# Patient Record
Sex: Female | Born: 1965
Health system: Southern US, Community
[De-identification: ages and names within clinical notes are randomized; demographics above are authoritative.]

## PROBLEM LIST (undated history)

## (undated) DIAGNOSIS — Z8669 Personal history of other diseases of the nervous system and sense organs: Secondary | ICD-10-CM

## (undated) DIAGNOSIS — G459 Transient cerebral ischemic attack, unspecified: Secondary | ICD-10-CM

## (undated) DIAGNOSIS — D649 Anemia, unspecified: Secondary | ICD-10-CM

## (undated) DIAGNOSIS — F329 Major depressive disorder, single episode, unspecified: Secondary | ICD-10-CM

## (undated) DIAGNOSIS — T7840XA Allergy, unspecified, initial encounter: Secondary | ICD-10-CM

## (undated) DIAGNOSIS — J189 Pneumonia, unspecified organism: Secondary | ICD-10-CM

## (undated) DIAGNOSIS — Z8489 Family history of other specified conditions: Secondary | ICD-10-CM

## (undated) DIAGNOSIS — F32A Depression, unspecified: Secondary | ICD-10-CM

## (undated) DIAGNOSIS — F419 Anxiety disorder, unspecified: Secondary | ICD-10-CM

## (undated) DIAGNOSIS — C801 Malignant (primary) neoplasm, unspecified: Secondary | ICD-10-CM

## (undated) DIAGNOSIS — L209 Atopic dermatitis, unspecified: Secondary | ICD-10-CM

## (undated) DIAGNOSIS — K219 Gastro-esophageal reflux disease without esophagitis: Secondary | ICD-10-CM

## (undated) DIAGNOSIS — Z9221 Personal history of antineoplastic chemotherapy: Secondary | ICD-10-CM

## (undated) DIAGNOSIS — N6019 Diffuse cystic mastopathy of unspecified breast: Secondary | ICD-10-CM

## (undated) HISTORY — DX: Allergy, unspecified, initial encounter: T78.40XA

## (undated) HISTORY — DX: Gastro-esophageal reflux disease without esophagitis: K21.9

## (undated) HISTORY — DX: Diffuse cystic mastopathy of unspecified breast: N60.19

## (undated) HISTORY — DX: Anemia, unspecified: D64.9

## (undated) HISTORY — DX: Atopic dermatitis, unspecified: L20.9

---

## 1999-09-30 ENCOUNTER — Other Ambulatory Visit: Admission: RE | Admit: 1999-09-30 | Discharge: 1999-09-30 | Payer: Self-pay | Admitting: Family Medicine

## 2000-08-30 HISTORY — PX: MYOMECTOMY: SHX85

## 2001-04-19 ENCOUNTER — Other Ambulatory Visit: Admission: RE | Admit: 2001-04-19 | Discharge: 2001-04-19 | Payer: Self-pay | Admitting: Family Medicine

## 2001-09-14 ENCOUNTER — Inpatient Hospital Stay (HOSPITAL_COMMUNITY): Admission: RE | Admit: 2001-09-14 | Discharge: 2001-09-16 | Payer: Self-pay | Admitting: Obstetrics and Gynecology

## 2001-09-14 ENCOUNTER — Encounter (INDEPENDENT_AMBULATORY_CARE_PROVIDER_SITE_OTHER): Payer: Self-pay | Admitting: Specialist

## 2002-04-27 ENCOUNTER — Other Ambulatory Visit: Admission: RE | Admit: 2002-04-27 | Discharge: 2002-04-27 | Payer: Self-pay | Admitting: Family Medicine

## 2003-07-24 ENCOUNTER — Other Ambulatory Visit: Admission: RE | Admit: 2003-07-24 | Discharge: 2003-07-24 | Payer: Self-pay | Admitting: Family Medicine

## 2003-11-06 ENCOUNTER — Encounter: Admission: RE | Admit: 2003-11-06 | Discharge: 2003-11-06 | Payer: Self-pay | Admitting: Family Medicine

## 2004-05-10 ENCOUNTER — Emergency Department (HOSPITAL_COMMUNITY): Admission: EM | Admit: 2004-05-10 | Discharge: 2004-05-10 | Payer: Self-pay | Admitting: Emergency Medicine

## 2004-08-16 ENCOUNTER — Other Ambulatory Visit: Admission: RE | Admit: 2004-08-16 | Discharge: 2004-08-16 | Payer: Self-pay | Admitting: Family Medicine

## 2004-08-17 ENCOUNTER — Other Ambulatory Visit: Admission: RE | Admit: 2004-08-17 | Discharge: 2004-08-17 | Payer: Self-pay | Admitting: Family Medicine

## 2004-08-17 ENCOUNTER — Ambulatory Visit: Payer: Self-pay | Admitting: Family Medicine

## 2004-12-28 ENCOUNTER — Ambulatory Visit: Payer: Self-pay | Admitting: Internal Medicine

## 2005-01-08 ENCOUNTER — Ambulatory Visit: Payer: Self-pay | Admitting: Family Medicine

## 2005-04-26 ENCOUNTER — Ambulatory Visit: Payer: Self-pay | Admitting: Family Medicine

## 2005-04-28 ENCOUNTER — Ambulatory Visit: Payer: Self-pay | Admitting: Family Medicine

## 2005-06-07 ENCOUNTER — Ambulatory Visit: Payer: Self-pay | Admitting: Family Medicine

## 2005-11-03 ENCOUNTER — Encounter: Payer: Self-pay | Admitting: Family Medicine

## 2005-11-03 ENCOUNTER — Other Ambulatory Visit: Admission: RE | Admit: 2005-11-03 | Discharge: 2005-11-03 | Payer: Self-pay | Admitting: Family Medicine

## 2005-11-03 ENCOUNTER — Ambulatory Visit: Payer: Self-pay | Admitting: Family Medicine

## 2005-11-09 ENCOUNTER — Ambulatory Visit: Payer: Self-pay | Admitting: Family Medicine

## 2005-11-12 ENCOUNTER — Ambulatory Visit: Payer: Self-pay | Admitting: Family Medicine

## 2006-02-10 ENCOUNTER — Ambulatory Visit: Payer: Self-pay | Admitting: Family Medicine

## 2006-02-23 ENCOUNTER — Encounter: Payer: Self-pay | Admitting: Family Medicine

## 2006-03-23 ENCOUNTER — Encounter: Payer: Self-pay | Admitting: Family Medicine

## 2006-03-30 ENCOUNTER — Encounter: Admission: RE | Admit: 2006-03-30 | Discharge: 2006-03-30 | Payer: Self-pay | Admitting: Surgery

## 2006-03-30 ENCOUNTER — Encounter: Payer: Self-pay | Admitting: Family Medicine

## 2006-12-02 ENCOUNTER — Ambulatory Visit: Payer: Self-pay | Admitting: Family Medicine

## 2008-10-15 ENCOUNTER — Encounter: Payer: Self-pay | Admitting: Family Medicine

## 2008-10-15 ENCOUNTER — Other Ambulatory Visit: Admission: RE | Admit: 2008-10-15 | Discharge: 2008-10-15 | Payer: Self-pay | Admitting: Family Medicine

## 2008-10-15 ENCOUNTER — Ambulatory Visit: Payer: Self-pay | Admitting: Family Medicine

## 2008-10-15 DIAGNOSIS — M79609 Pain in unspecified limb: Secondary | ICD-10-CM | POA: Insufficient documentation

## 2008-10-15 DIAGNOSIS — Z87898 Personal history of other specified conditions: Secondary | ICD-10-CM | POA: Insufficient documentation

## 2008-10-15 DIAGNOSIS — L723 Sebaceous cyst: Secondary | ICD-10-CM | POA: Insufficient documentation

## 2008-10-15 DIAGNOSIS — R109 Unspecified abdominal pain: Secondary | ICD-10-CM | POA: Insufficient documentation

## 2008-10-15 DIAGNOSIS — L2089 Other atopic dermatitis: Secondary | ICD-10-CM | POA: Insufficient documentation

## 2008-10-15 LAB — CONVERTED CEMR LAB
Bilirubin Urine: NEGATIVE
Glucose, Urine, Semiquant: NEGATIVE
Ketones, urine, test strip: NEGATIVE
Nitrite: NEGATIVE
Protein, U semiquant: NEGATIVE
Specific Gravity, Urine: 1.01
Urobilinogen, UA: NEGATIVE
WBC Urine, dipstick: NEGATIVE
pH: 6

## 2008-10-16 ENCOUNTER — Encounter (INDEPENDENT_AMBULATORY_CARE_PROVIDER_SITE_OTHER): Payer: Self-pay | Admitting: *Deleted

## 2008-10-16 ENCOUNTER — Encounter: Payer: Self-pay | Admitting: Family Medicine

## 2008-10-22 ENCOUNTER — Encounter: Payer: Self-pay | Admitting: Family Medicine

## 2008-10-29 ENCOUNTER — Telehealth (INDEPENDENT_AMBULATORY_CARE_PROVIDER_SITE_OTHER): Payer: Self-pay | Admitting: *Deleted

## 2008-10-29 ENCOUNTER — Encounter: Payer: Self-pay | Admitting: Family Medicine

## 2008-10-29 ENCOUNTER — Encounter (INDEPENDENT_AMBULATORY_CARE_PROVIDER_SITE_OTHER): Payer: Self-pay | Admitting: *Deleted

## 2008-10-29 LAB — CONVERTED CEMR LAB
ALT: 15 units/L (ref 0–35)
AST: 21 units/L (ref 0–37)
Albumin: 4.2 g/dL (ref 3.5–5.2)
Alkaline Phosphatase: 31 units/L — ABNORMAL LOW (ref 39–117)
BUN: 9 mg/dL (ref 6–23)
Basophils Absolute: 0 10*3/uL (ref 0.0–0.1)
Basophils Relative: 0.3 % (ref 0.0–3.0)
Bilirubin, Direct: 0.1 mg/dL (ref 0.0–0.3)
CO2: 26 meq/L (ref 19–32)
Calcium: 8.8 mg/dL (ref 8.4–10.5)
Chloride: 104 meq/L (ref 96–112)
Cholesterol: 140 mg/dL (ref 0–200)
Creatinine, Ser: 0.7 mg/dL (ref 0.4–1.2)
Eosinophils Absolute: 0.1 10*3/uL (ref 0.0–0.7)
Eosinophils Relative: 2.7 % (ref 0.0–5.0)
GFR calc Af Amer: 119 mL/min
GFR calc non Af Amer: 98 mL/min
Glucose, Bld: 81 mg/dL (ref 70–99)
HCT: 40.1 % (ref 36.0–46.0)
HDL: 50 mg/dL (ref >39.0)
Hemoglobin: 14 g/dL (ref 12.0–15.0)
LDL Cholesterol: 82 mg/dL (ref 0–99)
Lymphocytes Relative: 81.3 % — ABNORMAL HIGH (ref 12.0–46.0)
MCHC: 35 g/dL (ref 30.0–36.0)
MCV: 85.6 fL (ref 78.0–100.0)
Monocytes Absolute: 0.4 10*3/uL (ref 0.1–1.0)
Monocytes Relative: 8.8 % (ref 3.0–12.0)
Neutro Abs: 0.3 10*3/uL — ABNORMAL LOW (ref 1.4–7.7)
Neutrophils Relative %: 6.9 % — ABNORMAL LOW (ref 43.0–77.0)
Platelets: 212 10*3/uL (ref 150–400)
Potassium: 4 meq/L (ref 3.5–5.1)
RBC: 4.68 M/uL (ref 3.87–5.11)
RDW: 12.6 % (ref 11.5–14.6)
Sodium: 137 meq/L (ref 135–145)
TSH: 2.3 microintl units/mL (ref 0.35–5.50)
Total Bilirubin: 0.8 mg/dL (ref 0.3–1.2)
Total CHOL/HDL Ratio: 2.8
Total Protein: 7.3 g/dL (ref 6.0–8.3)
Triglycerides: 41 mg/dL (ref 0–149)
VLDL: 8 mg/dL (ref 0–40)
WBC: 4.4 10*3/uL — ABNORMAL LOW (ref 4.5–10.5)

## 2008-10-30 ENCOUNTER — Telehealth (INDEPENDENT_AMBULATORY_CARE_PROVIDER_SITE_OTHER): Payer: Self-pay | Admitting: *Deleted

## 2008-10-31 ENCOUNTER — Telehealth: Payer: Self-pay | Admitting: Family Medicine

## 2008-10-31 DIAGNOSIS — R928 Other abnormal and inconclusive findings on diagnostic imaging of breast: Secondary | ICD-10-CM | POA: Insufficient documentation

## 2008-11-18 ENCOUNTER — Telehealth: Payer: Self-pay | Admitting: Family Medicine

## 2009-02-11 ENCOUNTER — Ambulatory Visit: Payer: Self-pay | Admitting: Family Medicine

## 2009-02-11 DIAGNOSIS — M25539 Pain in unspecified wrist: Secondary | ICD-10-CM | POA: Insufficient documentation

## 2009-02-13 ENCOUNTER — Telehealth (INDEPENDENT_AMBULATORY_CARE_PROVIDER_SITE_OTHER): Payer: Self-pay | Admitting: *Deleted

## 2009-02-13 ENCOUNTER — Ambulatory Visit: Payer: Self-pay | Admitting: Family Medicine

## 2009-10-14 ENCOUNTER — Other Ambulatory Visit: Admission: RE | Admit: 2009-10-14 | Discharge: 2009-10-14 | Payer: Self-pay | Admitting: Family Medicine

## 2009-10-14 ENCOUNTER — Ambulatory Visit: Payer: Self-pay | Admitting: Family Medicine

## 2009-10-14 ENCOUNTER — Encounter (INDEPENDENT_AMBULATORY_CARE_PROVIDER_SITE_OTHER): Payer: Self-pay | Admitting: *Deleted

## 2009-10-15 ENCOUNTER — Encounter (INDEPENDENT_AMBULATORY_CARE_PROVIDER_SITE_OTHER): Payer: Self-pay | Admitting: *Deleted

## 2009-10-16 ENCOUNTER — Encounter (INDEPENDENT_AMBULATORY_CARE_PROVIDER_SITE_OTHER): Payer: Self-pay | Admitting: *Deleted

## 2009-10-24 ENCOUNTER — Ambulatory Visit: Payer: Self-pay | Admitting: Family Medicine

## 2009-10-25 LAB — CONVERTED CEMR LAB: Fecal Occult Bld: NEGATIVE

## 2009-11-18 ENCOUNTER — Encounter: Payer: Self-pay | Admitting: Family Medicine

## 2009-12-08 ENCOUNTER — Ambulatory Visit: Payer: Self-pay | Admitting: Family Medicine

## 2009-12-08 DIAGNOSIS — N979 Female infertility, unspecified: Secondary | ICD-10-CM | POA: Insufficient documentation

## 2009-12-30 ENCOUNTER — Ambulatory Visit: Payer: Self-pay | Admitting: Family Medicine

## 2009-12-30 DIAGNOSIS — N926 Irregular menstruation, unspecified: Secondary | ICD-10-CM | POA: Insufficient documentation

## 2009-12-30 DIAGNOSIS — IMO0001 Reserved for inherently not codable concepts without codable children: Secondary | ICD-10-CM | POA: Insufficient documentation

## 2009-12-30 LAB — CONVERTED CEMR LAB
Beta hcg, urine, semiquantitative: NEGATIVE
Bilirubin Urine: NEGATIVE
Glucose, Urine, Semiquant: NEGATIVE
Ketones, urine, test strip: NEGATIVE
Nitrite: NEGATIVE
Protein, U semiquant: NEGATIVE
Specific Gravity, Urine: 1.02
Urobilinogen, UA: 0.2
WBC Urine, dipstick: NEGATIVE
pH: 6.5

## 2010-01-19 ENCOUNTER — Telehealth (INDEPENDENT_AMBULATORY_CARE_PROVIDER_SITE_OTHER): Payer: Self-pay | Admitting: *Deleted

## 2010-01-20 ENCOUNTER — Ambulatory Visit: Payer: Self-pay | Admitting: Oncology

## 2010-02-04 ENCOUNTER — Encounter: Payer: Self-pay | Admitting: Family Medicine

## 2010-09-20 ENCOUNTER — Encounter: Payer: Self-pay | Admitting: Radiology

## 2010-09-27 LAB — CONVERTED CEMR LAB
ALT: 19 units/L (ref 0–35)
AST: 16 units/L (ref 0–37)
Albumin: 4.1 g/dL (ref 3.5–5.2)
Alkaline Phosphatase: 34 units/L — ABNORMAL LOW (ref 39–117)
BUN: 6 mg/dL (ref 6–23)
Basophils Absolute: 0 10*3/uL (ref 0.0–0.1)
Basophils Relative: 0.6 % (ref 0.0–3.0)
Bilirubin Urine: NEGATIVE
Bilirubin, Direct: 0 mg/dL (ref 0.0–0.3)
CO2: 30 meq/L (ref 19–32)
Calcium: 9 mg/dL (ref 8.4–10.5)
Chloride: 107 meq/L (ref 96–112)
Cholesterol: 141 mg/dL (ref 0–200)
Creatinine, Ser: 0.8 mg/dL (ref 0.4–1.2)
Eosinophils Absolute: 0.1 10*3/uL (ref 0.0–0.7)
Eosinophils Relative: 1.7 % (ref 0.0–5.0)
GFR calc non Af Amer: 100.89 mL/min (ref >60)
Glucose, Bld: 86 mg/dL (ref 70–99)
Glucose, Urine, Semiquant: NEGATIVE
HCT: 42.4 % (ref 36.0–46.0)
HDL: 55.1 mg/dL (ref >39.00)
Hemoglobin: 14 g/dL (ref 12.0–15.0)
Hgb A1c MFr Bld: 5.5 % (ref 4.6–6.5)
Ketones, urine, test strip: NEGATIVE
LDL Cholesterol: 76 mg/dL (ref 0–99)
Lymphocytes Relative: 57.7 % — ABNORMAL HIGH (ref 12.0–46.0)
Lymphs Abs: 3.5 10*3/uL (ref 0.7–4.0)
MCHC: 33 g/dL (ref 30.0–36.0)
MCV: 88.4 fL (ref 78.0–100.0)
Monocytes Absolute: 0.2 10*3/uL (ref 0.1–1.0)
Monocytes Relative: 3.6 % (ref 3.0–12.0)
Neutro Abs: 2.2 10*3/uL (ref 1.4–7.7)
Neutrophils Relative %: 36.4 % — ABNORMAL LOW (ref 43.0–77.0)
Nitrite: NEGATIVE
Pap Smear: NEGATIVE
Platelets: 222 10*3/uL (ref 150.0–400.0)
Potassium: 4 meq/L (ref 3.5–5.1)
Protein, U semiquant: NEGATIVE
RBC: 4.8 M/uL (ref 3.87–5.11)
RDW: 12.3 % (ref 11.5–14.6)
Sodium: 139 meq/L (ref 135–145)
Specific Gravity, Urine: <1.005
TSH: 2.35 microintl units/mL (ref 0.35–5.50)
Total Bilirubin: 0.5 mg/dL (ref 0.3–1.2)
Total CHOL/HDL Ratio: 3
Total Protein: 6.9 g/dL (ref 6.0–8.3)
Triglycerides: 48 mg/dL (ref 0.0–149.0)
Urobilinogen, UA: NEGATIVE
VLDL: 9.6 mg/dL (ref 0.0–40.0)
WBC Urine, dipstick: NEGATIVE
WBC: 6 10*3/uL (ref 4.5–10.5)
pH: 5

## 2010-09-29 NOTE — Letter (Signed)
Summary: [facility]   Imported By: Lanelle Bal 11/04/2008 08:45:46  _____________________________________________________________________  External Attachment:    Type:   Image     Comment:   External Document

## 2010-09-29 NOTE — Letter (Signed)
Summary: [facility]   Imported By: Lanelle Bal 11/04/2008 08:44:24  _____________________________________________________________________  External Attachment:    Type:   Image     Comment:   External Document

## 2010-09-29 NOTE — Letter (Signed)
Summary: Primary Care Consult Scheduled Letter  [place] [facility] [place]  [address] [place], [place] [address]   Phone: [phone]  Fax: [phone]      10/15/2009 MRN: [id number]  [facility] [address] [place], [place]  [address]    Dear Ms. Scicchitano,    We have scheduled an appointment for you.  At the recommendation of Dr. Loreen Freud, we have scheduled you for a Screening Mammogram with [facility] on 10-28-2009 at 7:45am.  Their address is [address], [place]. The office phone number is [phone].  If this appointment day and time is not convenient for you, please feel free to call the office of the doctor you are being referred to at the number listed above and reschedule the appointment.    It is important for you to keep your scheduled appointments. We are here to make sure you are given good patient care.   Thank you,    Renee, Patient Care Coordinator [place] at [facility]

## 2010-09-29 NOTE — Assessment & Plan Note (Signed)
Summary: hip & leg pain before menus cycle/cbs   Vital Signs:  Patient profile:   45 year old female Weight:      156 pounds Pulse rate:   82 / minute Pulse rhythm:   regular BP sitting:   126 / 80  (left arm) Cuff size:   regular  Vitals Entered By: Army Fossa CMA (Dec 30, 2009 11:17 AM) CC: Pt here she c/o cycles being 2-3 days late. Her legs and and thighs have felt very sore.    History of Present Illness: Pt here c/o period being 2 1/2 days late 2 months in a row.  Pt also c/o leg and hip aching that started last night.    Current Medications (verified): 1)  None  Allergies: 1)  ! Pcn  Past History:  Past medical, surgical, family and social histories (including risk factors) reviewed for relevance to current acute and chronic problems.  Past Medical History: Reviewed history from 10/15/2008 and no changes required. Current Problems:  FIBROCYSTIC BREAST DISEASE, HX OF (ICD-V13.9) PREVENTIVE HEALTH CARE (ICD-V70.0) ECZEMA, ATOPIC (ICD-691.8)  Past Surgical History: Reviewed history from 10/15/2008 and no changes required. myomectomy-2002  Family History: Reviewed history from 10/14/2009 and no changes required. Mother-GOUT FATHER-HTN, BORDERLINE DIABETIC Family History Diabetes 1st degree relative Family History Hypertension PGreat Aunt--Breast Cancer  Social History: Reviewed history from 10/14/2009 and no changes required. 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  Alcohol use-no Drug use-no  Review of Systems      See HPI  Physical Exam  General:  Well-developed,well-nourished,in no acute distress; alert,appropriate and cooperative throughout examination Psych:  Oriented X3 and normally interactive.     Impression & Recommendations:  Problem # 1:  IRREGULAR MENSTRUAL CYCLE (ICD-626.4)  Orders: Venipuncture (29562) TLB-BMP (Basic Metabolic Panel-BMET) (80048-METABOL) TLB-CBC Platelet - w/Differential  (85025-CBCD) TLB-TSH (Thyroid Stimulating Hormone) (84443-TSH) TLB-Hepatic/Liver Function Pnl (80076-HEPATIC) T-Vitamin D (25-Hydroxy) (13086-57846)  Discussed medication use.   Problem # 2:  MUSCLE PAIN (ICD-729.1)  Orders: Venipuncture (96295) TLB-BMP (Basic Metabolic Panel-BMET) (80048-METABOL) TLB-CBC Platelet - w/Differential (85025-CBCD) TLB-TSH (Thyroid Stimulating Hormone) (84443-TSH) TLB-Hepatic/Liver Function Pnl (80076-HEPATIC) T-Vitamin D (25-Hydroxy) (28413-24401)  Laboratory Results   Urine Tests    Routine Urinalysis   Color: yellow Appearance: Clear Glucose: negative   (Normal Range: Negative) Bilirubin: negative   (Normal Range: Negative) Ketone: negative   (Normal Range: Negative) Spec. Gravity: 1.020   (Normal Range: 1.003-1.035) Blood: large   (Normal Range: Negative) pH: 6.5   (Normal Range: 5.0-8.0) Protein: negative   (Normal Range: Negative) Urobilinogen: 0.2   (Normal Range: 0-1) Nitrite: negative   (Normal Range: Negative) Leukocyte Esterace: negative   (Normal Range: Negative)    Urine HCG: negative Comments: Army Fossa CMA  Dec 30, 2009 11:27 AM, Pt is on period.

## 2010-09-29 NOTE — Progress Notes (Signed)
Summary: mammogram  Phone Note Outgoing Call   Call placed by: Army Fossa CMA,  Jan 19, 2010 11:12 AM Summary of Call: Regarding mammogram, LMTCB:  call pt ---she needs to call [facility] back Signed by Loreen Freud DO on 01/19/2010 at 10:24 AM  Follow-up for Phone Call        [facility]. Army Fossa CMA  Jan 20, 2010 1:20 PM   Additional Follow-up for Phone Call Additional follow up Details #1::        Pt is aware. Army Fossa CMA  Jan 21, 2010 9:22 AM

## 2010-09-29 NOTE — Assessment & Plan Note (Signed)
Summary: wrist hurting/mu   Vital Signs:  Patient profile:   45 year old female Weight:      160.13 pounds Temp:     98.3 degrees F oral Pulse rate:   72 / minute Resp:     16 per minute BP sitting:   112 / 80  (left arm)  Vitals Entered By: Ardyth Man (February 11, 2009 10:56 AM) CC: right wrist hurting since Wednesday Is Patient Diabetic? No  Have you ever been in a relationship where you felt threatened, hurt or afraid?No   Does patient need assistance? Functional Status Self care Ambulation Normal   History of Present Illness: Pt here c/o R wrist pain for a while.  Pt has had multiple injuries over the last year or so. Pt got out her wrist splint and started wearing it last week.  It is better now.    Current Medications (verified): 1)  Naprosyn 500 Mg Tabs (Naproxen) .Marland Kitchen.. 1 By Mouth Two Times A Day  Allergies (verified): 1)  ! Pcn  Past History:  Past medical, surgical, family and social histories (including risk factors) reviewed, and no changes noted (except as noted below).  Past Medical History: Reviewed history from 10/15/2008 and no changes required. Current Problems:  FIBROCYSTIC BREAST DISEASE, HX OF (ICD-V13.9) PREVENTIVE HEALTH CARE (ICD-V70.0) ECZEMA, ATOPIC (ICD-691.8)  Past Surgical History: Reviewed history from 10/15/2008 and no changes required. myomectomy-2002  Family History: Reviewed history from 10/15/2008 and no changes required. Mother-GOUT FATHER-HTN, BORDERLINE DIABETIC  Social History: Reviewed history from 10/15/2008 and no changes required. 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  NON SMOKER NON DRINKER NON DRUG USER  Review of Systems      See HPI  Physical Exam  General:  Well-developed,well-nourished,in no acute distress; alert,appropriate and cooperative throughout examination Msk:  normal ROM, no joint swelling, no joint warmth, no redness over joints, no joint deformities, and  joint tenderness.   Pulses:  R radial normal and L radial normal.   Skin:  Intact without suspicious lesions or rashes Cervical Nodes:  No lymphadenopathy noted Psych:  Cognition and judgment appear intact. Alert and cooperative with normal attention span and concentration. No apparent delusions, illusions, hallucinations   Impression & Recommendations:  Problem # 1:  WRIST PAIN, RIGHT (ICD-719.43) naprosyn 500 two times a day  con't splint refer ortho if no better Orders: T-Wrist Comp Right (73110TC)  Complete Medication List: 1)  Naprosyn 500 Mg Tabs (Naproxen) .Marland Kitchen.. 1 by mouth two times a day Prescriptions: NAPROSYN 500 MG TABS (NAPROXEN) 1 by mouth two times a day  #30 x 1   Entered and Authorized by:   Loreen Freud DO   Signed by:   Loreen Freud DO on 02/11/2009   Method used:   Print then Give to Patient   RxID:   [id number]

## 2010-09-29 NOTE — Assessment & Plan Note (Signed)
Summary: cpx & lab/cbs   Vital Signs:  Patient Profile:   45 Years Old Female Weight:      158.13 pounds Temp:     98.2 degrees F oral Pulse rate:   72 / minute Resp:     14 per minute BP sitting:   102 / 68  (right arm)  Pt. in pain?   no  Vitals Entered By: Ardyth Man (October 15, 2008 12:47 PM)               Vision Screening: Left eye with correction: 20 / 20 Right eye with correction: 20 / 20 Both eyes with correction: 20 / 20        Vision Entered By: Ardyth Man (October 15, 2008 12:51 PM)   PCP:  Laury Axon  Chief Complaint:  CPX-Fasting Labs-Pap.  History of Present Illness: Pt here for CPE and pap with labs.   Pt has a cyst on her back that has been growing over the last year ---no pain, Pt also c/o L thumb pain with bending on occassion but not all the time.  To take something is not beneficial because pain is gone as soon as she stops bending it.  Pt also c/o sharp pains in lower abd at night only --occurs about every other month---occured this am and expecting period next week.   Lastly pt has reoccuring rash on neck---1st occurred when she was in [employer] and she was given a cream and it went away but it keeps coming back.     Current Allergies: ! PCN  Past Medical History:    Reviewed history and no changes required:       Current Problems:        FIBROCYSTIC BREAST DISEASE, HX OF (ICD-V13.9)       PREVENTIVE HEALTH CARE (ICD-V70.0)       ECZEMA, ATOPIC (ICD-691.8)         Past Surgical History:    Reviewed history and no changes required:       myomectomy-2002   Family History:    Reviewed history and no changes required:       Mother-GOUT       FATHER-HTN, BORDERLINE DIABETIC  Social History:    Reviewed history and no changes required:       Patient has never smoked.       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        NON SMOKER       NON DRINKER       NON DRUG USER   Risk Factors:  Tobacco use:   never Passive smoke exposure:  no Drug use:  no HIV high-risk behavior:  no Caffeine use:  0 drinks per day Alcohol use:  no Exercise:  no Seatbelt use:  100 % Sun Exposure:  occasionally  Family History Risk Factors:    Family History of MI in females < 30 years old:  no    Family History of MI in males < 82 years old:  no   Review of Systems      See HPI  The patient denies anorexia, fever, weight loss, weight gain, vision loss, decreased hearing, hoarseness, chest pain, syncope, dyspnea on exertion, peripheral edema, prolonged cough, headaches, hemoptysis, abdominal pain, melena, hematochezia, severe indigestion/heartburn, hematuria, incontinence, genital sores, muscle weakness, suspicious skin lesions, transient blindness, difficulty walking, depression, unusual weight change, abnormal bleeding, enlarged lymph nodes, angioedema, breast masses, and testicular masses.  General      Denies chills, fatigue, fever, loss of appetite, malaise, sleep disorder, sweats, weakness, and weight loss.  Eyes      Denies blurring, discharge, double vision, eye irritation, eye pain, halos, itching, light sensitivity, red eye, vision loss-1 eye, and vision loss-both eyes.      optho-- q2y  ENT      Denies decreased hearing, difficulty swallowing, ear discharge, earache, hoarseness, nasal congestion, nosebleeds, postnasal drainage, ringing in ears, sinus pressure, and sore throat.      dentist--q9m  CV      Denies bluish discoloration of lips or nails, chest pain or discomfort, difficulty breathing at night, difficulty breathing while lying down, fainting, fatigue, leg cramps with exertion, lightheadness, near fainting, palpitations, shortness of breath with exertion, swelling of feet, swelling of hands, and weight gain.  Resp      Denies chest discomfort, chest pain with inspiration, cough, coughing up blood, excessive snoring, hypersomnolence, morning headaches, pleuritic, shortness of breath,  sputum productive, and wheezing.  GI      Denies abdominal pain, bloody stools, change in bowel habits, constipation, dark tarry stools, diarrhea, excessive appetite, gas, hemorrhoids, indigestion, loss of appetite, nausea, vomiting, vomiting blood, and yellowish skin color.  GU      Denies abnormal vaginal bleeding, decreased libido, discharge, dysuria, genital sores, hematuria, incontinence, nocturia, urinary frequency, and urinary hesitancy.  MS      Denies joint pain, joint redness, joint swelling, loss of strength, low back pain, mid back pain, muscle aches, muscle , cramps, muscle weakness, stiffness, and thoracic pain.  Derm      Denies changes in color of skin, changes in nail beds, dryness, excessive perspiration, flushing, hair loss, insect bite(s), itching, lesion(s), poor wound healing, and rash.      + sebaceous cyst  Neuro      Denies brief paralysis, difficulty with concentration, disturbances in coordination, falling down, headaches, inability to speak, memory loss, numbness, poor balance, seizures, sensation of room spinning, tingling, tremors, visual disturbances, and weakness.  Psych      Denies alternate hallucination ( auditory/visual), anxiety, depression, easily angered, easily tearful, irritability, mental problems, panic attacks, sense of great danger, suicidal thoughts/plans, thoughts of violence, unusual visions or sounds, and thoughts /plans of harming others.  Endo      Denies cold intolerance, excessive hunger, excessive thirst, excessive urination, heat intolerance, polyuria, and weight change.  Heme      Denies abnormal bruising, bleeding, enlarge lymph nodes, fevers, pallor, and skin discoloration.  Allergy      Denies hives or rash, itching eyes, persistent infections, seasonal allergies, and sneezing.   Physical Exam  General:     Well-developed,well-nourished,in no acute distress; alert,appropriate and cooperative throughout examination Head:      Normocephalic and atraumatic without obvious abnormalities. No apparent alopecia or balding. Eyes:     vision grossly intact, pupils equal, pupils round, pupils reactive to light, and no injection.   Ears:     External ear exam shows no significant lesions or deformities.  Otoscopic examination reveals clear canals, tympanic membranes are intact bilaterally without bulging, retraction, inflammation or discharge. Hearing is grossly normal bilaterally. Nose:     External nasal examination shows no deformity or inflammation. Nasal mucosa are pink and moist without lesions or exudates. Mouth:     Oral mucosa and oropharynx without lesions or exudates.  Teeth in good repair. Neck:     No deformities, masses, or tenderness noted.no carotid bruits.  Chest Wall:     No deformities, masses, or tenderness noted. Breasts:     No mass, nodules, thickening, tenderness, bulging, retraction, inflamation, nipple discharge or skin changes noted.   Lungs:     Normal respiratory effort, chest expands symmetrically. Lungs are clear to auscultation, no crackles or wheezes. Heart:     normal rate, regular rhythm, and no murmur.   Abdomen:     Bowel sounds positive,abdomen soft and non-tender without masses, organomegaly or hernias noted. Rectal:     No external abnormalities noted. Normal sphincter tone. No rectal masses or tenderness. Genitalia:     Pelvic Exam:        External: normal female genitalia without lesions or masses        Vagina: normal without lesions or masses        Cervix: normal without lesions or masses        Adnexa: normal bimanual exam without masses or fullness        Uterus: normal by palpation        Pap smear: performed Msk:     normal ROM, no joint tenderness, no joint swelling, no joint warmth, no redness over joints, no joint deformities, no joint instability, and no crepitation.   Pulses:     R posterior tibial normal, R dorsalis pedis normal, R carotid normal, L  posterior tibial normal, L dorsalis pedis normal, and L carotid normal.   Extremities:     No clubbing, cyanosis, edema, or deformity noted with normal full range of motion of all joints.   Neurologic:     No cranial nerve deficits noted. Station and gait are normal. Plantar reflexes are down-going bilaterally. DTRs are symmetrical throughout. Sensory, motor and coordinative functions appear intact. Skin:     Mid back + sebaceous cyst no sign infection Cervical Nodes:     No lymphadenopathy noted Axillary Nodes:     No palpable lymphadenopathy Psych:     Cognition and judgment appear intact. Alert and cooperative with normal attention span and concentration. No apparent delusions, illusions, hallucinations    Impression & Recommendations:  Problem # 1:  PREVENTIVE HEALTH CARE (ICD-V70.0) ghm utd screening mammo stool cards sbe  Orders: Venipuncture (45409) TLB-Lipid Panel (80061-LIPID) TLB-BMP (Basic Metabolic Panel-BMET) (80048-METABOL) TLB-CBC Platelet - w/Differential (85025-CBCD) TLB-Hepatic/Liver Function Pnl (80076-HEPATIC) TLB-TSH (Thyroid Stimulating Hormone) (84443-TSH) EKG w/ Interpretation (93000) Radiology Referral (Radiology) UA Dipstick w/o Micro (manual) (81191)   Problem # 2:  ECZEMA, ATOPIC (ICD-691.8)  Her updated medication list for this problem includes:    Elocon 0.1 % Lotn (Mometasone furoate) .Marland Kitchen... Apply once daily for 2 weeks Discussed use of medication and avoidance of irritating agents. Also stressed importance of moisturizers.   Problem # 3:  THUMB PAIN, LEFT (ICD-729.5)  Orders: Ankle / Wrist Splint (A4570)   Problem # 4:  ABDOMINAL PAIN (ICD-789.00) ? ovulation pt prefers to wait on any further tests she will call if any problems  Problem # 5:  SEBACEOUS CYST (ICD-706.2) warm compresses rto for I&D as needed   Complete Medication List: 1)  Elocon 0.1 % Lotn (Mometasone furoate) .... Apply once daily for 2  weeks    Prescriptions: ELOCON 0.1 % LOTN (MOMETASONE FUROATE) apply once daily for 2 weeks  #1 bottle x 2   Entered and Authorized by:   Loreen Freud DO   Signed by:   Loreen Freud DO on 10/15/2008   Method used:   Electronically to  [facility] [phone]* (retail)       [address]       [place], [place]  [address]       Ph: [phone] or [phone]       Fax: [phone]   RxID:   [id number]    EKG  Procedure date:  10/15/2008  Findings:      sinus rhythm---- 76 bpm    EKG  Procedure date:  10/15/2008  Findings:      sinus rhythm---- 76 bpm     Tetanus/Td Immunization History:    Tetanus/Td # 1:  Td (07/24/2003)  Laboratory Results   Urine Tests   Date/Time Reported: October 15, 2008 2:29 PM   Routine Urinalysis   Color: yellow Appearance: Clear Glucose: negative   (Normal Range: Negative) Bilirubin: negative   (Normal Range: Negative) Ketone: negative   (Normal Range: Negative) Spec. Gravity: 1.010   (Normal Range: 1.003-1.035) Blood: trace-lysed   (Normal Range: Negative) pH: 6.0   (Normal Range: 5.0-8.0) Protein: negative   (Normal Range: Negative) Urobilinogen: negative   (Normal Range: 0-1) Nitrite: negative   (Normal Range: Negative) Leukocyte Esterace: negative   (Normal Range: Negative)    Comments: Floydene Flock CMA  October 15, 2008 2:30 PM

## 2010-09-29 NOTE — Progress Notes (Signed)
  Phone Note Outgoing Call   Call placed by: Ardyth Man,  October 30, 2008 12:36 PM Call placed to: Specialist Summary of Call: [facility] aware Ardyth Man  October 30, 2008 12:36 PM

## 2010-09-29 NOTE — Progress Notes (Signed)
  Phone Note Outgoing Call Call back at [facility] Phone [phone]   Call placed by: Ardyth Man,  February 13, 2009 11:02 AM Call placed to: Patient Summary of Call: normal wrist xray-patient aware Ardyth Man  February 13, 2009 11:03 AM

## 2010-09-29 NOTE — Letter (Signed)
Summary: Primary Care Consult Scheduled Letter  [place] [facility] [place]  [address] [place], [place] [address]   Phone: [phone]  Fax: [phone]      10/16/2008 MRN: [id number]  [facility] [place] [address] [place], [place]  [address]    Dear Ms. Hladik,      We have scheduled an appointment for you.  At the recommendation of Dr. Loreen Freud, we have scheduled you a consult with [facility]) on 10-22-08 at 11:00am.  Their address is [address] [place] [address]. The office phone number is [phone].  If this appointment day and time is not convenient for you, please feel free to call the office of the doctor you are being referred to at the number listed above and reschedule the appointment.     It is important for you to keep your scheduled appointments. We are here to make sure you are given good patient care. If you have questions or you have made changes to your appointment, please notify us at  [phone], ask for Renee.    Thank you,  Patient Care Coordinator [place] at [facility]

## 2010-09-29 NOTE — Letter (Signed)
Summary: Results Follow-up Letter  [place] [facility]  [address] [place], [place] [address]   Phone: [phone]  Fax: [phone]    10/29/2008        Nancy [name] [address] [place], [place]  [address]  Dear [name],   The following are the results of your recent test(s):  Test     Result     Pap Smear    Normal_________________________________________________________ _________________________________________________________ _________________________________________________________  Sincerely,  Ardyth Man [place] at [facility]

## 2010-09-29 NOTE — Progress Notes (Signed)
Summary: [facility] 10/31/08  Phone Note Outgoing Call Call back at Home Phone [phone]   Call placed by: Ardyth Man,  October 31, 2008 2:21 PM Call placed to: Patient Summary of Call: Left message for patient to call the office before 5pm today per Dr. Laury Axon patient may call Shanda Bumps at [phone] to schedule biopsy. Ardyth Man  October 31, 2008 2:22 PM   Follow-up for Phone Call        Radiologist called back ---suggests repeating MRI first and then doing biopsy if needed.  please inform pt ---we will schedule mri Follow-up by: Loreen Freud DO,  November 01, 2008 3:04 PM  Additional Follow-up for Phone Call Additional follow up Details #1::        Patient left message will call back around 4:30 to 5:00 works 3 rd shift.  Ardyth Man  November 07, 2008 10:34 AM  Additional Follow-up by: Ardyth Man,  November 07, 2008 10:34 AM  New Problems: MAMMOGRAM, ABNORMAL (ICD-793.80)   Additional Follow-up for Phone Call Additional follow up Details #2::    Patient refuses mri and biopsy, that she feels that everything is still the same.  Another doctor told her that done an [place] on her told her that nothing has changed.  Patient would like to just have this monitored.  Patient would like to have a digital mammogram done in 6 months to see if there is any change.  Ardyth Man  November 08, 2008 5:33 PM Patient aware going against medical advise. Ardyth Man  November 08, 2008 5:34 PM  Follow-up by: Ardyth Man,  November 08, 2008 5:34 PM  Additional Follow-up for Phone Call Additional follow up Details #3:: Details for Additional Follow-up Action Taken: noted Additional Follow-up by: Loreen Freud DO,  November 08, 2008 5:37 PM  New Problems: MAMMOGRAM, ABNORMAL (ICD-793.80)

## 2010-09-29 NOTE — Letter (Signed)
Summary: [facility]   Imported By: Lanelle Bal 02/20/2010 13:13:37  _____________________________________________________________________  External Attachment:    Type:   Image     Comment:   External Document

## 2010-09-29 NOTE — Assessment & Plan Note (Signed)
Summary: cpx- jr and fasting labs///sph   Vital Signs:  Patient profile:   45 year old female Height:      65.5 inches Weight:      157 pounds BMI:     25.82 Temp:     98.2 degrees F oral Pulse rate:   76 / minute Pulse rhythm:   regular BP sitting:   116 / 82  (left arm) Cuff size:   regular  Vitals Entered By: Army Fossa CMA (October 14, 2009 8:34 AM) CC: CPX, pap, labs   History of Present Illness: Pt here cpe, pap and labs.  No complaints.    FDLMP  10/05/2009  Pt having issues with staying awake on her days off---she works 3rd shift.  She does not want medication.     Preventive Screening-Counseling & Management  Alcohol-Tobacco     Alcohol drinks/day: <1     Alcohol type: wine     Smoking Status: never     Passive Smoke Exposure: no  Caffeine-Diet-Exercise     Caffeine use/day: 1     Does Patient Exercise: no     Exercise Counseling: to improve exercise regimen  Hep-HIV-STD-Contraception     HIV Risk: no     Dental Visit-last 6 months yes     Dental Care Counseling: not indicated; dental care within six months     SBE monthly: yes     SBE Education/Counseling: not indicated; SBE done regularly     Sun Exposure-Excessive: occasionally  Safety-Violence-Falls     Seat Belt Use: 100      Sexual History:  currently monogamous and married.        Drug Use:  no.    Current Medications (verified): 1)  None  Allergies: 1)  ! Pcn  Past History:  Past Medical History: Last updated: 10/15/2008 Current Problems:  FIBROCYSTIC BREAST DISEASE, HX OF (ICD-V13.9) PREVENTIVE HEALTH CARE (ICD-V70.0) ECZEMA, ATOPIC (ICD-691.8)  Past Surgical History: Last updated: 10/15/2008 myomectomy-2002  Family History: Last updated: 10/14/2009 Mother-GOUT FATHER-HTN, BORDERLINE DIABETIC Family History Diabetes 1st degree relative Family History Hypertension PGreat Aunt--Breast Cancer  Social History: Last updated: 10/14/2009 Patient has never smoked. 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  Alcohol use-no Drug use-no  Risk Factors: Alcohol Use: <1 (10/14/2009) Caffeine Use: 1 (10/14/2009) Exercise: no (10/14/2009)  Risk Factors: Smoking Status: never (10/14/2009) Passive Smoke Exposure: no (10/14/2009)  Family History: Reviewed history from 10/15/2008 and no changes required. Mother-GOUT FATHER-HTN, BORDERLINE DIABETIC Family History Diabetes 1st degree relative Family History Hypertension PGreat Aunt--Breast Cancer  Social History: Reviewed history from 10/15/2008 and no changes required. 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  Alcohol use-no Drug use-no Caffeine use/day:  1 Dental Care w/in 6 mos.:  yes Sexual History:  currently monogamous, married  Review of Systems      See HPI General:  Denies chills, fatigue, fever, loss of appetite, malaise, sleep disorder, sweats, weakness, and weight loss. Eyes:  Denies blurring, discharge, double vision, eye irritation, eye pain, halos, itching, light sensitivity, red eye, vision loss-1 eye, and vision loss-both eyes; optho--q2y. ENT:  Denies decreased hearing, difficulty swallowing, ear discharge, earache, hoarseness, nasal congestion, nosebleeds, postnasal drainage, ringing in ears, sinus pressure, and sore throat. CV:  Denies bluish discoloration of lips or nails, chest pain or discomfort, difficulty breathing at night, difficulty breathing while lying down, fainting, fatigue, leg cramps with exertion, lightheadness, near fainting, palpitations, shortness of breath with exertion, swelling of feet,  swelling of hands, and weight gain. Resp:  Denies chest discomfort, chest pain with inspiration, cough, coughing up blood, excessive snoring, hypersomnolence, morning headaches, pleuritic, shortness of breath, sputum productive, and wheezing. GI:  Denies abdominal pain, bloody stools, change in bowel habits, constipation, dark tarry stools,  diarrhea, excessive appetite, gas, hemorrhoids, indigestion, loss of appetite, nausea, vomiting, vomiting blood, and yellowish skin color. GU:  Denies abnormal vaginal bleeding, decreased libido, discharge, dysuria, genital sores, hematuria, incontinence, nocturia, urinary frequency, and urinary hesitancy. MS:  Denies joint pain, joint redness, joint swelling, loss of strength, low back pain, mid back pain, muscle aches, muscle , cramps, muscle weakness, stiffness, and thoracic pain. Derm:  Denies changes in color of skin, changes in nail beds, dryness, excessive perspiration, flushing, hair loss, insect bite(s), itching, lesion(s), poor wound healing, and rash. Neuro:  Denies brief paralysis, difficulty with concentration, disturbances in coordination, falling down, headaches, inability to speak, memory loss, numbness, poor balance, seizures, sensation of room spinning, tingling, tremors, visual disturbances, and weakness. Psych:  Denies alternate hallucination ( auditory/visual), anxiety, depression, easily angered, easily tearful, irritability, mental problems, panic attacks, sense of great danger, suicidal thoughts/plans, thoughts of violence, unusual visions or sounds, and thoughts /plans of harming others. Endo:  Denies cold intolerance, excessive hunger, excessive thirst, excessive urination, heat intolerance, polyuria, and weight change. Heme:  Denies abnormal bruising, bleeding, enlarge lymph nodes, fevers, pallor, and skin discoloration. Allergy:  Denies hives or rash, itching eyes, persistent infections, seasonal allergies, and sneezing.  Physical Exam  General:  Well-developed,well-nourished,in no acute distress; alert,appropriate and cooperative throughout examination Head:  Normocephalic and atraumatic without obvious abnormalities. No apparent alopecia or balding. Eyes:  vision grossly intact, pupils equal, pupils round, pupils reactive to light, and no injection.   Ears:  External ear  exam shows no significant lesions or deformities.  Otoscopic examination reveals clear canals, tympanic membranes are intact bilaterally without bulging, retraction, inflammation or discharge. Hearing is grossly normal bilaterally. Nose:  External nasal examination shows no deformity or inflammation. Nasal mucosa are pink and moist without lesions or exudates. Mouth:  Oral mucosa and oropharynx without lesions or exudates.  Teeth in good repair. Neck:  No deformities, masses, or tenderness noted.no carotid bruits.   Chest Wall:  No deformities, masses, or tenderness noted. Breasts:  No mass, nodules, thickening, tenderness, bulging, retraction, inflamation, nipple discharge or skin changes noted.  FBC changes R UOQ Lungs:  Normal respiratory effort, chest expands symmetrically. Lungs are clear to auscultation, no crackles or wheezes. Heart:  Normal rate and regular rhythm. S1 and S2 normal without gallop, murmur, click, rub or other extra sounds. Abdomen:  Bowel sounds positive,abdomen soft and non-tender without masses, organomegaly or hernias noted. Rectal:  No external abnormalities noted. Normal sphincter tone. No rectal masses or tenderness. heme neg stool Genitalia:  Pelvic Exam:        External: normal female genitalia without lesions or masses        Vagina: normal without lesions or masses        Cervix: normal without lesions or masses        Adnexa: normal bimanual exam without masses or fullness        Uterus: normal by palpation        Pap smear: performed Msk:  normal ROM, no joint tenderness, no joint swelling, no joint warmth, no redness over joints, no joint deformities, no joint instability, and no crepitation.   Pulses:  R posterior tibial normal, R dorsalis pedis normal,  R carotid normal, L posterior tibial normal, L dorsalis pedis normal, and L carotid normal.   Extremities:  No clubbing, cyanosis, edema, or deformity noted with normal full range of motion of all joints.     Neurologic:  alert & oriented X3, cranial nerves II-XII intact, and gait normal.   Skin:  Intact without suspicious lesions or rashes Cervical Nodes:  No lymphadenopathy noted Axillary Nodes:  No palpable lymphadenopathy Psych:  Cognition and judgment appear intact. Alert and cooperative with normal attention span and concentration. No apparent delusions, illusions, hallucinations   Impression & Recommendations:  Problem # 1:  PREVENTIVE HEALTH CARE (ICD-V70.0)  Orders: Radiology Referral (Radiology) Venipuncture (04540) TLB-Lipid Panel (80061-LIPID) TLB-BMP (Basic Metabolic Panel-BMET) (80048-METABOL) TLB-CBC Platelet - w/Differential (85025-CBCD) TLB-Hepatic/Liver Function Pnl (80076-HEPATIC) TLB-TSH (Thyroid Stimulating Hormone) (84443-TSH) TLB-A1C / Hgb A1C (Glycohemoglobin) (83036-A1C) EKG w/ Interpretation (93000) UA Dipstick w/o Micro (manual) (98119)  Problem # 2:  FAMILY HISTORY DIABETES 1ST DEGREE RELATIVE (ICD-V18.0)  Orders: TLB-A1C / Hgb A1C (Glycohemoglobin) (83036-A1C) EKG w/ Interpretation (93000)   EKG  Procedure date:  10/14/2009  Findings:      Normal sinus rhythm with rate of:  71 bpm      Flu Vaccine Next Due:  Refused  Laboratory Results   Urine Tests   Date/Time Reported: October 14, 2009 10:08 AM   Routine Urinalysis   Color: yellow Appearance: Clear Glucose: negative   (Normal Range: Negative) Bilirubin: negative   (Normal Range: Negative) Ketone: negative   (Normal Range: Negative) Spec. Gravity: <1.005   (Normal Range: 1.003-1.035) Blood: large   (Normal Range: Negative) pH: 5.0   (Normal Range: 5.0-8.0) Protein: negative   (Normal Range: Negative) Urobilinogen: negative   (Normal Range: 0-1) Nitrite: negative   (Normal Range: Negative) Leukocyte Esterace: negative   (Normal Range: Negative)    Comments: Floydene Flock  October 14, 2009 10:08 AM cx  not sent/ per Duwayne Heck

## 2010-09-29 NOTE — Letter (Signed)
Summary: [facility]: Immunoassay Fecal Occult Blood (iFOB) Order Form  [facility] [place]  [address] [place], [place] [address]   Phone: [phone]  Fax: [phone]      [name]: Immunoassay Fecal Occult Blood (iFOB) Order Form   October 14, 2009 MRN: [id number]   Nancy [name] 1966/03/31   Physicican Name:___Yvonne Laury Axon, DO______________________  Diagnosis Code:______V76.51____________________      Army Fossa CMA

## 2010-09-29 NOTE — Assessment & Plan Note (Signed)
Summary: discuss having children/kdc   Vital Signs:  Patient profile:   45 year old female Weight:      158.38 pounds Pulse rate:   78 / minute Pulse rhythm:   regular BP sitting:   124 / 78  (left arm) Cuff size:   regular  Vitals Entered By: Army Fossa CMA (December 08, 2009 1:53 PM) CC: Pt here to discuss getting pregnant.   History of Present Illness: Pt is here to discuss having children.  Pt had several ? about conception.   Pt is taking PNV.  Pt would like new OB.  Pt and her husband have been trying to have a child for over a year.  Current Medications (verified): 1)  None  Allergies: 1)  ! Pcn  Past History:  Past medical, surgical, family and social histories (including risk factors) reviewed for relevance to current acute and chronic problems.  Past Medical History: Reviewed history from 10/15/2008 and no changes required. Current Problems:  FIBROCYSTIC BREAST DISEASE, HX OF (ICD-V13.9) PREVENTIVE HEALTH CARE (ICD-V70.0) ECZEMA, ATOPIC (ICD-691.8)  Past Surgical History: Reviewed history from 10/15/2008 and no changes required. myomectomy-2002  Family History: Reviewed history from 10/14/2009 and no changes required. Mother-GOUT FATHER-HTN, BORDERLINE DIABETIC Family History Diabetes 1st degree relative Family History Hypertension PGreat Aunt--Breast Cancer  Social History: Reviewed history from 10/14/2009 and no changes required. 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  Alcohol use-no Drug use-no  Review of Systems      See HPI  Physical Exam  General:  Well-developed,well-nourished,in no acute distress; alert,appropriate and cooperative throughout examination Psych:  Cognition and judgment appear intact. Alert and cooperative with normal attention span and concentration. No apparent delusions, illusions, hallucinations   Impression & Recommendations:  Problem # 1:  FEMALE INFERTILITY (ICD-628.9) pt  to see gyn  take prenatal vitamins

## 2010-09-29 NOTE — Progress Notes (Signed)
Summary: [facility] 10/29/2008  Phone Note Outgoing Call Call back at Home Phone [phone]   Call placed by: Ardyth Man,  October 29, 2008 4:19 PM Call placed to: Patient Summary of Call: Left message for patient to call the office, Mammo has tumor in right breast needs mri or bx. Ardyth Man  October 29, 2008 4:20 PM   Follow-up for Phone Call        patient aware Ardyth Man  October 29, 2008 5:36 PM  Follow-up by: Ardyth Man,  October 29, 2008 5:36 PM

## 2010-09-29 NOTE — Letter (Signed)
Summary: Results Follow up Letter  [facility] [place]  [address] [place], [place] [address]   Phone: [phone]  Fax: [phone]    10/16/2009 MRN: [id number]  [facility] [address] [place], [place]  [address]  Dear Ms. Mctavish,  The following are the results of your recent test(s):  Test         Result    Pap Smear:        Normal _X____  Not Normal _____ Comments: ______________________________________________________ Cholesterol: LDL(Bad cholesterol):         Your goal is less than:         HDL (Good cholesterol):       Your goal is more than: Comments:  ______________________________________________________ Mammogram:        Normal _____  Not Normal _____ Comments:  ___________________________________________________________________ Hemoccult:        Normal _____  Not normal _______ Comments:    _____________________________________________________________________ Other Tests:    We routinely do not discuss normal results over the telephone.  If you desire a copy of the results, or you have any questions about this information we can discuss them at your next office visit.   Sincerely,

## 2010-09-29 NOTE — Progress Notes (Signed)
Summary: Pt declined referral  Phone Note From Other Clinic Call back at [phone]   Call For: lowne,yvonne Summary of Call: This patient was referred to [facility] for MRI breasts on 11-04-08.  Raynelle Fanning is who contacts the pt the set this up per [place], Ph [phone].  Per Raynelle Fanning w/[facility], ph [phone], when she contacted pt to schedule, the pt declined MRI, says she's had no changes & doesn't need the MRI Magdalen Spatz [facility]  November 18, 2008 8:54 AM Initial call taken by: Magdalen Spatz [facility],  November 18, 2008 8:57 AM

## 2010-12-25 ENCOUNTER — Encounter: Payer: Self-pay | Admitting: Family Medicine

## 2010-12-29 ENCOUNTER — Encounter: Payer: Self-pay | Admitting: Family Medicine

## 2010-12-29 ENCOUNTER — Other Ambulatory Visit (HOSPITAL_COMMUNITY)
Admission: RE | Admit: 2010-12-29 | Discharge: 2010-12-29 | Disposition: A | Discharge status: Home / Self Care | Payer: Federal, State, Local not specified - PPO | Source: Ambulatory Visit | Attending: Family Medicine | Admitting: Family Medicine

## 2010-12-29 ENCOUNTER — Ambulatory Visit (INDEPENDENT_AMBULATORY_CARE_PROVIDER_SITE_OTHER): Payer: Federal, State, Local not specified - PPO | Admitting: Family Medicine

## 2010-12-29 VITALS — BP 110/72 | Ht 66.0 in | Wt 156.4 lb

## 2010-12-29 DIAGNOSIS — J189 Pneumonia, unspecified organism: Secondary | ICD-10-CM | POA: Insufficient documentation

## 2010-12-29 DIAGNOSIS — Z0000 Encounter for general adult medical examination without abnormal findings: Secondary | ICD-10-CM

## 2010-12-29 DIAGNOSIS — Z01419 Encounter for gynecological examination (general) (routine) without abnormal findings: Secondary | ICD-10-CM | POA: Insufficient documentation

## 2010-12-29 LAB — BASIC METABOLIC PANEL
BUN: 6 mg/dL (ref 6–23)
CO2: 26 mEq/L (ref 19–32)
Calcium: 9.1 mg/dL (ref 8.4–10.5)
Chloride: 107 mEq/L (ref 96–112)
Creatinine, Ser: 0.6 mg/dL (ref 0.4–1.2)
GFR: 151.41 mL/min (ref >60.00)
Glucose, Bld: 71 mg/dL (ref 70–99)
Potassium: 4 mEq/L (ref 3.5–5.1)
Sodium: 142 mEq/L (ref 135–145)

## 2010-12-29 LAB — HEPATIC FUNCTION PANEL
ALT: 16 U/L (ref 0–35)
AST: 19 U/L (ref 0–37)
Albumin: 3.9 g/dL (ref 3.5–5.2)
Alkaline Phosphatase: 34 U/L — ABNORMAL LOW (ref 39–117)
Bilirubin, Direct: 0.1 mg/dL (ref 0.0–0.3)
Total Bilirubin: 0.7 mg/dL (ref 0.3–1.2)
Total Protein: 6.8 g/dL (ref 6.0–8.3)

## 2010-12-29 LAB — CBC WITH DIFFERENTIAL/PLATELET
Basophils Absolute: 0 10*3/uL (ref 0.0–0.1)
Basophils Relative: 0.5 % (ref 0.0–3.0)
Eosinophils Absolute: 0.1 10*3/uL (ref 0.0–0.7)
Eosinophils Relative: 1.5 % (ref 0.0–5.0)
HCT: 39.3 % (ref 36.0–46.0)
Hemoglobin: 13.3 g/dL (ref 12.0–15.0)
Lymphocytes Relative: 59.1 % — ABNORMAL HIGH (ref 12.0–46.0)
Lymphs Abs: 2.8 10*3/uL (ref 0.7–4.0)
MCHC: 33.9 g/dL (ref 30.0–36.0)
MCV: 86.8 fl (ref 78.0–100.0)
Monocytes Absolute: 0.2 10*3/uL (ref 0.1–1.0)
Monocytes Relative: 4.3 % (ref 3.0–12.0)
Neutro Abs: 1.7 10*3/uL (ref 1.4–7.7)
Neutrophils Relative %: 34.6 % — ABNORMAL LOW (ref 43.0–77.0)
Platelets: 213 10*3/uL (ref 150.0–400.0)
RBC: 4.53 Mil/uL (ref 3.87–5.11)
RDW: 13.6 % (ref 11.5–14.6)
WBC: 4.8 10*3/uL (ref 4.5–10.5)

## 2010-12-29 LAB — LIPID PANEL
Cholesterol: 156 mg/dL (ref 0–200)
HDL: 56.2 mg/dL (ref >39.00)
LDL Cholesterol: 94 mg/dL (ref 0–99)
Total CHOL/HDL Ratio: 3
Triglycerides: 28 mg/dL (ref 0.0–149.0)
VLDL: 5.6 mg/dL (ref 0.0–40.0)

## 2010-12-29 LAB — TSH: TSH: 1.44 u[IU]/mL (ref 0.35–5.50)

## 2010-12-29 NOTE — Progress Notes (Signed)
\'  Subjective:     Nancy [name] is a 45 y.o. female and is here for a comprehensive physical exam. The patient reports getting over pneumonia.  Pt went to [facility] and was told to get xray in 2-3 weeks.  No more symptoms except some chest tenderness in center of chest. .  History   Social History  . Marital Status: Married    Spouse Name: N/A    Number of Children: N/A  . Years of Education: 76   Occupational History  . [employer]   Social History Main Topics  . Smoking status: Never Smoker   . Smokeless tobacco: Never Used  . Alcohol Use: No  . Drug Use: No  . Sexually Active: Yes -- Female partner(s)   Other Topics Concern  . Not on file   Social History Narrative  . No narrative on file   Health Maintenance  Topic Date Due  . Pap Smear  03/24/1985  . Tetanus/tdap  07/23/2013    The following portions of the patient's history were reviewed and updated as appropriate: allergies, current medications, past family history, past medical history, past social history, past surgical history and problem list.  Review of Systems Pertinent items are noted in HPI.   Objective:    BP 110/72  Ht 5\' 6"  (1.676 m)  Wt 156 lb 6.4 oz (70.943 kg)  BMI 25.24 kg/m2  LMP 12/18/2010 General appearance: alert, cooperative, appears stated age and no distress Head: Normocephalic, without obvious abnormality, atraumatic Eyes: conjunctivae/corneas clear. PERRL, EOM's intact. Fundi benign. Ears: normal TM's and external ear canals both ears Nose: Nares normal. Septum midline. Mucosa normal. No drainage or sinus tenderness. Throat: lips, mucosa, and tongue normal; teeth and gums normal Neck: no adenopathy, no carotid bruit, no JVD, supple, symmetrical, trachea midline and thyroid not enlarged, symmetric, no tenderness/mass/nodules Lungs: clear to auscultation bilaterally Breasts: normal appearance, no masses or tenderness Heart: regular rate and rhythm, S1, S2 normal, no  murmur, click, rub or gallop Abdomen: soft, non-tender; bowel sounds normal; no masses,  no organomegaly Pelvic: cervix normal in appearance, external genitalia normal, no adnexal masses or tenderness, no cervical motion tenderness, rectovaginal septum normal, uterus normal size, shape, and consistency and vagina normal without discharge Extremities: extremities normal, atraumatic, no cyanosis or edema Pulses: 2+ and symmetric Skin: Skin color, texture, turgor normal. No rashes or lesions Lymph nodes: Cervical, supraclavicular, and axillary nodes normal. Neurologic: Grossly normal    Assessment:    Healthy female exam.    Plan:     See After Visit Summary for Counseling Recommendations

## 2010-12-29 NOTE — Patient Instructions (Signed)
Get cxr at your earliest convenience con't diet and exercise

## 2010-12-29 NOTE — Assessment & Plan Note (Signed)
Check cxr Symptoms resolved

## 2010-12-30 ENCOUNTER — Ambulatory Visit (HOSPITAL_BASED_OUTPATIENT_CLINIC_OR_DEPARTMENT_OTHER)
Admission: RE | Admit: 2010-12-30 | Discharge: 2010-12-30 | Disposition: A | Discharge status: Home / Self Care | Payer: Federal, State, Local not specified - PPO | Source: Ambulatory Visit | Attending: Family Medicine | Admitting: Family Medicine

## 2010-12-30 ENCOUNTER — Encounter: Payer: Self-pay | Admitting: *Deleted

## 2010-12-30 DIAGNOSIS — R059 Cough, unspecified: Secondary | ICD-10-CM | POA: Insufficient documentation

## 2010-12-30 DIAGNOSIS — J189 Pneumonia, unspecified organism: Secondary | ICD-10-CM

## 2010-12-30 DIAGNOSIS — R05 Cough: Secondary | ICD-10-CM | POA: Insufficient documentation

## 2011-01-15 NOTE — Op Note (Signed)
[facility]  Patient:    Nancy [name], Nancy [name] Visit Number: [id number] MRN: [id number]          Service Type: GYN Location: [phone] Attending Physician:  Marcelle Overlie Dictated by:   Marcelle Overlie, M.D. Proc. Date: 09/14/01 Admit Date:  09/14/2001                             Operative Report  PREOPERATIVE DIAGNOSIS:       Uterine fibroids.  POSTOPERATIVE DIAGNOSIS:      Uterine fibroids.  OPERATION:                    Exploratory laparotomy.  Abdominal myomectomies.  SURGEON:                      Marcelle Overlie, M.D.  ASSISTANT:                    Duke Salvia. Marcelle Overlie, M.D.  ANESTHESIA:                   General.  ESTIMATED BLOOD LOSS:         100 cc.  FINDINGS:                     Two fibroids, one was approximately 8 to 9 cm in the fundal region, and one smaller one about 1 cm.  COMPLICATIONS:                None.  DESCRIPTION OF PROCEDURE:     The patient was taken to the operating room. She was intubated without difficulty.  The abdomen and vagina were prepped and draped in the usual sterile fashion.  A Foley catheter was inserted into the bladder.  A small low transverse incision was made with the scalpel and then carried down to the fascia with good hemostasis.  The fascia was scored in the midline and extended laterally using Mayo scissors.  A Pfannenstiel incision was then created.  The peritoneum was entered bluntly and the peritoneal incision was then stretched.  A self retaining retractor was placed in the abdominal cavity.  The large and small bowel were then placed in the upper abdomen and attention was then turned to the pelvis.  The uterus was noted to be markedly enlarged.  There was one large 9 cm fibroid which was fundal and was not near either fallopian tube.  There was one smaller anterior fibroid which was subserosal approximately 1 cm in length that was near the bladder flap.  We then turned our attention  to the larger fibroid where the fibroid was grasped using Lahey clamp and an approximately a 9 cm incision was made across the fundus and the fibroid was then carefully shelled out using Mayo scissors.  At no point did we enter the endometrial cavity, however, given the extent of this incision, she would be a candidate for a cesarean section if she became pregnant in the future.  The uterus was then closed in four layers using 3-0 Vicryl suture in running locked stitch and then the serosa was closed in a baseball stitch using 4-0 Vicryl suture.  This was inspected and noted to be hemostatic.  Attention was then turned to the smaller anterior fibroid.  Using Metzenbaum scissors, the bladder was then dissected free from the fibroid and the fibroid was then grasped using the  Lahey clamp.  Then it was excised using electrocautery.  The base was then hemostatic after three figure-of-eight sutures using 3-0 Vicryl suture.  Irrigation was performed. Neither surgical site was bleeding.  We then removed all instruments from the abdominal cavity.  The peritoneum was closed using 2-0 Vicryl suture and then just prior to completely closing the peritoneum, Entergel was placed over the surgical sites.  This is used to prevent adhesions in the future.  The remainder of the peritoneum was closed.  The rectus muscles were reapproximated.  The fascia was closed using 0 Vicryl in continuous running stitch x 2 and after irrigation of the subcutaneous layer, the skin was closed with staples.  All sponge, needle, and instrument counts were correct x 2. The patient tolerated the procedure well and went to the recovery room in stable condition. Dictated by:   Marcelle Overlie, M.D. Attending Physician:  Marcelle Overlie DD:  09/14/01 TD:  09/15/01 Job: [id number]

## 2011-01-15 NOTE — Discharge Summary (Signed)
[facility]  Patient:    Nancy [name], Nancy [name] Visit Number: [id number] MRN: [id number]          Service Type: GYN Location: 9300 [id number] Attending Physician:  Marcelle Overlie Dictated by:   Marcelle Overlie, M.D. Admit Date:  09/14/2001 Discharge Date: 09/16/2001                             Discharge Summary  ADMISSION DIAGNOSIS:  Symptomatic uterine fibroids.  DISCHARGE DIAGNOSIS:  Symptomatic uterine fibroids.  HOSPITAL COURSE:  The patient is a 45 year old, gravida 0, with known symptomatic uterine fibroids.  The patient has had a problem with pelvic pain in the past.  She received Depo-Lupron three months ago, and on the day of admission underwent abdominal myomectomies.  The patient did very well postoperatively.  By postoperative day #1, she was tolerating clear diet, she was ambulating, she was voiding, and she was tolerating her pain medication well.  Her postoperative hematocrit was 31.0, postoperative hemoglobin was 9.2.  The patient was discharged home on postoperative day #2, in good condition.  DISCHARGE INSTRUCTIONS:  She was advised no driving for two weeks.  She is advised to call if she has any temperature greater than 100.5, any nausea or vomiting, or return of abdominal pain.  FOLLOWUP:  In the office for a postoperative visit in 4 to 6 weeks. Dictated by:   Marcelle Overlie, M.D. Attending Physician:  Marcelle Overlie DD:  10/09/01 TD:  10/09/01 Job: [id number]

## 2011-06-15 ENCOUNTER — Encounter: Payer: Self-pay | Admitting: Cardiology

## 2011-06-15 ENCOUNTER — Ambulatory Visit (INDEPENDENT_AMBULATORY_CARE_PROVIDER_SITE_OTHER): Payer: Federal, State, Local not specified - PPO | Admitting: Cardiology

## 2011-06-15 DIAGNOSIS — R079 Chest pain, unspecified: Secondary | ICD-10-CM | POA: Insufficient documentation

## 2011-06-15 NOTE — Assessment & Plan Note (Signed)
The concerning thing about her chest pain is that it occurs with exertion. She otherwise is at low-risk for cardiac disease. She has no major cardiac risk factors. Her ECG and exam today are benign. I recommended a stress echo to further evaluate these symptoms. If this is normal then I would pursue treatment of acid reflux disease.

## 2011-06-15 NOTE — Patient Instructions (Addendum)
We will schedule you for a stress Echo study.  Stay on your Pepcid  Your physician has requested that you have a stress echocardiogram. For further information please visit [email or web address] Please follow instruction sheet as given.

## 2011-06-15 NOTE — Progress Notes (Signed)
   Nancy [name] Date of Birth: [birth year] Medical Record #[id number]  History of Present Illness: Nancy [name] is a pleasant 45 year old African American female who is seen at the request of Dr. Laury Axon for evaluation of chest pain. She reports that over the past month after she runs for 10 minutes she will get a burning substernal chest pain. This starts at her lower sternum and then radiates upward. She takes antacids with some relief. She denies any symptoms of palpitations or shortness of breath. There is no radiation of her pain. She does not get this discomfort with any other activity. It is not affected by meals. She has no significant cardiac risk factors.  Current Outpatient Prescriptions on File Prior to Visit  Medication Sig Dispense Refill  . cetirizine (ZYRTEC) 10 MG tablet Take 10 mg by mouth as needed.       . Prenatal Vit-Fe Sulfate-FA (PRENATAL VITAMIN PO) Take 1 tablet by mouth daily.          Allergies  Allergen Reactions  . Penicillins     Past Medical History  Diagnosis Date  . Fibrocystic breast disease   . Atopic eczema     Past Surgical History  Procedure Date  . Myomectomy 2002    History  Smoking status  . Never Smoker   Smokeless tobacco  . Never Used    History  Alcohol Use No    Family History  Problem Relation Age of Onset  . Gout Mother   . Diabetes Mother   . Hypertension Father   . Diabetes Father   . Breast cancer Paternal Aunt     Review of Systems: As noted in history of present illness.  All other systems were reviewed and are negative.  Physical Exam: BP 133/87  Pulse 88  Ht 5\' 5"  (1.651 m)  Wt 155 lb 6.4 oz (70.489 kg)  BMI 25.86 kg/m2 She is a pleasant black female in no acute distress.The patient is alert and oriented x 3.  The mood and affect are normal.  The skin is warm and dry.  Color is normal.  The HEENT exam reveals that the sclera are nonicteric.  The mucous membranes are moist.  The carotids are 2+ without  bruits.  There is no thyromegaly.  There is no JVD.  The lungs are clear.  The chest wall is non tender.  The heart exam reveals a regular rate with a normal S1 and S2.  There are no murmurs, gallops, or rubs.  The PMI is not displaced.   Abdominal exam reveals good bowel sounds.  There is no guarding or rebound.  There is no hepatosplenomegaly or tenderness.  There are no masses.  Exam of the legs reveal no clubbing, cyanosis, or edema.  The legs are without rashes.  The distal pulses are intact.  Cranial nerves II - XII are intact.  Motor and sensory functions are intact.  The gait is normal.  LABORATORY DATA: ECG demonstrates normal sinus rhythm with a normal ECG.  Assessment / Plan:

## 2011-06-23 ENCOUNTER — Ambulatory Visit (HOSPITAL_COMMUNITY): Payer: Federal, State, Local not specified - PPO | Attending: Cardiology

## 2011-06-23 ENCOUNTER — Ambulatory Visit (HOSPITAL_BASED_OUTPATIENT_CLINIC_OR_DEPARTMENT_OTHER): Payer: Federal, State, Local not specified - PPO | Admitting: Radiology

## 2011-06-23 DIAGNOSIS — R5381 Other malaise: Secondary | ICD-10-CM | POA: Insufficient documentation

## 2011-06-23 DIAGNOSIS — R0989 Other specified symptoms and signs involving the circulatory and respiratory systems: Secondary | ICD-10-CM | POA: Insufficient documentation

## 2011-06-23 DIAGNOSIS — R072 Precordial pain: Secondary | ICD-10-CM | POA: Insufficient documentation

## 2011-06-23 DIAGNOSIS — Z8249 Family history of ischemic heart disease and other diseases of the circulatory system: Secondary | ICD-10-CM | POA: Insufficient documentation

## 2011-06-23 DIAGNOSIS — R0609 Other forms of dyspnea: Secondary | ICD-10-CM | POA: Insufficient documentation

## 2011-06-25 ENCOUNTER — Telehealth: Payer: Self-pay | Admitting: *Deleted

## 2011-06-25 NOTE — Telephone Encounter (Signed)
Message copied by Lorayne Bender on Fri Jun 25, 2011  4:09 PM ------      Message from: [place], PETER M      Created: Wed Jun 23, 2011  6:50 PM       Stress Echo is normal. Please reassure.      Theron Arista [place]

## 2011-06-25 NOTE — Telephone Encounter (Signed)
Lm w/echo results. Will send copy to Dr. Laury Axon

## 2011-10-21 ENCOUNTER — Ambulatory Visit (INDEPENDENT_AMBULATORY_CARE_PROVIDER_SITE_OTHER): Payer: Federal, State, Local not specified - PPO | Admitting: Family Medicine

## 2011-10-21 ENCOUNTER — Encounter: Payer: Self-pay | Admitting: Family Medicine

## 2011-10-21 ENCOUNTER — Telehealth: Payer: Self-pay | Admitting: Lab

## 2011-10-21 ENCOUNTER — Other Ambulatory Visit (HOSPITAL_COMMUNITY)
Admission: RE | Admit: 2011-10-21 | Discharge: 2011-10-21 | Disposition: A | Discharge status: Home / Self Care | Payer: Federal, State, Local not specified - PPO | Source: Ambulatory Visit | Attending: Family Medicine | Admitting: Family Medicine

## 2011-10-21 VITALS — BP 110/70 | HR 76 | Temp 99.7°F | Wt 153.0 lb

## 2011-10-21 DIAGNOSIS — Z1239 Encounter for other screening for malignant neoplasm of breast: Secondary | ICD-10-CM

## 2011-10-21 DIAGNOSIS — L309 Dermatitis, unspecified: Secondary | ICD-10-CM

## 2011-10-21 DIAGNOSIS — Z1231 Encounter for screening mammogram for malignant neoplasm of breast: Secondary | ICD-10-CM

## 2011-10-21 DIAGNOSIS — L259 Unspecified contact dermatitis, unspecified cause: Secondary | ICD-10-CM

## 2011-10-21 DIAGNOSIS — Z0000 Encounter for general adult medical examination without abnormal findings: Secondary | ICD-10-CM

## 2011-10-21 DIAGNOSIS — Z01419 Encounter for gynecological examination (general) (routine) without abnormal findings: Secondary | ICD-10-CM | POA: Insufficient documentation

## 2011-10-21 LAB — HEPATIC FUNCTION PANEL
ALT: 19 U/L (ref 0–35)
AST: 22 U/L (ref 0–37)
Albumin: 4.6 g/dL (ref 3.5–5.2)
Alkaline Phosphatase: 35 U/L — ABNORMAL LOW (ref 39–117)
Bilirubin, Direct: 0.1 mg/dL (ref 0.0–0.3)
Total Bilirubin: 1 mg/dL (ref 0.3–1.2)
Total Protein: 7.6 g/dL (ref 6.0–8.3)

## 2011-10-21 LAB — LIPID PANEL
Cholesterol: 139 mg/dL (ref 0–200)
HDL: 59.4 mg/dL (ref >39.00)
LDL Cholesterol: 70 mg/dL (ref 0–99)
Total CHOL/HDL Ratio: 2
Triglycerides: 47 mg/dL (ref 0.0–149.0)
VLDL: 9.4 mg/dL (ref 0.0–40.0)

## 2011-10-21 LAB — BASIC METABOLIC PANEL
BUN: 10 mg/dL (ref 6–23)
CO2: 21 mEq/L (ref 19–32)
Calcium: 9.1 mg/dL (ref 8.4–10.5)
Chloride: 111 mEq/L (ref 96–112)
Creatinine, Ser: 0.6 mg/dL (ref 0.4–1.2)
GFR: 129.3 mL/min (ref >60.00)
Glucose, Bld: 75 mg/dL (ref 70–99)
Potassium: 4 mEq/L (ref 3.5–5.1)
Sodium: 141 mEq/L (ref 135–145)

## 2011-10-21 LAB — CBC WITH DIFFERENTIAL/PLATELET
Basophils Absolute: 0 10*3/uL (ref 0.0–0.1)
Basophils Relative: 0.3 % (ref 0.0–3.0)
Eosinophils Absolute: 0.1 10*3/uL (ref 0.0–0.7)
Eosinophils Relative: 1.8 % (ref 0.0–5.0)
HCT: 43.7 % (ref 36.0–46.0)
Hemoglobin: 14.4 g/dL (ref 12.0–15.0)
Lymphocytes Relative: 47.3 % — ABNORMAL HIGH (ref 12.0–46.0)
Lymphs Abs: 2.6 10*3/uL (ref 0.7–4.0)
MCHC: 32.9 g/dL (ref 30.0–36.0)
MCV: 87.5 fl (ref 78.0–100.0)
Monocytes Absolute: 0.2 10*3/uL (ref 0.1–1.0)
Monocytes Relative: 4.4 % (ref 3.0–12.0)
Neutro Abs: 2.6 10*3/uL (ref 1.4–7.7)
Neutrophils Relative %: 46.2 % (ref 43.0–77.0)
Platelets: 193 10*3/uL (ref 150.0–400.0)
RBC: 4.99 Mil/uL (ref 3.87–5.11)
RDW: 13.9 % (ref 11.5–14.6)
WBC: 5.6 10*3/uL (ref 4.5–10.5)

## 2011-10-21 LAB — TSH: TSH: 0.74 u[IU]/mL (ref 0.35–5.50)

## 2011-10-21 MED ORDER — MOMETASONE FUROATE 0.1 % EX SOLN: Freq: Every day | CUTANEOUS | Status: DC

## 2011-10-21 NOTE — Progress Notes (Signed)
Subjective:     Nancy [name] is a 46 y.o. female and is here for a comprehensive physical exam. The patient reports no problems.  History   Social History  . Marital Status: Married    Spouse Name: N/A    Number of Children: 0  . Years of Education: 54   Occupational History  . [employer]  .     Social History Main Topics  . Smoking status: Never Smoker   . Smokeless tobacco: Never Used  . Alcohol Use: No  . Drug Use: No  . Sexually Active: Yes -- Female partner(s)   Other Topics Concern  . Not on file   Social History Narrative  . No narrative on file   Health Maintenance  Topic Date Due  . Mammogram  03/24/1985  . Influenza Vaccine  05/30/2012  . Tetanus/tdap  07/23/2013  . Pap Smear  12/28/2013    The following portions of the patient's history were reviewed and updated as appropriate: allergies, current medications, past family history, past medical history, past social history, past surgical history and problem list.  Review of Systems Review of Systems  Constitutional: Negative for activity change, appetite change and fatigue.  HENT: Negative for hearing loss, congestion, tinnitus and ear discharge.  dentist q68m Eyes: Negative for visual disturbance (see optho q2y -- vision corrected to 20/20 with glasses).  Respiratory: Negative for cough, chest tightness and shortness of breath.   Cardiovascular: Negative for chest pain, palpitations and leg swelling.  Gastrointestinal: Negative for abdominal pain, diarrhea, constipation and abdominal distention.  Genitourinary: Negative for urgency, frequency, decreased urine volume and difficulty urinating.  Musculoskeletal: Negative for back pain, arthralgias and gait problem.  Skin: Negative for color change, pallor and rash.  Neurological: Negative for dizziness, light-headedness, numbness and headaches.  Hematological: Negative for adenopathy. Does not bruise/bleed easily.  Psychiatric/Behavioral:  Negative for suicidal ideas, confusion, sleep disturbance, self-injury, dysphoric mood, decreased concentration and agitation.       Objective:    BP 110/70  Pulse 76  Temp(Src) 99.7 F (37.6 C) (Oral)  Wt 153 lb (69.4 kg) General appearance: alert, cooperative, appears stated age and no distress Head: Normocephalic, without obvious abnormality, atraumatic Eyes: conjunctivae/corneas clear. PERRL, EOM's intact. Fundi benign. Ears: normal TM's and external ear canals both ears Nose: Nares normal. Septum midline. Mucosa normal. No drainage or sinus tenderness. Throat: lips, mucosa, and tongue normal; teeth and gums normal Neck: no adenopathy, no carotid bruit, no JVD, supple, symmetrical, trachea midline and thyroid not enlarged, symmetric, no tenderness/mass/nodules Back: symmetric, no curvature. ROM normal. No CVA tenderness. Lungs: clear to auscultation bilaterally Breasts: normal appearance, no masses or tenderness Heart: regular rate and rhythm, S1, S2 normal, no murmur, click, rub or gallop Abdomen: soft, non-tender; bowel sounds normal; no masses,  no organomegaly Pelvic: cervix normal in appearance, external genitalia normal, no adnexal masses or tenderness, no cervical motion tenderness, rectovaginal septum normal, uterus normal size, shape, and consistency and vagina normal without discharge Extremities: extremities normal, atraumatic, no cyanosis or edema Pulses: 2+ and symmetric Skin: Skin color, texture, turgor normal. No rashes or lesions Lymph nodes: Cervical, supraclavicular, and axillary nodes normal. Neurologic: Alert and oriented X 3, normal strength and tone. Normal symmetric reflexes. Normal coordination and gait psych-- no depression , anxiety    Assessment:    Healthy female exam.      Plan:    ghm utd Check labs See After Visit Summary for Counseling Recommendations

## 2011-10-21 NOTE — Patient Instructions (Signed)

## 2012-01-01 ENCOUNTER — Emergency Department (HOSPITAL_BASED_OUTPATIENT_CLINIC_OR_DEPARTMENT_OTHER)
Admission: EM | Admit: 2012-01-01 | Discharge: 2012-01-01 | Disposition: A | Discharge status: Home / Self Care | Payer: Federal, State, Local not specified - PPO | Attending: Emergency Medicine | Admitting: Emergency Medicine

## 2012-01-01 ENCOUNTER — Encounter (HOSPITAL_BASED_OUTPATIENT_CLINIC_OR_DEPARTMENT_OTHER): Payer: Self-pay | Admitting: *Deleted

## 2012-01-01 DIAGNOSIS — J3489 Other specified disorders of nose and nasal sinuses: Secondary | ICD-10-CM | POA: Insufficient documentation

## 2012-01-01 DIAGNOSIS — R059 Cough, unspecified: Secondary | ICD-10-CM | POA: Insufficient documentation

## 2012-01-01 DIAGNOSIS — J40 Bronchitis, not specified as acute or chronic: Secondary | ICD-10-CM | POA: Insufficient documentation

## 2012-01-01 DIAGNOSIS — R0602 Shortness of breath: Secondary | ICD-10-CM | POA: Insufficient documentation

## 2012-01-01 DIAGNOSIS — R05 Cough: Secondary | ICD-10-CM | POA: Insufficient documentation

## 2012-01-01 DIAGNOSIS — J029 Acute pharyngitis, unspecified: Secondary | ICD-10-CM | POA: Insufficient documentation

## 2012-01-01 MED ORDER — IPRATROPIUM BROMIDE 0.02 % IN SOLN
0.5000 mg | Freq: Once | RESPIRATORY_TRACT | Status: AC
  Administered 2012-01-01: 0.5 mg via RESPIRATORY_TRACT
  Filled 2012-01-01: qty 2.5

## 2012-01-01 MED ORDER — PSEUDOEPHEDRINE HCL ER 120 MG PO TB12
120.0000 mg | ORAL_TABLET | Freq: Two times a day (BID) | ORAL | Status: DC
  Filled 2012-01-01: qty 1

## 2012-01-01 MED ORDER — ALBUTEROL SULFATE HFA 108 (90 BASE) MCG/ACT IN AERS
INHALATION_SPRAY | RESPIRATORY_TRACT | Status: AC
  Filled 2012-01-01: qty 6.7

## 2012-01-01 MED ORDER — ALBUTEROL SULFATE HFA 108 (90 BASE) MCG/ACT IN AERS: 1.0000 | INHALATION_SPRAY | Freq: Four times a day (QID) | RESPIRATORY_TRACT | Status: DC

## 2012-01-01 MED ORDER — ALBUTEROL SULFATE (5 MG/ML) 0.5% IN NEBU
5.0000 mg | INHALATION_SOLUTION | Freq: Once | RESPIRATORY_TRACT | Status: AC
  Administered 2012-01-01: 5 mg via RESPIRATORY_TRACT
  Filled 2012-01-01: qty 1

## 2012-01-01 MED ORDER — PREDNISONE 10 MG PO TABS
60.0000 mg | ORAL_TABLET | Freq: Once | ORAL | Status: AC
  Administered 2012-01-01: 60 mg via ORAL
  Filled 2012-01-01: qty 1

## 2012-01-01 MED ORDER — OXYMETAZOLINE HCL 0.05 % NA SOLN
1.0000 | Freq: Once | NASAL | Status: AC
  Administered 2012-01-01: 1 via NASAL
  Filled 2012-01-01: qty 15

## 2012-01-01 NOTE — ED Provider Notes (Signed)
History     CSN: [id number]  Arrival date & time 01/01/12  1613   First MD Initiated Contact with Patient 01/01/12 1625      Chief Complaint  Patient presents with  . Shortness of Breath    (Consider location/radiation/quality/duration/timing/severity/associated sxs/prior treatment) HPI History provided by pt.   Pt referred to ED by [facility] for further evaluation of shortness of breath. Pt reports that she has been experiencing head/nasal congestion, rhinorrhea, sore throat and sporadic cough for the past 3 days.  Yesterday afternoon, she developed SOB and a sensation of heaviness on her chest.  Took an OTC allergy medication and sx improved.  Today her sore throat is improved, but congestion persistent and SOB has returned.  Associated w/ elevated temp. Denies CP today.  No FH Mi and does not smoke cigarettes.  No RF for PE and denies LE pain/edema.  Per records reviewed from [facility], CBC unremarkable and CXR neg for pneumonia.  Pt has h/o eczema but no h/o seasonal allergies or asthma and is otherwise healthy.    Past Medical History  Diagnosis Date  . Fibrocystic breast disease   . Atopic eczema     Past Surgical History  Procedure Date  . Myomectomy 2002    Family History  Problem Relation Age of Onset  . Gout Mother   . Diabetes Mother   . Hypertension Father   . Diabetes Father   . Breast cancer Paternal Aunt     History  Substance Use Topics  . Smoking status: Never Smoker   . Smokeless tobacco: Never Used  . Alcohol Use: No    OB History    Grav Para Term Preterm Abortions TAB SAB Ect Mult Living                  Review of Systems  All other systems reviewed and are negative.    Allergies  Penicillins  Home Medications   Current Outpatient Rx  Name Route Sig Dispense Refill  . CETIRIZINE HCL 10 MG PO TABS Oral Take 10 mg by mouth daily as needed. For allergies     . DIPHENHYDRAMINE HCL 25 MG PO TABS Oral Take 25 mg by mouth once as needed.  For allergies    . IBUPROFEN 200 MG PO TABS Oral Take 600 mg by mouth once as needed. For headache    . PRENATAL VITAMIN PO Oral Take 1 tablet by mouth daily.      Marland Kitchen PSEUDOEPH-DOXYLAMINE-DM-APAP 60-12.01-26-999 MG/30ML PO LIQD Oral Take 30 mLs by mouth at bedtime as needed. For cold symptoms    . PSEUDOEPHEDRINE-APAP-DM 60-650-20 MG/30ML PO LIQD Oral Take 30 mLs by mouth daily as needed. For cold symptoms    . VITAMIN C 500 MG PO TABS Oral Take 1,000 mg by mouth daily.      BP 107/66  Pulse 80  Temp 97.9 F (36.6 C)  Resp 18  Ht 5\' 5"  (1.651 m)  Wt 150 lb (68.04 kg)  BMI 24.96 kg/m2  SpO2 100%  LMP 12/08/2011  Physical Exam  Nursing note and vitals reviewed. Constitutional: She is oriented to person, place, and time. She appears well-developed and well-nourished. No distress.  HENT:  Head: Normocephalic and atraumatic. No trismus in the jaw.  Mouth/Throat: Uvula is midline and mucous membranes are normal. No posterior oropharyngeal edema or posterior oropharyngeal erythema.       Large amt of cerumen bilateral ears.  Oropharynx clear. Posterior pharynx erythematous. Tonsils symmetric and w/out edema/exudate.  Uvula mid-line.  No trismus.  Nasal congestion.  Eyes:       Normal appearance  Neck: Normal range of motion. Neck supple.  Cardiovascular: Normal rate and regular rhythm.   Pulmonary/Chest: Effort normal and breath sounds normal. No respiratory distress.       Diffuse inspiratory and expiratory wheezing  Musculoskeletal: Normal range of motion.       No peripheral edema or calf ttp  Lymphadenopathy:    She has no cervical adenopathy.  Neurological: She is alert and oriented to person, place, and time.  Skin: Skin is warm and dry. No rash noted.  Psychiatric: She has a normal mood and affect. Her behavior is normal.    ED Course  Procedures (including critical care time)  Labs Reviewed - No data to display No results found.   1. Bronchitis       MDM  Healthy  45yo F sent from [facility] for further evaluation of SOB.  Neg CXR.  Has had URI sx x 3days and developed intermittent, non-exertional SOB yesterday afternoon.  No fever or respiratory distress, nasal congestion, diffuse inspiratory/expiratory wheezing as well as coughing on exam.  Low risk and low clinical suspicion for ACS and PE.  Suspect bronchitis.  Pt receiving a breathing treatment and sudafed.  Will reassess shortly.    Pt reports that her symptoms are much improved.  On re-examination, decreased coughing and wheezing has resolved.  Pt received an albuterol inhaler and afrin.  Recommended sudafed and ibuprofen/tylenol as well.  She has a PCP to f/u with.  Return precautions discussed.          Otilio Miu, [place] 01/01/12 [phone]

## 2012-01-01 NOTE — ED Provider Notes (Signed)
Medical screening examination/treatment/procedure(s) were performed by non-physician practitioner and as supervising physician I was immediately available for consultation/collaboration.   Draxton Luu Y. Treniece Holsclaw, MD 01/01/12 2314 

## 2012-01-01 NOTE — Discharge Instructions (Signed)
Use albuterol inhaler, 2 puffs every 4 hours, as needed for cough and shortness of breath.  Take sudafed as needed for nasal congestion. You can try the afrin as well, but do not use more than twice a day for the next three days and then discontinue.  Use ibuprofen or tylenol for fever and body aches.  Follow up with your primary care doctor if your symptoms have not started to improve in 5-7 days and please return to the ER if your chest pain or shortness of breath worsens. Bronchitis Bronchitis is the body's way of reacting to injury and/or infection (inflammation) of the bronchi. Bronchi are the air tubes that extend from the windpipe into the lungs. If the inflammation becomes severe, it may cause shortness of breath. CAUSES  Inflammation may be caused by:  A virus.   Germs (bacteria).   Dust.   Allergens.   Pollutants and many other irritants.  The cells lining the bronchial tree are covered with tiny hairs (cilia). These constantly beat upward, away from the lungs, toward the mouth. This keeps the lungs free of pollutants. When these cells become too irritated and are unable to do their job, mucus begins to develop. This causes the characteristic cough of bronchitis. The cough clears the lungs when the cilia are unable to do their job. Without either of these protective mechanisms, the mucus would settle in the lungs. Then you would develop pneumonia. Smoking is a common cause of bronchitis and can contribute to pneumonia. Stopping this habit is the single most important thing you can do to help yourself. TREATMENT   Your caregiver may prescribe an antibiotic if the cough is caused by bacteria. Also, medicines that open up your airways make it easier to breathe. Your caregiver may also recommend or prescribe an expectorant. It will loosen the mucus to be coughed up. Only take over-the-counter or prescription medicines for pain, discomfort, or fever as directed by your caregiver.   Removing  whatever causes the problem (smoking, for example) is critical to preventing the problem from getting worse.   Cough suppressants may be prescribed for relief of cough symptoms.   Inhaled medicines may be prescribed to help with symptoms now and to help prevent problems from returning.   For those with recurrent (chronic) bronchitis, there may be a need for steroid medicines.  SEEK IMMEDIATE MEDICAL CARE IF:   During treatment, you develop more pus-like mucus (purulent sputum).   You have a fever.   Your baby is older than 3 months with a rectal temperature of 102 F (38.9 C) or higher.   Your baby is 42 months old or younger with a rectal temperature of 100.4 F (38 C) or higher.   You become progressively more ill.   You have increased difficulty breathing, wheezing, or shortness of breath.  It is necessary to seek immediate medical care if you are elderly or sick from any other disease. MAKE SURE YOU:   Understand these instructions.   Will watch your condition.   Will get help right away if you are not doing well or get worse.  Document Released: 08/16/2005 Document Revised: 08/05/2011 Document Reviewed: 06/25/2008 [facility] Patient Information 2012 [place], [place].

## 2012-01-01 NOTE — ED Notes (Signed)
Pt sent here from [facility]. CXR and "blood count" normal. Hyperventilating at triage. BBS-diminished.

## 2012-01-01 NOTE — ED Notes (Signed)
Received a call from [facility] nurse. States that patient had a normal CXR and normal CBC at their facility but was sent here due to "SOB with unknown etiology".

## 2012-01-01 NOTE — ED Notes (Signed)
Patient was given nebulizer per MD order. She was given an MDI as well and was instructed on proper MDI technique with spacer as well as when to use it. She currently had no questions and RT will continue to monitor.

## 2012-01-06 ENCOUNTER — Ambulatory Visit: Payer: Federal, State, Local not specified - PPO

## 2012-01-13 ENCOUNTER — Ambulatory Visit
Admission: RE | Admit: 2012-01-13 | Discharge: 2012-01-13 | Disposition: A | Discharge status: Home / Self Care | Payer: Federal, State, Local not specified - PPO | Source: Ambulatory Visit | Attending: Family Medicine | Admitting: Family Medicine

## 2012-01-13 DIAGNOSIS — Z1231 Encounter for screening mammogram for malignant neoplasm of breast: Secondary | ICD-10-CM

## 2012-01-25 NOTE — Telephone Encounter (Signed)
Error

## 2012-01-27 ENCOUNTER — Other Ambulatory Visit: Payer: Self-pay | Admitting: Family Medicine

## 2012-01-27 DIAGNOSIS — R928 Other abnormal and inconclusive findings on diagnostic imaging of breast: Secondary | ICD-10-CM

## 2012-03-15 ENCOUNTER — Telehealth: Payer: Self-pay

## 2012-03-15 NOTE — Telephone Encounter (Signed)
Discussed with patient and she stated that she received the certified letter, she had been taking care of her husband who recently had surgery. She will call the [facility] center next week to schedule   KP

## 2012-03-15 NOTE — Telephone Encounter (Signed)
Letter from the [facility] [place] advising further imaging of the right breast was recommended as a result of a DG screen mammogram bilateral performed on 01/13/12.   Msg left to cal the office    KP

## 2012-05-11 ENCOUNTER — Other Ambulatory Visit: Payer: Self-pay | Admitting: Family Medicine

## 2012-05-11 ENCOUNTER — Ambulatory Visit
Admission: RE | Admit: 2012-05-11 | Discharge: 2012-05-11 | Disposition: A | Discharge status: Home / Self Care | Payer: Federal, State, Local not specified - PPO | Source: Ambulatory Visit | Attending: Family Medicine | Admitting: Family Medicine

## 2012-05-11 DIAGNOSIS — R928 Other abnormal and inconclusive findings on diagnostic imaging of breast: Secondary | ICD-10-CM

## 2012-05-11 MED ORDER — ALPRAZOLAM 0.5 MG PO TABS: 0.5000 mg | ORAL_TABLET | Freq: Three times a day (TID) | ORAL | Status: DC | PRN

## 2012-05-15 ENCOUNTER — Telehealth: Payer: Self-pay

## 2012-05-15 MED ORDER — ALPRAZOLAM 0.5 MG PO TABS: 0.5000 mg | ORAL_TABLET | Freq: Three times a day (TID) | ORAL | Status: DC | PRN

## 2012-05-15 NOTE — Telephone Encounter (Signed)
Message copied by Arnette Norris on Mon May 15, 2012  5:41 PM ------      Message from: Lelon Perla      Created: Thu May 11, 2012  5:22 PM       Radiologist called-- pt needs biopsy and requested med for anxiety            Xanax 0.5  Mg 1 po tid prn #30  ----let pt know to bring med with her to bx and take one 30 min before procedure

## 2012-05-15 NOTE — Telephone Encounter (Signed)
Rx sent to [facility] [place]---   KP

## 2012-05-25 ENCOUNTER — Ambulatory Visit
Admission: RE | Admit: 2012-05-25 | Discharge: 2012-05-25 | Disposition: A | Discharge status: Home / Self Care | Payer: Federal, State, Local not specified - PPO | Source: Ambulatory Visit | Attending: Family Medicine | Admitting: Family Medicine

## 2012-05-25 ENCOUNTER — Other Ambulatory Visit: Payer: Self-pay | Admitting: Family Medicine

## 2012-05-25 DIAGNOSIS — R928 Other abnormal and inconclusive findings on diagnostic imaging of breast: Secondary | ICD-10-CM

## 2012-06-01 ENCOUNTER — Ambulatory Visit (INDEPENDENT_AMBULATORY_CARE_PROVIDER_SITE_OTHER): Payer: Self-pay | Admitting: General Surgery

## 2012-06-05 ENCOUNTER — Encounter (INDEPENDENT_AMBULATORY_CARE_PROVIDER_SITE_OTHER): Payer: Self-pay | Admitting: General Surgery

## 2012-10-02 ENCOUNTER — Encounter: Payer: Self-pay | Admitting: Family Medicine

## 2012-10-02 ENCOUNTER — Ambulatory Visit (INDEPENDENT_AMBULATORY_CARE_PROVIDER_SITE_OTHER): Payer: Federal, State, Local not specified - PPO | Admitting: Family Medicine

## 2012-10-02 VITALS — BP 124/80 | HR 61 | Temp 98.4°F | Wt 156.4 lb

## 2012-10-02 DIAGNOSIS — J029 Acute pharyngitis, unspecified: Secondary | ICD-10-CM

## 2012-10-02 LAB — POCT RAPID STREP A (OFFICE): Rapid Strep A Screen: NEGATIVE

## 2012-10-02 MED ORDER — MOMETASONE FUROATE 50 MCG/ACT NA SUSP: 2.0000 | Freq: Every day | NASAL | Status: DC

## 2012-10-02 NOTE — Patient Instructions (Addendum)

## 2012-10-02 NOTE — Progress Notes (Signed)
  Subjective:     Nancy [name] is a 47 y.o. female who presents for evaluation of sore throat. Associated symptoms include nasal blockage, post nasal drip, sinus and nasal congestion and sore throat. Onset of symptoms was 2 days ago, and have been unchanged since that time. She is drinking plenty of fluids. She has not had a recent close exposure to someone with proven streptococcal pharyngitis.  The following portions of the patient's history were reviewed and updated as appropriate: allergies, current medications, past family history, past medical history, past social history, past surgical history and problem list.  Review of Systems Pertinent items are noted in HPI.    Objective:    BP 124/80  Pulse 61  Temp 98.4 F (36.9 C) (Oral)  Wt 156 lb 6.4 oz (70.943 kg)  SpO2 98% General appearance: alert, cooperative, appears stated age and no distress Ears: normal TM's and external ear canals both ears Nose: Nares normal. Septum midline. Mucosa normal. No drainage or sinus tenderness., clear discharge, moderate congestion, sinus tenderness right Throat: lips, mucosa, and tongue normal; teeth and gums normal Neck: mild anterior cervical adenopathy, supple, symmetrical, trachea midline and thyroid not enlarged, symmetric, no tenderness/mass/nodules Lungs: clear to auscultation bilaterally Heart: S1, S2 normal  Laboratory Strep test done. Results:negative.    Assessment:    Acute pharyngitis, likely  Rhinitis with post nasal drip.    Plan:    Use of OTC analgesics recommended as well as salt water gargles. Use of decongestant recommended. Follow up as needed.

## 2012-10-04 LAB — CULTURE, GROUP A STREP: Organism ID, Bacteria: NORMAL

## 2012-12-20 ENCOUNTER — Encounter: Payer: Self-pay | Admitting: Lab

## 2012-12-21 ENCOUNTER — Ambulatory Visit (INDEPENDENT_AMBULATORY_CARE_PROVIDER_SITE_OTHER): Payer: Federal, State, Local not specified - PPO | Admitting: Family Medicine

## 2012-12-21 ENCOUNTER — Encounter: Payer: Federal, State, Local not specified - PPO | Admitting: Family Medicine

## 2012-12-21 ENCOUNTER — Encounter: Payer: Self-pay | Admitting: Family Medicine

## 2012-12-21 VITALS — BP 108/68 | HR 73 | Temp 98.0°F | Wt 158.4 lb

## 2012-12-21 DIAGNOSIS — M79641 Pain in right hand: Secondary | ICD-10-CM

## 2012-12-21 DIAGNOSIS — M79609 Pain in unspecified limb: Secondary | ICD-10-CM

## 2012-12-21 NOTE — Progress Notes (Signed)
  Subjective:    Nancy [name] is a 47 y.o. female who presents for evaluation of right hand/finger pain. Onset was sudden, related to a fall from standing. Mechanism of injury: fall. The pain is moderate, worsens with movement, and is relieved by rest. There is no associated numbness, tingling in hand. Evaluation to date: none. Treatment to date: ice.  Pt fell on outstreched hand while in mts--- pain is between 2 and third fingers.   The following portions of the patient's history were reviewed and updated as appropriate: allergies, current medications, past family history, past medical history, past social history, past surgical history and problem list.  Review of Systems Pertinent items are noted in HPI.    Objective:    BP 108/68  Pulse 73  Temp(Src) 98 F (36.7 C) (Oral)  Wt 158 lb 6.4 oz (71.85 kg)  BMI 26.36 kg/m2  SpO2 99% Right hand:  pain with palpation between 2-3 fingers.   Left hand:  normal exam, no swelling, tenderness, instability; ligaments intact, full ROM both hands, wrists, and finger joints   Imaging: X-ray right hand: not available    Assessment:    Hand strain    Plan:    Natural history and expected course discussed. Questions answered. Rest, ice, compression, and elevation (RICE) therapy. Hand surgeon referral.

## 2012-12-21 NOTE — Patient Instructions (Signed)
Hand Injuries  Minor fractures, sprains, bruises and burns of the hand are often managed in much the same way:   Elevation of your hand above the level of your heart for a few days after the injury, until the pain and swelling improve.   The use of hand dressings and splints to reduce motion, relieve painand prevent reinjury. The dressing and splint should not be removed without the caregiver's approval.   Application of ice packs for 20 to 30 minutes every few hours for 2 to 3 days to reduce pain and swelling due to fractures, sprains, and deep bruises.   The use of medicine to reduce pain and inflammation.  Early motion exercises are sometimes needed to reduce joint stiffness after a hand injury. However, your hand should not be used for any activities that increase pain.  Document Released: 09/23/2004 Document Revised: 11/08/2011 Document Reviewed: 11/15/2008  [employer] Patient Information 2013 [employer].

## 2013-01-23 ENCOUNTER — Encounter: Payer: Federal, State, Local not specified - PPO | Admitting: Family Medicine

## 2013-01-24 ENCOUNTER — Other Ambulatory Visit (HOSPITAL_COMMUNITY)
Admission: RE | Admit: 2013-01-24 | Discharge: 2013-01-24 | Disposition: A | Discharge status: Home / Self Care | Payer: Federal, State, Local not specified - PPO | Source: Ambulatory Visit | Attending: Family Medicine | Admitting: Family Medicine

## 2013-01-24 ENCOUNTER — Ambulatory Visit (INDEPENDENT_AMBULATORY_CARE_PROVIDER_SITE_OTHER): Payer: Federal, State, Local not specified - PPO | Admitting: Family Medicine

## 2013-01-24 ENCOUNTER — Encounter: Payer: Self-pay | Admitting: Family Medicine

## 2013-01-24 VITALS — BP 110/68 | HR 76 | Temp 99.4°F | Ht 66.0 in | Wt 158.2 lb

## 2013-01-24 DIAGNOSIS — Z124 Encounter for screening for malignant neoplasm of cervix: Secondary | ICD-10-CM

## 2013-01-24 DIAGNOSIS — Z23 Encounter for immunization: Secondary | ICD-10-CM

## 2013-01-24 DIAGNOSIS — Z1151 Encounter for screening for human papillomavirus (HPV): Secondary | ICD-10-CM | POA: Insufficient documentation

## 2013-01-24 DIAGNOSIS — Z01419 Encounter for gynecological examination (general) (routine) without abnormal findings: Secondary | ICD-10-CM | POA: Insufficient documentation

## 2013-01-24 DIAGNOSIS — Z0000 Encounter for general adult medical examination without abnormal findings: Secondary | ICD-10-CM

## 2013-01-24 NOTE — Patient Instructions (Addendum)
Preventive Care for Adults, Female A healthy lifestyle and preventive care can promote health and wellness. Preventive health guidelines for women include the following key practices.  A routine yearly physical is a good way to check with your caregiver about your health and preventive screening. It is a chance to share any concerns and updates on your health, and to receive a thorough exam.  Visit your dentist for a routine exam and preventive care every 6 months. Brush your teeth twice a day and floss once a day. Good oral hygiene prevents tooth decay and gum disease.  The frequency of eye exams is based on your age, health, family medical history, use of contact lenses, and other factors. Follow your caregiver's recommendations for frequency of eye exams.  Eat a healthy diet. Foods like vegetables, fruits, whole grains, low-fat dairy products, and lean protein foods contain the nutrients you need without too many calories. Decrease your intake of foods high in solid fats, added sugars, and salt. Eat the right amount of calories for you.Get information about a proper diet from your caregiver, if necessary.  Regular physical exercise is one of the most important things you can do for your health. Most adults should get at least 150 minutes of moderate-intensity exercise (any activity that increases your heart rate and causes you to sweat) each week. In addition, most adults need muscle-strengthening exercises on 2 or more days a week.  Maintain a healthy weight. The body mass index (BMI) is a screening tool to identify possible weight problems. It provides an estimate of body fat based on height and weight. Your caregiver can help determine your BMI, and can help you achieve or maintain a healthy weight.For adults 20 years and older:  A BMI below 18.5 is considered underweight.  A BMI of 18.5 to 24.9 is normal.  A BMI of 25 to 29.9 is considered overweight.  A BMI of 30 and above is  considered obese.  Maintain normal blood lipids and cholesterol levels by exercising and minimizing your intake of saturated fat. Eat a balanced diet with plenty of fruit and vegetables. Blood tests for lipids and cholesterol should begin at age 20 and be repeated every 5 years. If your lipid or cholesterol levels are high, you are over 50, or you are at high risk for heart disease, you may need your cholesterol levels checked more frequently.Ongoing high lipid and cholesterol levels should be treated with medicines if diet and exercise are not effective.  If you smoke, find out from your caregiver how to quit. If you do not use tobacco, do not start.  If you are pregnant, do not drink alcohol. If you are breastfeeding, be very cautious about drinking alcohol. If you are not pregnant and choose to drink alcohol, do not exceed 1 drink per day. One drink is considered to be 12 ounces (355 mL) of beer, 5 ounces (148 mL) of wine, or 1.5 ounces (44 mL) of liquor.  Avoid use of street drugs. Do not share needles with anyone. Ask for help if you need support or instructions about stopping the use of drugs.  High blood pressure causes heart disease and increases the risk of stroke. Your blood pressure should be checked at least every 1 to 2 years. Ongoing high blood pressure should be treated with medicines if weight loss and exercise are not effective.  If you are 55 to 46 years old, ask your caregiver if you should take aspirin to prevent strokes.  Diabetes   screening involves taking a blood sample to check your fasting blood sugar level. This should be done once every 3 years, after age 45, if you are within normal weight and without risk factors for diabetes. Testing should be considered at a younger age or be carried out more frequently if you are overweight and have at least 1 risk factor for diabetes.  Breast cancer screening is essential preventive care for women. You should practice "breast  self-awareness." This means understanding the normal appearance and feel of your breasts and may include breast self-examination. Any changes detected, no matter how small, should be reported to a caregiver. Women in their 20s and 30s should have a clinical breast exam (CBE) by a caregiver as part of a regular health exam every 1 to 3 years. After age 40, women should have a CBE every year. Starting at age 40, women should consider having a mammography (breast X-ray test) every year. Women who have a family history of breast cancer should talk to their caregiver about genetic screening. Women at a high risk of breast cancer should talk to their caregivers about having magnetic resonance imaging (MRI) and a mammography every year.  The Pap test is a screening test for cervical cancer. A Pap test can show cell changes on the cervix that might become cervical cancer if left untreated. A Pap test is a procedure in which cells are obtained and examined from the lower end of the uterus (cervix).  Women should have a Pap test starting at age 21.  Between ages 21 and 29, Pap tests should be repeated every 2 years.  Beginning at age 30, you should have a Pap test every 3 years as long as the past 3 Pap tests have been normal.  Some women have medical problems that increase the chance of getting cervical cancer. Talk to your caregiver about these problems. It is especially important to talk to your caregiver if a new problem develops soon after your last Pap test. In these cases, your caregiver may recommend more frequent screening and Pap tests.  The above recommendations are the same for women who have or have not gotten the vaccine for human papillomavirus (HPV).  If you had a hysterectomy for a problem that was not cancer or a condition that could lead to cancer, then you no longer need Pap tests. Even if you no longer need a Pap test, a regular exam is a good idea to make sure no other problems are  starting.  If you are between ages 65 and 70, and you have had normal Pap tests going back 10 years, you no longer need Pap tests. Even if you no longer need a Pap test, a regular exam is a good idea to make sure no other problems are starting.  If you have had past treatment for cervical cancer or a condition that could lead to cancer, you need Pap tests and screening for cancer for at least 20 years after your treatment.  If Pap tests have been discontinued, risk factors (such as a new sexual partner) need to be reassessed to determine if screening should be resumed.  The HPV test is an additional test that may be used for cervical cancer screening. The HPV test looks for the virus that can cause the cell changes on the cervix. The cells collected during the Pap test can be tested for HPV. The HPV test could be used to screen women aged 30 years and older, and should   be used in women of any age who have unclear Pap test results. After the age of 30, women should have HPV testing at the same frequency as a Pap test.  Colorectal cancer can be detected and often prevented. Most routine colorectal cancer screening begins at the age of 50 and continues through age 75. However, your caregiver may recommend screening at an earlier age if you have risk factors for colon cancer. On a yearly basis, your caregiver may provide home test kits to check for hidden blood in the stool. Use of a small camera at the end of a tube, to directly examine the colon (sigmoidoscopy or colonoscopy), can detect the earliest forms of colorectal cancer. Talk to your caregiver about this at age 50, when routine screening begins. Direct examination of the colon should be repeated every 5 to 10 years through age 75, unless early forms of pre-cancerous polyps or small growths are found.  Hepatitis C blood testing is recommended for all people born from 1945 through 1965 and any individual with known risks for hepatitis C.  Practice  safe sex. Use condoms and avoid high-risk sexual practices to reduce the spread of sexually transmitted infections (STIs). STIs include gonorrhea, chlamydia, syphilis, trichomonas, herpes, HPV, and human immunodeficiency virus (HIV). Herpes, HIV, and HPV are viral illnesses that have no cure. They can result in disability, cancer, and death. Sexually active women aged 25 and younger should be checked for chlamydia. Older women with new or multiple partners should also be tested for chlamydia. Testing for other STIs is recommended if you are sexually active and at increased risk.  Osteoporosis is a disease in which the bones lose minerals and strength with aging. This can result in serious bone fractures. The risk of osteoporosis can be identified using a bone density scan. Women ages 65 and over and women at risk for fractures or osteoporosis should discuss screening with their caregivers. Ask your caregiver whether you should take a calcium supplement or vitamin D to reduce the rate of osteoporosis.  Menopause can be associated with physical symptoms and risks. Hormone replacement therapy is available to decrease symptoms and risks. You should talk to your caregiver about whether hormone replacement therapy is right for you.  Use sunscreen with sun protection factor (SPF) of 30 or more. Apply sunscreen liberally and repeatedly throughout the day. You should seek shade when your shadow is shorter than you. Protect yourself by wearing long sleeves, pants, a wide-brimmed hat, and sunglasses year round, whenever you are outdoors.  Once a month, do a whole body skin exam, using a mirror to look at the skin on your back. Notify your caregiver of new moles, moles that have irregular borders, moles that are larger than a pencil eraser, or moles that have changed in shape or color.  Stay current with required immunizations.  Influenza. You need a dose every fall (or winter). The composition of the flu vaccine  changes each year, so being vaccinated once is not enough.  Pneumococcal polysaccharide. You need 1 to 2 doses if you smoke cigarettes or if you have certain chronic medical conditions. You need 1 dose at age 65 (or older) if you have never been vaccinated.  Tetanus, diphtheria, pertussis (Tdap, Td). Get 1 dose of Tdap vaccine if you are younger than age 65, are over 65 and have contact with an infant, are a healthcare worker, are pregnant, or simply want to be protected from whooping cough. After that, you need a Td   booster dose every 10 years. Consult your caregiver if you have not had at least 3 tetanus and diphtheria-containing shots sometime in your life or have a deep or dirty wound.  HPV. You need this vaccine if you are a woman age 26 or younger. The vaccine is given in 3 doses over 6 months.  Measles, mumps, rubella (MMR). You need at least 1 dose of MMR if you were born in 1957 or later. You may also need a second dose.  Meningococcal. If you are age 19 to 21 and a first-year college student living in a residence hall, or have one of several medical conditions, you need to get vaccinated against meningococcal disease. You may also need additional booster doses.  Zoster (shingles). If you are age 60 or older, you should get this vaccine.  Varicella (chickenpox). If you have never had chickenpox or you were vaccinated but received only 1 dose, talk to your caregiver to find out if you need this vaccine.  Hepatitis A. You need this vaccine if you have a specific risk factor for hepatitis A virus infection or you simply wish to be protected from this disease. The vaccine is usually given as 2 doses, 6 to 18 months apart.  Hepatitis B. You need this vaccine if you have a specific risk factor for hepatitis B virus infection or you simply wish to be protected from this disease. The vaccine is given in 3 doses, usually over 6 months. Preventive Services / Frequency Ages 19 to 39  Blood  pressure check.** / Every 1 to 2 years.  Lipid and cholesterol check.** / Every 5 years beginning at age 20.  Clinical breast exam.** / Every 3 years for women in their 20s and 30s.  Pap test.** / Every 2 years from ages 21 through 29. Every 3 years starting at age 30 through age 65 or 70 with a history of 3 consecutive normal Pap tests.  HPV screening.** / Every 3 years from ages 30 through ages 65 to 70 with a history of 3 consecutive normal Pap tests.  Hepatitis C blood test.** / For any individual with known risks for hepatitis C.  Skin self-exam. / Monthly.  Influenza immunization.** / Every year.  Pneumococcal polysaccharide immunization.** / 1 to 2 doses if you smoke cigarettes or if you have certain chronic medical conditions.  Tetanus, diphtheria, pertussis (Tdap, Td) immunization. / A one-time dose of Tdap vaccine. After that, you need a Td booster dose every 10 years.  HPV immunization. / 3 doses over 6 months, if you are 26 and younger.  Measles, mumps, rubella (MMR) immunization. / You need at least 1 dose of MMR if you were born in 1957 or later. You may also need a second dose.  Meningococcal immunization. / 1 dose if you are age 19 to 21 and a first-year college student living in a residence hall, or have one of several medical conditions, you need to get vaccinated against meningococcal disease. You may also need additional booster doses.  Varicella immunization.** / Consult your caregiver.  Hepatitis A immunization.** / Consult your caregiver. 2 doses, 6 to 18 months apart.  Hepatitis B immunization.** / Consult your caregiver. 3 doses usually over 6 months. Ages 40 to 64  Blood pressure check.** / Every 1 to 2 years.  Lipid and cholesterol check.** / Every 5 years beginning at age 20.  Clinical breast exam.** / Every year after age 40.  Mammogram.** / Every year beginning at age 40   and continuing for as long as you are in good health. Consult with your  caregiver.  Pap test.** / Every 3 years starting at age 30 through age 65 or 70 with a history of 3 consecutive normal Pap tests.  HPV screening.** / Every 3 years from ages 30 through ages 65 to 70 with a history of 3 consecutive normal Pap tests.  Fecal occult blood test (FOBT) of stool. / Every year beginning at age 50 and continuing until age 75. You may not need to do this test if you get a colonoscopy every 10 years.  Flexible sigmoidoscopy or colonoscopy.** / Every 5 years for a flexible sigmoidoscopy or every 10 years for a colonoscopy beginning at age 50 and continuing until age 75.  Hepatitis C blood test.** / For all people born from 1945 through 1965 and any individual with known risks for hepatitis C.  Skin self-exam. / Monthly.  Influenza immunization.** / Every year.  Pneumococcal polysaccharide immunization.** / 1 to 2 doses if you smoke cigarettes or if you have certain chronic medical conditions.  Tetanus, diphtheria, pertussis (Tdap, Td) immunization.** / A one-time dose of Tdap vaccine. After that, you need a Td booster dose every 10 years.  Measles, mumps, rubella (MMR) immunization. / You need at least 1 dose of MMR if you were born in 1957 or later. You may also need a second dose.  Varicella immunization.** / Consult your caregiver.  Meningococcal immunization.** / Consult your caregiver.  Hepatitis A immunization.** / Consult your caregiver. 2 doses, 6 to 18 months apart.  Hepatitis B immunization.** / Consult your caregiver. 3 doses, usually over 6 months. Ages 65 and over  Blood pressure check.** / Every 1 to 2 years.  Lipid and cholesterol check.** / Every 5 years beginning at age 20.  Clinical breast exam.** / Every year after age 40.  Mammogram.** / Every year beginning at age 40 and continuing for as long as you are in good health. Consult with your caregiver.  Pap test.** / Every 3 years starting at age 30 through age 65 or 70 with a 3  consecutive normal Pap tests. Testing can be stopped between 65 and 70 with 3 consecutive normal Pap tests and no abnormal Pap or HPV tests in the past 10 years.  HPV screening.** / Every 3 years from ages 30 through ages 65 or 70 with a history of 3 consecutive normal Pap tests. Testing can be stopped between 65 and 70 with 3 consecutive normal Pap tests and no abnormal Pap or HPV tests in the past 10 years.  Fecal occult blood test (FOBT) of stool. / Every year beginning at age 50 and continuing until age 75. You may not need to do this test if you get a colonoscopy every 10 years.  Flexible sigmoidoscopy or colonoscopy.** / Every 5 years for a flexible sigmoidoscopy or every 10 years for a colonoscopy beginning at age 50 and continuing until age 75.  Hepatitis C blood test.** / For all people born from 1945 through 1965 and any individual with known risks for hepatitis C.  Osteoporosis screening.** / A one-time screening for women ages 65 and over and women at risk for fractures or osteoporosis.  Skin self-exam. / Monthly.  Influenza immunization.** / Every year.  Pneumococcal polysaccharide immunization.** / 1 dose at age 65 (or older) if you have never been vaccinated.  Tetanus, diphtheria, pertussis (Tdap, Td) immunization. / A one-time dose of Tdap vaccine if you are over   65 and have contact with an infant, are a healthcare worker, or simply want to be protected from whooping cough. After that, you need a Td booster dose every 10 years.  Varicella immunization.** / Consult your caregiver.  Meningococcal immunization.** / Consult your caregiver.  Hepatitis A immunization.** / Consult your caregiver. 2 doses, 6 to 18 months apart.  Hepatitis B immunization.** / Check with your caregiver. 3 doses, usually over 6 months. ** Family history and personal history of risk and conditions may change your caregiver's recommendations. Document Released: 10/12/2001 Document Revised: 11/08/2011  Document Reviewed: 01/11/2011 [employer] Patient Information 2014 [employer].  

## 2013-01-24 NOTE — Progress Notes (Signed)
Subjective:     Nancy [name] is a 47 y.o. female and is here for a comprehensive physical exam. The patient reports no problems.  History   Social History  . Marital Status: Married    Spouse Name: N/A    Number of Children: 0  . Years of Education: 47   Occupational History  . [employer]  .     Social History Main Topics  . Smoking status: Never Smoker   . Smokeless tobacco: Never Used  . Alcohol Use: No  . Drug Use: No  . Sexually Active: Yes -- Female partner(s)   Other Topics Concern  . Not on file   Social History Narrative  . No narrative on file   Health Maintenance  Topic Date Due  . Influenza Vaccine  04/30/2013  . Mammogram  05/25/2013  . Pap Smear  01/25/2016  . Tetanus/tdap  01/25/2023    The following portions of the patient's history were reviewed and updated as appropriate: allergies, current medications, past family history, past medical history, past social history, past surgical history and problem list.  Review of Systems Review of Systems  Constitutional: Negative for activity change, appetite change and fatigue.  HENT: Negative for hearing loss, congestion, tinnitus and ear discharge.  dentist q40m Eyes: Negative for visual disturbance (see optho q1y -- vision corrected to 20/20 with glasses).  Respiratory: Negative for cough, chest tightness and shortness of breath.   Cardiovascular: Negative for chest pain, palpitations and leg swelling.  Gastrointestinal: Negative for abdominal pain, diarrhea, constipation and abdominal distention.  Genitourinary: Negative for urgency, frequency, decreased urine volume and difficulty urinating.  Musculoskeletal: Negative for back pain, arthralgias and gait problem.  Skin: Negative for color change, pallor and rash.  Neurological: Negative for dizziness, light-headedness, numbness and headaches.  Hematological: Negative for adenopathy. Does not bruise/bleed easily.  Psychiatric/Behavioral:  Negative for suicidal ideas, confusion, sleep disturbance, self-injury, dysphoric mood, decreased concentration and agitation.       Objective:    BP 110/68  Pulse 76  Temp(Src) 99.4 F (37.4 C) (Oral)  Ht 5\' 6"  (1.676 m)  Wt 158 lb 3.2 oz (71.759 kg)  BMI 25.55 kg/m2  SpO2 97%  LMP 01/10/2013 General appearance: alert, cooperative, appears stated age and no distress Head: Normocephalic, without obvious abnormality, atraumatic Eyes: conjunctivae/corneas clear. PERRL, EOM's intact. Fundi benign. Ears: normal TM's and external ear canals both ears Nose: Nares normal. Septum midline. Mucosa normal. No drainage or sinus tenderness. Throat: lips, mucosa, and tongue normal; teeth and gums normal Neck: no adenopathy, supple, symmetrical, trachea midline and thyroid not enlarged, symmetric, no tenderness/mass/nodules Back: symmetric, no curvature. ROM normal. No CVA tenderness. Lungs: clear to auscultation bilaterally Breasts: normal appearance, no masses or tenderness Heart: regular rate and rhythm, S1, S2 normal, no murmur, click, rub or gallop Abdomen: soft, non-tender; bowel sounds normal; no masses,  no organomegaly Pelvic: cervix normal in appearance, external genitalia normal, no adnexal masses or tenderness, no cervical motion tenderness, rectovaginal septum normal, uterus normal size, shape, and consistency and vagina normal without discharge Extremities: extremities normal, atraumatic, no cyanosis or edema Pulses: 2+ and symmetric Skin: Skin color, texture, turgor normal. No rashes or lesions Lymph nodes: Cervical, supraclavicular, and axillary nodes normal. Neurologic: Alert and oriented X 3, normal strength and tone. Normal symmetric reflexes. Normal coordination and gait Psych-- no anxiety, no depression      Assessment:    Healthy female exam.      Plan:  ghm utd Check labs See After Visit Summary for Counseling Recommendations

## 2013-01-29 ENCOUNTER — Encounter: Payer: Self-pay | Admitting: *Deleted

## 2013-05-22 ENCOUNTER — Encounter: Payer: Self-pay | Admitting: Cardiology

## 2013-08-31 ENCOUNTER — Telehealth: Payer: Self-pay | Admitting: *Deleted

## 2013-08-31 NOTE — Telephone Encounter (Signed)
08/31/2013 Pt is having foot issues with right foot.  Inside ball of right foot.  One day while running in October, sounded like "crumbling of straw hat".  Can only wear tennis shoes. Does not bother her as much when she is walking, but while resting it is uncomfortable. Sometimes goes up into her right hip/back with shooting pain. Worst pain has occurred aprox last 3 weeks.  Please advise.  bw

## 2013-08-31 NOTE — Telephone Encounter (Signed)
She needs an appointment. Please schedule        KP

## 2013-10-15 ENCOUNTER — Telehealth: Payer: Self-pay

## 2013-10-15 NOTE — Telephone Encounter (Signed)
She will need an Office visit with the provider.     KP

## 2013-10-15 NOTE — Telephone Encounter (Signed)
The patient called and is hoping to get a referral to a ENT dr as soon as possible.  Callback - [phone]

## 2013-10-17 ENCOUNTER — Ambulatory Visit: Payer: Federal, State, Local not specified - PPO | Admitting: Family Medicine

## 2014-01-25 ENCOUNTER — Encounter: Payer: Federal, State, Local not specified - PPO | Admitting: Family Medicine

## 2014-02-12 ENCOUNTER — Ambulatory Visit (INDEPENDENT_AMBULATORY_CARE_PROVIDER_SITE_OTHER): Payer: Federal, State, Local not specified - PPO | Admitting: Family Medicine

## 2014-02-12 ENCOUNTER — Encounter: Payer: Self-pay | Admitting: Family Medicine

## 2014-02-12 ENCOUNTER — Other Ambulatory Visit (HOSPITAL_COMMUNITY)
Admission: RE | Admit: 2014-02-12 | Discharge: 2014-02-12 | Disposition: A | Discharge status: Home / Self Care | Payer: Federal, State, Local not specified - PPO | Source: Ambulatory Visit | Attending: Family Medicine | Admitting: Family Medicine

## 2014-02-12 VITALS — BP 116/74 | HR 88 | Temp 98.9°F | Ht 66.0 in | Wt 156.2 lb

## 2014-02-12 DIAGNOSIS — Z124 Encounter for screening for malignant neoplasm of cervix: Secondary | ICD-10-CM

## 2014-02-12 DIAGNOSIS — K219 Gastro-esophageal reflux disease without esophagitis: Secondary | ICD-10-CM

## 2014-02-12 DIAGNOSIS — Z0000 Encounter for general adult medical examination without abnormal findings: Secondary | ICD-10-CM

## 2014-02-12 DIAGNOSIS — Z1151 Encounter for screening for human papillomavirus (HPV): Secondary | ICD-10-CM | POA: Insufficient documentation

## 2014-02-12 DIAGNOSIS — Z01419 Encounter for gynecological examination (general) (routine) without abnormal findings: Secondary | ICD-10-CM | POA: Insufficient documentation

## 2014-02-12 DIAGNOSIS — R195 Other fecal abnormalities: Secondary | ICD-10-CM

## 2014-02-12 DIAGNOSIS — R079 Chest pain, unspecified: Secondary | ICD-10-CM

## 2014-02-12 MED ORDER — GI COCKTAIL ~~LOC~~
30.0000 mL | Freq: Once | ORAL | Status: AC
  Administered 2014-02-12: 30 mL via ORAL

## 2014-02-12 MED ORDER — OMEPRAZOLE 40 MG PO CPDR: 40.0000 mg | DELAYED_RELEASE_CAPSULE | Freq: Every day | ORAL | Status: DC

## 2014-02-12 NOTE — Patient Instructions (Signed)

## 2014-02-12 NOTE — Progress Notes (Signed)
Pre visit review using our clinic review tool, if applicable. No additional management support is needed unless otherwise documented below in the visit note. 

## 2014-02-12 NOTE — Progress Notes (Signed)
Subjective:     Nancy [name] is a 48 y.o. female and is here for a comprehensive physical exam. The patient reports chest pain radiating to back last week while walking outside and it occurred again Friday.   She took a pepcid and started taking it everyday and symptoms have improved some.  Pain circles around L breast.     History   Social History  . Marital Status: Married    Spouse Name: N/A    Number of Children: 0  . Years of Education: 33   Occupational History  . [employer]  .     Social History Main Topics  . Smoking status: Never Smoker   . Smokeless tobacco: Never Used  . Alcohol Use: No  . Drug Use: No  . Sexual Activity: Yes    Partners: Male   Other Topics Concern  . Not on file   Social History Narrative  . No narrative on file   Health Maintenance  Topic Date Due  . Mammogram  05/25/2013  . Influenza Vaccine  03/30/2014  . Pap Smear  01/25/2016  . Tetanus/tdap  01/25/2023    The following portions of the patient's history were reviewed and updated as appropriate:  She  has a past medical history of Fibrocystic breast disease and Atopic eczema. She  does not have any pertinent problems on file. She  has past surgical history that includes Myomectomy (2002). Her family history includes Breast cancer in her paternal aunt; Diabetes in her father and mother; Gout in her mother; Hypertension in her father. She  reports that she has never smoked. She has never used smokeless tobacco. She reports that she does not drink alcohol or use illicit drugs. She has a current medication list which includes the following prescription(s): alprazolam, cetirizine, and prenatal vit-fe fumarate-fa. Current Outpatient Prescriptions on File Prior to Visit  Medication Sig Dispense Refill  . ALPRAZolam (XANAX) 0.5 MG tablet Take 1 tablet (0.5 mg total) by mouth 3 (three) times daily as needed.  30 tablet  0  . cetirizine (ZYRTEC) 10 MG tablet Take 10 mg by  mouth daily as needed. For allergies       . Prenatal Vit-Fe Sulfate-FA (PRENATAL VITAMIN PO) Take 1 tablet by mouth daily.         No current facility-administered medications on file prior to visit.   She is allergic to penicillins..  Review of Systems Review of Systems  Constitutional: Negative for activity change, appetite change and fatigue.  HENT: Negative for hearing loss, congestion, tinnitus and ear discharge.  dentist q72m Eyes: Negative for visual disturbance (see optho q1y -- vision corrected to 20/20 with glasses).  Respiratory: Negative for cough, chest tightness and shortness of breath.   Cardiovascular: Negative, palpitations and leg swelling. + chest pain Gastrointestinal: Negative for abdominal pain, diarrhea, constipation and abdominal distention.  Genitourinary: Negative for urgency, frequency, decreased urine volume and difficulty urinating.  Musculoskeletal: Negative for back pain, arthralgias and gait problem.  Skin: Negative for color change, pallor and rash.  Neurological: Negative for dizziness, light-headedness, numbness and headaches.  Hematological: Negative for adenopathy. Does not bruise/bleed easily.  Psychiatric/Behavioral: Negative for suicidal ideas, confusion, sleep disturbance, self-injury, dysphoric mood, decreased concentration and agitation.       Objective:    BP 116/74  Pulse 88  Temp(Src) 98.9 F (37.2 C) (Oral)  Ht 5\' 6"  (1.676 m)  Wt 156 lb 3.2 oz (70.852 kg)  BMI 25.22 kg/m2  SpO2 97%  LMP 01/22/2014 General appearance: alert, cooperative, appears stated age and no distress Head: Normocephalic, without obvious abnormality, atraumatic Eyes: conjunctivae/corneas clear. PERRL, EOM's intact. Fundi benign. Ears: normal TM's and external ear canals both ears Nose: Nares normal. Septum midline. Mucosa normal. No drainage or sinus tenderness. Throat: lips, mucosa, and tongue normal; teeth and gums normal Neck: no adenopathy, no carotid  bruit, no JVD, supple, symmetrical, trachea midline and thyroid not enlarged, symmetric, no tenderness/mass/nodules Back: symmetric, no curvature. ROM normal. No CVA tenderness. Lungs: clear to auscultation bilaterally Breasts: normal appearance, no masses or tenderness Heart: regular rate and rhythm, S1, S2 normal, no murmur, click, rub or gallop Abdomen: abnormal findings:  moderate tenderness in the epigastrium Pelvic: cervix normal in appearance, external genitalia normal, no adnexal masses or tenderness, no cervical motion tenderness, rectovaginal septum normal, uterus normal size, shape, and consistency, vagina normal without discharge and pap done and heme + brown stool Extremities: extremities normal, atraumatic, no cyanosis or edema Pulses: 2+ and symmetric Skin: Skin color, texture, turgor normal. No rashes or lesions Lymph nodes: Cervical, supraclavicular, and axillary nodes normal. Neurologic: Alert and oriented X 3, normal strength and tone. Normal symmetric reflexes. Normal coordination and gait Psych--no depression, no anxiety      Assessment:    Healthy female exam.    Plan:    ghm utd Check labs See After Visit Summary for Counseling Recommendations  1. Chest pain prob gerd - EKG 12-Lead  2. Preventative health care  - Basic metabolic panel; Future - CBC with Differential; Future - Hepatic function panel; Future - Lipid panel; Future - POCT urinalysis dipstick; Future - TSH; Future  3. GERD (gastroesophageal reflux disease)  - Ambulatory referral to Gastroenterology - omeprazole (PRILOSEC) 40 MG capsule; Take 1 capsule (40 mg total) by mouth daily.  Dispense: 30 capsule; Refill: 3 - gi cocktail (Maalox,Lidocaine,Donnatal); Take 30 mLs by mouth once.  4. Heme positive stool  - Ambulatory referral to Gastroenterology - CBC with Differential; Future  5. Screening for malignant neoplasm of the cervix  - Cytology - PAP

## 2014-02-13 ENCOUNTER — Encounter: Payer: Self-pay | Admitting: Internal Medicine

## 2014-02-13 ENCOUNTER — Other Ambulatory Visit (INDEPENDENT_AMBULATORY_CARE_PROVIDER_SITE_OTHER): Payer: Federal, State, Local not specified - PPO

## 2014-02-13 DIAGNOSIS — Z0000 Encounter for general adult medical examination without abnormal findings: Secondary | ICD-10-CM

## 2014-02-13 DIAGNOSIS — R195 Other fecal abnormalities: Secondary | ICD-10-CM

## 2014-02-13 LAB — BASIC METABOLIC PANEL
BUN: 9 mg/dL (ref 6–23)
CO2: 25 mEq/L (ref 19–32)
Calcium: 9.3 mg/dL (ref 8.4–10.5)
Chloride: 105 mEq/L (ref 96–112)
Creatinine, Ser: 0.8 mg/dL (ref 0.4–1.2)
GFR: 96.14 mL/min (ref >60.00)
Glucose, Bld: 85 mg/dL (ref 70–99)
Potassium: 4.4 mEq/L (ref 3.5–5.1)
Sodium: 137 mEq/L (ref 135–145)

## 2014-02-13 LAB — HEPATIC FUNCTION PANEL
ALT: 17 U/L (ref 0–35)
AST: 18 U/L (ref 0–37)
Albumin: 4.5 g/dL (ref 3.5–5.2)
Alkaline Phosphatase: 35 U/L — ABNORMAL LOW (ref 39–117)
Bilirubin, Direct: 0 mg/dL (ref 0.0–0.3)
Total Bilirubin: 0.1 mg/dL — ABNORMAL LOW (ref 0.2–1.2)
Total Protein: 7.5 g/dL (ref 6.0–8.3)

## 2014-02-13 LAB — LIPID PANEL
Cholesterol: 144 mg/dL (ref 0–200)
HDL: 55.1 mg/dL (ref >39.00)
LDL Cholesterol: 72 mg/dL (ref 0–99)
NonHDL: 88.9
Total CHOL/HDL Ratio: 3
Triglycerides: 86 mg/dL (ref 0.0–149.0)
VLDL: 17.2 mg/dL (ref 0.0–40.0)

## 2014-02-13 LAB — TSH: TSH: 0.2 u[IU]/mL — ABNORMAL LOW (ref 0.35–4.50)

## 2014-02-13 LAB — CBC WITH DIFFERENTIAL/PLATELET
Basophils Absolute: 0 10*3/uL (ref 0.0–0.1)
Basophils Relative: 0.5 % (ref 0.0–3.0)
Eosinophils Absolute: 0 10*3/uL (ref 0.0–0.7)
Eosinophils Relative: 0.8 % (ref 0.0–5.0)
HCT: 42.7 % (ref 36.0–46.0)
Hemoglobin: 14 g/dL (ref 12.0–15.0)
Lymphocytes Relative: 52.1 % — ABNORMAL HIGH (ref 12.0–46.0)
Lymphs Abs: 2.7 10*3/uL (ref 0.7–4.0)
MCHC: 32.7 g/dL (ref 30.0–36.0)
MCV: 87.1 fl (ref 78.0–100.0)
Monocytes Absolute: 0.3 10*3/uL (ref 0.1–1.0)
Monocytes Relative: 5.9 % (ref 3.0–12.0)
Neutro Abs: 2.1 10*3/uL (ref 1.4–7.7)
Neutrophils Relative %: 40.7 % — ABNORMAL LOW (ref 43.0–77.0)
Platelets: 231 10*3/uL (ref 150.0–400.0)
RBC: 4.9 Mil/uL (ref 3.87–5.11)
RDW: 13.6 % (ref 11.5–15.5)
WBC: 5.2 10*3/uL (ref 4.0–10.5)

## 2014-02-14 LAB — CYTOLOGY - PAP

## 2014-02-20 DIAGNOSIS — E0590 Thyrotoxicosis, unspecified without thyrotoxic crisis or storm: Secondary | ICD-10-CM

## 2014-02-21 ENCOUNTER — Other Ambulatory Visit (INDEPENDENT_AMBULATORY_CARE_PROVIDER_SITE_OTHER): Payer: Federal, State, Local not specified - PPO

## 2014-02-21 DIAGNOSIS — E0590 Thyrotoxicosis, unspecified without thyrotoxic crisis or storm: Secondary | ICD-10-CM

## 2014-02-21 LAB — TSH: TSH: 0.17 u[IU]/mL — ABNORMAL LOW (ref 0.35–4.50)

## 2014-02-21 LAB — T3, FREE: T3, Free: 2.4 pg/mL (ref 2.3–4.2)

## 2014-02-21 LAB — T4, FREE: Free T4: 0.83 ng/dL (ref 0.60–1.60)

## 2014-02-26 ENCOUNTER — Telehealth: Payer: Self-pay

## 2014-02-26 DIAGNOSIS — R7989 Other specified abnormal findings of blood chemistry: Secondary | ICD-10-CM

## 2014-02-26 DIAGNOSIS — E0590 Thyrotoxicosis, unspecified without thyrotoxic crisis or storm: Secondary | ICD-10-CM

## 2014-02-26 DIAGNOSIS — R079 Chest pain, unspecified: Secondary | ICD-10-CM

## 2014-02-26 NOTE — Telephone Encounter (Signed)
Endo referral place per Dr. Nonda Lou order.  Pt is aware.

## 2014-02-27 ENCOUNTER — Telehealth: Payer: Self-pay | Admitting: Family Medicine

## 2014-02-27 NOTE — Telephone Encounter (Signed)
Pt states that the first available appointment to see an endocrinologist is July 16th.  She is calling because she was would like know if there is someone available that could see her a lot sooner?  Pt is getting a little concerned due to her abnormal heart beat and frequent headaches.  Pt was reassured that 2 weeks is typically standard for appointments, however she would still like for someone to see if she could get a appointment sooner.  Please advise.

## 2014-02-27 NOTE — Telephone Encounter (Signed)
Caller name: Magda Paganini Relation to pt: Call back number:[phone]   Reason for call:  Pt would like to speak with someone regarding the referral that was placed.

## 2014-02-27 NOTE — Telephone Encounter (Signed)
Was able to get pt patient in one day earlier, pt understood that was the earliest appt

## 2014-03-13 ENCOUNTER — Ambulatory Visit (INDEPENDENT_AMBULATORY_CARE_PROVIDER_SITE_OTHER): Payer: Federal, State, Local not specified - PPO | Admitting: Endocrinology

## 2014-03-13 ENCOUNTER — Encounter: Payer: Self-pay | Admitting: Endocrinology

## 2014-03-13 VITALS — BP 120/88 | HR 76 | Temp 99.0°F | Ht 66.0 in | Wt 154.0 lb

## 2014-03-13 DIAGNOSIS — E0590 Thyrotoxicosis, unspecified without thyrotoxic crisis or storm: Secondary | ICD-10-CM

## 2014-03-13 NOTE — Patient Instructions (Signed)
Please have a thyroid ultrasound.  you will receive a phone call, about a day and time for an appointment. If it is normal, let's recheck the blood tests in 1 month. If it shows nodules, we'll check a nuclear medicine test.

## 2014-03-13 NOTE — Progress Notes (Signed)
Subjective:    Patient ID: Nancy [name], female    DOB: Feb 13, 1966, 48 y.o.   MRN: [id number]  HPI Pt reports she was dx'ed with hyperthyroidism in June of 2015, on a routine blood test.  she has never been on therapy for this.  she has never had XRT to the anterior neck, or thyroid surgery.  she has never had thyroid imaging.  she does not consume kelp or any other prescribed or non-prescribed thyroid medication.  she has never been on amiodarone.  She has mild right frontal headache, but no assoc visual loss.  Past Medical History  Diagnosis Date  . Fibrocystic breast disease   . Atopic eczema     Past Surgical History  Procedure Laterality Date  . Myomectomy  2002    History   Social History  . Marital Status: Married    Spouse Name: N/A    Number of Children: 0  . Years of Education: 60   Occupational History  . [employer]  .     Social History Main Topics  . Smoking status: Never Smoker   . Smokeless tobacco: Never Used  . Alcohol Use: No  . Drug Use: No  . Sexual Activity: Yes    Partners: Male   Other Topics Concern  . Not on file   Social History Narrative  . No narrative on file    Current Outpatient Prescriptions on File Prior to Visit  Medication Sig Dispense Refill  . ALPRAZolam (XANAX) 0.5 MG tablet Take 1 tablet (0.5 mg total) by mouth 3 (three) times daily as needed.  30 tablet  0  . cetirizine (ZYRTEC) 10 MG tablet Take 10 mg by mouth daily as needed. For allergies       . omeprazole (PRILOSEC) 40 MG capsule Take 1 capsule (40 mg total) by mouth daily.  30 capsule  3  . Prenatal Vit-Fe Sulfate-FA (PRENATAL VITAMIN PO) Take 1 tablet by mouth daily.         No current facility-administered medications on file prior to visit.    Allergies  Allergen Reactions  . Penicillins Anaphylaxis    Family History  Problem Relation Age of Onset  . Gout Mother   . Diabetes Mother   . Hypertension Father   . Diabetes Father   .  Breast cancer Paternal Aunt   . Thyroid disease Neg Hx     BP 120/88  Pulse 76  Temp(Src) 99 F (37.2 C) (Oral)  Ht 5\' 6"  (1.676 m)  Wt 154 lb (69.854 kg)  BMI 24.87 kg/m2  SpO2 99%  LMP 01/22/2014  Review of Systems denies weight loss, seizure, hoarseness, double vision, sob, diarrhea, polyuria, myalgias, excessive diaphoresis, numbness, tremor, anxiety, easy bruising, and rhinorrhea.  She seldom has palpitations.  She has reg menses.      Objective:   Physical Exam VS: see vs page GEN: no distress HEAD: head: no deformity eyes: no periorbital swelling, no proptosis external nose and ears are normal mouth: no lesion seen NECK: supple, thyroid is not enlarged CHEST WALL: no deformity LUNGS:  Clear to auscultation.   CV: reg rate and rhythm, no murmur ABD: abdomen is soft, nontender.  no hepatosplenomegaly.  not distended.  no hernia MUSCULOSKELETAL: muscle bulk and strength are grossly normal.  no obvious joint swelling.  gait is normal and steady EXTEMITIES: no deformity.  no ulcer on the feet.  feet are of normal color and temp.  no edema  PULSES: dorsalis pedis intact bilat.  no carotid bruit NEURO:  cn 2-12 grossly intact.   readily moves all 4's.  sensation is intact to touch on the feet.  No tremor. SKIN:  Normal texture and temperature.  No rash or suspicious lesion is visible.  Not diaphoretic. NODES:  None palpable at the neck.   PSYCH: alert, well-oriented.  Does not appear anxious nor depressed.    Lab Results  Component Value Date   TSH 0.17* 02/21/2014   i reviewed report of CXR (2012): no mention is made of a goiter.    i have reviewed the following outside records: Office notes     Assessment & Plan:  Hyperthyroidism: mild, new to me Headache: pt advised this is unlikely to be thyroid-related. Palpitations: possibly thyroid-related.     Patient is advised the following: Patient Instructions  Please have a thyroid ultrasound.  you will receive a  phone call, about a day and time for an appointment. If it is normal, let's recheck the blood tests in 1 month. If it shows nodules, we'll check a nuclear medicine test.

## 2014-03-14 ENCOUNTER — Ambulatory Visit: Payer: Federal, State, Local not specified - PPO | Admitting: Endocrinology

## 2014-03-18 ENCOUNTER — Ambulatory Visit
Admission: RE | Admit: 2014-03-18 | Discharge: 2014-03-18 | Disposition: A | Discharge status: Home / Self Care | Payer: Federal, State, Local not specified - PPO | Source: Ambulatory Visit | Attending: Endocrinology | Admitting: Endocrinology

## 2014-03-18 ENCOUNTER — Other Ambulatory Visit: Payer: Self-pay | Admitting: Endocrinology

## 2014-03-18 DIAGNOSIS — E0590 Thyrotoxicosis, unspecified without thyrotoxic crisis or storm: Secondary | ICD-10-CM

## 2014-03-19 NOTE — Progress Notes (Signed)
Quick Note:  Pt advised of results via voicemail. ______

## 2014-04-10 ENCOUNTER — Other Ambulatory Visit (INDEPENDENT_AMBULATORY_CARE_PROVIDER_SITE_OTHER): Payer: Federal, State, Local not specified - PPO

## 2014-04-10 DIAGNOSIS — E0590 Thyrotoxicosis, unspecified without thyrotoxic crisis or storm: Secondary | ICD-10-CM

## 2014-04-10 LAB — T4, FREE: Free T4: 0.94 ng/dL (ref 0.60–1.60)

## 2014-04-10 LAB — TSH: TSH: 0.32 u[IU]/mL — ABNORMAL LOW (ref 0.35–4.50)

## 2014-04-11 ENCOUNTER — Ambulatory Visit (INDEPENDENT_AMBULATORY_CARE_PROVIDER_SITE_OTHER): Payer: Federal, State, Local not specified - PPO | Admitting: Family Medicine

## 2014-04-11 ENCOUNTER — Encounter: Payer: Self-pay | Admitting: Family Medicine

## 2014-04-11 ENCOUNTER — Encounter (INDEPENDENT_AMBULATORY_CARE_PROVIDER_SITE_OTHER): Payer: Self-pay

## 2014-04-11 VITALS — BP 130/86 | HR 76 | Ht 66.0 in | Wt 154.0 lb

## 2014-04-11 DIAGNOSIS — M722 Plantar fascial fibromatosis: Secondary | ICD-10-CM

## 2014-04-11 DIAGNOSIS — M79604 Pain in right leg: Secondary | ICD-10-CM

## 2014-04-11 DIAGNOSIS — M79609 Pain in unspecified limb: Secondary | ICD-10-CM

## 2014-04-11 NOTE — Patient Instructions (Signed)
Your pain is due to one of two things: achilles/plantar fasciitis with compensatory issues (calf strain, glut medius, hamstring strain from overuse) or less likely an irritated nerve in your back that goes down your leg. Both are treated similarly. Take the meloxicam daily until you've finished this - if you want [place] to send more in just call us. Consider prednisone dose pack - call us if you want to try this also. Start physical therapy for stretching, strengthening, modalities. Ok to continue with chiropractic care as this seems to have helped as well. Calf sleeve, heel lifts (and/or your orthotics) if these help alleviate the pain in the right leg. Icing or heat as needed (heat tends to help better with muscle spasms in the calf, hamstring, glut medius where icing would help more with the heel as you've noted). Follow up with me in 1 month to 6 weeks for reevaluation.

## 2014-04-12 ENCOUNTER — Encounter: Payer: Self-pay | Admitting: Family Medicine

## 2014-04-12 DIAGNOSIS — M79604 Pain in right leg: Secondary | ICD-10-CM | POA: Insufficient documentation

## 2014-04-12 NOTE — Assessment & Plan Note (Signed)
most likely due to achilles tendinopathy and plantar fasciitis with compensatory issues (calf strain, glut medius, hamstring strain from overuse), less likely lumbar radiculopathy.  Take meloxicam regularly (only tried once).  Start physical therapy - this is most likely thing to cause permanent relief of these issues.  Calf sleeve, heel lifts, and orthotics (has these).  Icing/heat discussed.  Consider prednisone dose pack if not improving.  F/u in 1 month to 6 weeks.

## 2014-04-12 NOTE — Progress Notes (Signed)
Patient ID: Nancy [name], female   DOB: 11-10-1965, 48 y.o.   MRN: [id number]  PCP: Garnet Koyanagi, DO Referred by: Glean Salvo PA at [facility]   Subjective:   HPI: Patient is a 48 y.o. female here for right leg pain.  Patient reports she had plantar fasciitis back in January. She did HEP, stretches, used orthotics and pain greatly decreased. She until recently had been walking about 10-12 miles per week. Then on July 18 she went hiking at [employer]. Had a little pain in her ankle after this but not bad. Then on about 7/25-28 she did a hiking and ropes course and about 1-2 days later pain was pretty bad in right heel going up calf, hamstring to hip. No swelling or bruising of these areas. Icing of her foot and heel did help. Given meloxicam but she only took this once. Seen a chiropractor since Urgent Care visit and this has helped with her hip pain. No numbness/tingling. No bowel/bladder dysfunction.  Past Medical History  Diagnosis Date  . Fibrocystic breast disease   . Atopic eczema     Current Outpatient Prescriptions on File Prior to Visit  Medication Sig Dispense Refill  . ALPRAZolam (XANAX) 0.5 MG tablet Take 1 tablet (0.5 mg total) by mouth 3 (three) times daily as needed.  30 tablet  0  . cetirizine (ZYRTEC) 10 MG tablet Take 10 mg by mouth daily as needed. For allergies       . omeprazole (PRILOSEC) 40 MG capsule Take 1 capsule (40 mg total) by mouth daily.  30 capsule  3  . Prenatal Vit-Fe Sulfate-FA (PRENATAL VITAMIN PO) Take 1 tablet by mouth daily.         No current facility-administered medications on file prior to visit.    Past Surgical History  Procedure Laterality Date  . Myomectomy  2002    Allergies  Allergen Reactions  . Penicillins Anaphylaxis    History   Social History  . Marital Status: Married    Spouse Name: N/A    Number of Children: 0  . Years of Education: 43   Occupational History  . [employer]  .      Social History Main Topics  . Smoking status: Never Smoker   . Smokeless tobacco: Never Used  . Alcohol Use: No  . Drug Use: No  . Sexual Activity: Yes    Partners: Male   Other Topics Concern  . Not on file   Social History Narrative  . No narrative on file    Family History  Problem Relation Age of Onset  . Gout Mother   . Diabetes Mother   . Hypertension Father   . Diabetes Father   . Breast cancer Paternal Aunt   . Thyroid disease Neg Hx     BP 130/86  Pulse 76  Ht 5\' 6"  (1.676 m)  Wt 154 lb (69.854 kg)  BMI 24.87 kg/m2  Review of Systems: See HPI above.    Objective:  Physical Exam:  Gen: NAD  Right hip/back: No gross deformity, scoliosis. TTP mildly right buttock.  No midline or bony TTP. FROM with mild pain on hip abduction. Strength LEs 5/5 all muscle groups except 4/5 hip abduction.   2+ MSRs in patellar and achilles tendons, equal bilaterally. Negative SLRs. Sensation intact to light touch bilaterally. Negative logroll bilateral hips Negative fabers and piriformis stretches.  Right knee: No gross deformity, ecchymoses, swelling. Mild TTP medial gastroc.  No other tenderness. FROM. Negative ant/post drawers. Negative valgus/varus testing. Negative lachmanns. Negative mcmurrays, apleys, patellar apprehension. NV intact distally.  Right foot/ankle: No gross deformity, swelling, ecchymoses FROM TTP mildly achilles and plantar fascia Negative ant drawer and talar tilt.   Negative syndesmotic compression. Negative calcaneal squeeze. Thompsons test negative. NV intact distally.    Assessment & Plan:  1. Right leg pain - most likely due to achilles tendinopathy and plantar fasciitis with compensatory issues (calf strain, glut medius, hamstring strain from overuse), less likely lumbar radiculopathy.  Take meloxicam regularly (only tried once).  Start physical therapy - this is most likely thing to cause permanent relief of these issues.  Calf  sleeve, heel lifts, and orthotics (has these).  Icing/heat discussed.  Consider prednisone dose pack if not improving.  F/u in 1 month to 6 weeks.

## 2014-04-23 ENCOUNTER — Encounter: Payer: Self-pay | Admitting: Internal Medicine

## 2014-04-23 ENCOUNTER — Ambulatory Visit (INDEPENDENT_AMBULATORY_CARE_PROVIDER_SITE_OTHER): Payer: Federal, State, Local not specified - PPO | Admitting: Internal Medicine

## 2014-04-23 VITALS — BP 108/70 | HR 70 | Ht 66.0 in | Wt 156.0 lb

## 2014-04-23 DIAGNOSIS — R0789 Other chest pain: Secondary | ICD-10-CM

## 2014-04-23 DIAGNOSIS — R195 Other fecal abnormalities: Secondary | ICD-10-CM

## 2014-04-23 MED ORDER — MOVIPREP 100 G PO SOLR: 1.0000 | Freq: Once | ORAL | Status: DC

## 2014-04-23 NOTE — Progress Notes (Signed)
Patient ID: Nancy [name], female   DOB: [birth year], 48 y.o.   MRN: [id number] HPI: Nancy [name] is a 58 to female with PMH of fibrocystic breast diseae who is seen in consultation at the request of Dr. Etter Sjogren to evaluate atypical chest pain and heme + stool.  She is here alone today.  She reports over the last few months having 2 isolated episodes of left upper quadrant chest pain. This occurred twice in June and none since. She was seen by Dr. Etter Sjogren and the question was raised of possible reflux disease. It seems omeprazole was prescribed though she never started this medicine. She is taking famotidine 10 mg each morning. The chest pain occurred 2 times in the midafternoon without exertion. It was located in the left upper chest and described as a "sharp spike". It lasted for 20 seconds and abated. It was not associated with nausea or shortness of breath. She denies typical heartburn, waterbrash symptoms. No nausea or vomiting. No dysphagia or odynophagia. Occasionally when she exercises she notices heartburn, otherwise heartburn has not been a issue for her. She reports regular bowel movements without diarrhea or constipation. No visible blood in her stool or melena. She denies a family history of GI tract malignancy, IBD. Her chest pain has not occurred in over 2 months.  Past Medical History  Diagnosis Date  . Fibrocystic breast disease   . Atopic eczema     Past Surgical History  Procedure Laterality Date  . Myomectomy  2002    Outpatient Prescriptions Prior to Visit  Medication Sig Dispense Refill  . cetirizine (ZYRTEC) 10 MG tablet Take 10 mg by mouth daily as needed. For allergies       . Prenatal Vit-Fe Sulfate-FA (PRENATAL VITAMIN PO) Take 1 tablet by mouth daily.        Marland Kitchen ALPRAZolam (XANAX) 0.5 MG tablet Take 1 tablet (0.5 mg total) by mouth 3 (three) times daily as needed.  30 tablet  0  . meloxicam (MOBIC) 7.5 MG tablet       . omeprazole (PRILOSEC) 40 MG capsule Take 1  capsule (40 mg total) by mouth daily.  30 capsule  3   No facility-administered medications prior to visit.    Allergies  Allergen Reactions  . Penicillins Anaphylaxis    Family History  Problem Relation Age of Onset  . Gout Mother   . Diabetes Mother   . Hypertension Father   . Diabetes Father   . Breast cancer Paternal Aunt   . Thyroid disease Neg Hx     History  Substance Use Topics  . Smoking status: Never Smoker   . Smokeless tobacco: Never Used  . Alcohol Use: No    ROS: As per history of present illness, otherwise negative  BP 108/70  Pulse 70  Ht 5\' 6"  (1.676 m)  Wt 156 lb (70.761 kg)  BMI 25.19 kg/m2 Constitutional: Well-developed and well-nourished. No distress. HEENT: Normocephalic and atraumatic. Oropharynx is clear and moist. No oropharyngeal exudate. Conjunctivae are normal.  No scleral icterus. Neck: Neck supple. Trachea midline. Cardiovascular: Normal rate, regular rhythm and intact distal pulses. No M/R/G Pulmonary/chest: Effort normal and breath sounds normal. No wheezing, rales or rhonchi. Abdominal: Soft, nontender, nondistended. Bowel sounds active throughout. There are no masses palpable. No hepatosplenomegaly. Extremities: no clubbing, cyanosis, or edema Lymphadenopathy: No cervical adenopathy noted. Neurological: Alert and oriented to person place and time. Skin: Skin is warm and dry. No rashes noted. Psychiatric: Normal mood and affect. Behavior  is normal.  RELEVANT LABS AND IMAGING: CBC    Component Value Date/Time   WBC 5.2 02/13/2014 0802   RBC 4.90 02/13/2014 0802   HGB 14.0 02/13/2014 0802   HCT 42.7 02/13/2014 0802   PLT 231.0 02/13/2014 0802   MCV 87.1 02/13/2014 0802   MCHC 32.7 02/13/2014 0802   RDW 13.6 02/13/2014 0802   LYMPHSABS 2.7 02/13/2014 0802   MONOABS 0.3 02/13/2014 0802   EOSABS 0.0 02/13/2014 0802   BASOSABS 0.0 02/13/2014 0802    CMP     Component Value Date/Time   NA 137 02/13/2014 0802   K 4.4 02/13/2014 0802    CL 105 02/13/2014 0802   CO2 25 02/13/2014 0802   GLUCOSE 85 02/13/2014 0802   BUN 9 02/13/2014 0802   CREATININE 0.8 02/13/2014 0802   CALCIUM 9.3 02/13/2014 0802   PROT 7.5 02/13/2014 0802   ALBUMIN 4.5 02/13/2014 0802   AST 18 02/13/2014 0802   ALT 17 02/13/2014 0802   ALKPHOS 35* 02/13/2014 0802   BILITOT 0.1* 02/13/2014 0802   GFRNONAA 100.89 10/14/2009 0000   GFRAA 119 10/15/2008 1355    ASSESSMENT/PLAN:  63 to female with PMH of fibrocystic breast diseae who is seen in consultation at the request of Dr. Etter Sjogren to evaluate atypical chest pain and heme + stool.  1. Atypical chest pain -- given the location of the pain, the abrupt onset and extremely short duration, it is unlikely to be reflux disease related. Esophageal spasm was discussed today, but again I would expect this to be more substernal pain rather than in the left upper chest. She is taking famotidine 10 mg each morning which we will continue for now. Given the pain has not been present for 2 months, will not workup further at this time. I asked that she notify me and her primary care if this pain returns. She is comfortable with this plan.  2.  heme positive stool -- found by primary care on rectal exam at routine physical. We discussed heme-positive stool and I have recommended colonoscopy. We discussed the risks and benefits and she is agreeable to proceed.  Followup as needed and for colonoscopy

## 2014-04-23 NOTE — Patient Instructions (Signed)
You have been scheduled for a colonoscopy. Please follow written instructions given to you at your visit today.  Please pick up your prep kit at the pharmacy within the next 1-3 days. If you use inhalers (even only as needed), please bring them with you on the day of your procedure. Your physician has requested that you go to www.startemmi.com and enter the access code given to you at your visit today. This web site gives a general overview about your procedure. However, you should still follow specific instructions given to you by our office regarding your preparation for the procedure. CC:  Garnet Koyanagi MD

## 2014-05-20 ENCOUNTER — Encounter: Payer: Federal, State, Local not specified - PPO | Admitting: Internal Medicine

## 2014-05-22 ENCOUNTER — Ambulatory Visit (AMBULATORY_SURGERY_CENTER): Payer: Federal, State, Local not specified - PPO | Admitting: Internal Medicine

## 2014-05-22 ENCOUNTER — Encounter: Payer: Self-pay | Admitting: Internal Medicine

## 2014-05-22 VITALS — BP 125/84 | HR 78 | Temp 97.1°F | Resp 17 | Ht 66.0 in | Wt 156.0 lb

## 2014-05-22 DIAGNOSIS — D126 Benign neoplasm of colon, unspecified: Secondary | ICD-10-CM

## 2014-05-22 DIAGNOSIS — D125 Benign neoplasm of sigmoid colon: Secondary | ICD-10-CM

## 2014-05-22 DIAGNOSIS — R195 Other fecal abnormalities: Secondary | ICD-10-CM

## 2014-05-22 MED ORDER — SODIUM CHLORIDE 0.9 % IV SOLN: 500.0000 mL | INTRAVENOUS | Status: DC

## 2014-05-22 NOTE — Progress Notes (Signed)
Called to room to assist during endoscopic procedure.  Patient ID and intended procedure confirmed with present staff. Received instructions for my participation in the procedure from the performing physician.  

## 2014-05-22 NOTE — Patient Instructions (Signed)
YOU HAD AN ENDOSCOPIC PROCEDURE TODAY AT THE [facility]: Refer to the procedure report that was given to you for any specific questions about what was found during the examination.  If the procedure report does not answer your questions, please call your gastroenterologist to clarify.  If you requested that your care partner not be given the details of your procedure findings, then the procedure report has been included in a sealed envelope for you to review at your convenience later.  YOU SHOULD EXPECT: Some feelings of bloating in the abdomen. Passage of more gas than usual.  Walking can help get rid of the air that was put into your GI tract during the procedure and reduce the bloating. If you had a lower endoscopy (such as a colonoscopy or flexible sigmoidoscopy) you may notice spotting of blood in your stool or on the toilet paper. If you underwent a bowel prep for your procedure, then you may not have a normal bowel movement for a few days.  DIET: Your first meal following the procedure should be a light meal and then it is ok to progress to your normal diet.  A half-sandwich or bowl of soup is an example of a good first meal.  Heavy or fried foods are harder to digest and may make you feel nauseous or bloated.  Likewise meals heavy in dairy and vegetables can cause extra gas to form and this can also increase the bloating.  Drink plenty of fluids but you should avoid alcoholic beverages for 24 hours.  ACTIVITY: Your care partner should take you home directly after the procedure.  You should plan to take it easy, moving slowly for the rest of the day.  You can resume normal activity the day after the procedure however you should NOT DRIVE or use heavy machinery for 24 hours (because of the sedation medicines used during the test).    SYMPTOMS TO REPORT IMMEDIATELY: A gastroenterologist can be reached at any hour.  During normal business hours, 8:30 AM to 5:00 PM Monday through Friday,  call [phone].  After hours and on weekends, please call the GI answering service at [phone] who will take a message and have the physician on call contact you.   Following lower endoscopy (colonoscopy or flexible sigmoidoscopy):  Excessive amounts of blood in the stool  Significant tenderness or worsening of abdominal pains  Swelling of the abdomen that is new, acute  Fever of 100F or higher  FOLLOW UP: If any biopsies were taken you will be contacted by phone or by letter within the next 1-3 weeks.  Call your gastroenterologist if you have not heard about the biopsies in 3 weeks.  Our staff will call the home number listed on your records the next business day following your procedure to check on you and address any questions or concerns that you may have at that time regarding the information given to you following your procedure. This is a courtesy call and so if there is no answer at the home number and we have not heard from you through the emergency physician on call, we will assume that you have returned to your regular daily activities without incident.  SIGNATURES/CONFIDENTIALITY: You and/or your care partner have signed paperwork which will be entered into your electronic medical record.  These signatures attest to the fact that that the information above on your After Visit Summary has been reviewed and is understood.  Full responsibility of the confidentiality of this   discharge information lies with you and/or your care-partner.  Await pathology  Continue your normal medications  Please read handout about polyps

## 2014-05-22 NOTE — Progress Notes (Signed)
Procedure ends, to recovery, report given and VSS. 

## 2014-05-22 NOTE — Op Note (Signed)
[place]  [address]. [place], [address]   COLONOSCOPY PROCEDURE REPORT  PATIENT: Nancy [name], Nancy [name]  MR#: [id number] BIRTHDATE: 06-26-1966 , 48  yrs. old GENDER: female ENDOSCOPIST: Jerene Bears, MD REFERRED KL:[facility] [place], DO PROCEDURE DATE:  05/22/2014 PROCEDURE:   Colonoscopy with snare polypectomy First Screening Colonoscopy - Avg.  risk and is 50 yrs.  old or older - No.  Prior Negative Screening - Now for repeat screening. N/A  History of Adenoma - Now for follow-up colonoscopy & has been > or = to 3 yrs.  N/A  Polyps Removed Today? Yes. ASA CLASS:   Class II INDICATIONS:heme-positive stool. MEDICATIONS: Monitored anesthesia care and Propofol 250 mg  DESCRIPTION OF PROCEDURE:   After the risks benefits and alternatives of the procedure were thoroughly explained, informed consent was obtained.  The digital rectal exam revealed a skin tag. The LB [id number]  endoscope was introduced through the anus and advanced to the cecum, which was identified by both the appendix and ileocecal valve. No adverse events experienced.   The quality of the prep was good, using MoviPrep  The instrument was then slowly withdrawn as the colon was fully examined.   COLON FINDINGS: A sessile polyp measuring 4 mm in size was found in the sigmoid colon.  A polypectomy was performed with a cold snare. The colon mucosa was otherwise normal.  Retroflexed views revealed no abnormalities. The time to cecum=2 minutes 48 seconds. Withdrawal time=10 minutes 47 seconds.  The scope was withdrawn and the procedure completed. COMPLICATIONS: There were no complications.  ENDOSCOPIC IMPRESSION: 1.   Sessile polyp measuring 4 mm in size was found in the sigmoid colon; polypectomy was performed with a cold snare 2.   The colon mucosa was otherwise normal  RECOMMENDATIONS: 1.  Await pathology results 2.  If the polyp removed today is proven to be an adenomatous (pre-cancerous)  polyp, you will need a repeat colonoscopy in 5 years.  Otherwise you should continue to follow colorectal cancer screening guidelines for "routine risk" patients with colonoscopy in 10 years.  You will receive a letter within 1-2 weeks with the results of your biopsy as well as final recommendations.  Please call my office if you have not received a letter after 3 weeks.  eSigned:  Jerene Bears, MD 05/22/2014 11:38 AM  cc: The Patient, Dr. Etter Sjogren, DO

## 2014-05-23 ENCOUNTER — Telehealth: Payer: Self-pay | Admitting: *Deleted

## 2014-05-23 ENCOUNTER — Ambulatory Visit: Payer: Federal, State, Local not specified - PPO | Admitting: Family Medicine

## 2014-05-23 NOTE — Telephone Encounter (Signed)
No answer, left message to call if questions or concerns. 

## 2014-05-29 ENCOUNTER — Encounter: Payer: Self-pay | Admitting: Internal Medicine

## 2014-06-04 ENCOUNTER — Telehealth: Payer: Self-pay | Admitting: Family Medicine

## 2014-06-04 DIAGNOSIS — Z1231 Encounter for screening mammogram for malignant neoplasm of breast: Secondary | ICD-10-CM

## 2014-06-04 NOTE — Telephone Encounter (Signed)
Caller name: Kaylon Relation to pt: self Call back number: [phone]   Reason for call: pt is requesting a mamo rx

## 2014-06-04 NOTE — Telephone Encounter (Signed)
Order placed, the patient can call and schedule her screening mammogram. Msg left on VM.Marland Kitchen     KP

## 2014-06-04 NOTE — Telephone Encounter (Signed)
Patient has been made aware.      KP 

## 2014-06-05 ENCOUNTER — Telehealth: Payer: Self-pay | Admitting: Internal Medicine

## 2014-06-05 NOTE — Telephone Encounter (Signed)
Discussed results and pathology with pt and her questions were answered.

## 2014-06-18 ENCOUNTER — Other Ambulatory Visit: Payer: Self-pay | Admitting: Family Medicine

## 2014-06-18 DIAGNOSIS — Z1231 Encounter for screening mammogram for malignant neoplasm of breast: Secondary | ICD-10-CM

## 2014-06-20 ENCOUNTER — Ambulatory Visit
Admission: RE | Admit: 2014-06-20 | Discharge: 2014-06-20 | Disposition: A | Discharge status: Home / Self Care | Payer: Federal, State, Local not specified - PPO | Source: Ambulatory Visit | Attending: Family Medicine | Admitting: Family Medicine

## 2014-06-20 DIAGNOSIS — N6310 Unspecified lump in the right breast, unspecified quadrant: Secondary | ICD-10-CM

## 2014-06-20 DIAGNOSIS — Z1231 Encounter for screening mammogram for malignant neoplasm of breast: Secondary | ICD-10-CM

## 2014-06-24 ENCOUNTER — Other Ambulatory Visit: Payer: Self-pay | Admitting: Family Medicine

## 2014-06-24 DIAGNOSIS — N6310 Unspecified lump in the right breast, unspecified quadrant: Secondary | ICD-10-CM

## 2014-06-25 ENCOUNTER — Other Ambulatory Visit: Payer: Self-pay | Admitting: Family Medicine

## 2014-06-25 DIAGNOSIS — R928 Other abnormal and inconclusive findings on diagnostic imaging of breast: Secondary | ICD-10-CM

## 2014-07-05 ENCOUNTER — Ambulatory Visit
Admission: RE | Admit: 2014-07-05 | Discharge: 2014-07-05 | Disposition: A | Discharge status: Home / Self Care | Payer: Federal, State, Local not specified - PPO | Source: Ambulatory Visit | Attending: Family Medicine | Admitting: Family Medicine

## 2014-07-05 DIAGNOSIS — R928 Other abnormal and inconclusive findings on diagnostic imaging of breast: Secondary | ICD-10-CM

## 2014-07-11 ENCOUNTER — Encounter: Payer: Self-pay | Admitting: Family Medicine

## 2014-07-11 ENCOUNTER — Ambulatory Visit (INDEPENDENT_AMBULATORY_CARE_PROVIDER_SITE_OTHER): Payer: Federal, State, Local not specified - PPO | Admitting: Family Medicine

## 2014-07-11 VITALS — BP 121/84 | HR 77 | Temp 98.2°F | Wt 158.0 lb

## 2014-07-11 DIAGNOSIS — L309 Dermatitis, unspecified: Secondary | ICD-10-CM

## 2014-07-11 MED ORDER — TRIAMCINOLONE ACETONIDE 0.1 % EX CREA: 1.0000 "application " | TOPICAL_CREAM | Freq: Two times a day (BID) | CUTANEOUS | Status: DC

## 2014-07-11 NOTE — Progress Notes (Signed)
   Subjective:    Patient ID: Nancy [name], female    DOB: [birth year], 48 y.o.   MRN: [id number]  HPI Pt here c/o itchy rash on r hand x 2 -3 weeks.  Pt denies any new soaps, detergents.  She is using a new mary kay cream on face but there is no rash on her face.  She denies working outside.     Review of Systems As above    Objective:   Physical Exam  BP 121/84 mmHg  Pulse 77  Temp(Src) 98.2 F (36.8 C) (Oral)  Wt 158 lb (71.668 kg)  SpO2 99% General appearance: alert, cooperative, appears stated age and no distress Skin: dry scaly rash with papules on r hand---+ escoriations      Assessment & Plan:  1. Dermatitis due to unknown cause Mix triamcinolone with eucerin cream---rto prn - triamcinolone cream (KENALOG) 0.1 %; Apply 1 application topically 2 (two) times daily.  Dispense: 30 g; Refill: 0

## 2014-07-11 NOTE — Progress Notes (Signed)
Pre visit review using our clinic review tool, if applicable. No additional management support is needed unless otherwise documented below in the visit note. 

## 2014-07-11 NOTE — Patient Instructions (Signed)
Mix triamcinolone 1 part to 3 parts eucerin cream 2x a day  Contact Dermatitis Contact dermatitis is a reaction to certain substances that touch the skin. Contact dermatitis can be either irritant contact dermatitis or allergic contact dermatitis. Irritant contact dermatitis does not require previous exposure to the substance for a reaction to occur.Allergic contact dermatitis only occurs if you have been exposed to the substance before. Upon a repeat exposure, your body reacts to the substance.  CAUSES  Many substances can cause contact dermatitis. Irritant dermatitis is most commonly caused by repeated exposure to mildly irritating substances, such as:  Makeup.  Soaps.  Detergents.  Bleaches.  Acids.  Metal salts, such as nickel. Allergic contact dermatitis is most commonly caused by exposure to:  Poisonous plants.  Chemicals (deodorants, shampoos).  Jewelry.  Latex.  Neomycin in triple antibiotic cream.  Preservatives in products, including clothing. SYMPTOMS  The area of skin that is exposed may develop:  Dryness or flaking.  Redness.  Cracks.  Itching.  Pain or a burning sensation.  Blisters. With allergic contact dermatitis, there may also be swelling in areas such as the eyelids, mouth, or genitals.  DIAGNOSIS  Your caregiver can usually tell what the problem is by doing a physical exam. In cases where the cause is uncertain and an allergic contact dermatitis is suspected, a patch skin test may be performed to help determine the cause of your dermatitis. TREATMENT Treatment includes protecting the skin from further contact with the irritating substance by avoiding that substance if possible. Barrier creams, powders, and gloves may be helpful. Your caregiver may also recommend:  Steroid creams or ointments applied 2 times daily. For best results, soak the rash area in cool water for 20 minutes. Then apply the medicine. Cover the area with a plastic wrap. You  can store the steroid cream in the refrigerator for a "chilly" effect on your rash. That may decrease itching. Oral steroid medicines may be needed in more severe cases.  Antibiotics or antibacterial ointments if a skin infection is present.  Antihistamine lotion or an antihistamine taken by mouth to ease itching.  Lubricants to keep moisture in your skin.  Burow's solution to reduce redness and soreness or to dry a weeping rash. Mix one packet or tablet of solution in 2 cups cool water. Dip a clean washcloth in the mixture, wring it out a bit, and put it on the affected area. Leave the cloth in place for 30 minutes. Do this as often as possible throughout the day.  Taking several cornstarch or baking soda baths daily if the area is too large to cover with a washcloth. Harsh chemicals, such as alkalis or acids, can cause skin damage that is like a burn. You should flush your skin for 15 to 20 minutes with cold water after such an exposure. You should also seek immediate medical care after exposure. Bandages (dressings), antibiotics, and pain medicine may be needed for severely irritated skin.  HOME CARE INSTRUCTIONS  Avoid the substance that caused your reaction.  Keep the area of skin that is affected away from hot water, soap, sunlight, chemicals, acidic substances, or anything else that would irritate your skin.  Do not scratch the rash. Scratching may cause the rash to become infected.  You may take cool baths to help stop the itching.  Only take over-the-counter or prescription medicines as directed by your caregiver.  See your caregiver for follow-up care as directed to make sure your skin is healing properly.  SEEK MEDICAL CARE IF:   Your condition is not better after 3 days of treatment.  You seem to be getting worse.  You see signs of infection such as swelling, tenderness, redness, soreness, or warmth in the affected area.  You have any problems related to your  medicines. Document Released: 08/13/2000 Document Revised: 11/08/2011 Document Reviewed: 01/19/2011 [facility] Patient Information 2015 [place], [place]. This information is not intended to replace advice given to you by your health care provider. Make sure you discuss any questions you have with your health care provider.

## 2014-08-16 ENCOUNTER — Other Ambulatory Visit (INDEPENDENT_AMBULATORY_CARE_PROVIDER_SITE_OTHER): Payer: Self-pay | Admitting: General Surgery

## 2014-09-23 ENCOUNTER — Ambulatory Visit (INDEPENDENT_AMBULATORY_CARE_PROVIDER_SITE_OTHER): Payer: Federal, State, Local not specified - PPO | Admitting: Family Medicine

## 2014-09-23 ENCOUNTER — Encounter: Payer: Self-pay | Admitting: Family Medicine

## 2014-09-23 VITALS — BP 122/82 | HR 69 | Temp 98.5°F | Wt 157.8 lb

## 2014-09-23 DIAGNOSIS — H8113 Benign paroxysmal vertigo, bilateral: Secondary | ICD-10-CM

## 2014-09-23 DIAGNOSIS — J302 Other seasonal allergic rhinitis: Secondary | ICD-10-CM

## 2014-09-23 MED ORDER — TRIAMCINOLONE ACETONIDE 55 MCG/ACT NA AERO: 2.0000 | INHALATION_SPRAY | Freq: Every day | NASAL | Status: DC

## 2014-09-23 MED ORDER — MECLIZINE HCL 50 MG PO TABS: 50.0000 mg | ORAL_TABLET | Freq: Three times a day (TID) | ORAL | Status: DC | PRN

## 2014-09-23 NOTE — Progress Notes (Signed)
Pre visit review using our clinic review tool, if applicable. No additional management support is needed unless otherwise documented below in the visit note. 

## 2014-09-23 NOTE — Patient Instructions (Signed)

## 2014-09-23 NOTE — Progress Notes (Signed)
  Subjective:     Nancy [name] is a 49 y.o. female who presents for evaluation of dizziness. The symptoms started 1 week ago and are ongoing. The attacks occur daily and last most of the day . Positions that worsen symptoms: any motion. Previous workup/treatments: none. Associated ear symptoms: aural pressure. Associated CNS symptoms: none. Recent infections: none. Head trauma: denied. Drug ingestion: none. Noise exposure: no occupational exposure. Family history: non-contributory.  The following portions of the patient's history were reviewed and updated as appropriate:  She  has a past medical history of Fibrocystic breast disease; Atopic eczema; Allergy; Anemia; and GERD (gastroesophageal reflux disease). She  does not have any pertinent problems on file. She  has past surgical history that includes Myomectomy (2002). Her family history includes Breast cancer in her paternal aunt; Diabetes in her father and mother; Gout in her mother; Hypertension in her father. There is no history of Thyroid disease. She  reports that she has never smoked. She has never used smokeless tobacco. She reports that she does not drink alcohol or use illicit drugs. She has a current medication list which includes the following prescription(s): cetirizine, famotidine, triamcinolone cream, alprazolam, meclizine, and triamcinolone. Current Outpatient Prescriptions on File Prior to Visit  Medication Sig Dispense Refill  . cetirizine (ZYRTEC) 10 MG tablet Take 10 mg by mouth daily as needed. For allergies     . famotidine (PEPCID) 10 MG tablet Take 10 mg by mouth daily.    Marland Kitchen triamcinolone cream (KENALOG) 0.1 % Apply 1 application topically 2 (two) times daily. 30 g 0  . ALPRAZolam (XANAX) 0.5 MG tablet Take 0.5 mg by mouth as needed.     No current facility-administered medications on file prior to visit.   She is allergic to penicillins..  Review of Systems Pertinent items are noted in HPI.    Objective:     BP 122/82 mmHg  Pulse 69  Temp(Src) 98.5 F (36.9 C) (Oral)  Wt 157 lb 12.8 oz (71.578 kg)  SpO2 99%  LMP 09/16/2014 General appearance: alert, cooperative, appears stated age and no distress Eyes: conjunctivae/corneas clear. PERRL, EOM's intact. Fundi benign. Ears: normal TM's and external ear canals both ears Nose: clear discharge, no congestion, turbinates pink, red, swollen, no sinus tenderness Throat: abnormal findings: none Neck: no adenopathy, supple, symmetrical, trachea midline and thyroid not enlarged, symmetric, no tenderness/mass/nodules Lungs: clear to auscultation bilaterally      Assessment:    Vertigo    Plan:    Meclizine per medication orders. Decongestants per medication orders. OTC decongestants. f/u prn   1. Benign paroxysmal positional vertigo, bilateral   - meclizine (ANTIVERT) 50 MG tablet; Take 1 tablet (50 mg total) by mouth 3 (three) times daily as needed.  Dispense: 30 tablet; Refill: 0  2. Seasonal allergies con't zyrtec-- maybe be contributing to dizziness - triamcinolone (NASACORT) 55 MCG/ACT AERO nasal inhaler; Place 2 sprays into the nose daily.  Dispense: 1 Inhaler; Refill: 12

## 2015-01-29 ENCOUNTER — Telehealth: Payer: Self-pay | Admitting: Family Medicine

## 2015-01-29 NOTE — Telephone Encounter (Signed)
Pre Visit letter sent  °

## 2015-02-13 ENCOUNTER — Telehealth: Payer: Self-pay | Admitting: *Deleted

## 2015-02-13 NOTE — Telephone Encounter (Signed)
Unable to reach patient at time of Pre-Visit Call.  Left message for patient to return call when available.    

## 2015-02-17 ENCOUNTER — Other Ambulatory Visit (HOSPITAL_COMMUNITY)
Admission: RE | Admit: 2015-02-17 | Discharge: 2015-02-17 | Disposition: A | Discharge status: Home / Self Care | Payer: Federal, State, Local not specified - PPO | Source: Ambulatory Visit | Attending: Family Medicine | Admitting: Family Medicine

## 2015-02-17 ENCOUNTER — Encounter: Payer: Self-pay | Admitting: Family Medicine

## 2015-02-17 ENCOUNTER — Ambulatory Visit (INDEPENDENT_AMBULATORY_CARE_PROVIDER_SITE_OTHER): Payer: Federal, State, Local not specified - PPO | Admitting: Family Medicine

## 2015-02-17 VITALS — BP 115/80 | HR 73 | Temp 98.1°F | Ht 66.0 in | Wt 155.2 lb

## 2015-02-17 DIAGNOSIS — Z124 Encounter for screening for malignant neoplasm of cervix: Secondary | ICD-10-CM

## 2015-02-17 DIAGNOSIS — Z0000 Encounter for general adult medical examination without abnormal findings: Secondary | ICD-10-CM

## 2015-02-17 DIAGNOSIS — Z1151 Encounter for screening for human papillomavirus (HPV): Secondary | ICD-10-CM | POA: Diagnosis present

## 2015-02-17 DIAGNOSIS — Z01419 Encounter for gynecological examination (general) (routine) without abnormal findings: Secondary | ICD-10-CM | POA: Diagnosis not present

## 2015-02-17 LAB — CBC WITH DIFFERENTIAL/PLATELET
Basophils Absolute: 0 10*3/uL (ref 0.0–0.1)
Basophils Relative: 0.7 % (ref 0.0–3.0)
Eosinophils Absolute: 0 10*3/uL (ref 0.0–0.7)
Eosinophils Relative: 1 % (ref 0.0–5.0)
HCT: 43.7 % (ref 36.0–46.0)
Hemoglobin: 14.5 g/dL (ref 12.0–15.0)
Lymphocytes Relative: 55.5 % — ABNORMAL HIGH (ref 12.0–46.0)
Lymphs Abs: 2.4 10*3/uL (ref 0.7–4.0)
MCHC: 33.2 g/dL (ref 30.0–36.0)
MCV: 86.5 fl (ref 78.0–100.0)
Monocytes Absolute: 0.2 10*3/uL (ref 0.1–1.0)
Monocytes Relative: 5.1 % (ref 3.0–12.0)
Neutro Abs: 1.6 10*3/uL (ref 1.4–7.7)
Neutrophils Relative %: 37.7 % — ABNORMAL LOW (ref 43.0–77.0)
Platelets: 230 10*3/uL (ref 150.0–400.0)
RBC: 5.05 Mil/uL (ref 3.87–5.11)
RDW: 14.2 % (ref 11.5–15.5)
WBC: 4.3 10*3/uL (ref 4.0–10.5)

## 2015-02-17 LAB — BASIC METABOLIC PANEL
BUN: 9 mg/dL (ref 6–23)
CO2: 26 mEq/L (ref 19–32)
Calcium: 9.4 mg/dL (ref 8.4–10.5)
Chloride: 107 mEq/L (ref 96–112)
Creatinine, Ser: 0.67 mg/dL (ref 0.40–1.20)
GFR: 120.86 mL/min (ref >60.00)
Glucose, Bld: 86 mg/dL (ref 70–99)
Potassium: 3.9 mEq/L (ref 3.5–5.1)
Sodium: 139 mEq/L (ref 135–145)

## 2015-02-17 LAB — LIPID PANEL
Cholesterol: 157 mg/dL (ref 0–200)
HDL: 61.7 mg/dL (ref >39.00)
LDL Cholesterol: 87 mg/dL (ref 0–99)
NonHDL: 95.3
Total CHOL/HDL Ratio: 3
Triglycerides: 41 mg/dL (ref 0.0–149.0)
VLDL: 8.2 mg/dL (ref 0.0–40.0)

## 2015-02-17 LAB — HEPATIC FUNCTION PANEL
ALT: 16 U/L (ref 0–35)
AST: 19 U/L (ref 0–37)
Albumin: 4.5 g/dL (ref 3.5–5.2)
Alkaline Phosphatase: 36 U/L — ABNORMAL LOW (ref 39–117)
Bilirubin, Direct: 0 mg/dL (ref 0.0–0.3)
Total Bilirubin: 0.5 mg/dL (ref 0.2–1.2)
Total Protein: 7.3 g/dL (ref 6.0–8.3)

## 2015-02-17 LAB — POCT URINALYSIS DIPSTICK
Bilirubin, UA: NEGATIVE
Blood, UA: NEGATIVE
Glucose, UA: NEGATIVE
Ketones, UA: NEGATIVE
Leukocytes, UA: NEGATIVE
Nitrite, UA: NEGATIVE
Protein, UA: NEGATIVE
Spec Grav, UA: 1.01
Urobilinogen, UA: 4
pH, UA: 6.5

## 2015-02-17 LAB — TSH: TSH: 1.16 u[IU]/mL (ref 0.35–4.50)

## 2015-02-17 NOTE — Progress Notes (Signed)
Pre visit review using our clinic review tool, if applicable. No additional management support is needed unless otherwise documented below in the visit note. 

## 2015-02-17 NOTE — Patient Instructions (Signed)
Preventive Care for Adults A healthy lifestyle and preventive care can promote health and wellness. Preventive health guidelines for women include the following key practices.  A routine yearly physical is a good way to check with your health care provider about your health and preventive screening. It is a chance to share any concerns and updates on your health and to receive a thorough exam.  Visit your dentist for a routine exam and preventive care every 6 months. Brush your teeth twice a day and floss once a day. Good oral hygiene prevents tooth decay and gum disease.  The frequency of eye exams is based on your age, health, family medical history, use of contact lenses, and other factors. Follow your health care provider's recommendations for frequency of eye exams.  Eat a healthy diet. Foods like vegetables, fruits, whole grains, low-fat dairy products, and lean protein foods contain the nutrients you need without too many calories. Decrease your intake of foods high in solid fats, added sugars, and salt. Eat the right amount of calories for you.Get information about a proper diet from your health care provider, if necessary.  Regular physical exercise is one of the most important things you can do for your health. Most adults should get at least 150 minutes of moderate-intensity exercise (any activity that increases your heart rate and causes you to sweat) each week. In addition, most adults need muscle-strengthening exercises on 2 or more days a week.  Maintain a healthy weight. The body mass index (BMI) is a screening tool to identify possible weight problems. It provides an estimate of body fat based on height and weight. Your health care provider can find your BMI and can help you achieve or maintain a healthy weight.For adults 20 years and older:  A BMI below 18.5 is considered underweight.  A BMI of 18.5 to 24.9 is normal.  A BMI of 25 to 29.9 is considered overweight.  A BMI of  30 and above is considered obese.  Maintain normal blood lipids and cholesterol levels by exercising and minimizing your intake of saturated fat. Eat a balanced diet with plenty of fruit and vegetables. Blood tests for lipids and cholesterol should begin at age 76 and be repeated every 5 years. If your lipid or cholesterol levels are high, you are over 50, or you are at high risk for heart disease, you may need your cholesterol levels checked more frequently.Ongoing high lipid and cholesterol levels should be treated with medicines if diet and exercise are not working.  If you smoke, find out from your health care provider how to quit. If you do not use tobacco, do not start.  Lung cancer screening is recommended for adults aged 22-80 years who are at high risk for developing lung cancer because of a history of smoking. A yearly low-dose CT scan of the lungs is recommended for people who have at least a 30-pack-year history of smoking and are a current smoker or have quit within the past 15 years. A pack year of smoking is smoking an average of 1 pack of cigarettes a day for 1 year (for example: 1 pack a day for 30 years or 2 packs a day for 15 years). Yearly screening should continue until the smoker has stopped smoking for at least 15 years. Yearly screening should be stopped for people who develop a health problem that would prevent them from having lung cancer treatment.  If you are pregnant, do not drink alcohol. If you are breastfeeding,  be very cautious about drinking alcohol. If you are not pregnant and choose to drink alcohol, do not have more than 1 drink per day. One drink is considered to be 12 ounces (355 mL) of beer, 5 ounces (148 mL) of wine, or 1.5 ounces (44 mL) of liquor.  Avoid use of street drugs. Do not share needles with anyone. Ask for help if you need support or instructions about stopping the use of drugs.  High blood pressure causes heart disease and increases the risk of  stroke. Your blood pressure should be checked at least every 1 to 2 years. Ongoing high blood pressure should be treated with medicines if weight loss and exercise do not work.  If you are 75-52 years old, ask your health care provider if you should take aspirin to prevent strokes.  Diabetes screening involves taking a blood sample to check your fasting blood sugar level. This should be done once every 3 years, after age 15, if you are within normal weight and without risk factors for diabetes. Testing should be considered at a younger age or be carried out more frequently if you are overweight and have at least 1 risk factor for diabetes.  Breast cancer screening is essential preventive care for women. You should practice "breast self-awareness." This means understanding the normal appearance and feel of your breasts and may include breast self-examination. Any changes detected, no matter how small, should be reported to a health care provider. Women in their 58s and 30s should have a clinical breast exam (CBE) by a health care provider as part of a regular health exam every 1 to 3 years. After age 16, women should have a CBE every year. Starting at age 53, women should consider having a mammogram (breast X-ray test) every year. Women who have a family history of breast cancer should talk to their health care provider about genetic screening. Women at a high risk of breast cancer should talk to their health care providers about having an MRI and a mammogram every year.  Breast cancer gene (BRCA)-related cancer risk assessment is recommended for women who have family members with BRCA-related cancers. BRCA-related cancers include breast, ovarian, tubal, and peritoneal cancers. Having family members with these cancers may be associated with an increased risk for harmful changes (mutations) in the breast cancer genes BRCA1 and BRCA2. Results of the assessment will determine the need for genetic counseling and  BRCA1 and BRCA2 testing.  Routine pelvic exams to screen for cancer are no longer recommended for nonpregnant women who are considered low risk for cancer of the pelvic organs (ovaries, uterus, and vagina) and who do not have symptoms. Ask your health care provider if a screening pelvic exam is right for you.  If you have had past treatment for cervical cancer or a condition that could lead to cancer, you need Pap tests and screening for cancer for at least 20 years after your treatment. If Pap tests have been discontinued, your risk factors (such as having a new sexual partner) need to be reassessed to determine if screening should be resumed. Some women have medical problems that increase the chance of getting cervical cancer. In these cases, your health care provider may recommend more frequent screening and Pap tests.  The HPV test is an additional test that may be used for cervical cancer screening. The HPV test looks for the virus that can cause the cell changes on the cervix. The cells collected during the Pap test can be  tested for HPV. The HPV test could be used to screen women aged 30 years and older, and should be used in women of any age who have unclear Pap test results. After the age of 30, women should have HPV testing at the same frequency as a Pap test.  Colorectal cancer can be detected and often prevented. Most routine colorectal cancer screening begins at the age of 50 years and continues through age 75 years. However, your health care provider may recommend screening at an earlier age if you have risk factors for colon cancer. On a yearly basis, your health care provider may provide home test kits to check for hidden blood in the stool. Use of a small camera at the end of a tube, to directly examine the colon (sigmoidoscopy or colonoscopy), can detect the earliest forms of colorectal cancer. Talk to your health care provider about this at age 50, when routine screening begins. Direct  exam of the colon should be repeated every 5-10 years through age 75 years, unless early forms of pre-cancerous polyps or small growths are found.  People who are at an increased risk for hepatitis B should be screened for this virus. You are considered at high risk for hepatitis B if:  You were born in a country where hepatitis B occurs often. Talk with your health care provider about which countries are considered high risk.  Your parents were born in a high-risk country and you have not received a shot to protect against hepatitis B (hepatitis B vaccine).  You have HIV or AIDS.  You use needles to inject street drugs.  You live with, or have sex with, someone who has hepatitis B.  You get hemodialysis treatment.  You take certain medicines for conditions like cancer, organ transplantation, and autoimmune conditions.  Hepatitis C blood testing is recommended for all people born from 1945 through 1965 and any individual with known risks for hepatitis C.  Practice safe sex. Use condoms and avoid high-risk sexual practices to reduce the spread of sexually transmitted infections (STIs). STIs include gonorrhea, chlamydia, syphilis, trichomonas, herpes, HPV, and human immunodeficiency virus (HIV). Herpes, HIV, and HPV are viral illnesses that have no cure. They can result in disability, cancer, and death.  You should be screened for sexually transmitted illnesses (STIs) including gonorrhea and chlamydia if:  You are sexually active and are younger than 24 years.  You are older than 24 years and your health care provider tells you that you are at risk for this type of infection.  Your sexual activity has changed since you were last screened and you are at an increased risk for chlamydia or gonorrhea. Ask your health care provider if you are at risk.  If you are at risk of being infected with HIV, it is recommended that you take a prescription medicine daily to prevent HIV infection. This is  called preexposure prophylaxis (PrEP). You are considered at risk if:  You are a heterosexual woman, are sexually active, and are at increased risk for HIV infection.  You take drugs by injection.  You are sexually active with a partner who has HIV.  Talk with your health care provider about whether you are at high risk of being infected with HIV. If you choose to begin PrEP, you should first be tested for HIV. You should then be tested every 3 months for as long as you are taking PrEP.  Osteoporosis is a disease in which the bones lose minerals and strength   with aging. This can result in serious bone fractures or breaks. The risk of osteoporosis can be identified using a bone density scan. Women ages 65 years and over and women at risk for fractures or osteoporosis should discuss screening with their health care providers. Ask your health care provider whether you should take a calcium supplement or vitamin D to reduce the rate of osteoporosis.  Menopause can be associated with physical symptoms and risks. Hormone replacement therapy is available to decrease symptoms and risks. You should talk to your health care provider about whether hormone replacement therapy is right for you.  Use sunscreen. Apply sunscreen liberally and repeatedly throughout the day. You should seek shade when your shadow is shorter than you. Protect yourself by wearing long sleeves, pants, a wide-brimmed hat, and sunglasses year round, whenever you are outdoors.  Once a month, do a whole body skin exam, using a mirror to look at the skin on your back. Tell your health care provider of new moles, moles that have irregular borders, moles that are larger than a pencil eraser, or moles that have changed in shape or color.  Stay current with required vaccines (immunizations).  Influenza vaccine. All adults should be immunized every year.  Tetanus, diphtheria, and acellular pertussis (Td, Tdap) vaccine. Pregnant women should  receive 1 dose of Tdap vaccine during each pregnancy. The dose should be obtained regardless of the length of time since the last dose. Immunization is preferred during the 27th-36th week of gestation. An adult who has not previously received Tdap or who does not know her vaccine status should receive 1 dose of Tdap. This initial dose should be followed by tetanus and diphtheria toxoids (Td) booster doses every 10 years. Adults with an unknown or incomplete history of completing a 3-dose immunization series with Td-containing vaccines should begin or complete a primary immunization series including a Tdap dose. Adults should receive a Td booster every 10 years.  Varicella vaccine. An adult without evidence of immunity to varicella should receive 2 doses or a second dose if she has previously received 1 dose. Pregnant females who do not have evidence of immunity should receive the first dose after pregnancy. This first dose should be obtained before leaving the health care facility. The second dose should be obtained 4-8 weeks after the first dose.  Human papillomavirus (HPV) vaccine. Females aged 13-26 years who have not received the vaccine previously should obtain the 3-dose series. The vaccine is not recommended for use in pregnant females. However, pregnancy testing is not needed before receiving a dose. If a female is found to be pregnant after receiving a dose, no treatment is needed. In that case, the remaining doses should be delayed until after the pregnancy. Immunization is recommended for any person with an immunocompromised condition through the age of 26 years if she did not get any or all doses earlier. During the 3-dose series, the second dose should be obtained 4-8 weeks after the first dose. The third dose should be obtained 24 weeks after the first dose and 16 weeks after the second dose.  Zoster vaccine. One dose is recommended for adults aged 60 years or older unless certain conditions are  present.  Measles, mumps, and rubella (MMR) vaccine. Adults born before 1957 generally are considered immune to measles and mumps. Adults born in 1957 or later should have 1 or more doses of MMR vaccine unless there is a contraindication to the vaccine or there is laboratory evidence of immunity to   each of the three diseases. A routine second dose of MMR vaccine should be obtained at least 28 days after the first dose for students attending postsecondary schools, health care workers, or international travelers. People who received inactivated measles vaccine or an unknown type of measles vaccine during 1963-1967 should receive 2 doses of MMR vaccine. People who received inactivated mumps vaccine or an unknown type of mumps vaccine before 1979 and are at high risk for mumps infection should consider immunization with 2 doses of MMR vaccine. For females of childbearing age, rubella immunity should be determined. If there is no evidence of immunity, females who are not pregnant should be vaccinated. If there is no evidence of immunity, females who are pregnant should delay immunization until after pregnancy. Unvaccinated health care workers born before 1957 who lack laboratory evidence of measles, mumps, or rubella immunity or laboratory confirmation of disease should consider measles and mumps immunization with 2 doses of MMR vaccine or rubella immunization with 1 dose of MMR vaccine.  Pneumococcal 13-valent conjugate (PCV13) vaccine. When indicated, a person who is uncertain of her immunization history and has no record of immunization should receive the PCV13 vaccine. An adult aged 19 years or older who has certain medical conditions and has not been previously immunized should receive 1 dose of PCV13 vaccine. This PCV13 should be followed with a dose of pneumococcal polysaccharide (PPSV23) vaccine. The PPSV23 vaccine dose should be obtained at least 8 weeks after the dose of PCV13 vaccine. An adult aged 19  years or older who has certain medical conditions and previously received 1 or more doses of PPSV23 vaccine should receive 1 dose of PCV13. The PCV13 vaccine dose should be obtained 1 or more years after the last PPSV23 vaccine dose.  Pneumococcal polysaccharide (PPSV23) vaccine. When PCV13 is also indicated, PCV13 should be obtained first. All adults aged 65 years and older should be immunized. An adult younger than age 65 years who has certain medical conditions should be immunized. Any person who resides in a nursing home or long-term care facility should be immunized. An adult smoker should be immunized. People with an immunocompromised condition and certain other conditions should receive both PCV13 and PPSV23 vaccines. People with human immunodeficiency virus (HIV) infection should be immunized as soon as possible after diagnosis. Immunization during chemotherapy or radiation therapy should be avoided. Routine use of PPSV23 vaccine is not recommended for American Indians, [place] Natives, or people younger than 65 years unless there are medical conditions that require PPSV23 vaccine. When indicated, people who have unknown immunization and have no record of immunization should receive PPSV23 vaccine. One-time revaccination 5 years after the first dose of PPSV23 is recommended for people aged 19-64 years who have chronic kidney failure, nephrotic syndrome, asplenia, or immunocompromised conditions. People who received 1-2 doses of PPSV23 before age 65 years should receive another dose of PPSV23 vaccine at age 65 years or later if at least 5 years have passed since the previous dose. Doses of PPSV23 are not needed for people immunized with PPSV23 at or after age 65 years.  Meningococcal vaccine. Adults with asplenia or persistent complement component deficiencies should receive 2 doses of quadrivalent meningococcal conjugate (MenACWY-D) vaccine. The doses should be obtained at least 2 months apart.  Microbiologists working with certain meningococcal bacteria, military recruits, people at risk during an outbreak, and people who travel to or live in countries with a high rate of meningitis should be immunized. A first-year college student up through age   21 years who is living in a residence hall should receive a dose if she did not receive a dose on or after her 16th birthday. Adults who have certain high-risk conditions should receive one or more doses of vaccine.  Hepatitis A vaccine. Adults who wish to be protected from this disease, have certain high-risk conditions, work with hepatitis A-infected animals, work in hepatitis A research labs, or travel to or work in countries with a high rate of hepatitis A should be immunized. Adults who were previously unvaccinated and who anticipate close contact with an international adoptee during the first 60 days after arrival in the [place] from a country with a high rate of hepatitis A should be immunized.  Hepatitis B vaccine. Adults who wish to be protected from this disease, have certain high-risk conditions, may be exposed to blood or other infectious body fluids, are household contacts or sex partners of hepatitis B positive people, are clients or workers in certain care facilities, or travel to or work in countries with a high rate of hepatitis B should be immunized.  Haemophilus influenzae type b (Hib) vaccine. A previously unvaccinated person with asplenia or sickle cell disease or having a scheduled splenectomy should receive 1 dose of Hib vaccine. Regardless of previous immunization, a recipient of a hematopoietic stem cell transplant should receive a 3-dose series 6-12 months after her successful transplant. Hib vaccine is not recommended for adults with HIV infection. Preventive Services / Frequency Ages 64 to 68 years  Blood pressure check.** / Every 1 to 2 years.  Lipid and cholesterol check.** / Every 5 years beginning at age  22.  Clinical breast exam.** / Every 3 years for women in their 88s and 53s.  BRCA-related cancer risk assessment.** / For women who have family members with a BRCA-related cancer (breast, ovarian, tubal, or peritoneal cancers).  Pap test.** / Every 2 years from ages 90 through 51. Every 3 years starting at age 21 through age 56 or 3 with a history of 3 consecutive normal Pap tests.  HPV screening.** / Every 3 years from ages 24 through ages 1 to 46 with a history of 3 consecutive normal Pap tests.  Hepatitis C blood test.** / For any individual with known risks for hepatitis C.  Skin self-exam. / Monthly.  Influenza vaccine. / Every year.  Tetanus, diphtheria, and acellular pertussis (Tdap, Td) vaccine.** / Consult your health care provider. Pregnant women should receive 1 dose of Tdap vaccine during each pregnancy. 1 dose of Td every 10 years.  Varicella vaccine.** / Consult your health care provider. Pregnant females who do not have evidence of immunity should receive the first dose after pregnancy.  HPV vaccine. / 3 doses over 6 months, if 72 and younger. The vaccine is not recommended for use in pregnant females. However, pregnancy testing is not needed before receiving a dose.  Measles, mumps, rubella (MMR) vaccine.** / You need at least 1 dose of MMR if you were born in 1957 or later. You may also need a 2nd dose. For females of childbearing age, rubella immunity should be determined. If there is no evidence of immunity, females who are not pregnant should be vaccinated. If there is no evidence of immunity, females who are pregnant should delay immunization until after pregnancy.  Pneumococcal 13-valent conjugate (PCV13) vaccine.** / Consult your health care provider.  Pneumococcal polysaccharide (PPSV23) vaccine.** / 1 to 2 doses if you smoke cigarettes or if you have certain conditions.  Meningococcal vaccine.** /  1 dose if you are age 19 to 21 years and a first-year college  student living in a residence hall, or have one of several medical conditions, you need to get vaccinated against meningococcal disease. You may also need additional booster doses.  Hepatitis A vaccine.** / Consult your health care provider.  Hepatitis B vaccine.** / Consult your health care provider.  Haemophilus influenzae type b (Hib) vaccine.** / Consult your health care provider. Ages 40 to 64 years  Blood pressure check.** / Every 1 to 2 years.  Lipid and cholesterol check.** / Every 5 years beginning at age 20 years.  Lung cancer screening. / Every year if you are aged 55-80 years and have a 30-pack-year history of smoking and currently smoke or have quit within the past 15 years. Yearly screening is stopped once you have quit smoking for at least 15 years or develop a health problem that would prevent you from having lung cancer treatment.  Clinical breast exam.** / Every year after age 40 years.  BRCA-related cancer risk assessment.** / For women who have family members with a BRCA-related cancer (breast, ovarian, tubal, or peritoneal cancers).  Mammogram.** / Every year beginning at age 40 years and continuing for as long as you are in good health. Consult with your health care provider.  Pap test.** / Every 3 years starting at age 30 years through age 65 or 70 years with a history of 3 consecutive normal Pap tests.  HPV screening.** / Every 3 years from ages 30 years through ages 65 to 70 years with a history of 3 consecutive normal Pap tests.  Fecal occult blood test (FOBT) of stool. / Every year beginning at age 50 years and continuing until age 75 years. You may not need to do this test if you get a colonoscopy every 10 years.  Flexible sigmoidoscopy or colonoscopy.** / Every 5 years for a flexible sigmoidoscopy or every 10 years for a colonoscopy beginning at age 50 years and continuing until age 75 years.  Hepatitis C blood test.** / For all people born from 1945 through  1965 and any individual with known risks for hepatitis C.  Skin self-exam. / Monthly.  Influenza vaccine. / Every year.  Tetanus, diphtheria, and acellular pertussis (Tdap/Td) vaccine.** / Consult your health care provider. Pregnant women should receive 1 dose of Tdap vaccine during each pregnancy. 1 dose of Td every 10 years.  Varicella vaccine.** / Consult your health care provider. Pregnant females who do not have evidence of immunity should receive the first dose after pregnancy.  Zoster vaccine.** / 1 dose for adults aged 60 years or older.  Measles, mumps, rubella (MMR) vaccine.** / You need at least 1 dose of MMR if you were born in 1957 or later. You may also need a 2nd dose. For females of childbearing age, rubella immunity should be determined. If there is no evidence of immunity, females who are not pregnant should be vaccinated. If there is no evidence of immunity, females who are pregnant should delay immunization until after pregnancy.  Pneumococcal 13-valent conjugate (PCV13) vaccine.** / Consult your health care provider.  Pneumococcal polysaccharide (PPSV23) vaccine.** / 1 to 2 doses if you smoke cigarettes or if you have certain conditions.  Meningococcal vaccine.** / Consult your health care provider.  Hepatitis A vaccine.** / Consult your health care provider.  Hepatitis B vaccine.** / Consult your health care provider.  Haemophilus influenzae type b (Hib) vaccine.** / Consult your health care provider. Ages 65   years and over  Blood pressure check.** / Every 1 to 2 years.  Lipid and cholesterol check.** / Every 5 years beginning at age 22 years.  Lung cancer screening. / Every year if you are aged 73-80 years and have a 30-pack-year history of smoking and currently smoke or have quit within the past 15 years. Yearly screening is stopped once you have quit smoking for at least 15 years or develop a health problem that would prevent you from having lung cancer  treatment.  Clinical breast exam.** / Every year after age 4 years.  BRCA-related cancer risk assessment.** / For women who have family members with a BRCA-related cancer (breast, ovarian, tubal, or peritoneal cancers).  Mammogram.** / Every year beginning at age 40 years and continuing for as long as you are in good health. Consult with your health care provider.  Pap test.** / Every 3 years starting at age 9 years through age 34 or 91 years with 3 consecutive normal Pap tests. Testing can be stopped between 65 and 70 years with 3 consecutive normal Pap tests and no abnormal Pap or HPV tests in the past 10 years.  HPV screening.** / Every 3 years from ages 57 years through ages 64 or 45 years with a history of 3 consecutive normal Pap tests. Testing can be stopped between 65 and 70 years with 3 consecutive normal Pap tests and no abnormal Pap or HPV tests in the past 10 years.  Fecal occult blood test (FOBT) of stool. / Every year beginning at age 15 years and continuing until age 17 years. You may not need to do this test if you get a colonoscopy every 10 years.  Flexible sigmoidoscopy or colonoscopy.** / Every 5 years for a flexible sigmoidoscopy or every 10 years for a colonoscopy beginning at age 86 years and continuing until age 71 years.  Hepatitis C blood test.** / For all people born from [birth year] through 1964 and any individual with known risks for hepatitis C.  Osteoporosis screening.** / A one-time screening for women ages 83 years and over and women at risk for fractures or osteoporosis.  Skin self-exam. / Monthly.  Influenza vaccine. / Every year.  Tetanus, diphtheria, and acellular pertussis (Tdap/Td) vaccine.** / 1 dose of Td every 10 years.  Varicella vaccine.** / Consult your health care provider.  Zoster vaccine.** / 1 dose for adults aged 61 years or older.  Pneumococcal 13-valent conjugate (PCV13) vaccine.** / Consult your health care provider.  Pneumococcal  polysaccharide (PPSV23) vaccine.** / 1 dose for all adults aged 28 years and older.  Meningococcal vaccine.** / Consult your health care provider.  Hepatitis A vaccine.** / Consult your health care provider.  Hepatitis B vaccine.** / Consult your health care provider.  Haemophilus influenzae type b (Hib) vaccine.** / Consult your health care provider. ** Family history and personal history of risk and conditions may change your health care provider's recommendations. Document Released: 10/12/2001 Document Revised: 12/31/2013 Document Reviewed: 01/11/2011 [facility] Patient Information 2015 [place], [place]. This information is not intended to replace advice given to you by your health care provider. Make sure you discuss any questions you have with your health care provider.

## 2015-02-17 NOTE — Progress Notes (Signed)
Subjective:     Nancy [name] is a 49 y.o. female and is here for a comprehensive physical exam. The patient reports no problems.  History   Social History  . Marital Status: Married    Spouse Name: N/A  . Number of Children: 0  . Years of Education: 18   Occupational History  . [employer]  .     Social History Main Topics  . Smoking status: Never Smoker   . Smokeless tobacco: Never Used  . Alcohol Use: No  . Drug Use: No  . Sexual Activity:    Partners: Male   Other Topics Concern  . Not on file   Social History Narrative   Health Maintenance  Topic Date Due  . INFLUENZA VACCINE  07/12/2015 (Originally 03/31/2015)  . HIV Screening  02/17/2016 (Originally 03/24/1982)  . MAMMOGRAM  06/21/2015  . PAP SMEAR  02/12/2017  . COLONOSCOPY  05/23/2019  . TETANUS/TDAP  01/25/2023    The following portions of the patient's history were reviewed and updated as appropriate:  She  has a past medical history of Fibrocystic breast disease; Atopic eczema; Allergy; Anemia; and GERD (gastroesophageal reflux disease). She  does not have any pertinent problems on file. She  has past surgical history that includes Myomectomy (2002). Her family history includes Breast cancer in her paternal aunt; Diabetes in her father and mother; Gout in her mother; Hypertension in her father. There is no history of Thyroid disease. She  reports that she has never smoked. She has never used smokeless tobacco. She reports that she does not drink alcohol or use illicit drugs. She has a current medication list which includes the following prescription(s): alprazolam, cetirizine, famotidine, and meclizine. Current Outpatient Prescriptions on File Prior to Visit  Medication Sig Dispense Refill  . ALPRAZolam (XANAX) 0.5 MG tablet Take 0.5 mg by mouth as needed.    . cetirizine (ZYRTEC) 10 MG tablet Take 10 mg by mouth daily as needed. For allergies     . famotidine (PEPCID) 10 MG tablet Take  10 mg by mouth daily.    . meclizine (ANTIVERT) 50 MG tablet Take 1 tablet (50 mg total) by mouth 3 (three) times daily as needed. 30 tablet 0   No current facility-administered medications on file prior to visit.   She is allergic to penicillins..  Review of Systems Review of Systems  Constitutional: Negative for activity change, appetite change and fatigue.  HENT: Negative for hearing loss, congestion, tinnitus and ear discharge.  dentist q41mEyes: Negative for visual disturbance (see optho q2y -- vision corrected to 20/20 with glasses).  Respiratory: Negative for cough, chest tightness and shortness of breath.   Cardiovascular: Negative for chest pain, palpitations and leg swelling.  Gastrointestinal: Negative for abdominal pain, diarrhea, constipation and abdominal distention.  Genitourinary: Negative for urgency, frequency, decreased urine volume and difficulty urinating.  Musculoskeletal: Negative for back pain, arthralgias and gait problem.  Skin: Negative for color change, pallor and rash.  Neurological: Negative for dizziness, light-headedness, numbness and headaches.  Hematological: Negative for adenopathy. Does not bruise/bleed easily.  Psychiatric/Behavioral: Negative for suicidal ideas, confusion, sleep disturbance, self-injury, dysphoric mood, decreased concentration and agitation.       Objective:    BP 115/80 mmHg  Pulse 73  Temp(Src) 98.1 F (36.7 C) (Oral)  Ht '5\' 6"'$  (1.676 m)  Wt 155 lb 3.2 oz (70.398 kg)  BMI 25.06 kg/m2  SpO2 97%  LMP 02/10/2015 General appearance: alert, cooperative,  appears stated age and no distress Head: Normocephalic, without obvious abnormality, atraumatic Eyes: conjunctivae/corneas clear. PERRL, EOM's intact. Fundi benign. Ears: normal TM's and external ear canals both ears Nose: Nares normal. Septum midline. Mucosa normal. No drainage or sinus tenderness. Throat: lips, mucosa, and tongue normal; teeth and gums normal Neck: no  adenopathy, no carotid bruit, no JVD, supple, symmetrical, trachea midline and thyroid not enlarged, symmetric, no tenderness/mass/nodules Back: symmetric, no curvature. ROM normal. No CVA tenderness. Lungs: clear to auscultation bilaterally Breasts: very dense, lumpy breasts Heart: regular rate and rhythm, S1, S2 normal, no murmur, click, rub or gallop Abdomen: soft, non-tender; bowel sounds normal; no masses,  no organomegaly Pelvic: cervix normal in appearance, external genitalia normal, no adnexal masses or tenderness, no cervical motion tenderness, rectovaginal septum normal, uterus normal size, shape, and consistency, vagina normal without discharge and pap done, rectal heme neg brown stool Extremities: extremities normal, atraumatic, no cyanosis or edema Pulses: 2+ and symmetric Skin: Skin color, texture, turgor normal. No rashes or lesions Lympnodes: Cervical, supraclavicular, and axillary nodes normal. Neurologic: Alert and oriented X 3, normal strength and tone. Normal symmetric reflexes. Normal coordination and gait Psych- no depression, no anxiety      Assessment:    Healthy female exam.       Plan:  Check labs   ghm utd See After Visit Summary for Counseling Recommendations

## 2015-02-17 NOTE — Addendum Note (Signed)
Addended by: Rudene Anda on: 02/17/2015 09:07 AM   Modules accepted: Orders

## 2015-02-18 LAB — CYTOLOGY - PAP

## 2015-02-27 ENCOUNTER — Telehealth: Payer: Self-pay | Admitting: Family Medicine

## 2015-02-27 NOTE — Telephone Encounter (Signed)
They are only slightly off and wbc is normal--- her numbers have been in that range for years--- - if it was way out of range I would be concerned but not with it being just slightly off

## 2015-02-27 NOTE — Telephone Encounter (Signed)
Caller name: Xoey Relation to pt: Call back number: [phone] Pharmacy:  Reason for call:   Patient has questions regarding lab results that were mailed to her

## 2015-02-27 NOTE — Telephone Encounter (Signed)
Patient aware and verbalized understanding, no further problems or concerns at this time.       KP

## 2015-02-27 NOTE — Telephone Encounter (Signed)
Spoke with patient and she is concerned about her elevated Lymphocytes and low neutrophils, she wants to know if there is something that you suggest or what could be the cause.  Please advise  KP  Neutrophils Relative % 43.0 - 77.0 % 37.7 (L) 40.7 (L) 46.2 34.6 (L) 36.4 (L)R  Lymphocytes Relative 12.0 - 46.0 % 55.5 Manual sme... (H) 52.1 (H) 47.3 (H) 59.1 (H) 57.7 H % (H)R

## 2015-06-09 ENCOUNTER — Emergency Department (HOSPITAL_COMMUNITY): Payer: Federal, State, Local not specified - PPO

## 2015-06-09 ENCOUNTER — Observation Stay (HOSPITAL_COMMUNITY)
Admission: EM | Admit: 2015-06-09 | Discharge: 2015-06-10 | Disposition: A | Discharge status: Home / Self Care | Payer: Federal, State, Local not specified - PPO | Attending: Internal Medicine | Admitting: Internal Medicine

## 2015-06-09 ENCOUNTER — Encounter (HOSPITAL_COMMUNITY): Payer: Self-pay | Admitting: Emergency Medicine

## 2015-06-09 ENCOUNTER — Encounter: Payer: Self-pay | Admitting: Medical

## 2015-06-09 ENCOUNTER — Ambulatory Visit (INDEPENDENT_AMBULATORY_CARE_PROVIDER_SITE_OTHER): Payer: Federal, State, Local not specified - PPO | Admitting: Medical

## 2015-06-09 VITALS — BP 139/84 | HR 70 | Ht 66.0 in | Wt 158.6 lb

## 2015-06-09 DIAGNOSIS — Q2730 Arteriovenous malformation, site unspecified: Secondary | ICD-10-CM

## 2015-06-09 DIAGNOSIS — H532 Diplopia: Secondary | ICD-10-CM

## 2015-06-09 DIAGNOSIS — G459 Transient cerebral ischemic attack, unspecified: Secondary | ICD-10-CM | POA: Diagnosis not present

## 2015-06-09 DIAGNOSIS — Z79899 Other long term (current) drug therapy: Secondary | ICD-10-CM | POA: Insufficient documentation

## 2015-06-09 DIAGNOSIS — H579 Unspecified disorder of eye and adnexa: Secondary | ICD-10-CM

## 2015-06-09 DIAGNOSIS — K219 Gastro-esophageal reflux disease without esophagitis: Secondary | ICD-10-CM | POA: Insufficient documentation

## 2015-06-09 DIAGNOSIS — H8110 Benign paroxysmal vertigo, unspecified ear: Secondary | ICD-10-CM | POA: Insufficient documentation

## 2015-06-09 DIAGNOSIS — R42 Dizziness and giddiness: Secondary | ICD-10-CM | POA: Diagnosis not present

## 2015-06-09 DIAGNOSIS — R110 Nausea: Secondary | ICD-10-CM

## 2015-06-09 DIAGNOSIS — E785 Hyperlipidemia, unspecified: Secondary | ICD-10-CM | POA: Insufficient documentation

## 2015-06-09 LAB — I-STAT CHEM 8, ED
BUN: 13 mg/dL (ref 6–20)
Calcium, Ion: 1.24 mmol/L — ABNORMAL HIGH (ref 1.12–1.23)
Chloride: 103 mmol/L (ref 101–111)
Creatinine, Ser: 0.8 mg/dL (ref 0.44–1.00)
Glucose, Bld: 86 mg/dL (ref 65–99)
HCT: 43 % (ref 36.0–46.0)
Hemoglobin: 14.6 g/dL (ref 12.0–15.0)
Potassium: 3.8 mmol/L (ref 3.5–5.1)
Sodium: 141 mmol/L (ref 135–145)
TCO2: 24 mmol/L (ref 0–100)

## 2015-06-09 LAB — CBC
HCT: 41.1 % (ref 36.0–46.0)
Hemoglobin: 13.8 g/dL (ref 12.0–15.0)
MCH: 28.9 pg (ref 26.0–34.0)
MCHC: 33.6 g/dL (ref 30.0–36.0)
MCV: 86 fL (ref 78.0–100.0)
Platelets: 227 10*3/uL (ref 150–400)
RBC: 4.78 MIL/uL (ref 3.87–5.11)
RDW: 12.9 % (ref 11.5–15.5)
WBC: 6 10*3/uL (ref 4.0–10.5)

## 2015-06-09 LAB — COMPREHENSIVE METABOLIC PANEL
ALT: 16 U/L (ref 14–54)
AST: 21 U/L (ref 15–41)
Albumin: 4.2 g/dL (ref 3.5–5.0)
Alkaline Phosphatase: 37 U/L — ABNORMAL LOW (ref 38–126)
Anion gap: 12 (ref 5–15)
BUN: 12 mg/dL (ref 6–20)
CO2: 21 mmol/L — ABNORMAL LOW (ref 22–32)
Calcium: 9.2 mg/dL (ref 8.9–10.3)
Chloride: 104 mmol/L (ref 101–111)
Creatinine, Ser: 0.72 mg/dL (ref 0.44–1.00)
GFR calc Af Amer: >60 mL/min (ref >60)
GFR calc non Af Amer: >60 mL/min (ref >60)
Glucose, Bld: 86 mg/dL (ref 65–99)
Potassium: 3.8 mmol/L (ref 3.5–5.1)
Sodium: 137 mmol/L (ref 135–145)
Total Bilirubin: 0.6 mg/dL (ref 0.3–1.2)
Total Protein: 7 g/dL (ref 6.5–8.1)

## 2015-06-09 LAB — PROTIME-INR
INR: 1.09 (ref 0.00–1.49)
Prothrombin Time: 14.2 seconds (ref 11.6–15.2)

## 2015-06-09 LAB — DIFFERENTIAL
Basophils Absolute: 0 10*3/uL (ref 0.0–0.1)
Basophils Relative: 0 %
Eosinophils Absolute: 0.1 10*3/uL (ref 0.0–0.7)
Eosinophils Relative: 2 %
Lymphocytes Relative: 61 %
Lymphs Abs: 3.7 10*3/uL (ref 0.7–4.0)
Monocytes Absolute: 0.3 10*3/uL (ref 0.1–1.0)
Monocytes Relative: 5 %
Neutro Abs: 1.9 10*3/uL (ref 1.7–7.7)
Neutrophils Relative %: 32 %

## 2015-06-09 LAB — I-STAT TROPONIN, ED: Troponin i, poc: 0 ng/mL (ref 0.00–0.08)

## 2015-06-09 LAB — CBG MONITORING, ED: Glucose-Capillary: 88 mg/dL (ref 65–99)

## 2015-06-09 LAB — APTT: aPTT: 29 seconds (ref 24–37)

## 2015-06-09 MED ORDER — LORATADINE 10 MG PO TABS
10.0000 mg | ORAL_TABLET | Freq: Every day | ORAL | Status: DC
  Administered 2015-06-10: 10 mg via ORAL
  Filled 2015-06-09: qty 1

## 2015-06-09 MED ORDER — LORAZEPAM 1 MG PO TABS
1.0000 mg | ORAL_TABLET | Freq: Once | ORAL | Status: AC
  Administered 2015-06-09: 1 mg via ORAL
  Filled 2015-06-09: qty 1

## 2015-06-09 MED ORDER — ASPIRIN 300 MG RE SUPP: 300.0000 mg | Freq: Every day | RECTAL | Status: DC

## 2015-06-09 MED ORDER — ENOXAPARIN SODIUM 40 MG/0.4ML ~~LOC~~ SOLN
40.0000 mg | SUBCUTANEOUS | Status: DC
  Filled 2015-06-09: qty 0.4

## 2015-06-09 MED ORDER — SODIUM CHLORIDE 0.9 % IV SOLN: INTRAVENOUS | Status: DC

## 2015-06-09 MED ORDER — SENNOSIDES-DOCUSATE SODIUM 8.6-50 MG PO TABS: 1.0000 | ORAL_TABLET | Freq: Every evening | ORAL | Status: DC | PRN

## 2015-06-09 MED ORDER — MECLIZINE HCL 25 MG PO TABS
25.0000 mg | ORAL_TABLET | Freq: Once | ORAL | Status: AC
  Administered 2015-06-09: 25 mg via ORAL
  Filled 2015-06-09: qty 1

## 2015-06-09 MED ORDER — MECLIZINE HCL 12.5 MG PO TABS: 50.0000 mg | ORAL_TABLET | Freq: Three times a day (TID) | ORAL | Status: DC | PRN

## 2015-06-09 MED ORDER — STROKE: EARLY STAGES OF RECOVERY BOOK
Freq: Once | Status: DC
  Filled 2015-06-09: qty 1

## 2015-06-09 MED ORDER — TETRACAINE HCL 0.5 % OP SOLN
1.0000 [drp] | Freq: Once | OPHTHALMIC | Status: AC
  Administered 2015-06-09: 1 [drp] via OPHTHALMIC
  Filled 2015-06-09: qty 2

## 2015-06-09 MED ORDER — ASPIRIN 325 MG PO TABS
325.0000 mg | ORAL_TABLET | Freq: Every day | ORAL | Status: DC
  Administered 2015-06-10: 325 mg via ORAL
  Filled 2015-06-09: qty 1

## 2015-06-09 NOTE — Progress Notes (Signed)
Subjective:    Patient ID: Nancy [name], female    DOB: [birth year], 49 y.o.   MRN: [id number]  HPI   Pt in states she was walking today and had acute onset of double vision right  eye. Pt wears glasses. She adjusted her glasses. Pt states that these symptoms started 40 minutes ago. Pt states latter  pattern of elevator wall was changing.  Then pt got dizziness and felt nausea.   Her eye double vision resolved. But has some pressure around her eye.  Pt has no hx of migraines. Denies any hx of double vision. No light flashing. No spots in visual field.  LMP- 4 wks ago. Should start tomorrow per pt.   Review of Systems  Constitutional: Negative for fever, chills, diaphoresis, activity change and fatigue.  Eyes: Positive for visual disturbance.  Respiratory: Negative for cough, chest tightness and shortness of breath.   Cardiovascular: Negative for chest pain, palpitations and leg swelling.  Gastrointestinal: Negative for nausea, vomiting and abdominal pain.  Musculoskeletal: Negative for neck pain and neck stiffness.  Neurological: Positive for dizziness and light-headedness. Negative for tremors, seizures, syncope, facial asymmetry, speech difficulty, weakness, numbness and headaches.       Rt eye region pressure.  Psychiatric/Behavioral: Negative for behavioral problems, confusion and agitation. The patient is not nervous/anxious.     Past Medical History  Diagnosis Date  . Fibrocystic breast disease   . Atopic eczema   . Allergy     SEASONAL  . Anemia     HIGH SCHOOL  . GERD (gastroesophageal reflux disease)     Social History   Social History  . Marital Status: Married    Spouse Name: N/A  . Number of Children: 0  . Years of Education: 18   Occupational History  . [employer]  .     Social History Main Topics  . Smoking status: Never Smoker   . Smokeless tobacco: Never Used  . Alcohol Use: No  . Drug Use: No  . Sexual Activity:   Partners: Male   Other Topics Concern  . Not on file   Social History Narrative    Past Surgical History  Procedure Laterality Date  . Myomectomy  2002    Family History  Problem Relation Age of Onset  . Gout Mother   . Diabetes Mother   . Hypertension Father   . Diabetes Father   . Breast cancer Paternal Aunt   . Thyroid disease Neg Hx     Allergies  Allergen Reactions  . Penicillins Anaphylaxis    Current Outpatient Prescriptions on File Prior to Visit  Medication Sig Dispense Refill  . ALPRAZolam (XANAX) 0.5 MG tablet Take 0.5 mg by mouth as needed.    . cetirizine (ZYRTEC) 10 MG tablet Take 10 mg by mouth daily as needed. For allergies     . famotidine (PEPCID) 10 MG tablet Take 10 mg by mouth daily.    . meclizine (ANTIVERT) 50 MG tablet Take 1 tablet (50 mg total) by mouth 3 (three) times daily as needed. 30 tablet 0   No current facility-administered medications on file prior to visit.    BP 139/84 mmHg  Pulse 70  Ht '5\' 6"'$  (1.676 m)  Wt 158 lb 9.6 oz (71.94 kg)  BMI 25.61 kg/m2  SpO2 99%       Objective:   Physical Exam  General Mental Status- Alert. General Appearance- Not in acute distress.   Skin  General: Color- Normal Color. Moisture- Normal Moisture.  Neck Carotid Arteries- Normal color. Moisture- Normal Moisture. No carotid bruits. No JVD.  Chest and Lung Exam Auscultation: Breath Sounds:-Normal. CTA.  Cardiovascular Auscultation:Rythm- Regular,Rate and rythm Murmurs & Other Heart Sounds:Auscultation of the heart reveals- No Murmurs.  Abdomen Inspection:-Inspeection Normal. Palpation/Percussion:Note:No mass. Palpation and Percussion of the abdomen reveal- Non Tender, Non Distended + BS, no rebound or guarding.    Neurologic Cranial Nerve exam:- CN III-XII intact(No nystagmus), symmetric smile. Drift Test:- No drift. Romberg Exam:- Shaky on romberg. Heal to Toe Gait exam:- pt performed heel to toe gait poorly. Finger to  Nose:- Normal/Intact Strength:- 5/5 equal and symmetric strength both upper and lower extremities.      Assessment & Plan:  With your acute onset of visual change with eye region, temporal pressure, dizziness and nausea I do want you to be evaluated at the ED at [facility] now.   Your had poor heel to toe gait on exam  and poor romberg test on exam.  With you presentation you may need stat  imaging studies(ct and maybe mri), stat sed rate, or other test.   Please go to ED at [facility] now(no delay)

## 2015-06-09 NOTE — H&P (Signed)
[facility] History and Physical  ZANAIYA KERZNER UYQ:[id number] DOB: [birth year] DOA: 06/09/2015  Referring physician: Ms. Antony Madura. PCP: Loreen Freud, DO  Specialists: None.  Chief Complaint: Diplopia.  HPI: Nancy [name] is a 49 y.o. female history of vertigo and allergies presents to the year because of brief episode of diplopia with right retro-orbital pain. Patient's symptoms started around 2 PM and lasted up to 15 minutes following which symptoms resolved. Patient was seen things double. Denies any difficulty talking swallowing or any weakness of upper or lower extremities. Did not have any loss of consciousness. Diplopia completely resolved and the retro-orbital discomfort gradually improved. CT of the head did not show anything acute and ER physician had discussed with on-call neurologist Dr. Thad Ranger who advise patient to be admitted for possible TIA. On exam patient is presently asymptomatic and nonfocal.   Review of Systems: As presented in the history of presenting illness, rest negative.  Past Medical History  Diagnosis Date  . Fibrocystic breast disease   . Atopic eczema   . Allergy     SEASONAL  . Anemia     HIGH SCHOOL  . GERD (gastroesophageal reflux disease)    Past Surgical History  Procedure Laterality Date  . Myomectomy  2002   Social History:  reports that she has never smoked. She has never used smokeless tobacco. She reports that she does not drink alcohol or use illicit drugs. Where does patient live home. Can patient participate in ADLs? Yes.  Allergies  Allergen Reactions  . Penicillins Anaphylaxis    Has patient had a PCN reaction causing immediate rash, facial/tongue/throat swelling, SOB or lightheadedness with hypotensionYes Swelling in throat  Has patient had a PCN reaction causing severe rash involving mucus membranes or skin necrosis:/No Has patient had a PCN reaction that required hospitalization/No Has patient had a PCN  reaction occurring within the last 10 years:NO If all of the above answers are "NO", then may proceed with Cephalosporin use.     Family History:  Family History  Problem Relation Age of Onset  . Gout Mother   . Diabetes Mother   . Hypertension Father   . Diabetes Father   . Breast cancer Paternal Aunt   . Thyroid disease Neg Hx       Prior to Admission medications   Medication Sig Start Date End Date Taking? Authorizing Provider  cetirizine (ZYRTEC) 10 MG tablet Take 10 mg by mouth daily as needed. For allergies    Yes Historical Provider, MD  Cholecalciferol (VITAMIN D3) 5000 UNITS CAPS Take 1 capsule by mouth daily.   Yes Historical Provider, MD  meclizine (ANTIVERT) 50 MG tablet Take 1 tablet (50 mg total) by mouth 3 (three) times daily as needed. Patient taking differently: Take 50 mg by mouth 3 (three) times daily as needed for dizziness.  09/23/14  Yes Yvonne R Lowne, DO  VITAMIN K PO Take 1 capsule by mouth daily.   Yes Historical Provider, MD    Physical Exam: Filed Vitals:   06/09/15 2015 06/09/15 2030 06/09/15 2045 06/09/15 2145  BP: 134/95  140/77 132/76  Pulse: 67  67 69  Temp:  98.2 F (36.8 C)    TempSrc:      Resp: 19  14 13   Height:      Weight:      SpO2: 100%  100% 100%     General:  Moderately built and nourished.  Eyes: Anicteric no pallor.  ENT: No discharge from the  ears eyes nose and mouth.  Neck: No mass felt. No neck rigidity.  Cardiovascular: S1-S2 heard.  Respiratory: No rhonchi or crepitations.  Abdomen: Soft nontender bowel sounds present.  Skin: No rash.  Musculoskeletal: No edema.  Psychiatric: Appears normal.  Neurologic: Alert awake oriented to time place and person. Moves all extremities 5 x 5. No facial asymmetry. Tongue is midline.  Labs on Admission:  Basic Metabolic Panel:  Recent Labs Lab 06/09/15 1746 06/09/15 1753  NA 141 137  K 3.8 3.8  CL 103 104  CO2  --  21*  GLUCOSE 86 86  BUN 13 12  CREATININE  0.80 0.72  CALCIUM  --  9.2   Liver Function Tests:  Recent Labs Lab 06/09/15 1753  AST 21  ALT 16  ALKPHOS 37*  BILITOT 0.6  PROT 7.0  ALBUMIN 4.2   No results for input(s): LIPASE, AMYLASE in the last 168 hours. No results for input(s): AMMONIA in the last 168 hours. CBC:  Recent Labs Lab 06/09/15 1746 06/09/15 1753  WBC  --  6.0  NEUTROABS  --  1.9  HGB 14.6 13.8  HCT 43.0 41.1  MCV  --  86.0  PLT  --  227   Cardiac Enzymes: No results for input(s): CKTOTAL, CKMB, CKMBINDEX, TROPONINI in the last 168 hours.  BNP (last 3 results) No results for input(s): BNP in the last 8760 hours.  ProBNP (last 3 results) No results for input(s): PROBNP in the last 8760 hours.  CBG:  Recent Labs Lab 06/09/15 1721  GLUCAP 88    Radiological Exams on Admission: Ct Head Wo Contrast  06/09/2015   CLINICAL DATA:  Dizziness and double vision today. History of vertigo. No trauma.  EXAM: CT HEAD WITHOUT CONTRAST  TECHNIQUE: Contiguous axial images were obtained from the base of the skull through the vertex without intravenous contrast.  COMPARISON:  None.  FINDINGS: Ventricles are normal in size and configuration. There are no parenchymal masses or mass effect. There is no evidence of an infarct. There are no extra-axial masses or abnormal fluid collections.  There is no intracranial hemorrhage.  The visualized sinuses, mastoid air cells and middle ear cavities are clear. No skull lesion.  IMPRESSION: Normal unenhanced CT scan of the brain.   Electronically Signed   By: Amie Portland M.D.   On: 06/09/2015 18:27    EKG: Independently reviewed. Sinus arrhythmia.  Assessment/Plan Principal Problem:   Diplopia Active Problems:   Vertigo   1. Diplopia - concerning for TIA. Patient is presently asymptomatic and nonfocal. Patient has been placed on neuro check swallow evaluation MRI/MRA brain 2-D echo and carotid Dopplers have been ordered as recommended by neurologist.  Aspirin. 2. History of vertigo on when necessary meclizine. Patient states she gets vertigo on certain months and her present vertigo symptoms started last week it is mostly positional.   DVT Prophylaxis Lovenox.  Code Status: Full code.  Family Communication: Discussed with patient's husband.  Disposition Plan: Admit for observation.    Sheronica Corey N. [facility] Pager [phone].  If 7PM-7AM, please contact night-coverage [email or web address] Password [email or web address] 06/09/2015, 11:05 PM

## 2015-06-09 NOTE — ED Notes (Signed)
Pt from home with c/o double vision in right eye starting at 1415 this afternoon while walking lasting approx 15 minutes.  Pt reports dizziness starting at approx 3 pm today with no relief, sensitivity to light, and nausea.  Pt reports right eye pressure and the dizziness is "I'm moving, not the room."  Reports hx of vertigo but never has had double vision in the past. Pt in NAD, A&O.

## 2015-06-09 NOTE — Progress Notes (Signed)
Pre visit review using our clinic review tool, if applicable. No additional management support is needed unless otherwise documented below in the visit note. 

## 2015-06-09 NOTE — ED Provider Notes (Signed)
CSN: [id number]     Arrival date & time 06/09/15  1606 History   First MD Initiated Contact with Patient 06/09/15 2006     Chief Complaint  Patient presents with  . Diplopia  . Dizziness     (Consider location/radiation/quality/duration/timing/severity/associated sxs/prior Treatment) HPI Comments: Patient is a 49 year old female with a history of esophageal reflux and BPPV who presents to the emergency department for further evaluation of double vision and dizziness. Patient states that she was walking outside at 1415 when she began to see a change in the landscape. Patient looked at the nearest street sign and noticed that she was seeing double. She states that this lasted for approximately 15-20 minutes. Upon resolution of diplopia, patient noticed an acute pressure above her right eye, by her right ear, and her bilateral paranasal sinuses. This was associated with onset of dizziness which has persisted since onset and end positional in nature. Patient states that she has a history of ocular and sinus pressure associated with her vertigo. Symptoms associated with nausea as well as photophobia. She denies taking any medications for her symptoms. Patient has had no complete vision loss, fever, vomiting, neck stiffness or pain, extremity numbness or paresthesias, external ear weakness, tinnitus or hearing loss. She says she has history of vertigo mostly during seasonal changes. Patient denies history of CVA, hypertension, dyslipidemia, or diabetes mellitus. She has had no recent head injury or trauma.  Patient is a 49 y.o. female presenting with dizziness. The history is provided by the patient. No language interpreter was used.  Dizziness Associated symptoms: no chest pain, no hearing loss, no nausea, no shortness of breath, no tinnitus, no vomiting and no weakness     Past Medical History  Diagnosis Date  . Fibrocystic breast disease   . Atopic eczema   . Allergy     [facility]  . Anemia      [employer]  . GERD (gastroesophageal reflux disease)    Past Surgical History  Procedure Laterality Date  . Myomectomy  2002   Family History  Problem Relation Age of Onset  . Gout Mother   . Diabetes Mother   . Hypertension Father   . Diabetes Father   . Breast cancer Paternal Aunt   . Thyroid disease Neg Hx    Social History  Substance Use Topics  . Smoking status: Never Smoker   . Smokeless tobacco: Never Used  . Alcohol Use: No   OB History    No data available      Review of Systems  Constitutional: Negative for fever.  HENT: Positive for sinus pressure. Negative for hearing loss and tinnitus.   Eyes: Positive for visual disturbance.  Respiratory: Negative for shortness of breath.   Cardiovascular: Negative for chest pain.  Gastrointestinal: Negative for nausea and vomiting.  Neurological: Positive for dizziness. Negative for syncope, weakness and numbness.  All other systems reviewed and are negative.   Allergies  Penicillins  Home Medications   Prior to Admission medications   Medication Sig Start Date End Date Taking? Authorizing Provider  ALPRAZolam Duanne Moron) 0.5 MG tablet Take 0.5 mg by mouth as needed. 05/15/12   Rosalita Chessman, DO  cetirizine (ZYRTEC) 10 MG tablet Take 10 mg by mouth daily as needed. For allergies     Historical Provider, MD  famotidine (PEPCID) 10 MG tablet Take 10 mg by mouth daily.    Historical Provider, MD  meclizine (ANTIVERT) 50 MG tablet Take 1 tablet (50 mg total) by  mouth 3 (three) times daily as needed. 09/23/14   Alferd Apa Lowne, DO   BP 111/67 mmHg  Pulse 66  Temp(Src) 98 F (36.7 C) (Oral)  Resp 16  Ht '5\' 6"'$  (1.676 m)  Wt 158 lb (71.668 kg)  BMI 25.51 kg/m2  SpO2 100%  LMP 05/13/2015 (Exact Date)   Physical Exam  Constitutional: She is oriented to person, place, and time. She appears well-developed and well-nourished. No distress.  Nontoxic/nonseptic appearing  HENT:  Head: Normocephalic and atraumatic.   Mouth/Throat: Oropharynx is clear and moist. No oropharyngeal exudate.  Eyes: Conjunctivae and EOM are normal. Pupils are equal, round, and reactive to light. No scleral icterus.  EOMs normal. No nystagmus. Snellen 20/30 OS and OU; 20/25 OD. IOP 20 with 95% CI in the R eye. No proptosis or hyphema noted.  Neck: Normal range of motion.  Cardiovascular: Normal rate, regular rhythm and intact distal pulses.   Pulmonary/Chest: Effort normal. No respiratory distress. She has no wheezes.  Respirations even and unlabored  Musculoskeletal: Normal range of motion.  Neurological: She is alert and oriented to person, place, and time. No cranial nerve deficit. She exhibits normal muscle tone. Coordination normal.  GCS 15. Speech is goal oriented. No cranial nerve deficits appreciated; symmetric eyebrow raise, no facial drooping, tongue midline. Patient has equal grip strength bilaterally with 5/5 strength against resistance in all major muscle groups bilaterally. Sensation to light touch intact in all extremities. Patient moves extremities without ataxia. Normal finger-nose-finger. No pronator drift. Patient ambulatory with steady gait.  Skin: Skin is warm and dry. No rash noted. She is not diaphoretic. No erythema. No pallor.  Psychiatric: She has a normal mood and affect. Her behavior is normal.  Nursing note and vitals reviewed.   ED Course  Procedures (including critical care time) Labs Review Labs Reviewed  COMPREHENSIVE METABOLIC PANEL - Abnormal; Notable for the following:    CO2 21 (*)    Alkaline Phosphatase 37 (*)    All other components within normal limits  I-STAT CHEM 8, ED - Abnormal; Notable for the following:    Calcium, Ion 1.24 (*)    All other components within normal limits  PROTIME-INR  APTT  CBC  DIFFERENTIAL  I-STAT TROPOININ, ED  CBG MONITORING, ED    Imaging Review Ct Head Wo Contrast  06/09/2015   CLINICAL DATA:  Dizziness and double vision today. History of  vertigo. No trauma.  EXAM: CT HEAD WITHOUT CONTRAST  TECHNIQUE: Contiguous axial images were obtained from the base of the skull through the vertex without intravenous contrast.  COMPARISON:  None.  FINDINGS: Ventricles are normal in size and configuration. There are no parenchymal masses or mass effect. There is no evidence of an infarct. There are no extra-axial masses or abnormal fluid collections.  There is no intracranial hemorrhage.  The visualized sinuses, mastoid air cells and middle ear cavities are clear. No skull lesion.  IMPRESSION: Normal unenhanced CT scan of the brain.   Electronically Signed   By: Lajean Manes M.D.   On: 06/09/2015 18:27   I have personally reviewed and evaluated these images and lab results as part of my medical decision-making.   EKG Interpretation   Date/Time:  Monday June 09 2015 17:28:17 EDT Ventricular Rate:  70 PR Interval:  136 QRS Duration: 70 QT Interval:  380 QTC Calculation: 410 R Axis:   71 Text Interpretation:  Normal sinus rhythm with sinus arrhythmia Normal ECG  Confirmed by KNOTT MD, DANIEL [phone])  on 06/09/2015 9:39:53 PM      MDM   Final diagnoses:  Diplopia  Vertigo    49 year old female with a history of benign vertigo presents to the emergency department for evaluation of diplopia which was sudden in onset at 1415, lasting approximately 15-20 minutes. Resolution of diplopia was followed by onset of positional vertigo as well as photophobia, nausea, and pressure behind the right eye, in front of the right ear, and along the paranasal sinuses.  Patient's head CT is unremarkable today. Her laboratory workup is noncontributory. Visual acuity intact. Case discussed with Dr. Doy Mince of neurology who recommends inpatient TIA workup. TRH to admit.   Filed Vitals:   06/09/15 2000 06/09/15 2015 06/09/15 2030 06/09/15 2045  BP: 111/67 134/95  140/77  Pulse: 66 67  67  Temp:   98.2 F (36.8 C)   TempSrc:      Resp: '16 19  14   '$ Height:      Weight:      SpO2: 100% 100%  100%     Antonietta Breach, PA-C 06/09/15 2141  Leo Grosser, MD 06/09/15 2215

## 2015-06-09 NOTE — Patient Instructions (Signed)
With your acute onset of visual change with eye region, temporal pressure, dizziness and nausea I do want you to be evaluated at the ED at [facility] now.   Your had poor heel to toe gait on exam  and poor romberg test on exam.  With you presentation you may need stat  imaging studies(ct and maybe mri), stat sed rate, or other test.   Please go to ED at [facility] now(no delay).

## 2015-06-09 NOTE — ED Notes (Signed)
  CBG 88

## 2015-06-09 NOTE — ED Notes (Signed)
Consulted Dr Vanita Panda, follow stroke protocols without calling code stroke.

## 2015-06-10 ENCOUNTER — Observation Stay (HOSPITAL_BASED_OUTPATIENT_CLINIC_OR_DEPARTMENT_OTHER): Payer: Federal, State, Local not specified - PPO

## 2015-06-10 ENCOUNTER — Observation Stay (HOSPITAL_COMMUNITY): Payer: Federal, State, Local not specified - PPO

## 2015-06-10 ENCOUNTER — Ambulatory Visit (HOSPITAL_COMMUNITY): Admission: RE | Admit: 2015-06-10 | Payer: Federal, State, Local not specified - PPO | Source: Ambulatory Visit

## 2015-06-10 DIAGNOSIS — G459 Transient cerebral ischemic attack, unspecified: Secondary | ICD-10-CM

## 2015-06-10 DIAGNOSIS — H532 Diplopia: Secondary | ICD-10-CM

## 2015-06-10 LAB — LIPID PANEL
Cholesterol: 142 mg/dL (ref 0–200)
HDL: 52 mg/dL (ref >40)
LDL Cholesterol: 83 mg/dL (ref 0–99)
Total CHOL/HDL Ratio: 2.7 RATIO
Triglycerides: 33 mg/dL (ref <150)
VLDL: 7 mg/dL (ref 0–40)

## 2015-06-10 LAB — COMPREHENSIVE METABOLIC PANEL
ALT: 16 U/L (ref 14–54)
AST: 19 U/L (ref 15–41)
Albumin: 3.6 g/dL (ref 3.5–5.0)
Alkaline Phosphatase: 34 U/L — ABNORMAL LOW (ref 38–126)
Anion gap: 8 (ref 5–15)
BUN: 7 mg/dL (ref 6–20)
CO2: 23 mmol/L (ref 22–32)
Calcium: 8.4 mg/dL — ABNORMAL LOW (ref 8.9–10.3)
Chloride: 104 mmol/L (ref 101–111)
Creatinine, Ser: 0.68 mg/dL (ref 0.44–1.00)
GFR calc Af Amer: >60 mL/min (ref >60)
GFR calc non Af Amer: >60 mL/min (ref >60)
Glucose, Bld: 98 mg/dL (ref 65–99)
Potassium: 3.5 mmol/L (ref 3.5–5.1)
Sodium: 135 mmol/L (ref 135–145)
Total Bilirubin: 0.6 mg/dL (ref 0.3–1.2)
Total Protein: 6.2 g/dL — ABNORMAL LOW (ref 6.5–8.1)

## 2015-06-10 LAB — CBC
HCT: 38.7 % (ref 36.0–46.0)
Hemoglobin: 13 g/dL (ref 12.0–15.0)
MCH: 28.9 pg (ref 26.0–34.0)
MCHC: 33.6 g/dL (ref 30.0–36.0)
MCV: 86 fL (ref 78.0–100.0)
Platelets: 213 10*3/uL (ref 150–400)
RBC: 4.5 MIL/uL (ref 3.87–5.11)
RDW: 13 % (ref 11.5–15.5)
WBC: 4.9 10*3/uL (ref 4.0–10.5)

## 2015-06-10 LAB — TSH: TSH: 3.727 u[IU]/mL (ref 0.350–4.500)

## 2015-06-10 MED ORDER — ASPIRIN 325 MG PO TABS: 325.0000 mg | ORAL_TABLET | Freq: Every day | ORAL | Status: DC

## 2015-06-10 MED ORDER — ATORVASTATIN CALCIUM 10 MG PO TABS
10.0000 mg | ORAL_TABLET | Freq: Every day | ORAL | Status: DC
  Administered 2015-06-10: 10 mg via ORAL

## 2015-06-10 MED ORDER — LORAZEPAM 2 MG/ML IJ SOLN
0.5000 mg | Freq: Once | INTRAMUSCULAR | Status: DC
  Filled 2015-06-10: qty 1

## 2015-06-10 MED ORDER — ATORVASTATIN CALCIUM 10 MG PO TABS: 10.0000 mg | ORAL_TABLET | Freq: Every day | ORAL | Status: DC

## 2015-06-10 MED ORDER — ACETAMINOPHEN 325 MG PO TABS: 650.0000 mg | ORAL_TABLET | Freq: Four times a day (QID) | ORAL | Status: DC | PRN

## 2015-06-10 MED ORDER — ONDANSETRON HCL 4 MG/2ML IJ SOLN: 4.0000 mg | Freq: Four times a day (QID) | INTRAMUSCULAR | Status: DC | PRN

## 2015-06-10 NOTE — Progress Notes (Signed)
Occupational Therapy Evaluation Patient Details Name: Nancy [name] MRN: [id number] DOB: [birth year] Today's Date: 06/10/2015    History of Present Illness Pt is a 49 y/o female with PMH of vertigo and allergies presenting to the ED with episode of diplopia with right retro-orbital pain and diplopia.  CT of brain normal.   Clinical Impression   Pt admitted with the above diagnoses and presents with below problem list. Pt will benefit from continued acute OT to address the below listed deficits and maximize independence with BADLs prior to d/c home. PTA pt was independent with ADLs. Pt presents with some decreased dynamic standing balance and is currently min guard for LB ADLs. Session details below. OT to continue to follow acutely.      Follow Up Recommendations  No OT follow up;Supervision - Intermittent (OOB/mobility)    Equipment Recommendations  3 in 1 bedside comode    Recommendations for Other Services       Precautions / Restrictions Precautions Precautions: Fall Restrictions Weight Bearing Restrictions: No      Mobility Bed Mobility Overal bed mobility: Independent             General bed mobility comments: in recliner  Transfers Overall transfer level: Modified independent Equipment used: None             General transfer comment: pt with dizziness in standing.    Balance Overall balance assessment: Needs assistance Sitting-balance support: No upper extremity supported;Feet supported Sitting balance-Leahy Scale: Good     Standing balance support: No upper extremity supported;During functional activity Standing balance-Leahy Scale: Good Standing balance comment: balance decreases with head turns. Pt noted to maintain a forward straight line gaze. Unsteadiness noted retrieving items from ground.             High level balance activites: Direction changes;Turns;Sudden stops;Head turns High Level Balance Comments: Pt is able to perform  high level balance activities without LOB though she does experience some dizziness which resolves quickly when activity stopped.            ADL Overall ADL's : Needs assistance/impaired Eating/Feeding: Set up;Sitting   Grooming: Supervision/safety;Standing;Oral care   Upper Body Bathing: Supervision/ safety;Sitting   Lower Body Bathing: Min guard;Sit to/from stand   Upper Body Dressing : Supervision/safety;Sitting   Lower Body Dressing: Min guard;Sit to/from stand   Toilet Transfer: Min guard;Ambulation;Regular Museum/gallery exhibitions officer and Hygiene: Min guard;Sit to/from stand   Tub/ Shower Transfer: Min guard;Walk-in shower;Ambulation;3 in 1   Functional mobility during ADLs: Min guard General ADL Comments: Pt presents with some impaired dynamic standing balance. Min guard for ADLs requiring dynamic standing balance. Pt reports dizziness worsens with head movement. Discussed impact of decreased head turns on functional ability to drive. Edcuated on fall prevention and use of recommended 3n1 as shower seat.      Vision Additional Comments: Pt reports she did have some light sensitivity when this started. Increased dizziness and frontal headache/pressure with diagonal eye movements. Denies diplopia and blurriness when reading therapist name badge up close and clock at a distance on wall.    Perception     Praxis      Pertinent Vitals/Pain Pain Assessment: Faces Faces Pain Scale: Hurts little more Pain Location: frontal headache/pressure Pain Descriptors / Indicators: Pressure Pain Intervention(s): Limited activity within patient's tolerance;Monitored during session;Repositioned     Hand Dominance Right   Extremity/Trunk Assessment Upper Extremity Assessment Upper Extremity Assessment: Overall WFL for tasks assessed   Lower  Extremity Assessment Lower Extremity Assessment: Defer to PT evaluation   Cervical / Trunk Assessment Cervical / Trunk  Assessment: Normal   Communication Communication Communication: No difficulties   Cognition Arousal/Alertness: Awake/alert Behavior During Therapy: WFL for tasks assessed/performed Overall Cognitive Status: Within Functional Limits for tasks assessed                     General Comments    Pt reports headache/pressure less at end of session.    Exercises       Shoulder Instructions      Home Living Family/patient expects to be discharged to:: Private residence Living Arrangements: Spouse/significant other Available Help at Discharge: Family;Available PRN/intermittently Type of Home: House Home Access: Level entry     Home Layout: Two level Alternate Level Stairs-Number of Steps: 16 Alternate Level Stairs-Rails: Can reach both Bathroom Shower/Tub: Tub/shower unit;Walk-in shower   Bathroom Toilet: Standard     Home Equipment: None          Prior Functioning/Environment Level of Independence: Independent        Comments: works in an office setting; drives    OT Diagnosis: Other (comment) (impaired dynamic standing balance)   OT Problem List: Impaired balance (sitting and/or standing);Decreased knowledge of use of DME or AE;Decreased knowledge of precautions   OT Treatment/Interventions: Self-care/ADL training;DME and/or AE instruction;Therapeutic activities;Patient/family education;Balance training    OT Goals(Current goals can be found in the care plan section) Acute Rehab OT Goals OT Goal Formulation: With patient Time For Goal Achievement: 06/17/15 Potential to Achieve Goals: Good ADL Goals Pt Will Perform Lower Body Bathing: with modified independence;sit to/from stand Pt Will Perform Lower Body Dressing: with modified independence;sit to/from stand Pt Will Perform Tub/Shower Transfer: Shower transfer;3 in 1;ambulating;with modified independence  OT Frequency: Min 2X/week   Barriers to D/C:            Co-evaluation              End  of Session Equipment Utilized During Treatment: Gait belt  Activity Tolerance: Patient tolerated treatment well Patient left: in chair;with call bell/phone within reach;with chair alarm set   Time: 8099-8338 OT Time Calculation (min): 23 min Charges:  OT General Charges $OT Visit: 1 Procedure OT Evaluation $Initial OT Evaluation Tier I: 1 Procedure OT Treatments $Self Care/Home Management : 8-22 mins G-Codes: OT G-codes **NOT FOR INPATIENT CLASS** Functional Assessment Tool Used: clinical judgement Functional Limitation: Self care Self Care Current Status (S5053): At least 1 percent but less than 20 percent impaired, limited or restricted Self Care Goal Status (Z7673): At least 1 percent but less than 20 percent impaired, limited or restricted  Hortencia Pilar 06/10/2015, 11:14 AM

## 2015-06-10 NOTE — Progress Notes (Signed)
*  PRELIMINARY RESULTS* Vascular Ultrasound Carotid Duplex (Doppler) has been completed.   Findings suggest 1-39% internal carotid artery stenosis bilaterally. Vertebral arteries are patent with antegrade flow.  Incidental finding: There is atypical flow adjacent to the left internal jugular vein and left internal carotid artery. This is suggestive of a possible arteriovenous fistula, versus unknown etiology.   06/10/2015 10:51 AM Maudry Mayhew, RVT, RDCS, RDMS

## 2015-06-10 NOTE — Progress Notes (Signed)
Echocardiogram 2D Echocardiogram has been performed.  Nancy [name] 06/10/2015, 10:33 AM

## 2015-06-10 NOTE — Care Management Note (Signed)
Case Management Note  Patient Details  Name: [name] MRN: [id number] Date of Birth: 03-May-1966  Subjective/Objective:                    Action/Plan: Patient admitted with TIA. Patient lives at home with her husband. Plan is to discharge today. OT recommending a 3 in 1. CM spoke with the patient and she would rather have a tub bench. CM offered to order her a tub bench but she decided she would rather obtain one at an outpatient setting at a cheaper cost. Will notify bedside RN.   Expected Discharge Date:                  Expected Discharge Plan:  Home/Self Care  In-House Referral:     Discharge planning Services     Post Acute Care Choice:    Choice offered to:     DME Arranged:    DME Agency:     HH Arranged:    HH Agency:     Status of Service:  In process, will continue to follow  Medicare Important Message Given:    Date Medicare IM Given:    Medicare IM give by:    Date Additional Medicare IM Given:    Additional Medicare Important Message give by:     If discussed at [place] of Stay Meetings, dates discussed:    Additional Comments:  Pollie Friar, RN 06/10/2015, 11:12 AM

## 2015-06-10 NOTE — Progress Notes (Signed)
Patient is discharged from room 5M11 at this time. Alert and in stable condition. IV site d/c'd as well as tele. Instructions given to patient and understanding verbalized. Left unit via wheelchair with husband and all belongings at side.

## 2015-06-10 NOTE — Progress Notes (Signed)
Physician Discharge Summary  Nancy [name] MGQ:[id number] DOB: 1966-03-11 DOA: 06/09/2015  PCP: Garnet Koyanagi, DO  Admit date: 06/09/2015 Discharge date: 06/10/2015  Time spent: 35 minutes  Recommendations for Outpatient Follow-up:  1. FU with PCP Regarding the following:  1. Hyperlipidemia: LDL in hospital was 83. Post TA goal < 70. Initiated statin therapy. Please repeat lipid panel in 3 months.  2. HgbA1c ordered in hospital, results pending. Please FU when results are confirmed and manage as appropriate.   Discharge Diagnoses:  Principal Problem:   Diplopia Active Problems:   Vertigo   Discharge Condition: Stable for Discharge   Diet recommendation: None   Filed Weights   06/09/15 1658 06/09/15 2322  Weight: 71.668 kg (158 lb) 71.487 kg (157 lb 9.6 oz)    History of present illness:  Nancy [name] is a 49 yo female who presented to the ED on 06/09/15 following a brief episode of diplopia with right retro-orbital pain. Symptoms began at 2 PM on 10/10 and lasted 15 minutes with spontaneous resolution. She denied any LOC or other focal neurological deficits. In the ED, CT of the head showed no acute abnormalities. Patient was admitted for further workup of possible TIA. PMH includes vertigo and seasonal allergies.   Hospital Course:  Principal Problem: TIA: Patient presented with a history of sudden onset diplopia x15 minutes that spontaneously resolved prior to admission.No focal findings on exam.CT, MRI, and MRA showed no acute abnormalities. Echocardiogram showed EF of 26-71% with no embolic source. Carotid doppler showed 1-39% stenosis bilaterally with an incidental finding of atypical flow to internal jugular vein and internal carotid- questionable fistula. CTA Neck was ordered after discussion with Dr Janann Colonel who thought that this was unlikely to be related to her presentation. However patient refused and wanted it done as an outpatient.  LDL 83 (see below) and  HgbA1c pending. Aspirin initiated on admission. Patient will need to continue aspirin 325 mg QD on discharge.   Active Problems:  Hyperlipidemia: Current LDL 83. Post TIA goal < 70. Initiated statin therapy, continue on discharge.   Vertigo: Chronic history of positional vertigo, managed at home with Meclizine PRN. Continue supportive therapy and monitor.   Procedures:  Echocardiogram   Carotid Doppler  Consultations:  None  Discharge Exam: Filed Vitals:   06/10/15 1330  BP: 107/71  Pulse: 77  Temp: 98.4 F (36.9 C)  Resp:     General:  Well-appearing woman, laying in bed with no acute distress  Cardiovascular: RRR, no m/r/g. No peripheral edema   Respiratory: CTA b/l, no wheeze or crackles   Abdomen: Soft, non-tender, non-distended  Neuro: A&Ox3, no focal neurological deficits  Skin: No rashes, lesions, or wounds  Discharge Instructions  Discharge Instructions    Call MD for:  extreme fatigue    Complete by:  As directed      Call MD for:  persistant dizziness or light-headedness    Complete by:  As directed      Diet - low sodium heart healthy    Complete by:  As directed      Increase activity slowly    Complete by:  As directed           Current Discharge Medication List    START taking these medications   Details  aspirin 325 MG tablet Take 1 tablet (325 mg total) by mouth daily. Qty: 30 tablet, Refills: 0    atorvastatin (LIPITOR) 10 MG tablet Take 1 tablet (10 mg total)  by mouth daily at 6 PM. Qty: 30 tablet, Refills: 0      CONTINUE these medications which have NOT CHANGED   Details  cetirizine (ZYRTEC) 10 MG tablet Take 10 mg by mouth daily as needed. For allergies     Cholecalciferol (VITAMIN D3) 5000 UNITS CAPS Take 1 capsule by mouth daily.    meclizine (ANTIVERT) 50 MG tablet Take 1 tablet (50 mg total) by mouth 3 (three) times daily as needed. Qty: 30 tablet, Refills: 0   Associated Diagnoses: Benign paroxysmal positional vertigo,  bilateral    VITAMIN K PO Take 1 capsule by mouth daily.       Allergies  Allergen Reactions  . Penicillins Anaphylaxis    Has patient had a PCN reaction causing immediate rash, facial/tongue/throat swelling, SOB or lightheadedness with hypotensionYes Swelling in throat  Has patient had a PCN reaction causing severe rash involving mucus membranes or skin necrosis:/No Has patient had a PCN reaction that required hospitalization/No Has patient had a PCN reaction occurring within the last 10 years:NO If all of the above answers are "NO", then may proceed with Cephalosporin use.    Follow-up Information    Follow up with Garnet Koyanagi, DO. Schedule an appointment as soon as possible for a visit in 2 weeks.   Specialty:  Family Medicine   Contact information:   [address] [place] [address] [phone]        The results of significant diagnostics from this hospitalization (including imaging, microbiology, ancillary and laboratory) are listed below for reference.    Significant Diagnostic Studies: Ct Head Wo Contrast  06/09/2015   CLINICAL DATA:  Dizziness and double vision today. History of vertigo. No trauma.  EXAM: CT HEAD WITHOUT CONTRAST  TECHNIQUE: Contiguous axial images were obtained from the base of the skull through the vertex without intravenous contrast.  COMPARISON:  None.  FINDINGS: Ventricles are normal in size and configuration. There are no parenchymal masses or mass effect. There is no evidence of an infarct. There are no extra-axial masses or abnormal fluid collections.  There is no intracranial hemorrhage.  The visualized sinuses, mastoid air cells and middle ear cavities are clear. No skull lesion.  IMPRESSION: Normal unenhanced CT scan of the brain.   Electronically Signed   By: Lajean Manes M.D.   On: 06/09/2015 18:27   Mr Jodene Nam Head Wo Contrast  06/10/2015   CLINICAL DATA:  Initial evaluation for acute one-day history of double vision in  right eye. Dizziness.  EXAM: MRI HEAD WITHOUT CONTRAST  MRA HEAD WITHOUT CONTRAST  TECHNIQUE: Multiplanar, multiecho pulse sequences of the brain and surrounding structures were obtained without intravenous contrast. Angiographic images of the head were obtained using MRA technique without contrast.  COMPARISON:  Prior CT from 06/09/2015.  FINDINGS: MRI HEAD FINDINGS  The CSF containing spaces are within normal limits for patient age. Few tiny subcentimeter T2/FLAIR hyperintense foci noted within the periventricular white matter, nonspecific, but felt to be within normal limits for patient age. No mass lesion, midline shift, or extra-axial fluid collection. Ventricles are normal in size without evidence of hydrocephalus.  No diffusion-weighted signal abnormality is identified to suggest acute intracranial infarct. Gray-white matter differentiation is maintained. Normal flow voids are seen within the intracranial vasculature. No intracranial hemorrhage identified.  The cervicomedullary junction is normal. Pituitary gland is within normal limits. Pituitary stalk is midline. The globes and optic nerves demonstrate a normal appearance with normal signal intensity.  The  bone marrow signal intensity is normal. Calvarium is intact. Visualized upper cervical spine is within normal limits.  Scalp soft tissues are unremarkable.  Paranasal sinuses are clear.  No mastoid effusion.  MRA HEAD FINDINGS  ANTERIOR CIRCULATION:  Visualized distal cervical segments of the internal carotid arteries are widely patent with antegrade flow. The petrous, cavernous, and supraclinoid segments are widely patent bilaterally. A1 segments, anterior communicating artery, and anterior cerebral arteries widely patent. M1 segments patent without stenosis or occlusion. Tiny focal outpouching arising from the mid aspect of the left M1 segment favored to reflect a normal infundibulum. MCA bifurcations within normal limits. Distal MCA branches well  opacified and symmetric.  POSTERIOR CIRCULATION:  Vertebral arteries are codominant and widely patent to the vertebrobasilar junction. Posterior inferior cerebellar arteries patent bilaterally. Basilar artery widely patent. Superior cerebellar and posterior cerebral arteries well opacified bilaterally.  No aneurysm or vascular malformation.  IMPRESSION: MRI HEAD IMPRESSION:  Normal brain MRI with no acute intracranial infarct or other process identified.  MRA HEAD IMPRESSION:  Normal MRA of the intracranial circulation.   Electronically Signed   By: Jeannine Boga M.D.   On: 06/10/2015 05:03   Mr Brain Wo Contrast  06/10/2015   CLINICAL DATA:  Initial evaluation for acute one-day history of double vision in right eye. Dizziness.  EXAM: MRI HEAD WITHOUT CONTRAST  MRA HEAD WITHOUT CONTRAST  TECHNIQUE: Multiplanar, multiecho pulse sequences of the brain and surrounding structures were obtained without intravenous contrast. Angiographic images of the head were obtained using MRA technique without contrast.  COMPARISON:  Prior CT from 06/09/2015.  FINDINGS: MRI HEAD FINDINGS  The CSF containing spaces are within normal limits for patient age. Few tiny subcentimeter T2/FLAIR hyperintense foci noted within the periventricular white matter, nonspecific, but felt to be within normal limits for patient age. No mass lesion, midline shift, or extra-axial fluid collection. Ventricles are normal in size without evidence of hydrocephalus.  No diffusion-weighted signal abnormality is identified to suggest acute intracranial infarct. Gray-white matter differentiation is maintained. Normal flow voids are seen within the intracranial vasculature. No intracranial hemorrhage identified.  The cervicomedullary junction is normal. Pituitary gland is within normal limits. Pituitary stalk is midline. The globes and optic nerves demonstrate a normal appearance with normal signal intensity.  The bone marrow signal intensity is  normal. Calvarium is intact. Visualized upper cervical spine is within normal limits.  Scalp soft tissues are unremarkable.  Paranasal sinuses are clear.  No mastoid effusion.  MRA HEAD FINDINGS  ANTERIOR CIRCULATION:  Visualized distal cervical segments of the internal carotid arteries are widely patent with antegrade flow. The petrous, cavernous, and supraclinoid segments are widely patent bilaterally. A1 segments, anterior communicating artery, and anterior cerebral arteries widely patent. M1 segments patent without stenosis or occlusion. Tiny focal outpouching arising from the mid aspect of the left M1 segment favored to reflect a normal infundibulum. MCA bifurcations within normal limits. Distal MCA branches well opacified and symmetric.  POSTERIOR CIRCULATION:  Vertebral arteries are codominant and widely patent to the vertebrobasilar junction. Posterior inferior cerebellar arteries patent bilaterally. Basilar artery widely patent. Superior cerebellar and posterior cerebral arteries well opacified bilaterally.  No aneurysm or vascular malformation.  IMPRESSION: MRI HEAD IMPRESSION:  Normal brain MRI with no acute intracranial infarct or other process identified.  MRA HEAD IMPRESSION:  Normal MRA of the intracranial circulation.   Electronically Signed   By: Jeannine Boga M.D.   On: 06/10/2015 05:03    Microbiology: No results found for  this or any previous visit (from the past 240 hour(s)).   Labs: Basic Metabolic Panel:  Recent Labs Lab 06/09/15 1746 06/09/15 1753 06/10/15 0517  NA 141 137 135  K 3.8 3.8 3.5  CL 103 104 104  CO2  --  21* 23  GLUCOSE 86 86 98  BUN '13 12 7  '$ CREATININE 0.80 0.72 0.68  CALCIUM  --  9.2 8.4*   Liver Function Tests:  Recent Labs Lab 06/09/15 1753 06/10/15 0517  AST 21 19  ALT 16 16  ALKPHOS 37* 34*  BILITOT 0.6 0.6  PROT 7.0 6.2*  ALBUMIN 4.2 3.6   No results for input(s): LIPASE, AMYLASE in the last 168 hours. No results for input(s):  AMMONIA in the last 168 hours. CBC:  Recent Labs Lab 06/09/15 1746 06/09/15 1753 06/10/15 0517  WBC  --  6.0 4.9  NEUTROABS  --  1.9  --   HGB 14.6 13.8 13.0  HCT 43.0 41.1 38.7  MCV  --  86.0 86.0  PLT  --  227 213   Cardiac Enzymes: No results for input(s): CKTOTAL, CKMB, CKMBINDEX, TROPONINI in the last 168 hours. BNP: BNP (last 3 results) No results for input(s): BNP in the last 8760 hours.  ProBNP (last 3 results) No results for input(s): PROBNP in the last 8760 hours.  CBG:  Recent Labs Lab 06/09/15 1721  GLUCAP 88    Signed: Micayla Zeltman PA-C  [facility] 06/10/2015, 1:56 PM  Attending MD note  Patient was seen, examined,treatment plan was discussed with the PA.  I have personally reviewed the clinical findings, lab, imaging studies and management of this patient in detail. I agree with the documentation, as recorded by the PA-S   Admitted with transient Diplopia-resolved. No focal findings on exam. MRI Brain neg for CVA, MRA Brain negative for significant stenosis. Echo shows preserved EF-no embolic source. Carotid Doppler neg-but showed ?AV Fistula on the left. CTA neck planned-but patient refused. Case discussed with Dr Janann Colonel who felt that doppler findings were likely unrelated to patients clinical presentation. Being discharged home in a stable manner-instructed to follow with her PCP    [facility]

## 2015-06-10 NOTE — Progress Notes (Signed)
Patient declined having ordered CTA in the hospital. Would prefer to have it done outpatient. MD made aware.

## 2015-06-10 NOTE — Evaluation (Signed)
Physical Therapy Evaluation & Discharge Patient Details Name: Nancy [name] MRN: [id number] DOB: 1966-01-17 Today's Date: 06/10/2015   History of Present Illness  Pt is a 49 y/o female with PMH of vertigo and allergies presenting to the ED with episode of diplopia with right retro-orbital pain and diplopia.  CT of brain normal.  Clinical Impression  Pt was evaluated for acute PT services and demonstrated ability to perform functional mobility with independence.  Pt educated on safety with functional activity at home.  No further acute PT needed at this time with possible outpatient vestibular evaluation if symptoms continue following d/c home.    Follow Up Recommendations Supervision for mobility/OOB;Other (comment) (outpatient f/u for vestibular eval if symptoms continue)    Equipment Recommendations  None recommended by PT    Recommendations for Other Services OT consult     Precautions / Restrictions Precautions Precautions: Fall Restrictions Weight Bearing Restrictions: No      Mobility  Bed Mobility Overal bed mobility: Independent                Transfers Overall transfer level: Independent Equipment used: None             General transfer comment: mild dizziness noted upon standing, resolved quickly  Ambulation/Gait Ambulation/Gait assistance: Independent Ambulation Distance (Feet): 200 Feet Assistive device: None Gait Pattern/deviations: Step-through pattern;WFL(Within Functional Limits) Gait velocity: slow; able to increase cadence some with cueing Gait velocity interpretation: Below normal speed for age/gender General Gait Details: Pt able to walk while looking straight ahead independently without symptoms.  With head turns and turns, however, she notes dizziness during which she must slow down to "reset" and the dizziness resolves quickly. Pt educated on importance of recognizing these symptoms and stopping as needed to prevent  falls.  Stairs Stairs: Yes Stairs assistance: Supervision Stair Management: Two rails Number of Stairs: 12 General stair comments: Pt able to ascend/descend stairs with PT supervision for safety.  Pt notes that if she maintains her gaze straight ahead she has no problems but that if she tries to look down the stairs she gets dizzy.  When PT cued pt to make sure to look down to ensure she knew when the stairs ended, pt said she counts the stairs before going down so that she doesn't have to look down.  Pt educated on safety while using stairs to prevent dizziness and falls.  Wheelchair Mobility    Modified Rankin (Stroke Patients Only)       Balance Overall balance assessment: Needs assistance Sitting-balance support: Feet supported;No upper extremity supported Sitting balance-Leahy Scale: Good     Standing balance support: No upper extremity supported;During functional activity Standing balance-Leahy Scale: Good               High level balance activites: Direction changes;Turns;Sudden stops;Head turns High Level Balance Comments: Pt is able to perform high level balance activities without LOB though she does experience some dizziness which resolves quickly when activity stopped.             Pertinent Vitals/Pain Pain Assessment: Faces Faces Pain Scale: Hurts a little bit Pain Location: R orbital pain Pain Descriptors / Indicators: Grimacing Pain Intervention(s): Monitored during session    Home Living Family/patient expects to be discharged to:: Private residence Living Arrangements: Spouse/significant other Available Help at Discharge: Family;Available PRN/intermittently Type of Home: House Home Access: Level entry     Home Layout: Two level Home Equipment: None      Prior Function Level  of Independence: Independent               Hand Dominance        Extremity/Trunk Assessment   Upper Extremity Assessment: Overall WFL for tasks assessed            Lower Extremity Assessment: Overall WFL for tasks assessed         Communication   Communication: No difficulties  Cognition Arousal/Alertness: Awake/alert Behavior During Therapy: WFL for tasks assessed/performed Overall Cognitive Status: Within Functional Limits for tasks assessed                      General Comments General comments (skin integrity, edema, etc.): Pt notes she is not experience diplopia anymore and that she continues to have very mild dizziness but that it is much better than when she was admitted.    Exercises        Assessment/Plan    PT Assessment Patent does not need any further PT services  PT Diagnosis Difficulty walking;Other (comment) (vestibular symptoms)   PT Problem List    PT Treatment Interventions     PT Goals (Current goals can be found in the Care Plan section) Acute Rehab PT Goals PT Goal Formulation: All assessment and education complete, DC therapy    Frequency     Barriers to discharge        Co-evaluation               End of Session Equipment Utilized During Treatment: Gait belt Activity Tolerance: Patient tolerated treatment well Patient left: Other (comment) (in restroom with transport waiting to take her to a test) Nurse Communication: Mobility status    Functional Assessment Tool Used: clinical judgement Functional Limitation: Mobility: Walking and moving around Mobility: Walking and Moving Around Current Status [phone]): 0 percent impaired, limited or restricted Mobility: Walking and Moving Around Goal Status [phone]): 0 percent impaired, limited or restricted Mobility: Walking and Moving Around Discharge Status [phone]): 0 percent impaired, limited or restricted    Time: 6226-3335 PT Time Calculation (min) (ACUTE ONLY): 10 min   Charges:   PT Evaluation $Initial PT Evaluation Tier I: 1 Procedure     PT G Codes:   PT G-Codes **NOT FOR INPATIENT CLASS** Functional Assessment Tool Used:  clinical judgement Functional Limitation: Mobility: Walking and moving around Mobility: Walking and Moving Around Current Status (K5625): 0 percent impaired, limited or restricted Mobility: Walking and Moving Around Goal Status (W3893): 0 percent impaired, limited or restricted Mobility: Walking and Moving Around Discharge Status [phone]): 0 percent impaired, limited or restricted    Lorita Officer 06/10/2015, 9:37 AM  Lorita Officer, SPT

## 2015-06-11 LAB — HEMOGLOBIN A1C
Hgb A1c MFr Bld: 5.7 % — ABNORMAL HIGH (ref 4.8–5.6)
Mean Plasma Glucose: 117 mg/dL

## 2015-06-12 ENCOUNTER — Ambulatory Visit (INDEPENDENT_AMBULATORY_CARE_PROVIDER_SITE_OTHER): Payer: Federal, State, Local not specified - PPO | Admitting: Family Medicine

## 2015-06-12 ENCOUNTER — Encounter: Payer: Self-pay | Admitting: Family Medicine

## 2015-06-12 VITALS — BP 132/77 | HR 68 | Temp 98.3°F | Wt 155.0 lb

## 2015-06-12 DIAGNOSIS — E785 Hyperlipidemia, unspecified: Secondary | ICD-10-CM | POA: Diagnosis not present

## 2015-06-12 DIAGNOSIS — H532 Diplopia: Secondary | ICD-10-CM

## 2015-06-12 DIAGNOSIS — G458 Other transient cerebral ischemic attacks and related syndromes: Secondary | ICD-10-CM

## 2015-06-12 DIAGNOSIS — G459 Transient cerebral ischemic attack, unspecified: Secondary | ICD-10-CM | POA: Insufficient documentation

## 2015-06-12 DIAGNOSIS — R42 Dizziness and giddiness: Secondary | ICD-10-CM | POA: Diagnosis not present

## 2015-06-12 NOTE — Assessment & Plan Note (Signed)
Start lipitor Recheck labs 3 months Lab Results  Component Value Date   CHOL 142 06/10/2015   HDL 52 06/10/2015   LDLCALC 83 06/10/2015   TRIG 33 06/10/2015   CHOLHDL 2.7 06/10/2015

## 2015-06-12 NOTE — Assessment & Plan Note (Signed)
Resolved

## 2015-06-12 NOTE — Discharge Summary (Signed)
Nancy [name] [id number] DOB: [birth year] DOA: 06/09/2015  PCP: Garnet Koyanagi, DO  Admit date: 06/09/2015 Discharge date: 06/10/2015  Time spent: 35 minutes  Recommendations for Outpatient Follow-up:  1. FU with PCP Regarding the following:  1. Hyperlipidemia: LDL in hospital was 83. Post TA goal < 70. Initiated statin therapy. Please repeat lipid panel in 3 months.  2. HgbA1c ordered in hospital, results pending. Please FU when results are confirmed and manage as appropriate.  Discharge Diagnoses:  Principal Problem:  Diplopia Active Problems:  Vertigo   Discharge Condition: Stable for Discharge   Diet recommendation: None   Filed Weights   06/09/15 1658 06/09/15 2322  Weight: 71.668 kg (158 lb) 71.487 kg (157 lb 9.6 oz)    History of present illness:  Nancy [name] is a 49 yo female who presented to the ED on 06/09/15 following a brief episode of diplopia with right retro-orbital pain. Symptoms began at 2 PM on 10/10 and lasted 15 minutes with spontaneous resolution. She denied any LOC or other focal neurological deficits. In the ED, CT of the head showed no acute abnormalities. Patient was admitted for further workup of possible TIA. PMH includes vertigo and seasonal allergies.   Hospital Course:  Principal Problem: TIA: Patient presented with a history of sudden onset diplopia x15 minutes that spontaneously resolved prior to admission.No focal findings on exam.CT, MRI, and MRA showed no acute abnormalities. Echocardiogram showed EF of 73-41% with no embolic source. Carotid doppler showed 1-39% stenosis bilaterally with an incidental finding of atypical flow to internal jugular vein and internal carotid- questionable fistula. CTA Neck was ordered after discussion with Dr Janann Colonel who thought that this was unlikely to be related to her presentation. However patient refused and wanted it done as an outpatient. LDL 83 (see below) and HgbA1c pending.  Aspirin initiated on admission. Patient will need to continue aspirin 325 mg QD on discharge.   Active Problems:  Hyperlipidemia: Current LDL 83. Post TIA goal < 70. Initiated statin therapy, continue on discharge.   Vertigo: Chronic history of positional vertigo, managed at home with Meclizine PRN. Continue supportive therapy and monitor.   Procedures:  Echocardiogram   Carotid Doppler  Consultations:  None  Discharge Exam: Filed Vitals:   06/10/15 1330  BP: 107/71  Pulse: 77  Temp: 98.4 F (36.9 C)  Resp:     General: Well-appearing woman, laying in bed with no acute distress  Cardiovascular: RRR, no m/r/g. No peripheral edema   Respiratory: CTA b/l, no wheeze or crackles   Abdomen: Soft, non-tender, non-distended  Neuro: A&Ox3, no focal neurological deficits  Skin: No rashes, lesions, or wounds  Discharge Instructions  Discharge Instructions    Call MD for: extreme fatigue  Complete by: As directed      Call MD for: persistant dizziness or light-headedness  Complete by: As directed      Diet - low sodium heart healthy  Complete by: As directed      Increase activity slowly  Complete by: As directed           Current Discharge Medication List    START taking these medications   Details  aspirin 325 MG tablet Take 1 tablet (325 mg total) by mouth daily. Qty: 30 tablet, Refills: 0    atorvastatin (LIPITOR) 10 MG tablet Take 1 tablet (10 mg total) by mouth daily at 6 PM. Qty: 30 tablet, Refills: 0      CONTINUE these medications which have NOT CHANGED  Details  cetirizine (ZYRTEC) 10 MG tablet Take 10 mg by mouth daily as needed. For allergies     Cholecalciferol (VITAMIN D3) 5000 UNITS CAPS Take 1 capsule by mouth daily.    meclizine (ANTIVERT) 50 MG tablet Take 1 tablet (50 mg total) by mouth 3 (three) times daily as needed. Qty: 30 tablet, Refills: 0   Associated  Diagnoses: Benign paroxysmal positional vertigo, bilateral    VITAMIN K PO Take 1 capsule by mouth daily.       Allergies  Allergen Reactions  . Penicillins Anaphylaxis    Has patient had a PCN reaction causing immediate rash, facial/tongue/throat swelling, SOB or lightheadedness with hypotensionYes Swelling in throat  Has patient had a PCN reaction causing severe rash involving mucus membranes or skin necrosis:/No Has patient had a PCN reaction that required hospitalization/No Has patient had a PCN reaction occurring within the last 10 years:NO If all of the above answers are "NO", then may proceed with Cephalosporin use.    Follow-up Information    Follow up with Garnet Koyanagi, DO. Schedule an appointment as soon as possible for a visit in 2 weeks.   Specialty: Family Medicine   Contact information:   [address] [place] [address] [phone]         The results of significant diagnostics from this hospitalization (including imaging, microbiology, ancillary and laboratory) are listed below for reference.    Significant Diagnostic Studies:  Imaging Results    Ct Head Wo Contrast  06/09/2015 CLINICAL DATA: Dizziness and double vision today. History of vertigo. No trauma. EXAM: CT HEAD WITHOUT CONTRAST TECHNIQUE: Contiguous axial images were obtained from the base of the skull through the vertex without intravenous contrast. COMPARISON: None. FINDINGS: Ventricles are normal in size and configuration. There are no parenchymal masses or mass effect. There is no evidence of an infarct. There are no extra-axial masses or abnormal fluid collections. There is no intracranial hemorrhage. The visualized sinuses, mastoid air cells and middle ear cavities are clear. No skull lesion. IMPRESSION: Normal unenhanced CT scan of the brain. Electronically Signed By: Lajean Manes M.D. On: 06/09/2015 18:27   Mr Jodene Nam Head Wo  Contrast  06/10/2015 CLINICAL DATA: Initial evaluation for acute one-day history of double vision in right eye. Dizziness. EXAM: MRI HEAD WITHOUT CONTRAST MRA HEAD WITHOUT CONTRAST TECHNIQUE: Multiplanar, multiecho pulse sequences of the brain and surrounding structures were obtained without intravenous contrast. Angiographic images of the head were obtained using MRA technique without contrast. COMPARISON: Prior CT from 06/09/2015. FINDINGS: MRI HEAD FINDINGS The CSF containing spaces are within normal limits for patient age. Few tiny subcentimeter T2/FLAIR hyperintense foci noted within the periventricular white matter, nonspecific, but felt to be within normal limits for patient age. No mass lesion, midline shift, or extra-axial fluid collection. Ventricles are normal in size without evidence of hydrocephalus. No diffusion-weighted signal abnormality is identified to suggest acute intracranial infarct. Gray-white matter differentiation is maintained. Normal flow voids are seen within the intracranial vasculature. No intracranial hemorrhage identified. The cervicomedullary junction is normal. Pituitary gland is within normal limits. Pituitary stalk is midline. The globes and optic nerves demonstrate a normal appearance with normal signal intensity. The bone marrow signal intensity is normal. Calvarium is intact. Visualized upper cervical spine is within normal limits. Scalp soft tissues are unremarkable. Paranasal sinuses are clear. No mastoid effusion. MRA HEAD FINDINGS ANTERIOR CIRCULATION: Visualized distal cervical segments of the internal carotid arteries are widely patent with antegrade flow. The  petrous, cavernous, and supraclinoid segments are widely patent bilaterally. A1 segments, anterior communicating artery, and anterior cerebral arteries widely patent. M1 segments patent without stenosis or occlusion. Tiny focal outpouching arising from the mid aspect of the left M1 segment  favored to reflect a normal infundibulum. MCA bifurcations within normal limits. Distal MCA branches well opacified and symmetric. POSTERIOR CIRCULATION: Vertebral arteries are codominant and widely patent to the vertebrobasilar junction. Posterior inferior cerebellar arteries patent bilaterally. Basilar artery widely patent. Superior cerebellar and posterior cerebral arteries well opacified bilaterally. No aneurysm or vascular malformation. IMPRESSION: MRI HEAD IMPRESSION: Normal brain MRI with no acute intracranial infarct or other process identified. MRA HEAD IMPRESSION: Normal MRA of the intracranial circulation. Electronically Signed By: Jeannine Boga M.D. On: 06/10/2015 05:03   Mr Brain Wo Contrast  06/10/2015 CLINICAL DATA: Initial evaluation for acute one-day history of double vision in right eye. Dizziness. EXAM: MRI HEAD WITHOUT CONTRAST MRA HEAD WITHOUT CONTRAST TECHNIQUE: Multiplanar, multiecho pulse sequences of the brain and surrounding structures were obtained without intravenous contrast. Angiographic images of the head were obtained using MRA technique without contrast. COMPARISON: Prior CT from 06/09/2015. FINDINGS: MRI HEAD FINDINGS The CSF containing spaces are within normal limits for patient age. Few tiny subcentimeter T2/FLAIR hyperintense foci noted within the periventricular white matter, nonspecific, but felt to be within normal limits for patient age. No mass lesion, midline shift, or extra-axial fluid collection. Ventricles are normal in size without evidence of hydrocephalus. No diffusion-weighted signal abnormality is identified to suggest acute intracranial infarct. Gray-white matter differentiation is maintained. Normal flow voids are seen within the intracranial vasculature. No intracranial hemorrhage identified. The cervicomedullary junction is normal. Pituitary gland is within normal limits. Pituitary stalk is midline. The globes and optic nerves  demonstrate a normal appearance with normal signal intensity. The bone marrow signal intensity is normal. Calvarium is intact. Visualized upper cervical spine is within normal limits. Scalp soft tissues are unremarkable. Paranasal sinuses are clear. No mastoid effusion. MRA HEAD FINDINGS ANTERIOR CIRCULATION: Visualized distal cervical segments of the internal carotid arteries are widely patent with antegrade flow. The petrous, cavernous, and supraclinoid segments are widely patent bilaterally. A1 segments, anterior communicating artery, and anterior cerebral arteries widely patent. M1 segments patent without stenosis or occlusion. Tiny focal outpouching arising from the mid aspect of the left M1 segment favored to reflect a normal infundibulum. MCA bifurcations within normal limits. Distal MCA branches well opacified and symmetric. POSTERIOR CIRCULATION: Vertebral arteries are codominant and widely patent to the vertebrobasilar junction. Posterior inferior cerebellar arteries patent bilaterally. Basilar artery widely patent. Superior cerebellar and posterior cerebral arteries well opacified bilaterally. No aneurysm or vascular malformation. IMPRESSION: MRI HEAD IMPRESSION: Normal brain MRI with no acute intracranial infarct or other process identified. MRA HEAD IMPRESSION: Normal MRA of the intracranial circulation. Electronically Signed By: Jeannine Boga M.D. On: 06/10/2015 05:03     Microbiology: No results found for this or any previous visit (from the past 240 hour(s)).   Labs: Basic Metabolic Panel:  Last Labs      Recent Labs Lab 06/09/15 1746 06/09/15 1753 06/10/15 0517  NA 141 137 135  K 3.8 3.8 3.5  CL 103 104 104  CO2 --  21* 23  GLUCOSE 86 86 98  BUN '13 12 7  '$ CREATININE 0.80 0.72 0.68  CALCIUM --  9.2 8.4*     Liver Function Tests:  Last Labs      Recent Labs Lab 06/09/15 1753 06/10/15 0517  AST 21 19  ALT 16 16  ALKPHOS 37* 34*  BILITOT 0.6 0.6  PROT 7.0 6.2*  ALBUMIN 4.2 3.6      Last Labs     No results for input(s): LIPASE, AMYLASE in the last 168 hours.    Last Labs     No results for input(s): AMMONIA in the last 168 hours.   CBC:  Last Labs      Recent Labs Lab 06/09/15 1746 06/09/15 1753 06/10/15 0517  WBC --  6.0 4.9  NEUTROABS --  1.9 --   HGB 14.6 13.8 13.0  HCT 43.0 41.1 38.7  MCV --  86.0 86.0  PLT --  227 213     Cardiac Enzymes:  Last Labs     No results for input(s): CKTOTAL, CKMB, CKMBINDEX, TROPONINI in the last 168 hours.   BNP: BNP (last 3 results)  Recent Labs (within last 365 days)    No results for input(s): BNP in the last 8760 hours.    ProBNP (last 3 results)  Recent Labs (within last 365 days)    No results for input(s): PROBNP in the last 8760 hours.    CBG:  Last Labs      Recent Labs Lab 06/09/15 1721  GLUCAP 88      Signed: Micayla Zeltman PA-C [facility] 06/10/2015, 1:56 PM  Attending MD note  Patient was seen, examined,treatment plan was discussed with the PA. I have personally reviewed the clinical findings, lab, imaging studies and management of this patient in detail. I agree with the documentation, as recorded by the PA-S   Admitted with transient Diplopia-resolved. No focal findings on exam. MRI Brain neg for CVA, MRA Brain negative for significant stenosis. Echo shows preserved EF-no embolic source. Carotid Doppler neg-but showed ?AV Fistula on the left. CTA neck planned-but patient refused. Case discussed with Dr Janann Colonel who felt that doppler findings were likely unrelated to patients clinical presentation. Being discharged home in a stable manner-instructed to follow with her PCP    [facility]

## 2015-06-12 NOTE — Patient Instructions (Signed)
Transient Ischemic Attack °A transient ischemic attack (TIA) is a "warning stroke" that causes stroke-like symptoms. Unlike a stroke, a TIA does not cause permanent damage to the brain. The symptoms of a TIA can happen very fast and do not last long. It is important to know the symptoms of a TIA and what to do. This can help prevent a major stroke or death. °CAUSES  °A TIA is caused by a temporary blockage in an artery in the brain or neck (carotid artery). The blockage does not allow the brain to get the blood supply it needs and can cause different symptoms. The blockage can be caused by either: °· A blood clot. °· Fatty buildup (plaque) in a neck or brain artery. °RISK FACTORS °· High blood pressure (hypertension). °· High cholesterol. °· Diabetes mellitus. °· Heart disease. °· The buildup of plaque in the blood vessels (peripheral artery disease or atherosclerosis). °· The buildup of plaque in the blood vessels that provide blood and oxygen to the brain (carotid artery stenosis). °· An abnormal heart rhythm (atrial fibrillation). °· Obesity. °· Using any tobacco products, including cigarettes, chewing tobacco, or electronic cigarettes. °· Taking oral contraceptives, especially in combination with using tobacco. °· Physical inactivity. °· A diet high in fats, salt (sodium), and calories. °· Excessive alcohol use. °· Use of illegal drugs (especially cocaine and methamphetamine). °· Being female. °· Being African American. °· Being over the age of 55 years. °· Family history of stroke. °· Previous history of blood clots, stroke, TIA, or heart attack. °· Sickle cell disease. °SIGNS AND SYMPTOMS  °TIA symptoms are the same as a stroke but are temporary. These symptoms usually develop suddenly, or may be newly present upon waking from sleep: °· Sudden weakness or numbness of the face, arm, or leg, especially on one side of the body. °· Sudden trouble walking or difficulty moving arms or legs. °· Sudden  confusion. °· Sudden personality changes. °· Trouble speaking (aphasia) or understanding. °· Difficulty swallowing. °· Sudden trouble seeing in one or both eyes. °· Double vision. °· Dizziness. °· Loss of balance or coordination. °· Sudden severe headache with no known cause. °· Trouble reading or writing. °· Loss of bowel or bladder control. °· Loss of consciousness. °DIAGNOSIS  °Your health care provider may be able to determine the presence or absence of a TIA based on your symptoms, history, and physical exam. CT scan of the brain is usually performed to help identify a TIA. Other tests may include: °· Electrocardiography (ECG). °· Continuous heart monitoring. °· Echocardiography. °· Carotid ultrasonography. °· MRI. °· A scan of the brain circulation. °· Blood tests. °TREATMENT  °Since the symptoms of TIA are the same as a stroke, it is important to seek treatment as soon as possible. You may need a medicine to dissolve a blood clot (thrombolytic) if that is the cause of the TIA. This medicine cannot be given if too much time has passed. Treatment may also include:  °· Rest, oxygen, fluids through an IV tube, and medicines to thin the blood (anticoagulants). °· Measures will be taken to prevent short-term and long-term complications, including infection from breathing foreign material into the lungs (aspiration pneumonia), blood clots in the legs, and falls. °· Procedures to either remove plaque in the carotid arteries or dilate carotid arteries that have narrowed due to plaque. Those procedures are: °¨ Carotid endarterectomy. °¨ Carotid angioplasty and stenting. °· Medicines and diet may be used to address diabetes, high blood pressure, and   other underlying risk factors. °HOME CARE INSTRUCTIONS  °· Take medicines only as directed by your health care provider. Follow the directions carefully. Medicines may be used to control risk factors for a stroke. Be sure you understand all your medicine instructions. °· You  may be told to take aspirin or the anticoagulant warfarin. Warfarin needs to be taken exactly as instructed. °¨ Taking too much or too little warfarin is dangerous. Too much warfarin increases the risk of bleeding. Too little warfarin continues to allow the risk for blood clots. While taking warfarin, you will need to have regular blood tests to measure your blood clotting time. A PT blood test measures how long it takes for blood to clot. Your PT is used to calculate another value called an INR. Your PT and INR help your health care provider to adjust your dose of warfarin. The dose can change for many reasons. It is critically important that you take warfarin exactly as prescribed. °¨ Many foods, especially foods high in vitamin K can interfere with warfarin and affect the PT and INR. Foods high in vitamin K include spinach, kale, broccoli, cabbage, collard and turnip greens, Brussels sprouts, peas, cauliflower, seaweed, and parsley, as well as beef and pork liver, green tea, and soybean oil. You should eat a consistent amount of foods high in vitamin K. Avoid major changes in your diet, or notify your health care provider before changing your diet. Arrange a visit with a dietitian to answer your questions. °¨ Many medicines can interfere with warfarin and affect the PT and INR. You must tell your health care provider about any and all medicines you take; this includes all vitamins and supplements. Be especially cautious with aspirin and anti-inflammatory medicines. Do not take or discontinue any prescribed or over-the-counter medicine except on the advice of your health care provider or pharmacist. °¨ Warfarin can have side effects, such as excessive bruising or bleeding. You will need to hold pressure over cuts for longer than usual. Your health care provider or pharmacist will discuss other potential side effects. °¨ Avoid sports or activities that may cause injury or bleeding. °¨ Be careful when shaving,  flossing your teeth, or handling sharp objects. °¨ Alcohol can change the body's ability to handle warfarin. It is best to avoid alcoholic drinks or consume only very small amounts while taking warfarin. Notify your health care provider if you change your alcohol intake. °¨ Notify your dentist or other health care providers before procedures. °· Eat a diet that includes 5 or more servings of fruits and vegetables each day. This may reduce the risk of stroke. Certain diets may be prescribed to address high blood pressure, high cholesterol, diabetes, or obesity. °¨ A diet low in sodium, saturated fat, trans fat, and cholesterol is recommended to manage high blood pressure. °¨ A diet low in saturated fat, trans fat, and cholesterol, and high in fiber may control cholesterol levels. °¨ A controlled-carbohydrate, controlled-sugar diet is recommended to manage diabetes. °¨ A reduced-calorie diet that is low in sodium, saturated fat, trans fat, and cholesterol is recommended to manage obesity. °· Maintain a healthy weight. °· Stay physically active. It is recommended that you get at least 30 minutes of activity on most or all days. °· Do not use any tobacco products, including cigarettes, chewing tobacco, or electronic cigarettes. If you need help quitting, ask your health care provider. °· Limit alcohol intake to no more than 1 drink per day for nonpregnant women and 2 drinks   per day for men. One drink equals 12 ounces of beer, 5 ounces of wine, or 1½ ounces of hard liquor. °· Do not abuse drugs. °· A safe home environment is important to reduce the risk of falls. Your health care provider may arrange for specialists to evaluate your home. Having grab bars in the bedroom and bathroom is often important. Your health care provider may arrange for equipment to be used at home, such as raised toilets and a seat for the shower. °· Follow all instructions for follow-up with your health care provider. This is very important.  This includes any referrals and lab tests. Proper follow-up can prevent a stroke or another TIA from occurring. °PREVENTION  °The risk of a TIA can be decreased by appropriately treating high blood pressure, high cholesterol, diabetes, heart disease, and obesity, and by quitting smoking, limiting alcohol, and staying physically active. °SEEK MEDICAL CARE IF: °· You have personality changes. °· You have difficulty swallowing. °· You are seeing double. °· You have dizziness. °· You have a fever. °SEEK IMMEDIATE MEDICAL CARE IF:  °Any of the following symptoms may represent a serious problem that is an emergency. Do not wait to see if the symptoms will go away. Get medical help right away. Call your local emergency services (911 in [place] Do not drive yourself to the hospital. °· You have sudden weakness or numbness of the face, arm, or leg, especially on one side of the body. °· You have sudden trouble walking or difficulty moving arms or legs. °· You have sudden confusion. °· You have trouble speaking (aphasia) or understanding. °· You have sudden trouble seeing in one or both eyes. °· You have a loss of balance or coordination. °· You have a sudden, severe headache with no known cause. °· You have new chest pain or an irregular heartbeat. °· You have a partial or total loss of consciousness. °MAKE SURE YOU:  °· Understand these instructions. °· Will watch your condition. °· Will get help right away if you are not doing well or get worse. °  °This information is not intended to replace advice given to you by your health care provider. Make sure you discuss any questions you have with your health care provider. °  °Document Released: 05/26/2005 Document Revised: 09/06/2014 Document Reviewed: 11/21/2013 °Elsevier Interactive Patient Education ©2016 [employer] ° °

## 2015-06-12 NOTE — Assessment & Plan Note (Signed)
Nancy [name] '325mg'$  Refer to neuro per hosp d/c

## 2015-06-12 NOTE — Progress Notes (Signed)
Patient ID: Nancy [name], female    DOB: [birth year]  Age: 49 y.o. MRN: [id number]    Subjective:  Subjective HPI Nancy [name] presents for f/u from the hospital for TIA.  Pt had episode of dizziness and fullness in head.  Review of Systems  Constitutional: Negative for diaphoresis, appetite change, fatigue and unexpected weight change.  Eyes: Negative for pain, redness and visual disturbance.  Respiratory: Negative for cough, chest tightness, shortness of breath and wheezing.   Cardiovascular: Negative for chest pain, palpitations and leg swelling.  Endocrine: Negative for cold intolerance, heat intolerance, polydipsia, polyphagia and polyuria.  Genitourinary: Negative for dysuria, frequency and difficulty urinating.  Neurological: Negative for dizziness, light-headedness, numbness and headaches.    History Past Medical History  Diagnosis Date  . Fibrocystic breast disease   . Atopic eczema   . Allergy     SEASONAL  . Anemia     [employer]OL  . GERD (gastroesophageal reflux disease)     She has past surgical history that includes Myomectomy (2002).   Her family history includes Breast cancer in her paternal aunt; Diabetes in her father and mother; Gout in her mother; Hypertension in her father. There is no history of Thyroid disease.She reports that she has never smoked. She has never used smokeless tobacco. She reports that she does not drink alcohol or use illicit drugs.  Current Outpatient Prescriptions on File Prior to Visit  Medication Sig Dispense Refill  . aspirin 325 MG tablet Take 1 tablet (325 mg total) by mouth daily. 30 tablet 0  . atorvastatin (LIPITOR) 10 MG tablet Take 1 tablet (10 mg total) by mouth daily at 6 PM. 30 tablet 0  . cetirizine (ZYRTEC) 10 MG tablet Take 10 mg by mouth daily as needed. For allergies     . Cholecalciferol (VITAMIN D3) 5000 UNITS CAPS Take 1 capsule by mouth daily.    . meclizine (ANTIVERT) 50 MG tablet Take 1 tablet (50  mg total) by mouth 3 (three) times daily as needed. (Patient taking differently: Take 50 mg by mouth 3 (three) times daily as needed for dizziness. ) 30 tablet 0  . VITAMIN K PO Take 1 capsule by mouth daily.     No current facility-administered medications on file prior to visit.     Objective:  Objective Physical Exam  Constitutional: She is oriented to person, place, and time. She appears well-developed and well-nourished.  HENT:  Head: Normocephalic and atraumatic.  Eyes: Conjunctivae and EOM are normal.  Neck: Normal range of motion. Neck supple. No JVD present. Carotid bruit is not present. No thyromegaly present.  Cardiovascular: Normal rate, regular rhythm and normal heart sounds.   No murmur heard. Pulmonary/Chest: Effort normal and breath sounds normal. No respiratory distress. She has no wheezes. She has no rales. She exhibits no tenderness.  Musculoskeletal: She exhibits no edema.  Neurological: She is alert and oriented to person, place, and time. She has normal strength and normal reflexes. No cranial nerve deficit or sensory deficit.  Psychiatric: She has a normal mood and affect. Her behavior is normal. Judgment and thought content normal.  Nursing note and vitals reviewed.  BP 132/77 mmHg  Pulse 68  Temp(Src) 98.3 F (36.8 C) (Oral)  Wt 155 lb (70.308 kg)  SpO2 97%  LMP 05/13/2015 (Exact Date) Wt Readings from Last 3 Encounters:  06/12/15 155 lb (70.308 kg)  06/09/15 157 lb 9.6 oz (71.487 kg)  06/09/15 158 lb 9.6 oz (71.94 kg)  Lab Results  Component Value Date   WBC 4.9 06/10/2015   HGB 13.0 06/10/2015   HCT 38.7 06/10/2015   PLT 213 06/10/2015   GLUCOSE 98 06/10/2015   CHOL 142 06/10/2015   TRIG 33 06/10/2015   HDL 52 06/10/2015   LDLCALC 83 06/10/2015   ALT 16 06/10/2015   AST 19 06/10/2015   NA 135 06/10/2015   K 3.5 06/10/2015   CL 104 06/10/2015   CREATININE 0.68 06/10/2015   BUN 7 06/10/2015   CO2 23 06/10/2015   TSH 3.727 06/10/2015     INR 1.09 06/09/2015   HGBA1C 5.7* 06/10/2015    Ct Head Wo Contrast  06/09/2015  CLINICAL DATA:  Dizziness and double vision today. History of vertigo. No trauma. EXAM: CT HEAD WITHOUT CONTRAST TECHNIQUE: Contiguous axial images were obtained from the base of the skull through the vertex without intravenous contrast. COMPARISON:  None. FINDINGS: Ventricles are normal in size and configuration. There are no parenchymal masses or mass effect. There is no evidence of an infarct. There are no extra-axial masses or abnormal fluid collections. There is no intracranial hemorrhage. The visualized sinuses, mastoid air cells and middle ear cavities are clear. No skull lesion. IMPRESSION: Normal unenhanced CT scan of the brain. Electronically Signed   By: Lajean Manes M.D.   On: 06/09/2015 18:27   Mr Jodene Nam Head Wo Contrast  06/10/2015  CLINICAL DATA:  Initial evaluation for acute one-day history of double vision in right eye. Dizziness. EXAM: MRI HEAD WITHOUT CONTRAST MRA HEAD WITHOUT CONTRAST TECHNIQUE: Multiplanar, multiecho pulse sequences of the brain and surrounding structures were obtained without intravenous contrast. Angiographic images of the head were obtained using MRA technique without contrast. COMPARISON:  Prior CT from 06/09/2015. FINDINGS: MRI HEAD FINDINGS The CSF containing spaces are within normal limits for patient age. Few tiny subcentimeter T2/FLAIR hyperintense foci noted within the periventricular white matter, nonspecific, but felt to be within normal limits for patient age. No mass lesion, midline shift, or extra-axial fluid collection. Ventricles are normal in size without evidence of hydrocephalus. No diffusion-weighted signal abnormality is identified to suggest acute intracranial infarct. Gray-white matter differentiation is maintained. Normal flow voids are seen within the intracranial vasculature. No intracranial hemorrhage identified. The cervicomedullary junction is normal.  Pituitary gland is within normal limits. Pituitary stalk is midline. The globes and optic nerves demonstrate a normal appearance with normal signal intensity. The bone marrow signal intensity is normal. Calvarium is intact. Visualized upper cervical spine is within normal limits. Scalp soft tissues are unremarkable. Paranasal sinuses are clear.  No mastoid effusion. MRA HEAD FINDINGS ANTERIOR CIRCULATION: Visualized distal cervical segments of the internal carotid arteries are widely patent with antegrade flow. The petrous, cavernous, and supraclinoid segments are widely patent bilaterally. A1 segments, anterior communicating artery, and anterior cerebral arteries widely patent. M1 segments patent without stenosis or occlusion. Tiny focal outpouching arising from the mid aspect of the left M1 segment favored to reflect a normal infundibulum. MCA bifurcations within normal limits. Distal MCA branches well opacified and symmetric. POSTERIOR CIRCULATION: Vertebral arteries are codominant and widely patent to the vertebrobasilar junction. Posterior inferior cerebellar arteries patent bilaterally. Basilar artery widely patent. Superior cerebellar and posterior cerebral arteries well opacified bilaterally. No aneurysm or vascular malformation. IMPRESSION: MRI HEAD IMPRESSION: Normal brain MRI with no acute intracranial infarct or other process identified. MRA HEAD IMPRESSION: Normal MRA of the intracranial circulation. Electronically Signed   By: Jeannine Boga M.D.   On: 06/10/2015 05:03  Mr Brain Wo Contrast  06/10/2015  CLINICAL DATA:  Initial evaluation for acute one-day history of double vision in right eye. Dizziness. EXAM: MRI HEAD WITHOUT CONTRAST MRA HEAD WITHOUT CONTRAST TECHNIQUE: Multiplanar, multiecho pulse sequences of the brain and surrounding structures were obtained without intravenous contrast. Angiographic images of the head were obtained using MRA technique without contrast. COMPARISON:   Prior CT from 06/09/2015. FINDINGS: MRI HEAD FINDINGS The CSF containing spaces are within normal limits for patient age. Few tiny subcentimeter T2/FLAIR hyperintense foci noted within the periventricular white matter, nonspecific, but felt to be within normal limits for patient age. No mass lesion, midline shift, or extra-axial fluid collection. Ventricles are normal in size without evidence of hydrocephalus. No diffusion-weighted signal abnormality is identified to suggest acute intracranial infarct. Gray-white matter differentiation is maintained. Normal flow voids are seen within the intracranial vasculature. No intracranial hemorrhage identified. The cervicomedullary junction is normal. Pituitary gland is within normal limits. Pituitary stalk is midline. The globes and optic nerves demonstrate a normal appearance with normal signal intensity. The bone marrow signal intensity is normal. Calvarium is intact. Visualized upper cervical spine is within normal limits. Scalp soft tissues are unremarkable. Paranasal sinuses are clear.  No mastoid effusion. MRA HEAD FINDINGS ANTERIOR CIRCULATION: Visualized distal cervical segments of the internal carotid arteries are widely patent with antegrade flow. The petrous, cavernous, and supraclinoid segments are widely patent bilaterally. A1 segments, anterior communicating artery, and anterior cerebral arteries widely patent. M1 segments patent without stenosis or occlusion. Tiny focal outpouching arising from the mid aspect of the left M1 segment favored to reflect a normal infundibulum. MCA bifurcations within normal limits. Distal MCA branches well opacified and symmetric. POSTERIOR CIRCULATION: Vertebral arteries are codominant and widely patent to the vertebrobasilar junction. Posterior inferior cerebellar arteries patent bilaterally. Basilar artery widely patent. Superior cerebellar and posterior cerebral arteries well opacified bilaterally. No aneurysm or vascular  malformation. IMPRESSION: MRI HEAD IMPRESSION: Normal brain MRI with no acute intracranial infarct or other process identified. MRA HEAD IMPRESSION: Normal MRA of the intracranial circulation. Electronically Signed   By: Jeannine Boga M.D.   On: 06/10/2015 05:03     Assessment & Plan:  Plan I am having Nancy [name] maintain her cetirizine, meclizine, Vitamin D3, VITAMIN K PO, aspirin, and atorvastatin.  No orders of the defined types were placed in this encounter.    Problem List Items Addressed This Visit    Vertigo   Relevant Orders   Ambulatory referral to Neurology    Other Visit Diagnoses    Other specified transient cerebral ischemias    -  Primary    Relevant Orders    Comp Met (CMET)    Lipid panel    Hemoglobin A1c    Double vision        Relevant Orders    Ambulatory referral to Neurology    Hyperlipidemia LDL goal <70        Relevant Orders    Comp Met (CMET)    Lipid panel    Hemoglobin A1c       Follow-up: Return if symptoms worsen or fail to improve, for hypertension, hyperlipidemia.  Garnet Koyanagi, DO

## 2015-06-12 NOTE — Progress Notes (Signed)
Pre visit review using our clinic review tool, if applicable. No additional management support is needed unless otherwise documented below in the visit note. 

## 2015-06-13 ENCOUNTER — Emergency Department (HOSPITAL_COMMUNITY)
Admission: EM | Admit: 2015-06-13 | Discharge: 2015-06-13 | Disposition: A | Discharge status: Home / Self Care | Payer: Federal, State, Local not specified - PPO | Attending: Emergency Medicine | Admitting: Emergency Medicine

## 2015-06-13 ENCOUNTER — Encounter (HOSPITAL_COMMUNITY): Payer: Self-pay | Admitting: Emergency Medicine

## 2015-06-13 DIAGNOSIS — Z880 Allergy status to penicillin: Secondary | ICD-10-CM | POA: Diagnosis not present

## 2015-06-13 DIAGNOSIS — Z8742 Personal history of other diseases of the female genital tract: Secondary | ICD-10-CM | POA: Insufficient documentation

## 2015-06-13 DIAGNOSIS — F419 Anxiety disorder, unspecified: Secondary | ICD-10-CM

## 2015-06-13 DIAGNOSIS — Z8719 Personal history of other diseases of the digestive system: Secondary | ICD-10-CM | POA: Insufficient documentation

## 2015-06-13 DIAGNOSIS — R0602 Shortness of breath: Secondary | ICD-10-CM | POA: Diagnosis not present

## 2015-06-13 DIAGNOSIS — Z7982 Long term (current) use of aspirin: Secondary | ICD-10-CM | POA: Diagnosis not present

## 2015-06-13 DIAGNOSIS — R519 Headache, unspecified: Secondary | ICD-10-CM

## 2015-06-13 DIAGNOSIS — H532 Diplopia: Secondary | ICD-10-CM | POA: Diagnosis not present

## 2015-06-13 DIAGNOSIS — Z862 Personal history of diseases of the blood and blood-forming organs and certain disorders involving the immune mechanism: Secondary | ICD-10-CM | POA: Insufficient documentation

## 2015-06-13 DIAGNOSIS — Z8673 Personal history of transient ischemic attack (TIA), and cerebral infarction without residual deficits: Secondary | ICD-10-CM | POA: Insufficient documentation

## 2015-06-13 DIAGNOSIS — R42 Dizziness and giddiness: Secondary | ICD-10-CM | POA: Insufficient documentation

## 2015-06-13 DIAGNOSIS — M25571 Pain in right ankle and joints of right foot: Secondary | ICD-10-CM | POA: Insufficient documentation

## 2015-06-13 DIAGNOSIS — Z872 Personal history of diseases of the skin and subcutaneous tissue: Secondary | ICD-10-CM | POA: Insufficient documentation

## 2015-06-13 DIAGNOSIS — R232 Flushing: Secondary | ICD-10-CM | POA: Insufficient documentation

## 2015-06-13 DIAGNOSIS — R51 Headache: Secondary | ICD-10-CM | POA: Insufficient documentation

## 2015-06-13 DIAGNOSIS — Z79899 Other long term (current) drug therapy: Secondary | ICD-10-CM | POA: Insufficient documentation

## 2015-06-13 DIAGNOSIS — R202 Paresthesia of skin: Secondary | ICD-10-CM | POA: Diagnosis present

## 2015-06-13 HISTORY — DX: Transient cerebral ischemic attack, unspecified: G45.9

## 2015-06-13 LAB — CBC WITH DIFFERENTIAL/PLATELET
Basophils Absolute: 0 10*3/uL (ref 0.0–0.1)
Basophils Relative: 1 %
Eosinophils Absolute: 0 10*3/uL (ref 0.0–0.7)
Eosinophils Relative: 1 %
HCT: 41.4 % (ref 36.0–46.0)
Hemoglobin: 13.7 g/dL (ref 12.0–15.0)
Lymphocytes Relative: 36 %
Lymphs Abs: 1.4 10*3/uL (ref 0.7–4.0)
MCH: 28.4 pg (ref 26.0–34.0)
MCHC: 33.1 g/dL (ref 30.0–36.0)
MCV: 85.7 fL (ref 78.0–100.0)
Monocytes Absolute: 0.2 10*3/uL (ref 0.1–1.0)
Monocytes Relative: 5 %
Neutro Abs: 2.2 10*3/uL (ref 1.7–7.7)
Neutrophils Relative %: 59 %
Platelets: 223 10*3/uL (ref 150–400)
RBC: 4.83 MIL/uL (ref 3.87–5.11)
RDW: 12.8 % (ref 11.5–15.5)
WBC: 3.8 10*3/uL — ABNORMAL LOW (ref 4.0–10.5)

## 2015-06-13 LAB — BASIC METABOLIC PANEL
Anion gap: 5 (ref 5–15)
BUN: 8 mg/dL (ref 6–20)
CO2: 28 mmol/L (ref 22–32)
Calcium: 9.5 mg/dL (ref 8.9–10.3)
Chloride: 105 mmol/L (ref 101–111)
Creatinine, Ser: 0.69 mg/dL (ref 0.44–1.00)
GFR calc Af Amer: >60 mL/min (ref >60)
GFR calc non Af Amer: >60 mL/min (ref >60)
Glucose, Bld: 101 mg/dL — ABNORMAL HIGH (ref 65–99)
Potassium: 3.8 mmol/L (ref 3.5–5.1)
Sodium: 138 mmol/L (ref 135–145)

## 2015-06-13 LAB — CK: Total CK: 65 U/L (ref 38–234)

## 2015-06-13 LAB — I-STAT TROPONIN, ED: Troponin i, poc: 0 ng/mL (ref 0.00–0.08)

## 2015-06-13 MED ORDER — LORAZEPAM 1 MG PO TABS
1.0000 mg | ORAL_TABLET | Freq: Once | ORAL | Status: AC
  Administered 2015-06-13: 1 mg via ORAL
  Filled 2015-06-13: qty 1

## 2015-06-13 NOTE — Discharge Instructions (Signed)
Read the information below.  You may return to the Emergency Department at any time for worsening condition or any new symptoms that concern you.  If you develop fevers, severe headache, weakness or numbness of the arms or legs, difficulty speaking or walking, uncontrolled dizziness, or if you pass out, call 911 and return to the Emergency Department immediately.     General Headache Without Cause A headache is pain or discomfort felt around the head or neck area. The specific cause of a headache may not be found. There are many causes and types of headaches. A few common ones are:  Tension headaches.  Migraine headaches.  Cluster headaches.  Chronic daily headaches. HOME CARE INSTRUCTIONS  Watch your condition for any changes. Take these steps to help with your condition: Managing Pain  Take over-the-counter and prescription medicines only as told by your health care provider.  Lie down in a dark, quiet room when you have a headache.  If directed, apply ice to the head and neck area:  Put ice in a plastic bag.  Place a towel between your skin and the bag.  Leave the ice on for 20 minutes, 2-3 times per day.  Use a heating pad or hot shower to apply heat to the head and neck area as told by your health care provider.  Keep lights dim if bright lights bother you or make your headaches worse. Eating and Drinking  Eat meals on a regular schedule.  Limit alcohol use.  Decrease the amount of caffeine you drink, or stop drinking caffeine. General Instructions  Keep all follow-up visits as told by your health care provider. This is important.  Keep a headache journal to help find out what may trigger your headaches. For example, write down:  What you eat and drink.  How much sleep you get.  Any change to your diet or medicines.  Try massage or other relaxation techniques.  Limit stress.  Sit up straight, and do not tense your muscles.  Do not use tobacco products,  including cigarettes, chewing tobacco, or e-cigarettes. If you need help quitting, ask your health care provider.  Exercise regularly as told by your health care provider.  Sleep on a regular schedule. Get 7-9 hours of sleep, or the amount recommended by your health care provider. SEEK MEDICAL CARE IF:   Your symptoms are not helped by medicine.  You have a headache that is different from the usual headache.  You have nausea or you vomit.  You have a fever. SEEK IMMEDIATE MEDICAL CARE IF:   Your headache becomes severe.  You have repeated vomiting.  You have a stiff neck.  You have a loss of vision.  You have problems with speech.  You have pain in the eye or ear.  You have muscular weakness or loss of muscle control.  You lose your balance or have trouble walking.  You feel faint or pass out.  You have confusion.   This information is not intended to replace advice given to you by your health care provider. Make sure you discuss any questions you have with your health care provider.   Document Released: 08/16/2005 Document Revised: 05/07/2015 Document Reviewed: 12/09/2014 Elsevier Interactive Patient Education 2016 [employer]  Dizziness Dizziness is a common problem. It is a feeling of unsteadiness or light-headedness. You may feel like you are about to faint. Dizziness can lead to injury if you stumble or fall. Anyone can become dizzy, but dizziness is more common in  older adults. This condition can be caused by a number of things, including medicines, dehydration, or illness. HOME CARE INSTRUCTIONS Taking these steps may help with your condition: Eating and Drinking  Drink enough fluid to keep your urine clear or pale yellow. This helps to keep you from becoming dehydrated. Try to drink more clear fluids, such as water.  Do not drink alcohol.  Limit your caffeine intake if directed by your health care provider.  Limit your salt intake if directed by your  health care provider. Activity  Avoid making quick movements.  Rise slowly from chairs and steady yourself until you feel okay.  In the morning, first sit up on the side of the bed. When you feel okay, stand slowly while you hold onto something until you know that your balance is fine.  Move your legs often if you need to stand in one place for a long time. Tighten and relax your muscles in your legs while you are standing.  Do not drive or operate heavy machinery if you feel dizzy.  Avoid bending down if you feel dizzy. Place items in your home so that they are easy for you to reach without leaning over. Lifestyle  Do not use any tobacco products, including cigarettes, chewing tobacco, or electronic cigarettes. If you need help quitting, ask your health care provider.  Try to reduce your stress level, such as with yoga or meditation. Talk with your health care provider if you need help. General Instructions  Watch your dizziness for any changes.  Take medicines only as directed by your health care provider. Talk with your health care provider if you think that your dizziness is caused by a medicine that you are taking.  Tell a friend or a family member that you are feeling dizzy. If he or she notices any changes in your behavior, have this person call your health care provider.  Keep all follow-up visits as directed by your health care provider. This is important. SEEK MEDICAL CARE IF:  Your dizziness does not go away.  Your dizziness or light-headedness gets worse.  You feel nauseous.  You have reduced hearing.  You have new symptoms.  You are unsteady on your feet or you feel like the room is spinning. SEEK IMMEDIATE MEDICAL CARE IF:  You vomit or have diarrhea and are unable to eat or drink anything.  You have problems talking, walking, swallowing, or using your arms, hands, or legs.  You feel generally weak.  You are not thinking clearly or you have trouble  forming sentences. It may take a friend or family member to notice this.  You have chest pain, abdominal pain, shortness of breath, or sweating.  Your vision changes.  You notice any bleeding.  You have a headache.  You have neck pain or a stiff neck.  You have a fever.   This information is not intended to replace advice given to you by your health care provider. Make sure you discuss any questions you have with your health care provider.   Document Released: 02/09/2001 Document Revised: 12/31/2014 Document Reviewed: 08/12/2014 Elsevier Interactive Patient Education [employer].

## 2015-06-13 NOTE — ED Provider Notes (Signed)
CSN: [id number]     Arrival date & time 06/13/15  0905 History   First MD Initiated Contact with Patient 06/13/15 1006     Chief Complaint  Patient presents with  . Tingling     (Consider location/radiation/quality/duration/timing/severity/associated sxs/prior Treatment) The history is provided by the patient.     Pt with hx vertigo, recent admission for TIA, p/w flushing in the face and head with roaming pressure and pain in different parts of her head and bilateral neck that began this morning.  She has also had an episode of "heartburn" around 7:50am, heart pounding, and SOB.  The roaming pressure around her head feels similar to prior episodes of vertigo.  She took a meclizine with mild improvement.  The flushing is unusual for her, as is the now resolved episode of heartburn and palpitations.  Also notes that overnight had cramping in her right posterior ankle.  Her TIA symptom was diplopia. She was started on aspirin and lipitor.  Took her first dose of lipitor last night.    Past Medical History  Diagnosis Date  . Fibrocystic breast disease   . Atopic eczema   . Allergy     SEASONAL  . Anemia     H[employer]OL  . GERD (gastroesophageal reflux disease)   . TIA (transient ischemic attack)    Past Surgical History  Procedure Laterality Date  . Myomectomy  2002   Family History  Problem Relation Age of Onset  . Gout Mother   . Diabetes Mother   . Hypertension Father   . Diabetes Father   . Breast cancer Paternal Aunt   . Thyroid disease Neg Hx    Social History  Substance Use Topics  . Smoking status: Never Smoker   . Smokeless tobacco: Never Used  . Alcohol Use: Yes     Comment: maybe 3 times a year   OB History    No data available     Review of Systems  All other systems reviewed and are negative.     Allergies  Penicillins  Home Medications   Prior to Admission medications   Medication Sig Start Date End Date Taking? Authorizing Provider  aspirin  325 MG tablet Take 1 tablet (325 mg total) by mouth daily. 06/10/15  Yes Shanker Kristeen Mans, MD  atorvastatin (LIPITOR) 10 MG tablet Take 1 tablet (10 mg total) by mouth daily at 6 PM. 06/10/15  Yes Shanker Kristeen Mans, MD  cetirizine (ZYRTEC) 10 MG tablet Take 10 mg by mouth daily as needed. For allergies    Yes Historical Provider, MD  Cholecalciferol (VITAMIN D3) 5000 UNITS CAPS Take 1 capsule by mouth daily.   Yes Historical Provider, MD  meclizine (ANTIVERT) 50 MG tablet Take 1 tablet (50 mg total) by mouth 3 (three) times daily as needed. Patient taking differently: Take 50 mg by mouth 3 (three) times daily as needed for dizziness.  09/23/14  Yes Rosalita Chessman, DO  Prenatal Vit-Fe Fumarate-FA (PRENATAL MULTIVITAMIN) TABS tablet Take 2 tablets by mouth daily at 12 noon.   Yes Historical Provider, MD  VITAMIN K PO Take 1 capsule by mouth daily.   Yes Historical Provider, MD   BP 125/69 mmHg  Pulse 77  Temp(Src) 98.4 F (36.9 C) (Oral)  Resp 21  Ht '5\' 6"'$  (1.676 m)  Wt 152 lb (68.947 kg)  BMI 24.55 kg/m2  SpO2 100%  LMP 06/10/2015 Physical Exam  Constitutional: She appears well-developed and well-nourished. No distress.  HENT:  Head: Normocephalic and atraumatic.  Neck: Neck supple.  Cardiovascular: Normal rate and regular rhythm.   Pulmonary/Chest: Effort normal and breath sounds normal. No respiratory distress. She has no wheezes. She has no rales.  Abdominal: Soft. She exhibits no distension. There is no tenderness. There is no rebound and no guarding.  Neurological: She is alert.  CN II-XII intact, EOMs intact, no pronator drift, grip strengths equal bilaterally; strength 5/5 in all extremities, sensation intact in all extremities; finger to nose, heel to shin, rapid alternating movements normal; gait is normal.     Skin: She is not diaphoretic.  Nursing note and vitals reviewed.   ED Course  Procedures (including critical care time) Labs Review Labs Reviewed  BASIC METABOLIC  PANEL - Abnormal; Notable for the following:    Glucose, Bld 101 (*)    All other components within normal limits  CBC WITH DIFFERENTIAL/PLATELET - Abnormal; Notable for the following:    WBC 3.8 (*)    All other components within normal limits  CK  I-STAT TROPOININ, ED    Imaging Review No results found. I have personally reviewed and evaluated these images and lab results as part of my medical decision-making.   EKG Interpretation None       ED ECG REPORT   Date: 06/13/2015  Rate: 70  Rhythm: normal sinus rhythm  QRS Axis: normal  Intervals: normal  ST/T Wave abnormalities: normal  Conduction Disutrbances:none  Narrative Interpretation:   Old EKG Reviewed: none available  I have personally reviewed the EKG tracing and agree with the computerized printout as noted.   11:28 AM Discussed pt with Dr Doy Mince.    1:04 PM Pt has had no further symptoms since the ativan  MDM   Final diagnoses:  Vertigo  Nonintractable headache, unspecified chronicity pattern, unspecified headache type  Anxiety    Afebrile, nontoxic patient with hx vertigo, recent TIA with symptom of diplopia, started on ASA and lipitor p/w pain moving about the head and hot flashes over different parts of the head that began this morning.  She did have some symptoms of CP, SOB, palpitations, and severe anxiety that was likely related to anxiety/panic attack.  The moving pain around the head is actually typical for her vertigo symptoms per patient.  Pt given ativan and all symptoms resolved.  She did just start taking lipitor last night, possibility that she is not tolerating it well.  Workup unremarkable.   D/C home with PCP, neurology follow up.  Discussed result, findings, treatment, and follow up  with patient.  Pt given return precautions.  Pt verbalizes understanding and agrees with plan.         Clayton Bibles, PA-C 06/13/15 1625  Serita Grit, MD 06/13/15 2211

## 2015-06-13 NOTE — ED Notes (Signed)
Pt was admitted on Monday for TIA. Discharged Tuesday. Pt took first dose of lipitor last night, woke up this morning feeling tingling all over. EKG unremarkable. Pt hyperventilating on scene and on ride with EMS. Pt states she has shooting pains down neck, then pressure in sinuses. Pt refused IV for EMS. No signs of stroke per EMS. BP 132/86, HR 64, CBG 88

## 2015-06-13 NOTE — ED Notes (Signed)
Notified phlebotomy to please let ativan start working for pt's nerves then come back in 20 min to draw labs

## 2015-06-13 NOTE — ED Notes (Signed)
Pt states she went to work this morning and felt flushed and tingling all over. Pt panting and states she feels anxious

## 2015-06-13 NOTE — ED Notes (Signed)
Pt states symptoms started around 0750 this morning. Pt woke up normal, then felt muscle soreness.

## 2015-06-16 ENCOUNTER — Telehealth: Payer: Self-pay

## 2015-06-16 ENCOUNTER — Encounter: Payer: Self-pay | Admitting: Neurology

## 2015-06-16 ENCOUNTER — Other Ambulatory Visit: Payer: Self-pay | Admitting: Family Medicine

## 2015-06-16 ENCOUNTER — Ambulatory Visit (INDEPENDENT_AMBULATORY_CARE_PROVIDER_SITE_OTHER): Payer: Federal, State, Local not specified - PPO | Admitting: Neurology

## 2015-06-16 VITALS — BP 110/78 | HR 78 | Ht 66.0 in | Wt 156.0 lb

## 2015-06-16 DIAGNOSIS — R42 Dizziness and giddiness: Secondary | ICD-10-CM | POA: Diagnosis not present

## 2015-06-16 DIAGNOSIS — H532 Diplopia: Secondary | ICD-10-CM | POA: Diagnosis not present

## 2015-06-16 NOTE — Telephone Encounter (Signed)
-----   Message from Nancy Chessman, DO sent at 06/16/2015  8:37 AM EDT ----- Pt went to ER day after she was here--- how is she feeling now.  Does she want to come in?

## 2015-06-16 NOTE — Progress Notes (Signed)
NEUROLOGY CONSULTATION NOTE  Nancy [name] MRN: [id number] DOB: 11-21-1965  Referring provider: Dr. Etter Sjogren Primary care provider: Dr. Etter Sjogren  Reason for consult:  TIA/vertigo  HISTORY OF PRESENT ILLNESS: Nancy [name] is a 49 year old right-handed woman with past history of vertigo, who presents for TIA and vertigo.  History obtained by patient, as well as hospital, PCP and ED notes.  She is accompanied by her husband who also provides some history.  Images of CT, MRI and MRA reviewed.  Labs and reports of carotid doppler and echo reviewed.  On 06/09/15, she had two 15 minute episodes of mixed vertical and horizontal diplopia with right frontal and occipital pressure  Afterwards, she started to develop her habitual vertigo symptoms.  She went to [facility], where she was admitted for a TIA workup.  CT of head was normal.  MRI of the brain showed few nonspecific punctate T2 and FLAIR hypertense foci in the periventricular white matter, but otherwise unremarkable.  MRA of the head was normal.  2D echo showed EF 62-83% with no embolic source.  Carotid doppler showed no hemodynamically significant internal carotid artery stenosis.  It did show atypical flow to the left internal jugular vein and left internal carotid artery, suggesting an AV fistula.  LDL was 83 and Hgb A1c was 5.7.  She was started on ASA '325mg'$  daily and Lipitor '10mg'$  daily.  She returned to the ED on 06/13/15 with burning sensation of chest, shoulders, neck and head, associated with palpitations and shortness of breath.  EKG, and labs such as troponins, were negative.  Since it occurred after taking Lipitor, it was advised not to take it.  Panic attack was also suspected.  She has history of recurrent vertigo.  They usually occur twice a year, in the Fall and in the Spring.  They last about a week.  They usually occur first thing in the morning.  She describes right sided head pressure as well as dizzy sensation with some  component of spinning.  It is not brief or just positional.  There is associated photophobia.  They respond to meclizine.  She denies personal or family history of migraines.  She does report seasonal headaches attributed to allergies.  They usually respond to Lyman.  PAST MEDICAL HISTORY: Past Medical History  Diagnosis Date  . Fibrocystic breast disease   . Atopic eczema   . Allergy     SEASONAL  . Anemia     HIGH SCHOOL  . GERD (gastroesophageal reflux disease)   . TIA (transient ischemic attack)     PAST SURGICAL HISTORY: Past Surgical History  Procedure Laterality Date  . Myomectomy  2002    MEDICATIONS: Current Outpatient Prescriptions on File Prior to Visit  Medication Sig Dispense Refill  . aspirin 325 MG tablet Take 1 tablet (325 mg total) by mouth daily. 30 tablet 0  . cetirizine (ZYRTEC) 10 MG tablet Take 10 mg by mouth daily as needed. For allergies     . Cholecalciferol (VITAMIN D3) 5000 UNITS CAPS Take 1 capsule by mouth daily.    . meclizine (ANTIVERT) 50 MG tablet Take 1 tablet (50 mg total) by mouth 3 (three) times daily as needed. (Patient taking differently: Take 50 mg by mouth 3 (three) times daily as needed for dizziness. ) 30 tablet 0  . Prenatal Vit-Fe Fumarate-FA (PRENATAL MULTIVITAMIN) TABS tablet Take 2 tablets by mouth daily at 12 noon.    Marland Kitchen VITAMIN K PO Take 1 capsule by mouth  daily.     No current facility-administered medications on file prior to visit.    ALLERGIES: Allergies  Allergen Reactions  . Penicillins Anaphylaxis    Has patient had a PCN reaction causing immediate rash, facial/tongue/throat swelling, SOB or lightheadedness with hypotensionYes Swelling in throat  Has patient had a PCN reaction causing severe rash involving mucus membranes or skin necrosis:/No Has patient had a PCN reaction that required hospitalization/No Has patient had a PCN reaction occurring within the last 10 years:NO If all of the above answers are "NO", then  may proceed with Cephalosporin use.     FAMILY HISTORY: Family History  Problem Relation Age of Onset  . Gout Mother   . Diabetes Mother   . Hypertension Father   . Diabetes Father   . Breast cancer Paternal Aunt   . Thyroid disease Neg Hx     SOCIAL HISTORY: Social History   Social History  . Marital Status: Married    Spouse Name: N/A  . Number of Children: 0  . Years of Education: 18   Occupational History  . [employer]  .     Social History Main Topics  . Smoking status: Never Smoker   . Smokeless tobacco: Never Used  . Alcohol Use: Yes     Comment: maybe 3 times a year  . Drug Use: No  . Sexual Activity:    Partners: Male   Other Topics Concern  . Not on file   Social History Narrative    REVIEW OF SYSTEMS: Constitutional: No fevers, chills, or sweats, no generalized fatigue, change in appetite Eyes: No visual changes, double vision, eye pain Ear, nose and throat: No hearing loss, ear pain, nasal congestion, sore throat Cardiovascular: No chest pain, palpitations Respiratory:  No shortness of breath at rest or with exertion, wheezes GastrointestinaI: No nausea, vomiting, diarrhea, abdominal pain, fecal incontinence Genitourinary:  No dysuria, urinary retention or frequency Musculoskeletal:  No neck pain, back pain Integumentary: No rash, pruritus, skin lesions Neurological: as above Psychiatric: No depression, insomnia, anxiety Endocrine: No palpitations, fatigue, diaphoresis, mood swings, change in appetite, change in weight, increased thirst Hematologic/Lymphatic:  No anemia, purpura, petechiae. Allergic/Immunologic: no itchy/runny eyes, nasal congestion, recent allergic reactions, rashes  PHYSICAL EXAM: Filed Vitals:   06/16/15 1251  BP: 110/78  Pulse: 78   General: No acute distress.  Patient appears well-groomed.  Head:  Normocephalic/atraumatic Eyes:  fundi unremarkable, without vessel changes, exudates, hemorrhages or  papilledema. Neck: supple, no paraspinal tenderness, full range of motion Back: No paraspinal tenderness Heart: regular rate and rhythm Lungs: Clear to auscultation bilaterally. Vascular: No carotid bruits. Neurological Exam: Mental status: alert and oriented to person, place, and time, recent and remote memory intact, fund of knowledge intact, attention and concentration intact, speech fluent and not dysarthric, language intact. Cranial nerves: CN I: not tested CN II: pupils equal, round and reactive to light, visual fields intact, fundi unremarkable, without vessel changes, exudates, hemorrhages or papilledema. CN III, IV, VI:  full range of motion, no nystagmus, no ptosis CN V: facial sensation intact CN VII: upper and lower face symmetric CN VIII: hearing intact CN IX, X: gag intact, uvula midline CN XI: sternocleidomastoid and trapezius muscles intact CN XII: tongue midline Bulk & Tone: normal, no fasciculations. Motor:  5/5 throughout Sensation: temperature and vibration sensation intact. Deep Tendon Reflexes:  2+ throughout, toes downgoing. Finger to nose testing:  Without dysmetria.  Heel to shin:  Without dysmetria.  Gait:  Normal station and stride.  Able to turn and tandem walk. Romberg negative.  IMPRESSION: Episodic diplopia Recurrent vertigo.  Although we don't know for sure, I do not really suspect TIA.  I think vestibular migraine would be more likely.  Her recurrent vertigo may be migraine-related as well.  PLAN: 1.  I would decrease dose of ASA to '81mg'$  daily 2.  Since my suspicion for TIA is low, I would hold restarting statin. 3.  I would like her to follow up in 3 months to make sure everything is doing well.  Thank you for allowing me to take part in the care of this patient.  Metta Clines, DO  CC:  Garnet Koyanagi, DO

## 2015-06-16 NOTE — Patient Instructions (Addendum)
I can't say for sure that it wasn't a TIA, but my suspicion is low.  Vestibular migraine is another possibility. To be safe, I would recommend remaining on aspirin, but you can take '81mg'$  daily (not '325mg'$ ) I would like you to follow up in 3 months to make sure things are okay.

## 2015-06-17 NOTE — Telephone Encounter (Signed)
Spoke with patient and she is still having the pressure in her head and light headaches and sensitivity to light.  Neuro thinks it may Vestibular migraine, he also took the patient off of Lipitor as well.  She will follow up with him in 3 mos.      KP

## 2015-06-18 ENCOUNTER — Telehealth: Payer: Self-pay | Admitting: Neurology

## 2015-06-18 NOTE — Telephone Encounter (Signed)
Left message informing patient.

## 2015-06-18 NOTE — Telephone Encounter (Signed)
I spoke with her. She is seeking some type of accommodations for her job. She states that she is on the computer for majority of her work day. She states that she is having to squint more because of the light sensitivity she states that she has tried to adjust the brightness of her screen and that didn't help. She states that her job does have an accommodation form that she is going to fax over if you didn't want to write a letter.

## 2015-06-18 NOTE — Telephone Encounter (Signed)
Pt called to inform that she's having light sensitivity to her eyes on the job/also increased ha's/is there a note/letter/ that she can give to her employer?//Call back @ [phone]

## 2015-06-18 NOTE — Telephone Encounter (Signed)
Call from the patient and she stated she is having tension in her head and eyes as well as head pressure, light sensitivity, squinting as well and she thinks it is due to the lights at work. She wanted to know if you had any suggestions or can she get a work note seeking accommodations for her Job since she is on the computer for the majority of the work day. She has tried to Call Neuro and was told her PCP would need to complete a letter or any forms. Please advise    KP

## 2015-06-18 NOTE — Telephone Encounter (Signed)
I didn't see her for this issue.  Therefore, I recommend that it be filled by her PCP.

## 2015-06-19 NOTE — Telephone Encounter (Signed)
patient just needs a letter she is scheduled for tomorrow at 2:30.      KP

## 2015-06-19 NOTE — Telephone Encounter (Signed)
Best thing to do is make ov and bring paper work in

## 2015-06-20 ENCOUNTER — Encounter: Payer: Self-pay | Admitting: Family Medicine

## 2015-06-20 ENCOUNTER — Ambulatory Visit (INDEPENDENT_AMBULATORY_CARE_PROVIDER_SITE_OTHER): Payer: Federal, State, Local not specified - PPO | Admitting: Family Medicine

## 2015-06-20 VITALS — BP 117/63 | HR 70 | Temp 98.8°F | Wt 157.8 lb

## 2015-06-20 DIAGNOSIS — G43109 Migraine with aura, not intractable, without status migrainosus: Secondary | ICD-10-CM | POA: Diagnosis not present

## 2015-06-20 DIAGNOSIS — G43809 Other migraine, not intractable, without status migrainosus: Secondary | ICD-10-CM | POA: Insufficient documentation

## 2015-06-20 NOTE — Progress Notes (Signed)
Pre visit review using our clinic review tool, if applicable. No additional management support is needed unless otherwise documented below in the visit note. 

## 2015-06-20 NOTE — Progress Notes (Signed)
Patient ID: Nancy [name], female    DOB: Jan 04, 1966  Age: 49 y.o. MRN: [id number]    Subjective:  Subjective HPI Nancy [name] presents for f/u er from headaches and dizziness.  The flourescent light seems to make everything worse.  She has seen the neurologist and she needs a letter to get a screen for the computer to lessen the brightness and if she wears a hat that helps too  She has a f/u with neuro.  No new complaints.   Overall she is better.    Review of Systems  Constitutional: Negative for diaphoresis, appetite change, fatigue and unexpected weight change.  Eyes: Negative for pain, redness and visual disturbance.  Respiratory: Negative for cough, chest tightness, shortness of breath and wheezing.   Cardiovascular: Negative for chest pain, palpitations and leg swelling.  Endocrine: Negative for cold intolerance, heat intolerance, polydipsia, polyphagia and polyuria.  Genitourinary: Negative for dysuria, frequency and difficulty urinating.  Neurological: Negative for dizziness, light-headedness, numbness and headaches.    History Past Medical History  Diagnosis Date  . Fibrocystic breast disease   . Atopic eczema   . Allergy     SEASONAL  . Anemia     HIGH SCHOOL  . GERD (gastroesophageal reflux disease)   . TIA (transient ischemic attack)     She has past surgical history that includes Myomectomy (2002).   Her family history includes Breast cancer in her paternal aunt; Diabetes in her father and mother; Gout in her mother; Hypertension in her father. There is no history of Thyroid disease.She reports that she has never smoked. She has never used smokeless tobacco. She reports that she drinks alcohol. She reports that she does not use illicit drugs.  Current Outpatient Prescriptions on File Prior to Visit  Medication Sig Dispense Refill  . cetirizine (ZYRTEC) 10 MG tablet Take 10 mg by mouth daily as needed. For allergies     . Cholecalciferol (VITAMIN D3)  5000 UNITS CAPS Take 1 capsule by mouth daily.    . meclizine (ANTIVERT) 50 MG tablet Take 1 tablet (50 mg total) by mouth 3 (three) times daily as needed. (Patient taking differently: Take 50 mg by mouth 3 (three) times daily as needed for dizziness. ) 30 tablet 0  . Prenatal Vit-Fe Fumarate-FA (PRENATAL MULTIVITAMIN) TABS tablet Take 2 tablets by mouth daily at 12 noon.    Marland Kitchen VITAMIN K PO Take 1 capsule by mouth daily.     No current facility-administered medications on file prior to visit.     Objective:  Objective Physical Exam  Constitutional: She is oriented to person, place, and time. She appears well-developed and well-nourished.  HENT:  Head: Normocephalic and atraumatic.  Eyes: Conjunctivae and EOM are normal.  Neck: Normal range of motion. Neck supple. No JVD present. Carotid bruit is not present. No thyromegaly present.  Cardiovascular: Normal rate, regular rhythm and normal heart sounds.   No murmur heard. Pulmonary/Chest: Effort normal and breath sounds normal. No respiratory distress. She has no wheezes. She has no rales. She exhibits no tenderness.  Musculoskeletal: She exhibits no edema.  Neurological: She is alert and oriented to person, place, and time.  Psychiatric: She has a normal mood and affect.  Nursing note and vitals reviewed.  BP 117/63 mmHg  Pulse 70  Temp(Src) 98.8 F (37.1 C) (Oral)  Wt 157 lb 12.8 oz (71.578 kg)  SpO2 98%  LMP 06/10/2015 Wt Readings from Last 3 Encounters:  06/20/15 157 lb 12.8 oz (71.578  kg)  06/16/15 156 lb (70.761 kg)  06/13/15 152 lb (68.947 kg)     Lab Results  Component Value Date   WBC 3.8* 06/13/2015   HGB 13.7 06/13/2015   HCT 41.4 06/13/2015   PLT 223 06/13/2015   GLUCOSE 101* 06/13/2015   CHOL 142 06/10/2015   TRIG 33 06/10/2015   HDL 52 06/10/2015   LDLCALC 83 06/10/2015   ALT 16 06/10/2015   AST 19 06/10/2015   NA 138 06/13/2015   K 3.8 06/13/2015   CL 105 06/13/2015   CREATININE 0.69 06/13/2015   BUN  8 06/13/2015   CO2 28 06/13/2015   TSH 3.727 06/10/2015   INR 1.09 06/09/2015   HGBA1C 5.7* 06/10/2015    No results found.   Assessment & Plan:  Plan I am having Nancy [name] maintain her cetirizine, meclizine, Vitamin D3, VITAMIN K PO, and prenatal multivitamin.  No orders of the defined types were placed in this encounter.    Problem List Items Addressed This Visit    Vestibular migraine - Primary    Per neuro Symptoms improving slowly Meclizine prn         Follow-up: Return if symptoms worsen or fail to improve.  Garnet Koyanagi, DO

## 2015-06-20 NOTE — Assessment & Plan Note (Signed)
Per neuro Symptoms improving slowly Meclizine prn

## 2015-06-20 NOTE — Patient Instructions (Signed)

## 2015-07-22 ENCOUNTER — Ambulatory Visit (INDEPENDENT_AMBULATORY_CARE_PROVIDER_SITE_OTHER): Payer: Federal, State, Local not specified - PPO | Admitting: Family Medicine

## 2015-07-22 ENCOUNTER — Encounter: Payer: Self-pay | Admitting: Family Medicine

## 2015-07-22 VITALS — BP 130/86 | HR 82 | Temp 98.2°F | Ht 66.0 in | Wt 158.0 lb

## 2015-07-22 DIAGNOSIS — G43109 Migraine with aura, not intractable, without status migrainosus: Secondary | ICD-10-CM

## 2015-07-22 DIAGNOSIS — G43809 Other migraine, not intractable, without status migrainosus: Secondary | ICD-10-CM

## 2015-07-22 NOTE — Progress Notes (Signed)
Pre visit review using our clinic review tool, if applicable. No additional management support is needed unless otherwise documented below in the visit note. 

## 2015-07-22 NOTE — Patient Instructions (Signed)
increased hydration, 64 ounces of clear fluids daily. Minimize alcohol and caffeine. Eat small frequent meals with lean proteins and complex carbs. Avoid high and low blood sugars. Get adequate sleep, 7-8 hours a night. Needs exercise daily preferably in the morning. Ibuprofen 200 mg tabs 2 tabs po prn Headache or Excedrine Migraine   Migraine Headache A migraine headache is an intense, throbbing pain on one or both sides of your head. A migraine can last for 30 minutes to several hours. CAUSES  The exact cause of a migraine headache is not always known. However, a migraine may be caused when nerves in the brain become irritated and release chemicals that cause inflammation. This causes pain. Certain things may also trigger migraines, such as:  Alcohol.  Smoking.  Stress.  Menstruation.  Aged cheeses.  Foods or drinks that contain nitrates, glutamate, aspartame, or tyramine.  Lack of sleep.  Chocolate.  Caffeine.  Hunger.  Physical exertion.  Fatigue.  Medicines used to treat chest pain (nitroglycerine), birth control pills, estrogen, and some blood pressure medicines. SIGNS AND SYMPTOMS  Pain on one or both sides of your head.  Pulsating or throbbing pain.  Severe pain that prevents daily activities.  Pain that is aggravated by any physical activity.  Nausea, vomiting, or both.  Dizziness.  Pain with exposure to bright lights, loud noises, or activity.  General sensitivity to bright lights, loud noises, or smells. Before you get a migraine, you may get warning signs that a migraine is coming (aura). An aura may include:  Seeing flashing lights.  Seeing bright spots, halos, or zigzag lines.  Having tunnel vision or blurred vision.  Having feelings of numbness or tingling.  Having trouble talking.  Having muscle weakness. DIAGNOSIS  A migraine headache is often diagnosed based on:  Symptoms.  Physical exam.  A CT scan or MRI of your head. These  imaging tests cannot diagnose migraines, but they can help rule out other causes of headaches. TREATMENT Medicines may be given for pain and nausea. Medicines can also be given to help prevent recurrent migraines.  HOME CARE INSTRUCTIONS  Only take over-the-counter or prescription medicines for pain or discomfort as directed by your health care provider. The use of long-term narcotics is not recommended.  Lie down in a dark, quiet room when you have a migraine.  Keep a journal to find out what may trigger your migraine headaches. For example, write down:  What you eat and drink.  How much sleep you get.  Any change to your diet or medicines.  Limit alcohol consumption.  Quit smoking if you smoke.  Get 7-9 hours of sleep, or as recommended by your health care provider.  Limit stress.  Keep lights dim if bright lights bother you and make your migraines worse. SEEK IMMEDIATE MEDICAL CARE IF:   Your migraine becomes severe.  You have a fever.  You have a stiff neck.  You have vision loss.  You have muscular weakness or loss of muscle control.  You start losing your balance or have trouble walking.  You feel faint or pass out.  You have severe symptoms that are different from your first symptoms. MAKE SURE YOU:   Understand these instructions.  Will watch your condition.  Will get help right away if you are not doing well or get worse.   This information is not intended to replace advice given to you by your health care provider. Make sure you discuss any questions you have with your  health care provider.   Document Released: 08/16/2005 Document Revised: 09/06/2014 Document Reviewed: 04/23/2013 Elsevier Interactive Patient Education [employer].

## 2015-07-27 NOTE — Assessment & Plan Note (Addendum)
Was struggling with a severe migraine earlier but it is greatly improved at visit. Was struggling with scotomata, photophobia and pain over left eye. Now her symptoms are nearly resolved. Has been following with neurology. May use NSAIDs prn, Encouraged increased hydration, 64 ounces of clear fluids daily. Minimize alcohol and caffeine. Eat small frequent meals with lean proteins and complex carbs. Avoid high and low blood sugars. Get adequate sleep, 7-8 hours a night. Needs exercise daily preferably in the morning.

## 2015-07-27 NOTE — Progress Notes (Signed)
Subjective:    Patient ID: Nancy [name], female    DOB: Feb 07, 1966, 49 y.o.   MRN: [id number]  Chief Complaint  Patient presents with  . Follow-up    HPI Patient is in today for evaluation of headaches. Should a very bad headache in the last 24 hours with left-sided facial pain, photophobia, double vision and scotomata. She has been evaluated by neurology and reassured that she is struggling with vestibular migraines which occasionally cause a sense of disequilibrium as well. At the time of the visit she is feeling significantly better. No recent illness fevers or chills. Denies CP/palp/SOB/congestion/fevers/GI or GU c/o. Taking meds as prescribed  Past Medical History  Diagnosis Date  . Fibrocystic breast disease   . Atopic eczema   . Allergy     SEASONAL  . Anemia     HIGH SCHOOL  . GERD (gastroesophageal reflux disease)   . TIA (transient ischemic attack)     Past Surgical History  Procedure Laterality Date  . Myomectomy  2002    Family History  Problem Relation Age of Onset  . Gout Mother   . Diabetes Mother   . Hypertension Father   . Diabetes Father   . Breast cancer Paternal Aunt   . Thyroid disease Neg Hx     Social History   Social History  . Marital Status: Married    Spouse Name: N/A  . Number of Children: 0  . Years of Education: 18   Occupational History  . [employer]  .     Social History Main Topics  . Smoking status: Never Smoker   . Smokeless tobacco: Never Used  . Alcohol Use: Yes     Comment: maybe 3 times a year  . Drug Use: No  . Sexual Activity:    Partners: Male   Other Topics Concern  . Not on file   Social History Narrative    Outpatient Prescriptions Prior to Visit  Medication Sig Dispense Refill  . cetirizine (ZYRTEC) 10 MG tablet Take 10 mg by mouth daily as needed. For allergies     . Cholecalciferol (VITAMIN D3) 5000 UNITS CAPS Take 1 capsule by mouth daily.    . meclizine (ANTIVERT) 50 MG tablet  Take 1 tablet (50 mg total) by mouth 3 (three) times daily as needed. (Patient taking differently: Take 50 mg by mouth 3 (three) times daily as needed for dizziness. ) 30 tablet 0  . Prenatal Vit-Fe Fumarate-FA (PRENATAL MULTIVITAMIN) TABS tablet Take 2 tablets by mouth daily at 12 noon.    Marland Kitchen VITAMIN K PO Take 1 capsule by mouth daily.     No facility-administered medications prior to visit.    Allergies  Allergen Reactions  . Penicillins Anaphylaxis    Has patient had a PCN reaction causing immediate rash, facial/tongue/throat swelling, SOB or lightheadedness with hypotensionYes Swelling in throat  Has patient had a PCN reaction causing severe rash involving mucus membranes or skin necrosis:/No Has patient had a PCN reaction that required hospitalization/No Has patient had a PCN reaction occurring within the last 10 years:NO If all of the above answers are "NO", then may proceed with Cephalosporin use.   . Lipitor [Atorvastatin] Palpitations    Tingling, flushing    Review of Systems  Constitutional: Negative for fever and malaise/fatigue.  HENT: Negative for congestion.   Eyes: Positive for double vision and photophobia. Negative for pain, discharge and redness.  Respiratory: Negative for shortness of breath.  Cardiovascular: Negative for chest pain, palpitations and leg swelling.  Gastrointestinal: Negative for nausea and abdominal pain.  Genitourinary: Negative for dysuria.  Musculoskeletal: Negative for falls.  Skin: Negative for rash.  Neurological: Positive for headaches. Negative for loss of consciousness.  Endo/Heme/Allergies: Negative for environmental allergies.  Psychiatric/Behavioral: Negative for depression. The patient is not nervous/anxious.        Objective:    Physical Exam  Constitutional: She is oriented to person, place, and time. She appears well-developed and well-nourished. No distress.  HENT:  Head: Normocephalic and atraumatic.  Nose: Nose normal.    Eyes: Right eye exhibits no discharge. Left eye exhibits no discharge.  Neck: Normal range of motion. Neck supple.  Cardiovascular: Normal rate and regular rhythm.   No murmur heard. Pulmonary/Chest: Effort normal and breath sounds normal.  Abdominal: Soft. Bowel sounds are normal. There is no tenderness.  Musculoskeletal: She exhibits no edema.  Neurological: She is alert and oriented to person, place, and time.  Skin: Skin is warm and dry.  Psychiatric: She has a normal mood and affect.  Nursing note and vitals reviewed.   BP 130/86 mmHg  Pulse 82  Temp(Src) 98.2 F (36.8 C) (Oral)  Ht '5\' 6"'$  (1.676 m)  Wt 158 lb (71.668 kg)  BMI 25.51 kg/m2  SpO2 97% Wt Readings from Last 3 Encounters:  07/22/15 158 lb (71.668 kg)  06/20/15 157 lb 12.8 oz (71.578 kg)  06/16/15 156 lb (70.761 kg)     Lab Results  Component Value Date   WBC 3.8* 06/13/2015   HGB 13.7 06/13/2015   HCT 41.4 06/13/2015   PLT 223 06/13/2015   GLUCOSE 101* 06/13/2015   CHOL 142 06/10/2015   TRIG 33 06/10/2015   HDL 52 06/10/2015   LDLCALC 83 06/10/2015   ALT 16 06/10/2015   AST 19 06/10/2015   NA 138 06/13/2015   K 3.8 06/13/2015   CL 105 06/13/2015   CREATININE 0.69 06/13/2015   BUN 8 06/13/2015   CO2 28 06/13/2015   TSH 3.727 06/10/2015   INR 1.09 06/09/2015   HGBA1C 5.7* 06/10/2015    Lab Results  Component Value Date   TSH 3.727 06/10/2015   Lab Results  Component Value Date   WBC 3.8* 06/13/2015   HGB 13.7 06/13/2015   HCT 41.4 06/13/2015   MCV 85.7 06/13/2015   PLT 223 06/13/2015   Lab Results  Component Value Date   NA 138 06/13/2015   K 3.8 06/13/2015   CO2 28 06/13/2015   GLUCOSE 101* 06/13/2015   BUN 8 06/13/2015   CREATININE 0.69 06/13/2015   BILITOT 0.6 06/10/2015   ALKPHOS 34* 06/10/2015   AST 19 06/10/2015   ALT 16 06/10/2015   PROT 6.2* 06/10/2015   ALBUMIN 3.6 06/10/2015   CALCIUM 9.5 06/13/2015   ANIONGAP 5 06/13/2015   GFR 120.86 02/17/2015   Lab Results   Component Value Date   CHOL 142 06/10/2015   Lab Results  Component Value Date   HDL 52 06/10/2015   Lab Results  Component Value Date   LDLCALC 83 06/10/2015   Lab Results  Component Value Date   TRIG 33 06/10/2015   Lab Results  Component Value Date   CHOLHDL 2.7 06/10/2015   Lab Results  Component Value Date   HGBA1C 5.7* 06/10/2015       Assessment & Plan:   Problem List Items Addressed This Visit    Vestibular migraine - Primary    Was struggling with  a severe migraine earlier but it is greatly improved at visit. Was struggling with scotomata, photophobia and pain over left eye. Now her symptoms are nearly resolved. Has been following with neurology. May use NSAIDs prn, Encouraged increased hydration, 64 ounces of clear fluids daily. Minimize alcohol and caffeine. Eat small frequent meals with lean proteins and complex carbs. Avoid high and low blood sugars. Get adequate sleep, 7-8 hours a night. Needs exercise daily preferably in the morning.         I am having Ms. Lyles maintain her cetirizine, meclizine, Vitamin D3, VITAMIN K PO, and prenatal multivitamin.  No orders of the defined types were placed in this encounter.     Penni Homans, MD

## 2015-08-21 ENCOUNTER — Encounter: Payer: Self-pay | Admitting: Family Medicine

## 2015-08-21 ENCOUNTER — Ambulatory Visit (INDEPENDENT_AMBULATORY_CARE_PROVIDER_SITE_OTHER): Payer: Federal, State, Local not specified - PPO | Admitting: Family Medicine

## 2015-08-21 VITALS — BP 118/80 | HR 68 | Temp 98.8°F | Wt 157.7 lb

## 2015-08-21 DIAGNOSIS — L309 Dermatitis, unspecified: Secondary | ICD-10-CM

## 2015-08-21 DIAGNOSIS — L218 Other seborrheic dermatitis: Secondary | ICD-10-CM | POA: Diagnosis not present

## 2015-08-21 DIAGNOSIS — L219 Seborrheic dermatitis, unspecified: Secondary | ICD-10-CM

## 2015-08-21 MED ORDER — MOMETASONE FUROATE 0.1 % EX SOLN: Freq: Every day | CUTANEOUS | Status: AC

## 2015-08-21 MED ORDER — CICLOPIROX 1 % EX SHAM: MEDICATED_SHAMPOO | CUTANEOUS | Status: DC

## 2015-08-21 NOTE — Progress Notes (Signed)
Pre visit review using our clinic review tool, if applicable. No additional management support is needed unless otherwise documented below in the visit note. 

## 2015-08-21 NOTE — Patient Instructions (Signed)
Seborrheic Dermatitis Seborrheic dermatitis involves pink or red skin with greasy, flaky scales. It usually occurs on the scalp, and it is often called dandruff. This condition may also affect the eyebrows, nose, ears, chest, and the bearded area of men's faces. It often occurs where skin has more oil (sebaceous) glands. It may come and go for no known reason, and it is often long-lasting (chronic). CAUSES The cause is not known. RISK FACTORS This condition is more like to develop in:  People who are stressed or tired.  People who have skin conditions, such as acne.  People who have certain conditions, such as:  HIV (human immunodeficiency virus).  AIDS (acquired immunodeficiency syndrome).  Parkinson disease.  An eating disorder.  Stroke.  Depression.  Epilepsy.  Alcoholism.  People who live in places that have extreme weather.  People who have a family history of seborrheic dermatitis.  People who use skin creams that are made with alcohol.  People who are 30-60 years old.  People who take certain medicines. SYMPTOMS Symptoms of this condition include:  Thick scales on the scalp.  Redness on the face or in the armpits.  Skin that is flaky. The flakes may be white or yellow.  Skin that seems oily or dry but is not helped with moisturizers.  Itching or burning in the affected areas. DIAGNOSIS This condition is diagnosed with a medical history and physical exam. A sample of your skin may be tested (skin biopsy). You may need to see a skin specialist (dermatologist). TREATMENT There is no cure for this condition, but treatment can help to manage the symptoms. Treatment may include:  Cortisone (steroid) ointments, creams, and lotions.  Over-the-counter or prescription shampoos. HOME CARE INSTRUCTIONS  Apply over-the-counter and prescription medicines only as told by your health care provider.  Keep all follow-up visits as told by your health care provider.  This is important.  Try to reduce your stress, such as with yoga or mediation. If you need help to reduce stress, ask your health care provider.  Shower or bathe as told by your health care provider.  Use any medicated shampoos as told by your health care provider. SEEK MEDICAL CARE IF:  Your symptoms do not improve with treatment.  Your symptoms get worse.  You have new symptoms.   This information is not intended to replace advice given to you by your health care provider. Make sure you discuss any questions you have with your health care provider.   Document Released: 08/16/2005 Document Revised: 05/07/2015 Document Reviewed: 01/01/2015 Elsevier Interactive Patient Education 2016 Elsevier Inc.  

## 2015-08-21 NOTE — Progress Notes (Signed)
Patient ID: Nancy [name], female    DOB: [birth year]  Age: 49 y.o. MRN: [id number]    Subjective:  Subjective HPI Nancy [name] presents for dry scaly scalp.  otc not effective   Review of Systems  Constitutional: Negative for diaphoresis, appetite change, fatigue and unexpected weight change.  Eyes: Negative for pain, redness and visual disturbance.  Respiratory: Negative for cough, chest tightness, shortness of breath and wheezing.   Cardiovascular: Negative for chest pain, palpitations and leg swelling.  Endocrine: Negative for cold intolerance, heat intolerance, polydipsia, polyphagia and polyuria.  Genitourinary: Negative for dysuria, frequency and difficulty urinating.  Neurological: Negative for dizziness, light-headedness, numbness and headaches.    History Past Medical History  Diagnosis Date  . Fibrocystic breast disease   . Atopic eczema   . Allergy     SEASONAL  . Anemia     HIGH SCHOOL  . GERD (gastroesophageal reflux disease)   . TIA (transient ischemic attack)     She has past surgical history that includes Myomectomy (2002).   Her family history includes Breast cancer in her paternal aunt; Diabetes in her father and mother; Gout in her mother; Hypertension in her father. There is no history of Thyroid disease.She reports that she has never smoked. She has never used smokeless tobacco. She reports that she drinks alcohol. She reports that she does not use illicit drugs.  Current Outpatient Prescriptions on File Prior to Visit  Medication Sig Dispense Refill  . cetirizine (ZYRTEC) 10 MG tablet Take 10 mg by mouth daily as needed. For allergies     . Cholecalciferol (VITAMIN D3) 5000 UNITS CAPS Take 1 capsule by mouth daily.    . meclizine (ANTIVERT) 50 MG tablet Take 1 tablet (50 mg total) by mouth 3 (three) times daily as needed. (Patient taking differently: Take 50 mg by mouth 3 (three) times daily as needed for dizziness. ) 30 tablet 0  . Prenatal  Vit-Fe Fumarate-FA (PRENATAL MULTIVITAMIN) TABS tablet Take 2 tablets by mouth daily at 12 noon.    Marland Kitchen VITAMIN K PO Take 1 capsule by mouth daily.     No current facility-administered medications on file prior to visit.     Objective:  Objective Physical Exam  Constitutional: She appears well-developed and well-nourished.  Skin: Skin is intact. She is not diaphoretic. No pallor.     Psychiatric: She has a normal mood and affect. Her behavior is normal. Judgment and thought content normal.  Nursing note and vitals reviewed.  BP 118/80 mmHg  Pulse 68  Temp(Src) 98.8 F (37.1 C) (Oral)  Wt 157 lb 11.2 oz (71.532 kg)  SpO2 98% Wt Readings from Last 3 Encounters:  08/21/15 157 lb 11.2 oz (71.532 kg)  07/22/15 158 lb (71.668 kg)  06/20/15 157 lb 12.8 oz (71.578 kg)     Lab Results  Component Value Date   WBC 3.8* 06/13/2015   HGB 13.7 06/13/2015   HCT 41.4 06/13/2015   PLT 223 06/13/2015   GLUCOSE 101* 06/13/2015   CHOL 142 06/10/2015   TRIG 33 06/10/2015   HDL 52 06/10/2015   LDLCALC 83 06/10/2015   ALT 16 06/10/2015   AST 19 06/10/2015   NA 138 06/13/2015   K 3.8 06/13/2015   CL 105 06/13/2015   CREATININE 0.69 06/13/2015   BUN 8 06/13/2015   CO2 28 06/13/2015   TSH 3.727 06/10/2015   INR 1.09 06/09/2015   HGBA1C 5.7* 06/10/2015    No results found.  Assessment & Plan:  Plan I am having Ms. Tewell start on Ciclopirox and mometasone. I am also having her maintain her cetirizine, meclizine, Vitamin D3, VITAMIN K PO, prenatal multivitamin, and aspirin.  Meds ordered this encounter  Medications  . aspirin 81 MG tablet    Sig: Take 81 mg by mouth daily.  . Ciclopirox 1 % shampoo    Sig: Use as directed 2x a week for up to 4 weeks    Dispense:  120 mL    Refill:  1  . mometasone (ELOCON) 0.1 % lotion    Sig: Apply topically daily.    Dispense:  60 mL    Refill:  0    Problem List Items Addressed This Visit    None    Visit Diagnoses    Seborrheic  dermatitis of scalp    -  Primary    Relevant Medications    Ciclopirox 1 % shampoo    Eczema        Relevant Medications    mometasone (ELOCON) 0.1 % lotion       Follow-up: Return if symptoms worsen or fail to improve.  Garnet Koyanagi, DO

## 2015-08-29 ENCOUNTER — Telehealth: Payer: Self-pay | Admitting: Family Medicine

## 2015-08-29 DIAGNOSIS — H8113 Benign paroxysmal vertigo, bilateral: Secondary | ICD-10-CM

## 2015-08-29 MED ORDER — MECLIZINE HCL 50 MG PO TABS: 50.0000 mg | ORAL_TABLET | Freq: Three times a day (TID) | ORAL | Status: DC | PRN

## 2015-08-29 NOTE — Telephone Encounter (Signed)
Rx refilled #30 with 0 refills. Pt has apt for Sep 30, 2015.

## 2015-08-29 NOTE — Telephone Encounter (Signed)
Last seen 08/21/15 and filled 09/23/14 #30   Please advise     KP

## 2015-08-29 NOTE — Telephone Encounter (Signed)
Refill x1 

## 2015-08-29 NOTE — Telephone Encounter (Signed)
Relation to MM:[facility] Call back [place]: [place], [address] [place] [phone] (Phone) [phone] (Fax)         Reason for call:  Patient requesting a refill meclizine (ANTIVERT) 50 MG tablet.

## 2015-09-08 ENCOUNTER — Telehealth: Payer: Self-pay | Admitting: Family Medicine

## 2015-09-08 ENCOUNTER — Other Ambulatory Visit: Payer: Self-pay | Admitting: Family Medicine

## 2015-09-08 DIAGNOSIS — L219 Seborrheic dermatitis, unspecified: Secondary | ICD-10-CM

## 2015-09-08 NOTE — Telephone Encounter (Signed)
Caller name: Self   Can be reached: [phone]   Reason for call: Requesting a referral to a dermatologist. States Dr. Etter Sjogren advised her that this would be the next step.

## 2015-09-08 NOTE — Telephone Encounter (Signed)
Please advise      KP 

## 2015-09-08 NOTE — Telephone Encounter (Signed)
Referral in

## 2015-09-12 ENCOUNTER — Ambulatory Visit: Payer: Federal, State, Local not specified - PPO | Admitting: Family Medicine

## 2015-09-28 ENCOUNTER — Encounter (HOSPITAL_BASED_OUTPATIENT_CLINIC_OR_DEPARTMENT_OTHER): Payer: Self-pay | Admitting: Emergency Medicine

## 2015-09-28 ENCOUNTER — Emergency Department (HOSPITAL_BASED_OUTPATIENT_CLINIC_OR_DEPARTMENT_OTHER)
Admission: EM | Admit: 2015-09-28 | Discharge: 2015-09-28 | Disposition: A | Discharge status: Home / Self Care | Payer: Federal, State, Local not specified - PPO | Attending: Emergency Medicine | Admitting: Emergency Medicine

## 2015-09-28 ENCOUNTER — Emergency Department (HOSPITAL_BASED_OUTPATIENT_CLINIC_OR_DEPARTMENT_OTHER): Payer: Federal, State, Local not specified - PPO

## 2015-09-28 DIAGNOSIS — R69 Illness, unspecified: Secondary | ICD-10-CM

## 2015-09-28 DIAGNOSIS — Z880 Allergy status to penicillin: Secondary | ICD-10-CM | POA: Diagnosis not present

## 2015-09-28 DIAGNOSIS — Z8673 Personal history of transient ischemic attack (TIA), and cerebral infarction without residual deficits: Secondary | ICD-10-CM | POA: Diagnosis not present

## 2015-09-28 DIAGNOSIS — Z7982 Long term (current) use of aspirin: Secondary | ICD-10-CM | POA: Diagnosis not present

## 2015-09-28 DIAGNOSIS — Z79899 Other long term (current) drug therapy: Secondary | ICD-10-CM | POA: Diagnosis not present

## 2015-09-28 DIAGNOSIS — Z8719 Personal history of other diseases of the digestive system: Secondary | ICD-10-CM | POA: Insufficient documentation

## 2015-09-28 DIAGNOSIS — Z8742 Personal history of other diseases of the female genital tract: Secondary | ICD-10-CM | POA: Insufficient documentation

## 2015-09-28 DIAGNOSIS — Z872 Personal history of diseases of the skin and subcutaneous tissue: Secondary | ICD-10-CM | POA: Insufficient documentation

## 2015-09-28 DIAGNOSIS — Z7951 Long term (current) use of inhaled steroids: Secondary | ICD-10-CM | POA: Diagnosis not present

## 2015-09-28 DIAGNOSIS — Z8701 Personal history of pneumonia (recurrent): Secondary | ICD-10-CM | POA: Diagnosis not present

## 2015-09-28 DIAGNOSIS — J111 Influenza due to unidentified influenza virus with other respiratory manifestations: Secondary | ICD-10-CM | POA: Diagnosis not present

## 2015-09-28 DIAGNOSIS — R000 Tachycardia, unspecified: Secondary | ICD-10-CM | POA: Diagnosis not present

## 2015-09-28 DIAGNOSIS — Z862 Personal history of diseases of the blood and blood-forming organs and certain disorders involving the immune mechanism: Secondary | ICD-10-CM | POA: Insufficient documentation

## 2015-09-28 DIAGNOSIS — R05 Cough: Secondary | ICD-10-CM | POA: Diagnosis present

## 2015-09-28 MED ORDER — ACETAMINOPHEN 500 MG PO TABS
1000.0000 mg | ORAL_TABLET | Freq: Four times a day (QID) | ORAL | Status: DC | PRN
  Administered 2015-09-28: 1000 mg via ORAL
  Filled 2015-09-28: qty 2

## 2015-09-28 MED ORDER — HYDROCODONE-ACETAMINOPHEN 5-325 MG PO TABS
1.0000 | ORAL_TABLET | Freq: Once | ORAL | Status: AC
  Administered 2015-09-28: 1 via ORAL
  Filled 2015-09-28: qty 1

## 2015-09-28 MED ORDER — BENZONATATE 100 MG PO CAPS: 100.0000 mg | ORAL_CAPSULE | Freq: Three times a day (TID) | ORAL | Status: DC

## 2015-09-28 MED ORDER — HYDROCOD POLST-CPM POLST ER 10-8 MG/5ML PO SUER: 5.0000 mL | Freq: Two times a day (BID) | ORAL | Status: DC | PRN

## 2015-09-28 MED ORDER — ONDANSETRON 4 MG PO TBDP
4.0000 mg | ORAL_TABLET | Freq: Once | ORAL | Status: AC
  Administered 2015-09-28: 4 mg via ORAL
  Filled 2015-09-28: qty 1

## 2015-09-28 MED ORDER — ONDANSETRON HCL 4 MG PO TABS: 4.0000 mg | ORAL_TABLET | Freq: Four times a day (QID) | ORAL | Status: DC

## 2015-09-28 MED ORDER — IBUPROFEN 400 MG PO TABS
400.0000 mg | ORAL_TABLET | Freq: Once | ORAL | Status: AC
  Administered 2015-09-28: 400 mg via ORAL
  Filled 2015-09-28: qty 1

## 2015-09-28 NOTE — Discharge Instructions (Signed)
Her symptoms are likely due to influenza. Please drink plenty of fluids as we discussed. He may take your Tussionex at night for your cough and chest discomfort. Tessalon for cough without pain medicine. Your Zofran for nausea. follow up with your doctor as needed. Return to ED for new or worsening symptoms  Influenza, Adult Influenza ("the flu") is a viral infection of the respiratory tract. It occurs more often in winter months because people spend more time in close contact with one another. Influenza can make you feel very sick. Influenza easily spreads from person to person (contagious). CAUSES  Influenza is caused by a virus that infects the respiratory tract. You can catch the virus by breathing in droplets from an infected person's cough or sneeze. You can also catch the virus by touching something that was recently contaminated with the virus and then touching your mouth, nose, or eyes. RISKS AND COMPLICATIONS You may be at risk for a more severe case of influenza if you smoke cigarettes, have diabetes, have chronic heart disease (such as heart failure) or lung disease (such as asthma), or if you have a weakened immune system. Elderly people and pregnant women are also at risk for more serious infections. The most common problem of influenza is a lung infection (pneumonia). Sometimes, this problem can require emergency medical care and may be life threatening. SIGNS AND SYMPTOMS  Symptoms typically last 4 to 10 days and may include:  Fever.  Chills.  Headache, body aches, and muscle aches.  Sore throat.  Chest discomfort and cough.  Poor appetite.  Weakness or feeling tired.  Dizziness.  Nausea or vomiting. DIAGNOSIS  Diagnosis of influenza is often made based on your history and a physical exam. A nose or throat swab test can be done to confirm the diagnosis. TREATMENT  In mild cases, influenza goes away on its own. Treatment is directed at relieving symptoms. For more severe  cases, your health care provider may prescribe antiviral medicines to shorten the sickness. Antibiotic medicines are not effective because the infection is caused by a virus, not by bacteria. HOME CARE INSTRUCTIONS  Take medicines only as directed by your health care provider.  Use a cool mist humidifier to make breathing easier.  Get plenty of rest until your temperature returns to normal. This usually takes 3 to 4 days.  Drink enough fluid to keep your urine clear or pale yellow.  Cover yourmouth and nosewhen coughing or sneezing,and wash your handswellto prevent thevirusfrom spreading.  Stay homefromwork orschool untilthe fever is gonefor at least 11fll day. PREVENTION  An annual influenza vaccination (flu shot) is the best way to avoid getting influenza. An annual flu shot is now routinely recommended for all adults in the [place]:  You experiencechest pain, yourcough worsens,or you producemore mucus.  Youhave nausea,vomiting, ordiarrhea.  Your fever returns or gets worse. SEEK IMMEDIATE MEDICAL CARE IF:  You havetrouble breathing, you become short of breath,or your skin ornails becomebluish.  You have severe painor stiffnessin the neck.  You develop a sudden headache, or pain in the face or ear.  You have nausea or vomiting that you cannot control. MAKE SURE YOU:   Understand these instructions.  Will watch your condition.  Will get help right away if you are not doing well or get worse.   This information is not intended to replace advice given to you by your health care provider. Make sure you discuss any questions you have with your health care  provider.   Document Released: 08/13/2000 Document Revised: 09/06/2014 Document Reviewed: 11/15/2011 Elsevier Interactive Patient Education [employer].

## 2015-09-28 NOTE — ED Notes (Signed)
Pt c/o cough started yesterday.  Today cough is worse and accompanied by headache and sensitivity to light.  Fever or 102F at home.  Pt took robitussen DM at home for cough, which provided initial relief but cough returns.

## 2015-09-28 NOTE — ED Notes (Addendum)
Cough onset yesterday. Easily provoked non-productive cough, weak and ineffective cough, tachypneic. resps shallow. Alert, NAD, calm, interactive, speech clear. States, "feel like past PNA". Also mentions sob, HA, body aches, fever, and diarrhea (watery, no blood), diarrhea onset today x2 episodes. Also intermitant nausea (denies: vomiting or dizziness). Has been taking Tussin DM (none tonight).

## 2015-09-28 NOTE — ED Notes (Signed)
Up to b/r by w/c with family. Xray results reviewed.

## 2015-09-28 NOTE — ED Provider Notes (Signed)
CSN: [id number]     Arrival date & time 09/28/15  1927 History   First MD Initiated Contact with Patient 09/28/15 2016     Chief Complaint  Patient presents with  . Cough     (Consider location/radiation/quality/duration/timing/severity/associated sxs/prior Treatment) HPI Odean Mcelwain is a 50 y.o. female who comes in for evaluation of cough. Patient reports yesterday she began to develop a dry cough that has progressively worsened. She has tried OTC cough medicines without relief. She has since developed headaches, muscle aches, hot and cold flashes and 3 loose stools today. States it feels like "when I had pneumonia". States she did not get the flu shot this year. Denies any chest pain, overt shortness of breath, history of blood clot, leg swelling, personal history of cancer. No other modifying factors.  Past Medical History  Diagnosis Date  . Fibrocystic breast disease   . Atopic eczema   . Allergy     SEASONAL  . Anemia     HIGH S[employer]OL  . GERD (gastroesophageal reflux disease)   . TIA (transient ischemic attack)    Past Surgical History  Procedure Laterality Date  . Myomectomy  2002   Family History  Problem Relation Age of Onset  . Gout Mother   . Diabetes Mother   . Hypertension Father   . Diabetes Father   . Breast cancer Paternal Aunt   . Thyroid disease Neg Hx    Social History  Substance Use Topics  . Smoking status: Never Smoker   . Smokeless tobacco: Never Used  . Alcohol Use: Yes     Comment: maybe 3 times a year   OB History    No data available     Review of Systems A 10 point review of systems was completed and was negative except for pertinent positives and negatives as mentioned in the history of present illness     Allergies  Penicillins and Lipitor  Home Medications   Prior to Admission medications   Medication Sig Start Date End Date Taking? Authorizing Provider  aspirin 81 MG tablet Take 81 mg by mouth daily.    Historical  Provider, MD  benzonatate (TESSALON) 100 MG capsule Take 1 capsule (100 mg total) by mouth every 8 (eight) hours. 09/28/15   Comer Locket, PA-C  cetirizine (ZYRTEC) 10 MG tablet Take 10 mg by mouth daily as needed. For allergies     Historical Provider, MD  chlorpheniramine-HYDROcodone (TUSSIONEX PENNKINETIC ER) 10-8 MG/5ML SUER Take 5 mLs by mouth every 12 (twelve) hours as needed for cough. 09/28/15   Comer Locket, PA-C  Cholecalciferol (VITAMIN D3) 5000 UNITS CAPS Take 1 capsule by mouth daily.    Historical Provider, MD  Ciclopirox 1 % shampoo Use as directed 2x a week for up to 4 weeks 08/21/15   Rosalita Chessman, DO  meclizine (ANTIVERT) 50 MG tablet Take 1 tablet (50 mg total) by mouth 3 (three) times daily as needed. 08/29/15   Alferd Apa Lowne, DO  mometasone (ELOCON) 0.1 % lotion Apply topically daily. 08/21/15   Alferd Apa Lowne, DO  ondansetron (ZOFRAN) 4 MG tablet Take 1 tablet (4 mg total) by mouth every 6 (six) hours. 09/28/15   Comer Locket, PA-C  Prenatal Vit-Fe Fumarate-FA (PRENATAL MULTIVITAMIN) TABS tablet Take 2 tablets by mouth daily at 12 noon.    Historical Provider, MD  VITAMIN K PO Take 1 capsule by mouth daily.    Historical Provider, MD   BP 120/76 mmHg  Pulse 102  Temp(Src) 99.6 F (37.6 C) (Oral)  Resp 20  Ht '5\' 6"'$  (1.676 m)  Wt 68.04 kg  BMI 24.22 kg/m2  SpO2 97%  LMP 08/17/2015 (Approximate) Physical Exam  Constitutional: She is oriented to person, place, and time. She appears well-developed and well-nourished.  Mildly uncomfortable  HENT:  Head: Normocephalic and atraumatic.  Mouth/Throat: Oropharynx is clear and moist.  Eyes: Conjunctivae are normal. Pupils are equal, round, and reactive to light. Right eye exhibits no discharge. Left eye exhibits no discharge. No scleral icterus.  Neck: Normal range of motion. Neck supple.  No meningismus  Cardiovascular: Normal rate, regular rhythm and normal heart sounds.   Pulmonary/Chest: Effort normal and  breath sounds normal. No respiratory distress. She has no wheezes. She has no rales.  Abdominal: Soft. There is no tenderness.  Musculoskeletal: Normal range of motion. She exhibits no edema or tenderness.  Neurological: She is alert and oriented to person, place, and time.  Cranial Nerves II-XII grossly intact  Skin: Skin is warm and dry. No rash noted.  Psychiatric: She has a normal mood and affect.  Nursing note and vitals reviewed.   ED Course  Procedures (including critical care time) Labs Review Labs Reviewed - No data to display  Imaging Review Dg Chest 2 View  09/28/2015  CLINICAL DATA:  Cough.  Headache.  Photophobia.  Fever. EXAM: CHEST  2 VIEW COMPARISON:  12/30/2010 FINDINGS: The heart size and mediastinal contours are within normal limits. Both lungs are clear. The visualized skeletal structures are unremarkable. IMPRESSION: No active cardiopulmonary disease. Electronically Signed   By: Van Clines M.D.   On: 09/28/2015 20:22   I have personally reviewed and evaluated these images and lab results as part of my medical decision-making.   EKG Interpretation None     Meds given in ED:  Medications  acetaminophen (TYLENOL) tablet 1,000 mg (1,000 mg Oral Given 09/28/15 1942)  ibuprofen (ADVIL,MOTRIN) tablet 400 mg (400 mg Oral Given 09/28/15 2021)  ondansetron (ZOFRAN-ODT) disintegrating tablet 4 mg (4 mg Oral Given 09/28/15 2021)  HYDROcodone-acetaminophen (NORCO/VICODIN) 5-325 MG per tablet 1 tablet (1 tablet Oral Given 09/28/15 2115)    Discharge Medication List as of 09/28/2015  9:55 PM    START taking these medications   Details  benzonatate (TESSALON) 100 MG capsule Take 1 capsule (100 mg total) by mouth every 8 (eight) hours., Starting 09/28/2015, Until Discontinued, Print    chlorpheniramine-HYDROcodone (TUSSIONEX PENNKINETIC ER) 10-8 MG/5ML SUER Take 5 mLs by mouth every 12 (twelve) hours as needed for cough., Starting 09/28/2015, Until Discontinued, Print     ondansetron (ZOFRAN) 4 MG tablet Take 1 tablet (4 mg total) by mouth every 6 (six) hours., Starting 09/28/2015, Until Discontinued, Print       Filed Vitals:   09/28/15 2130 09/28/15 2204 09/28/15 2206 09/28/15 2213  BP: 120/76 120/76    Pulse: 105 99 102   Temp:    99.6 F (37.6 C)  TempSrc:    Oral  Resp:  20    Height:      Weight:      SpO2: 96% 96% 97%     MDM  Tionne Carelli is a 50 y.o. female here for evaluation of URI-like symptoms. On arrival she is febrile to 101.6 and tachycardic to 122. She refuses IV. Her exam is grossly unremarkable, chest x-ray shows no acute cardiopulmonary pathology. Symptoms are consistent with influenza-like illness. Low suspicion for pulmonary embolus without hypoxia and low wells score. She received Tylenol and  Motrin for her fever, which she has responded to well. Temp 99.6 on discharge. Tachycardia has improved with oral fluids. Discussed cost versus benefit of Tamiflu, patient declines. Discussed further systematic support at home with NSAIDs and OTC medicines. Patient verbalizes understanding and agrees with this plan. We'll be able to follow up with PCP next week as needed for reevaluation. Strict return precautions given. Appropriate for discharge. Discharged in the care of her husband. Prior to patient discharge, I discussed and reviewed this case with Dr.Linker  Final diagnoses:  Influenza-like illness        Comer Locket, PA-C 09/28/15 [place], MD 09/28/15 2248

## 2015-09-30 ENCOUNTER — Encounter: Payer: Self-pay | Admitting: Family Medicine

## 2015-09-30 ENCOUNTER — Ambulatory Visit: Payer: Federal, State, Local not specified - PPO | Admitting: Neurology

## 2015-09-30 ENCOUNTER — Ambulatory Visit (INDEPENDENT_AMBULATORY_CARE_PROVIDER_SITE_OTHER): Payer: Federal, State, Local not specified - PPO | Admitting: Family Medicine

## 2015-09-30 VITALS — BP 118/74 | HR 74 | Temp 99.7°F | Wt 157.6 lb

## 2015-09-30 DIAGNOSIS — J111 Influenza due to unidentified influenza virus with other respiratory manifestations: Secondary | ICD-10-CM | POA: Diagnosis not present

## 2015-09-30 DIAGNOSIS — J209 Acute bronchitis, unspecified: Secondary | ICD-10-CM

## 2015-09-30 DIAGNOSIS — R52 Pain, unspecified: Secondary | ICD-10-CM | POA: Diagnosis not present

## 2015-09-30 LAB — POCT INFLUENZA A/B
Influenza A, POC: NEGATIVE
Influenza B, POC: NEGATIVE

## 2015-09-30 MED ORDER — OSELTAMIVIR PHOSPHATE 75 MG PO CAPS: 75.0000 mg | ORAL_CAPSULE | Freq: Two times a day (BID) | ORAL | Status: DC

## 2015-09-30 MED ORDER — AZITHROMYCIN 250 MG PO TABS: ORAL_TABLET | ORAL | Status: DC

## 2015-09-30 NOTE — Patient Instructions (Signed)

## 2015-09-30 NOTE — Progress Notes (Signed)
Pre visit review using our clinic review tool, if applicable. No additional management support is needed unless otherwise documented below in the visit note. 

## 2015-10-01 ENCOUNTER — Telehealth: Payer: Self-pay | Admitting: Family Medicine

## 2015-10-01 DIAGNOSIS — J209 Acute bronchitis, unspecified: Secondary | ICD-10-CM | POA: Insufficient documentation

## 2015-10-01 NOTE — Assessment & Plan Note (Signed)
con't cough meds z pack

## 2015-10-01 NOTE — Progress Notes (Signed)
Patient ID: Nancy [name], female    DOB: [birth year]  Age: 50 y.o. MRN: [id number]    Subjective:  Subjective HPI Nancy [name] Patient presents for c/o fevers , bodyaches  And cough/ congestion. She went to er and was told she had the flu but was given cough med only.  Pt is feeling no better.  Symptoms started Sunday.    Review of Systems  Constitutional: Positive for fever and chills.  HENT: Positive for congestion, postnasal drip and rhinorrhea. Negative for sinus pressure.   Respiratory: Positive for cough. Negative for chest tightness, shortness of breath and wheezing.   Cardiovascular: Negative for chest pain, palpitations and leg swelling.  Allergic/Immunologic: Negative for environmental allergies.    History Past Medical History  Diagnosis Date  . Fibrocystic breast disease   . Atopic eczema   . Allergy     SEASONAL  . Anemia     HIGH SCHOOL  . GERD (gastroesophageal reflux disease)   . TIA (transient ischemic attack)     She has past surgical history that includes Myomectomy (2002).   Her family history includes Breast cancer in her paternal aunt; Diabetes in her father and mother; Gout in her mother; Hypertension in her father. There is no history of Thyroid disease.She reports that she has never smoked. She has never used smokeless tobacco. She reports that she drinks alcohol. She reports that she does not use illicit drugs.  Current Outpatient Prescriptions on File Prior to Visit  Medication Sig Dispense Refill  . aspirin 81 MG tablet Take 81 mg by mouth daily.    . benzonatate (TESSALON) 100 MG capsule Take 1 capsule (100 mg total) by mouth every 8 (eight) hours. 21 capsule 0  . cetirizine (ZYRTEC) 10 MG tablet Take 10 mg by mouth daily as needed. For allergies     . chlorpheniramine-HYDROcodone (TUSSIONEX PENNKINETIC ER) 10-8 MG/5ML SUER Take 5 mLs by mouth every 12 (twelve) hours as needed for cough. 140 mL 0  . Cholecalciferol (VITAMIN D3) 5000 UNITS CAPS Take  1 capsule by mouth daily.    . Ciclopirox 1 % shampoo Use as directed 2x a week for up to 4 weeks 120 mL 1  . meclizine (ANTIVERT) 50 MG tablet Take 1 tablet (50 mg total) by mouth 3 (three) times daily as needed. 30 tablet 0  . mometasone (ELOCON) 0.1 % lotion Apply topically daily. 60 mL 0  . ondansetron (ZOFRAN) 4 MG tablet Take 1 tablet (4 mg total) by mouth every 6 (six) hours. 12 tablet 0  . Prenatal Vit-Fe Fumarate-FA (PRENATAL MULTIVITAMIN) TABS tablet Take 2 tablets by mouth daily at 12 noon.    Marland Kitchen VITAMIN K PO Take 1 capsule by mouth daily.     No current facility-administered medications on file prior to visit.     Objective:  Objective Physical Exam  Constitutional: She is oriented to person, place, and time. She appears well-developed and well-nourished.  HENT:  Right Ear: External ear normal.  Left Ear: External ear normal.  + PND + errythema  Eyes: Conjunctivae are normal. Right eye exhibits no discharge. Left eye exhibits no discharge.  Cardiovascular: Normal rate, regular rhythm and normal heart sounds.   No murmur heard. Pulmonary/Chest: Effort normal. No respiratory distress. She has no wheezes. She has rhonchi. She has no rales. She exhibits no tenderness.  Musculoskeletal: She exhibits no edema.  Lymphadenopathy:    She has cervical adenopathy.  Neurological: She is alert and oriented to person, place, and time.  Nursing note and vitals reviewed.  BP 118/74 mmHg  Pulse 74  Temp(Src) 99.7 F (37.6 C) (Oral)  Wt 157 lb 9.6 oz (71.487 kg)  SpO2 98%  LMP 08/17/2015 (Approximate) Wt Readings from Last 3 Encounters:  09/30/15 157 lb 9.6 oz (71.487 kg)  09/28/15 150 lb (68.04 kg)  08/21/15 157 lb 11.2 oz (71.532 kg)   Flu test was neg-- but clinically flu  Lab Results  Component Value Date   WBC 3.8* 06/13/2015   HGB 13.7 06/13/2015   HCT 41.4 06/13/2015   PLT 223 06/13/2015   GLUCOSE 101* 06/13/2015   CHOL 142 06/10/2015   TRIG 33 06/10/2015   HDL 52  06/10/2015   LDLCALC 83 06/10/2015   ALT 16 06/10/2015   AST 19 06/10/2015   NA 138 06/13/2015   K 3.8 06/13/2015   CL 105 06/13/2015   CREATININE 0.69 06/13/2015   BUN 8 06/13/2015   CO2 28 06/13/2015   TSH 3.727 06/10/2015   INR 1.09 06/09/2015   HGBA1C 5.7* 06/10/2015    Dg Chest 2 View  09/28/2015  CLINICAL DATA:  Cough.  Headache.  Photophobia.  Fever. EXAM: CHEST  2 VIEW COMPARISON:  12/30/2010 FINDINGS: The heart size and mediastinal contours are within normal limits. Both lungs are clear. The visualized skeletal structures are unremarkable. IMPRESSION: No active cardiopulmonary disease. Electronically Signed   By: Van Clines M.D.   On: 09/28/2015 20:22     Assessment & Plan:  Plan I am having Nancy [name] start on oseltamivir and azithromycin. I am also having her maintain her cetirizine, Vitamin D3, VITAMIN K PO, prenatal multivitamin, aspirin, Ciclopirox, mometasone, meclizine, chlorpheniramine-HYDROcodone, benzonatate, and ondansetron.  Meds ordered this encounter  Medications  . oseltamivir (TAMIFLU) 75 MG capsule    Sig: Take 1 capsule (75 mg total) by mouth 2 (two) times daily.    Dispense:  10 capsule    Refill:  0  . azithromycin (ZITHROMAX Z-PAK) 250 MG tablet    Sig: As directed    Dispense:  6 each    Refill:  0    Problem List Items Addressed This Visit    None    Visit Diagnoses    Influenza with respiratory manifestation    -  Primary    Relevant Medications    oseltamivir (TAMIFLU) 75 MG capsule    Body aches        Relevant Orders    POCT Influenza A/B (Completed)    Acute bronchitis, unspecified organism        Relevant Medications    azithromycin (ZITHROMAX Z-PAK) 250 MG tablet       Follow-up: Return if symptoms worsen or fail to improve.  Garnet Koyanagi, DO

## 2015-10-01 NOTE — Telephone Encounter (Signed)
Caller name: Lititia   Relationship to patient: Self   Can be reached: [phone]  Pharmacy: [id number]- [place], [place] Reason for call: pt says that she was told at her visit that her husband could take her medication also due to his cough symptoms (oseltamivir) Pt says that she doesn't have enough for him and herself and would like to know if PCP could call in more for them so that they will be able to share.    Please advise

## 2015-10-02 NOTE — Telephone Encounter (Signed)
VM left advising the patient the Rx was already faxed and to follow up with the pharmacy.      KP

## 2015-10-02 NOTE — Telephone Encounter (Signed)
I sent a rx for him when she was here

## 2015-10-02 NOTE — Telephone Encounter (Signed)
Please advise      KP 

## 2015-12-24 ENCOUNTER — Encounter: Payer: Self-pay | Admitting: Neurology

## 2015-12-24 ENCOUNTER — Ambulatory Visit (INDEPENDENT_AMBULATORY_CARE_PROVIDER_SITE_OTHER): Payer: Federal, State, Local not specified - PPO | Admitting: Neurology

## 2015-12-24 VITALS — BP 128/80 | HR 68 | Ht 66.0 in | Wt 156.2 lb

## 2015-12-24 DIAGNOSIS — G43809 Other migraine, not intractable, without status migrainosus: Secondary | ICD-10-CM

## 2015-12-24 DIAGNOSIS — G43109 Migraine with aura, not intractable, without status migrainosus: Secondary | ICD-10-CM

## 2015-12-24 NOTE — Patient Instructions (Signed)
1.  If headaches with vertigo persists, I can prescribe you a steroid (prednisone) taper.  Hopefully, it will continue to improve 2.  Otherwise, follow up in 6 months.

## 2015-12-24 NOTE — Progress Notes (Signed)
NEUROLOGY FOLLOW UP OFFICE NOTE  Nancy [name] [id number]  HISTORY OF PRESENT ILLNESS: Nancy [name] is a 50 year old right-handed woman with past history of vertigo, who follows up for possible vestibular migraine.    UPDATE: Overall, she is doing well.  In November, she began experiencing some migraines.  She got anti-glare screen for her computer and it resolved.  In March, she went hiking.  When she got to higher altitudes, a migraine was triggered.  It resolved when she got back down the mountain.  She had been doing fine but started to feel mild left sided headache again, from the eye to back of the head, associated with vertigo.  She has been taking meclizine and Zyrtec.    HISTORY: On 06/09/15, she had two 15 minute episodes of mixed vertical and horizontal diplopia with right frontal and occipital pressure  Afterwards, she started to develop her habitual vertigo symptoms.  She went to [facility], where she was admitted for a TIA workup. CT of head was normal.  MRI of the brain showed few nonspecific punctate T2 and FLAIR hypertense foci in the periventricular white matter, but otherwise unremarkable.  MRA of the head was normal.  2D echo showed EF 37-90% with no embolic source.  Carotid doppler showed no hemodynamically significant internal carotid artery stenosis.  It did show atypical flow to the left internal jugular vein and left internal carotid artery, suggesting an AV fistula.  LDL was 83 and Hgb A1c was 5.7.  She was started on ASA '325mg'$  daily and Lipitor '10mg'$  daily.  She returned to the ED on 06/13/15 with burning sensation of chest, shoulders, neck and head, associated with palpitations and shortness of breath.  EKG, and labs such as troponins, were negative.  Since it occurred after taking Lipitor, it was advised not to take it.  Panic attack was also suspected.  She has history of recurrent vertigo.  They usually occur twice a year, in the Fall and in the Spring.  They  last about a week.  They usually occur first thing in the morning.  She describes right sided head pressure as well as dizzy sensation with some component of spinning.  It is not brief or just positional.  There is associated photophobia.  They respond to meclizine.  She denies personal or family history of migraines.  She does report seasonal headaches attributed to allergies.  They usually respond to Arion.  PAST MEDICAL HISTORY: Past Medical History  Diagnosis Date  . Fibrocystic breast disease   . Atopic eczema   . Allergy     SEASONAL  . Anemia     HIGH SCHOOL  . GERD (gastroesophageal reflux disease)   . TIA (transient ischemic attack)     MEDICATIONS: Current Outpatient Prescriptions on File Prior to Visit  Medication Sig Dispense Refill  . aspirin 81 MG tablet Take 81 mg by mouth daily.    . cetirizine (ZYRTEC) 10 MG tablet Take 10 mg by mouth daily as needed. For allergies     . Cholecalciferol (VITAMIN D3) 5000 UNITS CAPS Take 1 capsule by mouth daily.    . Ciclopirox 1 % shampoo Use as directed 2x a week for up to 4 weeks 120 mL 1  . meclizine (ANTIVERT) 50 MG tablet Take 1 tablet (50 mg total) by mouth 3 (three) times daily as needed. 30 tablet 0  . mometasone (ELOCON) 0.1 % lotion Apply topically daily. 60 mL 0  . ondansetron (ZOFRAN) 4  MG tablet Take 1 tablet (4 mg total) by mouth every 6 (six) hours. 12 tablet 0  . Prenatal Vit-Fe Fumarate-FA (PRENATAL MULTIVITAMIN) TABS tablet Take 2 tablets by mouth daily at 12 noon.    Marland Kitchen VITAMIN K PO Take 1 capsule by mouth daily.     No current facility-administered medications on file prior to visit.    ALLERGIES: Allergies  Allergen Reactions  . Penicillins Anaphylaxis    Has patient had a PCN reaction causing immediate rash, facial/tongue/throat swelling, SOB or lightheadedness with hypotensionYes Swelling in throat  Has patient had a PCN reaction causing severe rash involving mucus membranes or skin necrosis:/No Has  patient had a PCN reaction that required hospitalization/No Has patient had a PCN reaction occurring within the last 10 years:NO If all of the above answers are "NO", then may proceed with Cephalosporin use.   . Lipitor [Atorvastatin] Palpitations    Tingling, flushing    FAMILY HISTORY: Family History  Problem Relation Age of Onset  . Gout Mother   . Diabetes Mother   . Hypertension Father   . Diabetes Father   . Breast cancer Paternal Aunt   . Thyroid disease Neg Hx     SOCIAL HISTORY: Social History   Social History  . Marital Status: Married    Spouse Name: N/A  . Number of Children: 0  . Years of Education: 18   Occupational History  . [employer]  .     Social History Main Topics  . Smoking status: Never Smoker   . Smokeless tobacco: Never Used  . Alcohol Use: Yes     Comment: maybe 3 times a year  . Drug Use: No  . Sexual Activity:    Partners: Male   Other Topics Concern  . Not on file   Social History Narrative    REVIEW OF SYSTEMS: Constitutional: No fevers, chills, or sweats, no generalized fatigue, change in appetite Eyes: No visual changes, double vision, eye pain Ear, nose and throat: No hearing loss, ear pain, nasal congestion, sore throat Cardiovascular: No chest pain, palpitations Respiratory:  No shortness of breath at rest or with exertion, wheezes GastrointestinaI: No nausea, vomiting, diarrhea, abdominal pain, fecal incontinence Genitourinary:  No dysuria, urinary retention or frequency Musculoskeletal:  No neck pain, back pain Integumentary: No rash, pruritus, skin lesions Neurological: as above Psychiatric: No depression, insomnia, anxiety Endocrine: No palpitations, fatigue, diaphoresis, mood swings, change in appetite, change in weight, increased thirst Hematologic/Lymphatic:  No anemia, purpura, petechiae. Allergic/Immunologic: no itchy/runny eyes, nasal congestion, recent allergic reactions, rashes  PHYSICAL  EXAM: Filed Vitals:   12/24/15 1352  BP: 128/80  Pulse: 68   General: No acute distress.  Patient appears well-groomed.  normal body habitus. Head:  Normocephalic/atraumatic Eyes:  Fundi examined but not visualized Neck: supple, no paraspinal tenderness, full range of motion Heart:  Regular rate and rhythm Lungs:  Clear to auscultation bilaterally Back: No paraspinal tenderness Neurological Exam: alert and oriented to person, place, and time. Attention span and concentration intact, recent and remote memory intact, fund of knowledge intact.  Speech fluent and not dysarthric, language intact.  CN II-XII intact. Bulk and tone normal, muscle strength 5/5 throughout.  Sensation to light touch, temperature and vibration intact.  Deep tendon reflexes 2+ throughout, toes downgoing.  Finger to nose and heel to shin testing intact.  Gait normal, Romberg negative.  IMPRESSION: Vestibular migraine.    PLAN: I offered to prescribe her a prednisone taper, but  she says the headache/vertigo is manageable and would like to wait it out.  If it persists, she will call. Otherwise, she will follow up in 6 months.  15 minutes spent face to face with patient, over 50% spent discussing management.  Metta Clines, DO  CC:  Garnet Koyanagi, DO

## 2016-02-20 ENCOUNTER — Encounter: Payer: Federal, State, Local not specified - PPO | Admitting: Family Medicine

## 2016-02-25 ENCOUNTER — Encounter: Payer: Self-pay | Admitting: Neurology

## 2016-02-25 ENCOUNTER — Telehealth: Payer: Self-pay | Admitting: Family Medicine

## 2016-02-25 NOTE — Telephone Encounter (Signed)
Dr Larose Kells likes doing those things too--- I don't remember what it looked like ---- if it has been there a long time a general surgeon may be the better choice

## 2016-02-25 NOTE — Telephone Encounter (Signed)
Caller name: Relationship to patient: Self  Can be reached:[phone]   Reason for call: Patient called stating that she needed an appointment with Dr. Carollee Herter on July 7th to have a cyst removed from her back. Provider is not here that day so patient request to see someone else on that day to have it removed. States provider told her she would remove it. Plse adv

## 2016-02-25 NOTE — Telephone Encounter (Signed)
Please advise or should she be sent to the surgeon?    KP

## 2016-02-26 NOTE — Telephone Encounter (Signed)
Detailed message left making the patient aware.    KP

## 2016-02-26 NOTE — Telephone Encounter (Signed)
She said she showed you the cyst 2 years ago, it is near the spinal column toward the lower part of her back. She said when you see it you will remember it, but you told her you could remove it.    KP

## 2016-02-26 NOTE — Telephone Encounter (Signed)
2 yrs ago is a big difference-- it may be too old for [place] to do-- you can see if Dr Larose Kells is willing  I won't be back until after next week-- she can wait until then but no guarantee I will be able to do it if its been there 2 years--- would have to see it

## 2016-02-26 NOTE — Telephone Encounter (Signed)
Discussed with Percell Miller and Dr.Lowne and Percell Miller said he would look at it and if he can remove it then he will, if not he would send her to the surgeon.    KP

## 2016-02-27 NOTE — Telephone Encounter (Signed)
Another message left advising apt scheduled on 03/05/16 as requested with Mackie Pai. Call if she needs to reschedule.    KP

## 2016-03-04 ENCOUNTER — Telehealth: Payer: Self-pay

## 2016-03-04 NOTE — Telephone Encounter (Signed)
Pre Visit call completed with patient.

## 2016-03-05 ENCOUNTER — Encounter: Payer: Self-pay | Admitting: Medical

## 2016-03-05 ENCOUNTER — Ambulatory Visit (INDEPENDENT_AMBULATORY_CARE_PROVIDER_SITE_OTHER): Payer: Federal, State, Local not specified - PPO | Admitting: Medical

## 2016-03-05 VITALS — BP 122/80 | HR 87 | Temp 98.4°F | Ht 66.0 in | Wt 157.8 lb

## 2016-03-05 DIAGNOSIS — L723 Sebaceous cyst: Secondary | ICD-10-CM

## 2016-03-05 MED ORDER — DOXYCYCLINE HYCLATE 100 MG PO TABS: 100.0000 mg | ORAL_TABLET | Freq: Two times a day (BID) | ORAL | Status: DC

## 2016-03-05 NOTE — Addendum Note (Signed)
Addended by: Anabel Halon on: 03/05/2016 09:21 AM   Modules accepted: Orders

## 2016-03-05 NOTE — Progress Notes (Addendum)
Subjective:    Patient ID: Nancy [name], female    DOB: Aug 16, 1966, 50 y.o.   MRN: [id number]  HPI   Pt in for area on her back. This has been present for a couple of years. No pain. Area will drain if she squeezes.  No pain.  Pt ready to have are fixed. Explains tired of.  Pt has wedding on 71 th of friend. Flying out on 19th.   Review of Systems  Constitutional: Negative for fever, chills and fatigue.  Respiratory: Negative for cough, chest tightness, shortness of breath and wheezing.   Cardiovascular: Negative for chest pain and palpitations.  Skin:       Rt side sebacious cyst.  Hematological: Negative for adenopathy. Does not bruise/bleed easily.    Past Medical History  Diagnosis Date  . Fibrocystic breast disease   . Atopic eczema   . Allergy     SEASONAL  . Anemia     HIGH SCHOOL  . GERD (gastroesophageal reflux disease)   . TIA (transient ischemic attack)      Social History   Social History  . Marital Status: Married    Spouse Name: N/A  . Number of Children: 0  . Years of Education: 18   Occupational History  . [employer]  .     Social History Main Topics  . Smoking status: Never Smoker   . Smokeless tobacco: Never Used  . Alcohol Use: Yes     Comment: maybe 3 times a year  . Drug Use: No  . Sexual Activity:    Partners: Male   Other Topics Concern  . Not on file   Social History Narrative    Past Surgical History  Procedure Laterality Date  . Myomectomy  2002    Family History  Problem Relation Age of Onset  . Gout Mother   . Diabetes Mother   . Hypertension Father   . Diabetes Father   . Breast cancer Paternal Aunt   . Thyroid disease Neg Hx     Allergies  Allergen Reactions  . Penicillins Anaphylaxis    Has patient had a PCN reaction causing immediate rash, facial/tongue/throat swelling, SOB or lightheadedness with hypotensionYes Swelling in throat  Has patient had a PCN reaction causing severe rash  involving mucus membranes or skin necrosis:/No Has patient had a PCN reaction that required hospitalization/No Has patient had a PCN reaction occurring within the last 10 years:NO If all of the above answers are "NO", then may proceed with Cephalosporin use.   . Lipitor [Atorvastatin] Palpitations    Tingling, flushing    Current Outpatient Prescriptions on File Prior to Visit  Medication Sig Dispense Refill  . aspirin 81 MG tablet Take 81 mg by mouth daily.    . cetirizine (ZYRTEC) 10 MG tablet Take 10 mg by mouth daily as needed. For allergies     . Cholecalciferol (VITAMIN D3) 5000 UNITS CAPS Take 1 capsule by mouth daily.    . Ciclopirox 1 % shampoo Use as directed 2x a week for up to 4 weeks 120 mL 1  . meclizine (ANTIVERT) 50 MG tablet Take 1 tablet (50 mg total) by mouth 3 (three) times daily as needed. 30 tablet 0  . mometasone (ELOCON) 0.1 % lotion Apply topically daily. 60 mL 0  . ondansetron (ZOFRAN) 4 MG tablet Take 1 tablet (4 mg total) by mouth every 6 (six) hours. 12 tablet 0  . Prenatal Vit-Fe Fumarate-FA (PRENATAL MULTIVITAMIN)  TABS tablet Take 2 tablets by mouth daily at 12 noon.    Marland Kitchen VITAMIN K PO Take 1 capsule by mouth daily.     No current facility-administered medications on file prior to visit.    BP 122/80 mmHg  Pulse 87  Temp(Src) 98.4 F (36.9 C) (Oral)  Ht 5\' 6"  (1.676 m)  Wt 157 lb 12.8 oz (71.578 kg)  BMI 25.48 kg/m2  SpO2 98%  LMP 02/16/2016       Objective:   Physical Exam   General- No acute distress. Pleasant patient.  Lungs- Clear, even and unlabored. Heart- regular rate and rhythm. Neurologic- CNII- XII grossly intact.  Derm- back. Rt of med t-spine. 1 cm sized sebacious cyst.(appearance possibly infected).        Assessment & Plan:  For you sebaceous cyst will rx antibiotic. Will go ahead and refer to dermatologist. Try to get you in by 26 th, 27 th or 28 th of July.   Will prescribe doxycycline  for possible infection and  reduce inflammation.   If no call on on appointment by one week then call and speak to [facility]. She can investigate the referral.  Follow up her as needed prior to dermatologist  Tanishka Drolet, Ramon Dredge, PA-C

## 2016-03-05 NOTE — Patient Instructions (Addendum)
For you sebaceous cyst will rx antibiotic doxycycline . Will go ahead and refer to dermatologist. Try to get you in by 26 th, 20 th or 28 th of July.   Will prescribe doxycycline for possible infection and reduce inflammation.   If no call on on appointment by one week then call and speak to [facility]. She can investigate the referral.  Follow up her as needed prior to dermatologist  Epidermal Cyst An epidermal cyst is sometimes called a sebaceous cyst, epidermal inclusion cyst, or infundibular cyst. These cysts usually contain a substance that looks "pasty" or "cheesy" and may have a bad smell. This substance is a protein called keratin. Epidermal cysts are usually found on the face, neck, or trunk. They may also occur in the vaginal area or other parts of the genitalia of both men and women. Epidermal cysts are usually small, painless, slow-growing bumps or lumps that move freely under the skin. It is important not to try to pop them. This may cause an infection and lead to tenderness and swelling. CAUSES  Epidermal cysts may be caused by a deep penetrating injury to the skin or a plugged hair follicle, often associated with acne. SYMPTOMS  Epidermal cysts can become inflamed and cause:  Redness.  Tenderness.  Increased temperature of the skin over the bumps or lumps.  Grayish-white, bad smelling material that drains from the bump or lump. DIAGNOSIS  Epidermal cysts are easily diagnosed by your caregiver during an exam. Rarely, a tissue sample (biopsy) may be taken to rule out other conditions that may resemble epidermal cysts. TREATMENT   Epidermal cysts often get better and disappear on their own. They are rarely ever cancerous.  If a cyst becomes infected, it may become inflamed and tender. This may require opening and draining the cyst. Treatment with antibiotics may be necessary. When the infection is gone, the cyst may be removed with minor surgery.  Small, inflamed cysts can  often be treated with antibiotics or by injecting steroid medicines.  Sometimes, epidermal cysts become large and bothersome. If this happens, surgical removal in your caregiver's office may be necessary. HOME CARE INSTRUCTIONS  Only take over-the-counter or prescription medicines as directed by your caregiver.  Take your antibiotics as directed. Finish them even if you start to feel better. SEEK MEDICAL CARE IF:   Your cyst becomes tender, red, or swollen.  Your condition is not improving or is getting worse.  You have any other questions or concerns. MAKE SURE YOU:  Understand these instructions.  Will watch your condition.  Will get help right away if you are not doing well or get worse.   This information is not intended to replace advice given to you by your health care provider. Make sure you discuss any questions you have with your health care provider.   Document Released: 07/17/2004 Document Revised: 11/08/2011 Document Reviewed: 02/22/2011 Elsevier Interactive Patient Education [employer].

## 2016-03-05 NOTE — Progress Notes (Signed)
Pre visit review using our clinic review tool, if applicable. No additional management support is needed unless otherwise documented below in the visit note. 

## 2016-03-12 ENCOUNTER — Ambulatory Visit (INDEPENDENT_AMBULATORY_CARE_PROVIDER_SITE_OTHER): Payer: Federal, State, Local not specified - PPO | Admitting: Family Medicine

## 2016-03-12 ENCOUNTER — Encounter: Payer: Self-pay | Admitting: Family Medicine

## 2016-03-12 ENCOUNTER — Other Ambulatory Visit (HOSPITAL_COMMUNITY)
Admission: RE | Admit: 2016-03-12 | Discharge: 2016-03-12 | Disposition: A | Discharge status: Home / Self Care | Payer: Federal, State, Local not specified - PPO | Source: Ambulatory Visit | Attending: Family Medicine | Admitting: Family Medicine

## 2016-03-12 VITALS — BP 121/82 | HR 67 | Temp 98.1°F | Ht 66.0 in | Wt 158.0 lb

## 2016-03-12 DIAGNOSIS — Z1151 Encounter for screening for human papillomavirus (HPV): Secondary | ICD-10-CM | POA: Insufficient documentation

## 2016-03-12 DIAGNOSIS — Z87898 Personal history of other specified conditions: Secondary | ICD-10-CM

## 2016-03-12 DIAGNOSIS — Z9189 Other specified personal risk factors, not elsewhere classified: Secondary | ICD-10-CM

## 2016-03-12 DIAGNOSIS — Z124 Encounter for screening for malignant neoplasm of cervix: Secondary | ICD-10-CM | POA: Diagnosis not present

## 2016-03-12 DIAGNOSIS — E559 Vitamin D deficiency, unspecified: Secondary | ICD-10-CM

## 2016-03-12 DIAGNOSIS — Z01419 Encounter for gynecological examination (general) (routine) without abnormal findings: Secondary | ICD-10-CM | POA: Insufficient documentation

## 2016-03-12 DIAGNOSIS — Z0000 Encounter for general adult medical examination without abnormal findings: Secondary | ICD-10-CM

## 2016-03-12 NOTE — Patient Instructions (Signed)
Preventive Care for Adults, Female A healthy lifestyle and preventive care can promote health and wellness. Preventive health guidelines for women include the following key practices.  A routine yearly physical is a good way to check with your health care provider about your health and preventive screening. It is a chance to share any concerns and updates on your health and to receive a thorough exam.  Visit your dentist for a routine exam and preventive care every 6 months. Brush your teeth twice a day and floss once a day. Good oral hygiene prevents tooth decay and gum disease.  The frequency of eye exams is based on your age, health, family medical history, use of contact lenses, and other factors. Follow your health care provider's recommendations for frequency of eye exams.  Eat a healthy diet. Foods like vegetables, fruits, whole grains, low-fat dairy products, and lean protein foods contain the nutrients you need without too many calories. Decrease your intake of foods high in solid fats, added sugars, and salt. Eat the right amount of calories for you.Get information about a proper diet from your health care provider, if necessary.  Regular physical exercise is one of the most important things you can do for your health. Most adults should get at least 150 minutes of moderate-intensity exercise (any activity that increases your heart rate and causes you to sweat) each week. In addition, most adults need muscle-strengthening exercises on 2 or more days a week.  Maintain a healthy weight. The body mass index (BMI) is a screening tool to identify possible weight problems. It provides an estimate of body fat based on height and weight. Your health care provider can find your BMI and can help you achieve or maintain a healthy weight.For adults 20 years and older:  A BMI below 18.5 is considered underweight.  A BMI of 18.5 to 24.9 is normal.  A BMI of 25 to 29.9 is considered overweight.  A  BMI of 30 and above is considered obese.  Maintain normal blood lipids and cholesterol levels by exercising and minimizing your intake of saturated fat. Eat a balanced diet with plenty of fruit and vegetables. Blood tests for lipids and cholesterol should begin at age 45 and be repeated every 5 years. If your lipid or cholesterol levels are high, you are over 50, or you are at high risk for heart disease, you may need your cholesterol levels checked more frequently.Ongoing high lipid and cholesterol levels should be treated with medicines if diet and exercise are not working.  If you smoke, find out from your health care provider how to quit. If you do not use tobacco, do not start.  Lung cancer screening is recommended for adults aged 45-80 years who are at high risk for developing lung cancer because of a history of smoking. A yearly low-dose CT scan of the lungs is recommended for people who have at least a 30-pack-year history of smoking and are a current smoker or have quit within the past 15 years. A pack year of smoking is smoking an average of 1 pack of cigarettes a day for 1 year (for example: 1 pack a day for 30 years or 2 packs a day for 15 years). Yearly screening should continue until the smoker has stopped smoking for at least 15 years. Yearly screening should be stopped for people who develop a health problem that would prevent them from having lung cancer treatment.  If you are pregnant, do not drink alcohol. If you are  breastfeeding, be very cautious about drinking alcohol. If you are not pregnant and choose to drink alcohol, do not have more than 1 drink per day. One drink is considered to be 12 ounces (355 mL) of beer, 5 ounces (148 mL) of wine, or 1.5 ounces (44 mL) of liquor.  Avoid use of street drugs. Do not share needles with anyone. Ask for help if you need support or instructions about stopping the use of drugs.  High blood pressure causes heart disease and increases the risk  of stroke. Your blood pressure should be checked at least every 1 to 2 years. Ongoing high blood pressure should be treated with medicines if weight loss and exercise do not work.  If you are 55-79 years old, ask your health care provider if you should take aspirin to prevent strokes.  Diabetes screening is done by taking a blood sample to check your blood glucose level after you have not eaten for a certain period of time (fasting). If you are not overweight and you do not have risk factors for diabetes, you should be screened once every 3 years starting at age 45. If you are overweight or obese and you are 40-70 years of age, you should be screened for diabetes every year as part of your cardiovascular risk assessment.  Breast cancer screening is essential preventive care for women. You should practice "breast self-awareness." This means understanding the normal appearance and feel of your breasts and may include breast self-examination. Any changes detected, no matter how small, should be reported to a health care provider. Women in their 20s and 30s should have a clinical breast exam (CBE) by a health care provider as part of a regular health exam every 1 to 3 years. After age 40, women should have a CBE every year. Starting at age 40, women should consider having a mammogram (breast X-ray test) every year. Women who have a family history of breast cancer should talk to their health care provider about genetic screening. Women at a high risk of breast cancer should talk to their health care providers about having an MRI and a mammogram every year.  Breast cancer gene (BRCA)-related cancer risk assessment is recommended for women who have family members with BRCA-related cancers. BRCA-related cancers include breast, ovarian, tubal, and peritoneal cancers. Having family members with these cancers may be associated with an increased risk for harmful changes (mutations) in the breast cancer genes BRCA1 and  BRCA2. Results of the assessment will determine the need for genetic counseling and BRCA1 and BRCA2 testing.  Your health care provider may recommend that you be screened regularly for cancer of the pelvic organs (ovaries, uterus, and vagina). This screening involves a pelvic examination, including checking for microscopic changes to the surface of your cervix (Pap test). You may be encouraged to have this screening done every 3 years, beginning at age 21.  For women ages 30-65, health care providers may recommend pelvic exams and Pap testing every 3 years, or they may recommend the Pap and pelvic exam, combined with testing for human papilloma virus (HPV), every 5 years. Some types of HPV increase your risk of cervical cancer. Testing for HPV may also be done on women of any age with unclear Pap test results.  Other health care providers may not recommend any screening for nonpregnant women who are considered low risk for pelvic cancer and who do not have symptoms. Ask your health care provider if a screening pelvic exam is right for   you.  If you have had past treatment for cervical cancer or a condition that could lead to cancer, you need Pap tests and screening for cancer for at least 20 years after your treatment. If Pap tests have been discontinued, your risk factors (such as having a new sexual partner) need to be reassessed to determine if screening should resume. Some women have medical problems that increase the chance of getting cervical cancer. In these cases, your health care provider may recommend more frequent screening and Pap tests.  Colorectal cancer can be detected and often prevented. Most routine colorectal cancer screening begins at the age of 50 years and continues through age 75 years. However, your health care provider may recommend screening at an earlier age if you have risk factors for colon cancer. On a yearly basis, your health care provider may provide home test kits to check  for hidden blood in the stool. Use of a small camera at the end of a tube, to directly examine the colon (sigmoidoscopy or colonoscopy), can detect the earliest forms of colorectal cancer. Talk to your health care provider about this at age 50, when routine screening begins. Direct exam of the colon should be repeated every 5-10 years through age 75 years, unless early forms of precancerous polyps or small growths are found.  People who are at an increased risk for hepatitis B should be screened for this virus. You are considered at high risk for hepatitis B if:  You were born in a country where hepatitis B occurs often. Talk with your health care provider about which countries are considered high risk.  Your parents were born in a high-risk country and you have not received a shot to protect against hepatitis B (hepatitis B vaccine).  You have HIV or AIDS.  You use needles to inject street drugs.  You live with, or have sex with, someone who has hepatitis B.  You get hemodialysis treatment.  You take certain medicines for conditions like cancer, organ transplantation, and autoimmune conditions.  Hepatitis C blood testing is recommended for all people born from 1945 through 1965 and any individual with known risks for hepatitis C.  Practice safe sex. Use condoms and avoid high-risk sexual practices to reduce the spread of sexually transmitted infections (STIs). STIs include gonorrhea, chlamydia, syphilis, trichomonas, herpes, HPV, and human immunodeficiency virus (HIV). Herpes, HIV, and HPV are viral illnesses that have no cure. They can result in disability, cancer, and death.  You should be screened for sexually transmitted illnesses (STIs) including gonorrhea and chlamydia if:  You are sexually active and are younger than 24 years.  You are older than 24 years and your health care provider tells you that you are at risk for this type of infection.  Your sexual activity has changed  since you were last screened and you are at an increased risk for chlamydia or gonorrhea. Ask your health care provider if you are at risk.  If you are at risk of being infected with HIV, it is recommended that you take a prescription medicine daily to prevent HIV infection. This is called preexposure prophylaxis (PrEP). You are considered at risk if:  You are sexually active and do not regularly use condoms or know the HIV status of your partner(s).  You take drugs by injection.  You are sexually active with a partner who has HIV.  Talk with your health care provider about whether you are at high risk of being infected with HIV. If   you choose to begin PrEP, you should first be tested for HIV. You should then be tested every 3 months for as long as you are taking PrEP.  Osteoporosis is a disease in which the bones lose minerals and strength with aging. This can result in serious bone fractures or breaks. The risk of osteoporosis can be identified using a bone density scan. Women ages 67 years and over and women at risk for fractures or osteoporosis should discuss screening with their health care providers. Ask your health care provider whether you should take a calcium supplement or vitamin D to reduce the rate of osteoporosis.  Menopause can be associated with physical symptoms and risks. Hormone replacement therapy is available to decrease symptoms and risks. You should talk to your health care provider about whether hormone replacement therapy is right for you.  Use sunscreen. Apply sunscreen liberally and repeatedly throughout the day. You should seek shade when your shadow is shorter than you. Protect yourself by wearing long sleeves, pants, a wide-brimmed hat, and sunglasses year round, whenever you are outdoors.  Once a month, do a whole body skin exam, using a mirror to look at the skin on your back. Tell your health care provider of new moles, moles that have irregular borders, moles that  are larger than a pencil eraser, or moles that have changed in shape or color.  Stay current with required vaccines (immunizations).  Influenza vaccine. All adults should be immunized every year.  Tetanus, diphtheria, and acellular pertussis (Td, Tdap) vaccine. Pregnant women should receive 1 dose of Tdap vaccine during each pregnancy. The dose should be obtained regardless of the length of time since the last dose. Immunization is preferred during the 27th-36th week of gestation. An adult who has not previously received Tdap or who does not know her vaccine status should receive 1 dose of Tdap. This initial dose should be followed by tetanus and diphtheria toxoids (Td) booster doses every 10 years. Adults with an unknown or incomplete history of completing a 3-dose immunization series with Td-containing vaccines should begin or complete a primary immunization series including a Tdap dose. Adults should receive a Td booster every 10 years.  Varicella vaccine. An adult without evidence of immunity to varicella should receive 2 doses or a second dose if she has previously received 1 dose. Pregnant females who do not have evidence of immunity should receive the first dose after pregnancy. This first dose should be obtained before leaving the health care facility. The second dose should be obtained 4-8 weeks after the first dose.  Human papillomavirus (HPV) vaccine. Females aged 13-26 years who have not received the vaccine previously should obtain the 3-dose series. The vaccine is not recommended for use in pregnant females. However, pregnancy testing is not needed before receiving a dose. If a female is found to be pregnant after receiving a dose, no treatment is needed. In that case, the remaining doses should be delayed until after the pregnancy. Immunization is recommended for any person with an immunocompromised condition through the age of 61 years if she did not get any or all doses earlier. During the  3-dose series, the second dose should be obtained 4-8 weeks after the first dose. The third dose should be obtained 24 weeks after the first dose and 16 weeks after the second dose.  Zoster vaccine. One dose is recommended for adults aged 30 years or older unless certain conditions are present.  Measles, mumps, and rubella (MMR) vaccine. Adults born  before 1957 generally are considered immune to measles and mumps. Adults born in 1957 or later should have 1 or more doses of MMR vaccine unless there is a contraindication to the vaccine or there is laboratory evidence of immunity to each of the three diseases. A routine second dose of MMR vaccine should be obtained at least 28 days after the first dose for students attending postsecondary schools, health care workers, or international travelers. People who received inactivated measles vaccine or an unknown type of measles vaccine during 1963-1967 should receive 2 doses of MMR vaccine. People who received inactivated mumps vaccine or an unknown type of mumps vaccine before 1979 and are at high risk for mumps infection should consider immunization with 2 doses of MMR vaccine. For females of childbearing age, rubella immunity should be determined. If there is no evidence of immunity, females who are not pregnant should be vaccinated. If there is no evidence of immunity, females who are pregnant should delay immunization until after pregnancy. Unvaccinated health care workers born before 1957 who lack laboratory evidence of measles, mumps, or rubella immunity or laboratory confirmation of disease should consider measles and mumps immunization with 2 doses of MMR vaccine or rubella immunization with 1 dose of MMR vaccine.  Pneumococcal 13-valent conjugate (PCV13) vaccine. When indicated, a person who is uncertain of his immunization history and has no record of immunization should receive the PCV13 vaccine. All adults 65 years of age and older should receive this  vaccine. An adult aged 19 years or older who has certain medical conditions and has not been previously immunized should receive 1 dose of PCV13 vaccine. This PCV13 should be followed with a dose of pneumococcal polysaccharide (PPSV23) vaccine. Adults who are at high risk for pneumococcal disease should obtain the PPSV23 vaccine at least 8 weeks after the dose of PCV13 vaccine. Adults older than 49 years of age who have normal immune system function should obtain the PPSV23 vaccine dose at least 1 year after the dose of PCV13 vaccine.  Pneumococcal polysaccharide (PPSV23) vaccine. When PCV13 is also indicated, PCV13 should be obtained first. All adults aged 65 years and older should be immunized. An adult younger than age 65 years who has certain medical conditions should be immunized. Any person who resides in a nursing home or long-term care facility should be immunized. An adult smoker should be immunized. People with an immunocompromised condition and certain other conditions should receive both PCV13 and PPSV23 vaccines. People with human immunodeficiency virus (HIV) infection should be immunized as soon as possible after diagnosis. Immunization during chemotherapy or radiation therapy should be avoided. Routine use of PPSV23 vaccine is not recommended for American Indians, [place] Natives, or people younger than 65 years unless there are medical conditions that require PPSV23 vaccine. When indicated, people who have unknown immunization and have no record of immunization should receive PPSV23 vaccine. One-time revaccination 5 years after the first dose of PPSV23 is recommended for people aged 19-64 years who have chronic kidney failure, nephrotic syndrome, asplenia, or immunocompromised conditions. People who received 1-2 doses of PPSV23 before age 65 years should receive another dose of PPSV23 vaccine at age 65 years or later if at least 5 years have passed since the previous dose. Doses of PPSV23 are not  needed for people immunized with PPSV23 at or after age 65 years.  Meningococcal vaccine. Adults with asplenia or persistent complement component deficiencies should receive 2 doses of quadrivalent meningococcal conjugate (MenACWY-D) vaccine. The doses should be obtained   at least 2 months apart. Microbiologists working with certain meningococcal bacteria, [place] recruits, people at risk during an outbreak, and people who travel to or live in countries with a high rate of meningitis should be immunized. A first-year college student up through age 34 years who is living in a residence hall should receive a dose if she did not receive a dose on or after her 16th birthday. Adults who have certain high-risk conditions should receive one or more doses of vaccine.  Hepatitis A vaccine. Adults who wish to be protected from this disease, have certain high-risk conditions, work with hepatitis A-infected animals, work in hepatitis A research labs, or travel to or work in countries with a high rate of hepatitis A should be immunized. Adults who were previously unvaccinated and who anticipate close contact with an international adoptee during the first 60 days after arrival in the [place] from a country with a high rate of hepatitis A should be immunized.  Hepatitis B vaccine. Adults who wish to be protected from this disease, have certain high-risk conditions, may be exposed to blood or other infectious body fluids, are household contacts or sex partners of hepatitis B positive people, are clients or workers in certain care facilities, or travel to or work in countries with a high rate of hepatitis B should be immunized.  Haemophilus influenzae type b (Hib) vaccine. A previously unvaccinated person with asplenia or sickle cell disease or having a scheduled splenectomy should receive 1 dose of Hib vaccine. Regardless of previous immunization, a recipient of a hematopoietic stem cell transplant should receive a  3-dose series 6-12 months after her successful transplant. Hib vaccine is not recommended for adults with HIV infection. Preventive Services / Frequency Ages 35 to 4 years  Blood pressure check.** / Every 3-5 years.  Lipid and cholesterol check.** / Every 5 years beginning at age 60.  Clinical breast exam.** / Every 3 years for women in their 71s and 10s.  BRCA-related cancer risk assessment.** / For women who have family members with a BRCA-related cancer (breast, ovarian, tubal, or peritoneal cancers).  Pap test.** / Every 2 years from ages 76 through 26. Every 3 years starting at age 61 through age 76 or 93 with a history of 3 consecutive normal Pap tests.  HPV screening.** / Every 3 years from ages 37 through ages 60 to 51 with a history of 3 consecutive normal Pap tests.  Hepatitis C blood test.** / For any individual with known risks for hepatitis C.  Skin self-exam. / Monthly.  Influenza vaccine. / Every year.  Tetanus, diphtheria, and acellular pertussis (Tdap, Td) vaccine.** / Consult your health care provider. Pregnant women should receive 1 dose of Tdap vaccine during each pregnancy. 1 dose of Td every 10 years.  Varicella vaccine.** / Consult your health care provider. Pregnant females who do not have evidence of immunity should receive the first dose after pregnancy.  HPV vaccine. / 3 doses over 6 months, if 93 and younger. The vaccine is not recommended for use in pregnant females. However, pregnancy testing is not needed before receiving a dose.  Measles, mumps, rubella (MMR) vaccine.** / You need at least 1 dose of MMR if you were born in 1957 or later. You may also need a 2nd dose. For females of childbearing age, rubella immunity should be determined. If there is no evidence of immunity, females who are not pregnant should be vaccinated. If there is no evidence of immunity, females who are  pregnant should delay immunization until after pregnancy.  Pneumococcal  13-valent conjugate (PCV13) vaccine.** / Consult your health care provider.  Pneumococcal polysaccharide (PPSV23) vaccine.** / 1 to 2 doses if you smoke cigarettes or if you have certain conditions.  Meningococcal vaccine.** / 1 dose if you are age 68 to 8 years and a Market researcher living in a residence hall, or have one of several medical conditions, you need to get vaccinated against meningococcal disease. You may also need additional booster doses.  Hepatitis A vaccine.** / Consult your health care provider.  Hepatitis B vaccine.** / Consult your health care provider.  Haemophilus influenzae type b (Hib) vaccine.** / Consult your health care provider. Ages 7 to 53 years  Blood pressure check.** / Every year.  Lipid and cholesterol check.** / Every 5 years beginning at age 25 years.  Lung cancer screening. / Every year if you are aged 11-80 years and have a 30-pack-year history of smoking and currently smoke or have quit within the past 15 years. Yearly screening is stopped once you have quit smoking for at least 15 years or develop a health problem that would prevent you from having lung cancer treatment.  Clinical breast exam.** / Every year after age 48 years.  BRCA-related cancer risk assessment.** / For women who have family members with a BRCA-related cancer (breast, ovarian, tubal, or peritoneal cancers).  Mammogram.** / Every year beginning at age 41 years and continuing for as long as you are in good health. Consult with your health care provider.  Pap test.** / Every 3 years starting at age 65 years through age 37 or 70 years with a history of 3 consecutive normal Pap tests.  HPV screening.** / Every 3 years from ages 72 years through ages 60 to 40 years with a history of 3 consecutive normal Pap tests.  Fecal occult blood test (FOBT) of stool. / Every year beginning at age 21 years and continuing until age 5 years. You may not need to do this test if you get  a colonoscopy every 10 years.  Flexible sigmoidoscopy or colonoscopy.** / Every 5 years for a flexible sigmoidoscopy or every 10 years for a colonoscopy beginning at age 35 years and continuing until age 48 years.  Hepatitis C blood test.** / For all people born from [birth year] through 1965 and any individual with known risks for hepatitis C.  Skin self-exam. / Monthly.  Influenza vaccine. / Every year.  Tetanus, diphtheria, and acellular pertussis (Tdap/Td) vaccine.** / Consult your health care provider. Pregnant women should receive 1 dose of Tdap vaccine during each pregnancy. 1 dose of Td every 10 years.  Varicella vaccine.** / Consult your health care provider. Pregnant females who do not have evidence of immunity should receive the first dose after pregnancy.  Zoster vaccine.** / 1 dose for adults aged 30 years or older.  Measles, mumps, rubella (MMR) vaccine.** / You need at least 1 dose of MMR if you were born in 1957 or later. You may also need a second dose. For females of childbearing age, rubella immunity should be determined. If there is no evidence of immunity, females who are not pregnant should be vaccinated. If there is no evidence of immunity, females who are pregnant should delay immunization until after pregnancy.  Pneumococcal 13-valent conjugate (PCV13) vaccine.** / Consult your health care provider.  Pneumococcal polysaccharide (PPSV23) vaccine.** / 1 to 2 doses if you smoke cigarettes or if you have certain conditions.  Meningococcal vaccine.** /  Consult your health care provider.  Hepatitis A vaccine.** / Consult your health care provider.  Hepatitis B vaccine.** / Consult your health care provider.  Haemophilus influenzae type b (Hib) vaccine.** / Consult your health care provider. Ages 64 years and over  Blood pressure check.** / Every year.  Lipid and cholesterol check.** / Every 5 years beginning at age 23 years.  Lung cancer screening. / Every year if you  are aged 16-80 years and have a 30-pack-year history of smoking and currently smoke or have quit within the past 15 years. Yearly screening is stopped once you have quit smoking for at least 15 years or develop a health problem that would prevent you from having lung cancer treatment.  Clinical breast exam.** / Every year after age 74 years.  BRCA-related cancer risk assessment.** / For women who have family members with a BRCA-related cancer (breast, ovarian, tubal, or peritoneal cancers).  Mammogram.** / Every year beginning at age 44 years and continuing for as long as you are in good health. Consult with your health care provider.  Pap test.** / Every 3 years starting at age 58 years through age 22 or 39 years with 3 consecutive normal Pap tests. Testing can be stopped between 65 and 70 years with 3 consecutive normal Pap tests and no abnormal Pap or HPV tests in the past 10 years.  HPV screening.** / Every 3 years from ages 64 years through ages 70 or 61 years with a history of 3 consecutive normal Pap tests. Testing can be stopped between 65 and 70 years with 3 consecutive normal Pap tests and no abnormal Pap or HPV tests in the past 10 years.  Fecal occult blood test (FOBT) of stool. / Every year beginning at age 40 years and continuing until age 27 years. You may not need to do this test if you get a colonoscopy every 10 years.  Flexible sigmoidoscopy or colonoscopy.** / Every 5 years for a flexible sigmoidoscopy or every 10 years for a colonoscopy beginning at age 7 years and continuing until age 32 years.  Hepatitis C blood test.** / For all people born from [birth year] through 1964 and any individual with known risks for hepatitis C.  Osteoporosis screening.** / A one-time screening for women ages 30 years and over and women at risk for fractures or osteoporosis.  Skin self-exam. / Monthly.  Influenza vaccine. / Every year.  Tetanus, diphtheria, and acellular pertussis (Tdap/Td)  vaccine.** / 1 dose of Td every 10 years.  Varicella vaccine.** / Consult your health care provider.  Zoster vaccine.** / 1 dose for adults aged 35 years or older.  Pneumococcal 13-valent conjugate (PCV13) vaccine.** / Consult your health care provider.  Pneumococcal polysaccharide (PPSV23) vaccine.** / 1 dose for all adults aged 46 years and older.  Meningococcal vaccine.** / Consult your health care provider.  Hepatitis A vaccine.** / Consult your health care provider.  Hepatitis B vaccine.** / Consult your health care provider.  Haemophilus influenzae type b (Hib) vaccine.** / Consult your health care provider. ** Family history and personal history of risk and conditions may change your health care provider's recommendations.   This information is not intended to replace advice given to you by your health care provider. Make sure you discuss any questions you have with your health care provider.   Document Released: 10/12/2001 Document Revised: 09/06/2014 Document Reviewed: 01/11/2011 Elsevier Interactive Patient Education [employer].

## 2016-03-12 NOTE — Progress Notes (Signed)
Pre visit review using our clinic review tool, if applicable. No additional management support is needed unless otherwise documented below in the visit note. 

## 2016-03-12 NOTE — Progress Notes (Signed)
Subjective:     Nancy [name] is a 50 y.o. female and is here for a comprehensive physical exam. The patient reports no problems.  Social History   Social History  . Marital Status: Married    Spouse Name: N/A  . Number of Children: 0  . Years of Education: 18   Occupational History  . [employer]  .     Social History Main Topics  . Smoking status: Never Smoker   . Smokeless tobacco: Never Used  . Alcohol Use: Yes     Comment: maybe 3 times a year  . Drug Use: No  . Sexual Activity:    Partners: Male   Other Topics Concern  . Not on file   Social History Narrative   Health Maintenance  Topic Date Due  . HIV Screening  03/24/1982  . MAMMOGRAM  06/21/2015  . INFLUENZA VACCINE  08/20/2016 (Originally 03/30/2016)  . PAP SMEAR  02/16/2018  . COLONOSCOPY  05/23/2019  . TETANUS/TDAP  01/25/2023    The following portions of the patient's history were reviewed and updated as appropriate:  She  has a past medical history of Fibrocystic breast disease; Atopic eczema; Allergy; Anemia; GERD (gastroesophageal reflux disease); and TIA (transient ischemic attack). She  does not have any pertinent problems on file. She  has past surgical history that includes Myomectomy (2002). Her family history includes Breast cancer in her paternal aunt; Diabetes in her father and mother; Gout in her mother; Hypertension in her father. There is no history of Thyroid disease. She  reports that she has never smoked. She has never used smokeless tobacco. She reports that she drinks alcohol. She reports that she does not use illicit drugs. She has a current medication list which includes the following prescription(s): aspirin, cetirizine, vitamin d3, ciclopirox, doxycycline, meclizine, mometasone, NON FORMULARY, dhea, ondansetron, prenatal multivitamin, and vitamin k. Current Outpatient Prescriptions on File Prior to Visit  Medication Sig Dispense Refill  . aspirin 81 MG tablet Take 81  mg by mouth daily.    . cetirizine (ZYRTEC) 10 MG tablet Take 10 mg by mouth daily as needed. For allergies     . Cholecalciferol (VITAMIN D3) 5000 UNITS CAPS Take 1 capsule by mouth daily.    . Ciclopirox 1 % shampoo Use as directed 2x a week for up to 4 weeks 120 mL 1  . doxycycline (VIBRA-TABS) 100 MG tablet Take 1 tablet (100 mg total) by mouth 2 (two) times daily. 20 tablet 0  . meclizine (ANTIVERT) 50 MG tablet Take 1 tablet (50 mg total) by mouth 3 (three) times daily as needed. 30 tablet 0  . mometasone (ELOCON) 0.1 % lotion Apply topically daily. 60 mL 0  . NON FORMULARY 1 capsule 1 day or 1 dose. Pregnelone, pt reports she takes daily.    . Nutritional Supplements (DHEA) 15-50 MG CAPS Take 1 capsule by mouth 3 (three) times a week.    . ondansetron (ZOFRAN) 4 MG tablet Take 1 tablet (4 mg total) by mouth every 6 (six) hours. 12 tablet 0  . Prenatal Vit-Fe Fumarate-FA (PRENATAL MULTIVITAMIN) TABS tablet Take 2 tablets by mouth daily at 12 noon.    Marland Kitchen VITAMIN K PO Take 1 capsule by mouth daily.     No current facility-administered medications on file prior to visit.   She is allergic to penicillins and lipitor..  Review of Systems Review of Systems  Constitutional: Negative for activity change, appetite change and fatigue.  HENT: Negative for hearing loss, congestion, tinnitus and ear discharge.  dentist q14mEyes: Negative for visual disturbance (see optho q1y -- vision corrected to 20/20 with glasses).  Respiratory: Negative for cough, chest tightness and shortness of breath.   Cardiovascular: Negative for chest pain, palpitations and leg swelling.  Gastrointestinal: Negative for abdominal pain, diarrhea, constipation and abdominal distention.  Genitourinary: Negative for urgency, frequency, decreased urine volume and difficulty urinating.  Musculoskeletal: Negative for back pain, arthralgias and gait problem.  Skin: Negative for color change, pallor and rash.  Neurological:  Negative for dizziness, light-headedness, numbness and headaches.  Hematological: Negative for adenopathy. Does not bruise/bleed easily.  Psychiatric/Behavioral: Negative for suicidal ideas, confusion, sleep disturbance, self-injury, dysphoric mood, decreased concentration and agitation.       Objective:    BP 121/82 mmHg  Pulse 67  Temp(Src) 98.1 F (36.7 C) (Oral)  Ht '5\' 6"'$  (1.676 m)  Wt 158 lb (71.668 kg)  BMI 25.51 kg/m2  SpO2 99%  LMP 02/16/2016 General appearance: alert, cooperative, appears stated age and no distress Head: Normocephalic, without obvious abnormality, atraumatic Eyes: negative findings: lids and lashes normal, conjunctivae and sclerae normal and pupils equal, round, reactive to light and accomodation Ears: normal TM's and external ear canals both ears Nose: Nares normal. Septum midline. Mucosa normal. No drainage or sinus tenderness. Throat: lips, mucosa, and tongue normal; teeth and gums normal Neck: no adenopathy, no carotid bruit, no JVD, supple, symmetrical, trachea midline and thyroid not enlarged, symmetric, no tenderness/mass/nodules Back: symmetric, no curvature. ROM normal. No CVA tenderness. Lungs: clear to auscultation bilaterally Breasts: normal appearance, no masses or tenderness Heart: regular rate and rhythm, S1, S2 normal, no murmur, click, rub or gallop Abdomen: soft, non-tender; bowel sounds normal; no masses,  no organomegaly Pelvic: cervix normal in appearance, external genitalia normal, no adnexal masses or tenderness, no cervical motion tenderness, rectovaginal septum normal, uterus normal size, shape, and consistency, vagina normal without discharge and pap done Extremities: extremities normal, atraumatic, no cyanosis or edema Pulses: 2+ and symmetric Skin: Skin color, texture, turgor normal. No rashes or lesions Lymph nodes: Cervical, supraclavicular, and axillary nodes normal. Neurologic: Alert and oriented X 3, normal strength and  tone. Normal symmetric reflexes. Normal coordination and gait    Assessment:    Healthy female exam.      Plan:    ghm utd Check labs See After Visit Summary for Counseling Recommendations    1. Preventative health care See above - TSH; Future - POCT urinalysis dipstick; Future - Lipid panel; Future - CBC with Differential/Platelet; Future - Comprehensive metabolic panel; Future  2. Vitamin D deficiency   - Vitamin D (25 hydroxy); Future  3. Routine cervical smear   - Cytology - PAP  4. History of abnormal mammogram Pt to f/u with surgeon  - MM DIAG BREAST TOMO BILATERAL; Future

## 2016-03-16 ENCOUNTER — Other Ambulatory Visit (INDEPENDENT_AMBULATORY_CARE_PROVIDER_SITE_OTHER): Payer: Federal, State, Local not specified - PPO

## 2016-03-16 DIAGNOSIS — Z0000 Encounter for general adult medical examination without abnormal findings: Secondary | ICD-10-CM | POA: Diagnosis not present

## 2016-03-16 DIAGNOSIS — E559 Vitamin D deficiency, unspecified: Secondary | ICD-10-CM | POA: Diagnosis not present

## 2016-03-16 LAB — CBC WITH DIFFERENTIAL/PLATELET
Basophils Absolute: 0 10*3/uL (ref 0.0–0.1)
Basophils Relative: 0.7 % (ref 0.0–3.0)
Eosinophils Absolute: 0.1 10*3/uL (ref 0.0–0.7)
Eosinophils Relative: 2 % (ref 0.0–5.0)
HCT: 40.5 % (ref 36.0–46.0)
Hemoglobin: 13.4 g/dL (ref 12.0–15.0)
Lymphocytes Relative: 54.7 % — ABNORMAL HIGH (ref 12.0–46.0)
Lymphs Abs: 3.1 10*3/uL (ref 0.7–4.0)
MCHC: 33.2 g/dL (ref 30.0–36.0)
MCV: 84.9 fl (ref 78.0–100.0)
Monocytes Absolute: 0.4 10*3/uL (ref 0.1–1.0)
Monocytes Relative: 6.3 % (ref 3.0–12.0)
Neutro Abs: 2 10*3/uL (ref 1.4–7.7)
Neutrophils Relative %: 36.3 % — ABNORMAL LOW (ref 43.0–77.0)
Platelets: 221 10*3/uL (ref 150.0–400.0)
RBC: 4.77 Mil/uL (ref 3.87–5.11)
RDW: 13.7 % (ref 11.5–15.5)
WBC: 5.6 10*3/uL (ref 4.0–10.5)

## 2016-03-16 LAB — COMPREHENSIVE METABOLIC PANEL
ALT: 13 U/L (ref 0–35)
AST: 17 U/L (ref 0–37)
Albumin: 4.1 g/dL (ref 3.5–5.2)
Alkaline Phosphatase: 33 U/L — ABNORMAL LOW (ref 39–117)
BUN: 13 mg/dL (ref 6–23)
CO2: 26 mEq/L (ref 19–32)
Calcium: 8.8 mg/dL (ref 8.4–10.5)
Chloride: 104 mEq/L (ref 96–112)
Creatinine, Ser: 0.73 mg/dL (ref 0.40–1.20)
GFR: 108.98 mL/min (ref >60.00)
Glucose, Bld: 79 mg/dL (ref 70–99)
Potassium: 4.2 mEq/L (ref 3.5–5.1)
Sodium: 137 mEq/L (ref 135–145)
Total Bilirubin: 0.6 mg/dL (ref 0.2–1.2)
Total Protein: 6.9 g/dL (ref 6.0–8.3)

## 2016-03-16 LAB — CYTOLOGY - PAP

## 2016-03-16 LAB — LIPID PANEL
Cholesterol: 144 mg/dL (ref 0–200)
HDL: 52.9 mg/dL (ref >39.00)
LDL Cholesterol: 82 mg/dL (ref 0–99)
NonHDL: 91.54
Total CHOL/HDL Ratio: 3
Triglycerides: 48 mg/dL (ref 0.0–149.0)
VLDL: 9.6 mg/dL (ref 0.0–40.0)

## 2016-03-16 LAB — VITAMIN D 25 HYDROXY (VIT D DEFICIENCY, FRACTURES): VITD: 70.19 ng/mL (ref 30.00–100.00)

## 2016-03-16 LAB — TSH: TSH: 1.89 u[IU]/mL (ref 0.35–4.50)

## 2016-03-18 ENCOUNTER — Telehealth: Payer: Self-pay | Admitting: Family Medicine

## 2016-03-18 NOTE — Telephone Encounter (Signed)
Attempted to reach pt re: pap smear result and left detailed message on cell# and to call and schedule pap smear in 6 months. See below results:  Notes Recorded by [name], DO on 03/16/2016 at 2:31 PM No EC cells --- Repeat 6 months

## 2016-03-18 NOTE — Telephone Encounter (Signed)
Pt called in returning cma's call.

## 2016-05-13 ENCOUNTER — Ambulatory Visit
Admission: RE | Admit: 2016-05-13 | Discharge: 2016-05-13 | Disposition: A | Discharge status: Home / Self Care | Payer: Federal, State, Local not specified - PPO | Source: Ambulatory Visit | Attending: Family Medicine | Admitting: Family Medicine

## 2016-05-13 DIAGNOSIS — Z87898 Personal history of other specified conditions: Secondary | ICD-10-CM

## 2016-05-27 ENCOUNTER — Encounter: Payer: Self-pay | Admitting: Medical

## 2016-05-27 ENCOUNTER — Ambulatory Visit (HOSPITAL_BASED_OUTPATIENT_CLINIC_OR_DEPARTMENT_OTHER)
Admission: RE | Admit: 2016-05-27 | Discharge: 2016-05-27 | Disposition: A | Discharge status: Home / Self Care | Payer: Federal, State, Local not specified - PPO | Source: Ambulatory Visit | Attending: Medical | Admitting: Medical

## 2016-05-27 ENCOUNTER — Ambulatory Visit (INDEPENDENT_AMBULATORY_CARE_PROVIDER_SITE_OTHER): Payer: Federal, State, Local not specified - PPO | Admitting: Medical

## 2016-05-27 VITALS — BP 138/83 | HR 74 | Temp 98.5°F | Ht 66.0 in | Wt 157.8 lb

## 2016-05-27 DIAGNOSIS — R0781 Pleurodynia: Secondary | ICD-10-CM | POA: Insufficient documentation

## 2016-05-27 DIAGNOSIS — R0789 Other chest pain: Secondary | ICD-10-CM

## 2016-05-27 DIAGNOSIS — R142 Eructation: Secondary | ICD-10-CM

## 2016-05-27 DIAGNOSIS — K219 Gastro-esophageal reflux disease without esophagitis: Secondary | ICD-10-CM

## 2016-05-27 DIAGNOSIS — R1013 Epigastric pain: Secondary | ICD-10-CM

## 2016-05-27 MED ORDER — RANITIDINE HCL 150 MG PO CAPS: 150.0000 mg | ORAL_CAPSULE | Freq: Two times a day (BID) | ORAL | 0 refills | Status: DC

## 2016-05-27 NOTE — Patient Instructions (Addendum)
Will get chest xray with rib view for intermittent rt upper rib pain.  For abdomen pain occasionally ruq will get Abdomen US.  For possible reflux rx ranitidine tab. For belching/gas can use gas-x.   For intermittent refux and abd pain will get future cbc, cmp, amylase, lipase and h pylori. Test will be done tomorrow stat.  ekg done today since your report occasional upper chest burn. This is more reflux type complaint. You belched as you described. Ekg appears normal. I want you to eat healthy and start ranitidine. If you pain changes in features. More constant or severe with other associated symptoms then ED evaluation.  Also want you to keep checking for any rash on your skin as shingle is possible diagnosis but doubt presently. In event of rash in rt upper rib area or abdomen rt side let us know immediatlley  Follow up in 7 days or as needed

## 2016-05-27 NOTE — Progress Notes (Signed)
Pre visit review using our clinic tool,if applicable. No additional management support is needed unless otherwise documented below in the visit note.  

## 2016-05-27 NOTE — Progress Notes (Signed)
Subjective:    Patient ID: Nancy [name], female    DOB: 1965-11-19, 50 y.o.   MRN: [id number]  HPI  Pt in with pain that was sharp in her rt upper rib area. She first noticed some rt rib pain at night. Pain woke her up at night 3 weeks ago. Pain lasted for about 20 minutes. Not worse with breathing. She thought pain may have been positional. Laying on rt side pain was worse. Pain since then has occurred randomly and mostly at night. No pain presently. No pain on deep inspiration.  Pt will have rt upper rib pain almost randomly about every other night. Pain will improve with position change and if takes gas-x.  She also states a lot of belching over past month. Pt mentions sometimes pain rt rib area moves to her ruq area.Maybe occasional pain rt side thorax(but if she does rare in this area).   Pt mentioned yesterday after eatng humus felt onset of ruq and luq pain. Very uncomfortable.  LMP- Sept 14 th.  Pt has not had any rash at all to her skin.  Pt faint heart burn sensation upper chest. Chicken nuggets and fries today. Denies chest pain. Then she burped after description  She notes rib pain above sometimes seems to settle in rt lower rib/ruq area.     Review of Systems  Constitutional: Negative for chills, fatigue and fever.  HENT: Negative for congestion.   Respiratory: Negative for cough, chest tightness, shortness of breath and wheezing.   Cardiovascular: Negative for chest pain and palpitations.  Gastrointestinal: Positive for abdominal pain. Negative for blood in stool, constipation, diarrhea, nausea and vomiting.       No know but see hpi.  Genitourinary: Negative for dysuria, flank pain, frequency and hematuria.  Musculoskeletal: Negative for back pain, gait problem, neck pain and neck stiffness.       See hpi.  Skin: Negative for rash.  Neurological: Negative for dizziness and headaches.  Hematological: Negative for adenopathy. Does not bruise/bleed easily.    Psychiatric/Behavioral: Negative for behavioral problems and confusion.    Past Medical History:  Diagnosis Date  . Allergy    SEASONAL  . Anemia    HIGH SCHOOL  . Atopic eczema   . Fibrocystic breast disease   . GERD (gastroesophageal reflux disease)   . TIA (transient ischemic attack)      Social History   Social History  . Marital status: Married    Spouse name: N/A  . Number of children: 0  . Years of education: 59   Occupational History  . [employer]  .  [employer]   Social History Main Topics  . Smoking status: Never Smoker  . Smokeless tobacco: Never Used  . Alcohol use Yes     Comment: maybe 3 times a year  . Drug use: No  . Sexual activity: Yes    Partners: Male   Other Topics Concern  . Not on file   Social History Narrative  . No narrative on file    Past Surgical History:  Procedure Laterality Date  . MYOMECTOMY  2002    Family History  Problem Relation Age of Onset  . Gout Mother   . Diabetes Mother   . Hypertension Father   . Diabetes Father   . Breast cancer Paternal Aunt   . Thyroid disease Neg Hx     Allergies  Allergen Reactions  . Penicillins Anaphylaxis    Has patient had  a PCN reaction causing immediate rash, facial/tongue/throat swelling, SOB or lightheadedness with hypotensionYes Swelling in throat  Has patient had a PCN reaction causing severe rash involving mucus membranes or skin necrosis:/No Has patient had a PCN reaction that required hospitalization/No Has patient had a PCN reaction occurring within the last 10 years:NO If all of the above answers are "NO", then may proceed with Cephalosporin use.   . Lipitor [Atorvastatin] Palpitations    Tingling, flushing    Current Outpatient Prescriptions on File Prior to Visit  Medication Sig Dispense Refill  . aspirin 81 MG tablet Take 81 mg by mouth daily.    . cetirizine (ZYRTEC) 10 MG tablet Take 10 mg by mouth daily as needed. For allergies     .  Cholecalciferol (VITAMIN D3) 5000 UNITS CAPS Take 1 capsule by mouth daily.    . Ciclopirox 1 % shampoo Use as directed 2x a week for up to 4 weeks 120 mL 1  . doxycycline (VIBRA-TABS) 100 MG tablet Take 1 tablet (100 mg total) by mouth 2 (two) times daily. 20 tablet 0  . meclizine (ANTIVERT) 50 MG tablet Take 1 tablet (50 mg total) by mouth 3 (three) times daily as needed. 30 tablet 0  . mometasone (ELOCON) 0.1 % lotion Apply topically daily. 60 mL 0  . NON FORMULARY 1 capsule 1 day or 1 dose. Pregnelone, pt reports she takes daily.    . Nutritional Supplements (DHEA) 15-50 MG CAPS Take 1 capsule by mouth 3 (three) times a week.    . ondansetron (ZOFRAN) 4 MG tablet Take 1 tablet (4 mg total) by mouth every 6 (six) hours. 12 tablet 0  . Prenatal Vit-Fe Fumarate-FA (PRENATAL MULTIVITAMIN) TABS tablet Take 2 tablets by mouth daily at 12 noon.    Marland Kitchen VITAMIN K PO Take 1 capsule by mouth daily.     No current facility-administered medications on file prior to visit.     BP 138/83   Pulse 74   Temp 98.5 F (36.9 C) (Oral)   Ht 5\' 6"  (1.676 m)   Wt 157 lb 12.8 oz (71.6 kg)   LMP 05/13/2016   SpO2 100%   BMI 25.47 kg/m       Objective:   Physical Exam   General- No acute distress. Pleasant patient. Neck- Full range of motion, no jvd Lungs- Clear, even and unlabored. Heart- regular rate and rhythm. Neurologic- CNII- XII grossly intact.  Abdomen Inspection:-Inspection Normal.  Palpation/Perucssion: Palpation and Percussion of the abdomen reveal- faint epigastric Tender, No Rebound tenderness, No rigidity(Guarding) and No Palpable abdominal masses.  Liver:-Normal.  Spleen:- Normal.   Back-no cva pain.        Assessment & Plan:  Will get chest xray with rib view for intermittent rt upper rib pain.  For abdomen pain occasionally ruq will get Abdomen US.  For possible reflux rx ranitidine tab. For belching/gas can use gas-x.   For intermittent refux and abd pain will get  future cbc, cmp, amylase, lipase and h pylori. Test will be done tomorrow stat.  ekg done today since your report occasional upper chest burn. This is more reflux type complaint. You belched as you described. Ekg appears normal. I want you to eat healthy and start ranitidine. If you pain changes in features. More constant or severe with other associated symptoms then ED evaluation.  Also want you to keep checking for any rash on your skin as shingle is possilble diagnosis but doubt presently.In event of rash in  rt upper rib area or abdomen rt side let us know immediatlley  Follow up in 7 days or as needed  Yashas Camilli, Ramon Dredge, [employer]

## 2016-05-28 ENCOUNTER — Other Ambulatory Visit: Payer: Federal, State, Local not specified - PPO

## 2016-05-28 DIAGNOSIS — R1013 Epigastric pain: Secondary | ICD-10-CM

## 2016-05-28 LAB — COMPREHENSIVE METABOLIC PANEL
ALT: 14 U/L (ref 6–29)
AST: 18 U/L (ref 10–35)
Albumin: 4.7 g/dL (ref 3.6–5.1)
Alkaline Phosphatase: 35 U/L (ref 33–115)
BUN: 12 mg/dL (ref 7–25)
CO2: 24 mmol/L (ref 20–31)
Calcium: 9 mg/dL (ref 8.6–10.2)
Chloride: 105 mmol/L (ref 98–110)
Creat: 0.75 mg/dL (ref 0.50–1.10)
Glucose, Bld: 93 mg/dL (ref 65–99)
Potassium: 4 mmol/L (ref 3.5–5.3)
Sodium: 138 mmol/L (ref 135–146)
Total Bilirubin: 0.5 mg/dL (ref 0.2–1.2)
Total Protein: 7.2 g/dL (ref 6.1–8.1)

## 2016-05-28 LAB — CBC WITH DIFFERENTIAL/PLATELET
Basophils Absolute: 0 cells/uL (ref 0–200)
Basophils Relative: 0 %
Eosinophils Absolute: 122 cells/uL (ref 15–500)
Eosinophils Relative: 2 %
HCT: 39.2 % (ref 35.0–45.0)
Hemoglobin: 13.1 g/dL (ref 11.7–15.5)
Lymphocytes Relative: 54 %
Lymphs Abs: 3294 cells/uL (ref 850–3900)
MCH: 28.5 pg (ref 27.0–33.0)
MCHC: 33.4 g/dL (ref 32.0–36.0)
MCV: 85.2 fL (ref 80.0–100.0)
MPV: 9.4 fL (ref 7.5–12.5)
Monocytes Absolute: 366 cells/uL (ref 200–950)
Monocytes Relative: 6 %
Neutro Abs: 2318 cells/uL (ref 1500–7800)
Neutrophils Relative %: 38 %
Platelets: 255 10*3/uL (ref 140–400)
RBC: 4.6 MIL/uL (ref 3.80–5.10)
RDW: 13.9 % (ref 11.0–15.0)
WBC: 6.1 10*3/uL (ref 3.8–10.8)

## 2016-05-28 LAB — AMYLASE: Amylase: 82 U/L (ref 0–105)

## 2016-05-28 LAB — LIPASE: Lipase: 31 U/L (ref 7–60)

## 2016-05-28 NOTE — Addendum Note (Signed)
Addended by: Caffie Pinto on: 05/28/2016 03:55 PM   Modules accepted: Orders

## 2016-05-28 NOTE — Addendum Note (Signed)
Addended by: Caffie Pinto on: 05/28/2016 03:54 PM   Modules accepted: Orders

## 2016-05-31 LAB — H. PYLORI BREATH TEST: H. pylori Breath Test: NOT DETECTED

## 2016-06-21 ENCOUNTER — Other Ambulatory Visit: Payer: Self-pay

## 2016-06-21 ENCOUNTER — Encounter (HOSPITAL_BASED_OUTPATIENT_CLINIC_OR_DEPARTMENT_OTHER): Payer: Self-pay

## 2016-06-21 ENCOUNTER — Ambulatory Visit (INDEPENDENT_AMBULATORY_CARE_PROVIDER_SITE_OTHER): Payer: Federal, State, Local not specified - PPO | Admitting: Family Medicine

## 2016-06-21 ENCOUNTER — Encounter: Payer: Self-pay | Admitting: Family Medicine

## 2016-06-21 ENCOUNTER — Ambulatory Visit (HOSPITAL_BASED_OUTPATIENT_CLINIC_OR_DEPARTMENT_OTHER)
Admission: RE | Admit: 2016-06-21 | Discharge: 2016-06-21 | Disposition: A | Discharge status: Home / Self Care | Payer: Federal, State, Local not specified - PPO | Source: Ambulatory Visit | Attending: Family Medicine | Admitting: Family Medicine

## 2016-06-21 VITALS — BP 116/72 | HR 80 | Temp 99.0°F | Resp 16 | Ht 66.0 in | Wt 156.4 lb

## 2016-06-21 DIAGNOSIS — R1011 Right upper quadrant pain: Secondary | ICD-10-CM

## 2016-06-21 DIAGNOSIS — R1013 Epigastric pain: Secondary | ICD-10-CM | POA: Diagnosis not present

## 2016-06-21 MED ORDER — OMEPRAZOLE 40 MG PO CPDR: 40.0000 mg | DELAYED_RELEASE_CAPSULE | Freq: Every day | ORAL | 3 refills | Status: DC

## 2016-06-21 NOTE — Progress Notes (Signed)
Patient ID: Nancy [name], female    DOB: September 19, 1965  Age: 50 y.o. MRN: [id number]    Subjective:  Subjective  HPI Nancy [name] presents for f/u mid and right upper abd pain.   meds given are not helping   Review of Systems  Constitutional: Negative for activity change, appetite change, fatigue and unexpected weight change.  Respiratory: Negative for cough and shortness of breath.   Cardiovascular: Negative for chest pain and palpitations.  Gastrointestinal: Positive for abdominal distention and abdominal pain.  Psychiatric/Behavioral: Negative for behavioral problems and dysphoric mood. The patient is not nervous/anxious.     History Past Medical History:  Diagnosis Date  . Allergy    SEASONAL  . Anemia    [employer]  . Atopic eczema   . Fibrocystic breast disease   . GERD (gastroesophageal reflux disease)   . TIA (transient ischemic attack)     She has a past surgical history that includes Myomectomy (2002).   Her family history includes Breast cancer in her paternal aunt; Diabetes in her father and mother; Gout in her mother; Hypertension in her father.She reports that she has never smoked. She has never used smokeless tobacco. She reports that she drinks alcohol. She reports that she does not use drugs.  Current Outpatient Prescriptions on File Prior to Visit  Medication Sig Dispense Refill  . aspirin 81 MG tablet Take 81 mg by mouth daily.    . cetirizine (ZYRTEC) 10 MG tablet Take 10 mg by mouth daily as needed. For allergies     . Cholecalciferol (VITAMIN D3) 5000 UNITS CAPS Take 1 capsule by mouth daily.    . Ciclopirox 1 % shampoo Use as directed 2x a week for up to 4 weeks 120 mL 1  . doxycycline (VIBRA-TABS) 100 MG tablet Take 1 tablet (100 mg total) by mouth 2 (two) times daily. 20 tablet 0  . meclizine (ANTIVERT) 50 MG tablet Take 1 tablet (50 mg total) by mouth 3 (three) times daily as needed. 30 tablet 0  . mometasone (ELOCON) 0.1 % lotion Apply  topically daily. 60 mL 0  . NON FORMULARY 1 capsule 1 day or 1 dose. Pregnelone, pt reports she takes daily.    . Nutritional Supplements (DHEA) 15-50 MG CAPS Take 1 capsule by mouth 3 (three) times a week.    . Prenatal Vit-Fe Fumarate-FA (PRENATAL MULTIVITAMIN) TABS tablet Take 2 tablets by mouth daily at 12 noon.    . ranitidine (ZANTAC) 150 MG capsule Take 1 capsule (150 mg total) by mouth 2 (two) times daily. 60 capsule 0  . VITAMIN K PO Take 1 capsule by mouth daily.    . ondansetron (ZOFRAN) 4 MG tablet Take 1 tablet (4 mg total) by mouth every 6 (six) hours. (Patient not taking: Reported on 06/21/2016) 12 tablet 0   No current facility-administered medications on file prior to visit.      Objective:  Objective  Physical Exam  Constitutional: She is oriented to person, place, and time. She appears well-developed and well-nourished.  HENT:  Head: Normocephalic and atraumatic.  Eyes: Conjunctivae and EOM are normal.  Neck: Normal range of motion. Neck supple. No JVD present. Carotid bruit is not present. No thyromegaly present.  Cardiovascular: Normal rate, regular rhythm and normal heart sounds.   No murmur heard. Pulmonary/Chest: Effort normal and breath sounds normal. No respiratory distress. She has no wheezes. She has no rales. She exhibits no tenderness.  Abdominal: Soft. Bowel sounds are normal. She exhibits no  distension. There is tenderness in the right upper quadrant and epigastric area. There is guarding. There is no rebound.  Musculoskeletal: She exhibits no edema.  Neurological: She is alert and oriented to person, place, and time.  Psychiatric: She has a normal mood and affect.  Nursing note and vitals reviewed.  BP 116/72 (BP Location: Left Arm, Patient Position: Sitting, Cuff Size: Normal)   Pulse 80   Temp 99 F (37.2 C) (Oral)   Resp 16   Ht '5\' 6"'$  (1.676 m)   Wt 156 lb 6.4 oz (70.9 kg)   LMP 05/13/2016   SpO2 98%   BMI 25.24 kg/m  Wt Readings from Last 3  Encounters:  06/21/16 156 lb 6.4 oz (70.9 kg)  05/27/16 157 lb 12.8 oz (71.6 kg)  03/12/16 158 lb (71.7 kg)     Lab Results  Component Value Date   WBC 6.1 05/28/2016   HGB 13.1 05/28/2016   HCT 39.2 05/28/2016   PLT 255 05/28/2016   GLUCOSE 93 05/28/2016   CHOL 144 03/16/2016   TRIG 48.0 03/16/2016   HDL 52.90 03/16/2016   LDLCALC 82 03/16/2016   ALT 14 05/28/2016   AST 18 05/28/2016   NA 138 05/28/2016   K 4.0 05/28/2016   CL 105 05/28/2016   CREATININE 0.75 05/28/2016   BUN 12 05/28/2016   CO2 24 05/28/2016   TSH 1.89 03/16/2016   INR 1.09 06/09/2015   HGBA1C 5.7 (H) 06/10/2015    Dg Ribs Unilateral W/chest Right  Result Date: 05/27/2016 CLINICAL DATA:  Right-sided rib pain. EXAM: RIGHT RIBS AND CHEST - 3+ VIEW COMPARISON:  None. FINDINGS: No fracture or other bone lesions are seen involving the ribs. There is no evidence of pneumothorax or pleural effusion. Both lungs are clear. Heart size and mediastinal contours are within normal limits. IMPRESSION: Negative. Electronically Signed   By: Fidela Salisbury M.D.   On: 05/27/2016 19:17   US Abdomen Complete  Result Date: 05/27/2016 CLINICAL DATA:  Right upper quadrant and epigastric pain for 3 weeks. EXAM: ABDOMEN ULTRASOUND COMPLETE COMPARISON:  None. FINDINGS: Gallbladder: No gallstones or wall thickening visualized. No sonographic Murphy sign noted by sonographer. Common bile duct: Diameter: 2.0 mm Liver: Normal echogenicity without focal lesion or biliary dilatation. IVC: Normal caliber Pancreas: Sonographically normal Spleen: Normal size and echogenicity without focal lesions. Right Kidney: Length: 11.0 cm. Normal renal cortical thickness and echogenicity without focal lesions or hydronephrosis. Left Kidney: Length: 9.8 cm. Normal renal cortical thickness and echogenicity without focal lesions or hydronephrosis. Abdominal aorta: Normal caliber Other findings: No ascites IMPRESSION: Normal abdominal ultrasound  examination. Electronically Signed   By: Marijo Sanes M.D.   On: 05/27/2016 19:33     Assessment & Plan:  Plan  I am having [name] start on omeprazole. I am also having her maintain her cetirizine, Vitamin D3, VITAMIN K PO, prenatal multivitamin, aspirin, Ciclopirox, mometasone, meclizine, ondansetron, DHEA, NON FORMULARY, doxycycline, and ranitidine.  Meds ordered this encounter  Medications  . omeprazole (PRILOSEC) 40 MG capsule    Sig: Take 1 capsule (40 mg total) by mouth daily.    Dispense:  30 capsule    Refill:  3    Problem List Items Addressed This Visit    None    Visit Diagnoses    RUQ pain    -  Primary   Dyspepsia       Relevant Medications   omeprazole (PRILOSEC) 40 MG capsule    labs and [place] reviewed  from previous visit.  Gi cocktail given with relief.    Follow-up: No Follow-up on file.  [name], DO

## 2016-06-21 NOTE — Progress Notes (Signed)
Pre visit review using our clinic review tool, if applicable. No additional management support is needed unless otherwise documented below in the visit note. 

## 2016-06-21 NOTE — Patient Instructions (Signed)
Food Choices for Gastroesophageal Reflux Disease, Adult When you have gastroesophageal reflux disease (GERD), the foods you eat and your eating habits are very important. Choosing the right foods can help ease the discomfort of GERD. WHAT GENERAL GUIDELINES DO I NEED TO FOLLOW?  Choose fruits, vegetables, whole grains, low-fat dairy products, and low-fat meat, fish, and poultry.  Limit fats such as oils, salad dressings, butter, nuts, and avocado.  Keep a food diary to identify foods that cause symptoms.  Avoid foods that cause reflux. These may be different for different people.  Eat frequent small meals instead of three large meals each day.  Eat your meals slowly, in a relaxed setting.  Limit fried foods.  Cook foods using methods other than frying.  Avoid drinking alcohol.  Avoid drinking large amounts of liquids with your meals.  Avoid bending over or lying down until 2-3 hours after eating. WHAT FOODS ARE NOT RECOMMENDED? The following are some foods and drinks that may worsen your symptoms: Vegetables Tomatoes. Tomato juice. Tomato and spaghetti sauce. Chili peppers. Onion and garlic. Horseradish. Fruits Oranges, grapefruit, and lemon (fruit and juice). Meats High-fat meats, fish, and poultry. This includes hot dogs, ribs, ham, sausage, salami, and bacon. Dairy Whole milk and chocolate milk. Sour cream. Cream. Butter. Ice cream. Cream cheese.  Beverages Coffee and tea, with or without caffeine. Carbonated beverages or energy drinks. Condiments Hot sauce. Barbecue sauce.  Sweets/Desserts Chocolate and cocoa. Donuts. Peppermint and spearmint. Fats and Oils High-fat foods, including French fries and potato chips. Other Vinegar. Strong spices, such as black pepper, white pepper, red pepper, cayenne, curry powder, cloves, ginger, and chili powder. The items listed above may not be a complete list of foods and beverages to avoid. Contact your dietitian for more  information.   This information is not intended to replace advice given to you by your health care provider. Make sure you discuss any questions you have with your health care provider.   Document Released: 08/16/2005 Document Revised: 09/06/2014 Document Reviewed: 06/20/2013 Elsevier Interactive Patient Education 2016 [employer] Inc.  

## 2016-06-22 ENCOUNTER — Emergency Department (HOSPITAL_BASED_OUTPATIENT_CLINIC_OR_DEPARTMENT_OTHER)
Admission: EM | Admit: 2016-06-22 | Discharge: 2016-06-22 | Disposition: A | Discharge status: Home / Self Care | Payer: Federal, State, Local not specified - PPO | Attending: Emergency Medicine | Admitting: Emergency Medicine

## 2016-06-22 ENCOUNTER — Telehealth: Payer: Self-pay | Admitting: Family Medicine

## 2016-06-22 ENCOUNTER — Encounter (HOSPITAL_BASED_OUTPATIENT_CLINIC_OR_DEPARTMENT_OTHER): Payer: Self-pay | Admitting: Emergency Medicine

## 2016-06-22 ENCOUNTER — Ambulatory Visit: Payer: Federal, State, Local not specified - PPO | Admitting: Family Medicine

## 2016-06-22 DIAGNOSIS — Z79899 Other long term (current) drug therapy: Secondary | ICD-10-CM | POA: Insufficient documentation

## 2016-06-22 DIAGNOSIS — R1013 Epigastric pain: Secondary | ICD-10-CM | POA: Diagnosis present

## 2016-06-22 DIAGNOSIS — K2971 Gastritis, unspecified, with bleeding: Secondary | ICD-10-CM | POA: Diagnosis not present

## 2016-06-22 DIAGNOSIS — K219 Gastro-esophageal reflux disease without esophagitis: Secondary | ICD-10-CM | POA: Diagnosis not present

## 2016-06-22 DIAGNOSIS — K2970 Gastritis, unspecified, without bleeding: Secondary | ICD-10-CM

## 2016-06-22 DIAGNOSIS — Z7982 Long term (current) use of aspirin: Secondary | ICD-10-CM | POA: Diagnosis not present

## 2016-06-22 LAB — COMPREHENSIVE METABOLIC PANEL
ALT: 15 U/L (ref 14–54)
AST: 20 U/L (ref 15–41)
Albumin: 4.4 g/dL (ref 3.5–5.0)
Alkaline Phosphatase: 39 U/L (ref 38–126)
Anion gap: 7 (ref 5–15)
BUN: 9 mg/dL (ref 6–20)
CO2: 27 mmol/L (ref 22–32)
Calcium: 9.2 mg/dL (ref 8.9–10.3)
Chloride: 105 mmol/L (ref 101–111)
Creatinine, Ser: 0.75 mg/dL (ref 0.44–1.00)
GFR calc Af Amer: >60 mL/min (ref >60)
GFR calc non Af Amer: >60 mL/min (ref >60)
Glucose, Bld: 119 mg/dL — ABNORMAL HIGH (ref 65–99)
Potassium: 3.6 mmol/L (ref 3.5–5.1)
Sodium: 139 mmol/L (ref 135–145)
Total Bilirubin: 0.5 mg/dL (ref 0.3–1.2)
Total Protein: 7.5 g/dL (ref 6.5–8.1)

## 2016-06-22 LAB — CBC WITH DIFFERENTIAL/PLATELET
Basophils Absolute: 0 10*3/uL (ref 0.0–0.1)
Basophils Relative: 0 %
Eosinophils Absolute: 0.1 10*3/uL (ref 0.0–0.7)
Eosinophils Relative: 1 %
HCT: 42 % (ref 36.0–46.0)
Hemoglobin: 14.2 g/dL (ref 12.0–15.0)
Lymphocytes Relative: 51 %
Lymphs Abs: 3.1 10*3/uL (ref 0.7–4.0)
MCH: 28.4 pg (ref 26.0–34.0)
MCHC: 33.8 g/dL (ref 30.0–36.0)
MCV: 84 fL (ref 78.0–100.0)
Monocytes Absolute: 0.3 10*3/uL (ref 0.1–1.0)
Monocytes Relative: 5 %
Neutro Abs: 2.6 10*3/uL (ref 1.7–7.7)
Neutrophils Relative %: 43 %
Platelets: 245 10*3/uL (ref 150–400)
RBC: 5 MIL/uL (ref 3.87–5.11)
RDW: 12.9 % (ref 11.5–15.5)
WBC: 6.1 10*3/uL (ref 4.0–10.5)

## 2016-06-22 LAB — URINALYSIS, ROUTINE W REFLEX MICROSCOPIC
Bilirubin Urine: NEGATIVE
Glucose, UA: NEGATIVE mg/dL
Hgb urine dipstick: NEGATIVE
Ketones, ur: NEGATIVE mg/dL
Leukocytes, UA: NEGATIVE
Nitrite: NEGATIVE
Protein, ur: NEGATIVE mg/dL
Specific Gravity, Urine: 1.003 — ABNORMAL LOW (ref 1.005–1.030)
pH: 7.5 (ref 5.0–8.0)

## 2016-06-22 LAB — PREGNANCY, URINE: Preg Test, Ur: NEGATIVE

## 2016-06-22 LAB — LIPASE, BLOOD: Lipase: 24 U/L (ref 11–51)

## 2016-06-22 MED ORDER — MORPHINE SULFATE (PF) 4 MG/ML IV SOLN
4.0000 mg | Freq: Once | INTRAVENOUS | Status: AC
  Administered 2016-06-22: 4 mg via INTRAVENOUS
  Filled 2016-06-22: qty 1

## 2016-06-22 MED ORDER — SODIUM CHLORIDE 0.9 % IV BOLUS (SEPSIS)
1000.0000 mL | Freq: Once | INTRAVENOUS | Status: AC
  Administered 2016-06-22: 1000 mL via INTRAVENOUS

## 2016-06-22 MED ORDER — FAMOTIDINE IN NACL 20-0.9 MG/50ML-% IV SOLN
20.0000 mg | Freq: Two times a day (BID) | INTRAVENOUS | Status: DC
  Administered 2016-06-22: 20 mg via INTRAVENOUS
  Filled 2016-06-22: qty 50

## 2016-06-22 MED ORDER — HYDROCODONE-ACETAMINOPHEN 5-325 MG PO TABS: 1.0000 | ORAL_TABLET | Freq: Four times a day (QID) | ORAL | 0 refills | Status: DC | PRN

## 2016-06-22 MED ORDER — GI COCKTAIL ~~LOC~~
30.0000 mL | Freq: Once | ORAL | Status: AC
  Administered 2016-06-22: 30 mL via ORAL
  Filled 2016-06-22: qty 30

## 2016-06-22 NOTE — Telephone Encounter (Signed)
Per the patient's chart, she's currently at the ED.

## 2016-06-22 NOTE — Telephone Encounter (Signed)
No!! That could make it worse.     Can take the omeprazole bid if needed Take tylenol  Gi referral is in ---  Let us know if that does not work and we can add something else

## 2016-06-22 NOTE — ED Provider Notes (Signed)
[facility] Provider Note   CSN: [id number] Arrival date & time: 06/22/16  0941     History   Chief Complaint Chief Complaint  Patient presents with  . Abdominal Pain    HPI Nancy [name] is a 50 y.o. female with a PMHx of anemia, eczema, fibrocystic breast disease, GERD, TIA, vestibular migraine, hyperthyroidism, and PSHxof myomectomy, who presents to the ED with complaints of ongoing right upper quadrant and epigastric abdominal pain x1 month that has gradually worsened over last 1 week. She was seen by her PCP on 05/27/16, had an unremarkable EKG and right rib chest x-ray as well as an unremarkable abdominal ultrasound and CBC/CMP/H. pylori testing was WNL. She was seen again yesterday by her PCP, given a GI cocktail which helped, and was scheduled for a GI consultation for possible endoscopy tomorrow at 10:30 AM. She was started on Prilosec yesterday which she started taking yesterday and this morning. She describes her pain as 8/10 constant crampy/colicky right upper quadrant and epigastric abdominal pain radiating occasionally into her right shoulder, worse with laying on her left side, improved somewhat with the GI cocktail yesterday, and unrelieved with ranitidine, famotidine, and Maalox. Associated symptoms includes belching/bloating/gas. She states that the GI cocktail yesterday helped mostly with the belching and bloating, and was very transient in helping with the pain.   She denies any fevers, chills, chest pain, shortness breath, nausea, vomiting, diarrhea, constipation, obstipation, melena, hematochezia, dysuria, hematuria, numbness, tingling, focal weakness, recent travel, sick contacts, suspicious food intake, alcohol use, or chronic NSAID use. LMP 05/13/16. She has never had any other abdominal surgeries aside from the myomectomy 14 years ago. Ate a banana at 11am.   The history is provided by the patient and medical records. No language interpreter was used.    Abdominal Pain   This is a recurrent problem. The current episode started more than 1 week ago. The problem occurs constantly. The problem has been gradually worsening. The pain is associated with an unknown factor. The pain is located in the epigastric region and RUQ. The quality of the pain is cramping and colicky. The pain is at a severity of 8/10. The pain is moderate. Associated symptoms include belching. Pertinent negatives include fever, diarrhea, flatus, hematochezia, melena, nausea, vomiting, constipation, dysuria, hematuria, arthralgias and myalgias. The symptoms are aggravated by certain positions. Past workup includes ultrasound. Her past medical history is significant for GERD.    Past Medical History:  Diagnosis Date  . Allergy    SEASONAL  . Anemia    [employer] SCHOOL  . Atopic eczema   . Fibrocystic breast disease   . GERD (gastroesophageal reflux disease)   . TIA (transient ischemic attack)     Patient Active Problem List   Diagnosis Date Noted  . Acute bronchitis 10/01/2015  . Vestibular migraine 06/20/2015  . TIA (transient ischemic attack) 06/12/2015  . Hyperlipidemia LDL goal <70 06/12/2015  . Diplopia 06/09/2015  . Vertigo 06/09/2015  . Right leg pain 04/12/2014  . Hyperthyroidism 03/13/2014  . MAMMOGRAM, ABNORMAL 10/31/2008  . ECZEMA, ATOPIC 10/15/2008  . FIBROCYSTIC BREAST DISEASE, HX OF 10/15/2008    Past Surgical History:  Procedure Laterality Date  . MYOMECTOMY  2002    OB History    No data available       Home Medications    Prior to Admission medications   Medication Sig Start Date End Date Taking? Authorizing Provider  aspirin 81 MG tablet Take 81 mg by mouth daily.  Yes Historical Provider, MD  cetirizine (ZYRTEC) 10 MG tablet Take 10 mg by mouth daily as needed. For allergies    Yes Historical Provider, MD  Cholecalciferol (VITAMIN D3) 5000 UNITS CAPS Take 1 capsule by mouth daily.   Yes Historical Provider, MD  omeprazole (PRILOSEC) 40  MG capsule Take 1 capsule (40 mg total) by mouth daily. 06/21/16  Yes Yvonne R Lowne Chase, DO  Ciclopirox 1 % shampoo Use as directed 2x a week for up to 4 weeks 08/21/15   Rosalita Chessman Chase, DO  doxycycline (VIBRA-TABS) 100 MG tablet Take 1 tablet (100 mg total) by mouth 2 (two) times daily. 03/05/16   Percell Miller Saguier, PA-C  meclizine (ANTIVERT) 50 MG tablet Take 1 tablet (50 mg total) by mouth 3 (three) times daily as needed. 08/29/15   Alferd Apa Lowne Chase, DO  mometasone (ELOCON) 0.1 % lotion Apply topically daily. 08/21/15   Alferd Apa Lowne Chase, DO  NON FORMULARY 1 capsule 1 day or 1 dose. Pregnelone, pt reports she takes daily.    Historical Provider, MD  Nutritional Supplements (DHEA) 15-50 MG CAPS Take 1 capsule by mouth 3 (three) times a week.    Historical Provider, MD  ondansetron (ZOFRAN) 4 MG tablet Take 1 tablet (4 mg total) by mouth every 6 (six) hours. Patient not taking: Reported on 06/21/2016 09/28/15   Comer Locket, PA-C  Prenatal Vit-Fe Fumarate-FA (PRENATAL MULTIVITAMIN) TABS tablet Take 2 tablets by mouth daily at 12 noon.    Historical Provider, MD  ranitidine (ZANTAC) 150 MG capsule Take 1 capsule (150 mg total) by mouth 2 (two) times daily. 05/27/16   Percell Miller Saguier, PA-C  VITAMIN K PO Take 1 capsule by mouth daily.    Historical Provider, MD    Family History Family History  Problem Relation Age of Onset  . Gout Mother   . Diabetes Mother   . Hypertension Father   . Diabetes Father   . Breast cancer Paternal Aunt   . Thyroid disease Neg Hx     Social History Social History  Substance Use Topics  . Smoking status: Never Smoker  . Smokeless tobacco: Never Used  . Alcohol use Yes     Comment: maybe 3 times a year     Allergies   Penicillins and Lipitor [atorvastatin]   Review of Systems Review of Systems  Constitutional: Negative for chills and fever.  Respiratory: Negative for shortness of breath.   Cardiovascular: Negative for chest pain.    Gastrointestinal: Positive for abdominal pain. Negative for blood in stool, constipation, diarrhea, flatus, hematochezia, melena, nausea and vomiting.       +bloating/belching  Genitourinary: Negative for dysuria and hematuria.  Musculoskeletal: Negative for arthralgias and myalgias.  Skin: Negative for color change.  Allergic/Immunologic: Negative for immunocompromised state.  Neurological: Negative for weakness and numbness.  Psychiatric/Behavioral: Negative for confusion.   10 Systems reviewed and are negative for acute change except as noted in the HPI.   Physical Exam Updated Vital Signs BP (!) 154/104 (BP Location: Left Arm)   Pulse 94   Temp 98 F (36.7 C) (Oral)   Resp 18   Ht '5\' 6"'$  (1.676 m)   Wt 69.4 kg   LMP 05/18/2016   SpO2 100%   BMI 24.69 kg/m   Physical Exam  Constitutional: She is oriented to person, place, and time. Vital signs are normal. She appears well-developed and well-nourished.  Non-toxic appearance. She appears distressed (uncomfortable appearing).  Afebrile, nontoxic, appears uncomfortable,  rocking back and forth in the bed, curled into a fetal position  HENT:  Head: Normocephalic and atraumatic.  Mouth/Throat: Oropharynx is clear and moist and mucous membranes are normal.  Eyes: Conjunctivae and EOM are normal. Right eye exhibits no discharge. Left eye exhibits no discharge.  Neck: Normal range of motion. Neck supple.  Cardiovascular: Normal rate, regular rhythm, normal heart sounds and intact distal pulses.  Exam reveals no gallop and no friction rub.   No murmur heard. Pulmonary/Chest: Effort normal and breath sounds normal. No respiratory distress. She has no decreased breath sounds. She has no wheezes. She has no rhonchi. She has no rales.  Abdominal: Soft. Normal appearance and bowel sounds are normal. She exhibits no distension. There is tenderness in the right upper quadrant and epigastric area. There is no rigidity, no rebound, no guarding,  no CVA tenderness, no tenderness at McBurney's point and negative Murphy's sign.  Soft, nondistended, +BS throughout, with moderate RUQ and epigastric TTP but neg murphy's, no r/g/r, neg mcburney's, no CVA TTP   Musculoskeletal: Normal range of motion.  Neurological: She is alert and oriented to person, place, and time. She has normal strength. No sensory deficit.  Skin: Skin is warm, dry and intact. No rash noted.  Psychiatric: She has a normal mood and affect.  Nursing note and vitals reviewed.    ED Treatments / Results  Labs (all labs ordered are listed, but only abnormal results are displayed) Labs Reviewed  URINALYSIS, ROUTINE W REFLEX MICROSCOPIC (NOT AT [facility]) - Abnormal; Notable for the following:       Result Value   Specific Gravity, Urine 1.003 (*)    All other components within normal limits  COMPREHENSIVE METABOLIC PANEL - Abnormal; Notable for the following:    Glucose, Bld 119 (*)    All other components within normal limits  PREGNANCY, URINE  CBC WITH DIFFERENTIAL/PLATELET  LIPASE, BLOOD   CBC/CMP/H.pylori breath testing 05/28/16: WNL/negative  EKG  EKG Interpretation None       Radiology No results found. Abd U/S 05/27/16 Study Result:  CLINICAL DATA:  Right upper quadrant and epigastric pain for 3 weeks.  EXAM: ABDOMEN ULTRASOUND COMPLETE  COMPARISON:  None.  FINDINGS: Gallbladder: No gallstones or wall thickening visualized. No sonographic Murphy sign noted by sonographer.  Common bile duct: Diameter: 2.0 mm  Liver: Normal echogenicity without focal lesion or biliary dilatation.  IVC: Normal caliber  Pancreas: Sonographically normal  Spleen: Normal size and echogenicity without focal lesions.  Right Kidney: Length: 11.0 cm. Normal renal cortical thickness and echogenicity without focal lesions or hydronephrosis.  Left Kidney: Length: 9.8 cm. Normal renal cortical thickness and echogenicity without focal lesions or  hydronephrosis.  Abdominal aorta: Normal caliber  Other findings: No ascites  IMPRESSION: Normal abdominal ultrasound examination.   Electronically Signed   By: Marijo Sanes M.D.   On: 05/27/2016 19:33   R rib CXR 05/27/16 Study Result:  CLINICAL DATA:  Right-sided rib pain.  EXAM: RIGHT RIBS AND CHEST - 3+ VIEW  COMPARISON:  None.  FINDINGS: No fracture or other bone lesions are seen involving the ribs. There is no evidence of pneumothorax or pleural effusion. Both lungs are clear. Heart size and mediastinal contours are within normal limits.  IMPRESSION: Negative.   Electronically Signed   By: Fidela Salisbury M.D.   On: 05/27/2016 19:17     Procedures Procedures (including critical care time)  Medications Ordered in ED Medications  famotidine (PEPCID) IVPB 20 mg premix (0  mg Intravenous Stopped 06/22/16 1315)  gi cocktail (Maalox,Lidocaine,Donnatal) (30 mLs Oral Given 06/22/16 1210)  morphine 4 MG/ML injection 4 mg (4 mg Intravenous Given 06/22/16 1210)  sodium chloride 0.9 % bolus 1,000 mL (0 mLs Intravenous Stopped 06/22/16 1314)     Initial Impression / Assessment and Plan / ED Course  I have reviewed the triage vital signs and the nursing notes.  Pertinent labs & imaging results that were available during my care of the patient were reviewed by me and considered in my medical decision making (see chart for details).  Clinical Course    50 y.o. female here with RUQ abd pain x1 month, worsening over last 1wk. Has had 2 PCP visits, and they ordered CBC/CMP/Hpylori testing which was neg, R rib CXR which was neg, EKG WNL, and abd u/s which was unremarkable. Started on ranitidine which didn't help, went yesterday and got GI cocktail which helped but temporarily; started on prilosec yesterday. Has appt with endoscopy consultation tomorrow. On exam, moderate RUQ and epigastric TTP but neg murphy's, nonperitoneal. Overall, seems like it could be  possibly biliary dyskinesia vs PUD/gastritis. Given recent U/S, will hold off on repeating this until after labs result; if LFTs abnormal then may consider getting repeat U/S today. Doubt need for CT abd. Will get CBC/CMP/lipase and give fluids, GI cocktail, morphine, and pepcid IV. U/A and Upreg neg. Will reassess shortly  2:58 PM  CBC w/diff WNL. CMP unremarkable. Lipase WNL. Pt feeling much better, pain has subsided. Tolerating PO. Discussed that biliary dyskinesia vs PUD vs gastritis/GERD are possible causes, will warrant further outpatient work up (i.e. Endoscopy and HIDA scan), which can be done by her gastroenterologist, f/up tomorrow for this visit with GI specialist for ongoing eval/management. Will rx norco for pain, discussed use of prilosec/pepcid, diet modifications discussed. I explained the diagnosis and have given explicit precautions to return to the ER including for any other new or worsening symptoms. The patient understands and accepts the medical plan as it's been dictated and I have answered their questions. Discharge instructions concerning home care and prescriptions have been given. The patient is STABLE and is discharged to home in good condition.   Final Clinical Impressions(s) / ED Diagnoses   Final diagnoses:  Epigastric abdominal pain  Gastroesophageal reflux disease, esophagitis presence not specified  Gastritis, presence of bleeding unspecified, unspecified chronicity, unspecified gastritis type    New Prescriptions New Prescriptions   HYDROCODONE-ACETAMINOPHEN (NORCO) 5-325 MG TABLET    Take 1 tablet by mouth every 6 (six) hours as needed for severe pain.     Leib Elahi Camprubi-Soms, PA-C 06/22/16 1500    Fredia Sorrow, MD 06/22/16 1546

## 2016-06-22 NOTE — Discharge Instructions (Signed)
Your abdominal pain is likely from gastritis or an ulcer, but could potentially be from gallbladder dysfunction. You will need to continue taking Pepcid and Prilosec as directed, and avoid spicy/fatty/acidic foods, avoid soda/coffee/tea/alcohol. Avoid laying down flat within 30 minutes of eating. Avoid NSAIDs like ibuprofen/aleve/motrin/etc on an empty stomach. May consider using over the counter tums/maalox as needed for additional relief. Use norco as directed as needed for pain but don't drive or operate machinery while taking this medication. This medication may cause constipation, take over the counter stool softeners like miralax or colace if you experience constipation. Always take with food. Follow up with your  gastroenterologist tomorrow at your already scheduled appointment at 10:30am for ongoing evaluation of your abdominal pain, and potentially for endoscopy and/or HIDA scan to further evaluate your symptoms. Return to the ER for changes or worsening symptoms.  Abdominal (belly) pain can be caused by many things. Your caregiver performed an examination and possibly ordered blood/urine tests and imaging (CT scan, x-rays, ultrasound). Many cases can be observed and treated at home after initial evaluation in the emergency department. Even though you are being discharged home, abdominal pain can be unpredictable. Therefore, you need a repeated exam if your pain does not resolve, returns, or worsens. Most patients with abdominal pain don't have to be admitted to the hospital or have surgery, but serious problems like appendicitis and gallbladder attacks can start out as nonspecific pain. Many abdominal conditions cannot be diagnosed in one visit, so follow-up evaluations are very important. SEEK IMMEDIATE MEDICAL ATTENTION IF YOU DEVELOP ANY OF THE FOLLOWING SYMPTOMS: The pain does not go away or becomes severe.  A temperature above 101 develops.  Repeated vomiting occurs (multiple episodes).  The  pain becomes localized to portions of the abdomen. The right side could possibly be appendicitis. In an adult, the left lower portion of the abdomen could be colitis or diverticulitis.  Blood is being passed in stools or vomit (bright red or black tarry stools).  Return also if you develop chest pain, difficulty breathing, dizziness or fainting, or become confused, poorly responsive, or inconsolable (young children). The constipation stays for more than 4 days.  There is belly (abdominal) or rectal pain.  You do not seem to be getting better.

## 2016-06-22 NOTE — Telephone Encounter (Signed)
Please advise      KP 

## 2016-06-22 NOTE — Telephone Encounter (Signed)
Pt called in. She says that she would like to be advised on if she could take 800 MG Motrin for her condition? She would like a call back at [phone]

## 2016-06-22 NOTE — Telephone Encounter (Signed)
Patient Name: Nancy [name]  DOB: [birth year]    Initial Comment Caller states she was seen yesterday. She's having gas, she may have gallbladder problems, having pain now. Can she take Ibuprofen '800mg'$  for the pain. Right under where her bra line is. Hurts to sit in her chair at work.   Nurse Assessment  Nurse: Leilani Merl, RN, Heather Date/Time (Eastern Time): 06/22/2016 8:56:29 AM  Confirm and document reason for call. If symptomatic, describe symptoms. You must click the next button to save text entered. ---Caller states she was seen yesterday. She's having gas, she may have gallbladder problems, having pain now. Can she take Ibuprofen '800mg'$  for the pain. Right under where her bra line is. Hurts to sit in her chair at work.  Has the patient traveled out of the country within the last 30 days? ---Not Applicable  Does the patient have any new or worsening symptoms? ---Yes  Will a triage be completed? ---Yes  Related visit to physician within the last 2 weeks? ---Yes  Does the PT have any chronic conditions? (i.e. diabetes, asthma, etc.) ---Yes  List chronic conditions. ---See MR  Is the patient pregnant or possibly pregnant? (Ask all females between the ages of 5-55) ---No  Is this a behavioral health or substance abuse call? 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

## 2016-06-22 NOTE — ED Triage Notes (Signed)
RUQ abd pain since last night. Denies N/V

## 2016-06-22 NOTE — Telephone Encounter (Signed)
Detailed message left on VM, however the patient is in the ED

## 2016-06-22 NOTE — ED Notes (Signed)
Provider at bedside

## 2016-06-23 ENCOUNTER — Ambulatory Visit (INDEPENDENT_AMBULATORY_CARE_PROVIDER_SITE_OTHER): Payer: Federal, State, Local not specified - PPO | Admitting: Internal Medicine

## 2016-06-23 ENCOUNTER — Encounter: Payer: Self-pay | Admitting: Internal Medicine

## 2016-06-23 VITALS — BP 108/68 | HR 78 | Ht 66.0 in | Wt 154.0 lb

## 2016-06-23 DIAGNOSIS — R1013 Epigastric pain: Secondary | ICD-10-CM | POA: Diagnosis not present

## 2016-06-23 DIAGNOSIS — R1011 Right upper quadrant pain: Secondary | ICD-10-CM | POA: Diagnosis not present

## 2016-06-23 MED ORDER — SUCRALFATE 1 GM/10ML PO SUSP: 1.0000 g | Freq: Three times a day (TID) | ORAL | 1 refills | Status: DC

## 2016-06-23 MED ORDER — ALPRAZOLAM 0.5 MG PO TABS: 0.5000 mg | ORAL_TABLET | ORAL | 0 refills | Status: DC | PRN

## 2016-06-23 MED ORDER — OMEPRAZOLE 40 MG PO CPDR: 40.0000 mg | DELAYED_RELEASE_CAPSULE | Freq: Two times a day (BID) | ORAL | 11 refills | Status: DC

## 2016-06-23 MED ORDER — DICYCLOMINE HCL 20 MG PO TABS: 20.0000 mg | ORAL_TABLET | Freq: Three times a day (TID) | ORAL | 11 refills | Status: DC | PRN

## 2016-06-23 NOTE — Progress Notes (Signed)
Subjective:    Patient ID: Nancy [name], female    DOB: Dec 19, 1965, 50 y.o.   MRN: [id number]  HPI Nancy [name] is a 50 year old female with a past medical history of small tubular adenoma of the colon, atypical chest pain, fibrocystic breast disease who is here in consultation at the request of Dr. Etter Sjogren to evaluate epigastric abdominal pain and recent right upper quadrant abdominal pain. She reports she's had 3 weeks of severe right upper quadrant abdominal pain. Initially this started in the right upper quadrant radiating around her back and then up into her right shoulder and under her right shoulder blade. This was not associated with nausea. It felt like a "gas pain". She took over-the-counter Pepcid which helped but not consistently. She also used Maalox which helped but not consistently and this caused loose stools. Gas-X helps slightly. She's been seen by primary care and also in the ER as recently as yesterday. GI cocktail has been given on 2 occasion and helped her gas pain and epigastric pain considerably. Currently she's not had any further right upper quadrant pain but she is having epigastric pain which seems worsen with eating. She's been trying to eat a very bland diet. Pain at times feels like spasm. She's avoided ibuprofen. Again no nausea or vomiting. She reports bowel movements a been regular except when they were loose when using Maalox. She denies melena or blood in her stool. She denies fever, chills, chest pain and dyspnea. Pain is not pleuritic.  She was started on omeprazole by primary care 48 hours ago at 40 mg once daily  She had an ultrasound performed of her abdomen which showed an unremarkable gallbladder. Lab work is been largely unrevealing   Review of Systems As per history of present illness, otherwise negative  Current Medications, Allergies, Past Medical History, Past Surgical History, Family History and Social History were reviewed in [employer] record.     Objective:   Physical Exam BP 108/68   Pulse 78   Ht '5\' 6"'$  (1.676 m)   Wt 154 lb (69.9 kg)   LMP 05/18/2016   BMI 24.86 kg/m  Constitutional: Well-developed and well-nourished. No distress. HEENT: Normocephalic and atraumatic. Oropharynx is clear and moist. No oropharyngeal exudate. Conjunctivae are normal.  No scleral icterus. Neck: Neck supple. Trachea midline. Cardiovascular: Normal rate, regular rhythm and intact distal pulses. No M/R/G Pulmonary/chest: Effort normal and breath sounds normal. No wheezing, rales or rhonchi. Abdominal: Soft, Epigastric tenderness subxiphoid without rebound or guarding, the right upper quadrant is nontender today, nondistended. Bowel sounds active throughout. There are no masses palpable. No hepatosplenomegaly. Extremities: no clubbing, cyanosis, or edema Lymphadenopathy: No cervical adenopathy noted. Neurological: Alert and oriented to person place and time. Skin: Skin is warm and dry. No rashes noted. Psychiatric: Normal mood and affect. Behavior is normal.  CLINICAL DATA:  Right upper quadrant and epigastric pain for 3 weeks.   EXAM: ABDOMEN ULTRASOUND COMPLETE   COMPARISON:  None.   FINDINGS: Gallbladder: No gallstones or wall thickening visualized. No sonographic Murphy sign noted by sonographer.   Common bile duct: Diameter: 2.0 mm   Liver: Normal echogenicity without focal lesion or biliary dilatation.   IVC: Normal caliber   Pancreas: Sonographically normal   Spleen: Normal size and echogenicity without focal lesions.   Right Kidney: Length: 11.0 cm. Normal renal cortical thickness and echogenicity without focal lesions or hydronephrosis.   Left Kidney: Length: 9.8 cm. Normal renal cortical thickness and echogenicity without  focal lesions or hydronephrosis.   Abdominal aorta: Normal caliber   Other findings: No ascites   IMPRESSION: Normal abdominal ultrasound examination.       Electronically Signed   By: Marijo Sanes M.D.   On: 05/27/2016 19:33   H. pylori breath test -- negative  CBC    Component Value Date/Time   WBC 6.1 06/22/2016 1200   RBC 5.00 06/22/2016 1200   HGB 14.2 06/22/2016 1200   HCT 42.0 06/22/2016 1200   PLT 245 06/22/2016 1200   MCV 84.0 06/22/2016 1200   MCH 28.4 06/22/2016 1200   MCHC 33.8 06/22/2016 1200   RDW 12.9 06/22/2016 1200   LYMPHSABS 3.1 06/22/2016 1200   MONOABS 0.3 06/22/2016 1200   EOSABS 0.1 06/22/2016 1200   BASOSABS 0.0 06/22/2016 1200   CMP     Component Value Date/Time   NA 139 06/22/2016 1200   K 3.6 06/22/2016 1200   CL 105 06/22/2016 1200   CO2 27 06/22/2016 1200   GLUCOSE 119 (H) 06/22/2016 1200   BUN 9 06/22/2016 1200   CREATININE 0.75 06/22/2016 1200   CREATININE 0.75 05/28/2016 1556   CALCIUM 9.2 06/22/2016 1200   PROT 7.5 06/22/2016 1200   ALBUMIN 4.4 06/22/2016 1200   AST 20 06/22/2016 1200   ALT 15 06/22/2016 1200   ALKPHOS 39 06/22/2016 1200   BILITOT 0.5 06/22/2016 1200   GFRNONAA >60 06/22/2016 1200   GFRAA >60 06/22/2016 1200   Lipase     Component Value Date/Time   LIPASE 24 06/22/2016 1200       Assessment & Plan:  50 year old female with a past medical history of small tubular adenoma of the colon, atypical chest pain, fibrocystic breast disease who is here in consultation at the request of Dr. Etter Sjogren to evaluate epigastric abdominal pain and recent right upper quadrant abdominal pain.  1. Epigastric pain/ulcer-like dyspepsia -- Differential includes severe GERD, gastroduodenitis, ulcer disease and less likely biliary dyskinesia. There is no evidence of acute cholecystitis by ultrasound and no gallstones were seen. She seems to have responded to antacid type therapy and also GI cocktail. For this reason I recommended increasing omeprazole to 40 mg twice a day before meals. Adding liquid Carafate 1 g 3 times a day before meals and at bedtime. Bentyl 20 mg 3 times a day when  necessary for upper crampy abdominal pain. I recommended upper endoscopy for further evaluation. We discussed the risks, benefits and alternatives and she wishes to proceed. If there is no response to this therapy and EGD is unrevealing I would recommend HIDA scan with CCK. She has a phobia for needles and gets very anxious when needing an IV for this reason I'm prescribing Xanax 0.5 mg to be used 30 minutes to one hour before coming in for her endoscopy. #10. No refills. I recommend she continue a bland diet, low in fat.

## 2016-06-23 NOTE — Patient Instructions (Signed)
We have sent the following medications to your pharmacy for you to pick up at your convenience:omeprazole increased to twice a day, carafate suspension, bentyl,xanax.  You have been scheduled for an endoscopy. Please follow written instructions given to you at your visit today. If you use inhalers (even only as needed), please bring them with you on the day of your procedure. Your physician has requested that you go to www.startemmi.com and enter the access code given to you at your visit today. This web site gives a general overview about your procedure. However, you should still follow specific instructions given to you by our office regarding your preparation for the procedure.

## 2016-06-25 ENCOUNTER — Ambulatory Visit: Payer: Federal, State, Local not specified - PPO | Admitting: Neurology

## 2016-06-28 ENCOUNTER — Other Ambulatory Visit: Payer: Self-pay

## 2016-06-28 ENCOUNTER — Other Ambulatory Visit: Payer: Self-pay | Admitting: Family Medicine

## 2016-06-28 ENCOUNTER — Telehealth: Payer: Self-pay | Admitting: Internal Medicine

## 2016-06-28 DIAGNOSIS — R1011 Right upper quadrant pain: Secondary | ICD-10-CM

## 2016-06-28 NOTE — Telephone Encounter (Signed)
Patient is calling back on refill request. Please advise.   Patient also stated that she has a CT Scan tomorrow and has the endoscopy on Wednesday. She would really like to get this medication so she can sleep because her pain level is still very high.

## 2016-06-28 NOTE — Telephone Encounter (Signed)
Relation to BT:YOMA Call back number:[phone] Pharmacy: [facility], [facility]   Reason for call:  Patient requesting a refill HYDROcodone-acetaminophen (NORCO) 5-325 MG tablet ([phone]) [phone]

## 2016-06-28 NOTE — Telephone Encounter (Signed)
Patient called back to follow up on request for refill. Will follow up again before 5.

## 2016-06-28 NOTE — Telephone Encounter (Signed)
Pt states that she wanted to update Dr. Hilarie Fredrickson on her symptoms. States that the meds Dr. Hilarie Fredrickson gave her for her stomach have helped but she is having more pain in the gallbladder area. Reports it hurts so bad and she was not able to sleep, her PCP gave her a Norco script. She has called for a refill on this from her PCP. States that she can only walk for 5 min and then she has to lay down due to the pain. States she could not go to work today due to the pain, is requesting a note for work that she could pick up when she comes for procedure on Wednesday. Pt wants to know if Dr. Hilarie Fredrickson has any other recommendations. Please advise.

## 2016-06-28 NOTE — Telephone Encounter (Signed)
I would recommend CT scan abd/pelvis with contrast, ASAP  If normal and EGD okay, will need HIDA with CCK Certainly okay for work note

## 2016-06-28 NOTE — Telephone Encounter (Signed)
Pt aware. Order placed for CT and message left for Kimberly CT to schedule scan.  Pt scheduled for ct of a/p at [place] ct 06/29/16'@10'$ :30am, pt to arrive there at 10:15am. Pt to be NPO after 6:30am, drink bottle 1 of contrast at 8:30am, bottle 2 at 9:30am. Pt aware of appt.

## 2016-06-29 ENCOUNTER — Ambulatory Visit (INDEPENDENT_AMBULATORY_CARE_PROVIDER_SITE_OTHER)
Admission: RE | Admit: 2016-06-29 | Discharge: 2016-06-29 | Disposition: A | Discharge status: Home / Self Care | Payer: Federal, State, Local not specified - PPO | Source: Ambulatory Visit | Attending: Internal Medicine | Admitting: Internal Medicine

## 2016-06-29 ENCOUNTER — Ambulatory Visit: Payer: Federal, State, Local not specified - PPO | Admitting: Neurology

## 2016-06-29 ENCOUNTER — Other Ambulatory Visit: Payer: Self-pay

## 2016-06-29 DIAGNOSIS — R1011 Right upper quadrant pain: Secondary | ICD-10-CM

## 2016-06-29 DIAGNOSIS — R160 Hepatomegaly, not elsewhere classified: Secondary | ICD-10-CM

## 2016-06-29 MED ORDER — IOPAMIDOL (ISOVUE-300) INJECTION 61%
100.0000 mL | Freq: Once | INTRAVENOUS | Status: AC | PRN
Start: 2016-06-29 — End: 2016-06-29
  Administered 2016-06-29: 100 mL via INTRAVENOUS

## 2016-06-29 MED ORDER — HYDROCODONE-ACETAMINOPHEN 5-325 MG PO TABS: 1.0000 | ORAL_TABLET | Freq: Four times a day (QID) | ORAL | 0 refills | Status: DC | PRN

## 2016-06-29 NOTE — Telephone Encounter (Signed)
Last filled: 06/29/16 Amt: 60, 0 Prescribed by Dr. Hilarie Fredrickson.

## 2016-06-30 ENCOUNTER — Encounter: Payer: Federal, State, Local not specified - PPO | Admitting: Internal Medicine

## 2016-07-02 ENCOUNTER — Other Ambulatory Visit (INDEPENDENT_AMBULATORY_CARE_PROVIDER_SITE_OTHER): Payer: Federal, State, Local not specified - PPO

## 2016-07-02 ENCOUNTER — Encounter: Payer: Federal, State, Local not specified - PPO | Admitting: Internal Medicine

## 2016-07-02 ENCOUNTER — Telehealth: Payer: Self-pay | Admitting: Internal Medicine

## 2016-07-02 ENCOUNTER — Ambulatory Visit (HOSPITAL_COMMUNITY)
Admission: RE | Admit: 2016-07-02 | Discharge: 2016-07-02 | Disposition: A | Discharge status: Home / Self Care | Payer: Federal, State, Local not specified - PPO | Source: Ambulatory Visit | Attending: Internal Medicine | Admitting: Internal Medicine

## 2016-07-02 ENCOUNTER — Other Ambulatory Visit: Payer: Self-pay

## 2016-07-02 DIAGNOSIS — R160 Hepatomegaly, not elsewhere classified: Secondary | ICD-10-CM | POA: Insufficient documentation

## 2016-07-02 DIAGNOSIS — R933 Abnormal findings on diagnostic imaging of other parts of digestive tract: Secondary | ICD-10-CM

## 2016-07-02 LAB — COMPREHENSIVE METABOLIC PANEL
ALT: 11 U/L (ref 0–35)
AST: 14 U/L (ref 0–37)
Albumin: 4.4 g/dL (ref 3.5–5.2)
Alkaline Phosphatase: 38 U/L — ABNORMAL LOW (ref 39–117)
BUN: 8 mg/dL (ref 6–23)
CO2: 28 mEq/L (ref 19–32)
Calcium: 9.7 mg/dL (ref 8.4–10.5)
Chloride: 103 mEq/L (ref 96–112)
Creatinine, Ser: 0.69 mg/dL (ref 0.40–1.20)
GFR: 116.16 mL/min (ref >60.00)
Glucose, Bld: 101 mg/dL — ABNORMAL HIGH (ref 70–99)
Potassium: 3.8 mEq/L (ref 3.5–5.1)
Sodium: 138 mEq/L (ref 135–145)
Total Bilirubin: 0.4 mg/dL (ref 0.2–1.2)
Total Protein: 7.3 g/dL (ref 6.0–8.3)

## 2016-07-02 LAB — CBC WITH DIFFERENTIAL/PLATELET
Basophils Absolute: 0 10*3/uL (ref 0.0–0.1)
Basophils Relative: 0.5 % (ref 0.0–3.0)
Eosinophils Absolute: 0.2 10*3/uL (ref 0.0–0.7)
Eosinophils Relative: 3 % (ref 0.0–5.0)
HCT: 43.1 % (ref 36.0–46.0)
Hemoglobin: 14.5 g/dL (ref 12.0–15.0)
Lymphocytes Relative: 42 % (ref 12.0–46.0)
Lymphs Abs: 2.5 10*3/uL (ref 0.7–4.0)
MCHC: 33.6 g/dL (ref 30.0–36.0)
MCV: 84.3 fl (ref 78.0–100.0)
Monocytes Absolute: 0.3 10*3/uL (ref 0.1–1.0)
Monocytes Relative: 5 % (ref 3.0–12.0)
Neutro Abs: 2.9 10*3/uL (ref 1.4–7.7)
Neutrophils Relative %: 49.5 % (ref 43.0–77.0)
Platelets: 253 10*3/uL (ref 150.0–400.0)
RBC: 5.11 Mil/uL (ref 3.87–5.11)
RDW: 13.1 % (ref 11.5–15.5)
WBC: 5.9 10*3/uL (ref 4.0–10.5)

## 2016-07-02 MED ORDER — GADOBENATE DIMEGLUMINE 529 MG/ML IV SOLN
14.0000 mL | Freq: Once | INTRAVENOUS | Status: AC | PRN
  Administered 2016-07-02: 14 mL via INTRAVENOUS

## 2016-07-02 NOTE — Telephone Encounter (Signed)
Patient advised that labs have been entered and she can come today for those.

## 2016-07-05 ENCOUNTER — Telehealth: Payer: Self-pay | Admitting: Internal Medicine

## 2016-07-05 LAB — QUANTIFERON TB GOLD ASSAY (BLOOD)
Interferon Gamma Release Assay: NEGATIVE
Mitogen-Nil: >10 IU/mL
Quantiferon Nil Value: 0.21 IU/mL
Quantiferon Tb Ag Minus Nil Value: <0 IU/mL

## 2016-07-05 NOTE — Telephone Encounter (Signed)
See lab results for additional details.  

## 2016-07-05 NOTE — Telephone Encounter (Signed)
Left message for patient to call back  

## 2016-07-05 NOTE — Patient Instructions (Signed)
EUS scheduled, pt instructed and medications reviewed.  Patient instructions mailed to home.  Patient to call with any questions or concerns.  

## 2016-07-06 ENCOUNTER — Telehealth: Payer: Self-pay | Admitting: Internal Medicine

## 2016-07-06 ENCOUNTER — Encounter (HOSPITAL_COMMUNITY): Payer: Self-pay | Admitting: *Deleted

## 2016-07-06 NOTE — Telephone Encounter (Signed)
I have left a voicemail for patient to call back. Refills x 1 year were sent on omeprazole to patient's pharmacy on 06/23/16. I will confirm that refills were sent to pharmacy she wants.  Patient also wants note to go back to work apparently. Dr Hilarie Fredrickson, did you take patient out of work? May she return to work or will she need to remain out?

## 2016-07-06 NOTE — Telephone Encounter (Signed)
She can return to work when she feels ready I took her out for as long as she felt needed as we work up her pain and abnormal CT/MRI She has EUS tomorrow

## 2016-07-07 ENCOUNTER — Encounter (HOSPITAL_COMMUNITY): Payer: Self-pay | Admitting: *Deleted

## 2016-07-07 ENCOUNTER — Encounter (HOSPITAL_COMMUNITY)
Admission: RE | Disposition: A | Discharge status: Home / Self Care | Payer: Self-pay | Source: Ambulatory Visit | Attending: Gastroenterology

## 2016-07-07 ENCOUNTER — Ambulatory Visit (HOSPITAL_COMMUNITY)
Admission: RE | Admit: 2016-07-07 | Discharge: 2016-07-07 | Disposition: A | Discharge status: Home / Self Care | Payer: Federal, State, Local not specified - PPO | Source: Ambulatory Visit | Attending: Gastroenterology | Admitting: Gastroenterology

## 2016-07-07 ENCOUNTER — Ambulatory Visit (HOSPITAL_COMMUNITY): Payer: Federal, State, Local not specified - PPO | Admitting: Anesthesiology

## 2016-07-07 DIAGNOSIS — R0789 Other chest pain: Secondary | ICD-10-CM | POA: Diagnosis not present

## 2016-07-07 DIAGNOSIS — Z7982 Long term (current) use of aspirin: Secondary | ICD-10-CM | POA: Insufficient documentation

## 2016-07-07 DIAGNOSIS — N6019 Diffuse cystic mastopathy of unspecified breast: Secondary | ICD-10-CM | POA: Diagnosis not present

## 2016-07-07 DIAGNOSIS — C221 Intrahepatic bile duct carcinoma: Secondary | ICD-10-CM

## 2016-07-07 DIAGNOSIS — C240 Malignant neoplasm of extrahepatic bile duct: Secondary | ICD-10-CM | POA: Diagnosis not present

## 2016-07-07 DIAGNOSIS — E0590 Thyrotoxicosis, unspecified without thyrotoxic crisis or storm: Secondary | ICD-10-CM | POA: Diagnosis not present

## 2016-07-07 DIAGNOSIS — C227 Other specified carcinomas of liver: Secondary | ICD-10-CM | POA: Insufficient documentation

## 2016-07-07 DIAGNOSIS — Z85038 Personal history of other malignant neoplasm of large intestine: Secondary | ICD-10-CM | POA: Diagnosis not present

## 2016-07-07 DIAGNOSIS — K219 Gastro-esophageal reflux disease without esophagitis: Secondary | ICD-10-CM | POA: Insufficient documentation

## 2016-07-07 DIAGNOSIS — C7800 Secondary malignant neoplasm of unspecified lung: Secondary | ICD-10-CM

## 2016-07-07 DIAGNOSIS — R51 Headache: Secondary | ICD-10-CM | POA: Diagnosis not present

## 2016-07-07 DIAGNOSIS — R933 Abnormal findings on diagnostic imaging of other parts of digestive tract: Secondary | ICD-10-CM | POA: Diagnosis not present

## 2016-07-07 DIAGNOSIS — Z79899 Other long term (current) drug therapy: Secondary | ICD-10-CM | POA: Diagnosis not present

## 2016-07-07 DIAGNOSIS — F419 Anxiety disorder, unspecified: Secondary | ICD-10-CM | POA: Diagnosis not present

## 2016-07-07 DIAGNOSIS — R1011 Right upper quadrant pain: Secondary | ICD-10-CM | POA: Diagnosis present

## 2016-07-07 HISTORY — DX: Personal history of other diseases of the nervous system and sense organs: Z86.69

## 2016-07-07 HISTORY — PX: EUS: SHX5427

## 2016-07-07 HISTORY — DX: Pneumonia, unspecified organism: J18.9

## 2016-07-07 SURGERY — UPPER ENDOSCOPIC ULTRASOUND (EUS) RADIAL
Anesthesia: Monitor Anesthesia Care

## 2016-07-07 MED ORDER — LIDOCAINE 2% (20 MG/ML) 5 ML SYRINGE
INTRAMUSCULAR | Status: AC
  Filled 2016-07-07: qty 5

## 2016-07-07 MED ORDER — LACTATED RINGERS IV SOLN
INTRAVENOUS | Status: DC
  Administered 2016-07-07: 1000 mL via INTRAVENOUS

## 2016-07-07 MED ORDER — SODIUM CHLORIDE 0.9 % IV SOLN
INTRAVENOUS | Status: DC
Start: 2016-07-07 — End: 2016-07-07

## 2016-07-07 MED ORDER — MIDAZOLAM HCL 2 MG/2ML IJ SOLN
INTRAMUSCULAR | Status: AC
  Filled 2016-07-07: qty 2

## 2016-07-07 MED ORDER — PROPOFOL 10 MG/ML IV BOLUS
INTRAVENOUS | Status: DC | PRN
  Administered 2016-07-07 (×5): 10 mg via INTRAVENOUS
  Administered 2016-07-07 (×2): 20 mg via INTRAVENOUS
  Administered 2016-07-07 (×2): 10 mg via INTRAVENOUS
  Administered 2016-07-07: 30 mg via INTRAVENOUS
  Administered 2016-07-07 (×6): 10 mg via INTRAVENOUS
  Administered 2016-07-07: 20 mg via INTRAVENOUS
  Administered 2016-07-07 (×11): 10 mg via INTRAVENOUS
  Administered 2016-07-07: 30 mg via INTRAVENOUS
  Administered 2016-07-07 (×5): 10 mg via INTRAVENOUS
  Administered 2016-07-07: 20 mg via INTRAVENOUS
  Administered 2016-07-07: 10 mg via INTRAVENOUS
  Administered 2016-07-07: 20 mg via INTRAVENOUS
  Administered 2016-07-07 (×11): 10 mg via INTRAVENOUS
  Administered 2016-07-07: 20 mg via INTRAVENOUS
  Administered 2016-07-07 (×2): 10 mg via INTRAVENOUS

## 2016-07-07 MED ORDER — MIDAZOLAM HCL 5 MG/5ML IJ SOLN
INTRAMUSCULAR | Status: DC | PRN
  Administered 2016-07-07: 2 mg via INTRAVENOUS

## 2016-07-07 MED ORDER — LIDOCAINE HCL (CARDIAC) 20 MG/ML IV SOLN
INTRAVENOUS | Status: DC | PRN
  Administered 2016-07-07: 100 mg via INTRATRACHEAL

## 2016-07-07 MED ORDER — PROPOFOL 10 MG/ML IV BOLUS
INTRAVENOUS | Status: AC
  Filled 2016-07-07: qty 40

## 2016-07-07 MED ORDER — PROPOFOL 10 MG/ML IV BOLUS
INTRAVENOUS | Status: AC
  Filled 2016-07-07: qty 20

## 2016-07-07 NOTE — Anesthesia Postprocedure Evaluation (Signed)
Anesthesia Post Note  Patient: Nancy [name]  Procedure(s) Performed: Procedure(s) (LRB): UPPER ENDOSCOPIC ULTRASOUND (EUS) RADIAL (N/A)  Patient location during evaluation: Endoscopy Anesthesia Type: MAC Level of consciousness: awake and alert, patient cooperative and oriented Pain management: pain level controlled Vital Signs Assessment: post-procedure vital signs reviewed and stable Respiratory status: spontaneous breathing, nonlabored ventilation and respiratory function stable Cardiovascular status: blood pressure returned to baseline and stable Postop Assessment: no signs of nausea or vomiting Anesthetic complications: no    Last Vitals:  Vitals:   07/07/16 0812  BP: (!) 164/94  Resp: 16  Temp: 36.8 C    Last Pain:  Vitals:   07/07/16 0812  TempSrc: Oral  PainSc: 3                  Carlie Corpus,E. Dealie Koelzer

## 2016-07-07 NOTE — Telephone Encounter (Signed)
Patient's husband calling back best contact number is [phone].

## 2016-07-07 NOTE — Op Note (Signed)
[facility] Patient Name: Nancy [name] Procedure Date: 07/07/2016 MRN: [id number] Attending MD: [name] , MD Date of Birth: [birth year] CSN: [id number] Age: 50 Admit Type: Outpatient Procedure:                Upper EUS Indications:              Suspected mass in liver on CT, MRI; abdominal pain                            to back; mass(es) in caudate lobe of liver and also                            gastrohepatic adenopathy. Providers:                [name], MD, Hilma Favors, RN, Corliss Parish, Technician Referring MD:             Zenovia Jarred, MD Medicines:                Monitored Anesthesia Care Complications:            No immediate complications. Estimated blood loss:                            None. Estimated Blood Loss:     Estimated blood loss: none. Procedure:                Pre-Anesthesia Assessment:                           - Prior to the procedure, a History and Physical                            was performed, and patient medications and                            allergies were reviewed. The patient's tolerance of                            previous anesthesia was also reviewed. The risks                            and benefits of the procedure and the sedation                            options and risks were discussed with the patient.                            All questions were answered, and informed consent                            was obtained. Prior Anticoagulants: The patient has  taken no previous anticoagulant or antiplatelet                            agents. ASA Grade Assessment: II - A patient with                            mild systemic disease. After reviewing the risks                            and benefits, the patient was deemed in                            satisfactory condition to undergo the procedure.                           After obtaining informed  consent, the endoscope was                            passed under direct vision. Throughout the                            procedure, the patient's blood pressure, pulse, and                            oxygen saturations were monitored continuously. The                            Endoscope was introduced through the mouth, and                            advanced to the second part of duodenum. The                            [id number] ([id number]) scope was introduced through                            the mouth, and advanced to the second part of                            duodenum. The [id number] ([id number]) scope was                            introduced through the mouth, and advanced to the                            antrum of the stomach. The upper EUS was                            accomplished without difficulty. The patient                            tolerated the procedure well. Scope In: Scope Out: Findings:      Endoscopic Finding :      1.  The UGI tract was normal (good viewswith standard adult gastroscope)      Endosonographic Finding :      1. There was a vaguely bordered, hypoechoic mass that involves the       caudate lobe of liver. This mass measures 4cm across and is confluent       with what appear to be two enlarged lymph nodes in the gastrohepatic       ligament. The largest of these presumed gastrohepatic lymphnodes       (perhaps they are actualy direct tumor extension?) measured 25 mm in       maximal cross-sectional diameter. The nodes were irregular and round,       hypoechoic and had poorly defined margins. Fine needle aspiration for       cytology was performed. Three passes were made with the 22 gauge needle       using a transgastric approach. A fourth pass was made of the lymphnode       with a 25 gauge FNA needle again using a transgastric approach. The       cytology was underwhelming on initial review with pathologist and so I       turned attention to the  vaguarly bordered mass in the liver and I       sampled this with two transgastric passes with a different 25 gauge FNA       needle.      2. Pancreatic parenchyma and main pancreatic duct were both normal.      3. CBD was normal; non-dilated Impression:               - Endoscopically normal UGI tract (good views with                            standard gastroscope)                           - Vaguely bordered 4cm mass in the caudate lobe of                            liver that is confluent with what appear to be                            enlarged gastrohepatic lymphnodes (these may                            represent direct tumor extension however).                            Preliminary cytology from the lymphnodes shows                            malignancy, glandular. Preliminary cytology from                            the liver mass shows the same. 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 Moderate Sedation:      N/A- Per Anesthesia Care Recommendation:           - Discharge patient to home (ambulatory).                           - Await cytology results.                           - Referral to surgical oncologist. Procedure Code(s):        --- Professional ---                           [phone], Esophagogastroduodenoscopy, flexible,                            transoral; with transendoscopic ultrasound-guided                            intramural or transmural fine needle                            aspiration/biopsy(s), (includes endoscopic                            ultrasound examination limited to the esophagus,                            stomach or duodenum, and adjacent structures) Diagnosis Code(s):        --- Professional ---                           R59.0, Localized enlarged lymph nodes                            R93.2, Abnormal findings on diagnostic imaging of                            liver and biliary tract CPT copyright 2016 [employer] Association. All rights reserved. The codes documented in this report are preliminary and upon coder review may  be revised to meet current compliance requirements. [name], MD 07/07/2016 10:30:12 AM This report has been signed electronically. Number of Addenda: 0

## 2016-07-07 NOTE — Telephone Encounter (Signed)
I have left a voicemail to call back.

## 2016-07-07 NOTE — H&P (View-Only) (Signed)
Subjective:    Patient ID: Nancy [name], female    DOB: 1966-08-26, 50 y.o.   MRN: [id number]  HPI Nancy [name] is a 50 year old female with a past medical history of small tubular adenoma of the colon, atypical chest pain, fibrocystic breast disease who is here in consultation at the request of Dr. Etter Sjogren to evaluate epigastric abdominal pain and recent right upper quadrant abdominal pain. She reports she's had 3 weeks of severe right upper quadrant abdominal pain. Initially this started in the right upper quadrant radiating around her back and then up into her right shoulder and under her right shoulder blade. This was not associated with nausea. It felt like a "gas pain". She took over-the-counter Pepcid which helped but not consistently. She also used Maalox which helped but not consistently and this caused loose stools. Gas-X helps slightly. She's been seen by primary care and also in the ER as recently as yesterday. GI cocktail has been given on 2 occasion and helped her gas pain and epigastric pain considerably. Currently she's not had any further right upper quadrant pain but she is having epigastric pain which seems worsen with eating. She's been trying to eat a very bland diet. Pain at times feels like spasm. She's avoided ibuprofen. Again no nausea or vomiting. She reports bowel movements a been regular except when they were loose when using Maalox. She denies melena or blood in her stool. She denies fever, chills, chest pain and dyspnea. Pain is not pleuritic.  She was started on omeprazole by primary care 48 hours ago at 40 mg once daily  She had an ultrasound performed of her abdomen which showed an unremarkable gallbladder. Lab work is been largely unrevealing   Review of Systems As per history of present illness, otherwise negative  Current Medications, Allergies, Past Medical History, Past Surgical History, Family History and Social History were reviewed in [facility] record.     Objective:   Physical Exam BP 108/68   Pulse 78   Ht '5\' 6"'$  (1.676 m)   Wt 154 lb (69.9 kg)   LMP 05/18/2016   BMI 24.86 kg/m  Constitutional: Well-developed and well-nourished. No distress. HEENT: Normocephalic and atraumatic. Oropharynx is clear and moist. No oropharyngeal exudate. Conjunctivae are normal.  No scleral icterus. Neck: Neck supple. Trachea midline. Cardiovascular: Normal rate, regular rhythm and intact distal pulses. No M/R/G Pulmonary/chest: Effort normal and breath sounds normal. No wheezing, rales or rhonchi. Abdominal: Soft, Epigastric tenderness subxiphoid without rebound or guarding, the right upper quadrant is nontender today, nondistended. Bowel sounds active throughout. There are no masses palpable. No hepatosplenomegaly. Extremities: no clubbing, cyanosis, or edema Lymphadenopathy: No cervical adenopathy noted. Neurological: Alert and oriented to person place and time. Skin: Skin is warm and dry. No rashes noted. Psychiatric: Normal mood and affect. Behavior is normal.  CLINICAL DATA:  Right upper quadrant and epigastric pain for 3 weeks.   EXAM: ABDOMEN ULTRASOUND COMPLETE   COMPARISON:  None.   FINDINGS: Gallbladder: No gallstones or wall thickening visualized. No sonographic Murphy sign noted by sonographer.   Common bile duct: Diameter: 2.0 mm   Liver: Normal echogenicity without focal lesion or biliary dilatation.   IVC: Normal caliber   Pancreas: Sonographically normal   Spleen: Normal size and echogenicity without focal lesions.   Right Kidney: Length: 11.0 cm. Normal renal cortical thickness and echogenicity without focal lesions or hydronephrosis.   Left Kidney: Length: 9.8 cm. Normal renal cortical thickness and echogenicity without  focal lesions or hydronephrosis.   Abdominal aorta: Normal caliber   Other findings: No ascites   IMPRESSION: Normal abdominal ultrasound examination.       Electronically Signed   By: Marijo Sanes M.D.   On: 05/27/2016 19:33   H. pylori breath test -- negative  CBC    Component Value Date/Time   WBC 6.1 06/22/2016 1200   RBC 5.00 06/22/2016 1200   HGB 14.2 06/22/2016 1200   HCT 42.0 06/22/2016 1200   PLT 245 06/22/2016 1200   MCV 84.0 06/22/2016 1200   MCH 28.4 06/22/2016 1200   MCHC 33.8 06/22/2016 1200   RDW 12.9 06/22/2016 1200   LYMPHSABS 3.1 06/22/2016 1200   MONOABS 0.3 06/22/2016 1200   EOSABS 0.1 06/22/2016 1200   BASOSABS 0.0 06/22/2016 1200   CMP     Component Value Date/Time   NA 139 06/22/2016 1200   K 3.6 06/22/2016 1200   CL 105 06/22/2016 1200   CO2 27 06/22/2016 1200   GLUCOSE 119 (H) 06/22/2016 1200   BUN 9 06/22/2016 1200   CREATININE 0.75 06/22/2016 1200   CREATININE 0.75 05/28/2016 1556   CALCIUM 9.2 06/22/2016 1200   PROT 7.5 06/22/2016 1200   ALBUMIN 4.4 06/22/2016 1200   AST 20 06/22/2016 1200   ALT 15 06/22/2016 1200   ALKPHOS 39 06/22/2016 1200   BILITOT 0.5 06/22/2016 1200   GFRNONAA >60 06/22/2016 1200   GFRAA >60 06/22/2016 1200   Lipase     Component Value Date/Time   LIPASE 24 06/22/2016 1200       Assessment & Plan:  50 year old female with a past medical history of small tubular adenoma of the colon, atypical chest pain, fibrocystic breast disease who is here in consultation at the request of Dr. Etter Sjogren to evaluate epigastric abdominal pain and recent right upper quadrant abdominal pain.  1. Epigastric pain/ulcer-like dyspepsia -- Differential includes severe GERD, gastroduodenitis, ulcer disease and less likely biliary dyskinesia. There is no evidence of acute cholecystitis by ultrasound and no gallstones were seen. She seems to have responded to antacid type therapy and also GI cocktail. For this reason I recommended increasing omeprazole to 40 mg twice a day before meals. Adding liquid Carafate 1 g 3 times a day before meals and at bedtime. Bentyl 20 mg 3 times a day when  necessary for upper crampy abdominal pain. I recommended upper endoscopy for further evaluation. We discussed the risks, benefits and alternatives and she wishes to proceed. If there is no response to this therapy and EGD is unrevealing I would recommend HIDA scan with CCK. She has a phobia for needles and gets very anxious when needing an IV for this reason I'm prescribing Xanax 0.5 mg to be used 30 minutes to one hour before coming in for her endoscopy. #10. No refills. I recommend she continue a bland diet, low in fat.

## 2016-07-07 NOTE — Interval H&P Note (Signed)
History and Physical Interval Note:  07/07/2016 8:16 AM  Nancy [name]  has presented today for surgery, with the diagnosis of abnormal liver   The various methods of treatment have been discussed with the patient and family. After consideration of risks, benefits and other options for treatment, the patient has consented to  Procedure(s): UPPER ENDOSCOPIC ULTRASOUND (EUS) RADIAL (N/A) as a surgical intervention .  The patient's history has been reviewed, patient examined, no change in status, stable for surgery.  I have reviewed the patient's chart and labs.  Questions were answered to the patient's satisfaction.     [name]

## 2016-07-07 NOTE — Transfer of Care (Signed)
Immediate Anesthesia Transfer of Care Note  Patient: Nancy [name]  Procedure(s) Performed: Procedure(s): UPPER ENDOSCOPIC ULTRASOUND (EUS) RADIAL (N/A)  Patient Location: PACU  Anesthesia Type:MAC  Level of Consciousness: sedated  Airway & Oxygen Therapy: Patient Spontanous Breathing and Patient connected to nasal cannula oxygen  Post-op Assessment: Report given to RN and Post -op Vital signs reviewed and stable  Post vital signs: Reviewed and stable  Last Vitals:  Vitals:   07/07/16 0812  BP: (!) 164/94  Resp: 16  Temp: 36.8 C    Last Pain:  Vitals:   07/07/16 0812  TempSrc: Oral  PainSc: 3       Patients Stated Pain Goal: 0 (79/48/01 6553)  Complications: No apparent anesthesia complications

## 2016-07-07 NOTE — Telephone Encounter (Signed)
I have spoken to patient. She has already gotten her omeprazole. I have advised her that a note has been created and placed at the front desk and she would like to pick this up tomorrow.

## 2016-07-07 NOTE — Anesthesia Preprocedure Evaluation (Addendum)
Anesthesia Evaluation  Patient identified by MRN, date of birth, ID band Patient awake    Reviewed: Allergy & Precautions, NPO status , Patient's Chart, lab work & pertinent test results  History of Anesthesia Complications Negative for: history of anesthetic complications  Airway Mallampati: I  TM Distance: >3 FB Neck ROM: Full    Dental  (+) Dental Advisory Given   Pulmonary neg pulmonary ROS,    breath sounds clear to auscultation       Cardiovascular negative cardio ROS   Rhythm:Regular Rate:Normal  '16 ECHO: EF 55-60%, valves OK   Neuro/Psych  Headaches, Anxiety    GI/Hepatic Neg liver ROS, GERD  Medicated and Controlled,  Endo/Other  Hyperthyroidism   Renal/GU negative Renal ROS     Musculoskeletal   Abdominal   Peds  Hematology negative hematology ROS (+)   Anesthesia Other Findings   Reproductive/Obstetrics                            Anesthesia Physical Anesthesia Plan  ASA: II  Anesthesia Plan: MAC   Post-op Pain Management:    Induction: Intravenous  Airway Management Planned: Natural Airway and Nasal Cannula  Additional Equipment:   Intra-op Plan:   Post-operative Plan:   Informed Consent: I have reviewed the patients History and Physical, chart, labs and discussed the procedure including the risks, benefits and alternatives for the proposed anesthesia with the patient or authorized representative who has indicated his/her understanding and acceptance.   Dental advisory given  Plan Discussed with: CRNA and Surgeon  Anesthesia Plan Comments: (Plan routine monitors, MAC)        Anesthesia Quick Evaluation

## 2016-07-07 NOTE — Discharge Instructions (Signed)
YOU HAD AN ENDOSCOPIC PROCEDURE TODAY: Refer to the procedure report and other information in the discharge instructions given to you for any specific questions about what was found during the examination. If this information does not answer your questions, please call [place] office at [phone] to clarify.  ° °YOU SHOULD EXPECT: Some feelings of bloating in the abdomen. Passage of more gas than usual. Walking can help get rid of the air that was put into your GI tract during the procedure and reduce the bloating. If you had a lower endoscopy (such as a colonoscopy or flexible sigmoidoscopy) you may notice spotting of blood in your stool or on the toilet paper. Some abdominal soreness may be present for a day or two, also. ° °DIET: Your first meal following the procedure should be a light meal and then it is ok to progress to your normal diet. A half-sandwich or bowl of soup is an example of a good first meal. Heavy or fried foods are harder to digest and may make you feel nauseous or bloated. Drink plenty of fluids but you should avoid alcoholic beverages for 24 hours. If you had a esophageal dilation, please see attached instructions for diet.   ° °ACTIVITY: Your care partner should take you home directly after the procedure. You should plan to take it easy, moving slowly for the rest of the day. You can resume normal activity the day after the procedure however YOU SHOULD NOT DRIVE, use power tools, machinery or perform tasks that involve climbing or major physical exertion for 24 hours (because of the sedation medicines used during the test).  ° °SYMPTOMS TO REPORT IMMEDIATELY: °A gastroenterologist can be reached at any hour. Please call [phone]  for any of the following symptoms:  °Following lower endoscopy (colonoscopy, flexible sigmoidoscopy) °Excessive amounts of blood in the stool  °Significant tenderness, worsening of abdominal pains  °Swelling of the abdomen that is new, acute  °Fever of 100° or  higher  °Following upper endoscopy (EGD, EUS, ERCP, esophageal dilation) °Vomiting of blood or coffee ground material  °New, significant abdominal pain  °New, significant chest pain or pain under the shoulder blades  °Painful or persistently difficult swallowing  °New shortness of breath  °Black, tarry-looking or red, bloody stools ° °FOLLOW UP:  °If any biopsies were taken you will be contacted by phone or by letter within the next 1-3 weeks. Call [phone]  if you have not heard about the biopsies in 3 weeks.  °Please also call with any specific questions about appointments or follow up tests. ° °

## 2016-07-08 ENCOUNTER — Encounter (HOSPITAL_COMMUNITY): Payer: Self-pay | Admitting: Gastroenterology

## 2016-07-08 ENCOUNTER — Telehealth: Payer: Self-pay | Admitting: Internal Medicine

## 2016-07-08 NOTE — Telephone Encounter (Signed)
Left message for pt and let her know it is usually 2-3 days.

## 2016-07-12 ENCOUNTER — Other Ambulatory Visit: Payer: Self-pay

## 2016-07-12 DIAGNOSIS — C801 Malignant (primary) neoplasm, unspecified: Secondary | ICD-10-CM

## 2016-07-13 ENCOUNTER — Telehealth: Payer: Self-pay | Admitting: Internal Medicine

## 2016-07-13 DIAGNOSIS — C7802 Secondary malignant neoplasm of left lung: Secondary | ICD-10-CM

## 2016-07-13 DIAGNOSIS — C7801 Secondary malignant neoplasm of right lung: Secondary | ICD-10-CM

## 2016-07-13 NOTE — Telephone Encounter (Signed)
Pt is calling back regarding this.

## 2016-07-13 NOTE — Telephone Encounter (Signed)
Pt states she started drinking beet juice, bone broth, and carrot juice on Sunday. States she has noticed that her stool is a very dark green and she wanted to make sure this is ok. Also reports she thinks the epigastric area between her breasts feels more firm than it did. Pt states she is very confused about the diagnosis and would like to speak to Dr. Hilarie Fredrickson about it to help her understand what is going on with her health. Pt is requesting Dr. Hilarie Fredrickson call her. Dr. Hilarie Fredrickson notified.

## 2016-07-14 NOTE — Telephone Encounter (Signed)
I called patient and got no answer. I left a message

## 2016-07-15 ENCOUNTER — Ambulatory Visit (INDEPENDENT_AMBULATORY_CARE_PROVIDER_SITE_OTHER)
Admission: RE | Admit: 2016-07-15 | Discharge: 2016-07-15 | Disposition: A | Discharge status: Home / Self Care | Payer: Federal, State, Local not specified - PPO | Source: Ambulatory Visit | Attending: Internal Medicine | Admitting: Internal Medicine

## 2016-07-15 DIAGNOSIS — C801 Malignant (primary) neoplasm, unspecified: Secondary | ICD-10-CM | POA: Diagnosis not present

## 2016-07-15 MED ORDER — IOPAMIDOL (ISOVUE-300) INJECTION 61%
80.0000 mL | Freq: Once | INTRAVENOUS | Status: AC | PRN
  Administered 2016-07-15: 80 mL via INTRAVENOUS

## 2016-07-15 NOTE — Telephone Encounter (Signed)
Patient returned phone call. Best # [phone]

## 2016-07-15 NOTE — Telephone Encounter (Signed)
No answer on home number either.

## 2016-07-15 NOTE — Telephone Encounter (Signed)
I called patient again today by phone.  No answer.  I left another message. I have been trying to return her call from earlier this week. I left message letting her know to call me at the office if she still would like to talk to you with questions, concerns, etc

## 2016-07-15 NOTE — Telephone Encounter (Signed)
Talk to patient and her husband Tim by phone at length tonight Questions answered regarding treatment plan which is for surgical consultation at [facility] on Monday with Dr.Knechtle. She needs oncology appointment as well and at least for now would like to see oncology here in [place] for convenience. She plans to ask Dr. Cain Saupe his opinion regarding oncology as well. Please arrange visit with Dr. Benay Spice or Dr. Burr Medico ASAP She is feeling overwhelmed and anxious about the unknown. She asks about support groups. Oncology should be able to help a lot with these issues We reviewed the CT chest results together which is concerning for pulmonary metastatic lesions in both lungs She will call back with any questions

## 2016-07-16 ENCOUNTER — Telehealth: Payer: Self-pay | Admitting: *Deleted

## 2016-07-16 NOTE — Telephone Encounter (Signed)
Patient has been scheduled to see Dr. Burr Medico for 07/28/16

## 2016-07-16 NOTE — Telephone Encounter (Signed)
Called and left VM that Dr. Benay Spice could see her on 11/24 at 0900 if she would like to be seen sooner than on 11/29. Provided direct # to reach navigator with her decision.

## 2016-07-16 NOTE — Addendum Note (Signed)
Addended by: Marlon Pel on: 07/16/2016 11:58 AM   Modules accepted: Orders

## 2016-07-16 NOTE — Telephone Encounter (Signed)
Referral placed.

## 2016-07-19 ENCOUNTER — Telehealth: Payer: Self-pay | Admitting: *Deleted

## 2016-07-19 NOTE — Telephone Encounter (Signed)
Oncology Nurse Navigator Documentation  Oncology Nurse Navigator Flowsheets 07/19/2016  Navigator Location [facility]  Referral date to RadOnc/MedOnc 07/16/2016  Navigator Encounter Type Introductory phone call  Abnormal Finding Date 06/29/2016  Called patient to follow up on navigator call from Friday. Offered her appointment for 11/24 at 0900, which is earlier than the current 07/28/16 with Dr. Burr Medico. She is seeing the surgeon at [facility] today and will call back later in afternoon with her decision.

## 2016-07-20 ENCOUNTER — Telehealth: Payer: Self-pay | Admitting: *Deleted

## 2016-07-20 NOTE — Telephone Encounter (Signed)
Left VM for patient that the 11/24 appointment with Dr. Benay Spice is no longer available. Confirmed she still has the 07/28/16 at 1:15 with Dr. Burr Medico on scheduled. Requested return call if she needs to change this or cancel. Left navigator's direct #.

## 2016-07-23 ENCOUNTER — Other Ambulatory Visit: Payer: Self-pay | Admitting: Internal Medicine

## 2016-07-26 ENCOUNTER — Telehealth: Payer: Self-pay | Admitting: *Deleted

## 2016-07-26 NOTE — Telephone Encounter (Signed)
Left VM with patient to please hold off on initiation of this complementary treatment until it is discussed with Dr. Burr Medico on 07/28/16.

## 2016-07-26 NOTE — Telephone Encounter (Signed)
11/27 @ 0730: VM left by patient to see Dr. Burr Medico as new patient on 07/28/16. Inquires if Dr. Burr Medico would be OK with her initiating B12 injections and "ozone therapy" prior to starting any chemotherapy? Her intergrative health specialist has recommended this.  Routed message to MD.

## 2016-07-26 NOTE — Telephone Encounter (Signed)
Please let her to hold it until her visit with me in a few days.  Thanks  Truitt Merle MD

## 2016-07-27 ENCOUNTER — Encounter: Payer: Self-pay | Admitting: Internal Medicine

## 2016-07-27 ENCOUNTER — Telehealth: Payer: Self-pay | Admitting: Internal Medicine

## 2016-07-27 NOTE — Telephone Encounter (Signed)
Letter mailed to the patient

## 2016-07-28 ENCOUNTER — Encounter: Payer: Self-pay | Admitting: Hematology

## 2016-07-28 ENCOUNTER — Encounter: Payer: Self-pay | Admitting: *Deleted

## 2016-07-28 ENCOUNTER — Telehealth: Payer: Self-pay | Admitting: Hematology

## 2016-07-28 ENCOUNTER — Ambulatory Visit (HOSPITAL_BASED_OUTPATIENT_CLINIC_OR_DEPARTMENT_OTHER): Payer: Federal, State, Local not specified - PPO | Admitting: Hematology

## 2016-07-28 ENCOUNTER — Ambulatory Visit (HOSPITAL_BASED_OUTPATIENT_CLINIC_OR_DEPARTMENT_OTHER): Payer: Federal, State, Local not specified - PPO

## 2016-07-28 VITALS — BP 124/68 | HR 85 | Temp 98.5°F | Resp 20 | Ht 66.0 in | Wt 146.6 lb

## 2016-07-28 DIAGNOSIS — R109 Unspecified abdominal pain: Secondary | ICD-10-CM | POA: Diagnosis not present

## 2016-07-28 DIAGNOSIS — C7801 Secondary malignant neoplasm of right lung: Principal | ICD-10-CM

## 2016-07-28 DIAGNOSIS — Z800 Family history of malignant neoplasm of digestive organs: Secondary | ICD-10-CM

## 2016-07-28 DIAGNOSIS — R918 Other nonspecific abnormal finding of lung field: Secondary | ICD-10-CM | POA: Diagnosis not present

## 2016-07-28 DIAGNOSIS — C221 Intrahepatic bile duct carcinoma: Secondary | ICD-10-CM | POA: Diagnosis not present

## 2016-07-28 DIAGNOSIS — Z23 Encounter for immunization: Secondary | ICD-10-CM

## 2016-07-28 DIAGNOSIS — Z803 Family history of malignant neoplasm of breast: Secondary | ICD-10-CM

## 2016-07-28 LAB — COMPREHENSIVE METABOLIC PANEL
ALT: 14 U/L (ref 0–55)
AST: 16 U/L (ref 5–34)
Albumin: 3.8 g/dL (ref 3.5–5.0)
Alkaline Phosphatase: 45 U/L (ref 40–150)
Anion Gap: 9 mEq/L (ref 3–11)
BUN: 9.4 mg/dL (ref 7.0–26.0)
CO2: 24 mEq/L (ref 22–29)
Calcium: 9.3 mg/dL (ref 8.4–10.4)
Chloride: 106 mEq/L (ref 98–109)
Creatinine: 0.7 mg/dL (ref 0.6–1.1)
EGFR: >90 mL/min/{1.73_m2} (ref >90)
Glucose: 92 mg/dl (ref 70–140)
Potassium: 3.7 mEq/L (ref 3.5–5.1)
Sodium: 140 mEq/L (ref 136–145)
Total Bilirubin: 0.25 mg/dL (ref 0.20–1.20)
Total Protein: 7.1 g/dL (ref 6.4–8.3)

## 2016-07-28 LAB — CBC WITH DIFFERENTIAL/PLATELET
BASO%: 0.7 % (ref 0.0–2.0)
Basophils Absolute: 0 10e3/uL (ref 0.0–0.1)
EOS%: 2.3 % (ref 0.0–7.0)
Eosinophils Absolute: 0.1 10e3/uL (ref 0.0–0.5)
HCT: 38.4 % (ref 34.8–46.6)
HGB: 12.5 g/dL (ref 11.6–15.9)
LYMPH%: 51.9 % — ABNORMAL HIGH (ref 14.0–49.7)
MCH: 27.6 pg (ref 25.1–34.0)
MCHC: 32.5 g/dL (ref 31.5–36.0)
MCV: 84.8 fL (ref 79.5–101.0)
MONO#: 0.2 10e3/uL (ref 0.1–0.9)
MONO%: 4.3 % (ref 0.0–14.0)
NEUT#: 2 10e3/uL (ref 1.5–6.5)
NEUT%: 40.8 % (ref 38.4–76.8)
Platelets: 255 10e3/uL (ref 145–400)
RBC: 4.53 10e6/uL (ref 3.70–5.45)
RDW: 13.6 % (ref 11.2–14.5)
WBC: 4.8 10e3/uL (ref 3.9–10.3)
lymph#: 2.5 10e3/uL (ref 0.9–3.3)

## 2016-07-28 MED ORDER — INFLUENZA VAC SPLIT QUAD 0.5 ML IM SUSY
0.5000 mL | PREFILLED_SYRINGE | Freq: Once | INTRAMUSCULAR | Status: DC
  Filled 2016-07-28: qty 0.5

## 2016-07-28 NOTE — Progress Notes (Signed)
[name]  [phone] Fax:([phone]) [place] Note   Patient Care Team: [name], DO as PCP - General 07/28/2016  CHIEF COMPLAINTS/PURPOSE OF CONSULTATION:  Newly diagnosed metastatic intrahepatic cholangiocarcinoma  Oncology History   Metastatic cholangiocarcinoma to lung [facility])   Staging form: Perihilar Bile Ducts, AJCC 7th Edition   - Clinical stage from 07/07/2016: Stage IVB (T2b, N1, M1) - Signed by Truitt Merle, MD on 07/28/2016      Metastatic cholangiocarcinoma to lung ([place])   05/22/2014 Procedure    Routine screening colonoscopy showed a sessile polyp measuring 4 mm in the sigmoid colon, removed, otherwise negative.      06/29/2016 Imaging    CT chest, abdomen and pelvis showed 2 indeterminant nodule in left and the right lung, 4-51m, indeterminate a hypovascular liver lesions largest in the caudate lobe measuring 2.6 cm, mild abdominal lymphadenopathy in the gastrohepatic ligament and portocaval space.      07/02/2016 Imaging    Abdominal MRI with and without contrast showed 2 lobular lesions in the caudate lobe, and is regular lymph nodes in the gastrohepatic ligament which is adjacent to the liver lesion, suspicious for malignancy. 2 small lesions in the right hepatic lobe are indeterminate, but concerning for metastasis.      07/07/2016 Procedure    EGD was negative, EUS biopsy of the liver and adjacent lymph node      07/07/2016 Initial Diagnosis    Metastatic cholangiocarcinoma to lung ([place]      07/07/2016 Initial Biopsy    Fine-needle aspiration of the liver lesion in caudate lobe and adjacent lymph nodes both showed metastatic adenocarcinoma.       HISTORY OF PRESENTING ILLNESS:  [name]89y.o. female is here because of Her recently diagnosed metastatic glandular carcinoma. She is accompanied by her husband and mother to my clinic today.  She has been having RUQ abdominal pain since mid  04/2016, she was seen by PCP and was treated for gas with GI cocktail and pepcide, which she did not help much. Her pain got worse, and radiates to right shoulder, she denies significant nausea, or bloating, BM normal, no fever, cough or dyspnea. She recently noticed mild chest discomfort in the low sternum area. She initially had the lab, ultrasound done by her primary care physician, which was unrevealing. She was referred to GI Dr. PHilarie Fredricksonand underwent EGD and CT scan, which showed a lobulated mass in the caudate lobe of liver, with at adjacent adenopathy. He underwent EUS and fine-needle biopsy of the liver mass and lymph nodes, all reviewed adenocarcinoma.  She has lost about 7lbs in the past 3 months. She has mild fatigue, but able to function at home. She has stopped working due to her abdominal pain and fatigue. She takes Norco every 1-3 times a day, and her pain seems not well controlled.  MEDICAL HISTORY:  Past Medical History:  Diagnosis Date  . Allergy    SEASONAL  . Anemia    HIGH SCHOOL  . Atopic eczema   . Fibrocystic breast disease   . GERD (gastroesophageal reflux disease)   . Hx of migraines    seasonal   . Pneumonia    6 years ago   . TIA (transient ischemic attack)     SURGICAL HISTORY: Past Surgical History:  Procedure Laterality Date  . EUS N/A 07/07/2016   Procedure: UPPER ENDOSCOPIC ULTRASOUND (EUS) RADIAL;  Surgeon: DMilus Banister MD;  [facility];  Service: Endoscopy;  Laterality: N/A;  . MYOMECTOMY  2002    SOCIAL HISTORY: Social History   Social History  . Marital status: Married    Spouse name: N/A  . Number of children: 0  . Years of education: 76   Occupational History  . [employer]  .  [employer]   Social History Main Topics  . Smoking status: Never Smoker  . Smokeless tobacco: Never Used  . Alcohol use Yes     Comment: maybe 3 times a year  . Drug use: No  . Sexual activity: Yes    Partners: Male   Other Topics  Concern  . Not on file   Social History Narrative  . No narrative on file    FAMILY HISTORY: Family History  Problem Relation Age of Onset  . Gout Mother   . Diabetes Mother   . Hypertension Father   . Diabetes Father   . Breast cancer Paternal Aunt   . Cancer Maternal Grandfather     throat cancer   . Cancer Cousin 61    GI cancer   . Thyroid disease Neg Hx     ALLERGIES:  is allergic to penicillins and lipitor [atorvastatin].  MEDICATIONS:  Current Outpatient Prescriptions  Medication Sig Dispense Refill  . ALPRAZolam (XANAX) 0.5 MG tablet Take 1 tablet (0.5 mg total) by mouth as needed for anxiety. 10 tablet 0  . aspirin 81 MG tablet Take 81 mg by mouth daily.    Marland Kitchen CARAFATE 1 GM/10ML suspension TAKE 10 MLS (1 G TOTAL) BY MOUTH 4 (FOUR) TIMES DAILY - WITH MEALS AND AT BEDTIME. 420 mL 1  . cetirizine (ZYRTEC) 10 MG tablet Take 10 mg by mouth daily as needed. For allergies     . Cholecalciferol (VITAMIN D3) 5000 UNITS CAPS Take 1 capsule by mouth daily.    . Ciclopirox 1 % shampoo Use as directed 2x a week for up to 4 weeks 120 mL 1  . COLOSTRUM PO Take 1.25 g by mouth 2 (two) times daily. 1.25 grams twice daily    . dicyclomine (BENTYL) 20 MG tablet Take 1 tablet (20 mg total) by mouth 3 (three) times daily as needed for spasms. 90 tablet 11  . HYDROcodone-acetaminophen (NORCO) 5-325 MG tablet Take 1-2 tablets by mouth every 6 (six) hours as needed for severe pain. 60 tablet 0  . meclizine (ANTIVERT) 50 MG tablet Take 1 tablet (50 mg total) by mouth 3 (three) times daily as needed. 30 tablet 0  . Menaquinone-7 (VITAMIN K2) 40 MCG TABS Take by mouth.    . mometasone (ELOCON) 0.1 % lotion Apply topically daily. 60 mL 0  . NON FORMULARY 1 capsule 1 day or 1 dose. Pregnelone, pt reports she takes daily.    . Nutritional Supplements (DHEA) 15-50 MG CAPS Take 1 capsule by mouth 3 (three) times a week.    Marland Kitchen omeprazole (PRILOSEC) 40 MG capsule Take 1 capsule (40 mg total) by mouth  2 (two) times daily before a meal. 60 capsule 11  . ondansetron (ZOFRAN) 4 MG tablet Take 1 tablet (4 mg total) by mouth every 6 (six) hours. 12 tablet 0  . Prenatal Vit-Fe Fumarate-FA (PRENATAL MULTIVITAMIN) TABS tablet Take 2 tablets by mouth daily at 12 noon.    . triamcinolone cream (KENALOG) 0.1 % Apply topically.    Marland Kitchen VITAMIN K PO Take 1 capsule by mouth daily.     No current facility-administered medications for  this visit.     REVIEW OF SYSTEMS:   Constitutional: Denies fevers, chills or abnormal night sweats Eyes: Denies blurriness of vision, double vision or watery eyes Ears, nose, mouth, throat, and face: Denies mucositis or sore throat Respiratory: Denies cough, dyspnea or wheezes Cardiovascular: Denies palpitation, chest discomfort or lower extremity swelling Gastrointestinal:  Denies nausea, heartburn or change in bowel habits Skin: Denies abnormal skin rashes Lymphatics: Denies new lymphadenopathy or easy bruising Neurological:Denies numbness, tingling or new weaknesses Behavioral/Psych: Mood is stable, no new changes  All other systems were reviewed with the patient and are negative.  PHYSICAL EXAMINATION: ECOG PERFORMANCE STATUS: 1 - Symptomatic but completely ambulatory  Vitals:   07/28/16 1318  BP: 124/68  Pulse: 85  Resp: 20  Temp: 98.5 F (36.9 C)   Filed Weights   07/28/16 1318  Weight: 146 lb 9.6 oz (66.5 kg)    GENERAL:alert, no distress and comfortable SKIN: skin color, texture, turgor are normal, no rashes or significant lesions EYES: normal, conjunctiva are pink and non-injected, sclera clear OROPHARYNX:no exudate, no erythema and lips, buccal mucosa, and tongue normal  NECK: supple, thyroid normal size, non-tender, without nodularity LYMPH:  no palpable lymphadenopathy in the cervical, axillary or inguinal LUNGS: clear to auscultation and percussion with normal breathing effort HEART: regular rate & rhythm and no murmurs and no lower extremity  edema ABDOMEN:abdomen soft, non-tender and normal bowel sounds Musculoskeletal:no cyanosis of digits and no clubbing  PSYCH: alert & oriented x 3 with fluent speech NEURO: no focal motor/sensory deficits  LABORATORY DATA:  I have reviewed the data as listed CBC Latest Ref Rng & Units 07/02/2016 06/22/2016 05/28/2016  WBC 4.0 - 10.5 K/uL 5.9 6.1 6.1  Hemoglobin 12.0 - 15.0 g/dL 14.5 14.2 13.1  Hematocrit 36.0 - 46.0 % 43.1 42.0 39.2  Platelets 150.0 - 400.0 K/uL 253.0 245 255   CMP Latest Ref Rng & Units 07/02/2016 06/22/2016 05/28/2016  Glucose 70 - 99 mg/dL 101(H) 119(H) 93  BUN 6 - 23 mg/dL '8 9 12  '$ Creatinine 0.40 - 1.20 mg/dL 0.69 0.75 0.75  Sodium 135 - 145 mEq/L 138 139 138  Potassium 3.5 - 5.1 mEq/L 3.8 3.6 4.0  Chloride 96 - 112 mEq/L 103 105 105  CO2 19 - 32 mEq/L '28 27 24  '$ Calcium 8.4 - 10.5 mg/dL 9.7 9.2 9.0  Total Protein 6.0 - 8.3 g/dL 7.3 7.5 7.2  Total Bilirubin 0.2 - 1.2 mg/dL 0.4 0.5 0.5  Alkaline Phos 39 - 117 U/L 38(L) 39 35  AST 0 - 37 U/L '14 20 18  '$ ALT 0 - 35 U/L '11 15 14   '$ PATHOLOGY REPORT  Diagnosis 07/07/2016 FINE NEEDLE ASPIRATION, ENDOSCOPIC, LIVER (SPECIMEN 2 OF 2 COLLECTED 07/07/16): MALIGNANT CELLS CONSISTENT WITH ADENOCARCINOMA.  Diagnosis 07/07/2016 FINE NEEDLE ASPIRATION, ENDOSCOPIC, GASTRO-HEPATIC LYMPH NODE (SPECIMEN 1 OF 2 COLLECTED 07/07/16): MALIGNANT CELLS CONSISTENT WITH ADENOCARCINOMA, SEE COMMENT. Preliminary Diagnosis Intraoperative Diagnosis: Few atypical glandular clusters - recommend additional material. 4) Adequate ([facility])  RADIOGRAPHIC STUDIES: I have personally reviewed the radiological images as listed and agreed with the findings in the report. Ct Chest W Contrast  Result Date: 07/15/2016 CLINICAL DATA:  New diagnosis cholangiocarcinoma. Evaluate for metastatic disease EXAM: CT CHEST WITH CONTRAST TECHNIQUE: Multidetector CT imaging of the chest was performed during intravenous contrast administration. CONTRAST:  74m ISOVUE-300  IOPAMIDOL (ISOVUE-300) INJECTION 61% COMPARISON:  MRI 07/02/2016 FINDINGS: Cardiovascular:  Unremarkable Mediastinum/Nodes: No axillary or supraclavicular adenopathy. No mediastinal hilar no esophagus Lungs/Pleura: Round 6 mm  nodule RIGHT middle lobe (image 60, series 3). Small peripheral nodule in the RIGHT middle lobe 4 mm (image 80, series). Rounded nodule in the LEFT upper lobe measures 5 mm (image 45, series 3) Upper Abdomen: Limited view of the liver, kidneys, pancreas are unremarkable. Low-density necrotic lesion in the caudate lobe. Periportal adenopathy again demonstrated. See MRI complete description Musculoskeletal: No aggressive osseous lesion IMPRESSION: 1. Single round pulmonary nodule within each lung are concerning for PULMONARY METASTASIS. 2. Necrotic metastasis and adenopathy in the porta hepatis as described on comparison MRI Electronically Signed   By: Suzy Bouchard M.D.   On: 07/15/2016 14:09   Mr Abdomen W Wo Contrast  Addendum Date: 07/02/2016   ADDENDUM REPORT: 07/02/2016 09:03 ADDENDUM: With necrotic lymph nodes, also consider atypical infection such as tuberculosis. Endoscopic ultrasound sampling may be safest route. Findings conveyed toJAY PYRTLE on 07/02/2016  at09:03. Electronically Signed   By: Suzy Bouchard M.D.   On: 07/02/2016 09:03   Result Date: 07/02/2016 CLINICAL DATA:  Abnormal CT.  Indeterminate hepatic lesion. EXAM: MRI ABDOMEN WITHOUT AND WITH CONTRAST TECHNIQUE: Multiplanar multisequence MR imaging of the abdomen was performed both before and after the administration of intravenous contrast. CONTRAST:  64m MULTIHANCE GADOBENATE DIMEGLUMINE 529 MG/ML IV SOLN COMPARISON:  CT 06/29/2016, ultrasound 05/27/2016 FINDINGS: Lower chest: No focal lesion at the lung bases. Hepatobiliary: Within the caudate lobe of the liver, rounded lesion measures 3.1 x 2.8 cm (image 45, series 901) lesion has no significant central enhancement but a thin rim of peripheral enhancement  which becomes more distinct and mildly thickened on the delayed imaging (image series 905). Immediately adjacent, there is a similar lesion measuring 2.3 by 1.8 cm (image 52, series 902). This lesion is dorsal to the caudate lobe and ventral to the IVC and appears to be external to the liver parenchyma although not completely determinant. This lesion has similar central nonenhancing tissue and peripheral delayed nodular enhancing tissue. Both these lesions are seen well on image 38, series 10 of the coronal post-contrast series. These lesions are intermediate hyperintense and T2 weighted imaging (image 14, series 7) There is adjacent smaller lymph nodes in the gastrohepatic ligament which are regular (image 45, series 1005) Within the RIGHT hepatic lobe there are 2 round lesions which are hyperintense on T2 weighted imaging measuring 7 mm and 5 mm on image 19, series 3. These correspond to subtle hypodense lesions on comparison CT. Pancreas:  No pancreatic lesion.  No duct dilatation. Spleen:  Normal spleen. Adrenals/Urinary Tract:  Adrenal glands and kidneys are normal. Stomach/Bowel: Stomach and limited view of the bowel unremarkable. Vascular/Lymphatic: Gastrohepatic adenopathy described in hepatic section. Aorta normal. Other:  No free-fluid. Musculoskeletal: No osseous abnormality IMPRESSION: 1. Two lobular lesions in the caudate lobe are concerning for centrally necrotic malignant lesions. Atypical hepatic infection or atypical benign hepatic lesions are much less favored. Consider primary hepatic neoplasms or metastatic lesions. Recommend tissue sampling. An FDG PET scan help guide biopsy site. 2. Irregular lymph nodes in the gastrohepatic ligament are favored metastatic. 3. Two small lesions in the RIGHT hepatic lobe are indeterminate but concerning given the findings in impression 1. These results will be called to the ordering clinician or representative by the Radiologist Assistant, and communication  documented in the PACS or zVision Dashboard. Electronically Signed: By: SSuzy BouchardM.D. On: 07/02/2016 08:47   Ct Abdomen Pelvis W Contrast  Result Date: 06/29/2016 CLINICAL DATA:  Worsening right upper quadrant pain for 1 month. EXAM: CT  ABDOMEN AND PELVIS WITH CONTRAST TECHNIQUE: Multidetector CT imaging of the abdomen and pelvis was performed using the standard protocol following bolus administration of intravenous contrast. CONTRAST:  116m ISOVUE-300 IOPAMIDOL (ISOVUE-300) INJECTION 61% COMPARISON:  None. FINDINGS: Lower Chest: No acute findings. Hepatobiliary: A hypovascular mass is seen in the caudate lobe of the liver which measures 2.6 x 2.1 cm on image 16/2. In addition, there are several small sub-cm low-attenuation lesions in the right and left hepatic lobes. These have nonspecific features. No radiographic signs of hepatic cirrhosis. Pancreas:  No mass or inflammatory changes. Spleen: Within normal limits in size and appearance. Adrenals/Urinary Tract: No masses identified. No evidence of hydronephrosis. Stomach/Bowel: No evidence of obstruction, inflammatory process or abnormal fluid collections. Vascular/Lymphatic: Soft tissue density suspicious for lymphadenopathy is seen in the gastrohepatic ligament, measuring up to 2.4 cm on image 17/2. In addition, there is abnormal soft tissue density with central fluid attenuation in the portacaval space measuring 2.9 x 2.4 cm on image 20/2. This is suspicious for necrotic lymphadenopathy. No other sites of lymphadenopathy identified within the abdomen or pelvis. No abdominal aortic aneurysm.  Aortic atherosclerosis. Reproductive: Multiple small uterine fibroids are seen, largest measuring 3.5 cm. No adnexal mass or free fluid identified. Other:  None. Musculoskeletal:  No suspicious bone lesions identified. IMPRESSION: Indeterminate hypovascular liver lesions, largest in the caudate lobe measuring 2.6 cm. Liver metastases cannot be excluded. Abdomen  MRI without and with contrast recommended for further characterization. Mild abdominal lymphadenopathy in gastrohepatic ligament and portacaval space. Metastatic lymphadenopathy cannot be excluded. Multiple small uterine fibroids. These results will be called to the ordering clinician or representative by the Radiologist Assistant, and communication documented in the PACS or zVision Dashboard. Electronically Signed   By: JEarle GellM.D.   On: 06/29/2016 13:18   Upper EUS 07/07/2016 Dr. JArdis Hughs - Endoscopically normal UGI tract (good views with standard gastroscope) - Vaguely bordered 4cm mass in the caudate lobe of liver that is confluent with what appear to be enlarged gastrohepatic lymphnodes (these may represent direct tumor extension however). Preliminary cytology from the lymphnodes shows malignancy, glandular. Preliminary cytology from the liver mass shows the same. Await final cytology results but this is most suspicious for peripheral intrahepatic cholangiocarcinoma with adjacent malignant adenopathy.   COLONOSCOPY 05/22/2014 ENDOSCOPIC IMPRESSION: 1. Sessile polyp measuring 4 mm in size was found in the sigmoid colon; polypectomy was performed with a cold snare 2. The colon mucosa was otherwise normal  ASSESSMENT & PLAN:  50year old African-American female, without significant past medical history, presented with abdominal pain  1. Metastatic intrahepatic clinical carcinoma, with probable lung metastasis, cT2bN1M1, stage IV -I reviewed her CT, MRI, endoscopy findings and her biopsy results with patient and her family members in details. -We have reviewed her case in our GI tumor board this morning. Based on the scans and endoscopy findings, this is most consistent with clinical carcinoma. Her liver and inguinal biopsy showed adenocarcinoma, I have requested immunostain CDX20 to ruled out a metastatic colon cancer. She did have a screening colonoscopy 2 years ago which was  negative. -She has 2 additional small liver lesions and 2 4-576mlung lesions, which are indeterminate but that is suspicious for metastatic disease. -She was seen by surgeon Dr. KnCain Saupeho thinks she is a not a candidate for surgical resection due to her metastatic disease. -We discussed her cancer is incurable at this stage, the goal of therapy is palliation, to prolong life and improve her quality life -I recommend  her to start systemic chemotherapy, with first-line cisplatin and gemcitabine, on day 1, 8 every 21 days. Other chemo options of FOLFOX, or single agent gemcitabine or capecitabine are also discussed. She is a young benefit, with good organ functions, would be at good candidate for first-line cis/gem  --Chemotherapy consent: Side effects including but does not not limited to, fatigue, nausea, vomiting, diarrhea, hair loss, neuropathy, fluid retention, renal and kidney dysfunction, neutropenic fever, needed for blood transfusion, bleeding, were discussed with patient in great detail. She agrees to proceed. -I will check tumor marker CEA and CA 19.9 -port placement by IR -chemo class  -pt is interested in herb supplement (liver) fresh,enxymix, and colostrum-LD, VitB infusion), was seen by [facility], I will check with our pharmacy regarding the introduction of the supplements with chemotherapy. I told her in general we do not recommend her to take a dose supplements during her chemotherapy.  2. Abdominal pain -I recommend her to consider hydrocodone/acetaminophen every 4-6 hours as needed for pain control  -Constipation and management reviewed with patient  Plan -Lab today -IR port placement -Chemotherapy class and a flu shot -I'll tentatively schedule her to start chemotherapy cisplatin and gemcitabine, in 1-2 weeks, I'll see her before her first dose chemotherapy   Orders Placed This Encounter  Procedures  . IR FLUORO GUIDE PORT INSERTION RIGHT    Standing  Status:   Future    Standing Expiration Date:   09/27/2017    Order Specific Question:   Reason for Exam (SYMPTOM  OR DIAGNOSIS REQUIRED)    Answer:   port for chemo    Order Specific Question:   Is the patient pregnant?    Answer:   No    Order Specific Question:   Preferred Imaging Location?    Answer:   [facility]  . CBC with Differential    Standing Status:   Standing    Number of Occurrences:   40    Standing Expiration Date:   07/28/2021  . Comprehensive metabolic panel    Standing Status:   Standing    Number of Occurrences:   40    Standing Expiration Date:   07/28/2021  . CA 19.9    Standing Status:   Standing    Number of Occurrences:   40    Standing Expiration Date:   07/28/2021  . CEA    Standing Status:   Future    Standing Expiration Date:   07/28/2017  . Pregnancy, urine    Standing Status:   Future    Standing Expiration Date:   07/28/2017    All questions were answered. The patient knows to call the clinic with any problems, questions or concerns. I spent 55 minutes counseling the patient face to face. The total time spent in the appointment was 60 minutes and more than 50% was on counseling.     Truitt Merle, MD 07/28/2016 3:37 PM

## 2016-07-28 NOTE — Patient Instructions (Signed)
Care Plan Summary- 07/28/2016 Name: Drucella Karbowski      DOB: [birth year] Your Medical Team:  Medical Oncologist:  Dr. 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   Recommendations: Based on information available as of today's consult. Recommendations may change depending on the results of further tests or exams. 1) Chemotherapy with Gemzar/Cisplatin-tentative start date 12/5 or 12/6       2)   Flu vaccine       3)    _______________________________________________________________________ Next Steps:  1) Chemo class this week with flu vaccine same day 2) Baseline labs today 3) Will make referral to dietician and clinical social worker 4) PAC placement as soon as possible per interventional radiology  Questions? Merceda Elks, RN, BSN at [phone]. Manuela Schwartz is your Oncology Nurse Navigator and is available to assist you while you're receiving your medical care at [facility].

## 2016-07-28 NOTE — Telephone Encounter (Signed)
Labs added for today, per 07/28/16 los. Port placement is scheduled for 08/03/16. Chemo Education class scheduled for 08/09/16. Appointments scheduled per 07/28/16 los. A copy of the AVS report and appointment schedule was given to the patient, per 07/28/16 los.

## 2016-07-28 NOTE — Progress Notes (Signed)
Oncology Nurse Navigator Documentation  Oncology Nurse Navigator Flowsheets 07/28/2016  Navigator Location [facility]  Referral date to RadOnc/MedOnc -  Navigator Encounter Type Initial MedOnc  Abnormal Finding Date -  Confirmed Diagnosis Date 07/07/2016  Patient Visit Type MedOnc;Initial  Treatment Phase Pre-Tx/Tx Discussion  Barriers/Navigation Needs Education;Coordination of Care;Family concerns  Education Newly Diagnosed Cancer Education;Preparing for Upcoming Treatment;Coping with Diagnosis/ Prognosis;Understanding Cancer/ Treatment Options  Interventions Referrals;Coordination of Care;Education  Referrals Social Work;Nutrition/dietician--scheduled appointment w/dietician for 1st tx date and flu vaccine on day of chemo class  Coordination of Care Radiology--PAC scheduled for 08/03/16 with IR--patient given information for prep and driver and time  Education Method Verbal;Written;Teach-back  Support Groups/Services GI Support Group;[place];Other--[facility] support Services  Acuity Level 2  Time Spent with Patient 60  Met with patient, husband and her mother(visiting from [place]) during new patient visit. Explained the role of the GI Nurse Navigator and provided New Patient Packet with information on: 1.  Cholangiocarcinoma --tumor marker tests, information on PAC 2. Support groups 3. Advanced Directives 4. Fall Safety Plan Answered questions, reviewed current treatment plan using TEACH back and provided emotional support. Provided copy of current treatment plan. Nancy [name] works for the [employer] in [employer] and does all [employer] for employees. She is very energetic and animated in her conversation. She expresses anxiety and is very interested in support groups and anything she can do to help her get through treatment. She has an Aeronautical engineer through a support group for cholangiocarcinoma. Will refer her to CSW to meet with her soon. She  has high anxiety when it comes to any type of venipuncture--helped her apply heat to her hands today and asked lab to stick her right hand if possible per her request and made them aware of her anxiety. Made patient aware of her flu vaccine on day of chemo class and dietician will see her in treatment area on 12/13.  Merceda Elks, RN, BSN GI Oncology [employer]

## 2016-07-29 LAB — CANCER ANTIGEN 19-9: CA 19-9: 36 U/mL — ABNORMAL HIGH (ref 0–35)

## 2016-07-29 LAB — CEA (IN HOUSE-CHCC): CEA (CHCC-In House): 24.67 ng/mL — ABNORMAL HIGH (ref 0.00–5.00)

## 2016-07-29 LAB — PREGNANCY, URINE: Pregnancy Test, Urine: NEGATIVE

## 2016-08-02 ENCOUNTER — Other Ambulatory Visit: Payer: Self-pay | Admitting: General Surgery

## 2016-08-02 ENCOUNTER — Other Ambulatory Visit: Payer: Self-pay | Admitting: *Deleted

## 2016-08-02 DIAGNOSIS — C221 Intrahepatic bile duct carcinoma: Secondary | ICD-10-CM

## 2016-08-02 DIAGNOSIS — C7800 Secondary malignant neoplasm of unspecified lung: Principal | ICD-10-CM

## 2016-08-02 MED ORDER — PROCHLORPERAZINE MALEATE 10 MG PO TABS: 10.0000 mg | ORAL_TABLET | Freq: Four times a day (QID) | ORAL | 0 refills | Status: DC | PRN

## 2016-08-03 ENCOUNTER — Telehealth: Payer: Self-pay | Admitting: Internal Medicine

## 2016-08-03 ENCOUNTER — Ambulatory Visit (HOSPITAL_COMMUNITY)
Admission: RE | Admit: 2016-08-03 | Discharge: 2016-08-03 | Disposition: A | Discharge status: Home / Self Care | Payer: Federal, State, Local not specified - PPO | Source: Ambulatory Visit | Attending: Hematology | Admitting: Hematology

## 2016-08-03 ENCOUNTER — Encounter (HOSPITAL_COMMUNITY): Payer: Self-pay

## 2016-08-03 ENCOUNTER — Other Ambulatory Visit: Payer: Self-pay | Admitting: Hematology

## 2016-08-03 DIAGNOSIS — C221 Intrahepatic bile duct carcinoma: Secondary | ICD-10-CM | POA: Insufficient documentation

## 2016-08-03 DIAGNOSIS — Z79899 Other long term (current) drug therapy: Secondary | ICD-10-CM | POA: Insufficient documentation

## 2016-08-03 DIAGNOSIS — C7800 Secondary malignant neoplasm of unspecified lung: Secondary | ICD-10-CM | POA: Insufficient documentation

## 2016-08-03 DIAGNOSIS — K219 Gastro-esophageal reflux disease without esophagitis: Secondary | ICD-10-CM | POA: Insufficient documentation

## 2016-08-03 DIAGNOSIS — Z7982 Long term (current) use of aspirin: Secondary | ICD-10-CM | POA: Insufficient documentation

## 2016-08-03 DIAGNOSIS — C7801 Secondary malignant neoplasm of right lung: Secondary | ICD-10-CM

## 2016-08-03 DIAGNOSIS — Z8673 Personal history of transient ischemic attack (TIA), and cerebral infarction without residual deficits: Secondary | ICD-10-CM | POA: Diagnosis not present

## 2016-08-03 DIAGNOSIS — J302 Other seasonal allergic rhinitis: Secondary | ICD-10-CM | POA: Insufficient documentation

## 2016-08-03 HISTORY — PX: IR GENERIC HISTORICAL: IMG1180011

## 2016-08-03 LAB — PROTIME-INR
INR: 0.98
Prothrombin Time: 12.9 seconds (ref 11.4–15.2)

## 2016-08-03 LAB — CBC WITH DIFFERENTIAL/PLATELET
Basophils Absolute: 0 10*3/uL (ref 0.0–0.1)
Basophils Relative: 1 %
Eosinophils Absolute: 0.1 10*3/uL (ref 0.0–0.7)
Eosinophils Relative: 1 %
HCT: 41.2 % (ref 36.0–46.0)
Hemoglobin: 14 g/dL (ref 12.0–15.0)
Lymphocytes Relative: 37 %
Lymphs Abs: 2.1 10*3/uL (ref 0.7–4.0)
MCH: 28.5 pg (ref 26.0–34.0)
MCHC: 34 g/dL (ref 30.0–36.0)
MCV: 83.7 fL (ref 78.0–100.0)
Monocytes Absolute: 0.3 10*3/uL (ref 0.1–1.0)
Monocytes Relative: 6 %
Neutro Abs: 3.2 10*3/uL (ref 1.7–7.7)
Neutrophils Relative %: 55 %
Platelets: 273 10*3/uL (ref 150–400)
RBC: 4.92 MIL/uL (ref 3.87–5.11)
RDW: 13.2 % (ref 11.5–15.5)
WBC: 5.7 10*3/uL (ref 4.0–10.5)

## 2016-08-03 LAB — BASIC METABOLIC PANEL
Anion gap: 11 (ref 5–15)
BUN: 9 mg/dL (ref 6–20)
CO2: 25 mmol/L (ref 22–32)
Calcium: 9.4 mg/dL (ref 8.9–10.3)
Chloride: 104 mmol/L (ref 101–111)
Creatinine, Ser: 0.67 mg/dL (ref 0.44–1.00)
GFR calc Af Amer: >60 mL/min (ref >60)
GFR calc non Af Amer: >60 mL/min (ref >60)
Glucose, Bld: 94 mg/dL (ref 65–99)
Potassium: 4.3 mmol/L (ref 3.5–5.1)
Sodium: 140 mmol/L (ref 135–145)

## 2016-08-03 LAB — APTT: aPTT: 28 seconds (ref 24–36)

## 2016-08-03 MED ORDER — FENTANYL CITRATE (PF) 100 MCG/2ML IJ SOLN
INTRAMUSCULAR | Status: AC
  Filled 2016-08-03: qty 4

## 2016-08-03 MED ORDER — LIDOCAINE-EPINEPHRINE (PF) 2 %-1:200000 IJ SOLN
INTRAMUSCULAR | Status: AC
  Filled 2016-08-03: qty 20

## 2016-08-03 MED ORDER — HEPARIN SOD (PORK) LOCK FLUSH 100 UNIT/ML IV SOLN
INTRAVENOUS | Status: AC
  Filled 2016-08-03: qty 5

## 2016-08-03 MED ORDER — MIDAZOLAM HCL 2 MG/2ML IJ SOLN
INTRAMUSCULAR | Status: AC | PRN
  Administered 2016-08-03 (×2): 1 mg via INTRAVENOUS
  Administered 2016-08-03: 2 mg via INTRAVENOUS
  Administered 2016-08-03 (×2): 1 mg via INTRAVENOUS

## 2016-08-03 MED ORDER — HYDROCODONE-ACETAMINOPHEN 5-325 MG PO TABS: 1.0000 | ORAL_TABLET | Freq: Four times a day (QID) | ORAL | 0 refills | Status: DC | PRN

## 2016-08-03 MED ORDER — MIDAZOLAM HCL 2 MG/2ML IJ SOLN
INTRAMUSCULAR | Status: AC
  Filled 2016-08-03: qty 6

## 2016-08-03 MED ORDER — FENTANYL CITRATE (PF) 100 MCG/2ML IJ SOLN
INTRAMUSCULAR | Status: AC | PRN
  Administered 2016-08-03 (×2): 25 ug via INTRAVENOUS
  Administered 2016-08-03: 50 ug via INTRAVENOUS

## 2016-08-03 MED ORDER — LIDOCAINE-EPINEPHRINE (PF) 2 %-1:200000 IJ SOLN
INTRAMUSCULAR | Status: AC | PRN
  Administered 2016-08-03: 20 mL

## 2016-08-03 MED ORDER — SODIUM CHLORIDE 0.9 % IV SOLN
INTRAVENOUS | Status: DC
  Administered 2016-08-03: 13:00:00 via INTRAVENOUS

## 2016-08-03 MED ORDER — VANCOMYCIN HCL IN DEXTROSE 1-5 GM/200ML-% IV SOLN
1000.0000 mg | INTRAVENOUS | Status: AC
  Administered 2016-08-03: 1000 mg via INTRAVENOUS
  Filled 2016-08-03: qty 200

## 2016-08-03 MED ORDER — HEPARIN SOD (PORK) LOCK FLUSH 100 UNIT/ML IV SOLN
INTRAVENOUS | Status: AC | PRN
  Administered 2016-08-03: 500 [IU]

## 2016-08-03 NOTE — H&P (Signed)
Chief Complaint: Patient was seen in consultation today for port placement at the request of Feng,Yan  Referring Physician(s): Feng,Yan  Supervising Physician: Corrie Mckusick  Patient Status: [facility] - Out-pt  History of Present Illness: Nancy [name] is a 50 y.o. female with newly diagnosed metastatic intrahepatic adenocarcinoma. She has seen Oncology and has been referred for port placement. Chart, PMHx, meds, imaging, allergies reviewed. Has been NPO today except for a little bit of water. No recent fevers, chills, SOB, cough, abd pain, diarrhea, dysuria.  Past Medical History:  Diagnosis Date  . Allergy    SEASONAL  . Anemia    [employer]OL  . Atopic eczema   . Fibrocystic breast disease   . GERD (gastroesophageal reflux disease)   . Hx of migraines    seasonal   . Pneumonia    6 years ago   . TIA (transient ischemic attack)     Past Surgical History:  Procedure Laterality Date  . EUS N/A 07/07/2016   Procedure: UPPER ENDOSCOPIC ULTRASOUND (EUS) RADIAL;  Surgeon: [name], MD;  Location: [facility] ENDOS[facility];  Service: Endoscopy;  Laterality: N/A;  . MYOMECTOMY  2002    Allergies: Penicillins and Lipitor [atorvastatin]  Medications: Prior to Admission medications   Medication Sig Start Date End Date Taking? Authorizing Provider  ALPRAZolam Duanne Moron) 0.5 MG tablet Take 1 tablet (0.5 mg total) by mouth as needed for anxiety. 06/23/16  Yes Jerene Bears, MD  aspirin 81 MG tablet Take 81 mg by mouth daily.   Yes Historical Provider, MD  Cholecalciferol (VITAMIN D3) 5000 UNITS CAPS Take 1 capsule by mouth daily.   Yes Historical Provider, MD  COLOSTRUM PO Take 1.25 g by mouth 2 (two) times daily. 1.25 grams twice daily   Yes Historical Provider, MD  dicyclomine (BENTYL) 20 MG tablet Take 1 tablet (20 mg total) by mouth 3 (three) times daily as needed for spasms. 06/23/16  Yes Jerene Bears, MD  HYDROcodone-acetaminophen (NORCO) 5-325 MG tablet Take 1-2 tablets by mouth  every 6 (six) hours as needed for severe pain. 08/03/16  Yes Jerene Bears, MD  ondansetron (ZOFRAN) 4 MG tablet Take 1 tablet (4 mg total) by mouth every 6 (six) hours. 09/28/15  Yes Benjamin Cartner, PA-C  CARAFATE 1 GM/10ML suspension TAKE 10 MLS (1 G TOTAL) BY MOUTH 4 (FOUR) TIMES DAILY - WITH MEALS AND AT BEDTIME. 07/26/16   Jerene Bears, MD  cetirizine (ZYRTEC) 10 MG tablet Take 10 mg by mouth daily as needed. For allergies     Historical Provider, MD  Ciclopirox 1 % shampoo Use as directed 2x a week for up to 4 weeks 08/21/15   Rosalita Chessman Chase, DO  meclizine (ANTIVERT) 50 MG tablet Take 1 tablet (50 mg total) by mouth 3 (three) times daily as needed. 08/29/15   Yvonne R Lowne Chase, DO  Menaquinone-7 (VITAMIN K2) 40 MCG TABS Take by mouth.    Historical Provider, MD  mometasone (ELOCON) 0.1 % lotion Apply topically daily. 08/21/15   Alferd Apa Lowne Chase, DO  NON FORMULARY 1 capsule 1 day or 1 dose. Pregnelone, pt reports she takes daily.    Historical Provider, MD  Nutritional Supplements (DHEA) 15-50 MG CAPS Take 1 capsule by mouth 3 (three) times a week.    Historical Provider, MD  omeprazole (PRILOSEC) 40 MG capsule Take 1 capsule (40 mg total) by mouth 2 (two) times daily before a meal. 06/23/16   Jerene Bears, MD  Prenatal Vit-Fe Fumarate-FA (PRENATAL MULTIVITAMIN) TABS tablet Take 2 tablets by mouth daily at 12 noon.    Historical Provider, MD  prochlorperazine (COMPAZINE) 10 MG tablet Take 1 tablet (10 mg total) by mouth every 6 (six) hours as needed for nausea or vomiting. Cautions :  Can cause drowsiness. 08/02/16   Truitt Merle, MD  triamcinolone cream (KENALOG) 0.1 % Apply topically.    Historical Provider, MD  VITAMIN K PO Take 1 capsule by mouth daily.    Historical Provider, MD     Family History  Problem Relation Age of Onset  . Gout Mother   . Diabetes Mother   . Hypertension Father   . Diabetes Father   . Breast cancer Paternal Aunt   . Cancer Maternal Grandfather      throat cancer   . Cancer Cousin 71    GI cancer   . Thyroid disease Neg Hx     Social History   Social History  . Marital status: Married    Spouse name: N/A  . Number of children: 0  . Years of education: 45   Occupational History  . [employer]  .  [employer]   Social History Main Topics  . Smoking status: Never Smoker  . Smokeless tobacco: Never Used  . Alcohol use Yes     Comment: maybe 3 times a year  . Drug use: No  . Sexual activity: Yes    Partners: Male   Other Topics Concern  . None   Social History Narrative  . None     Review of Systems: A 12 point ROS discussed and pertinent positives are indicated in the HPI above.  All other systems are negative.  Review of Systems  Vital Signs: LMP 05/18/2016 (Exact Date)   Physical Exam  Constitutional: She is oriented to person, place, and time. She appears well-developed and well-nourished. No distress.  HENT:  Head: Normocephalic.  Mouth/Throat: Oropharynx is clear and moist.  Neck: Normal range of motion. No tracheal deviation present.  Cardiovascular: Normal rate, regular rhythm and normal heart sounds.   Pulmonary/Chest: Effort normal and breath sounds normal. No respiratory distress. She has no wheezes.  Neurological: She is alert and oriented to person, place, and time.  Skin: Skin is warm and dry.  Psychiatric: She has a normal mood and affect. Judgment normal.      Mallampati Score:  MD Evaluation Airway: WNL Heart: WNL Abdomen: WNL Chest/ Lungs: WNL ASA  Classification: 2 Mallampati/Airway Score: One  Imaging: Ct Chest W Contrast  Result Date: 07/15/2016 CLINICAL DATA:  New diagnosis cholangiocarcinoma. Evaluate for metastatic disease EXAM: CT CHEST WITH CONTRAST TECHNIQUE: Multidetector CT imaging of the chest was performed during intravenous contrast administration. CONTRAST:  41m ISOVUE-300 IOPAMIDOL (ISOVUE-300) INJECTION 61% COMPARISON:  MRI 07/02/2016 FINDINGS:  Cardiovascular:  Unremarkable Mediastinum/Nodes: No axillary or supraclavicular adenopathy. No mediastinal hilar no esophagus Lungs/Pleura: Round 6 mm nodule RIGHT middle lobe (image 60, series 3). Small peripheral nodule in the RIGHT middle lobe 4 mm (image 80, series). Rounded nodule in the LEFT upper lobe measures 5 mm (image 45, series 3) Upper Abdomen: Limited view of the liver, kidneys, pancreas are unremarkable. Low-density necrotic lesion in the caudate lobe. Periportal adenopathy again demonstrated. See MRI complete description Musculoskeletal: No aggressive osseous lesion IMPRESSION: 1. Single round pulmonary nodule within each lung are concerning for PULMONARY METASTASIS. 2. Necrotic metastasis and adenopathy in the porta hepatis as described on comparison MRI Electronically Signed  By: Suzy Bouchard M.D.   On: 07/15/2016 14:09    Labs:  CBC:  Recent Labs  05/28/16 1556 06/22/16 1200 07/02/16 1711 07/28/16 1544  WBC 6.1 6.1 5.9 4.8  HGB 13.1 14.2 14.5 12.5  HCT 39.2 42.0 43.1 38.4  PLT 255 245 253.0 255    COAGS: No results for input(s): INR, APTT in the last 8760 hours.  BMP:  Recent Labs  03/16/16 0730 05/28/16 1556 06/22/16 1200 07/02/16 1711 07/28/16 1544  NA 137 138 139 138 140  K 4.2 4.0 3.6 3.8 3.7  CL 104 105 105 103  --   CO2 '26 24 27 28 24  '$ GLUCOSE 79 93 119* 101* 92  BUN '13 12 9 8 '$ 9.4  CALCIUM 8.8 9.0 9.2 9.7 9.3  CREATININE 0.73 0.75 0.75 0.69 0.7  GFRNONAA  --   --  >60  --   --   GFRAA  --   --  >60  --   --     LIVER FUNCTION TESTS:  Recent Labs  05/28/16 1556 06/22/16 1200 07/02/16 1711 07/28/16 1544  BILITOT 0.5 0.5 0.4 0.25  AST '18 20 14 16  '$ ALT '14 15 11 14  '$ ALKPHOS 35 39 38* 45  PROT 7.2 7.5 7.3 7.1  ALBUMIN 4.7 4.4 4.4 3.8    TUMOR MARKERS: No results for input(s): AFPTM, CEA, CA199, CHROMGRNA in the last 8760 hours.  Assessment and Plan: Metastatic intrahepatic adenocarcinoma For port placement Labs pending Risks  and Benefits discussed with the patient including, but not limited to bleeding, infection, pneumothorax, or fibrin sheath development and need for additional procedures. All of the patient's questions were answered, patient is agreeable to proceed. Consent signed and in chart.    Thank you for this interesting consult.  I greatly enjoyed meeting Nancy [name] and look forward to participating in their care.  A copy of this report was sent to the requesting provider on this date.  Electronically Signed: Ascencion Dike 08/03/2016, 1:18 PM   I spent a total of 20 minutes in face to face in clinical consultation, greater than 50% of which was counseling/coordinating care for port placement

## 2016-08-03 NOTE — Telephone Encounter (Signed)
Rx created and given to Dr Hilarie Fredrickson for signature.

## 2016-08-03 NOTE — Discharge Instructions (Signed)
Implanted Port Home Guide °An implanted port is a type of central line that is placed under the skin. Central lines are used to provide IV access when treatment or nutrition needs to be given through a person's veins. Implanted ports are used for long-term IV access. An implanted port may be placed because:  °· You need IV medicine that would be irritating to the small veins in your hands or arms.   °· You need long-term IV medicines, such as antibiotics.   °· You need IV nutrition for a long period.   °· You need frequent blood draws for lab tests.   °· You need dialysis.   °Implanted ports are usually placed in the chest area, but they can also be placed in the upper arm, the abdomen, or the leg. An implanted port has two main parts:  °· Reservoir. The reservoir is round and will appear as a small, raised area under your skin. The reservoir is the part where a needle is inserted to give medicines or draw blood.   °· Catheter. The catheter is a thin, flexible tube that extends from the reservoir. The catheter is placed into a large vein. Medicine that is inserted into the reservoir goes into the catheter and then into the vein.   °HOW WILL I CARE FOR MY INCISION SITE? °Do not get the incision site wet. Bathe or shower as directed by your health care provider.  °HOW IS MY PORT ACCESSED? °Special steps must be taken to access the port:  °· Before the port is accessed, a numbing cream can be placed on the skin. This helps numb the skin over the port site.   °· Your health care provider uses a sterile technique to access the port. °¨ Your health care provider must put on a mask and sterile gloves. °¨ The skin over your port is cleaned carefully with an antiseptic and allowed to dry. °¨ The port is gently pinched between sterile gloves, and a needle is inserted into the port. °· Only "non-coring" port needles should be used to access the port. Once the port is accessed, a blood return should be checked. This helps  ensure that the port is in the vein and is not clogged.   °· If your port needs to remain accessed for a constant infusion, a clear (transparent) bandage will be placed over the needle site. The bandage and needle will need to be changed every week, or as directed by your health care provider.   °· Keep the bandage covering the needle clean and dry. Do not get it wet. Follow your health care provider's instructions on how to take a shower or bath while the port is accessed.   °· If your port does not need to stay accessed, no bandage is needed over the port.   °WHAT IS FLUSHING? °Flushing helps keep the port from getting clogged. Follow your health care provider's instructions on how and when to flush the port. Ports are usually flushed with saline solution or a medicine called heparin. The need for flushing will depend on how the port is used.  °· If the port is used for intermittent medicines or blood draws, the port will need to be flushed:   °¨ After medicines have been given.   °¨ After blood has been drawn.   °¨ As part of routine maintenance.   °· If a constant infusion is running, the port may not need to be flushed.   °HOW LONG WILL MY PORT STAY IMPLANTED? °The port can stay in for as long as your health care   provider thinks it is needed. When it is time for the port to come out, surgery will be done to remove it. The procedure is similar to the one performed when the port was put in.  WHEN SHOULD I SEEK IMMEDIATE MEDICAL CARE? When you have an implanted port, you should seek immediate medical care if:   You notice a bad smell coming from the incision site.   You have swelling, redness, or drainage at the incision site.   You have more swelling or pain at the port site or the surrounding area.   You have a fever that is not controlled with medicine. This information is not intended to replace advice given to you by your health care provider. Make sure you discuss any questions you have with  your health care provider. Document Released: 08/16/2005 Document Revised: 06/06/2013 Document Reviewed: 04/23/2013 Elsevier Interactive Patient Education  2017 [place] Insertion, Care After Refer to this sheet in the next few weeks. These instructions provide you with information on caring for yourself after your procedure. Your health care provider may also give you more specific instructions. Your treatment has been planned according to current medical practices, but problems sometimes occur. Call your health care provider if you have any problems or questions after your procedure. WHAT TO EXPECT AFTER THE PROCEDURE After your procedure, it is typical to have the following:   Discomfort at the port insertion site. Ice packs to the area will help.  Bruising on the skin over the port. This will subside in 3-4 days. HOME CARE INSTRUCTIONS  After your port is placed, you will get a manufacturer's information card. The card has information about your port. Keep this card with you at all times.   Know what kind of port you have. There are many types of ports available.   Wear a medical alert bracelet in case of an emergency. This can help alert health care workers that you have a port.   The port can stay in for as long as your health care provider believes it is necessary.   A home health care nurse may give medicines and take care of the port.   You or a family member can get special training and directions for giving medicine and taking care of the port at home.  SEEK MEDICAL CARE IF:   Your port does not flush or you are unable to get a blood return.   You have a fever or chills. SEEK IMMEDIATE MEDICAL CARE IF:  You have new fluid or pus coming from your incision.   You notice a bad smell coming from your incision site.   You have swelling, pain, or more redness at the incision or port site.   You have chest pain or shortness of breath. This  information is not intended to replace advice given to you by your health care provider. Make sure you discuss any questions you have with your health care provider. Document Released: 06/06/2013 Document Revised: 08/21/2013 Document Reviewed: 06/06/2013 Elsevier Interactive Patient Education  2017 [place].  Moderate Conscious Sedation, Adult, Care After These instructions provide you with information about caring for yourself after your procedure. Your health care provider may also give you more specific instructions. Your treatment has been planned according to current medical practices, but problems sometimes occur. Call your health care provider if you have any problems or questions after your procedure. What can I expect after the procedure? After your procedure, it is common:  To feel sleepy for several hours.  To feel clumsy and have poor balance for several hours.  To have poor judgment for several hours.  To vomit if you eat too soon. Follow these instructions at home: For at least 24 hours after the procedure:   Do not:  Participate in activities where you could fall or become injured.  Drive.  Use heavy machinery.  Drink alcohol.  Take sleeping pills or medicines that cause drowsiness.  Make important decisions or sign legal documents.  Take care of children on your own.  Rest. Eating and drinking  Follow the diet recommended by your health care provider.  If you vomit:  Drink water, juice, or soup when you can drink without vomiting.  Make sure you have little or no nausea before eating solid foods. General instructions  Have a responsible adult stay with you until you are awake and alert.  Take over-the-counter and prescription medicines only as told by your health care provider.  If you smoke, do not smoke without supervision.  Keep all follow-up visits as told by your health care provider. This is important. Contact a health care provider  if:  You keep feeling nauseous or you keep vomiting.  You feel light-headed.  You develop a rash.  You have a fever. Get help right away if:  You have trouble breathing. This information is not intended to replace advice given to you by your health care provider. Make sure you discuss any questions you have with your health care provider. Document Released: 06/06/2013 Document Revised: 01/19/2016 Document Reviewed: 12/06/2015 Elsevier Interactive Patient Education  2017 [employer].

## 2016-08-03 NOTE — Procedures (Signed)
Interventional Radiology Procedure Note  Procedure: Placement of a right IJ approach single lumen PowerPort.  Tip is positioned at the superior cavoatrial junction and catheter is ready for immediate use.  Complications: No immediate Recommendations:  - Ok to shower tomorrow - Do not submerge for 7 days - Routine line care   Signed,  Taran Hable S. Cloe Sockwell, DO    

## 2016-08-06 ENCOUNTER — Other Ambulatory Visit: Payer: Self-pay | Admitting: *Deleted

## 2016-08-06 DIAGNOSIS — C221 Intrahepatic bile duct carcinoma: Secondary | ICD-10-CM

## 2016-08-06 DIAGNOSIS — C7800 Secondary malignant neoplasm of unspecified lung: Principal | ICD-10-CM

## 2016-08-06 MED ORDER — LIDOCAINE-PRILOCAINE 2.5-2.5 % EX CREA: 1.0000 "application " | TOPICAL_CREAM | CUTANEOUS | 1 refills | Status: DC | PRN

## 2016-08-09 ENCOUNTER — Other Ambulatory Visit: Payer: Federal, State, Local not specified - PPO

## 2016-08-09 ENCOUNTER — Ambulatory Visit (HOSPITAL_BASED_OUTPATIENT_CLINIC_OR_DEPARTMENT_OTHER): Payer: Federal, State, Local not specified - PPO

## 2016-08-09 VITALS — BP 119/80 | HR 86 | Temp 98.5°F | Resp 20

## 2016-08-09 DIAGNOSIS — Z23 Encounter for immunization: Secondary | ICD-10-CM

## 2016-08-09 MED ORDER — INFLUENZA VAC SPLIT QUAD 0.5 ML IM SUSY
0.5000 mL | PREFILLED_SYRINGE | Freq: Once | INTRAMUSCULAR | Status: AC
  Administered 2016-08-09: 0.5 mL via INTRAMUSCULAR
  Filled 2016-08-09: qty 0.5

## 2016-08-09 NOTE — Patient Instructions (Signed)
Influenza Virus Vaccine (Flucelvax) What is this medicine? INFLUENZA VIRUS VACCINE (in floo EN zuh VAHY ruhs vak SEEN) helps to reduce the risk of getting influenza also known as the flu. The vaccine only helps protect you against some strains of the flu. COMMON BRAND NAME(S): FLUCELVAX What should I tell my health care provider before I take this medicine? They need to know if you have any of these conditions: -bleeding disorder like hemophilia -fever or infection -Guillain-Barre syndrome or other neurological problems -immune system problems -infection with the human immunodeficiency virus (HIV) or AIDS -low blood platelet counts -multiple sclerosis -an unusual or allergic reaction to influenza virus vaccine, other medicines, foods, dyes or preservatives -pregnant or trying to get pregnant -breast-feeding How should I use this medicine? This vaccine is for injection into a muscle. It is given by a health care professional. A copy of Vaccine Information Statements will be given before each vaccination. Read this sheet carefully each time. The sheet may change frequently. Talk to your pediatrician regarding the use of this medicine in children. Special care may be needed. Overdosage: If you think you've taken too much of this medicine contact a poison control center or emergency room at once. What if I miss a dose? This does not apply. What may interact with this medicine? -chemotherapy or radiation therapy -medicines that lower your immune system like etanercept, anakinra, infliximab, and adalimumab -medicines that treat or prevent blood clots like warfarin -phenytoin -steroid medicines like prednisone or cortisone -theophylline -vaccines What should I watch for while using this medicine? Report any side effects that do not go away within 3 days to your doctor or health care professional. Call your health care provider if any unusual symptoms occur within 6 weeks of receiving this  vaccine. You may still catch the flu, but the illness is not usually as bad. You cannot get the flu from the vaccine. The vaccine will not protect against colds or other illnesses that may cause fever. The vaccine is needed every year. What side effects may I notice from receiving this medicine? Side effects that you should report to your doctor or health care professional as soon as possible: -allergic reactions like skin rash, itching or hives, swelling of the face, lips, or tongue Side effects that usually do not require medical attention (Report these to your doctor or health care professional if they continue or are bothersome.): -fever -headache -muscle aches and pains -pain, tenderness, redness, or swelling at the injection site -tiredness Where should I keep my medicine? The vaccine will be given by a health care professional in a clinic, pharmacy, doctor's office, or other health care setting. You will not be given vaccine doses to store at home.  2017 Elsevier/Gold Standard (2011-07-28 14:06:47)

## 2016-08-10 ENCOUNTER — Telehealth: Payer: Self-pay | Admitting: *Deleted

## 2016-08-10 ENCOUNTER — Other Ambulatory Visit: Payer: Self-pay | Admitting: Hematology

## 2016-08-10 ENCOUNTER — Telehealth: Payer: Self-pay

## 2016-08-10 NOTE — Telephone Encounter (Signed)
Received message regarding pt & husband having flu vax & not feeling well.  Returned call to pt & to husband.  Informed pt to come in tomorrow unless she is extremely sick & running fever tomorrow & we would assess for chemo.  OK for treatment despite husband being sick & should take appropriate precautions such as good handwashing & avoiding spread of germs.  Informed husband to talk with his MD.  Could be related to flu vax or he has a bug.

## 2016-08-10 NOTE — Telephone Encounter (Signed)
Flu shot yesterday. Before 9 pm she felt fine. 9 pm she took her nighttime meds with apple juice. 945 pm threw up. Used compazine and took away nausea.  temp was 98.5. This morning she is able to eat chicken bullion. Stomach is not queasy but jumpy. No diarrhea. Encouraged her to eat a little more than broth.   Her husband is congested with cough, weak, tired. Husband is at work and she does not know if sputum is colored or if he has a temp. She will call him and ask.   She is for 1st time cisplatin and gemzar tomorrow and she is concerned about compromised immune system and this event.   Orders for chemo are not yet in chart but in Dr Ernestina Penna notes.

## 2016-08-11 ENCOUNTER — Encounter: Payer: Self-pay | Admitting: *Deleted

## 2016-08-11 ENCOUNTER — Encounter: Payer: Self-pay | Admitting: Hematology

## 2016-08-11 ENCOUNTER — Ambulatory Visit: Payer: Federal, State, Local not specified - PPO | Admitting: Nutrition

## 2016-08-11 ENCOUNTER — Ambulatory Visit (HOSPITAL_BASED_OUTPATIENT_CLINIC_OR_DEPARTMENT_OTHER): Payer: Federal, State, Local not specified - PPO | Admitting: Hematology

## 2016-08-11 ENCOUNTER — Other Ambulatory Visit (HOSPITAL_BASED_OUTPATIENT_CLINIC_OR_DEPARTMENT_OTHER): Payer: Federal, State, Local not specified - PPO

## 2016-08-11 ENCOUNTER — Ambulatory Visit (HOSPITAL_BASED_OUTPATIENT_CLINIC_OR_DEPARTMENT_OTHER): Payer: Federal, State, Local not specified - PPO

## 2016-08-11 VITALS — BP 142/84 | HR 74 | Temp 99.2°F | Resp 20

## 2016-08-11 DIAGNOSIS — R1011 Right upper quadrant pain: Secondary | ICD-10-CM

## 2016-08-11 DIAGNOSIS — C221 Intrahepatic bile duct carcinoma: Secondary | ICD-10-CM | POA: Diagnosis not present

## 2016-08-11 DIAGNOSIS — Z5111 Encounter for antineoplastic chemotherapy: Secondary | ICD-10-CM | POA: Diagnosis not present

## 2016-08-11 DIAGNOSIS — C7801 Secondary malignant neoplasm of right lung: Principal | ICD-10-CM

## 2016-08-11 DIAGNOSIS — R911 Solitary pulmonary nodule: Secondary | ICD-10-CM | POA: Diagnosis not present

## 2016-08-11 LAB — COMPREHENSIVE METABOLIC PANEL
ALT: 20 U/L (ref 0–55)
AST: 24 U/L (ref 5–34)
Albumin: 3.9 g/dL (ref 3.5–5.0)
Alkaline Phosphatase: 49 U/L (ref 40–150)
Anion Gap: 12 mEq/L — ABNORMAL HIGH (ref 3–11)
BUN: 6.7 mg/dL — ABNORMAL LOW (ref 7.0–26.0)
CO2: 23 mEq/L (ref 22–29)
Calcium: 9.2 mg/dL (ref 8.4–10.4)
Chloride: 102 mEq/L (ref 98–109)
Creatinine: 0.7 mg/dL (ref 0.6–1.1)
EGFR: >90 mL/min/{1.73_m2} (ref >90)
Glucose: 79 mg/dl (ref 70–140)
Potassium: 3.6 mEq/L (ref 3.5–5.1)
Sodium: 136 mEq/L (ref 136–145)
Total Bilirubin: 0.36 mg/dL (ref 0.20–1.20)
Total Protein: 7.3 g/dL (ref 6.4–8.3)

## 2016-08-11 LAB — CBC WITH DIFFERENTIAL/PLATELET
BASO%: 0.9 % (ref 0.0–2.0)
Basophils Absolute: 0 10*3/uL (ref 0.0–0.1)
EOS%: 0.9 % (ref 0.0–7.0)
Eosinophils Absolute: 0 10*3/uL (ref 0.0–0.5)
HCT: 38.5 % (ref 34.8–46.6)
HGB: 12.8 g/dL (ref 11.6–15.9)
LYMPH%: 29.3 % (ref 14.0–49.7)
MCH: 27.7 pg (ref 25.1–34.0)
MCHC: 33.4 g/dL (ref 31.5–36.0)
MCV: 83.1 fL (ref 79.5–101.0)
MONO#: 0.3 10*3/uL (ref 0.1–0.9)
MONO%: 7.4 % (ref 0.0–14.0)
NEUT#: 2.2 10*3/uL (ref 1.5–6.5)
NEUT%: 61.5 % (ref 38.4–76.8)
Platelets: 216 10*3/uL (ref 145–400)
RBC: 4.63 10*6/uL (ref 3.70–5.45)
RDW: 13.6 % (ref 11.2–14.5)
WBC: 3.6 10*3/uL — ABNORMAL LOW (ref 3.9–10.3)
lymph#: 1.1 10*3/uL (ref 0.9–3.3)

## 2016-08-11 MED ORDER — SODIUM CHLORIDE 0.9 % IV SOLN
Freq: Once | INTRAVENOUS | Status: AC
  Administered 2016-08-11: 09:00:00 via INTRAVENOUS

## 2016-08-11 MED ORDER — SODIUM CHLORIDE 0.9 % IV SOLN
Freq: Once | INTRAVENOUS | Status: AC
  Administered 2016-08-11: 12:00:00 via INTRAVENOUS
  Filled 2016-08-11: qty 5

## 2016-08-11 MED ORDER — SODIUM CHLORIDE 0.9 % IV SOLN
1000.0000 mg/m2 | Freq: Once | INTRAVENOUS | Status: AC
  Administered 2016-08-11: 1748 mg via INTRAVENOUS
  Filled 2016-08-11: qty 45.97

## 2016-08-11 MED ORDER — HEPARIN SOD (PORK) LOCK FLUSH 100 UNIT/ML IV SOLN
500.0000 [IU] | Freq: Once | INTRAVENOUS | Status: AC | PRN
  Administered 2016-08-11: 500 [IU]
  Filled 2016-08-11: qty 5

## 2016-08-11 MED ORDER — SODIUM CHLORIDE 0.9 % IV SOLN
25.0000 mg/m2 | Freq: Once | INTRAVENOUS | Status: AC
  Administered 2016-08-11: 43 mg via INTRAVENOUS
  Filled 2016-08-11: qty 43

## 2016-08-11 MED ORDER — PALONOSETRON HCL INJECTION 0.25 MG/5ML
INTRAVENOUS | Status: AC
  Filled 2016-08-11: qty 5

## 2016-08-11 MED ORDER — SODIUM CHLORIDE 0.9% FLUSH
10.0000 mL | INTRAVENOUS | Status: DC | PRN
  Administered 2016-08-11: 10 mL
  Filled 2016-08-11: qty 10

## 2016-08-11 MED ORDER — POTASSIUM CHLORIDE 2 MEQ/ML IV SOLN
Freq: Once | INTRAVENOUS | Status: AC
  Administered 2016-08-11: 10:00:00 via INTRAVENOUS
  Filled 2016-08-11: qty 10

## 2016-08-11 MED ORDER — PALONOSETRON HCL INJECTION 0.25 MG/5ML
0.2500 mg | Freq: Once | INTRAVENOUS | Status: AC
  Administered 2016-08-11: 0.25 mg via INTRAVENOUS

## 2016-08-11 NOTE — Progress Notes (Signed)
[place] Work  Clinical Social Work was referred by patient navigator for assessment of psychosocial needs due to possible anxiety.  Clinical Social Worker met with patient at [facility] during infusion to offer support and assess for needs. CSW introduced self and reviewed Support Team/Programs available for support. CSW provided pt with information and handouts on options for support. Pt seemed relaxed and comfortable when CSW met with her. Pt shared possible financial concerns and CSW reviewed options through financial counselors. 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 CSW explained that most financial asst is based on federal poverty level and since pt was recently working she may not qualify. CSW and pt to meet again to review in further detail at a later date. Pt also plans to reach out to financial counselor to explore copay assistance options. CSW to follow.      Clinical Social Work interventions:  Resource education and referral  Supportive listening  Loren Racer, [place]  [place] Phone: [phone] Fax: [phone]

## 2016-08-11 NOTE — Progress Notes (Signed)
[name]  Telephone:(336) [phone] [phone]) [phone]  Clinic Follow Up Note   Patient Care Team: [name], DO as PCP - General Jerene Bears, MD as Consulting Physician (Gastroenterology) 08/11/2016  CHIEF COMPLAINTS:  Follow up metastatic intrahepatic cholangiocarcinoma  Oncology History   Metastatic cholangiocarcinoma to lung [facility])   Staging form: Perihilar Bile Ducts, AJCC 7th Edition   - Clinical stage from 07/07/2016: Stage IVB (T2b, N1, M1) - Signed by Truitt Merle, MD on 07/28/2016      Metastatic cholangiocarcinoma to lung ([name])   05/22/2014 Procedure    Routine screening colonoscopy showed a sessile polyp measuring 4 mm in the sigmoid colon, removed, otherwise negative.      06/29/2016 Imaging    CT chest, abdomen and pelvis showed 2 indeterminant nodule in left and the right lung, 4-22m, indeterminate a hypovascular liver lesions largest in the caudate lobe measuring 2.6 cm, mild abdominal lymphadenopathy in the gastrohepatic ligament and portocaval space.      07/02/2016 Imaging    Abdominal MRI with and without contrast showed 2 lobular lesions in the caudate lobe, and is regular lymph nodes in the gastrohepatic ligament which is adjacent to the liver lesion, suspicious for malignancy. 2 small lesions in the right hepatic lobe are indeterminate, but concerning for metastasis.      07/07/2016 Procedure    EGD was negative, EUS biopsy of the liver and adjacent lymph node      07/07/2016 Initial Diagnosis    Metastatic cholangiocarcinoma to lung ([place]      07/07/2016 Initial Biopsy    Fine-needle aspiration of the liver lesion in caudate lobe and adjacent lymph nodes both showed metastatic adenocarcinoma.      08/11/2016 -  Chemotherapy    Cisplatin 25 mg/m, gemcitabine 1000 mg/m, on day one and 8, every 21 days         HISTORY OF PRESENTING ILLNESS:  Nancy Berkshire89y.o. female is here because of Her recently diagnosed  metastatic glandular carcinoma. She is accompanied by her husband and mother to my clinic today.  She has been having RUQ abdominal pain since mid 04/2016, she was seen by PCP and was treated for gas with GI cocktail and pepcide, which she did not help much. Her pain got worse, and radiates to right shoulder, she denies significant nausea, or bloating, BM normal, no fever, cough or dyspnea. She recently noticed mild chest discomfort in the low sternum area. She initially had the lab, ultrasound done by her primary care physician, which was unrevealing. She was referred to GI Dr. PHilarie Fredricksonand underwent EGD and CT scan, which showed a lobulated mass in the caudate lobe of liver, with at adjacent adenopathy. He underwent EUS and fine-needle biopsy of the liver mass and lymph nodes, all reviewed adenocarcinoma.  She has lost about 7lbs in the past 3 months. She has mild fatigue, but able to function at home. She has stopped working due to her abdominal pain and fatigue. She takes Norco every 1-3 times a day, and her pain seems not well controlled.  CURRENT THERAPY: Cisplatin and gemcitabine on Day 1, 8 every 21 days  INTERIM HISTORY: LChantiareturns for follow up and her first dose chemotherapy. She still has mild to moderate right upper quadrant abdominal pain, appetite and energy level are moderate, no other new complaints. Her husband has developed cough and mild fever in the past few days, and he was seen by his primary care physician today.  Patient denies any cold symptoms or fever.  MEDICAL HISTORY:  Past Medical History:  Diagnosis Date  . Allergy    SEASONAL  . Anemia    HIGH SCHOOL  . Atopic eczema   . Fibrocystic breast disease   . GERD (gastroesophageal reflux disease)   . Hx of migraines    seasonal   . Pneumonia    6 years ago   . TIA (transient ischemic attack)     SURGICAL HISTORY: Past Surgical History:  Procedure Laterality Date  . EUS N/A 07/07/2016   Procedure: UPPER  ENDOSCOPIC ULTRASOUND (EUS) RADIAL;  Surgeon: [name], MD;  Location: [facility] ENDOSCOPY;  Service: Endoscopy;  Laterality: N/A;  . IR GENERIC HISTORICAL  08/03/2016   IR US GUIDE VASC ACCESS RIGHT 08/03/2016 Corrie Mckusick, DO [facility]-INTERV RAD  . IR GENERIC HISTORICAL  08/03/2016   IR FLUORO GUIDE PORT INSERTION RIGHT 08/03/2016 Corrie Mckusick, DO [facility]-INTER[facility]  . MYOMECTOMY  2002    SOCIAL HISTORY: Social History   Social History  . Marital status: Married    Spouse name: N/A  . Number of children: 0  . Years of education: 19   Occupational History  . [employer]  .  [employer]   Social History Main Topics  . Smoking status: Never Smoker  . Smokeless tobacco: Never Used  . Alcohol use Yes     Comment: maybe 3 times a year  . Drug use: No  . Sexual activity: Yes    Partners: Male   Other Topics Concern  . Not on file   Social History Narrative  . No narrative on file    FAMILY HISTORY: Family History  Problem Relation Age of Onset  . Gout Mother   . Diabetes Mother   . Hypertension Father   . Diabetes Father   . Breast cancer Paternal Aunt   . Cancer Maternal Grandfather     throat cancer   . Cancer Cousin 10    GI cancer   . Thyroid disease Neg Hx     ALLERGIES:  is allergic to penicillins and lipitor [atorvastatin].  MEDICATIONS:  Current Outpatient Prescriptions  Medication Sig Dispense Refill  . ALPRAZolam (XANAX) 0.5 MG tablet Take 1 tablet (0.5 mg total) by mouth as needed for anxiety. 10 tablet 0  . aspirin 81 MG tablet Take 81 mg by mouth daily.    Marland Kitchen CARAFATE 1 GM/10ML suspension TAKE 10 MLS (1 G TOTAL) BY MOUTH 4 (FOUR) TIMES DAILY - WITH MEALS AND AT BEDTIME. 420 mL 1  . cetirizine (ZYRTEC) 10 MG tablet Take 10 mg by mouth daily as needed. For allergies     . Cholecalciferol (VITAMIN D3) 5000 UNITS CAPS Take 1 capsule by mouth daily.    . Ciclopirox 1 % shampoo Use as directed 2x a week for up to 4 weeks 120 mL 1  . COLOSTRUM PO Take 1.25 g by  mouth 2 (two) times daily. 1.25 grams twice daily    . dicyclomine (BENTYL) 20 MG tablet Take 1 tablet (20 mg total) by mouth 3 (three) times daily as needed for spasms. 90 tablet 11  . HYDROcodone-acetaminophen (NORCO) 5-325 MG tablet Take 1-2 tablets by mouth every 6 (six) hours as needed for severe pain. 60 tablet 0  . lidocaine-prilocaine (EMLA) cream Apply 1 application topically as needed. Apply to portacath 1 1/2 hours - 2 hours prior to procedures as needed. 30 g 1  . meclizine (ANTIVERT) 50 MG  tablet Take 1 tablet (50 mg total) by mouth 3 (three) times daily as needed. 30 tablet 0  . Menaquinone-7 (VITAMIN K2) 40 MCG TABS Take by mouth.    . mometasone (ELOCON) 0.1 % lotion Apply topically daily. 60 mL 0  . NON FORMULARY 1 capsule 1 day or 1 dose. Pregnelone, pt reports she takes daily.    . Nutritional Supplements (DHEA) 15-50 MG CAPS Take 1 capsule by mouth 3 (three) times a week.    Marland Kitchen omeprazole (PRILOSEC) 40 MG capsule Take 1 capsule (40 mg total) by mouth 2 (two) times daily before a meal. 60 capsule 11  . ondansetron (ZOFRAN) 4 MG tablet Take 1 tablet (4 mg total) by mouth every 6 (six) hours. 12 tablet 0  . Prenatal Vit-Fe Fumarate-FA (PRENATAL MULTIVITAMIN) TABS tablet Take 2 tablets by mouth daily at 12 noon.    . prochlorperazine (COMPAZINE) 10 MG tablet Take 1 tablet (10 mg total) by mouth every 6 (six) hours as needed for nausea or vomiting. Cautions :  Can cause drowsiness. 30 tablet 0  . triamcinolone cream (KENALOG) 0.1 % Apply topically.    Marland Kitchen VITAMIN K PO Take 1 capsule by mouth daily.     No current facility-administered medications for this visit.    Facility-Administered Medications Ordered in Other Visits  Medication Dose Route Frequency Provider Last Rate Last Dose  . sodium chloride flush (NS) 0.9 % injection 10 mL  10 mL Intracatheter PRN Truitt Merle, MD   10 mL at 08/11/16 1651    REVIEW OF SYSTEMS:   Constitutional: Denies fevers, chills or abnormal night  sweats Eyes: Denies blurriness of vision, double vision or watery eyes Ears, nose, mouth, throat, and face: Denies mucositis or sore throat Respiratory: Denies cough, dyspnea or wheezes Cardiovascular: Denies palpitation, chest discomfort or lower extremity swelling Gastrointestinal:  Denies nausea, heartburn or change in bowel habits Skin: Denies abnormal skin rashes Lymphatics: Denies new lymphadenopathy or easy bruising Neurological:Denies numbness, tingling or new weaknesses Behavioral/Psych: Mood is stable, no new changes  All other systems were reviewed with the patient and are negative.  PHYSICAL EXAMINATION: ECOG PERFORMANCE STATUS: 1 - Symptomatic but completely ambulatory Blood pressure 142/84, heart rate 74, respiratory rate 20, temperature 37.5, pulse ox 99% on room air GENERAL:alert, no distress and comfortable SKIN: skin color, texture, turgor are normal, no rashes or significant lesions EYES: normal, conjunctiva are pink and non-injected, sclera clear OROPHARYNX:no exudate, no erythema and lips, buccal mucosa, and tongue normal  NECK: supple, thyroid normal size, non-tender, without nodularity LYMPH:  no palpable lymphadenopathy in the cervical, axillary or inguinal LUNGS: clear to auscultation and percussion with normal breathing effort HEART: regular rate & rhythm and no murmurs and no lower extremity edema ABDOMEN:abdomen soft, non-tender and normal bowel sounds Musculoskeletal:no cyanosis of digits and no clubbing  PSYCH: alert & oriented x 3 with fluent speech NEURO: no focal motor/sensory deficits  LABORATORY DATA:  I have reviewed the data as listed CBC Latest Ref Rng & Units 08/11/2016 08/03/2016 07/28/2016  WBC 3.9 - 10.3 10e3/uL 3.6(L) 5.7 4.8  Hemoglobin 11.6 - 15.9 g/dL 12.8 14.0 12.5  Hematocrit 34.8 - 46.6 % 38.5 41.2 38.4  Platelets 145 - 400 10e3/uL 216 273 255   CMP Latest Ref Rng & Units 08/11/2016 08/03/2016 07/28/2016  Glucose 70 - 140 mg/dl 79 94  92  BUN 7.0 - 26.0 mg/dL 6.7(L) 9 9.4  Creatinine 0.6 - 1.1 mg/dL 0.7 0.67 0.7  Sodium 136 - 145  mEq/L 136 140 140  Potassium 3.5 - 5.1 mEq/L 3.6 4.3 3.7  Chloride 101 - 111 mmol/L - 104 -  CO2 22 - 29 mEq/L '23 25 24  '$ Calcium 8.4 - 10.4 mg/dL 9.2 9.4 9.3  Total Protein 6.4 - 8.3 g/dL 7.3 - 7.1  Total Bilirubin 0.20 - 1.20 mg/dL 0.36 - 0.25  Alkaline Phos 40 - 150 U/L 49 - 45  AST 5 - 34 U/L 24 - 16  ALT 0 - 55 U/L 20 - 14   CA19.9 (0-35U/ml) 07/28/2016: 36  PATHOLOGY REPORT  Diagnosis 07/07/2016 FINE NEEDLE ASPIRATION, ENDOSCOPIC, LIVER (SPECIMEN 2 OF 2 COLLECTED 07/07/16): MALIGNANT CELLS CONSISTENT WITH ADENOCARCINOMA.  Diagnosis 07/07/2016 FINE NEEDLE ASPIRATION, ENDOSCOPIC, GASTRO-HEPATIC LYMPH NODE (SPECIMEN 1 OF 2 COLLECTED 07/07/16): MALIGNANT CELLS CONSISTENT WITH ADENOCARCINOMA, SEE COMMENT. Preliminary Diagnosis Intraoperative Diagnosis: Few atypical glandular clusters - recommend additional material. 4) Adequate ([facility])  RADIOGRAPHIC STUDIES: I have personally reviewed the radiological images as listed and agreed with the findings in the report. Ct Chest W Contrast  Result Date: 07/15/2016 CLINICAL DATA:  New diagnosis cholangiocarcinoma. Evaluate for metastatic disease EXAM: CT CHEST WITH CONTRAST TECHNIQUE: Multidetector CT imaging of the chest was performed during intravenous contrast administration. CONTRAST:  4m ISOVUE-300 IOPAMIDOL (ISOVUE-300) INJECTION 61% COMPARISON:  MRI 07/02/2016 FINDINGS: Cardiovascular:  Unremarkable Mediastinum/Nodes: No axillary or supraclavicular adenopathy. No mediastinal hilar no esophagus Lungs/Pleura: Round 6 mm nodule RIGHT middle lobe (image 60, series 3). Small peripheral nodule in the RIGHT middle lobe 4 mm (image 80, series). Rounded nodule in the LEFT upper lobe measures 5 mm (image 45, series 3) Upper Abdomen: Limited view of the liver, kidneys, pancreas are unremarkable. Low-density necrotic lesion in the caudate lobe. Periportal  adenopathy again demonstrated. See MRI complete description Musculoskeletal: No aggressive osseous lesion IMPRESSION: 1. Single round pulmonary nodule within each lung are concerning for PULMONARY METASTASIS. 2. Necrotic metastasis and adenopathy in the porta hepatis as described on comparison MRI Electronically Signed   By: SSuzy BouchardM.D.   On: 07/15/2016 14:09   Ir [place] Vasc Access Right  Result Date: 08/03/2016 INDICATION: 50year old female with a history of cholangiocarcinoma EXAM: IMPLANTED PORT A CATH PLACEMENT WITH ULTRASOUND AND FLUOROSCOPIC GUIDANCE MEDICATIONS: 1.0 g Ancef; The antibiotic was administered within an appropriate time interval prior to skin puncture. ANESTHESIA/SEDATION: Moderate (conscious) sedation was employed during this procedure. A total of Versed 6.0 mg and Fentanyl 100 mcg was administered intravenously. Moderate Sedation Time: 28 minutes. The patient's level of consciousness and vital signs were monitored continuously by radiology nursing throughout the procedure under my direct supervision. FLUOROSCOPY TIME:  Zero minutes, 24 seconds (1 mGy) COMPLICATIONS: None PROCEDURE: The procedure, risks, benefits, and alternatives were explained to the patient. Questions regarding the procedure were encouraged and answered. The patient understands and consents to the procedure. Ultrasound survey was performed with images stored and sent to PACs. The right neck and chest was prepped with chlorhexidine, and draped in the usual sterile fashion using maximum barrier technique (cap and mask, sterile gown, sterile gloves, large sterile sheet, hand hygiene and cutaneous antiseptic). Antibiotic prophylaxis was provided with 2.0g Ancef administered IV one hour prior to skin incision. Local anesthesia was attained by infiltration with 1% lidocaine without epinephrine. Ultrasound demonstrated patency of the right internal jugular vein, and this was documented with an image. Under  real-time ultrasound guidance, this vein was accessed with a 21 gauge micropuncture needle and image documentation was performed. A small dermatotomy was made at the  access site with an 11 scalpel. A 0.018" wire was advanced into the SVC and used to estimate the length of the internal catheter. The access needle exchanged for a 40F micropuncture vascular sheath. The 0.018" wire was then removed and a 0.035" wire advanced into the IVC. An appropriate location for the subcutaneous reservoir was selected below the clavicle and an incision was made through the skin and underlying soft tissues. The subcutaneous tissues were then dissected using a combination of blunt and sharp surgical technique and a pocket was formed. A single lumen power injectable portacatheter was then tunneled through the subcutaneous tissues from the pocket to the dermatotomy and the port reservoir placed within the subcutaneous pocket. The venous access site was then serially dilated and a peel away vascular sheath placed over the wire. The wire was removed and the port catheter advanced into position under fluoroscopic guidance. The catheter tip is positioned in the cavoatrial junction. This was documented with a spot image. The portacatheter was then tested and found to flush and aspirate well. The port was flushed with saline followed by 100 units/mL heparinized saline. The pocket was then closed in two layers using first subdermal inverted interrupted absorbable sutures followed by a running subcuticular suture. The epidermis was then sealed with Dermabond. The dermatotomy at the venous access site was also seal with Dermabond. Patient tolerated the procedure well and remained hemodynamically stable throughout. No complications encountered and no significant blood loss encountered IMPRESSION: Status post right IJ port catheter placement. Catheter ready for use. Signed, Dulcy Fanny. Earleen Newport, DO [facility]  [facility] Electronically Signed   By: Corrie Mckusick D.O.   On: 08/03/2016 15:56   Ir Fluoro Guide Port Insertion Right  Result Date: 08/03/2016 INDICATION: 50 year old female with a history of cholangiocarcinoma EXAM: IMPLANTED PORT A CATH PLACEMENT WITH ULTRASOUND AND FLUOROSCOPIC GUIDANCE MEDICATIONS: 1.0 g Ancef; The antibiotic was administered within an appropriate time interval prior to skin puncture. ANESTHESIA/SEDATION: Moderate (conscious) sedation was employed during this procedure. A total of Versed 6.0 mg and Fentanyl 100 mcg was administered intravenously. Moderate Sedation Time: 28 minutes. The patient's level of consciousness and vital signs were monitored continuously by radiology nursing throughout the procedure under my direct supervision. FLUOROSCOPY TIME:  Zero minutes, 24 seconds (1 mGy) COMPLICATIONS: None PROCEDURE: The procedure, risks, benefits, and alternatives were explained to the patient. Questions regarding the procedure were encouraged and answered. The patient understands and consents to the procedure. Ultrasound survey was performed with images stored and sent to PACs. The right neck and chest was prepped with chlorhexidine, and draped in the usual sterile fashion using maximum barrier technique (cap and mask, sterile gown, sterile gloves, large sterile sheet, hand hygiene and cutaneous antiseptic). Antibiotic prophylaxis was provided with 2.0g Ancef administered IV one hour prior to skin incision. Local anesthesia was attained by infiltration with 1% lidocaine without epinephrine. Ultrasound demonstrated patency of the right internal jugular vein, and this was documented with an image. Under real-time ultrasound guidance, this vein was accessed with a 21 gauge micropuncture needle and image documentation was performed. A small dermatotomy was made at the access site with an 11 scalpel. A 0.018" wire was advanced into the SVC and used to estimate the length of the  internal catheter. The access needle exchanged for a 40F micropuncture vascular sheath. The 0.018" wire was then removed and a 0.035" wire advanced into the IVC. An appropriate location for the subcutaneous reservoir was selected below the clavicle  and an incision was made through the skin and underlying soft tissues. The subcutaneous tissues were then dissected using a combination of blunt and sharp surgical technique and a pocket was formed. A single lumen power injectable portacatheter was then tunneled through the subcutaneous tissues from the pocket to the dermatotomy and the port reservoir placed within the subcutaneous pocket. The venous access site was then serially dilated and a peel away vascular sheath placed over the wire. The wire was removed and the port catheter advanced into position under fluoroscopic guidance. The catheter tip is positioned in the cavoatrial junction. This was documented with a spot image. The portacatheter was then tested and found to flush and aspirate well. The port was flushed with saline followed by 100 units/mL heparinized saline. The pocket was then closed in two layers using first subdermal inverted interrupted absorbable sutures followed by a running subcuticular suture. The epidermis was then sealed with Dermabond. The dermatotomy at the venous access site was also seal with Dermabond. Patient tolerated the procedure well and remained hemodynamically stable throughout. No complications encountered and no significant blood loss encountered IMPRESSION: Status post right IJ port catheter placement. Catheter ready for use. Signed, Dulcy Fanny. Earleen Newport, DO [facility] Electronically Signed   By: Corrie Mckusick D.O.   On: 08/03/2016 15:56   Upper EUS 07/07/2016 Dr. Ardis Hughs  - Endoscopically normal UGI tract (good views with standard gastroscope) - Vaguely bordered 4cm mass in the caudate lobe of liver that is confluent with  what appear to be enlarged gastrohepatic lymphnodes (these may represent direct tumor extension however). Preliminary cytology from the lymphnodes shows malignancy, glandular. Preliminary cytology from the liver mass shows the same. Await final cytology results but this is most suspicious for peripheral intrahepatic cholangiocarcinoma with adjacent malignant adenopathy.   COLONOSCOPY 05/22/2014 ENDOSCOPIC IMPRESSION: 1. Sessile polyp measuring 4 mm in size was found in the sigmoid colon; polypectomy was performed with a cold snare 2. The colon mucosa was otherwise normal  ASSESSMENT & PLAN:  50 year old African-American female, without significant past medical history, presented with abdominal pain  1. Metastatic intrahepatic clinical carcinoma, with probable lung metastasis, [id number], stage IV -I reviewed her CT, MRI, endoscopy findings and her biopsy results with patient and her family members in details. -We have reviewed her case in our GI tumor board this morning. Based on the scans and endoscopy findings, this is most consistent with clinical carcinoma. Her liver and inguinal biopsy showed adenocarcinoma, I have requested immunostain CDX20 to ruled out a metastatic colon cancer. She did have a screening colonoscopy 2 years ago which was negative. -She has 2 additional small liver lesions and 2 4-10m lung lesions, which are indeterminate but that is suspicious for metastatic disease. -She was seen by surgeon Dr. BBarry Dieneswho thinks she is a not a candidate for surgical resection due to her metastatic disease. -We discussed her cancer is incurable at this stage, the goal of therapy is palliation, to prolong life and improve her quality life -I recommend her to start systemic chemotherapy, with first-line cisplatin and gemcitabine, on day 1, 8 every 21 days.  -Lab reviewed, adequate for treatment, we'll start cycle 1 chemotherapy today -Potential side effects from chemotherapy were reviewed  with patient and can -Her husband has developed a cold in the past few days, I recommend her to wear a mask at home  -She knows to use Compazine or Zofran as needed for nausea at home  2. Abdominal pain -  continue hydrocodone/acetaminophen every 4-6 hours as needed for pain control  -Constipation and management reviewed with patient  Plan -first cycle cisplatin and gemcitabine today  -Lab, flushed, follow-up and chemotherapy in 1 week  No orders of the defined types were placed in this encounter.   All questions were answered. The patient knows to call the clinic with any problems, questions or concerns.  I spent 20 minutes counseling the patient face to face. The total time spent in the appointment was 25 minutes and more than 50% was on counseling.     Truitt Merle, MD 08/11/2016 7:25 PM

## 2016-08-11 NOTE — Patient Instructions (Addendum)
[facility] Discharge Instructions for Patients Receiving Chemotherapy  Today you received the following chemotherapy agents Gemzar and Cisplatin  To help prevent nausea and vomiting after your treatment, we encourage you to take your nausea medication as prescribed.   If you develop nausea and vomiting that is not controlled by your nausea medication, call the clinic.   BELOW ARE SYMPTOMS THAT SHOULD BE REPORTED IMMEDIATELY:  *FEVER GREATER THAN 100.5 F  *CHILLS WITH OR WITHOUT FEVER  NAUSEA AND VOMITING THAT IS NOT CONTROLLED WITH YOUR NAUSEA MEDICATION  *UNUSUAL SHORTNESS OF BREATH  *UNUSUAL BRUISING OR BLEEDING  TENDERNESS IN MOUTH AND THROAT WITH OR WITHOUT PRESENCE OF ULCERS  *URINARY PROBLEMS  *BOWEL PROBLEMS  UNUSUAL RASH Items with * indicate a potential emergency and should be followed up as soon as possible.  Feel free to call the clinic you have any questions or concerns. The clinic phone number is [phone].  Please show the [facility] at check-in to the Emergency Department and triage nurse.  Cisplatin injection What is this medicine? CISPLATIN (SIS pla tin) is a chemotherapy drug. It targets fast dividing cells, like cancer cells, and causes these cells to die. This medicine is used to treat many types of cancer like bladder, ovarian, and testicular cancers. This medicine may be used for other purposes; ask your health care provider or pharmacist if you have questions. COMMON BRAND NAME(S): Platinol, Platinol -AQ What should I tell my health care provider before I take this medicine? They need to know if you have any of these conditions: -blood disorders -hearing problems -kidney disease -recent or ongoing radiation therapy -an unusual or allergic reaction to cisplatin, carboplatin, other chemotherapy, other medicines, foods, dyes, or preservatives -pregnant or trying to get pregnant -breast-feeding How should I use this  medicine? This drug is given as an infusion into a vein. It is administered in a hospital or clinic by a specially trained health care professional. Talk to your pediatrician regarding the use of this medicine in children. Special care may be needed. Overdosage: If you think you have taken too much of this medicine contact a poison control center or emergency room at once. NOTE: This medicine is only for you. Do not share this medicine with others. What if I miss a dose? It is important not to miss a dose. Call your doctor or health care professional if you are unable to keep an appointment. What may interact with this medicine? -dofetilide -foscarnet -medicines for seizures -medicines to increase blood counts like filgrastim, pegfilgrastim, sargramostim -probenecid -pyridoxine used with altretamine -rituximab -some antibiotics like amikacin, gentamicin, neomycin, polymyxin B, streptomycin, tobramycin -sulfinpyrazone -vaccines -zalcitabine Talk to your doctor or health care professional before taking any of these medicines: -acetaminophen -aspirin -ibuprofen -ketoprofen -naproxen This list may not describe all possible interactions. Give your health care provider a list of all the medicines, herbs, non-prescription drugs, or dietary supplements you use. Also tell them if you smoke, drink alcohol, or use illegal drugs. Some items may interact with your medicine. What should I watch for while using this medicine? Your condition will be monitored carefully while you are receiving this medicine. You will need important blood work done while you are taking this medicine. This drug may make you feel generally unwell. This is not uncommon, as chemotherapy can affect healthy cells as well as cancer cells. Report any side effects. Continue your course of treatment even though you feel ill unless your doctor tells you to  stop. In some cases, you may be given additional medicines to help with side  effects. Follow all directions for their use. Call your doctor or health care professional for advice if you get a fever, chills or sore throat, or other symptoms of a cold or flu. Do not treat yourself. This drug decreases your body's ability to fight infections. Try to avoid being around people who are sick. This medicine may increase your risk to bruise or bleed. Call your doctor or health care professional if you notice any unusual bleeding. Be careful brushing and flossing your teeth or using a toothpick because you may get an infection or bleed more easily. If you have any dental work done, tell your dentist you are receiving this medicine. Avoid taking products that contain aspirin, acetaminophen, ibuprofen, naproxen, or ketoprofen unless instructed by your doctor. These medicines may hide a fever. Do not become pregnant while taking this medicine. Women should inform their doctor if they wish to become pregnant or think they might be pregnant. There is a potential for serious side effects to an unborn child. Talk to your health care professional or pharmacist for more information. Do not breast-feed an infant while taking this medicine. Drink fluids as directed while you are taking this medicine. This will help protect your kidneys. Call your doctor or health care professional if you get diarrhea. Do not treat yourself. What side effects may I notice from receiving this medicine? Side effects that you should report to your doctor or health care professional as soon as possible: -allergic reactions like skin rash, itching or hives, swelling of the face, lips, or tongue -signs of infection - fever or chills, cough, sore throat, pain or difficulty passing urine -signs of decreased platelets or bleeding - bruising, pinpoint red spots on the skin, black, tarry stools, nosebleeds -signs of decreased red blood cells - unusually weak or tired, fainting spells, lightheadedness -breathing  problems -changes in hearing -gout pain -low blood counts - This drug may decrease the number of white blood cells, red blood cells and platelets. You may be at increased risk for infections and bleeding. -nausea and vomiting -pain, swelling, redness or irritation at the injection site -pain, tingling, numbness in the hands or feet -problems with balance, movement -trouble passing urine or change in the amount of urine Side effects that usually do not require medical attention (report to your doctor or health care professional if they continue or are bothersome): -changes in vision -loss of appetite -metallic taste in the mouth or changes in taste This list may not describe all possible side effects. Call your doctor for medical advice about side effects. You may report side effects to FDA at 1-800-FDA-1088. Where should I keep my medicine? This drug is given in a hospital or clinic and will not be stored at home. NOTE: This sheet is a summary. It may not cover all possible information. If you have questions about this medicine, talk to your doctor, pharmacist, or health care provider.  2017 Elsevier/Gold Standard (2007-11-21 14:40:54)  Gemcitabine injection What is this medicine? GEMCITABINE (jem SIT a been) is a chemotherapy drug. This medicine is used to treat many types of cancer like breast cancer, lung cancer, pancreatic cancer, and ovarian cancer. This medicine may be used for other purposes; ask your health care provider or pharmacist if you have questions. COMMON BRAND NAME(S): Gemzar What should I tell my health care provider before I take this medicine? They need to know if you have  any of these conditions: -blood disorders -infection -kidney disease -liver disease -recent or ongoing radiation therapy -an unusual or allergic reaction to gemcitabine, other chemotherapy, other medicines, foods, dyes, or preservatives -pregnant or trying to get pregnant -breast-feeding How  should I use this medicine? This drug is given as an infusion into a vein. It is administered in a hospital or clinic by a specially trained health care professional. Talk to your pediatrician regarding the use of this medicine in children. Special care may be needed. Overdosage: If you think you have taken too much of this medicine contact a poison control center or emergency room at once. NOTE: This medicine is only for you. Do not share this medicine with others. What if I miss a dose? It is important not to miss your dose. Call your doctor or health care professional if you are unable to keep an appointment. What may interact with this medicine? -medicines to increase blood counts like filgrastim, pegfilgrastim, sargramostim -some other chemotherapy drugs like cisplatin -vaccines Talk to your doctor or health care professional before taking any of these medicines: -acetaminophen -aspirin -ibuprofen -ketoprofen -naproxen This list may not describe all possible interactions. Give your health care provider a list of all the medicines, herbs, non-prescription drugs, or dietary supplements you use. Also tell them if you smoke, drink alcohol, or use illegal drugs. Some items may interact with your medicine. What should I watch for while using this medicine? Visit your doctor for checks on your progress. This drug may make you feel generally unwell. This is not uncommon, as chemotherapy can affect healthy cells as well as cancer cells. Report any side effects. Continue your course of treatment even though you feel ill unless your doctor tells you to stop. In some cases, you may be given additional medicines to help with side effects. Follow all directions for their use. Call your doctor or health care professional for advice if you get a fever, chills or sore throat, or other symptoms of a cold or flu. Do not treat yourself. This drug decreases your body's ability to fight infections. Try to avoid  being around people who are sick. This medicine may increase your risk to bruise or bleed. Call your doctor or health care professional if you notice any unusual bleeding. Be careful brushing and flossing your teeth or using a toothpick because you may get an infection or bleed more easily. If you have any dental work done, tell your dentist you are receiving this medicine. Avoid taking products that contain aspirin, acetaminophen, ibuprofen, naproxen, or ketoprofen unless instructed by your doctor. These medicines may hide a fever. Women should inform their doctor if they wish to become pregnant or think they might be pregnant. There is a potential for serious side effects to an unborn child. Talk to your health care professional or pharmacist for more information. Do not breast-feed an infant while taking this medicine. What side effects may I notice from receiving this medicine? Side effects that you should report to your doctor or health care professional as soon as possible: -allergic reactions like skin rash, itching or hives, swelling of the face, lips, or tongue -low blood counts - this medicine may decrease the number of white blood cells, red blood cells and platelets. You may be at increased risk for infections and bleeding. -signs of infection - fever or chills, cough, sore throat, pain or difficulty passing urine -signs of decreased platelets or bleeding - bruising, pinpoint red spots on the skin, black,  tarry stools, blood in the urine -signs of decreased red blood cells - unusually weak or tired, fainting spells, lightheadedness -breathing problems -chest pain -mouth sores -nausea and vomiting -pain, swelling, redness at site where injected -pain, tingling, numbness in the hands or feet -stomach pain -swelling of ankles, feet, hands -unusual bleeding Side effects that usually do not require medical attention (report to your doctor or health care professional if they continue or  are bothersome): -constipation -diarrhea -hair loss -loss of appetite -stomach upset This list may not describe all possible side effects. Call your doctor for medical advice about side effects. You may report side effects to FDA at 1-800-FDA-1088. Where should I keep my medicine? This drug is given in a hospital or clinic and will not be stored at home. NOTE: This sheet is a summary. It may not cover all possible information. If you have questions about this medicine, talk to your doctor, pharmacist, or health care provider.  2017 Elsevier/Gold Standard (2007-12-26 18:45:54)

## 2016-08-11 NOTE — Progress Notes (Signed)
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  Past medical history includes TIA, pneumonia, migraines, GERD, and anemia.  Medications include Xanax, Carafate, vitamin D3 colostrum, Prilosec, Zofran, prenatal vitamins, Compazine, and vitamin K.  Labs were reviewed.  Height: 5 feet 6 inches. Weight: 142 pounds, December 5. Usual body weight: 158 pounds July 2017. BMI: 22.98.  Patient was told by GI physician to follow a bland, no citrus diet secondary to potential ulcer.  This was prior to current diagnosis. Patient reports cramping and pain with oral intake. She has nausea but it is improved when she takes nausea medicine. She does not drink milk but tolerates almond milk. Patient has lost 10% of her usual body weight. Physical exam was deferred secondary to increased pain.  Nutrition diagnosis:  Unintended weight loss related to inadequate oral intake as evidenced by 10% weight loss over 5 months.  Intervention: I educated patient on sources of high calorie and high protein foods. Recommended patient consume high-protein almond milk. Encouraged small frequent meals and snacks. Encouraged patient to continue to avoid foods that created painful cramping. Provided fact sheets on increasing calories and protein and eating with nausea and vomiting. Questions were answered.  Teach back method used.  Contact information was given.  Monitoring, evaluation, goals: Patient will tolerate adequate calories and protein to minimize further weight loss.  Next visit: Wednesday, December 20, during infusion.  **Disclaimer: This note was dictated with voice recognition software. Similar sounding words can inadvertently be transcribed and this note may contain transcription errors which may not have been corrected upon publication of note.**

## 2016-08-11 NOTE — Progress Notes (Signed)
Oncology Nurse Navigator Documentation  Oncology Nurse Navigator Flowsheets 08/11/2016  Navigator Location [facility]  Referral date to RadOnc/MedOnc -  Navigator Encounter Type Treatment  Abnormal Finding Date -  Confirmed Diagnosis Date -  Treatment Initiated Date 08/11/2016  Patient Visit Type MedOnc  Treatment Phase First Chemo Tx--CDDP/Gemzar  Barriers/Navigation Needs Education  Education Other--importance of fluids and urine output/ discussed her lab results today-normal; discussed precautions to take around her husband who is currently ill with possible flu.  Interventions Education  Referrals -  Coordination of Care -  Education Method Written;Verbal  Support Groups/Services -  Acuity Level 1  Time Spent with Patient 15

## 2016-08-14 ENCOUNTER — Emergency Department (HOSPITAL_BASED_OUTPATIENT_CLINIC_OR_DEPARTMENT_OTHER)
Admission: EM | Admit: 2016-08-14 | Discharge: 2016-08-14 | Disposition: A | Discharge status: Home / Self Care | Payer: Federal, State, Local not specified - PPO | Attending: Emergency Medicine | Admitting: Emergency Medicine

## 2016-08-14 ENCOUNTER — Encounter (HOSPITAL_BASED_OUTPATIENT_CLINIC_OR_DEPARTMENT_OTHER): Payer: Self-pay | Admitting: Emergency Medicine

## 2016-08-14 DIAGNOSIS — N3000 Acute cystitis without hematuria: Secondary | ICD-10-CM | POA: Insufficient documentation

## 2016-08-14 DIAGNOSIS — E876 Hypokalemia: Secondary | ICD-10-CM | POA: Insufficient documentation

## 2016-08-14 DIAGNOSIS — Z7982 Long term (current) use of aspirin: Secondary | ICD-10-CM | POA: Diagnosis not present

## 2016-08-14 DIAGNOSIS — R300 Dysuria: Secondary | ICD-10-CM | POA: Diagnosis present

## 2016-08-14 LAB — CBC WITH DIFFERENTIAL/PLATELET
Basophils Absolute: 0 10*3/uL (ref 0.0–0.1)
Basophils Relative: 0 %
Eosinophils Absolute: 0 10*3/uL (ref 0.0–0.7)
Eosinophils Relative: 1 %
HCT: 35.9 % — ABNORMAL LOW (ref 36.0–46.0)
Hemoglobin: 12.3 g/dL (ref 12.0–15.0)
Lymphocytes Relative: 43 %
Lymphs Abs: 2 10*3/uL (ref 0.7–4.0)
MCH: 28.2 pg (ref 26.0–34.0)
MCHC: 34.3 g/dL (ref 30.0–36.0)
MCV: 82.3 fL (ref 78.0–100.0)
Monocytes Absolute: 0 10*3/uL — ABNORMAL LOW (ref 0.1–1.0)
Monocytes Relative: 1 %
Neutro Abs: 2.6 10*3/uL (ref 1.7–7.7)
Neutrophils Relative %: 55 %
Platelets: 232 10*3/uL (ref 150–400)
RBC: 4.36 MIL/uL (ref 3.87–5.11)
RDW: 12.5 % (ref 11.5–15.5)
WBC: 4.7 10*3/uL (ref 4.0–10.5)

## 2016-08-14 LAB — COMPREHENSIVE METABOLIC PANEL
ALT: 25 U/L (ref 14–54)
AST: 27 U/L (ref 15–41)
Albumin: 4.2 g/dL (ref 3.5–5.0)
Alkaline Phosphatase: 34 U/L — ABNORMAL LOW (ref 38–126)
Anion gap: 11 (ref 5–15)
BUN: 8 mg/dL (ref 6–20)
CO2: 24 mmol/L (ref 22–32)
Calcium: 8.9 mg/dL (ref 8.9–10.3)
Chloride: 102 mmol/L (ref 101–111)
Creatinine, Ser: 0.67 mg/dL (ref 0.44–1.00)
GFR calc Af Amer: >60 mL/min (ref >60)
GFR calc non Af Amer: >60 mL/min (ref >60)
Glucose, Bld: 89 mg/dL (ref 65–99)
Potassium: 3.2 mmol/L — ABNORMAL LOW (ref 3.5–5.1)
Sodium: 137 mmol/L (ref 135–145)
Total Bilirubin: 0.8 mg/dL (ref 0.3–1.2)
Total Protein: 6.8 g/dL (ref 6.5–8.1)

## 2016-08-14 LAB — URINALYSIS, ROUTINE W REFLEX MICROSCOPIC
Bilirubin Urine: NEGATIVE
Glucose, UA: NEGATIVE mg/dL
Hgb urine dipstick: NEGATIVE
Ketones, ur: 40 mg/dL — AB
Leukocytes, UA: NEGATIVE
Nitrite: NEGATIVE
Protein, ur: NEGATIVE mg/dL
Specific Gravity, Urine: 1.005 (ref 1.005–1.030)
pH: 7 (ref 5.0–8.0)

## 2016-08-14 MED ORDER — ONDANSETRON 4 MG PO TBDP
ORAL_TABLET | ORAL | Status: AC
  Administered 2016-08-14: 4 mg
  Filled 2016-08-14: qty 1

## 2016-08-14 MED ORDER — OXYCODONE-ACETAMINOPHEN 5-325 MG PO TABS
2.0000 | ORAL_TABLET | Freq: Once | ORAL | Status: AC
  Administered 2016-08-14: 2 via ORAL
  Filled 2016-08-14: qty 2

## 2016-08-14 MED ORDER — SODIUM CHLORIDE 0.9 % IV BOLUS (SEPSIS)
1000.0000 mL | Freq: Once | INTRAVENOUS | Status: AC
  Administered 2016-08-14: 1000 mL via INTRAVENOUS

## 2016-08-14 MED ORDER — SULFAMETHOXAZOLE-TRIMETHOPRIM 800-160 MG PO TABS: 1.0000 | ORAL_TABLET | Freq: Two times a day (BID) | ORAL | 0 refills | Status: AC

## 2016-08-14 MED ORDER — POTASSIUM CHLORIDE CRYS ER 20 MEQ PO TBCR
40.0000 meq | EXTENDED_RELEASE_TABLET | Freq: Once | ORAL | Status: AC
  Administered 2016-08-14: 40 meq via ORAL
  Filled 2016-08-14: qty 2

## 2016-08-14 NOTE — ED Triage Notes (Signed)
Pt had 1st round of chemo on weds.  States yesterday she felt nauseous and has not been able to eat since due to lack of appetite.

## 2016-08-14 NOTE — ED Notes (Signed)
States has had dysuria today and no appetite since receiving 1st chemo tx on Wed.

## 2016-08-14 NOTE — Discharge Instructions (Signed)
Take the antibitoics. ENSURE YOU HYDRATE WELL AND GET GOOD NUTRITION.  Please return to the ER if your symptoms worsen; you have increased pain, fevers, chills, inability to keep any medications down, confusion. Otherwise see the outpatient doctor as requested.

## 2016-08-14 NOTE — ED Provider Notes (Signed)
[facility] Provider Note   CSN: [id number] Arrival date & time: 08/14/16  1323     History   Chief Complaint Chief Complaint  Patient presents with  . Fatigue  . Dysuria    HPI Nancy [name] is a 50 y.o. female.  HPI Pt comes in with cc of weakness, nausea. Pt has hx of cholangiocarcinoma. Pt reports that she had her 1st chemo 3 days ago, and yday she completely lost appetite and has nausea. She also now has some pressure like feeling when she has to urinate. Pt denies any blood in the urine. Pt has pain is the lower abdomen, which is new. No vaginal discharge, bleeding. Pt has no hx of STD or concerns for STD. Pt has no hx of pelvic disorders.    Past Medical History:  Diagnosis Date  . Allergy    SEASONAL  . Anemia    [employer]  . Atopic eczema   . Fibrocystic breast disease   . GERD (gastroesophageal reflux disease)   . Hx of migraines    seasonal   . Pneumonia    6 years ago   . TIA (transient ischemic attack)     Patient Active Problem List   Diagnosis Date Noted  . Abnormal finding on GI tract imaging   . Metastatic cholangiocarcinoma to lung ([place])   . Acute bronchitis 10/01/2015  . Vestibular migraine 06/20/2015  . TIA (transient ischemic attack) 06/12/2015  . Hyperlipidemia LDL goal <70 06/12/2015  . Diplopia 06/09/2015  . Vertigo 06/09/2015  . Right leg pain 04/12/2014  . Hyperthyroidism 03/13/2014  . MAMMOGRAM, ABNORMAL 10/31/2008  . ECZEMA, ATOPIC 10/15/2008  . FIBROCYSTIC BREAST DISEASE, HX OF 10/15/2008    Past Surgical History:  Procedure Laterality Date  . EUS N/A 07/07/2016   Procedure: UPPER ENDOSCOPIC ULTRASOUND (EUS) RADIAL;  Surgeon: [name], MD;  Location: [facility];  Service: Endoscopy;  Laterality: N/A;  . IR GENERIC HISTORICAL  08/03/2016   IR US GUIDE VASC ACCESS RIGHT 08/03/2016 Corrie Mckusick, DO [facility]  . IR GENERIC HISTORICAL  08/03/2016   IR FLUORO GUIDE PORT INSERTION RIGHT 08/03/2016 Corrie Mckusick, DO [facility]  . MYOMECTOMY  2002    OB History    No data available       Home Medications    Prior to Admission medications   Medication Sig Start Date End Date Taking? Authorizing Provider  ALPRAZolam Duanne Moron) 0.5 MG tablet Take 1 tablet (0.5 mg total) by mouth as needed for anxiety. 06/23/16  Yes Jerene Bears, MD  aspirin 81 MG tablet Take 81 mg by mouth daily.   Yes Historical Provider, MD  CARAFATE 1 GM/10ML suspension TAKE 10 MLS (1 G TOTAL) BY MOUTH 4 (FOUR) TIMES DAILY - WITH MEALS AND AT BEDTIME. 07/26/16  Yes Jerene Bears, MD  cetirizine (ZYRTEC) 10 MG tablet Take 10 mg by mouth daily as needed. For allergies    Yes Historical Provider, MD  Cholecalciferol (VITAMIN D3) 5000 UNITS CAPS Take 1 capsule by mouth daily.   Yes Historical Provider, MD  Ciclopirox 1 % shampoo Use as directed 2x a week for up to 4 weeks 08/21/15  Yes Yvonne R Lowne Chase, DO  COLOSTRUM PO Take 1.25 g by mouth 2 (two) times daily. 1.25 grams twice daily   Yes Historical Provider, MD  HYDROcodone-acetaminophen (NORCO) 5-325 MG tablet Take 1-2 tablets by mouth every 6 (six) hours as needed for severe pain. 08/03/16  Yes Ulice Dash  Everitt Amber, MD  lidocaine-prilocaine (EMLA) cream Apply 1 application topically as needed. Apply to portacath 1 1/2 hours - 2 hours prior to procedures as needed. 08/06/16  Yes Truitt Merle, MD  meclizine (ANTIVERT) 50 MG tablet Take 1 tablet (50 mg total) by mouth 3 (three) times daily as needed. 08/29/15  Yes Yvonne R Lowne Chase, DO  Menaquinone-7 (VITAMIN K2) 40 MCG TABS Take by mouth.   Yes Historical Provider, MD  mometasone (ELOCON) 0.1 % lotion Apply topically daily. 08/21/15  Yes Yvonne R Lowne Chase, DO  NON FORMULARY 1 capsule 1 day or 1 dose. Pregnelone, pt reports she takes daily.   Yes Historical Provider, MD  Nutritional Supplements (DHEA) 15-50 MG CAPS Take 1 capsule by mouth 3 (three) times a week.   Yes Historical Provider, MD  omeprazole (PRILOSEC) 40 MG capsule  Take 1 capsule (40 mg total) by mouth 2 (two) times daily before a meal. 06/23/16  Yes Jerene Bears, MD  Prenatal Vit-Fe Fumarate-FA (PRENATAL MULTIVITAMIN) TABS tablet Take 2 tablets by mouth daily at 12 noon.   Yes Historical Provider, MD  prochlorperazine (COMPAZINE) 10 MG tablet Take 1 tablet (10 mg total) by mouth every 6 (six) hours as needed for nausea or vomiting. Cautions :  Can cause drowsiness. 08/02/16  Yes Truitt Merle, MD  triamcinolone cream (KENALOG) 0.1 % Apply topically.   Yes Historical Provider, MD  VITAMIN K PO Take 1 capsule by mouth daily.   Yes Historical Provider, MD  ondansetron (ZOFRAN) 4 MG tablet Take 1 tablet (4 mg total) by mouth every 6 (six) hours. 09/28/15   Comer Locket, PA-C  sulfamethoxazole-trimethoprim (BACTRIM DS,SEPTRA DS) 800-160 MG tablet Take 1 tablet by mouth 2 (two) times daily. 08/14/16 08/17/16  Varney Biles, MD    Family History Family History  Problem Relation Age of Onset  . Gout Mother   . Diabetes Mother   . Hypertension Father   . Diabetes Father   . Breast cancer Paternal Aunt   . Cancer Maternal Grandfather     throat cancer   . Cancer Cousin 19    GI cancer   . Thyroid disease Neg Hx     Social History Social History  Substance Use Topics  . Smoking status: Never Smoker  . Smokeless tobacco: Never Used  . Alcohol use Yes     Comment: maybe 3 times a year     Allergies   Penicillins and Lipitor [atorvastatin]   Review of Systems Review of Systems  ROS 10 Systems reviewed and are negative for acute change except as noted in the HPI.     Physical Exam Updated Vital Signs BP 147/90 (BP Location: Left Arm)   Pulse 79   Temp 98.1 F (36.7 C) (Oral)   Resp 20   Ht '5\' 6"'$  (1.676 m)   Wt 142 lb (64.4 kg)   LMP 05/18/2016 (Approximate)   SpO2 100%   BMI 22.92 kg/m   Physical Exam  Constitutional: She is oriented to person, place, and time. She appears well-developed and well-nourished.  HENT:  Head:  Normocephalic and atraumatic.  Dry mucosa  Eyes: EOM are normal. Pupils are equal, round, and reactive to light.  Neck: Neck supple.  Cardiovascular: Normal rate, regular rhythm and normal heart sounds.   No murmur heard. Pulmonary/Chest: Effort normal. No respiratory distress.  Abdominal: Soft. She exhibits no distension. There is no tenderness. There is no rebound and no guarding.  Neurological: She is alert and oriented  to person, place, and time.  Skin: Skin is warm and dry.  Nursing note and vitals reviewed.    ED Treatments / Results  Labs (all labs ordered are listed, but only abnormal results are displayed) Labs Reviewed  URINALYSIS, ROUTINE W REFLEX MICROSCOPIC - Abnormal; Notable for the following:       Result Value   Ketones, ur 40 (*)    All other components within normal limits  CBC WITH DIFFERENTIAL/PLATELET - Abnormal; Notable for the following:    HCT 35.9 (*)    Monocytes Absolute 0.0 (*)    All other components within normal limits  COMPREHENSIVE METABOLIC PANEL - Abnormal; Notable for the following:    Potassium 3.2 (*)    Alkaline Phosphatase 34 (*)    All other components within normal limits  URINE CULTURE    EKG  EKG Interpretation None       Radiology No results found.  Procedures Procedures (including critical care time)  Medications Ordered in ED Medications  sodium chloride 0.9 % bolus 1,000 mL (0 mLs Intravenous Stopped 08/14/16 1615)  oxyCODONE-acetaminophen (PERCOCET/ROXICET) 5-325 MG per tablet 2 tablet (2 tablets Oral Given 08/14/16 1616)  potassium chloride SA (K-DUR,KLOR-CON) CR tablet 40 mEq (40 mEq Oral Given 08/14/16 1616)  ondansetron (ZOFRAN-ODT) 4 MG disintegrating tablet (4 mg  Given 08/14/16 1616)     Initial Impression / Assessment and Plan / ED Course  I have reviewed the triage vital signs and the nursing notes.  Pertinent labs & imaging results that were available during my care of the patient were reviewed by me  and considered in my medical decision making (see chart for details).  Clinical Course as of Aug 14 1630  Sat Aug 14, 2016  1631 Pt reassessed. Pt's VSS and WNL. Pt's cap refill < 3 seconds. Pt has been hydrated in the ER and now passed po challenge. We will discharge with antiemetic. Strict ER return precautions have been discussed and pt will return if he is unable to tolerate fluids and symptoms are getting worse.   [AN]    Clinical Course User Index [AN] Varney Biles, MD    Pt comes in with cc of nausea, feeling like she is dehydrated and anorexia - all related to her chemo. She has some discomfort with urination - we will tx her as UTI. Pt is allergic to penicillin - listed as anaphylaxis, so we will give bactrim, which is suboptimal. Urine cultures sent.  Pt will get iv hydration, basic labs will be checked and oral challenge started.   Final Clinical Impressions(s) / ED Diagnoses   Final diagnoses:  Acute cystitis without hematuria  Hypokalemia    New Prescriptions New Prescriptions   SULFAMETHOXAZOLE-TRIMETHOPRIM (BACTRIM DS,SEPTRA DS) 800-160 MG TABLET    Take 1 tablet by mouth 2 (two) times daily.     Varney Biles, MD 08/14/16 [phone]

## 2016-08-16 ENCOUNTER — Telehealth: Payer: Self-pay | Admitting: *Deleted

## 2016-08-16 MED ORDER — ONDANSETRON HCL 8 MG PO TABS: 8.0000 mg | ORAL_TABLET | Freq: Three times a day (TID) | ORAL | 1 refills | Status: DC | PRN

## 2016-08-16 NOTE — Telephone Encounter (Signed)
Received call this am from caregiver, Nunzio Cory stating pt was seen in ED over the W/E & would like a call back.  Returned call & she states pt was weak, poor appetite & not eating much, nauseated , vomited once last night, & had some urine symptoms.  She was started on an ATB & is taking.  She has compazine which she is taking but not helping her nausea much.  Discussed trying to take in clear liquids only & pushing.  Discussed with Dr Burr Medico & will call in Zofran & give instructions to take q 8 hours prn & if clear liq. tol may go to full liq & then advance as tol.  Instructed to call if unable to get in liquids & vomiting cont & may need IVF.

## 2016-08-17 LAB — URINE CULTURE: Culture: NO GROWTH

## 2016-08-18 ENCOUNTER — Encounter: Payer: Self-pay | Admitting: Hematology

## 2016-08-18 ENCOUNTER — Telehealth: Payer: Self-pay | Admitting: Hematology

## 2016-08-18 ENCOUNTER — Ambulatory Visit: Payer: Federal, State, Local not specified - PPO

## 2016-08-18 ENCOUNTER — Other Ambulatory Visit (HOSPITAL_BASED_OUTPATIENT_CLINIC_OR_DEPARTMENT_OTHER): Payer: Federal, State, Local not specified - PPO

## 2016-08-18 ENCOUNTER — Telehealth: Payer: Self-pay | Admitting: *Deleted

## 2016-08-18 ENCOUNTER — Ambulatory Visit (HOSPITAL_BASED_OUTPATIENT_CLINIC_OR_DEPARTMENT_OTHER): Payer: Federal, State, Local not specified - PPO

## 2016-08-18 ENCOUNTER — Ambulatory Visit (HOSPITAL_BASED_OUTPATIENT_CLINIC_OR_DEPARTMENT_OTHER): Payer: Federal, State, Local not specified - PPO | Admitting: Hematology

## 2016-08-18 ENCOUNTER — Ambulatory Visit: Payer: Federal, State, Local not specified - PPO | Admitting: Nutrition

## 2016-08-18 VITALS — BP 119/78 | HR 91 | Temp 98.5°F | Resp 20 | Ht 66.0 in | Wt 137.2 lb

## 2016-08-18 DIAGNOSIS — Z5111 Encounter for antineoplastic chemotherapy: Secondary | ICD-10-CM

## 2016-08-18 DIAGNOSIS — C7801 Secondary malignant neoplasm of right lung: Secondary | ICD-10-CM

## 2016-08-18 DIAGNOSIS — C221 Intrahepatic bile duct carcinoma: Secondary | ICD-10-CM

## 2016-08-18 DIAGNOSIS — N390 Urinary tract infection, site not specified: Secondary | ICD-10-CM

## 2016-08-18 DIAGNOSIS — R630 Anorexia: Secondary | ICD-10-CM

## 2016-08-18 DIAGNOSIS — R109 Unspecified abdominal pain: Secondary | ICD-10-CM | POA: Diagnosis not present

## 2016-08-18 DIAGNOSIS — R110 Nausea: Secondary | ICD-10-CM

## 2016-08-18 DIAGNOSIS — B370 Candidal stomatitis: Secondary | ICD-10-CM

## 2016-08-18 LAB — URINALYSIS, MICROSCOPIC - CHCC
Bilirubin (Urine): NEGATIVE
Blood: NEGATIVE
Glucose: NEGATIVE mg/dL
Ketones: 5 mg/dL
Leukocyte Esterase: NEGATIVE
Nitrite: NEGATIVE
Protein: NEGATIVE mg/dL
RBC / HPF: NEGATIVE (ref 0–2)
Specific Gravity, Urine: 1.005 (ref 1.003–1.035)
Urobilinogen, UR: 0.2 mg/dL (ref 0.2–1)
pH: 6 (ref 4.6–8.0)

## 2016-08-18 LAB — CBC WITH DIFFERENTIAL/PLATELET
BASO%: 0.3 % (ref 0.0–2.0)
Basophils Absolute: 0 10*3/uL (ref 0.0–0.1)
EOS%: 0.3 % (ref 0.0–7.0)
Eosinophils Absolute: 0 10*3/uL (ref 0.0–0.5)
HCT: 35.7 % (ref 34.8–46.6)
HGB: 12.6 g/dL (ref 11.6–15.9)
LYMPH%: 58.5 % — ABNORMAL HIGH (ref 14.0–49.7)
MCH: 28.3 pg (ref 25.1–34.0)
MCHC: 35.3 g/dL (ref 31.5–36.0)
MCV: 80.2 fL (ref 79.5–101.0)
MONO#: 0.1 10*3/uL (ref 0.1–0.9)
MONO%: 3 % (ref 0.0–14.0)
NEUT#: 1.1 10*3/uL — ABNORMAL LOW (ref 1.5–6.5)
NEUT%: 37.9 % — ABNORMAL LOW (ref 38.4–76.8)
Platelets: 195 10*3/uL (ref 145–400)
RBC: 4.45 10*6/uL (ref 3.70–5.45)
RDW: 12.5 % (ref 11.2–14.5)
WBC: 3 10*3/uL — ABNORMAL LOW (ref 3.9–10.3)
lymph#: 1.8 10*3/uL (ref 0.9–3.3)

## 2016-08-18 LAB — COMPREHENSIVE METABOLIC PANEL
ALT: 29 U/L (ref 0–55)
AST: 23 U/L (ref 5–34)
Albumin: 4 g/dL (ref 3.5–5.0)
Alkaline Phosphatase: 46 U/L (ref 40–150)
Anion Gap: 10 mEq/L (ref 3–11)
BUN: 6.3 mg/dL — ABNORMAL LOW (ref 7.0–26.0)
CO2: 25 mEq/L (ref 22–29)
Calcium: 9.5 mg/dL (ref 8.4–10.4)
Chloride: 99 mEq/L (ref 98–109)
Creatinine: 0.7 mg/dL (ref 0.6–1.1)
EGFR: >90 mL/min/{1.73_m2} (ref >90)
Glucose: 100 mg/dl (ref 70–140)
Potassium: 3.8 mEq/L (ref 3.5–5.1)
Sodium: 134 mEq/L — ABNORMAL LOW (ref 136–145)
Total Bilirubin: 0.43 mg/dL (ref 0.20–1.20)
Total Protein: 7.4 g/dL (ref 6.4–8.3)

## 2016-08-18 MED ORDER — SODIUM CHLORIDE 0.9 % IV SOLN
Freq: Once | INTRAVENOUS | Status: AC
  Administered 2016-08-18: 12:00:00 via INTRAVENOUS

## 2016-08-18 MED ORDER — PALONOSETRON HCL INJECTION 0.25 MG/5ML
INTRAVENOUS | Status: AC
  Filled 2016-08-18: qty 5

## 2016-08-18 MED ORDER — DRONABINOL 2.5 MG PO CAPS: 2.5000 mg | ORAL_CAPSULE | Freq: Two times a day (BID) | ORAL | 1 refills | Status: DC

## 2016-08-18 MED ORDER — SODIUM CHLORIDE 0.9 % IV SOLN
1000.0000 mg/m2 | Freq: Once | INTRAVENOUS | Status: AC
  Administered 2016-08-18: 1748 mg via INTRAVENOUS
  Filled 2016-08-18: qty 45.97

## 2016-08-18 MED ORDER — FOSAPREPITANT DIMEGLUMINE INJECTION 150 MG
Freq: Once | INTRAVENOUS | Status: AC
  Administered 2016-08-18: 12:00:00 via INTRAVENOUS
  Filled 2016-08-18: qty 5

## 2016-08-18 MED ORDER — SODIUM CHLORIDE 0.9 % IV SOLN
25.0000 mg/m2 | Freq: Once | INTRAVENOUS | Status: AC
  Administered 2016-08-18: 43 mg via INTRAVENOUS
  Filled 2016-08-18: qty 43

## 2016-08-18 MED ORDER — NYSTATIN 100000 UNIT/ML MT SUSP: 5.0000 mL | Freq: Four times a day (QID) | OROMUCOSAL | 0 refills | Status: DC

## 2016-08-18 MED ORDER — PALONOSETRON HCL INJECTION 0.25 MG/5ML
0.2500 mg | Freq: Once | INTRAVENOUS | Status: AC
  Administered 2016-08-18: 0.25 mg via INTRAVENOUS

## 2016-08-18 MED ORDER — POTASSIUM CHLORIDE 2 MEQ/ML IV SOLN
Freq: Once | INTRAVENOUS | Status: AC
  Administered 2016-08-18: 10:00:00 via INTRAVENOUS
  Filled 2016-08-18: qty 10

## 2016-08-18 MED ORDER — HEPARIN SOD (PORK) LOCK FLUSH 100 UNIT/ML IV SOLN
500.0000 [IU] | Freq: Once | INTRAVENOUS | Status: AC | PRN
  Administered 2016-08-18: 500 [IU]
  Filled 2016-08-18: qty 5

## 2016-08-18 MED ORDER — SODIUM CHLORIDE 0.9% FLUSH
10.0000 mL | INTRAVENOUS | Status: DC | PRN
  Administered 2016-08-18: 10 mL
  Filled 2016-08-18: qty 10

## 2016-08-18 NOTE — Patient Instructions (Signed)
[facility] Discharge Instructions for Patients Receiving Chemotherapy  Today you received the following chemotherapy agents Gemzar and Cisplatin  To help prevent nausea and vomiting after your treatment, we encourage you to take your nausea medication as prescribed.   If you develop nausea and vomiting that is not controlled by your nausea medication, call the clinic.   BELOW ARE SYMPTOMS THAT SHOULD BE REPORTED IMMEDIATELY:  *FEVER GREATER THAN 100.5 F  *CHILLS WITH OR WITHOUT FEVER  NAUSEA AND VOMITING THAT IS NOT CONTROLLED WITH YOUR NAUSEA MEDICATION  *UNUSUAL SHORTNESS OF BREATH  *UNUSUAL BRUISING OR BLEEDING  TENDERNESS IN MOUTH AND THROAT WITH OR WITHOUT PRESENCE OF ULCERS  *URINARY PROBLEMS  *BOWEL PROBLEMS  UNUSUAL RASH Items with * indicate a potential emergency and should be followed up as soon as possible.  Feel free to call the clinic you have any questions or concerns. The clinic phone number is [phone].  Please show the [place] at check-in to the Emergency Department and triage nurse.  Cisplatin injection What is this medicine? CISPLATIN (SIS pla tin) is a chemotherapy drug. It targets fast dividing cells, like cancer cells, and causes these cells to die. This medicine is used to treat many types of cancer like bladder, ovarian, and testicular cancers. This medicine may be used for other purposes; ask your health care provider or pharmacist if you have questions. COMMON BRAND NAME(S): Platinol, Platinol -AQ What should I tell my health care provider before I take this medicine? They need to know if you have any of these conditions: -blood disorders -hearing problems -kidney disease -recent or ongoing radiation therapy -an unusual or allergic reaction to cisplatin, carboplatin, other chemotherapy, other medicines, foods, dyes, or preservatives -pregnant or trying to get pregnant -breast-feeding How should I use this  medicine? This drug is given as an infusion into a vein. It is administered in a hospital or clinic by a specially trained health care professional. Talk to your pediatrician regarding the use of this medicine in children. Special care may be needed. Overdosage: If you think you have taken too much of this medicine contact a poison control center or emergency room at once. NOTE: This medicine is only for you. Do not share this medicine with others. What if I miss a dose? It is important not to miss a dose. Call your doctor or health care professional if you are unable to keep an appointment. What may interact with this medicine? -dofetilide -foscarnet -medicines for seizures -medicines to increase blood counts like filgrastim, pegfilgrastim, sargramostim -probenecid -pyridoxine used with altretamine -rituximab -some antibiotics like amikacin, gentamicin, neomycin, polymyxin B, streptomycin, tobramycin -sulfinpyrazone -vaccines -zalcitabine Talk to your doctor or health care professional before taking any of these medicines: -acetaminophen -aspirin -ibuprofen -ketoprofen -naproxen This list may not describe all possible interactions. Give your health care provider a list of all the medicines, herbs, non-prescription drugs, or dietary supplements you use. Also tell them if you smoke, drink alcohol, or use illegal drugs. Some items may interact with your medicine. What should I watch for while using this medicine? Your condition will be monitored carefully while you are receiving this medicine. You will need important blood work done while you are taking this medicine. This drug may make you feel generally unwell. This is not uncommon, as chemotherapy can affect healthy cells as well as cancer cells. Report any side effects. Continue your course of treatment even though you feel ill unless your doctor tells you to  stop. In some cases, you may be given additional medicines to help with side  effects. Follow all directions for their use. Call your doctor or health care professional for advice if you get a fever, chills or sore throat, or other symptoms of a cold or flu. Do not treat yourself. This drug decreases your body's ability to fight infections. Try to avoid being around people who are sick. This medicine may increase your risk to bruise or bleed. Call your doctor or health care professional if you notice any unusual bleeding. Be careful brushing and flossing your teeth or using a toothpick because you may get an infection or bleed more easily. If you have any dental work done, tell your dentist you are receiving this medicine. Avoid taking products that contain aspirin, acetaminophen, ibuprofen, naproxen, or ketoprofen unless instructed by your doctor. These medicines may hide a fever. Do not become pregnant while taking this medicine. Women should inform their doctor if they wish to become pregnant or think they might be pregnant. There is a potential for serious side effects to an unborn child. Talk to your health care professional or pharmacist for more information. Do not breast-feed an infant while taking this medicine. Drink fluids as directed while you are taking this medicine. This will help protect your kidneys. Call your doctor or health care professional if you get diarrhea. Do not treat yourself. What side effects may I notice from receiving this medicine? Side effects that you should report to your doctor or health care professional as soon as possible: -allergic reactions like skin rash, itching or hives, swelling of the face, lips, or tongue -signs of infection - fever or chills, cough, sore throat, pain or difficulty passing urine -signs of decreased platelets or bleeding - bruising, pinpoint red spots on the skin, black, tarry stools, nosebleeds -signs of decreased red blood cells - unusually weak or tired, fainting spells, lightheadedness -breathing  problems -changes in hearing -gout pain -low blood counts - This drug may decrease the number of white blood cells, red blood cells and platelets. You may be at increased risk for infections and bleeding. -nausea and vomiting -pain, swelling, redness or irritation at the injection site -pain, tingling, numbness in the hands or feet -problems with balance, movement -trouble passing urine or change in the amount of urine Side effects that usually do not require medical attention (report to your doctor or health care professional if they continue or are bothersome): -changes in vision -loss of appetite -metallic taste in the mouth or changes in taste This list may not describe all possible side effects. Call your doctor for medical advice about side effects. You may report side effects to FDA at 1-800-FDA-1088. Where should I keep my medicine? This drug is given in a hospital or clinic and will not be stored at home. NOTE: This sheet is a summary. It may not cover all possible information. If you have questions about this medicine, talk to your doctor, pharmacist, or health care provider.  2017 Elsevier/Gold Standard (2007-11-21 14:40:54)  Gemcitabine injection What is this medicine? GEMCITABINE (jem SIT a been) is a chemotherapy drug. This medicine is used to treat many types of cancer like breast cancer, lung cancer, pancreatic cancer, and ovarian cancer. This medicine may be used for other purposes; ask your health care provider or pharmacist if you have questions. COMMON BRAND NAME(S): Gemzar What should I tell my health care provider before I take this medicine? They need to know if you have  any of these conditions: -blood disorders -infection -kidney disease -liver disease -recent or ongoing radiation therapy -an unusual or allergic reaction to gemcitabine, other chemotherapy, other medicines, foods, dyes, or preservatives -pregnant or trying to get pregnant -breast-feeding How  should I use this medicine? This drug is given as an infusion into a vein. It is administered in a hospital or clinic by a specially trained health care professional. Talk to your pediatrician regarding the use of this medicine in children. Special care may be needed. Overdosage: If you think you have taken too much of this medicine contact a poison control center or emergency room at once. NOTE: This medicine is only for you. Do not share this medicine with others. What if I miss a dose? It is important not to miss your dose. Call your doctor or health care professional if you are unable to keep an appointment. What may interact with this medicine? -medicines to increase blood counts like filgrastim, pegfilgrastim, sargramostim -some other chemotherapy drugs like cisplatin -vaccines Talk to your doctor or health care professional before taking any of these medicines: -acetaminophen -aspirin -ibuprofen -ketoprofen -naproxen This list may not describe all possible interactions. Give your health care provider a list of all the medicines, herbs, non-prescription drugs, or dietary supplements you use. Also tell them if you smoke, drink alcohol, or use illegal drugs. Some items may interact with your medicine. What should I watch for while using this medicine? Visit your doctor for checks on your progress. This drug may make you feel generally unwell. This is not uncommon, as chemotherapy can affect healthy cells as well as cancer cells. Report any side effects. Continue your course of treatment even though you feel ill unless your doctor tells you to stop. In some cases, you may be given additional medicines to help with side effects. Follow all directions for their use. Call your doctor or health care professional for advice if you get a fever, chills or sore throat, or other symptoms of a cold or flu. Do not treat yourself. This drug decreases your body's ability to fight infections. Try to avoid  being around people who are sick. This medicine may increase your risk to bruise or bleed. Call your doctor or health care professional if you notice any unusual bleeding. Be careful brushing and flossing your teeth or using a toothpick because you may get an infection or bleed more easily. If you have any dental work done, tell your dentist you are receiving this medicine. Avoid taking products that contain aspirin, acetaminophen, ibuprofen, naproxen, or ketoprofen unless instructed by your doctor. These medicines may hide a fever. Women should inform their doctor if they wish to become pregnant or think they might be pregnant. There is a potential for serious side effects to an unborn child. Talk to your health care professional or pharmacist for more information. Do not breast-feed an infant while taking this medicine. What side effects may I notice from receiving this medicine? Side effects that you should report to your doctor or health care professional as soon as possible: -allergic reactions like skin rash, itching or hives, swelling of the face, lips, or tongue -low blood counts - this medicine may decrease the number of white blood cells, red blood cells and platelets. You may be at increased risk for infections and bleeding. -signs of infection - fever or chills, cough, sore throat, pain or difficulty passing urine -signs of decreased platelets or bleeding - bruising, pinpoint red spots on the skin, black,  tarry stools, blood in the urine -signs of decreased red blood cells - unusually weak or tired, fainting spells, lightheadedness -breathing problems -chest pain -mouth sores -nausea and vomiting -pain, swelling, redness at site where injected -pain, tingling, numbness in the hands or feet -stomach pain -swelling of ankles, feet, hands -unusual bleeding Side effects that usually do not require medical attention (report to your doctor or health care professional if they continue or  are bothersome): -constipation -diarrhea -hair loss -loss of appetite -stomach upset This list may not describe all possible side effects. Call your doctor for medical advice about side effects. You may report side effects to FDA at 1-800-FDA-1088. Where should I keep my medicine? This drug is given in a hospital or clinic and will not be stored at home. NOTE: This sheet is a summary. It may not cover all possible information. If you have questions about this medicine, talk to your doctor, pharmacist, or health care provider.  2017 Elsevier/Gold Standard (2007-12-26 18:45:54)

## 2016-08-18 NOTE — Telephone Encounter (Signed)
Per LOS I have scheduled appts and gave the patient a calendar

## 2016-08-18 NOTE — Progress Notes (Signed)
1550cc Urine Output. Chemo ordered.

## 2016-08-18 NOTE — Progress Notes (Signed)
OK to treat with [place] and labs today per MD Burr Medico

## 2016-08-18 NOTE — Progress Notes (Signed)
Nutrition follow up completed with patient during infusion. She reports poor appetite and early satiety. She has occasional nausea. Oral nutrition supplements are too sweet and too thick. Weight loss continues with current weight documented as 137.2 pounds down from 142 pounds Dec 5.  Nutrition Diagnosis: Unintended weight loss continues.  Intervention:  Provided strategies for increased calories and protein. Educated patient on ways to decrease sweet taste of oral nutrition supplements. Recommended small frequent meals and snacks. Questions answered and teach back method used.  Monitoring, Evaluation, Goals: Patient will tolerate increased calories and protein for weigh gain or maintenance.  Next Visit: Wednesday, Jan 3, during infusion.  **Disclaimer: This note was dictated with voice recognition software. Similar sounding words can inadvertently be transcribed and this note may contain transcription errors which may not have been corrected upon publication of note.**

## 2016-08-18 NOTE — Telephone Encounter (Signed)
Message sent to chemo scheduler to be added per 08/18/16 los. Urine culture will be done in infusion, per Sharyn Lull. Appointments scheduled per 08/18/16 los. A copy of the appointment schedule and AVS report was given to the patient, per 08/18/16 los.

## 2016-08-18 NOTE — Progress Notes (Signed)
[place]  [phone] [phone]  Clinic Follow Up Note   Patient Care Team: [name], DO as PCP - General Jerene Bears, MD as Consulting Physician (Gastroenterology) 08/18/2016  CHIEF COMPLAINTS:  Follow up metastatic intrahepatic cholangiocarcinoma  Oncology History   Metastatic cholangiocarcinoma to lung [facility])   Staging form: Perihilar Bile Ducts, AJCC 7th Edition   - Clinical stage from 07/07/2016: Stage IVB (T2b, N1, M1) - Signed by Truitt Merle, MD on 07/28/2016      Metastatic cholangiocarcinoma to lung ([place])   05/22/2014 Procedure    Routine screening colonoscopy showed a sessile polyp measuring 4 mm in the sigmoid colon, removed, otherwise negative.      06/29/2016 Imaging    CT chest, abdomen and pelvis showed 2 indeterminant nodule in left and the right lung, 4-55m, indeterminate a hypovascular liver lesions largest in the caudate lobe measuring 2.6 cm, mild abdominal lymphadenopathy in the gastrohepatic ligament and portocaval space.      07/02/2016 Imaging    Abdominal MRI with and without contrast showed 2 lobular lesions in the caudate lobe, and is regular lymph nodes in the gastrohepatic ligament which is adjacent to the liver lesion, suspicious for malignancy. 2 small lesions in the right hepatic lobe are indeterminate, but concerning for metastasis.      07/07/2016 Procedure    EGD was negative, EUS biopsy of the liver and adjacent lymph node      07/07/2016 Initial Diagnosis    Metastatic [place]angi[place] lung [place]      07/07/2016 Initial Biopsy    Fine-needle aspiration of the liver lesion in caudate lobe and adjacent lymph nodes both showed metastatic adenocarcinoma.      08/11/2016 -  Chemotherapy    Cisplatin 25 mg/m, gemcitabine 1000 mg/m, on day one and 8, every 21 days         HISTORY OF PRESENTING ILLNESS:  Nancy [name] is here because of Her recently diagnosed  metastatic glandular carcinoma. She is accompanied by her husband and mother to my clinic today.  She has been having RUQ abdominal pain since mid 04/2016, she was seen by PCP and was treated for gas with GI cocktail and pepcide, which she did not help much. Her pain got worse, and radiates to right shoulder, she denies significant nausea, or bloating, BM normal, no fever, cough or dyspnea. She recently noticed mild chest discomfort in the low sternum area. She initially had the lab, ultrasound done by her primary care physician, which was unrevealing. She was referred to GI Dr. PHilarie Fredricksonand underwent EGD and CT scan, which showed a lobulated mass in the caudate lobe of liver, with at adjacent adenopathy. He underwent EUS and fine-needle biopsy of the liver mass and lymph nodes, all reviewed adenocarcinoma.  She has lost about 7lbs in the past 3 months. She has mild fatigue, but able to function at home. She has stopped working due to her abdominal pain and fatigue. She takes Norco every 1-3 times a day, and her pain seems not well controlled.  CURRENT THERAPY: Cisplatin and gemcitabine on Day 1, 8 every 21 days  INTERIM HISTORY: LDanalireturns for follow up and her second dose chemo. She developed low abdominal discomfort, and mild dysuria 2 days after chemotherapy last week, went to emergency room for evaluation, was prescribed Bactrim for mild UTI, but she has not taken and it. Her symptoms has improved, she denies into ureter, fever, chills. She  was very fatigued with low appetite and mild nausea for a few days after chemotherapy last week, so recovered and improved. Overall she still has low appetite, and she has lost 9 pounds in the past month.   MEDICAL HISTORY:  Past Medical History:  Diagnosis Date  . Allergy    SEASONAL  . Anemia    HIGH SCHOOL  . Atopic eczema   . Fibrocystic breast disease   . GERD (gastroesophageal reflux disease)   . Hx of migraines    seasonal   . Pneumonia    6  years ago   . TIA (transient ischemic attack)     SURGICAL HISTORY: Past Surgical History:  Procedure Laterality Date  . EUS N/A 07/07/2016   Procedure: UPPER ENDOSCOPIC ULTRASOUND (EUS) RADIAL;  Surgeon: [name], MD;  Location: [facility] ENDOSCOPY;  Service: Endoscopy;  Laterality: N/A;  . IR GENERIC HISTORICAL  08/03/2016   IR US GUIDE VASC ACCESS RIGHT 08/03/2016 Corrie Mckusick, DO [facility]-INTER[facility]  . IR GENERIC HISTORICAL  08/03/2016   IR FLUORO GUIDE PORT INSERTION RIGHT 08/03/2016 Corrie Mckusick, DO [facility]  . MYOMECTOMY  2002    SOCIAL HISTORY: Social History   Social History  . Marital status: Married    Spouse name: N/A  . Number of children: 0  . Years of education: 67   Occupational History  . [employer]  .  [employer]   Social History Main Topics  . Smoking status: Never Smoker  . Smokeless tobacco: Never Used  . Alcohol use Yes     Comment: maybe 3 times a year  . Drug use: No  . Sexual activity: Yes    Partners: Male   Other Topics Concern  . Not on file   Social History Narrative  . No narrative on file    FAMILY HISTORY: Family History  Problem Relation Age of Onset  . Gout Mother   . Diabetes Mother   . Hypertension Father   . Diabetes Father   . Breast cancer Paternal Aunt   . Cancer Maternal Grandfather     throat cancer   . Cancer Cousin 1    GI cancer   . Thyroid disease Neg Hx     ALLERGIES:  is allergic to penicillins and lipitor [atorvastatin].  MEDICATIONS:  Current Outpatient Prescriptions  Medication Sig Dispense Refill  . ALPRAZolam (XANAX) 0.5 MG tablet Take 1 tablet (0.5 mg total) by mouth as needed for anxiety. 10 tablet 0  . aspirin 81 MG tablet Take 81 mg by mouth daily.    Marland Kitchen CARAFATE 1 GM/10ML suspension TAKE 10 MLS (1 G TOTAL) BY MOUTH 4 (FOUR) TIMES DAILY - WITH MEALS AND AT BEDTIME. 420 mL 1  . cetirizine (ZYRTEC) 10 MG tablet Take 10 mg by mouth daily as needed. For allergies     . Cholecalciferol (VITAMIN  D3) 5000 UNITS CAPS Take 1 capsule by mouth daily.    . Ciclopirox 1 % shampoo Use as directed 2x a week for up to 4 weeks 120 mL 1  . COLOSTRUM PO Take 1.25 g by mouth 2 (two) times daily. 1.25 grams twice daily    . dicyclomine (BENTYL) 20 MG tablet 3 (three) times daily as needed.     Marland Kitchen HYDROcodone-acetaminophen (NORCO) 5-325 MG tablet Take 1-2 tablets by mouth every 6 (six) hours as needed for severe pain. 60 tablet 0  . lidocaine-prilocaine (EMLA) cream Apply 1 application topically as needed. Apply to  portacath 1 1/2 hours - 2 hours prior to procedures as needed. 30 g 1  . meclizine (ANTIVERT) 50 MG tablet Take 1 tablet (50 mg total) by mouth 3 (three) times daily as needed. 30 tablet 0  . Menaquinone-7 (VITAMIN K2) 40 MCG TABS Take by mouth.    . mometasone (ELOCON) 0.1 % lotion Apply topically daily. 60 mL 0  . NON FORMULARY 1 capsule 1 day or 1 dose. Pregnelone, pt reports she takes daily.    . Nutritional Supplements (DHEA) 15-50 MG CAPS Take 1 capsule by mouth 3 (three) times a week.    Marland Kitchen omeprazole (PRILOSEC) 40 MG capsule Take 1 capsule (40 mg total) by mouth 2 (two) times daily before a meal. 60 capsule 11  . ondansetron (ZOFRAN) 8 MG tablet Take 1 tablet (8 mg total) by mouth every 8 (eight) hours as needed for nausea or vomiting. 20 tablet 1  . Prenatal Vit-Fe Fumarate-FA (PRENATAL MULTIVITAMIN) TABS tablet Take 2 tablets by mouth daily at 12 noon.    . triamcinolone cream (KENALOG) 0.1 % Apply topically.    Marland Kitchen VITAMIN K PO Take 1 capsule by mouth daily.    Marland Kitchen dronabinol (MARINOL) 2.5 MG capsule Take 1 capsule (2.5 mg total) by mouth 2 (two) times daily before a meal. 30 capsule 1  . nystatin (MYCOSTATIN) 100000 UNIT/ML suspension Take 5 mLs (500,000 Units total) by mouth 4 (four) times daily. 60 mL 0  . sulfamethoxazole-trimethoprim (BACTRIM DS,SEPTRA DS) 800-160 MG tablet Take 1 tablet by mouth 2 (two) times daily.     No current facility-administered medications for this visit.       REVIEW OF SYSTEMS:   Constitutional: Denies fevers, chills or abnormal night sweats Eyes: Denies blurriness of vision, double vision or watery eyes Ears, nose, mouth, throat, and face: Denies mucositis or sore throat Respiratory: Denies cough, dyspnea or wheezes Cardiovascular: Denies palpitation, chest discomfort or lower extremity swelling Gastrointestinal:  Denies nausea, heartburn or change in bowel habits Skin: Denies abnormal skin rashes Lymphatics: Denies new lymphadenopathy or easy bruising Neurological:Denies numbness, tingling or new weaknesses Behavioral/Psych: Mood is stable, no new changes  All other systems were reviewed with the patient and are negative.  PHYSICAL EXAMINATION: ECOG PERFORMANCE STATUS: 1 - Symptomatic but completely ambulatory Blood pressure 142/84, heart rate 74, respiratory rate 20, temperature Nancy.5, pulse ox 99% on room air GENERAL:alert, no distress and comfortable SKIN: skin color, texture, turgor are normal, no rashes or significant lesions EYES: normal, conjunctiva are pink and non-injected, sclera clear OROPHARYNX:no exudate, no erythema and lips, buccal mucosa, (+) oral thrush on her tongue NECK: supple, thyroid normal size, non-tender, without nodularity LYMPH:  no palpable lymphadenopathy in the cervical, axillary or inguinal LUNGS: clear to auscultation and percussion with normal breathing effort HEART: regular rate & rhythm and no murmurs and no lower extremity edema ABDOMEN:abdomen soft, non-tender and normal bowel sounds Musculoskeletal:no cyanosis of digits and no clubbing  PSYCH: alert & oriented x 3 with fluent speech NEURO: no focal motor/sensory deficits  LABORATORY DATA:  I have reviewed the data as listed CBC Latest Ref Rng & Units 08/18/2016 08/14/2016 08/11/2016  WBC 3.9 - 10.3 10e3/uL 3.0(L) 4.7 3.6(L)  Hemoglobin 11.6 - 15.9 g/dL 12.6 12.3 12.8  Hematocrit 34.8 - 46.6 % 35.7 35.9(L) 38.5  Platelets 145 - 400 10e3/uL 195  232 216   CMP Latest Ref Rng & Units 08/18/2016 08/14/2016 08/11/2016  Glucose 70 - 140 mg/dl 100 89 79  BUN 7.0 -  26.0 mg/dL 6.3(L) 8 6.7(L)  Creatinine 0.6 - 1.1 mg/dL 0.7 0.67 0.7  Sodium 136 - 145 mEq/L 134(L) 137 136  Potassium 3.5 - 5.1 mEq/L 3.8 3.2(L) 3.6  Chloride 101 - 111 mmol/L - 102 -  CO2 22 - 29 mEq/L '25 24 23  '$ Calcium 8.4 - 10.4 mg/dL 9.5 8.9 9.2  Total Protein 6.4 - 8.3 g/dL 7.4 6.8 7.3  Total Bilirubin 0.20 - 1.20 mg/dL 0.43 0.8 0.36  Alkaline Phos 40 - 150 U/L 46 34(L) 49  AST 5 - 34 U/L '23 27 24  '$ ALT 0 - 55 U/L '29 25 20   '$ CA19.9 (0-35U/ml) 07/28/2016: 36  PATHOLOGY REPORT  Diagnosis 07/07/2016 FINE NEEDLE ASPIRATION, ENDOSCOPIC, LIVER (SPECIMEN 2 OF 2 COLLECTED 07/07/16): MALIGNANT CELLS CONSISTENT WITH ADENOCARCINOMA.  ADDITIONAL INFORMATION: Per request, immunohistochemistry was performed. The cells are positive for cytokeratin 7 and negative for cytokeratin 20 and CDX-2. This excludes a colorectal primary. Pancreaticobiliary and upper GI sources remain in the differential. Called to Dr. Burr Medico on 07/28/2016.  Diagnosis 07/07/2016 FINE NEEDLE ASPIRATION, ENDOSCOPIC, GASTRO-HEPATIC LYMPH NODE (SPECIMEN 1 OF 2 COLLECTED 07/07/16): MALIGNANT CELLS CONSISTENT WITH ADENOCARCINOMA, SEE COMMENT. Preliminary Diagnosis Intraoperative Diagnosis: Few atypical glandular clusters - recommend additional material. 4) Adequate ([facility])  RADIOGRAPHIC STUDIES: I have personally reviewed the radiological images as listed and agreed with the findings in the report. Ir US Guide Vasc Access Right  Result Date: 08/03/2016 INDICATION: 50 year old [name] with a history of cholangiocarcinoma EXAM: IMPLANTED PORT A CATH PLACEMENT WITH ULTRASOUND AND FLUOROSCOPIC GUIDANCE MEDICATIONS: 1.0 g Ancef; The antibiotic was administered within an appropriate time interval prior to skin puncture. ANESTHESIA/SEDATION: Moderate (conscious) sedation was employed during this procedure. A total of Versed  6.0 mg and Fentanyl 100 mcg was administered intravenously. Moderate Sedation Time: 28 minutes. The patient's level of consciousness and vital signs were monitored continuously by radiology nursing throughout the procedure under my direct supervision. FLUOROSCOPY TIME:  Zero minutes, 24 seconds (1 mGy) COMPLICATIONS: None PROCEDURE: The procedure, risks, benefits, and alternatives were explained to the patient. Questions regarding the procedure were encouraged and answered. The patient understands and consents to the procedure. Ultrasound survey was performed with images stored and sent to PACs. The right neck and chest was prepped with chlorhexidine, and draped in the usual sterile fashion using maximum barrier technique (cap and mask, sterile gown, sterile gloves, large sterile sheet, hand hygiene and cutaneous antiseptic). Antibiotic prophylaxis was provided with 2.0g Ancef administered IV one hour prior to skin incision. Local anesthesia was attained by infiltration with 1% lidocaine without epinephrine. Ultrasound demonstrated patency of the right internal jugular vein, and this was documented with an image. Under real-time ultrasound guidance, this vein was accessed with a 21 gauge micropuncture needle and image documentation was performed. A small dermatotomy was made at the access site with an 11 scalpel. A 0.018" wire was advanced into the SVC and used to estimate the length of the internal catheter. The access needle exchanged for a 22F micropuncture vascular sheath. The 0.018" wire was then removed and a 0.035" wire advanced into the IVC. An appropriate location for the subcutaneous reservoir was selected below the clavicle and an incision was made through the skin and underlying soft tissues. The subcutaneous tissues were then dissected using a combination of blunt and sharp surgical technique and a pocket was formed. A single lumen power injectable portacatheter was then tunneled through the subcutaneous  tissues from the pocket to the dermatotomy and the port reservoir placed  within the subcutaneous pocket. The venous access site was then serially dilated and a peel away vascular sheath placed over the wire. The wire was removed and the port catheter advanced into position under fluoroscopic guidance. The catheter tip is positioned in the cavoatrial junction. This was documented with a spot image. The portacatheter was then tested and found to flush and aspirate well. The port was flushed with saline followed by 100 units/mL heparinized saline. The pocket was then closed in two layers using first subdermal inverted interrupted absorbable sutures followed by a running subcuticular suture. The epidermis was then sealed with Dermabond. The dermatotomy at the venous access site was also seal with Dermabond. Patient tolerated the procedure well and remained hemodynamically stable throughout. No complications encountered and no significant blood loss encountered IMPRESSION: Status post right IJ port catheter placement. Catheter ready for use. Signed, Dulcy Fanny. Earleen Newport, DO [facility] Radiology Electronically Signed   By: Corrie Mckusick D.O.   On: 08/03/2016 15:56   Ir Fluoro Guide Port Insertion Right  Result Date: 08/03/2016 INDICATION: 50 year old [name] with a history of cholangiocarcinoma EXAM: IMPLANTED PORT A CATH PLACEMENT WITH ULTRASOUND AND FLUOROSCOPIC GUIDANCE MEDICATIONS: 1.0 g Ancef; The antibiotic was administered within an appropriate time interval prior to skin puncture. ANESTHESIA/SEDATION: Moderate (conscious) sedation was employed during this procedure. A total of Versed 6.0 mg and Fentanyl 100 mcg was administered intravenously. Moderate Sedation Time: 28 minutes. The patient's level of consciousness and vital signs were monitored continuously by radiology nursing throughout the procedure under my direct supervision. FLUOROSCOPY TIME:  Zero minutes, 24  seconds (1 mGy) COMPLICATIONS: None PROCEDURE: The procedure, risks, benefits, and alternatives were explained to the patient. Questions regarding the procedure were encouraged and answered. The patient understands and consents to the procedure. Ultrasound survey was performed with images stored and sent to PACs. The right neck and chest was prepped with chlorhexidine, and draped in the usual sterile fashion using maximum barrier technique (cap and mask, sterile gown, sterile gloves, large sterile sheet, hand hygiene and cutaneous antiseptic). Antibiotic prophylaxis was provided with 2.0g Ancef administered IV one hour prior to skin incision. Local anesthesia was attained by infiltration with 1% lidocaine without epinephrine. Ultrasound demonstrated patency of the right internal jugular vein, and this was documented with an image. Under real-time ultrasound guidance, this vein was accessed with a 21 gauge micropuncture needle and image documentation was performed. A small dermatotomy was made at the access site with an 11 scalpel. A 0.018" wire was advanced into the SVC and used to estimate the length of the internal catheter. The access needle exchanged for a 83F micropuncture vascular sheath. The 0.018" wire was then removed and a 0.035" wire advanced into the IVC. An appropriate location for the subcutaneous reservoir was selected below the clavicle and an incision was made through the skin and underlying soft tissues. The subcutaneous tissues were then dissected using a combination of blunt and sharp surgical technique and a pocket was formed. A single lumen power injectable portacatheter was then tunneled through the subcutaneous tissues from the pocket to the dermatotomy and the port reservoir placed within the subcutaneous pocket. The venous access site was then serially dilated and a peel away vascular sheath placed over the wire. The wire was removed and the port catheter advanced into position under  fluoroscopic guidance. The catheter tip is positioned in the cavoatrial junction. This was documented with a spot image. The portacatheter was then tested and found to  flush and aspirate well. The port was flushed with saline followed by 100 units/mL heparinized saline. The pocket was then closed in two layers using first subdermal inverted interrupted absorbable sutures followed by a running subcuticular suture. The epidermis was then sealed with Dermabond. The dermatotomy at the venous access site was also seal with Dermabond. Patient tolerated the procedure well and remained hemodynamically stable throughout. No complications encountered and no significant blood loss encountered IMPRESSION: Status post right IJ port catheter placement. Catheter ready for use. Signed, Dulcy Fanny. Earleen Newport, DO [facility] Radiology Electronically Signed   By: Corrie Mckusick D.O.   On: 08/03/2016 15:56   Upper EUS 07/07/2016 Dr. Ardis Hughs  - Endoscopically normal UGI tract (good views with standard gastroscope) - Vaguely bordered 4cm mass in the caudate lobe of liver that is confluent with what appear to be enlarged gastrohepatic lymphnodes (these may represent direct tumor extension however). Preliminary cytology from the lymphnodes shows malignancy, glandular. Preliminary cytology from the liver mass shows the same. Await final cytology results but this is most suspicious for peripheral intrahepatic cholangiocarcinoma with adjacent malignant adenopathy.   COLONOSCOPY 05/22/2014 ENDOSCOPIC IMPRESSION: 1. Sessile polyp measuring 4 mm in size was found in the sigmoid colon; polypectomy was performed with a cold snare 2. The colon mucosa was otherwise normal  ASSESSMENT & PLAN:  Nancy [name], without significant past medical history, presented with abdominal pain  1. Metastatic intrahepatic cholangiocarcinoma, with probable lung metastasis, [id number],  stage IV -I previously reviewed her CT, MRI, endoscopy findings and her biopsy results with patient and her family members in details. -We have reviewed her case in our GI tumor board this morning. Based on the scans and endoscopy findings, this is most consistent with cholangiocarcinoma. Her liver and inguinal biopsy showed adenocarcinoma, IHC was consistent with pancreatic-biliary primary -She has 2 additional small liver lesions and 2 4-24m lung lesions, which are indeterminate but that is suspicious for metastatic disease. -She was seen by surgeon Dr. BBarry Dieneswho thinks she is a not a candidate for surgical resection due to her metastatic disease. -We discussed her cancer is incurable at this stage, the goal of therapy is palliation, to prolong life and improve her quality life -I recommend her to start systemic chemotherapy, with first-line cisplatin and gemcitabine, on day 1, 8 every 21 days.  -She tolerated the first week chemotherapy moderately well, laboratory results reviewed, mild neutropenia, adequate for treatment, we'll proceed cycle 1 day 8 treatment today  2. Abdominal pain -continue hydrocodone/acetaminophen every 4-6 hours as needed for pain control  -Constipation and management reviewed with patient  3. UTI symptoms -I will repeat UA and urine culture today -UA was negative, urine culture from today is pending   4. Nausea and anorexia  -We reviewed her nausea management, she will continue Compazine and Zofran -I encouraged her to take a nutritional supplement, she'll follow up with our dietitian today -We discussed appetite stimulant, she is agreeable to try Marinol. We'll start her on 2.5 mg twice daily.  5. Oral thrush  -I called in nystatin mouthwash for her today  Plan -first cycle day 8 cisplatin and gemcitabine today  -Lab, flushed, follow-up and chemotherapy in 2 week -I called in nystatin mouthwash, and Marinol for her today  Orders Placed This Encounter    Procedures  . Urine Culture    Standing Status:   Future    Number of Occurrences:   1    Standing Expiration Date:  08/18/2017  . Urinalysis with microscopic    Standing Status:   Future    Number of Occurrences:   1    Standing Expiration Date:   08/18/2017    All questions were answered. The patient knows to call the clinic with any problems, questions or concerns.  I spent 20 minutes counseling the patient face to face. The total time spent in the appointment was 25 minutes and more than 50% was on counseling.     Truitt Merle, MD 08/18/2016

## 2016-08-19 LAB — CANCER ANTIGEN 19-9: CA 19-9: 51 U/mL — ABNORMAL HIGH (ref 0–35)

## 2016-08-19 LAB — URINE CULTURE

## 2016-08-20 ENCOUNTER — Other Ambulatory Visit: Payer: Self-pay | Admitting: Medical Oncology

## 2016-08-20 ENCOUNTER — Other Ambulatory Visit: Payer: Self-pay | Admitting: Hematology

## 2016-08-21 ENCOUNTER — Ambulatory Visit (HOSPITAL_BASED_OUTPATIENT_CLINIC_OR_DEPARTMENT_OTHER): Payer: Federal, State, Local not specified - PPO

## 2016-08-21 VITALS — BP 123/80 | HR 79 | Temp 98.4°F | Resp 16

## 2016-08-21 DIAGNOSIS — C7801 Secondary malignant neoplasm of right lung: Secondary | ICD-10-CM

## 2016-08-21 DIAGNOSIS — C221 Intrahepatic bile duct carcinoma: Secondary | ICD-10-CM | POA: Diagnosis not present

## 2016-08-21 MED ORDER — HEPARIN SOD (PORK) LOCK FLUSH 100 UNIT/ML IV SOLN
500.0000 [IU] | Freq: Once | INTRAVENOUS | Status: AC | PRN
  Administered 2016-08-21: 500 [IU]
  Filled 2016-08-21: qty 5

## 2016-08-21 MED ORDER — SODIUM CHLORIDE 0.9% FLUSH
10.0000 mL | INTRAVENOUS | Status: DC | PRN
  Administered 2016-08-21: 10 mL
  Filled 2016-08-21: qty 10

## 2016-08-21 MED ORDER — SODIUM CHLORIDE 0.9 % IV SOLN
Freq: Once | INTRAVENOUS | Status: AC
  Administered 2016-08-21: 09:00:00 via INTRAVENOUS

## 2016-08-21 NOTE — Patient Instructions (Signed)

## 2016-08-26 ENCOUNTER — Other Ambulatory Visit: Payer: Self-pay | Admitting: *Deleted

## 2016-08-26 ENCOUNTER — Telehealth: Payer: Self-pay | Admitting: *Deleted

## 2016-08-26 DIAGNOSIS — C7800 Secondary malignant neoplasm of unspecified lung: Principal | ICD-10-CM

## 2016-08-26 DIAGNOSIS — C221 Intrahepatic bile duct carcinoma: Secondary | ICD-10-CM

## 2016-08-26 MED ORDER — HYDROCODONE-ACETAMINOPHEN 5-325 MG PO TABS: 1.0000 | ORAL_TABLET | Freq: Four times a day (QID) | ORAL | 0 refills | Status: DC | PRN

## 2016-08-26 NOTE — Telephone Encounter (Signed)
Pt called requesting refill of Hydrocodone.  Spoke with pt and was informed that Dr. Hilarie Fredrickson had given script for pain med.  Since pt is under active chemo, pt would like to know if Dr. Burr Medico would refill pain meds.  Stated pain in abdominal - pt takes 3 tabs daily for pain.  Stated bowel functions fine - knows to take stool softener to prevent constipation.  Informed pt that Dr. Burr Medico will be back in office next week.  Jenny Reichmann, NP notified. Informed pt that script will be ready for pick up on Friday morning.  Pt voiced understanding. Pt's   Phone     [phone].

## 2016-08-26 NOTE — Progress Notes (Signed)
[facility]  [phone] [phone]  Clinic Follow Up Note   Patient Care Team: Nancy [name] as PCP - General Nancy [name] as Consulting Physician (Gastroenterology) 09/01/2016  CHIEF COMPLAINTS:  Follow up metastatic intrahepatic cholangiocarcinoma  Oncology History   Metastatic cholangiocarcinoma to lung [facility])   Staging form: Perihilar Bile Ducts, AJCC 7th Edition   - Clinical stage from 07/07/2016: Stage IVB (T2b, N1, M1) - Signed by Nancy Merle, [name] on 07/28/2016      Metastatic cholangiocarcinoma to lung (Nancy [name])   05/22/2014 Procedure    Routine screening colonoscopy showed a sessile polyp measuring 4 mm in the sigmoid colon, removed, otherwise negative.      06/29/2016 Imaging    CT chest, abdomen and pelvis showed 2 indeterminant nodule in left and the right lung, 4-63m, indeterminate a hypovascular liver lesions largest in the caudate lobe measuring 2.6 cm, mild abdominal lymphadenopathy in the gastrohepatic ligament and portocaval space.      07/02/2016 Imaging    Abdominal MRI with and without contrast showed 2 lobular lesions in the caudate lobe, and is regular lymph nodes in the gastrohepatic ligament which is adjacent to the liver lesion, suspicious for malignancy. 2 small lesions in the right hepatic lobe are indeterminate, but concerning for metastasis.      07/07/2016 Procedure    EGD was negative, EUS biopsy of the liver and adjacent lymph node      07/07/2016 Initial Diagnosis    Metastatic cholangiocarcinoma to lung (Nancy [name]      07/07/2016 Initial Biopsy    Fine-needle aspiration of the liver lesion in caudate lobe and adjacent lymph nodes both showed metastatic adenocarcinoma.      08/11/2016 -  Chemotherapy    Cisplatin 25 mg/m, gemcitabine 1000 mg/m, on day one and 8, every 21 days         HISTORY OF PRESENTING ILLNESS:  Nancy Berkshire50y.o. female is here because of Her recently diagnosed  metastatic glandular carcinoma. She is accompanied by her husband and mother to my clinic today.  She has been having RUQ abdominal pain since mid 04/2016, she was seen by PCP and was treated for gas with GI cocktail and pepcide, which she did not help much. Her pain got worse, and radiates to right shoulder, she denies significant nausea, or bloating, BM normal, no fever, cough or dyspnea. She recently noticed mild chest discomfort in the low sternum area. She initially had the lab, ultrasound done by her primary care physician, which was unrevealing. She was referred to GI Dr. PHilarie Fredricksonand underwent EGD and CT scan, which showed a lobulated mass in the caudate lobe of liver, with at adjacent adenopathy. He underwent EUS and fine-needle biopsy of the liver mass and lymph nodes, all reviewed adenocarcinoma.  She has lost about 7lbs in the past 3 months. She has mild fatigue, but able to function at home. She has stopped working due to her abdominal pain and fatigue. She takes Norco every 1-3 times a day, and her pain seems not well controlled.  CURRENT THERAPY: Cisplatin and gemcitabine on Day 1, 8 every 21 days  INTERIM HISTORY: LAngelenereturns for follow up and her second cycle chemo. She ate very well over the holidays and notes 3 lbs weight gain. She does still experience abdominal pain with eating certain foods. She continue to try different foods to see which [name] and [name] not affect her stomach. She denies nausea. In the  last two weeks, there were days when she didn't have a bowel movement so she would take the Senokot. She has been trying to drink more fluids. She is having less discomfort wearing a bra. The upper abdominal pain is not as severe since beginning treatment. The mid abdominal pain feels similar to period cramps and this is the pain which necessitates norco to manage it. Last week she took norco three times daily, once in the morning, one mid-day, and one at night. In the last three days, she  has taken two norco daily. She is not currently taking carafate because she isn't having symptoms. She is taking vitamins daily, including Vitamin K and D.   MEDICAL HISTORY:  Past Medical History:  Diagnosis Date  . Allergy    SEASONAL  . Anemia    HIGH SCHOOL  . Atopic eczema   . Fibrocystic breast disease   . GERD (gastroesophageal reflux disease)   . Hx of migraines    seasonal   . Pneumonia    6 years ago   . TIA (transient ischemic attack)     SURGICAL HISTORY: Past Surgical History:  Procedure Laterality Date  . EUS N/A 07/07/2016   Procedure: UPPER ENDOSCOPIC ULTRASOUND (EUS) RADIAL;  Surgeon: Nancy Banister, [name];  Location: [facility];  Service: Endoscopy;  Laterality: N/A;  . IR GENERIC HISTORICAL  08/03/2016   IR US GUIDE VASC ACCESS RIGHT 08/03/2016 Nancy Mckusick, [name] [facility]  . IR GENERIC HISTORICAL  08/03/2016   IR FLUORO GUIDE PORT INSERTION RIGHT 08/03/2016 Nancy Mckusick, [name] [facility]  . MYOMECTOMY  2002    SOCIAL HISTORY: Social History   Social History  . Marital status: Married    Spouse name: N/A  . Number of children: 0  . Years of education: 31   Occupational History  . [employer]  .  [employer]   Social History Main Topics  . Smoking status: Never Smoker  . Smokeless tobacco: Never Used  . Alcohol use Yes     Comment: maybe 3 times a year  . Drug use: No  . Sexual activity: Yes    Partners: Male   Other Topics Concern  . Not on file   Social History Narrative  . No narrative on file    FAMILY HISTORY: Family History  Problem Relation Age of Onset  . Gout Mother   . Diabetes Mother   . Hypertension Father   . Diabetes Father   . Breast cancer Paternal Aunt   . Cancer Maternal Grandfather     throat cancer   . Cancer Cousin 92    GI cancer   . Thyroid disease Neg Hx     ALLERGIES:  is allergic to penicillins and lipitor [atorvastatin].  MEDICATIONS:  Current Outpatient Prescriptions  Medication Sig Dispense  Refill  . ALPRAZolam (XANAX) 0.5 MG tablet Take 1 tablet (0.5 mg total) by mouth as needed for anxiety. 10 tablet 0  . aspirin 81 MG tablet Take 81 mg by mouth daily.    . cetirizine (ZYRTEC) 10 MG tablet Take 10 mg by mouth daily as needed. For allergies     . Cholecalciferol (VITAMIN D3) 5000 UNITS CAPS Take 1 capsule by mouth daily.    . Ciclopirox 1 % shampoo Use as directed 2x a week for up to 4 weeks 120 mL 1  . COLOSTRUM PO Take 1.25 g by mouth 2 (two) times daily. 1.25 grams twice daily    .  dicyclomine (BENTYL) 20 MG tablet 3 (three) times daily as needed.     . dronabinol (MARINOL) 2.5 MG capsule Take 1 capsule (2.5 mg total) by mouth 2 (two) times daily before a meal. 30 capsule 1  . HYDROcodone-acetaminophen (NORCO) 5-325 MG tablet Take 1-2 tablets by mouth every 6 (six) hours as needed for severe pain. 60 tablet 0  . lidocaine-prilocaine (EMLA) cream Apply 1 application topically as needed. Apply to portacath 1 1/2 hours - 2 hours prior to procedures as needed. 30 g 1  . meclizine (ANTIVERT) 50 MG tablet Take 1 tablet (50 mg total) by mouth 3 (three) times daily as needed. 30 tablet 0  . Menaquinone-7 (VITAMIN K2) 40 MCG TABS Take by mouth.    . mometasone (ELOCON) 0.1 % lotion Apply topically daily. 60 mL 0  . NON FORMULARY 1 capsule 1 day or 1 dose. Pregnelone, pt reports she takes daily.    . Nutritional Supplements (DHEA) 15-50 MG CAPS Take 1 capsule by mouth 3 (three) times a week.    . nystatin (MYCOSTATIN) 100000 UNIT/ML suspension Take 5 mLs (500,000 Units total) by mouth 4 (four) times daily. 60 mL 0  . omeprazole (PRILOSEC) 40 MG capsule Take 1 capsule (40 mg total) by mouth 2 (two) times daily before a meal. 60 capsule 11  . ondansetron (ZOFRAN) 8 MG tablet Take 1 tablet (8 mg total) by mouth every 8 (eight) hours as needed for nausea or vomiting. 20 tablet 1  . Prenatal Vit-Fe Fumarate-FA (PRENATAL MULTIVITAMIN) TABS tablet Take 2 tablets by mouth daily at 12 noon.    .  sulfamethoxazole-trimethoprim (BACTRIM DS,SEPTRA DS) 800-160 MG tablet Take 1 tablet by mouth 2 (two) times daily.    Marland Kitchen triamcinolone cream (KENALOG) 0.1 % Apply topically.    Marland Kitchen VITAMIN K PO Take 1 capsule by mouth daily.    Marland Kitchen CARAFATE 1 GM/10ML suspension TAKE 10 MLS (1 G TOTAL) BY MOUTH 4 (FOUR) TIMES DAILY - WITH MEALS AND AT BEDTIME. (Patient not taking: Reported on 09/01/2016) 420 mL 1   No current facility-administered medications for this visit.     REVIEW OF SYSTEMS:   Constitutional: Denies fevers, chills or abnormal night sweats Eyes: Denies blurriness of vision, double vision or watery eyes Ears, nose, mouth, throat, and face: Denies mucositis or sore throat Respiratory: Denies cough, dyspnea or wheezes Cardiovascular: Denies palpitation, chest discomfort or lower extremity swelling Gastrointestinal:  Denies nausea, heartburn or change in bowel habits (+) upper abdominal pain improving, mid abdominal pain similar to menstrual cramping Skin: Denies abnormal skin rashes Lymphatics: Denies new lymphadenopathy or easy bruising Neurological:Denies numbness, tingling or new weaknesses Behavioral/Psych: Mood is stable, no new changes  All other systems were reviewed with the patient and are negative.  PHYSICAL EXAMINATION: ECOG PERFORMANCE STATUS: 1 - Symptomatic but completely ambulatory BP (!) 106/54 (BP Location: Left Arm, Patient Position: Sitting) Comment:  Made nurse aware of pt BP  Pulse 72   Temp 98.3 F (36.8 C) (Oral)   Resp 18   Ht '5\' 6"'$  (1.676 m)   Wt 140 lb 4.8 oz (63.6 kg)   LMP 05/18/2016 (Approximate)   SpO2 100%   BMI 22.65 kg/m  GENERAL:alert, no distress and comfortable SKIN: skin color, texture, turgor are normal, no rashes or significant lesions EYES: normal, conjunctiva are pink and non-injected, sclera clear OROPHARYNX:no exudate, no erythema and lips, buccal mucosa, (+) oral thrush on her tongue NECK: supple, thyroid normal size, non-tender, without  nodularity LYMPH:  no palpable lymphadenopathy in the cervical, axillary or inguinal LUNGS: clear to auscultation and percussion with normal breathing effort HEART: regular rate & rhythm and no murmurs and no lower extremity edema ABDOMEN:abdomen soft, non-tender and normal bowel sounds Musculoskeletal:no cyanosis of digits and no clubbing  PSYCH: alert & oriented x 3 with fluent speech NEURO: no focal motor/sensory deficits  LABORATORY DATA:  I have reviewed the data as listed CBC Latest Ref Rng & Units 09/01/2016 08/18/2016 08/14/2016  WBC 3.9 - 10.3 10e3/uL 2.2(L) 3.0(L) 4.7  Hemoglobin 11.6 - 15.9 g/dL 10.8(L) 12.6 12.3  Hematocrit 34.8 - 46.6 % 31.8(L) 35.7 35.9(L)  Platelets 145 - 400 10e3/uL 231 195 232   CMP Latest Ref Rng & Units 09/01/2016 08/18/2016 08/14/2016  Glucose 70 - 140 mg/dl 88 100 89  BUN 7.0 - 26.0 mg/dL <4.0(L) 6.3(L) 8  Creatinine 0.6 - 1.1 mg/dL 0.7 0.7 0.67  Sodium 136 - 145 mEq/L 138 134(L) 137  Potassium 3.5 - 5.1 mEq/L 4.1 3.8 3.2(L)  Chloride 101 - 111 mmol/L - - 102  CO2 22 - 29 mEq/L '25 25 24  '$ Calcium 8.4 - 10.4 mg/dL 8.9 9.5 8.9  Total Protein 6.4 - 8.3 g/dL 6.4 7.4 6.8  Total Bilirubin 0.20 - 1.20 mg/dL 0.24 0.43 0.8  Alkaline Phos 40 - 150 U/L 39(L) 46 34(L)  AST 5 - 34 U/L '23 23 27  '$ ALT 0 - 55 U/L '28 29 25   '$ ANC 0.7K TODAY   CA19.9 (0-35U/ml) 07/28/2016: 36 08/18/2016: 51  PATHOLOGY REPORT  Diagnosis 07/07/2016 FINE NEEDLE ASPIRATION, ENDOSCOPIC, LIVER (SPECIMEN 2 OF 2 COLLECTED 07/07/16): MALIGNANT CELLS CONSISTENT WITH ADENOCARCINOMA.  ADDITIONAL INFORMATION: Per request, immunohistochemistry was performed. The cells are positive for cytokeratin 7 and negative for cytokeratin 20 and CDX-2. This excludes a colorectal primary. Pancreaticobiliary and upper GI sources remain in the differential. Called to Dr. Burr Medico on 07/28/2016.  Diagnosis 07/07/2016 FINE NEEDLE ASPIRATION, ENDOSCOPIC, GASTRO-HEPATIC LYMPH NODE (SPECIMEN 1 OF 2 COLLECTED  07/07/16): MALIGNANT CELLS CONSISTENT WITH ADENOCARCINOMA, SEE COMMENT. Preliminary Diagnosis Intraoperative Diagnosis: Few atypical glandular clusters - recommend additional material. 4) Adequate ([facility])  RADIOGRAPHIC STUDIES: I have personally reviewed the radiological images as listed and agreed with the findings in the report. Ir US Guide Vasc Access Right  Result Date: 08/03/2016 INDICATION: 50 year old female with a history of cholangiocarcinoma EXAM: IMPLANTED PORT A CATH PLACEMENT WITH ULTRASOUND AND FLUOROSCOPIC GUIDANCE MEDICATIONS: 1.0 g Ancef; The antibiotic was administered within an appropriate time interval prior to skin puncture. ANESTHESIA/SEDATION: Moderate (conscious) sedation was employed during this procedure. A total of Versed 6.0 mg and Fentanyl 100 mcg was administered intravenously. Moderate Sedation Time: 28 minutes. The patient's level of consciousness and vital signs were monitored continuously by radiology nursing throughout the procedure under my direct supervision. FLUOROSCOPY TIME:  Zero minutes, 24 seconds (1 mGy) COMPLICATIONS: None PROCEDURE: The procedure, risks, benefits, and alternatives were explained to the patient. Questions regarding the procedure were encouraged and answered. The patient understands and consents to the procedure. Ultrasound survey was performed with images stored and sent to PACs. The right neck and chest was prepped with chlorhexidine, and draped in the usual sterile fashion using maximum barrier technique (cap and mask, sterile gown, sterile gloves, large sterile sheet, hand hygiene and cutaneous antiseptic). Antibiotic prophylaxis was provided with 2.0g Ancef administered IV one hour prior to skin incision. Local anesthesia was attained by infiltration with 1% lidocaine without epinephrine. Ultrasound demonstrated patency of the right internal jugular vein, and this  was documented with an image. Under real-time ultrasound guidance, this vein was  accessed with a 21 gauge micropuncture needle and image documentation was performed. A small dermatotomy was made at the access site with an 11 scalpel. A 0.018" wire was advanced into the SVC and used to estimate the length of the internal catheter. The access needle exchanged for a 5F micropuncture vascular sheath. The 0.018" wire was then removed and a 0.035" wire advanced into the IVC. An appropriate location for the subcutaneous reservoir was selected below the clavicle and an incision was made through the skin and underlying soft tissues. The subcutaneous tissues were then dissected using a combination of blunt and sharp surgical technique and a pocket was formed. A single lumen power injectable portacatheter was then tunneled through the subcutaneous tissues from the pocket to the dermatotomy and the port reservoir placed within the subcutaneous pocket. The venous access site was then serially dilated and a peel away vascular sheath placed over the wire. The wire was removed and the port catheter advanced into position under fluoroscopic guidance. The catheter tip is positioned in the cavoatrial junction. This was documented with a spot image. The portacatheter was then tested and found to flush and aspirate well. The port was flushed with saline followed by 100 units/mL heparinized saline. The pocket was then closed in two layers using first subdermal inverted interrupted absorbable sutures followed by a running subcuticular suture. The epidermis was then sealed with Dermabond. The dermatotomy at the venous access site was also seal with Dermabond. Patient tolerated the procedure well and remained hemodynamically stable throughout. No complications encountered and no significant blood loss encountered IMPRESSION: Status post right IJ port catheter placement. Catheter ready for use. Signed, Dulcy Fanny. Earleen Newport, [name] Radiology Electronically Signed   By:  Nancy [name] D.O.   On: 08/03/2016 15:56   Ir Fluoro Guide Port Insertion Right  Result Date: 08/03/2016 INDICATION: 50 year old female with a history of cholangiocarcinoma EXAM: IMPLANTED PORT A CATH PLACEMENT WITH ULTRASOUND AND FLUOROSCOPIC GUIDANCE MEDICATIONS: 1.0 g Ancef; The antibiotic was administered within an appropriate time interval prior to skin puncture. ANESTHESIA/SEDATION: Moderate (conscious) sedation was employed during this procedure. A total of Versed 6.0 mg and Fentanyl 100 mcg was administered intravenously. Moderate Sedation Time: 28 minutes. The patient's level of consciousness and vital signs were monitored continuously by radiology nursing throughout the procedure under my direct supervision. FLUOROSCOPY TIME:  Zero minutes, 24 seconds (1 mGy) COMPLICATIONS: None PROCEDURE: The procedure, risks, benefits, and alternatives were explained to the patient. Questions regarding the procedure were encouraged and answered. The patient understands and consents to the procedure. Ultrasound survey was performed with images stored and sent to PACs. The right neck and chest was prepped with chlorhexidine, and draped in the usual sterile fashion using maximum barrier technique (cap and mask, sterile gown, sterile gloves, large sterile sheet, hand hygiene and cutaneous antiseptic). Antibiotic prophylaxis was provided with 2.0g Ancef administered IV one hour prior to skin incision. Local anesthesia was attained by infiltration with 1% lidocaine without epinephrine. Ultrasound demonstrated patency of the right internal jugular vein, and this was documented with an image. Under real-time ultrasound guidance, this vein was accessed with a 21 gauge micropuncture needle and image documentation was performed. A small dermatotomy was made at the access site with an 11 scalpel. A 0.018" wire was advanced into the SVC and used to estimate the length of the internal catheter. The access needle exchanged for  a  63F micropuncture vascular sheath. The 0.018" wire was then removed and a 0.035" wire advanced into the IVC. An appropriate location for the subcutaneous reservoir was selected below the clavicle and an incision was made through the skin and underlying soft tissues. The subcutaneous tissues were then dissected using a combination of blunt and sharp surgical technique and a pocket was formed. A single lumen power injectable portacatheter was then tunneled through the subcutaneous tissues from the pocket to the dermatotomy and the port reservoir placed within the subcutaneous pocket. The venous access site was then serially dilated and a peel away vascular sheath placed over the wire. The wire was removed and the port catheter advanced into position under fluoroscopic guidance. The catheter tip is positioned in the cavoatrial junction. This was documented with a spot image. The portacatheter was then tested and found to flush and aspirate well. The port was flushed with saline followed by 100 units/mL heparinized saline. The pocket was then closed in two layers using first subdermal inverted interrupted absorbable sutures followed by a running subcuticular suture. The epidermis was then sealed with Dermabond. The dermatotomy at the venous access site was also seal with Dermabond. Patient tolerated the procedure well and remained hemodynamically stable throughout. No complications encountered and no significant blood loss encountered IMPRESSION: Status post right IJ port catheter placement. Catheter ready for use. Signed, Dulcy Fanny. Earleen Newport, [name] Radiology Electronically Signed   By: Nancy [name] D.O.   On: 08/03/2016 15:56   Upper EUS 07/07/2016 Dr. Ardis Hughs  - Endoscopically normal UGI tract (good views with standard gastroscope) - Vaguely bordered 4cm mass in the caudate lobe of liver that is confluent with what appear to be enlarged gastrohepatic lymphnodes  (these may represent direct tumor extension however). Preliminary cytology from the lymphnodes shows malignancy, glandular. Preliminary cytology from the liver mass shows the same. Await final cytology results but this is most suspicious for peripheral intrahepatic cholangiocarcinoma with adjacent malignant adenopathy.   COLONOSCOPY 05/22/2014 ENDOSCOPIC IMPRESSION: 1. Sessile polyp measuring 4 mm in size was found in the sigmoid colon; polypectomy was performed with a cold snare 2. The colon mucosa was otherwise normal  ASSESSMENT & PLAN:  50 y.o. African-American female, without significant past medical history, presented with abdominal pain  1. Metastatic intrahepatic cholangiocarcinoma, with probable lung metastasis, [id number], stage IV -I previously reviewed her CT, MRI, endoscopy findings and her biopsy results with patient and her family members in details. -We have previously reviewed her case in our GI tumor board. Based on the scans and endoscopy findings, this is most consistent with cholangiocarcinoma. Her liver and inguinal biopsy showed adenocarcinoma, IHC was consistent with pancreatic-biliary primary -She has 2 additional small liver lesions and 2 4-72m lung lesions, which are indeterminate but that is suspicious for metastatic disease. -She was seen by surgeon Dr. BBarry Dieneswho thinks she is a not a candidate for surgical resection due to her metastatic disease. -We previously discussed her cancer is incurable at this stage, the goal of therapy is palliation, to prolong life and improve her quality life -she is on first-line cisplatin and gemcitabine, on day 1, 8 every 21 days.  -She tolerated first cycle chemotherapy moderately well, laboratory results reviewed, neutrophil count is 0.7 today, not adequate for treatment, we'll hold cycle 2 day 1 treatment today, and postpone to 09/07/2015  2. Abdominal pain -continue hydrocodone/acetaminophen every 4-6 hours as needed for pain  control  -Constipation and management reviewed with  patient  3. Nausea and anorexia (improved) -We reviewed her nausea management, she will continue Compazine and Zofran -I encouraged her to take a nutritional supplement -We discussed appetite stimulant, she is on Marinol, appetite improved  -She is scheduled with nutrition on 09/01/2016 -The patient ate well over the holidays with 3 lbs weight gain   Plan -second cycle day 1 cisplatin and gemcitabine will be [name] today based on today's blood counts. She will be rescheduled for treatment to 09/07/2015, D8 on 1/15 -Lab, flushed, follow-up and chemotherapy on 1/29 -She does not need any refills at this time -IVF 3 days after each chemo treatment  -will add Neupogen after cycle 2 day 8 chemotherapy treatment, if her insurance approves it.  No orders of the defined types were placed in this encounter.   All questions were answered. The patient knows to call the clinic with any problems, questions or concerns.  I spent 20 minutes counseling the patient face to face. The total time spent in the appointment was 25 minutes and more than 50% was on counseling.   This document serves as a record of services personally performed by Nancy Merle, [name]. It was created on her behalf by Arlyce Harman, a trained medical scribe. The creation of this record is based on the scribe's personal observations and the provider's statements to them. This document has been checked and approved by the attending provider.   Nancy Merle, [name] 09/01/2016

## 2016-08-31 ENCOUNTER — Ambulatory Visit: Payer: Federal, State, Local not specified - PPO | Admitting: Neurology

## 2016-09-01 ENCOUNTER — Ambulatory Visit: Payer: Federal, State, Local not specified - PPO

## 2016-09-01 ENCOUNTER — Telehealth: Payer: Self-pay | Admitting: Hematology

## 2016-09-01 ENCOUNTER — Ambulatory Visit (HOSPITAL_BASED_OUTPATIENT_CLINIC_OR_DEPARTMENT_OTHER): Payer: Federal, State, Local not specified - PPO | Admitting: Hematology

## 2016-09-01 ENCOUNTER — Telehealth: Payer: Self-pay | Admitting: *Deleted

## 2016-09-01 ENCOUNTER — Encounter: Payer: Self-pay | Admitting: Hematology

## 2016-09-01 ENCOUNTER — Other Ambulatory Visit: Payer: Self-pay | Admitting: *Deleted

## 2016-09-01 ENCOUNTER — Encounter: Payer: Federal, State, Local not specified - PPO | Admitting: Nutrition

## 2016-09-01 ENCOUNTER — Other Ambulatory Visit (HOSPITAL_BASED_OUTPATIENT_CLINIC_OR_DEPARTMENT_OTHER): Payer: Federal, State, Local not specified - PPO

## 2016-09-01 VITALS — BP 106/54 | HR 72 | Temp 98.3°F | Resp 18 | Ht 66.0 in | Wt 140.3 lb

## 2016-09-01 DIAGNOSIS — R630 Anorexia: Secondary | ICD-10-CM

## 2016-09-01 DIAGNOSIS — C211 Malignant neoplasm of anal canal: Secondary | ICD-10-CM | POA: Diagnosis not present

## 2016-09-01 DIAGNOSIS — R109 Unspecified abdominal pain: Secondary | ICD-10-CM

## 2016-09-01 DIAGNOSIS — C221 Intrahepatic bile duct carcinoma: Secondary | ICD-10-CM

## 2016-09-01 DIAGNOSIS — C7801 Secondary malignant neoplasm of right lung: Secondary | ICD-10-CM

## 2016-09-01 DIAGNOSIS — R110 Nausea: Secondary | ICD-10-CM | POA: Diagnosis not present

## 2016-09-01 DIAGNOSIS — Z95828 Presence of other vascular implants and grafts: Secondary | ICD-10-CM

## 2016-09-01 LAB — CBC WITH DIFFERENTIAL/PLATELET
BASO%: 0 % (ref 0.0–2.0)
Basophils Absolute: 0 10*3/uL (ref 0.0–0.1)
EOS%: 1.4 % (ref 0.0–7.0)
Eosinophils Absolute: 0 10*3/uL (ref 0.0–0.5)
HCT: 31.8 % — ABNORMAL LOW (ref 34.8–46.6)
HGB: 10.8 g/dL — ABNORMAL LOW (ref 11.6–15.9)
LYMPH%: 57.1 % — ABNORMAL HIGH (ref 14.0–49.7)
MCH: 28.3 pg (ref 25.1–34.0)
MCHC: 34 g/dL (ref 31.5–36.0)
MCV: 83.2 fL (ref 79.5–101.0)
MONO#: 0.2 10*3/uL (ref 0.1–0.9)
MONO%: 9.2 % (ref 0.0–14.0)
NEUT#: 0.7 10*3/uL — ABNORMAL LOW (ref 1.5–6.5)
NEUT%: 32.3 % — ABNORMAL LOW (ref 38.4–76.8)
Platelets: 231 10*3/uL (ref 145–400)
RBC: 3.82 10*6/uL (ref 3.70–5.45)
RDW: 13.5 % (ref 11.2–14.5)
WBC: 2.2 10*3/uL — ABNORMAL LOW (ref 3.9–10.3)
lymph#: 1.2 10*3/uL (ref 0.9–3.3)

## 2016-09-01 LAB — COMPREHENSIVE METABOLIC PANEL
ALT: 28 U/L (ref 0–55)
AST: 23 U/L (ref 5–34)
Albumin: 3.8 g/dL (ref 3.5–5.0)
Alkaline Phosphatase: 39 U/L — ABNORMAL LOW (ref 40–150)
Anion Gap: 8 mEq/L (ref 3–11)
BUN: <4 mg/dL — ABNORMAL LOW (ref 7.0–26.0)
CO2: 25 mEq/L (ref 22–29)
Calcium: 8.9 mg/dL (ref 8.4–10.4)
Chloride: 105 mEq/L (ref 98–109)
Creatinine: 0.7 mg/dL (ref 0.6–1.1)
EGFR: >90 mL/min/{1.73_m2} (ref >90)
Glucose: 88 mg/dl (ref 70–140)
Potassium: 4.1 mEq/L (ref 3.5–5.1)
Sodium: 138 mEq/L (ref 136–145)
Total Bilirubin: 0.24 mg/dL (ref 0.20–1.20)
Total Protein: 6.4 g/dL (ref 6.4–8.3)

## 2016-09-01 MED ORDER — HEPARIN SOD (PORK) LOCK FLUSH 100 UNIT/ML IV SOLN
500.0000 [IU] | Freq: Once | INTRAVENOUS | Status: AC
  Administered 2016-09-01: 500 [IU] via INTRAVENOUS
  Filled 2016-09-01: qty 5

## 2016-09-01 MED ORDER — SODIUM CHLORIDE 0.9% FLUSH
10.0000 mL | INTRAVENOUS | Status: DC | PRN
  Administered 2016-09-01: 10 mL
  Filled 2016-09-01: qty 10

## 2016-09-01 NOTE — Telephone Encounter (Signed)
Called pt & informed of Dr Ernestina Penna plan to give On-Pro with Cycle 2 D 8 & informed [employer] for auth.

## 2016-09-01 NOTE — Telephone Encounter (Signed)
Changes made to Oncology treatment plan, per Dr Burr Medico, per 09/01/16 los. Patient will review updated schedule on MyChart, per patient. 09/01/16

## 2016-09-01 NOTE — Telephone Encounter (Signed)
Appointments scheduled per 09/01/16 los.  Patient was given a copy of the AVS report and appointment schedule, per 09/01/16 los.

## 2016-09-06 ENCOUNTER — Encounter: Payer: Self-pay | Admitting: Neurology

## 2016-09-06 ENCOUNTER — Other Ambulatory Visit (HOSPITAL_BASED_OUTPATIENT_CLINIC_OR_DEPARTMENT_OTHER): Payer: Federal, State, Local not specified - PPO

## 2016-09-06 ENCOUNTER — Ambulatory Visit: Payer: Federal, State, Local not specified - PPO | Admitting: Nutrition

## 2016-09-06 ENCOUNTER — Ambulatory Visit (HOSPITAL_BASED_OUTPATIENT_CLINIC_OR_DEPARTMENT_OTHER): Payer: Federal, State, Local not specified - PPO

## 2016-09-06 ENCOUNTER — Encounter: Payer: Self-pay | Admitting: Hematology

## 2016-09-06 ENCOUNTER — Other Ambulatory Visit (HOSPITAL_BASED_OUTPATIENT_CLINIC_OR_DEPARTMENT_OTHER): Payer: Federal, State, Local not specified - PPO | Admitting: *Deleted

## 2016-09-06 ENCOUNTER — Encounter: Payer: Self-pay | Admitting: *Deleted

## 2016-09-06 ENCOUNTER — Other Ambulatory Visit: Payer: Self-pay | Admitting: Hematology

## 2016-09-06 VITALS — BP 114/71 | HR 72 | Temp 98.5°F | Resp 18

## 2016-09-06 DIAGNOSIS — C7801 Secondary malignant neoplasm of right lung: Secondary | ICD-10-CM

## 2016-09-06 DIAGNOSIS — C221 Intrahepatic bile duct carcinoma: Secondary | ICD-10-CM | POA: Diagnosis not present

## 2016-09-06 DIAGNOSIS — Z5111 Encounter for antineoplastic chemotherapy: Secondary | ICD-10-CM

## 2016-09-06 LAB — CBC WITH DIFFERENTIAL/PLATELET
BASO%: 0.4 % (ref 0.0–2.0)
Basophils Absolute: 0 10*3/uL (ref 0.0–0.1)
EOS%: 1.7 % (ref 0.0–7.0)
Eosinophils Absolute: 0 10*3/uL (ref 0.0–0.5)
HCT: 31.8 % — ABNORMAL LOW (ref 34.8–46.6)
HGB: 10.6 g/dL — ABNORMAL LOW (ref 11.6–15.9)
LYMPH%: 56.4 % — ABNORMAL HIGH (ref 14.0–49.7)
MCH: 28 pg (ref 25.1–34.0)
MCHC: 33.3 g/dL (ref 31.5–36.0)
MCV: 83.9 fL (ref 79.5–101.0)
MONO#: 0.2 10*3/uL (ref 0.1–0.9)
MONO%: 8.9 % (ref 0.0–14.0)
NEUT#: 0.8 10*3/uL — ABNORMAL LOW (ref 1.5–6.5)
NEUT%: 32.6 % — ABNORMAL LOW (ref 38.4–76.8)
Platelets: 363 10*3/uL (ref 145–400)
RBC: 3.79 10*6/uL (ref 3.70–5.45)
RDW: 14.1 % (ref 11.2–14.5)
WBC: 2.4 10*3/uL — ABNORMAL LOW (ref 3.9–10.3)
lymph#: 1.3 10*3/uL (ref 0.9–3.3)

## 2016-09-06 LAB — COMPREHENSIVE METABOLIC PANEL
ALT: 24 U/L (ref 0–55)
AST: 24 U/L (ref 5–34)
Albumin: 3.8 g/dL (ref 3.5–5.0)
Alkaline Phosphatase: 45 U/L (ref 40–150)
Anion Gap: 8 mEq/L (ref 3–11)
BUN: 4.5 mg/dL — ABNORMAL LOW (ref 7.0–26.0)
CO2: 24 mEq/L (ref 22–29)
Calcium: 8.9 mg/dL (ref 8.4–10.4)
Chloride: 106 mEq/L (ref 98–109)
Creatinine: 0.7 mg/dL (ref 0.6–1.1)
EGFR: >90 mL/min/{1.73_m2} (ref >90)
Glucose: 93 mg/dl (ref 70–140)
Potassium: 4 mEq/L (ref 3.5–5.1)
Sodium: 138 mEq/L (ref 136–145)
Total Bilirubin: 0.23 mg/dL (ref 0.20–1.20)
Total Protein: 6.4 g/dL (ref 6.4–8.3)

## 2016-09-06 LAB — MAGNESIUM: Magnesium: 2.1 mg/dl (ref 1.5–2.5)

## 2016-09-06 MED ORDER — FOSAPREPITANT DIMEGLUMINE INJECTION 150 MG
Freq: Once | INTRAVENOUS | Status: AC
  Administered 2016-09-06: 13:00:00 via INTRAVENOUS
  Filled 2016-09-06: qty 5

## 2016-09-06 MED ORDER — HEPARIN SOD (PORK) LOCK FLUSH 100 UNIT/ML IV SOLN
500.0000 [IU] | Freq: Once | INTRAVENOUS | Status: AC | PRN
  Administered 2016-09-06: 500 [IU]
  Filled 2016-09-06: qty 5

## 2016-09-06 MED ORDER — PALONOSETRON HCL INJECTION 0.25 MG/5ML
INTRAVENOUS | Status: AC
  Filled 2016-09-06: qty 5

## 2016-09-06 MED ORDER — POTASSIUM CHLORIDE 2 MEQ/ML IV SOLN
Freq: Once | INTRAVENOUS | Status: AC
  Administered 2016-09-06: 11:00:00 via INTRAVENOUS
  Filled 2016-09-06: qty 10

## 2016-09-06 MED ORDER — CISPLATIN CHEMO INJECTION 100MG/100ML
25.0000 mg/m2 | Freq: Once | INTRAVENOUS | Status: AC
  Administered 2016-09-06: 43 mg via INTRAVENOUS
  Filled 2016-09-06: qty 43

## 2016-09-06 MED ORDER — PALONOSETRON HCL INJECTION 0.25 MG/5ML
0.2500 mg | Freq: Once | INTRAVENOUS | Status: AC
  Administered 2016-09-06: 0.25 mg via INTRAVENOUS

## 2016-09-06 MED ORDER — SODIUM CHLORIDE 0.9% FLUSH
10.0000 mL | INTRAVENOUS | Status: DC | PRN
  Administered 2016-09-06: 10 mL
  Filled 2016-09-06: qty 10

## 2016-09-06 MED ORDER — SODIUM CHLORIDE 0.9 % IV SOLN
Freq: Once | INTRAVENOUS | Status: AC
  Administered 2016-09-06: 13:00:00 via INTRAVENOUS

## 2016-09-06 NOTE — Progress Notes (Signed)
Faxed FMLA paperwork to [employer] [place] @ fax number [phone]

## 2016-09-06 NOTE — Progress Notes (Signed)
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 Weight improved documented as 140.3 pounds on January 3.  This is increased from 137.2 pounds. Patient reports her appetite has increased. She is trying to increase variety in her diet. Is requesting smoothies recipes.  Nutrition diagnosis:  Unintended weight loss has improved.  Intervention:  Provided strategies for continuing high-calorie, high-protein diet. Provided recipes for smoothies per patient request. Questions were answered.  Teach back method used.  Monitoring, evaluation, goals:  Patient will tolerate increased calories and protein for weight gain./Maintenance.  Next visit: To be scheduled as needed.  **Disclaimer: This note was dictated with voice recognition software. Similar sounding words can inadvertently be transcribed and this note may contain transcription errors which may not have been corrected upon publication of note.**

## 2016-09-06 NOTE — Addendum Note (Signed)
Addended by: Truitt Merle on: 09/06/2016 10:03 AM   Modules accepted: Orders

## 2016-09-06 NOTE — Progress Notes (Signed)
Oncology Nurse Navigator Documentation  Oncology Nurse Navigator Flowsheets 09/06/2016  Navigator Location [facility]  Referral date to RadOnc/MedOnc -  Navigator Encounter Type Treatment  Abnormal Finding Date -  Confirmed Diagnosis Date -  Treatment Initiated Date -  Patient Visit Type MedOnc  Treatment Phase Active Tx--CDDP/Gemzar  Barriers/Navigation Needs Education--hair products  Education Pain/ Symptom Management  Interventions Education--OK to use conditioning gel suggested by her hair stylist, but avoid color or perms.  Referrals -  Coordination of Care -  Education Method Verbal  Support Groups/Services -  Acuity -  Time Spent with Patient 15

## 2016-09-06 NOTE — Progress Notes (Signed)
Per Dr. Burr Medico, ok to treat with Cisplatin today 09/06/2016 inspite of ANC  0.8, hold Gemzar, add Granix pending prior authorization today.

## 2016-09-06 NOTE — Patient Instructions (Signed)
[facility] Discharge Instructions for Patients Receiving Chemotherapy  Today you received the following chemotherapy agents Cisplatin  To help prevent nausea and vomiting after your treatment, we encourage you to take your nausea medication as directed.   If you develop nausea and vomiting that is not controlled by your nausea medication, call the clinic.   BELOW ARE SYMPTOMS THAT SHOULD BE REPORTED IMMEDIATELY:  *FEVER GREATER THAN 100.5 F  *CHILLS WITH OR WITHOUT FEVER  NAUSEA AND VOMITING THAT IS NOT CONTROLLED WITH YOUR NAUSEA MEDICATION  *UNUSUAL SHORTNESS OF BREATH  *UNUSUAL BRUISING OR BLEEDING  TENDERNESS IN MOUTH AND THROAT WITH OR WITHOUT PRESENCE OF ULCERS  *URINARY PROBLEMS  *BOWEL PROBLEMS  UNUSUAL RASH Items with * indicate a potential emergency and should be followed up as soon as possible.  Feel free to call the clinic you have any questions or concerns. The clinic phone number is [phone].  Please show the [place] at check-in to the Emergency Department and triage nurse.   Cisplatin injection What is this medicine? CISPLATIN (SIS pla tin) is a chemotherapy drug. It targets fast dividing cells, like cancer cells, and causes these cells to die. This medicine is used to treat many types of cancer like bladder, ovarian, and testicular cancers. This medicine may be used for other purposes; ask your health care provider or pharmacist if you have questions. COMMON BRAND NAME(S): Platinol, Platinol -AQ What should I tell my health care provider before I take this medicine? They need to know if you have any of these conditions: -blood disorders -hearing problems -kidney disease -recent or ongoing radiation therapy -an unusual or allergic reaction to cisplatin, carboplatin, other chemotherapy, other medicines, foods, dyes, or preservatives -pregnant or trying to get pregnant -breast-feeding How should I use this medicine? This  drug is given as an infusion into a vein. It is administered in a hospital or clinic by a specially trained health care professional. Talk to your pediatrician regarding the use of this medicine in children. Special care may be needed. Overdosage: If you think you have taken too much of this medicine contact a poison control center or emergency room at once. NOTE: This medicine is only for you. Do not share this medicine with others. What if I miss a dose? It is important not to miss a dose. Call your doctor or health care professional if you are unable to keep an appointment. What may interact with this medicine? -dofetilide -foscarnet -medicines for seizures -medicines to increase blood counts like filgrastim, pegfilgrastim, sargramostim -probenecid -pyridoxine used with altretamine -rituximab -some antibiotics like amikacin, gentamicin, neomycin, polymyxin B, streptomycin, tobramycin -sulfinpyrazone -vaccines -zalcitabine Talk to your doctor or health care professional before taking any of these medicines: -acetaminophen -aspirin -ibuprofen -ketoprofen -naproxen This list may not describe all possible interactions. Give your health care provider a list of all the medicines, herbs, non-prescription drugs, or dietary supplements you use. Also tell them if you smoke, drink alcohol, or use illegal drugs. Some items may interact with your medicine. What should I watch for while using this medicine? Your condition will be monitored carefully while you are receiving this medicine. You will need important blood work done while you are taking this medicine. This drug may make you feel generally unwell. This is not uncommon, as chemotherapy can affect healthy cells as well as cancer cells. Report any side effects. Continue your course of treatment even though you feel ill unless your doctor tells you to stop.  In some cases, you may be given additional medicines to help with side effects. Follow  all directions for their use. Call your doctor or health care professional for advice if you get a fever, chills or sore throat, or other symptoms of a cold or flu. Do not treat yourself. This drug decreases your body's ability to fight infections. Try to avoid being around people who are sick. This medicine may increase your risk to bruise or bleed. Call your doctor or health care professional if you notice any unusual bleeding. Be careful brushing and flossing your teeth or using a toothpick because you may get an infection or bleed more easily. If you have any dental work done, tell your dentist you are receiving this medicine. Avoid taking products that contain aspirin, acetaminophen, ibuprofen, naproxen, or ketoprofen unless instructed by your doctor. These medicines may hide a fever. Do not become pregnant while taking this medicine. Women should inform their doctor if they wish to become pregnant or think they might be pregnant. There is a potential for serious side effects to an unborn child. Talk to your health care professional or pharmacist for more information. Do not breast-feed an infant while taking this medicine. Drink fluids as directed while you are taking this medicine. This will help protect your kidneys. Call your doctor or health care professional if you get diarrhea. Do not treat yourself. What side effects may I notice from receiving this medicine? Side effects that you should report to your doctor or health care professional as soon as possible: -allergic reactions like skin rash, itching or hives, swelling of the face, lips, or tongue -signs of infection - fever or chills, cough, sore throat, pain or difficulty passing urine -signs of decreased platelets or bleeding - bruising, pinpoint red spots on the skin, black, tarry stools, nosebleeds -signs of decreased red blood cells - unusually weak or tired, fainting spells, lightheadedness -breathing problems -changes in  hearing -gout pain -low blood counts - This drug may decrease the number of white blood cells, red blood cells and platelets. You may be at increased risk for infections and bleeding. -nausea and vomiting -pain, swelling, redness or irritation at the injection site -pain, tingling, numbness in the hands or feet -problems with balance, movement -trouble passing urine or change in the amount of urine Side effects that usually do not require medical attention (report to your doctor or health care professional if they continue or are bothersome): -changes in vision -loss of appetite -metallic taste in the mouth or changes in taste This list may not describe all possible side effects. Call your doctor for medical advice about side effects. You may report side effects to FDA at 1-800-FDA-1088. Where should I keep my medicine? This drug is given in a hospital or clinic and will not be stored at home. NOTE: This sheet is a summary. It may not cover all possible information. If you have questions about this medicine, talk to your doctor, pharmacist, or health care provider.  2017 Elsevier/Gold Standard (2007-11-21 14:40:54)

## 2016-09-07 ENCOUNTER — Telehealth: Payer: Self-pay | Admitting: Hematology

## 2016-09-07 ENCOUNTER — Other Ambulatory Visit: Payer: Self-pay | Admitting: *Deleted

## 2016-09-07 ENCOUNTER — Ambulatory Visit (HOSPITAL_BASED_OUTPATIENT_CLINIC_OR_DEPARTMENT_OTHER): Payer: Federal, State, Local not specified - PPO

## 2016-09-07 ENCOUNTER — Telehealth: Payer: Self-pay | Admitting: *Deleted

## 2016-09-07 ENCOUNTER — Other Ambulatory Visit: Payer: Self-pay | Admitting: Hematology

## 2016-09-07 VITALS — BP 118/70 | HR 79 | Temp 99.4°F | Resp 18

## 2016-09-07 DIAGNOSIS — C221 Intrahepatic bile duct carcinoma: Secondary | ICD-10-CM

## 2016-09-07 DIAGNOSIS — Z5189 Encounter for other specified aftercare: Secondary | ICD-10-CM | POA: Diagnosis not present

## 2016-09-07 DIAGNOSIS — C7801 Secondary malignant neoplasm of right lung: Secondary | ICD-10-CM

## 2016-09-07 MED ORDER — TBO-FILGRASTIM 480 MCG/0.8ML ~~LOC~~ SOSY
480.0000 ug | PREFILLED_SYRINGE | Freq: Once | SUBCUTANEOUS | Status: AC
  Administered 2016-09-07: 480 ug via SUBCUTANEOUS
  Filled 2016-09-07: qty 0.8

## 2016-09-07 MED ORDER — TBO-FILGRASTIM 480 MCG/0.8ML ~~LOC~~ SOSY: 480.0000 ug | PREFILLED_SYRINGE | Freq: Once | SUBCUTANEOUS | 0 refills | Status: AC

## 2016-09-07 NOTE — Telephone Encounter (Signed)
returned phone call to pt's spouse in regards to [facility] paperwork, advised that it was faxed to [facility] [place] on 09/06/16 and confirmed fax number that it was faxed to

## 2016-09-07 NOTE — Patient Instructions (Signed)
Filgrastim, G-CSF injection What is this medicine? FILGRASTIM, G-CSF (fil GRA stim) is a granulocyte colony-stimulating factor that stimulates the growth of neutrophils, a type of white blood cell (WBC) important in the body's fight against infection. It is used to reduce the incidence of fever and infection in patients with certain types of cancer who are receiving chemotherapy that affects the bone marrow, to stimulate blood cell production for removal of WBCs from the body prior to a bone marrow transplantation, to reduce the incidence of fever and infection in patients who have severe chronic neutropenia, and to improve survival outcomes following high-dose radiation exposure that is toxic to the bone marrow. This medicine may be used for other purposes; ask your health care provider or pharmacist if you have questions. COMMON BRAND NAME(S): Neupogen, Zarxio What should I tell my health care provider before I take this medicine? They need to know if you have any of these conditions: -kidney disease -latex allergy -ongoing radiation therapy -sickle cell disease -an unusual or allergic reaction to filgrastim, pegfilgrastim, other medicines, foods, dyes, or preservatives -pregnant or trying to get pregnant -breast-feeding How should I use this medicine? This medicine is for injection under the skin or infusion into a vein. As an infusion into a vein, it is usually given by a health care professional in a hospital or clinic setting. If you get this medicine at home, you will be taught how to prepare and give this medicine. Refer to the Instructions for Use that come with your medication packaging. Use exactly as directed. Take your medicine at regular intervals. Do not take your medicine more often than directed. It is important that you put your used needles and syringes in a special sharps container. Do not put them in a trash can. If you do not have a sharps container, call your pharmacist or  healthcare provider to get one. Talk to your pediatrician regarding the use of this medicine in children. While this drug may be prescribed for children as young as 7 months for selected conditions, precautions do apply. Overdosage: If you think you have taken too much of this medicine contact a poison control center or emergency room at once. NOTE: This medicine is only for you. Do not share this medicine with others. What if I miss a dose? It is important not to miss your dose. Call your doctor or health care professional if you miss a dose. What may interact with this medicine? This medicine may interact with the following medications: -medicines that may cause a release of neutrophils, such as lithium This list may not describe all possible interactions. Give your health care provider a list of all the medicines, herbs, non-prescription drugs, or dietary supplements you use. Also tell them if you smoke, drink alcohol, or use illegal drugs. Some items may interact with your medicine. What should I watch for while using this medicine? You may need blood work done while you are taking this medicine. What side effects may I notice from receiving this medicine? Side effects that you should report to your doctor or health care professional as soon as possible: -allergic reactions like skin rash, itching or hives, swelling of the face, lips, or tongue -dizziness or feeling faint -fever -pain, redness, or irritation at site where injected -pinpoint red spots on the skin -shortness of breath or breathing problems -signs and symptoms of kidney injury like trouble passing urine, change in the amount of urine, or red or dark-brown urine -stomach or side pain,  or pain at the shoulder -swelling -tiredness - unusual bleeding or bruising Side effects that usually do not require medical attention (report to your doctor or health care professional if they continue or are bothersome): -bone  pain -headache -muscle pain This list may not describe all possible side effects. Call your doctor for medical advice about side effects. You may report side effects to FDA at 1-800-FDA-1088. Where should I keep my medicine? Keep out of the reach of children. Store in a refrigerator between 2 and 8 degrees C (36 and 46 degrees F). Do not freeze. Keep in carton to protect from light. Throw away this medicine if vials or syringes are left out of the refrigerator for more than 24 hours. Throw away any unused medicine after the expiration date. NOTE: This sheet is a summary. It may not cover all possible information. If you have questions about this medicine, talk to your doctor, pharmacist, or health care provider.  2017 Elsevier/Gold Standard (2015-03-05 10:57:13)

## 2016-09-07 NOTE — Telephone Encounter (Signed)
Pt called requesting a call back from nurse.  Spoke with pt and was informed that pt noticed inside of right ankle dark discoloration.  Denied pain, denied redness and/or tenderness, weight bearing fine, ambulating fine.  Pt just wanted to report the discoloration.  Instructed pt to continue monitoring right ankle; if change in symptoms, pt understood to call office back. Explanations given to pt about her scheduled appts.  Pt voiced understanding. Pt's   Phone       [phone].

## 2016-09-08 ENCOUNTER — Other Ambulatory Visit: Payer: Federal, State, Local not specified - PPO

## 2016-09-08 ENCOUNTER — Encounter (HOSPITAL_BASED_OUTPATIENT_CLINIC_OR_DEPARTMENT_OTHER): Payer: Self-pay | Admitting: *Deleted

## 2016-09-08 ENCOUNTER — Emergency Department (HOSPITAL_BASED_OUTPATIENT_CLINIC_OR_DEPARTMENT_OTHER): Payer: Federal, State, Local not specified - PPO

## 2016-09-08 ENCOUNTER — Other Ambulatory Visit: Payer: Self-pay

## 2016-09-08 ENCOUNTER — Ambulatory Visit: Payer: Federal, State, Local not specified - PPO

## 2016-09-08 ENCOUNTER — Emergency Department (HOSPITAL_BASED_OUTPATIENT_CLINIC_OR_DEPARTMENT_OTHER)
Admission: EM | Admit: 2016-09-08 | Discharge: 2016-09-08 | Disposition: A | Discharge status: Home / Self Care | Payer: Federal, State, Local not specified - PPO | Attending: Emergency Medicine | Admitting: Emergency Medicine

## 2016-09-08 DIAGNOSIS — Z853 Personal history of malignant neoplasm of breast: Secondary | ICD-10-CM | POA: Diagnosis not present

## 2016-09-08 DIAGNOSIS — R5383 Other fatigue: Secondary | ICD-10-CM | POA: Insufficient documentation

## 2016-09-08 DIAGNOSIS — Z79899 Other long term (current) drug therapy: Secondary | ICD-10-CM | POA: Diagnosis not present

## 2016-09-08 DIAGNOSIS — R1013 Epigastric pain: Secondary | ICD-10-CM | POA: Diagnosis not present

## 2016-09-08 DIAGNOSIS — R002 Palpitations: Secondary | ICD-10-CM | POA: Insufficient documentation

## 2016-09-08 DIAGNOSIS — Z7982 Long term (current) use of aspirin: Secondary | ICD-10-CM | POA: Insufficient documentation

## 2016-09-08 LAB — COMPREHENSIVE METABOLIC PANEL
ALT: 24 U/L (ref 14–54)
AST: 31 U/L (ref 15–41)
Albumin: 4.3 g/dL (ref 3.5–5.0)
Alkaline Phosphatase: 39 U/L (ref 38–126)
Anion gap: 10 (ref 5–15)
BUN: 8 mg/dL (ref 6–20)
CO2: 20 mmol/L — ABNORMAL LOW (ref 22–32)
Calcium: 9.4 mg/dL (ref 8.9–10.3)
Chloride: 107 mmol/L (ref 101–111)
Creatinine, Ser: 0.61 mg/dL (ref 0.44–1.00)
GFR calc Af Amer: >60 mL/min (ref >60)
GFR calc non Af Amer: >60 mL/min (ref >60)
Glucose, Bld: 94 mg/dL (ref 65–99)
Potassium: 3.5 mmol/L (ref 3.5–5.1)
Sodium: 137 mmol/L (ref 135–145)
Total Bilirubin: 0.6 mg/dL (ref 0.3–1.2)
Total Protein: 7 g/dL (ref 6.5–8.1)

## 2016-09-08 LAB — CBC WITH DIFFERENTIAL/PLATELET
Basophils Absolute: 0 10*3/uL (ref 0.0–0.1)
Basophils Relative: 0 %
Eosinophils Absolute: 0 10*3/uL (ref 0.0–0.7)
Eosinophils Relative: 0 %
HCT: 32.6 % — ABNORMAL LOW (ref 36.0–46.0)
Hemoglobin: 11.1 g/dL — ABNORMAL LOW (ref 12.0–15.0)
Lymphocytes Relative: 19 %
Lymphs Abs: 2.5 10*3/uL (ref 0.7–4.0)
MCH: 28.2 pg (ref 26.0–34.0)
MCHC: 34 g/dL (ref 30.0–36.0)
MCV: 82.7 fL (ref 78.0–100.0)
Monocytes Absolute: 1.3 10*3/uL — ABNORMAL HIGH (ref 0.1–1.0)
Monocytes Relative: 10 %
Neutro Abs: 9.6 10*3/uL — ABNORMAL HIGH (ref 1.7–7.7)
Neutrophils Relative %: 71 %
Platelets: 491 10*3/uL — ABNORMAL HIGH (ref 150–400)
RBC: 3.94 MIL/uL (ref 3.87–5.11)
RDW: 13.6 % (ref 11.5–15.5)
WBC: 13.4 10*3/uL — ABNORMAL HIGH (ref 4.0–10.5)

## 2016-09-08 LAB — URINALYSIS, ROUTINE W REFLEX MICROSCOPIC
Bilirubin Urine: NEGATIVE
Glucose, UA: NEGATIVE mg/dL
Hgb urine dipstick: NEGATIVE
Ketones, ur: NEGATIVE mg/dL
Leukocytes, UA: NEGATIVE
Nitrite: NEGATIVE
Protein, ur: NEGATIVE mg/dL
Specific Gravity, Urine: 1.003 — ABNORMAL LOW (ref 1.005–1.030)
pH: 7.5 (ref 5.0–8.0)

## 2016-09-08 LAB — TROPONIN I: Troponin I: <0.03 ng/mL (ref <0.03)

## 2016-09-08 MED ORDER — LORAZEPAM 2 MG/ML IJ SOLN
1.0000 mg | Freq: Once | INTRAMUSCULAR | Status: AC
  Administered 2016-09-08: 1 mg via INTRAVENOUS
  Filled 2016-09-08: qty 1

## 2016-09-08 MED ORDER — DIAZEPAM 5 MG/ML IJ SOLN: 2.5000 mg | Freq: Once | INTRAMUSCULAR | Status: DC

## 2016-09-08 NOTE — ED Notes (Signed)
ED Provider at bedside. 

## 2016-09-08 NOTE — ED Notes (Signed)
Pt given coke, graham crackers and peanut butter per request.

## 2016-09-08 NOTE — ED Triage Notes (Addendum)
Pt c/o "palpatations" x 3 hrs. Denies cp or n/v. Received  chemo x 2 days ago

## 2016-09-08 NOTE — ED Notes (Signed)
Patient transported to X-ray 

## 2016-09-08 NOTE — ED Provider Notes (Signed)
[facility] Provider Note   CSN: [id number] Arrival date & time: 09/08/16  0054     History   Chief Complaint Chief Complaint  Patient presents with  . Palpitations    HPI Nancy [name] is a 51 y.o. female.  The history is provided by the patient and the spouse. No language interpreter was used.  Palpitations      Nancy [name] is a 51 y.o. female who presents to the Emergency Department complaining of palpitations.  She has a history of cancer and is currently undergoing chemotherapy. 2 days ago she received a treatment with cisplatin. Yesterday she went to the office to receive an injection for her white blood cell count. Since her chemotherapy she has been feeling fatigued and generalized malaise. This evening she developed chest spasms beneath bilateral breasts as well as palpitations. No fevers, shortness of breath, vomiting, leg swelling. Symptoms are severe and constant in nature.  Past Medical History:  Diagnosis Date  . Allergy    SEASONAL  . Anemia    [employer]  . Atopic eczema   . Fibrocystic breast disease   . GERD (gastroesophageal reflux disease)   . Hx of migraines    seasonal   . Pneumonia    6 years ago   . TIA (transient ischemic attack)     Patient Active Problem List   Diagnosis Date Noted  . Abnormal finding on GI tract imaging   . Metastatic cholangiocarcinoma to lung ([place])   . Acute bronchitis 10/01/2015  . Vestibular migraine 06/20/2015  . TIA (transient ischemic attack) 06/12/2015  . Hyperlipidemia LDL goal <70 06/12/2015  . Diplopia 06/09/2015  . Vertigo 06/09/2015  . Right leg pain 04/12/2014  . Hyperthyroidism 03/13/2014  . MAMMOGRAM, ABNORMAL 10/31/2008  . ECZEMA, ATOPIC 10/15/2008  . FIBROCYSTIC BREAST DISEASE, HX OF 10/15/2008    Past Surgical History:  Procedure Laterality Date  . EUS N/A 07/07/2016   Procedure: UPPER ENDOSCOPIC ULTRASOUND (EUS) RADIAL;  Surgeon: [name], MD;  Location: [facility]  ENDOSCOPY;  Service: Endoscopy;  Laterality: N/A;  . IR GENERIC HISTORICAL  08/03/2016   IR US GUIDE VASC ACCESS RIGHT 08/03/2016 Corrie Mckusick, DO [facility]  . IR GENERIC HISTORICAL  08/03/2016   IR FLUORO GUIDE PORT INSERTION RIGHT 08/03/2016 Corrie Mckusick, DO [facility]  . MYOMECTOMY  2002    OB History    No data available       Home Medications    Prior to Admission medications   Medication Sig Start Date End Date Taking? Authorizing Provider  ALPRAZolam Duanne Moron) 0.5 MG tablet Take 1 tablet (0.5 mg total) by mouth as needed for anxiety. 06/23/16   Jerene Bears, MD  aspirin 81 MG tablet Take 81 mg by mouth daily.    Historical Provider, MD  CARAFATE 1 GM/10ML suspension TAKE 10 MLS (1 G TOTAL) BY MOUTH 4 (FOUR) TIMES DAILY - WITH MEALS AND AT BEDTIME. Patient not taking: Reported on 09/01/2016 07/26/16   Jerene Bears, MD  cetirizine (ZYRTEC) 10 MG tablet Take 10 mg by mouth daily as needed. For allergies     Historical Provider, MD  Cholecalciferol (VITAMIN D3) 5000 UNITS CAPS Take 1 capsule by mouth daily.    Historical Provider, MD  Ciclopirox 1 % shampoo Use as directed 2x a week for up to 4 weeks 08/21/15   Alferd Apa Lowne Chase, DO  COLOSTRUM PO Take 1.25 g by mouth 2 (two) times daily. 1.25 grams twice daily  Historical Provider, MD  dicyclomine (BENTYL) 20 MG tablet 3 (three) times daily as needed.  07/22/16   Historical Provider, MD  dronabinol (MARINOL) 2.5 MG capsule Take 1 capsule (2.5 mg total) by mouth 2 (two) times daily before a meal. 08/18/16   Truitt Merle, MD  HYDROcodone-acetaminophen (NORCO) 5-325 MG tablet Take 1-2 tablets by mouth every 6 (six) hours as needed for severe pain. 08/26/16   Susanne Borders, NP  lidocaine-prilocaine (EMLA) cream Apply 1 application topically as needed. Apply to portacath 1 1/2 hours - 2 hours prior to procedures as needed. 08/06/16   Truitt Merle, MD  meclizine (ANTIVERT) 50 MG tablet Take 1 tablet (50 mg total) by mouth 3 (three) times  daily as needed. 08/29/15   Yvonne R Lowne Chase, DO  Menaquinone-7 (VITAMIN K2) 40 MCG TABS Take by mouth.    Historical Provider, MD  mometasone (ELOCON) 0.1 % lotion Apply topically daily. 08/21/15   Alferd Apa Lowne Chase, DO  NON FORMULARY 1 capsule 1 day or 1 dose. Pregnelone, pt reports she takes daily.    Historical Provider, MD  Nutritional Supplements (DHEA) 15-50 MG CAPS Take 1 capsule by mouth 3 (three) times a week.    Historical Provider, MD  nystatin (MYCOSTATIN) 100000 UNIT/ML suspension Take 5 mLs (500,000 Units total) by mouth 4 (four) times daily. 08/18/16   Truitt Merle, MD  omeprazole (PRILOSEC) 40 MG capsule Take 1 capsule (40 mg total) by mouth 2 (two) times daily before a meal. 06/23/16   Jerene Bears, MD  ondansetron (ZOFRAN) 8 MG tablet Take 1 tablet (8 mg total) by mouth every 8 (eight) hours as needed for nausea or vomiting. 08/16/16   Truitt Merle, MD  Prenatal Vit-Fe Fumarate-FA (PRENATAL MULTIVITAMIN) TABS tablet Take 2 tablets by mouth daily at 12 noon.    Historical Provider, MD  sulfamethoxazole-trimethoprim (BACTRIM DS,SEPTRA DS) 800-160 MG tablet Take 1 tablet by mouth 2 (two) times daily.    Historical Provider, MD  triamcinolone cream (KENALOG) 0.1 % Apply topically.    Historical Provider, MD  VITAMIN K PO Take 1 capsule by mouth daily.    Historical Provider, MD    Family History Family History  Problem Relation Age of Onset  . Gout Mother   . Diabetes Mother   . Hypertension Father   . Diabetes Father   . Breast cancer Paternal Aunt   . Cancer Maternal Grandfather     throat cancer   . Cancer Cousin 59    GI cancer   . Thyroid disease Neg Hx     Social History Social History  Substance Use Topics  . Smoking status: Never Smoker  . Smokeless tobacco: Never Used  . Alcohol use Yes     Comment: maybe 3 times a year     Allergies   Penicillins and Lipitor [atorvastatin]   Review of Systems Review of Systems  Cardiovascular: Positive for  palpitations.  All other systems reviewed and are negative.    Physical Exam Updated Vital Signs BP 136/78   Pulse 83   Temp 98.5 F (36.9 C)   Resp 18   Ht '5\' 6"'$  (1.676 m)   Wt 140 lb (63.5 kg)   LMP 05/18/2016 (Approximate)   SpO2 97%   BMI 22.60 kg/m   Physical Exam  Constitutional: She is oriented to person, place, and time. She appears well-developed and well-nourished.  Uncomfortable appearing  HENT:  Head: Normocephalic and atraumatic.  Cardiovascular: Normal rate and regular  rhythm.   No murmur heard. Pulmonary/Chest: Effort normal and breath sounds normal. No respiratory distress.  Abdominal: Soft. There is no rebound and no guarding.  Mild epigastric tenderness  Musculoskeletal: She exhibits no edema or tenderness.  Neurological: She is alert and oriented to person, place, and time.  Skin: Skin is warm and dry.  Psychiatric:  Anxious  Nursing note and vitals reviewed.    ED Treatments / Results  Labs (all labs ordered are listed, but only abnormal results are displayed) Labs Reviewed  CBC WITH DIFFERENTIAL/PLATELET - Abnormal; Notable for the following:       Result Value   WBC 13.4 (*)    Hemoglobin 11.1 (*)    HCT 32.6 (*)    Platelets 491 (*)    Neutro Abs 9.6 (*)    Monocytes Absolute 1.3 (*)    All other components within normal limits  COMPREHENSIVE METABOLIC PANEL - Abnormal; Notable for the following:    CO2 20 (*)    All other components within normal limits  URINALYSIS, ROUTINE W REFLEX MICROSCOPIC - Abnormal; Notable for the following:    Specific Gravity, Urine 1.003 (*)    All other components within normal limits  TROPONIN I    EKG  EKG Interpretation None       Radiology Dg Chest 2 View  Result Date: 09/08/2016 CLINICAL DATA:  Palpitations for 3 hours. Burning sensation across the lower chest and epigastric area. History of cholangiocarcinoma on chemotherapy. EXAM: CHEST  2 VIEW COMPARISON:  05/27/2016 FINDINGS: Power port  type central venous catheter with tip over the low SVC region. Normal heart size and pulmonary vascularity. No focal airspace disease or consolidation in the lungs. No blunting of costophrenic angles. No pneumothorax. Mediastinal contours appear intact. IMPRESSION: No active cardiopulmonary disease. Electronically Signed   By: Lucienne Capers M.D.   On: 09/08/2016 03:10    Procedures Procedures (including critical care time)  Medications Ordered in ED Medications  LORazepam (ATIVAN) injection 1 mg (1 mg Intravenous Given 09/08/16 0239)     Initial Impression / Assessment and Plan / ED Course  I have reviewed the triage vital signs and the nursing notes.  Pertinent labs & imaging results that were available during my care of the patient were reviewed by me and considered in my medical decision making (see chart for details).  Clinical Course     Patient on chemotherapy for metastatic cholangiocarcinoma here with palpitations and spasms of her lower chest. She is uncomfortable in exam with no respiratory distress. After treatment with Ativan in the emergency department she is feeling significantly improved with spasms resolved. Current picture is not consistent with ACS, myocarditis, PE, dissection, perforated viscus. CBC with leukocytosis but patient just received Neupogen injection earlier today. No evidence of serious bacterial infection. Discussed the importance of oncology follow-up as well as home care and return precautions.  Final Clinical Impressions(s) / ED Diagnoses   Final diagnoses:  Palpitations    New Prescriptions New Prescriptions   No medications on file     Quintella Reichert, MD 09/08/16 0403

## 2016-09-09 ENCOUNTER — Ambulatory Visit: Payer: Federal, State, Local not specified - PPO

## 2016-09-09 DIAGNOSIS — C221 Intrahepatic bile duct carcinoma: Secondary | ICD-10-CM

## 2016-09-09 DIAGNOSIS — C7801 Secondary malignant neoplasm of right lung: Principal | ICD-10-CM

## 2016-09-09 MED ORDER — SODIUM CHLORIDE 0.9% FLUSH
10.0000 mL | INTRAVENOUS | Status: AC | PRN
  Administered 2016-09-09: 10 mL
  Filled 2016-09-09: qty 10

## 2016-09-09 MED ORDER — ONDANSETRON HCL 4 MG/2ML IJ SOLN
8.0000 mg | Freq: Once | INTRAMUSCULAR | Status: AC
  Administered 2016-09-09: 8 mg via INTRAVENOUS

## 2016-09-09 MED ORDER — ONDANSETRON HCL 4 MG/2ML IJ SOLN
INTRAMUSCULAR | Status: AC
  Filled 2016-09-09: qty 4

## 2016-09-09 MED ORDER — SODIUM CHLORIDE 0.9 % IV SOLN
Freq: Once | INTRAVENOUS | Status: AC
  Administered 2016-09-09: 14:00:00 via INTRAVENOUS

## 2016-09-09 MED ORDER — ALTEPLASE 2 MG IJ SOLR
2.0000 mg | Freq: Once | INTRAMUSCULAR | Status: AC | PRN
  Filled 2016-09-09: qty 2

## 2016-09-09 MED ORDER — HEPARIN SOD (PORK) LOCK FLUSH 100 UNIT/ML IV SOLN
500.0000 [IU] | Freq: Once | INTRAVENOUS | Status: AC | PRN
  Administered 2016-09-09: 500 [IU]
  Filled 2016-09-09: qty 5

## 2016-09-09 MED ORDER — SODIUM CHLORIDE 0.9 % IV SOLN: Freq: Once | INTRAVENOUS | Status: DC

## 2016-09-09 MED ORDER — HEPARIN SOD (PORK) LOCK FLUSH 100 UNIT/ML IV SOLN
250.0000 [IU] | Freq: Once | INTRAVENOUS | Status: AC | PRN
  Filled 2016-09-09: qty 5

## 2016-09-09 NOTE — Patient Instructions (Signed)
Dehydration, Adult Dehydration is a condition in which there is not enough fluid or water in the body. This happens when you lose more fluids than you take in. Important organs, such as the kidneys, brain, and heart, cannot function without a proper amount of fluids. Any loss of fluids from the body can lead to dehydration. Dehydration can range from mild to severe. This condition should be treated right away to prevent it from becoming severe. What are the causes? This condition may be caused by:  Vomiting.  Diarrhea.  Excessive sweating, such as from heat exposure or exercise.  Not drinking enough fluid, especially:  When ill.  While doing activity that requires a lot of energy.  Excessive urination.  Fever.  Infection.  Certain medicines, such as medicines that cause the body to lose excess fluid (diuretics).  Inability to access safe drinking water.  Reduced physical ability to get adequate water and food. What increases the risk? This condition is more likely to develop in people:  Who have a poorly controlled long-term (chronic) illness, such as diabetes, heart disease, or kidney disease.  Who are age 65 or older.  Who are disabled.  Who live in a place with high altitude.  Who play endurance sports. What are the signs or symptoms? Symptoms of mild dehydration may include:   Thirst.  Dry lips.  Slightly dry mouth.  Dry, warm skin.  Dizziness. Symptoms of moderate dehydration may include:   Very dry mouth.  Muscle cramps.  Dark urine. Urine may be the color of tea.  Decreased urine production.  Decreased tear production.  Heartbeat that is irregular or faster than normal (palpitations).  Headache.  Light-headedness, especially when you stand up from a sitting position.  Fainting (syncope). Symptoms of severe dehydration may include:   Changes in skin, such as:  Cold and clammy skin.  Blotchy (mottled) or pale skin.  Skin that does  not quickly return to normal after being lightly pinched and released (poor skin turgor).  Changes in body fluids, such as:  Extreme thirst.  No tear production.  Inability to sweat when body temperature is high, such as in hot weather.  Very little urine production.  Changes in vital signs, such as:  Weak pulse.  Pulse that is more than 100 beats a minute when sitting still.  Rapid breathing.  Low blood pressure.  Other changes, such as:  Sunken eyes.  Cold hands and feet.  Confusion.  Lack of energy (lethargy).  Difficulty waking up from sleep.  Short-term weight loss.  Unconsciousness. How is this diagnosed? This condition is diagnosed based on your symptoms and a physical exam. Blood and urine tests may be done to help confirm the diagnosis. How is this treated? Treatment for this condition depends on the severity. Mild or moderate dehydration can often be treated at home. Treatment should be started right away. Do not wait until dehydration becomes severe. Severe dehydration is an emergency and it needs to be treated in a hospital. Treatment for mild dehydration may include:   Drinking more fluids.  Replacing salts and minerals in your blood (electrolytes) that you may have lost. Treatment for moderate dehydration may include:   Drinking an oral rehydration solution (ORS). This is a drink that helps you replace fluids and electrolytes (rehydrate). It can be found at pharmacies and retail stores. Treatment for severe dehydration may include:   Receiving fluids through an IV tube.  Receiving an electrolyte solution through a feeding tube that is   passed through your nose and into your stomach (nasogastric tube, or NG tube).  Correcting any abnormalities in electrolytes.  Treating the underlying cause of dehydration. Follow these instructions at home:  If directed by your health care provider, drink an ORS:  Make an ORS by following instructions on the  package.  Start by drinking small amounts, about  cup (120 mL) every 5-10 minutes.  Slowly increase how much you drink until you have taken the amount recommended by your health care provider.  Drink enough clear fluid to keep your urine clear or pale yellow. If you were told to drink an ORS, finish the ORS first, then start slowly drinking other clear fluids. Drink fluids such as:  Water. Do not drink only water. Doing that can lead to having too little salt (sodium) in the body (hyponatremia).  Ice chips.  Fruit juice that you have added water to (diluted fruit juice).  Low-calorie sports drinks.  Avoid:  Alcohol.  Drinks that contain a lot of sugar. These include high-calorie sports drinks, fruit juice that is not diluted, and soda.  Caffeine.  Foods that are greasy or contain a lot of fat or sugar.  Take over-the-counter and prescription medicines only as told by your health care provider.  Do not take sodium tablets. This can lead to having too much sodium in the body (hypernatremia).  Eat foods that contain a healthy balance of electrolytes, such as bananas, oranges, potatoes, tomatoes, and spinach.  Keep all follow-up visits as told by your health care provider. This is important. Contact a health care provider if:  You have abdominal pain that:  Gets worse.  Stays in one area (localizes).  You have a rash.  You have a stiff neck.  You are more irritable than usual.  You are sleepier or more difficult to wake up than usual.  You feel weak or dizzy.  You feel very thirsty.  You have urinated only a small amount of very dark urine over 6-8 hours. Get help right away if:  You have symptoms of severe dehydration.  You cannot drink fluids without vomiting.  Your symptoms get worse with treatment.  You have a fever.  You have a severe headache.  You have vomiting or diarrhea that:  Gets worse.  Does not go away.  You have blood or green matter  (bile) in your vomit.  You have blood in your stool. This may cause stool to look black and tarry.  You have not urinated in 6-8 hours.  You faint.  Your heart rate while sitting still is over 100 beats a minute.  You have trouble breathing. This information is not intended to replace advice given to you by your health care provider. Make sure you discuss any questions you have with your health care provider. Document Released: 08/16/2005 Document Revised: 03/12/2016 Document Reviewed: 10/10/2015 Elsevier Interactive Patient Education  2017 [employer]  

## 2016-09-09 NOTE — Progress Notes (Unsigned)
Pt c/o nausea @ 1600.  Zofran given Pt felt a little better at d/c.  No vomiting.

## 2016-09-13 ENCOUNTER — Ambulatory Visit (HOSPITAL_BASED_OUTPATIENT_CLINIC_OR_DEPARTMENT_OTHER): Payer: Federal, State, Local not specified - PPO

## 2016-09-13 ENCOUNTER — Ambulatory Visit (HOSPITAL_BASED_OUTPATIENT_CLINIC_OR_DEPARTMENT_OTHER): Payer: Federal, State, Local not specified - PPO | Admitting: Hematology

## 2016-09-13 ENCOUNTER — Other Ambulatory Visit (HOSPITAL_BASED_OUTPATIENT_CLINIC_OR_DEPARTMENT_OTHER): Payer: Federal, State, Local not specified - PPO

## 2016-09-13 ENCOUNTER — Encounter: Payer: Self-pay | Admitting: Hematology

## 2016-09-13 VITALS — BP 113/73 | HR 96 | Temp 98.7°F | Resp 18

## 2016-09-13 DIAGNOSIS — R109 Unspecified abdominal pain: Secondary | ICD-10-CM | POA: Diagnosis not present

## 2016-09-13 DIAGNOSIS — E860 Dehydration: Secondary | ICD-10-CM

## 2016-09-13 DIAGNOSIS — C221 Intrahepatic bile duct carcinoma: Secondary | ICD-10-CM | POA: Diagnosis not present

## 2016-09-13 DIAGNOSIS — C7801 Secondary malignant neoplasm of right lung: Secondary | ICD-10-CM

## 2016-09-13 DIAGNOSIS — R110 Nausea: Secondary | ICD-10-CM

## 2016-09-13 DIAGNOSIS — R630 Anorexia: Secondary | ICD-10-CM

## 2016-09-13 LAB — CBC WITH DIFFERENTIAL/PLATELET
BASO%: 0.5 % (ref 0.0–2.0)
Basophils Absolute: 0 10*3/uL (ref 0.0–0.1)
EOS%: 0.3 % (ref 0.0–7.0)
Eosinophils Absolute: 0 10*3/uL (ref 0.0–0.5)
HCT: 37.8 % (ref 34.8–46.6)
HGB: 12.9 g/dL (ref 11.6–15.9)
LYMPH%: 36 % (ref 14.0–49.7)
MCH: 28.6 pg (ref 25.1–34.0)
MCHC: 34.1 g/dL (ref 31.5–36.0)
MCV: 83.9 fL (ref 79.5–101.0)
MONO#: 0.5 10*3/uL (ref 0.1–0.9)
MONO%: 8.8 % (ref 0.0–14.0)
NEUT#: 3.3 10*3/uL (ref 1.5–6.5)
NEUT%: 54.4 % (ref 38.4–76.8)
Platelets: 361 10*3/uL (ref 145–400)
RBC: 4.5 10*6/uL (ref 3.70–5.45)
RDW: 14.6 % — ABNORMAL HIGH (ref 11.2–14.5)
WBC: 6.1 10*3/uL (ref 3.9–10.3)
lymph#: 2.2 10*3/uL (ref 0.9–3.3)

## 2016-09-13 LAB — COMPREHENSIVE METABOLIC PANEL
ALT: 20 U/L (ref 0–55)
AST: 23 U/L (ref 5–34)
Albumin: 4.2 g/dL (ref 3.5–5.0)
Alkaline Phosphatase: 51 U/L (ref 40–150)
Anion Gap: 17 mEq/L — ABNORMAL HIGH (ref 3–11)
BUN: 6.7 mg/dL — ABNORMAL LOW (ref 7.0–26.0)
CO2: 16 mEq/L — ABNORMAL LOW (ref 22–29)
Calcium: 9.2 mg/dL (ref 8.4–10.4)
Chloride: 103 mEq/L (ref 98–109)
Creatinine: 0.8 mg/dL (ref 0.6–1.1)
EGFR: >90 mL/min/{1.73_m2} (ref >90)
Glucose: 72 mg/dl (ref 70–140)
Potassium: 4 mEq/L (ref 3.5–5.1)
Sodium: 136 mEq/L (ref 136–145)
Total Bilirubin: 0.32 mg/dL (ref 0.20–1.20)
Total Protein: 7.3 g/dL (ref 6.4–8.3)

## 2016-09-13 MED ORDER — SODIUM CHLORIDE 0.9 % IV SOLN
Freq: Once | INTRAVENOUS | Status: AC
  Administered 2016-09-13: 09:00:00 via INTRAVENOUS

## 2016-09-13 MED ORDER — HEPARIN SOD (PORK) LOCK FLUSH 100 UNIT/ML IV SOLN
500.0000 [IU] | Freq: Once | INTRAVENOUS | Status: AC | PRN
  Administered 2016-09-13: 500 [IU]
  Filled 2016-09-13: qty 5

## 2016-09-13 MED ORDER — SODIUM CHLORIDE 0.9% FLUSH
10.0000 mL | INTRAVENOUS | Status: DC | PRN
  Administered 2016-09-13: 10 mL
  Filled 2016-09-13: qty 10

## 2016-09-13 NOTE — Progress Notes (Signed)
[place]  [phone] Fax:([phone]  Clinic Follow Up Note   Patient Care Team: [name], DO as PCP - General Jerene Bears, MD as Consulting Physician (Gastroenterology) 09/13/2016  CHIEF COMPLAINTS:  Follow up metastatic intrahepatic cholangiocarcinoma  Oncology History   Metastatic cholangiocarcinoma to lung [facility])   Staging form: Perihilar Bile Ducts, AJCC 7th Edition   - Clinical stage from 07/07/2016: Stage IVB (T2b, N1, M1) - Signed by Truitt Merle, MD on 07/28/2016      Metastatic cholangiocarcinoma to lung ([place])   05/22/2014 Procedure    Routine screening colonoscopy showed a sessile polyp measuring 4 mm in the sigmoid colon, removed, otherwise negative.      06/29/2016 Imaging    CT chest, abdomen and pelvis showed 2 indeterminant nodule in left and the right lung, 4-25m, indeterminate a hypovascular liver lesions largest in the caudate lobe measuring 2.6 cm, mild abdominal lymphadenopathy in the gastrohepatic ligament and portocaval space.      07/02/2016 Imaging    Abdominal MRI with and without contrast showed 2 lobular lesions in the caudate lobe, and is regular lymph nodes in the gastrohepatic ligament which is adjacent to the liver lesion, suspicious for malignancy. 2 small lesions in the right hepatic lobe are indeterminate, but concerning for metastasis.      07/07/2016 Procedure    EGD was negative, EUS biopsy of the liver and adjacent lymph node      07/07/2016 Initial Diagnosis    [place]ast[place]      07/07/2016 Initial Biopsy    Fine-needle aspiration of the liver lesion in caudate lobe and adjacent lymph nodes both showed metastatic adenocarcinoma.      08/11/2016 -  Chemotherapy    Cisplatin 25 mg/m, gemcitabine 1000 mg/m, on day one and 8, every 21 days         HISTORY OF PRESENTING ILLNESS:  Nancy Berkshire51y.o. female is here because of Her recently diagnosed  metastatic glandular carcinoma. She is accompanied by her husband and mother to my clinic today.  She has been having RUQ abdominal pain since mid 04/2016, she was seen by PCP and was treated for gas with GI cocktail and pepcide, which she did not help much. Her pain got worse, and radiates to right shoulder, she denies significant nausea, or bloating, BM normal, no fever, cough or dyspnea. She recently noticed mild chest discomfort in the low sternum area. She initially had the lab, ultrasound done by her primary care physician, which was unrevealing. She was referred to GI Dr. PHilarie Fredricksonand underwent EGD and CT scan, which showed a lobulated mass in the caudate lobe of liver, with at adjacent adenopathy. He underwent EUS and fine-needle biopsy of the liver mass and lymph nodes, all reviewed adenocarcinoma.  She has lost about 7lbs in the past 3 months. She has mild fatigue, but able to function at home. She has stopped working due to her abdominal pain and fatigue. She takes Norco every 1-3 times a day, and her pain seems not well controlled.  CURRENT THERAPY: Cisplatin and gemcitabine on Day 1, 8 every 21 days  INTERIM HISTORY: Nancy [name] for follow up and her C2D8 chemo. She only received cisplatin last week, due to her on recovered neutropenia. I gave her one dose of Granix the next day after chemotherapy, she developed diffuse bone achiness, fatigue, anorexia, and did not feel well overall, she was evaluated in the ED ON 1/10,  2 days after chemotherapy, was given pain medication. Her symptoms gradually improved. She feels a lot better in the past few days. Her appetite has returned. She also reported epigastric discomfort, which resolved after the first cycle chemotherapy, but started again this week. No fever or chills, no bleeding. She requests to postpone her chemotherapy today.  MEDICAL HISTORY:  Past Medical History:  Diagnosis Date  . Allergy    SEASONAL  . Anemia    HIGH SCHOOL  .  Atopic eczema   . Fibrocystic breast disease   . GERD (gastroesophageal reflux disease)   . Hx of migraines    seasonal   . Pneumonia    6 years ago   . TIA (transient ischemic attack)     SURGICAL HISTORY: Past Surgical History:  Procedure Laterality Date  . EUS N/A 07/07/2016   Procedure: UPPER ENDOSCOPIC ULTRASOUND (EUS) RADIAL;  Surgeon: [name], MD;  Location: [facility] ENDOS[facility];  Service: Endoscopy;  Laterality: N/A;  . IR GENERIC HISTORICAL  08/03/2016   IR US GUIDE VASC ACCESS RIGHT 08/03/2016 Corrie Mckusick, DO [facility]  . IR GENERIC HISTORICAL  08/03/2016   IR FLUORO GUIDE PORT INSERTION RIGHT 08/03/2016 Corrie Mckusick, DO [facility]  . MYOMECTOMY  2002    SOCIAL HISTORY: Social History   Social History  . Marital status: Married    Spouse name: N/A  . Number of children: 0  . Years of education: 52   Occupational History  . [employer]  .  [employer]   Social History Main Topics  . Smoking status: Never Smoker  . Smokeless tobacco: Never Used  . Alcohol use Yes     Comment: maybe 3 times a year  . Drug use: No  . Sexual activity: Yes    Partners: Male   Other Topics Concern  . Not on file   Social History Narrative  . No narrative on file    FAMILY HISTORY: Family History  Problem Relation Age of Onset  . Gout Mother   . Diabetes Mother   . Hypertension Father   . Diabetes Father   . Breast cancer Paternal Aunt   . Cancer Maternal Grandfather     throat cancer   . Cancer Cousin 64    GI cancer   . Thyroid disease Neg Hx     ALLERGIES:  is allergic to penicillins and lipitor [atorvastatin].  MEDICATIONS:  Current Outpatient Prescriptions  Medication Sig Dispense Refill  . ALPRAZolam (XANAX) 0.5 MG tablet Take 1 tablet (0.5 mg total) by mouth as needed for anxiety. 10 tablet 0  . aspirin 81 MG tablet Take 81 mg by mouth daily.    Marland Kitchen CARAFATE 1 GM/10ML suspension TAKE 10 MLS (1 G TOTAL) BY MOUTH 4 (FOUR) TIMES DAILY - WITH MEALS  AND AT BEDTIME. (Patient not taking: Reported on 09/01/2016) 420 mL 1  . cetirizine (ZYRTEC) 10 MG tablet Take 10 mg by mouth daily as needed. For allergies     . Cholecalciferol (VITAMIN D3) 5000 UNITS CAPS Take 1 capsule by mouth daily.    . Ciclopirox 1 % shampoo Use as directed 2x a week for up to 4 weeks 120 mL 1  . COLOSTRUM PO Take 1.25 g by mouth 2 (two) times daily. 1.25 grams twice daily    . dicyclomine (BENTYL) 20 MG tablet 3 (three) times daily as needed.     . dronabinol (MARINOL) 2.5 MG capsule Take 1 capsule (2.5 mg total)  by mouth 2 (two) times daily before a meal. 30 capsule 1  . HYDROcodone-acetaminophen (NORCO) 5-325 MG tablet Take 1-2 tablets by mouth every 6 (six) hours as needed for severe pain. 60 tablet 0  . lidocaine-prilocaine (EMLA) cream Apply 1 application topically as needed. Apply to portacath 1 1/2 hours - 2 hours prior to procedures as needed. 30 g 1  . meclizine (ANTIVERT) 50 MG tablet Take 1 tablet (50 mg total) by mouth 3 (three) times daily as needed. 30 tablet 0  . Menaquinone-7 (VITAMIN K2) 40 MCG TABS Take by mouth.    . mometasone (ELOCON) 0.1 % lotion Apply topically daily. 60 mL 0  . NON FORMULARY 1 capsule 1 day or 1 dose. Pregnelone, pt reports she takes daily.    . Nutritional Supplements (DHEA) 15-50 MG CAPS Take 1 capsule by mouth 3 (three) times a week.    . nystatin (MYCOSTATIN) 100000 UNIT/ML suspension Take 5 mLs (500,000 Units total) by mouth 4 (four) times daily. 60 mL 0  . omeprazole (PRILOSEC) 40 MG capsule Take 1 capsule (40 mg total) by mouth 2 (two) times daily before a meal. 60 capsule 11  . ondansetron (ZOFRAN) 8 MG tablet Take 1 tablet (8 mg total) by mouth every 8 (eight) hours as needed for nausea or vomiting. 20 tablet 1  . Prenatal Vit-Fe Fumarate-FA (PRENATAL MULTIVITAMIN) TABS tablet Take 2 tablets by mouth daily at 12 noon.    . sulfamethoxazole-trimethoprim (BACTRIM DS,SEPTRA DS) 800-160 MG tablet Take 1 tablet by mouth 2 (two)  times daily.    Marland Kitchen triamcinolone cream (KENALOG) 0.1 % Apply topically.    Marland Kitchen VITAMIN K PO Take 1 capsule by mouth daily.     No current facility-administered medications for this visit.    Facility-Administered Medications Ordered in Other Visits  Medication Dose Route Frequency Provider Last Rate Last Dose  . alteplase (CATHFLO ACTIVASE) injection 2 mg  2 mg Intracatheter Once PRN Truitt Merle, MD      . heparin lock flush 100 unit/mL  250 Units Intracatheter Once PRN Truitt Merle, MD      . sodium chloride flush (NS) 0.9 % injection 10 mL  10 mL Intracatheter PRN Truitt Merle, MD   10 mL at 09/09/16 1645    REVIEW OF SYSTEMS:   Constitutional: Denies fevers, chills or abnormal night sweats Eyes: Denies blurriness of vision, double vision or watery eyes Ears, nose, mouth, throat, and face: Denies mucositis or sore throat Respiratory: Denies cough, dyspnea or wheezes Cardiovascular: Denies palpitation, chest discomfort or lower extremity swelling Gastrointestinal:  Denies nausea, heartburn or change in bowel habits (+) upper abdominal pain improving, mid abdominal pain similar to menstrual cramping Skin: Denies abnormal skin rashes Lymphatics: Denies new lymphadenopathy or easy bruising Neurological:Denies numbness, tingling or new weaknesses Behavioral/Psych: Mood is stable, no new changes  All other systems were reviewed with the patient and are negative.  PHYSICAL EXAMINATION: ECOG PERFORMANCE STATUS: 1 - Symptomatic but completely ambulatory LMP 05/18/2016 (Approximate)  GENERAL:alert, no distress and comfortable SKIN: skin color, texture, turgor are normal, no rashes or significant lesions EYES: normal, conjunctiva are pink and non-injected, sclera clear OROPHARYNX:no exudate, no erythema and lips, buccal mucosa, (+) oral thrush on her tongue NECK: supple, thyroid normal size, non-tender, without nodularity LYMPH:  no palpable lymphadenopathy in the cervical, axillary or inguinal LUNGS:  clear to auscultation and percussion with normal breathing effort HEART: regular rate & rhythm and no murmurs and no lower extremity edema ABDOMEN:abdomen soft,  non-tender and normal bowel sounds Musculoskeletal:no cyanosis of digits and no clubbing  PSYCH: alert & oriented x 3 with fluent speech NEURO: no focal motor/sensory deficits  LABORATORY DATA:  I have reviewed the data as listed CBC Latest Ref Rng & Units 09/13/2016 09/08/2016 09/06/2016  WBC 3.9 - 10.3 10e3/uL 6.1 13.4(H) 2.4(L)  Hemoglobin 11.6 - 15.9 g/dL 12.9 11.1(L) 10.6(L)  Hematocrit 34.8 - 46.6 % 37.8 32.6(L) 31.8(L)  Platelets 145 - 400 10e3/uL 361 491(H) 363   CMP Latest Ref Rng & Units 09/13/2016 09/08/2016 09/06/2016  Glucose 70 - 140 mg/dl 72 94 93  BUN 7.0 - 26.0 mg/dL 6.7(L) 8 4.5(L)  Creatinine 0.6 - 1.1 mg/dL 0.8 0.61 0.7  Sodium 136 - 145 mEq/L 136 137 138  Potassium 3.5 - 5.1 mEq/L 4.0 3.5 4.0  Chloride 101 - 111 mmol/L - 107 -  CO2 22 - 29 mEq/L 16(L) 20(L) 24  Calcium 8.4 - 10.4 mg/dL 9.2 9.4 8.9  Total Protein 6.4 - 8.3 g/dL 7.3 7.0 6.4  Total Bilirubin 0.20 - 1.20 mg/dL 0.32 0.6 0.23  Alkaline Phos 40 - 150 U/L 51 39 45  AST 5 - 34 U/L '23 31 24  '$ ALT 0 - 55 U/L '20 24 24   '$ ANC 0.7K TODAY   CA19.9 (0-35U/ml) 07/28/2016: 36 08/18/2016: 51  PATHOLOGY REPORT  Diagnosis 07/07/2016 FINE NEEDLE ASPIRATION, ENDOSCOPIC, LIVER (SPECIMEN 2 OF 2 COLLECTED 07/07/16): MALIGNANT CELLS CONSISTENT WITH ADENOCARCINOMA.  ADDITIONAL INFORMATION: Per request, immunohistochemistry was performed. The cells are positive for cytokeratin 7 and negative for cytokeratin 20 and CDX-2. This excludes a colorectal primary. Pancreaticobiliary and upper GI sources remain in the differential. Called to Dr. Burr Medico on 07/28/2016.  Diagnosis 07/07/2016 FINE NEEDLE ASPIRATION, ENDOSCOPIC, GASTRO-HEPATIC LYMPH NODE (SPECIMEN 1 OF 2 COLLECTED 07/07/16): MALIGNANT CELLS CONSISTENT WITH ADENOCARCINOMA, SEE COMMENT. Preliminary  Diagnosis Intraoperative Diagnosis: Few atypical glandular clusters - recommend additional material. 4) Adequate ([facility])  RADIOGRAPHIC STUDIES: I have personally reviewed the radiological images as listed and agreed with the findings in the report. Dg Chest 2 View  Result Date: 09/08/2016 CLINICAL DATA:  Palpitations for 3 hours. Burning sensation across the lower chest and epigastric area. History of cholangiocarcinoma on chemotherapy. EXAM: CHEST  2 VIEW COMPARISON:  05/27/2016 FINDINGS: Power port type central venous catheter with tip over the low SVC region. Normal heart size and pulmonary vascularity. No focal airspace disease or consolidation in the lungs. No blunting of costophrenic angles. No pneumothorax. Mediastinal contours appear intact. IMPRESSION: No active cardiopulmonary disease. Electronically Signed   By: Lucienne Capers M.D.   On: 09/08/2016 03:10   Upper EUS 07/07/2016 Dr. Ardis Hughs  - Endoscopically normal UGI tract (good views with standard gastroscope) - Vaguely bordered 4cm mass in the caudate lobe of liver that is confluent with what appear to be enlarged gastrohepatic lymphnodes (these may represent direct tumor extension however). Preliminary cytology from the lymphnodes shows malignancy, glandular. Preliminary cytology from the liver mass shows the same. Await final cytology results but this is most suspicious for peripheral intrahepatic cholangiocarcinoma with adjacent malignant adenopathy.   COLONOSCOPY 05/22/2014 ENDOSCOPIC IMPRESSION: 1. Sessile polyp measuring 4 mm in size was found in the sigmoid colon; polypectomy was performed with a cold snare 2. The colon mucosa was otherwise normal  ASSESSMENT & PLAN:  51 y.o. African-American female, without significant past medical history, presented with abdominal pain  1. Metastatic intrahepatic cholangiocarcinoma, with probable lung metastasis, cT2bN1M1, stage IV -I previously reviewed her CT, MRI, endoscopy findings  and her biopsy results with patient and her family members in details. -We have previously reviewed her case in our GI tumor board. Based on the scans and endoscopy findings, this is most consistent with cholangiocarcinoma. Her liver and inguinal biopsy showed adenocarcinoma, IHC was consistent with pancreatic-biliary primary -She has 2 additional small liver lesions and 2 4-69m lung lesions, which are indeterminate but that is suspicious for metastatic disease. -She was seen by surgeon Dr. BBarry Dieneswho thinks she is a not a candidate for surgical resection due to her metastatic disease. -We previously discussed her cancer is incurable at this stage, the goal of therapy is palliation, to prolong life and improve her quality life -she is on first-line cisplatin and gemcitabine, on day 1, 8 every 21 days.  -She tolerated first cycle chemotherapy moderately well, but had significant neutropenia and thrombocytopenia, cycle 2 chemotherapy was dose reduced -Due to a reaction to chemotherapy to week, she requests to postpone chemotherapy today to next week. We'll reschedule -lab reviewed with her, her cytopenia has resolved well  -She is reluctant to take Granix or neulasta, will reduce her gemcitabine dose next cycle   2. Abdominal pain -continue hydrocodone/acetaminophen every 4-6 hours as needed for pain control  -Constipation and management reviewed with patient  3. Nausea and anorexia  -We reviewed her nausea management, she will continue Compazine and Zofran -I encouraged her to take a nutritional supplement -We discussed appetite stimulant, she is on Marinol, appetite improved, will continue  -she will f/u with dietician   4. Neutropenia  -Secondary to chemotherapy -Resolved now  Plan -per pt's request, will hold her chemo today, and restart next Monday -I will see her back in one week   No orders of the defined types were placed in this encounter.   All questions were answered. The  patient knows to call the clinic with any problems, questions or concerns.  I spent 20 minutes counseling the patient face to face. The total time spent in the appointment was 25 minutes and more than 50% was on counseling.    FTruitt Merle MD 09/13/2016

## 2016-09-13 NOTE — Patient Instructions (Signed)
Dehydration, Adult Dehydration is a condition in which there is not enough fluid or water in the body. This happens when you lose more fluids than you take in. Important organs, such as the kidneys, brain, and heart, cannot function without a proper amount of fluids. Any loss of fluids from the body can lead to dehydration. Dehydration can range from mild to severe. This condition should be treated right away to prevent it from becoming severe. What are the causes? This condition may be caused by:  Vomiting.  Diarrhea.  Excessive sweating, such as from heat exposure or exercise.  Not drinking enough fluid, especially:  When ill.  While doing activity that requires a lot of energy.  Excessive urination.  Fever.  Infection.  Certain medicines, such as medicines that cause the body to lose excess fluid (diuretics).  Inability to access safe drinking water.  Reduced physical ability to get adequate water and food. What increases the risk? This condition is more likely to develop in people:  Who have a poorly controlled long-term (chronic) illness, such as diabetes, heart disease, or kidney disease.  Who are age 65 or older.  Who are disabled.  Who live in a place with high altitude.  Who play endurance sports. What are the signs or symptoms? Symptoms of mild dehydration may include:   Thirst.  Dry lips.  Slightly dry mouth.  Dry, warm skin.  Dizziness. Symptoms of moderate dehydration may include:   Very dry mouth.  Muscle cramps.  Dark urine. Urine may be the color of tea.  Decreased urine production.  Decreased tear production.  Heartbeat that is irregular or faster than normal (palpitations).  Headache.  Light-headedness, especially when you stand up from a sitting position.  Fainting (syncope). Symptoms of severe dehydration may include:   Changes in skin, such as:  Cold and clammy skin.  Blotchy (mottled) or pale skin.  Skin that does  not quickly return to normal after being lightly pinched and released (poor skin turgor).  Changes in body fluids, such as:  Extreme thirst.  No tear production.  Inability to sweat when body temperature is high, such as in hot weather.  Very little urine production.  Changes in vital signs, such as:  Weak pulse.  Pulse that is more than 100 beats a minute when sitting still.  Rapid breathing.  Low blood pressure.  Other changes, such as:  Sunken eyes.  Cold hands and feet.  Confusion.  Lack of energy (lethargy).  Difficulty waking up from sleep.  Short-term weight loss.  Unconsciousness. How is this diagnosed? This condition is diagnosed based on your symptoms and a physical exam. Blood and urine tests may be done to help confirm the diagnosis. How is this treated? Treatment for this condition depends on the severity. Mild or moderate dehydration can often be treated at home. Treatment should be started right away. Do not wait until dehydration becomes severe. Severe dehydration is an emergency and it needs to be treated in a hospital. Treatment for mild dehydration may include:   Drinking more fluids.  Replacing salts and minerals in your blood (electrolytes) that you may have lost. Treatment for moderate dehydration may include:   Drinking an oral rehydration solution (ORS). This is a drink that helps you replace fluids and electrolytes (rehydrate). It can be found at pharmacies and retail stores. Treatment for severe dehydration may include:   Receiving fluids through an IV tube.  Receiving an electrolyte solution through a feeding tube that is   passed through your nose and into your stomach (nasogastric tube, or NG tube).  Correcting any abnormalities in electrolytes.  Treating the underlying cause of dehydration. Follow these instructions at home:  If directed by your health care provider, drink an ORS:  Make an ORS by following instructions on the  package.  Start by drinking small amounts, about  cup (120 mL) every 5-10 minutes.  Slowly increase how much you drink until you have taken the amount recommended by your health care provider.  Drink enough clear fluid to keep your urine clear or pale yellow. If you were told to drink an ORS, finish the ORS first, then start slowly drinking other clear fluids. Drink fluids such as:  Water. Do not drink only water. Doing that can lead to having too little salt (sodium) in the body (hyponatremia).  Ice chips.  Fruit juice that you have added water to (diluted fruit juice).  Low-calorie sports drinks.  Avoid:  Alcohol.  Drinks that contain a lot of sugar. These include high-calorie sports drinks, fruit juice that is not diluted, and soda.  Caffeine.  Foods that are greasy or contain a lot of fat or sugar.  Take over-the-counter and prescription medicines only as told by your health care provider.  Do not take sodium tablets. This can lead to having too much sodium in the body (hypernatremia).  Eat foods that contain a healthy balance of electrolytes, such as bananas, oranges, potatoes, tomatoes, and spinach.  Keep all follow-up visits as told by your health care provider. This is important. Contact a health care provider if:  You have abdominal pain that:  Gets worse.  Stays in one area (localizes).  You have a rash.  You have a stiff neck.  You are more irritable than usual.  You are sleepier or more difficult to wake up than usual.  You feel weak or dizzy.  You feel very thirsty.  You have urinated only a small amount of very dark urine over 6-8 hours. Get help right away if:  You have symptoms of severe dehydration.  You cannot drink fluids without vomiting.  Your symptoms get worse with treatment.  You have a fever.  You have a severe headache.  You have vomiting or diarrhea that:  Gets worse.  Does not go away.  You have blood or green matter  (bile) in your vomit.  You have blood in your stool. This may cause stool to look black and tarry.  You have not urinated in 6-8 hours.  You faint.  Your heart rate while sitting still is over 100 beats a minute.  You have trouble breathing. This information is not intended to replace advice given to you by your health care provider. Make sure you discuss any questions you have with your health care provider. Document Released: 08/16/2005 Document Revised: 03/12/2016 Document Reviewed: 10/10/2015 Elsevier Interactive Patient Education  2017 [employer]  

## 2016-09-14 ENCOUNTER — Ambulatory Visit: Payer: Federal, State, Local not specified - PPO

## 2016-09-14 ENCOUNTER — Ambulatory Visit (INDEPENDENT_AMBULATORY_CARE_PROVIDER_SITE_OTHER): Payer: Federal, State, Local not specified - PPO | Admitting: Family Medicine

## 2016-09-14 DIAGNOSIS — H6122 Impacted cerumen, left ear: Secondary | ICD-10-CM | POA: Diagnosis not present

## 2016-09-14 LAB — CANCER ANTIGEN 19-9: CA 19-9: 56 U/mL — ABNORMAL HIGH (ref 0–35)

## 2016-09-14 NOTE — Patient Instructions (Signed)
Ear Irrigation Introduction  Purchase Colace, take a couple capsules, poke a whole in it, and squeeze liquid from capsule in ear. This will help soften the ear wax.  WHAT IS EAR IRRIGATION? Ear irrigation is a procedure to wash dirt and wax out of your ear canal. This procedure is also called lavage. You may need ear irrigation if you are having trouble hearing because of a buildup of earwax. You may also have ear irrigation as part of the treatment for an ear infection. Getting wax and dirt out of your ear canal can help some medicines given as ear drops work better. HOW IS EAR IRRIGATION PERFORMED? The procedure may vary among health care providers and hospitals. You may be given ear drops to put in your ear 15-20 minutes before irrigation. This helps loosen the wax. Then, a syringe containing water and a sterile salt solution (saline) can be gently inserted into the ear canal. The saline is used to flush out wax and other debris. Ear irrigation kits are also available to use at home. Ask your health care provider if this is an option for you. Use a home irrigation kit only as told by your health care provider. Read the package instructions carefully. Follow the directions for using the syringe. Use water that is room temperature. Do not do ear irrigation at home if you:  Have diabetes. Diabetes increases the risk of infection.  Have a hole or tear in your eardrum.  Have tubes in your ears. WHAT ARE THE RISKS OF EAR IRRIGATION? Generally, this is a safe procedure. However, problems may occur, including:  Infection.  Pain.  Hearing loss.  Pushing water and debris into the eardrum. This can occur if there are holes in the eardrum.  Ear irrigation failing to work. HOW SHOULD I CARE FOR MY EARS AFTER HAVING THEM IRRIGATED? Cleaning  Clean the outside of your ear with a soft washcloth daily.  If told by your health care provider, use a few drops of baby oil, mineral oil, glycerin,  hydrogen peroxide, or over-the-counter earwax softening drops.  Do not use cotton swabs to clean your ears. These can push wax down into the ear canal.  Do not put anything into your ears to try to remove wax. This includes ear candles. General Instructions  Take over-the-counter and prescription medicines only as told by your health care provider.  If you were prescribed an antibiotic medicine, use it as told by your health care provider. Do not stop using the antibiotic even if your condition improves.  Keep all follow-up visits as told by your health care provider. This is important.  Visit your health care provider at least once a year to have your ears and hearing checked. WHEN SHOULD I SEEK MEDICAL CARE? Seek medical care if:  Your hearing is not improving or is getting worse.  You have pain or redness in your ear.  You have fluid, blood, or pus coming out of your ear. This information is not intended to replace advice given to you by your health care provider. Make sure you discuss any questions you have with your health care provider. Document Released: 09/12/2015 Document Revised: 01/22/2016 Document Reviewed: 01/23/2015  2017 [employer]

## 2016-09-14 NOTE — Progress Notes (Signed)
Subjective:    Patient ID: Nancy [name], female    DOB: [birth year], 51 y.o.   MRN: [id number]  Chief Complaint  Patient presents with  . Acute Visit    pt c/o left pain for about 2 weeks.    HPI Patient is in today for left ear pain.  No other new complaints.  Ears feels clogged.  Past Medical History:  Diagnosis Date  . Allergy    SEASONAL  . Anemia    HIGH SCHOOL  . Atopic eczema   . Fibrocystic breast disease   . GERD (gastroesophageal reflux disease)   . Hx of migraines    seasonal   . Pneumonia    6 years ago   . TIA (transient ischemic attack)     Past Surgical History:  Procedure Laterality Date  . EUS N/A 07/07/2016   Procedure: UPPER ENDOSCOPIC ULTRASOUND (EUS) RADIAL;  Surgeon: [name], MD;  Location: [facility];  Service: Endoscopy;  Laterality: N/A;  . IR GENERIC HISTORICAL  08/03/2016   IR US GUIDE VASC ACCESS RIGHT 08/03/2016 Corrie Mckusick, DO [facility]  . IR GENERIC HISTORICAL  08/03/2016   IR FLUORO GUIDE PORT INSERTION RIGHT 08/03/2016 Corrie Mckusick, DO [facility]  . MYOMECTOMY  2002    Family History  Problem Relation Age of Onset  . Gout Mother   . Diabetes Mother   . Hypertension Father   . Diabetes Father   . Breast cancer Paternal Aunt   . Cancer Maternal Grandfather     throat cancer   . Cancer Cousin 61    GI cancer   . Thyroid disease Neg Hx     Social History   Social History  . Marital status: Married    Spouse name: N/A  . Number of children: 0  . Years of education: 40   Occupational History  . [employer]  .  [employer]   Social History Main Topics  . Smoking status: Never Smoker  . Smokeless tobacco: Never Used  . Alcohol use Yes     Comment: maybe 3 times a year  . Drug use: No  . Sexual activity: Yes    Partners: Male   Other Topics Concern  . Not on file   Social History Narrative  . No narrative on file    Outpatient Medications Prior to Visit  Medication Sig Dispense Refill  .  ALPRAZolam (XANAX) 0.5 MG tablet Take 1 tablet (0.5 mg total) by mouth as needed for anxiety. 10 tablet 0  . aspirin 81 MG tablet Take 81 mg by mouth daily.    Marland Kitchen CARAFATE 1 GM/10ML suspension TAKE 10 MLS (1 G TOTAL) BY MOUTH 4 (FOUR) TIMES DAILY - WITH MEALS AND AT BEDTIME. 420 mL 1  . cetirizine (ZYRTEC) 10 MG tablet Take 10 mg by mouth daily as needed. For allergies     . Cholecalciferol (VITAMIN D3) 5000 UNITS CAPS Take 1 capsule by mouth daily.    . Ciclopirox 1 % shampoo Use as directed 2x a week for up to 4 weeks 120 mL 1  . COLOSTRUM PO Take 1.25 g by mouth 2 (two) times daily. 1.25 grams twice daily    . dicyclomine (BENTYL) 20 MG tablet 3 (three) times daily as needed.     . dronabinol (MARINOL) 2.5 MG capsule Take 1 capsule (2.5 mg total) by mouth 2 (two) times daily before a meal. 30 capsule 1  . HYDROcodone-acetaminophen (NORCO) 5-325 MG  tablet Take 1-2 tablets by mouth every 6 (six) hours as needed for severe pain. 60 tablet 0  . lidocaine-prilocaine (EMLA) cream Apply 1 application topically as needed. Apply to portacath 1 1/2 hours - 2 hours prior to procedures as needed. 30 g 1  . meclizine (ANTIVERT) 50 MG tablet Take 1 tablet (50 mg total) by mouth 3 (three) times daily as needed. 30 tablet 0  . Menaquinone-7 (VITAMIN K2) 40 MCG TABS Take by mouth.    . mometasone (ELOCON) 0.1 % lotion Apply topically daily. 60 mL 0  . NON FORMULARY 1 capsule 1 day or 1 dose. Pregnelone, pt reports she takes daily.    . Nutritional Supplements (DHEA) 15-50 MG CAPS Take 1 capsule by mouth 3 (three) times a week.    . nystatin (MYCOSTATIN) 100000 UNIT/ML suspension Take 5 mLs (500,000 Units total) by mouth 4 (four) times daily. 60 mL 0  . omeprazole (PRILOSEC) 40 MG capsule Take 1 capsule (40 mg total) by mouth 2 (two) times daily before a meal. 60 capsule 11  . ondansetron (ZOFRAN) 8 MG tablet Take 1 tablet (8 mg total) by mouth every 8 (eight) hours as needed for nausea or vomiting. 20 tablet 1    . Prenatal Vit-Fe Fumarate-FA (PRENATAL MULTIVITAMIN) TABS tablet Take 2 tablets by mouth daily at 12 noon.    . sulfamethoxazole-trimethoprim (BACTRIM DS,SEPTRA DS) 800-160 MG tablet Take 1 tablet by mouth 2 (two) times daily.    Marland Kitchen triamcinolone cream (KENALOG) 0.1 % Apply topically.    Marland Kitchen VITAMIN K PO Take 1 capsule by mouth daily.     Facility-Administered Medications Prior to Visit  Medication Dose Route Frequency Provider Last Rate Last Dose  . alteplase (CATHFLO ACTIVASE) injection 2 mg  2 mg Intracatheter Once PRN Truitt Merle, MD      . heparin lock flush 100 unit/mL  250 Units Intracatheter Once PRN Truitt Merle, MD      . sodium chloride flush (NS) 0.9 % injection 10 mL  10 mL Intracatheter PRN Truitt Merle, MD   10 mL at 09/09/16 1645    Allergies  Allergen Reactions  . Penicillins Anaphylaxis    Has patient had a PCN reaction causing immediate rash, facial/tongue/throat swelling, SOB or lightheadedness with hypotensionYes Swelling in throat  Has patient had a PCN reaction causing severe rash involving mucus membranes or skin necrosis:/No Has patient had a PCN reaction that required hospitalization/No Has patient had a PCN reaction occurring within the last 10 years:NO If all of the above answers are "NO", then may proceed with Cephalosporin use.   . Lipitor [Atorvastatin] Palpitations    Tingling, flushing    Review of Systems  Constitutional: Negative.  Negative for fever.  HENT: Positive for ear pain. Negative for congestion.   Eyes: Negative.  Negative for blurred vision.  Respiratory: Negative.  Negative for cough.   Cardiovascular: Negative.  Negative for chest pain and palpitations.  Gastrointestinal: Negative.  Negative for vomiting.  Genitourinary: Negative.   Musculoskeletal: Negative.  Negative for back pain.  Skin: Negative.  Negative for rash.  Neurological: Negative.  Negative for loss of consciousness and headaches.  Endo/Heme/Allergies: Negative.    Psychiatric/Behavioral: Negative.        Objective:    Physical Exam  Constitutional: She is oriented to person, place, and time. She appears well-developed and well-nourished.  HENT:  Head: Normocephalic and atraumatic.  Left Ear: No lacerations. No drainage, swelling or tenderness. No foreign bodies. No mastoid tenderness.  Tympanic membrane is not injected, not scarred, not perforated, not erythematous, not retracted and not bulging. Tympanic membrane mobility is normal.  No middle ear effusion. No decreased hearing is noted.  Ears:  Eyes: Conjunctivae and EOM are normal.  Neck: Normal range of motion. Neck supple. No JVD present. Carotid bruit is not present. No thyromegaly present.  Cardiovascular: Normal rate, regular rhythm and normal heart sounds.   No murmur heard. Pulmonary/Chest: Effort normal and breath sounds normal. No respiratory distress. She has no wheezes. She has no rales. She exhibits no tenderness.  Musculoskeletal: She exhibits no edema.  Neurological: She is alert and oriented to person, place, and time.  Psychiatric: She has a normal mood and affect. Her behavior is normal. Judgment and thought content normal.  Nursing note and vitals reviewed.   BP 110/78 (BP Location: Right Arm, Patient Position: Sitting, Cuff Size: Normal)   Pulse (!) 102   Temp 97.5 F (36.4 C) (Oral)   Resp 16   Ht '5\' 6"'$  (1.676 m)   Wt 132 lb (59.9 kg)   SpO2 99%   BMI 21.31 kg/m  Wt Readings from Last 3 Encounters:  09/14/16 132 lb (59.9 kg)  09/08/16 140 lb (63.5 kg)  09/01/16 140 lb 4.8 oz (63.6 kg)     Lab Results  Component Value Date   WBC 6.1 09/13/2016   HGB 12.9 09/13/2016   HCT 37.8 09/13/2016   PLT 361 09/13/2016   GLUCOSE 72 09/13/2016   CHOL 144 03/16/2016   TRIG 48.0 03/16/2016   HDL 52.90 03/16/2016   LDLCALC 82 03/16/2016   ALT 20 09/13/2016   AST 23 09/13/2016   NA 136 09/13/2016   K 4.0 09/13/2016   CL 107 09/08/2016   CREATININE 0.8 09/13/2016    BUN 6.7 (L) 09/13/2016   CO2 16 (L) 09/13/2016   TSH 1.89 03/16/2016   INR 0.98 08/03/2016   HGBA1C 5.7 (H) 06/10/2015    Lab Results  Component Value Date   TSH 1.89 03/16/2016   Lab Results  Component Value Date   WBC 6.1 09/13/2016   HGB 12.9 09/13/2016   HCT 37.8 09/13/2016   MCV 83.9 09/13/2016   PLT 361 09/13/2016   Lab Results  Component Value Date   NA 136 09/13/2016   K 4.0 09/13/2016   CHLORIDE 103 09/13/2016   CO2 16 (L) 09/13/2016   GLUCOSE 72 09/13/2016   BUN 6.7 (L) 09/13/2016   CREATININE 0.8 09/13/2016   BILITOT 0.32 09/13/2016   ALKPHOS 51 09/13/2016   AST 23 09/13/2016   ALT 20 09/13/2016   PROT 7.3 09/13/2016   ALBUMIN 4.2 09/13/2016   CALCIUM 9.2 09/13/2016   ANIONGAP 17 (H) 09/13/2016   EGFR >90 09/13/2016   GFR 116.16 07/02/2016   Lab Results  Component Value Date   CHOL 144 03/16/2016   Lab Results  Component Value Date   HDL 52.90 03/16/2016   Lab Results  Component Value Date   LDLCALC 82 03/16/2016   Lab Results  Component Value Date   TRIG 48.0 03/16/2016   Lab Results  Component Value Date   CHOLHDL 3 03/16/2016   Lab Results  Component Value Date   HGBA1C 5.7 (H) 06/10/2015       Assessment & Plan:   Problem List Items Addressed This Visit      Unprioritized   Cerumen impaction    Irrigated successfully Tm and canal normal        There are  no diagnoses linked to this encounter.          I am having Ms. Nancie Neas maintain her cetirizine, Vitamin D3, VITAMIN K PO, prenatal multivitamin, aspirin, Ciclopirox, mometasone, meclizine, DHEA, NON FORMULARY, omeprazole, ALPRAZolam, CARAFATE, triamcinolone cream, Vitamin K2, COLOSTRUM PO, lidocaine-prilocaine, ondansetron, dicyclomine, sulfamethoxazole-trimethoprim, dronabinol, nystatin, and HYDROcodone-acetaminophen.  No orders of the defined types were placed in this encounter.   CMA served as Education administrator during this visit. History, Physical and Plan  performed by medical provider. Documentation and orders reviewed and attested to.   [name], DO

## 2016-09-14 NOTE — Progress Notes (Signed)
Pre visit review using our clinic review tool, if applicable. No additional management support is needed unless otherwise documented below in the visit note. 

## 2016-09-15 ENCOUNTER — Ambulatory Visit: Payer: Federal, State, Local not specified - PPO

## 2016-09-16 ENCOUNTER — Ambulatory Visit: Payer: Federal, State, Local not specified - PPO

## 2016-09-16 ENCOUNTER — Encounter: Payer: Self-pay | Admitting: Family Medicine

## 2016-09-16 DIAGNOSIS — H6120 Impacted cerumen, unspecified ear: Secondary | ICD-10-CM | POA: Insufficient documentation

## 2016-09-16 NOTE — Assessment & Plan Note (Signed)
Irrigated successfully Tm and canal normal

## 2016-09-17 ENCOUNTER — Ambulatory Visit: Payer: Federal, State, Local not specified - PPO

## 2016-09-20 ENCOUNTER — Telehealth: Payer: Self-pay | Admitting: Family Medicine

## 2016-09-20 ENCOUNTER — Ambulatory Visit: Payer: Federal, State, Local not specified - PPO | Admitting: Family Medicine

## 2016-09-20 NOTE — Telephone Encounter (Signed)
No charge. 

## 2016-09-20 NOTE — Telephone Encounter (Signed)
Patient states due to the weather last week patient chemo appointments had to be [facility] for this week and she's doing fine but wanted to inform PCP the reason she canceled her appointment for today, charge or no charge

## 2016-09-27 ENCOUNTER — Ambulatory Visit: Payer: Federal, State, Local not specified - PPO

## 2016-09-27 ENCOUNTER — Other Ambulatory Visit (HOSPITAL_BASED_OUTPATIENT_CLINIC_OR_DEPARTMENT_OTHER): Payer: Federal, State, Local not specified - PPO

## 2016-09-27 ENCOUNTER — Ambulatory Visit (HOSPITAL_BASED_OUTPATIENT_CLINIC_OR_DEPARTMENT_OTHER): Payer: Federal, State, Local not specified - PPO

## 2016-09-27 ENCOUNTER — Other Ambulatory Visit: Payer: Self-pay | Admitting: Hematology

## 2016-09-27 VITALS — BP 117/70 | HR 74 | Temp 98.7°F | Resp 18

## 2016-09-27 DIAGNOSIS — C7801 Secondary malignant neoplasm of right lung: Principal | ICD-10-CM

## 2016-09-27 DIAGNOSIS — Z5111 Encounter for antineoplastic chemotherapy: Secondary | ICD-10-CM

## 2016-09-27 DIAGNOSIS — C221 Intrahepatic bile duct carcinoma: Secondary | ICD-10-CM

## 2016-09-27 LAB — CBC WITH DIFFERENTIAL/PLATELET
BASO%: 1 % (ref 0.0–2.0)
Basophils Absolute: 0 10*3/uL (ref 0.0–0.1)
EOS%: 3.5 % (ref 0.0–7.0)
Eosinophils Absolute: 0.1 10*3/uL (ref 0.0–0.5)
HCT: 32.2 % — ABNORMAL LOW (ref 34.8–46.6)
HGB: 10.9 g/dL — ABNORMAL LOW (ref 11.6–15.9)
LYMPH%: 50.5 % — ABNORMAL HIGH (ref 14.0–49.7)
MCH: 28.9 pg (ref 25.1–34.0)
MCHC: 33.8 g/dL (ref 31.5–36.0)
MCV: 85.6 fL (ref 79.5–101.0)
MONO#: 0.2 10*3/uL (ref 0.1–0.9)
MONO%: 8.2 % (ref 0.0–14.0)
NEUT#: 1.1 10*3/uL — ABNORMAL LOW (ref 1.5–6.5)
NEUT%: 36.8 % — ABNORMAL LOW (ref 38.4–76.8)
Platelets: 168 10*3/uL (ref 145–400)
RBC: 3.76 10*6/uL (ref 3.70–5.45)
RDW: 16 % — ABNORMAL HIGH (ref 11.2–14.5)
WBC: 2.9 10*3/uL — ABNORMAL LOW (ref 3.9–10.3)
lymph#: 1.4 10*3/uL (ref 0.9–3.3)

## 2016-09-27 LAB — COMPREHENSIVE METABOLIC PANEL
ALT: 14 U/L (ref 0–55)
AST: 17 U/L (ref 5–34)
Albumin: 3.8 g/dL (ref 3.5–5.0)
Alkaline Phosphatase: 35 U/L — ABNORMAL LOW (ref 40–150)
Anion Gap: 9 mEq/L (ref 3–11)
BUN: 6.3 mg/dL — ABNORMAL LOW (ref 7.0–26.0)
CO2: 24 mEq/L (ref 22–29)
Calcium: 9.1 mg/dL (ref 8.4–10.4)
Chloride: 107 mEq/L (ref 98–109)
Creatinine: 0.7 mg/dL (ref 0.6–1.1)
EGFR: >90 mL/min/{1.73_m2} (ref >90)
Glucose: 87 mg/dl (ref 70–140)
Potassium: 3.9 mEq/L (ref 3.5–5.1)
Sodium: 141 mEq/L (ref 136–145)
Total Bilirubin: 0.5 mg/dL (ref 0.20–1.20)
Total Protein: 6.3 g/dL — ABNORMAL LOW (ref 6.4–8.3)

## 2016-09-27 MED ORDER — DEXTROSE-NACL 5-0.45 % IV SOLN
Freq: Once | INTRAVENOUS | Status: AC
  Administered 2016-09-27: 10:00:00 via INTRAVENOUS
  Filled 2016-09-27: qty 10

## 2016-09-27 MED ORDER — SODIUM CHLORIDE 0.9 % IV SOLN
Freq: Once | INTRAVENOUS | Status: AC
  Administered 2016-09-27: 10:00:00 via INTRAVENOUS

## 2016-09-27 MED ORDER — SODIUM CHLORIDE 0.9 % IV SOLN
Freq: Once | INTRAVENOUS | Status: AC
  Administered 2016-09-27: 12:00:00 via INTRAVENOUS
  Filled 2016-09-27: qty 5

## 2016-09-27 MED ORDER — SODIUM CHLORIDE 0.9% FLUSH
10.0000 mL | INTRAVENOUS | Status: DC | PRN
  Administered 2016-09-27: 10 mL
  Filled 2016-09-27: qty 10

## 2016-09-27 MED ORDER — CISPLATIN CHEMO INJECTION 100MG/100ML
25.0000 mg/m2 | Freq: Once | INTRAVENOUS | Status: AC
  Administered 2016-09-27: 43 mg via INTRAVENOUS
  Filled 2016-09-27: qty 43

## 2016-09-27 MED ORDER — HEPARIN SOD (PORK) LOCK FLUSH 100 UNIT/ML IV SOLN
500.0000 [IU] | Freq: Once | INTRAVENOUS | Status: AC | PRN
  Administered 2016-09-27: 500 [IU]
  Filled 2016-09-27: qty 5

## 2016-09-27 MED ORDER — SODIUM CHLORIDE 0.9 % IV SOLN
800.0000 mg/m2 | Freq: Once | INTRAVENOUS | Status: AC
  Administered 2016-09-27: 1368 mg via INTRAVENOUS
  Filled 2016-09-27: qty 35.98

## 2016-09-27 MED ORDER — PALONOSETRON HCL INJECTION 0.25 MG/5ML
INTRAVENOUS | Status: AC
  Filled 2016-09-27: qty 5

## 2016-09-27 MED ORDER — PALONOSETRON HCL INJECTION 0.25 MG/5ML
0.2500 mg | Freq: Once | INTRAVENOUS | Status: AC
  Administered 2016-09-27: 0.25 mg via INTRAVENOUS

## 2016-09-27 NOTE — Patient Instructions (Signed)
[facility] Discharge Instructions for Patients Receiving Chemotherapy  Today you received the following chemotherapy agents Gemzar and Cisplatin  To help prevent nausea and vomiting after your treatment, we encourage you to take your nausea medication as prescribed.   If you develop nausea and vomiting that is not controlled by your nausea medication, call the clinic.   BELOW ARE SYMPTOMS THAT SHOULD BE REPORTED IMMEDIATELY:  *FEVER GREATER THAN 100.5 F  *CHILLS WITH OR WITHOUT FEVER  NAUSEA AND VOMITING THAT IS NOT CONTROLLED WITH YOUR NAUSEA MEDICATION  *UNUSUAL SHORTNESS OF BREATH  *UNUSUAL BRUISING OR BLEEDING  TENDERNESS IN MOUTH AND THROAT WITH OR WITHOUT PRESENCE OF ULCERS  *URINARY PROBLEMS  *BOWEL PROBLEMS  UNUSUAL RASH Items with * indicate a potential emergency and should be followed up as soon as possible.  Feel free to call the clinic you have any questions or concerns. The clinic phone number is [phone].  Please show the [place] at check-in to the Emergency Department and triage nurse.  Cisplatin injection What is this medicine? CISPLATIN (SIS pla tin) is a chemotherapy drug. It targets fast dividing cells, like cancer cells, and causes these cells to die. This medicine is used to treat many types of cancer like bladder, ovarian, and testicular cancers. This medicine may be used for other purposes; ask your health care provider or pharmacist if you have questions. COMMON BRAND NAME(S): Platinol, Platinol -AQ What should I tell my health care provider before I take this medicine? They need to know if you have any of these conditions: -blood disorders -hearing problems -kidney disease -recent or ongoing radiation therapy -an unusual or allergic reaction to cisplatin, carboplatin, other chemotherapy, other medicines, foods, dyes, or preservatives -pregnant or trying to get pregnant -breast-feeding How should I use this  medicine? This drug is given as an infusion into a vein. It is administered in a hospital or clinic by a specially trained health care professional. Talk to your pediatrician regarding the use of this medicine in children. Special care may be needed. Overdosage: If you think you have taken too much of this medicine contact a poison control center or emergency room at once. NOTE: This medicine is only for you. Do not share this medicine with others. What if I miss a dose? It is important not to miss a dose. Call your doctor or health care professional if you are unable to keep an appointment. What may interact with this medicine? -dofetilide -foscarnet -medicines for seizures -medicines to increase blood counts like filgrastim, pegfilgrastim, sargramostim -probenecid -pyridoxine used with altretamine -rituximab -some antibiotics like amikacin, gentamicin, neomycin, polymyxin B, streptomycin, tobramycin -sulfinpyrazone -vaccines -zalcitabine Talk to your doctor or health care professional before taking any of these medicines: -acetaminophen -aspirin -ibuprofen -ketoprofen -naproxen This list may not describe all possible interactions. Give your health care provider a list of all the medicines, herbs, non-prescription drugs, or dietary supplements you use. Also tell them if you smoke, drink alcohol, or use illegal drugs. Some items may interact with your medicine. What should I watch for while using this medicine? Your condition will be monitored carefully while you are receiving this medicine. You will need important blood work done while you are taking this medicine. This drug may make you feel generally unwell. This is not uncommon, as chemotherapy can affect healthy cells as well as cancer cells. Report any side effects. Continue your course of treatment even though you feel ill unless your doctor tells you to  stop. In some cases, you may be given additional medicines to help with side  effects. Follow all directions for their use. Call your doctor or health care professional for advice if you get a fever, chills or sore throat, or other symptoms of a cold or flu. Do not treat yourself. This drug decreases your body's ability to fight infections. Try to avoid being around people who are sick. This medicine may increase your risk to bruise or bleed. Call your doctor or health care professional if you notice any unusual bleeding. Be careful brushing and flossing your teeth or using a toothpick because you may get an infection or bleed more easily. If you have any dental work done, tell your dentist you are receiving this medicine. Avoid taking products that contain aspirin, acetaminophen, ibuprofen, naproxen, or ketoprofen unless instructed by your doctor. These medicines may hide a fever. Do not become pregnant while taking this medicine. Women should inform their doctor if they wish to become pregnant or think they might be pregnant. There is a potential for serious side effects to an unborn child. Talk to your health care professional or pharmacist for more information. Do not breast-feed an infant while taking this medicine. Drink fluids as directed while you are taking this medicine. This will help protect your kidneys. Call your doctor or health care professional if you get diarrhea. Do not treat yourself. What side effects may I notice from receiving this medicine? Side effects that you should report to your doctor or health care professional as soon as possible: -allergic reactions like skin rash, itching or hives, swelling of the face, lips, or tongue -signs of infection - fever or chills, cough, sore throat, pain or difficulty passing urine -signs of decreased platelets or bleeding - bruising, pinpoint red spots on the skin, black, tarry stools, nosebleeds -signs of decreased red blood cells - unusually weak or tired, fainting spells, lightheadedness -breathing  problems -changes in hearing -gout pain -low blood counts - This drug may decrease the number of white blood cells, red blood cells and platelets. You may be at increased risk for infections and bleeding. -nausea and vomiting -pain, swelling, redness or irritation at the injection site -pain, tingling, numbness in the hands or feet -problems with balance, movement -trouble passing urine or change in the amount of urine Side effects that usually do not require medical attention (report to your doctor or health care professional if they continue or are bothersome): -changes in vision -loss of appetite -metallic taste in the mouth or changes in taste This list may not describe all possible side effects. Call your doctor for medical advice about side effects. You may report side effects to FDA at 1-800-FDA-1088. Where should I keep my medicine? This drug is given in a hospital or clinic and will not be stored at home. NOTE: This sheet is a summary. It may not cover all possible information. If you have questions about this medicine, talk to your doctor, pharmacist, or health care provider.  2017 Elsevier/Gold Standard (2007-11-21 14:40:54)  Gemcitabine injection What is this medicine? GEMCITABINE (jem SIT a been) is a chemotherapy drug. This medicine is used to treat many types of cancer like breast cancer, lung cancer, pancreatic cancer, and ovarian cancer. This medicine may be used for other purposes; ask your health care provider or pharmacist if you have questions. COMMON BRAND NAME(S): Gemzar What should I tell my health care provider before I take this medicine? They need to know if you have  any of these conditions: -blood disorders -infection -kidney disease -liver disease -recent or ongoing radiation therapy -an unusual or allergic reaction to gemcitabine, other chemotherapy, other medicines, foods, dyes, or preservatives -pregnant or trying to get pregnant -breast-feeding How  should I use this medicine? This drug is given as an infusion into a vein. It is administered in a hospital or clinic by a specially trained health care professional. Talk to your pediatrician regarding the use of this medicine in children. Special care may be needed. Overdosage: If you think you have taken too much of this medicine contact a poison control center or emergency room at once. NOTE: This medicine is only for you. Do not share this medicine with others. What if I miss a dose? It is important not to miss your dose. Call your doctor or health care professional if you are unable to keep an appointment. What may interact with this medicine? -medicines to increase blood counts like filgrastim, pegfilgrastim, sargramostim -some other chemotherapy drugs like cisplatin -vaccines Talk to your doctor or health care professional before taking any of these medicines: -acetaminophen -aspirin -ibuprofen -ketoprofen -naproxen This list may not describe all possible interactions. Give your health care provider a list of all the medicines, herbs, non-prescription drugs, or dietary supplements you use. Also tell them if you smoke, drink alcohol, or use illegal drugs. Some items may interact with your medicine. What should I watch for while using this medicine? Visit your doctor for checks on your progress. This drug may make you feel generally unwell. This is not uncommon, as chemotherapy can affect healthy cells as well as cancer cells. Report any side effects. Continue your course of treatment even though you feel ill unless your doctor tells you to stop. In some cases, you may be given additional medicines to help with side effects. Follow all directions for their use. Call your doctor or health care professional for advice if you get a fever, chills or sore throat, or other symptoms of a cold or flu. Do not treat yourself. This drug decreases your body's ability to fight infections. Try to avoid  being around people who are sick. This medicine may increase your risk to bruise or bleed. Call your doctor or health care professional if you notice any unusual bleeding. Be careful brushing and flossing your teeth or using a toothpick because you may get an infection or bleed more easily. If you have any dental work done, tell your dentist you are receiving this medicine. Avoid taking products that contain aspirin, acetaminophen, ibuprofen, naproxen, or ketoprofen unless instructed by your doctor. These medicines may hide a fever. Women should inform their doctor if they wish to become pregnant or think they might be pregnant. There is a potential for serious side effects to an unborn child. Talk to your health care professional or pharmacist for more information. Do not breast-feed an infant while taking this medicine. What side effects may I notice from receiving this medicine? Side effects that you should report to your doctor or health care professional as soon as possible: -allergic reactions like skin rash, itching or hives, swelling of the face, lips, or tongue -low blood counts - this medicine may decrease the number of white blood cells, red blood cells and platelets. You may be at increased risk for infections and bleeding. -signs of infection - fever or chills, cough, sore throat, pain or difficulty passing urine -signs of decreased platelets or bleeding - bruising, pinpoint red spots on the skin, black,  tarry stools, blood in the urine -signs of decreased red blood cells - unusually weak or tired, fainting spells, lightheadedness -breathing problems -chest pain -mouth sores -nausea and vomiting -pain, swelling, redness at site where injected -pain, tingling, numbness in the hands or feet -stomach pain -swelling of ankles, feet, hands -unusual bleeding Side effects that usually do not require medical attention (report to your doctor or health care professional if they continue or  are bothersome): -constipation -diarrhea -hair loss -loss of appetite -stomach upset This list may not describe all possible side effects. Call your doctor for medical advice about side effects. You may report side effects to FDA at 1-800-FDA-1088. Where should I keep my medicine? This drug is given in a hospital or clinic and will not be stored at home. NOTE: This sheet is a summary. It may not cover all possible information. If you have questions about this medicine, talk to your doctor, pharmacist, or health care provider.  2017 Elsevier/Gold Standard (2007-12-26 18:45:54)

## 2016-09-27 NOTE — Progress Notes (Signed)
Per Dr. Burr Medico, [place]  to treat with ANC of 1.1. Also reported that patient is feeling "a little sick". VS stable, no fever/chills. OK to receive chemotherapy today.   Wylene Simmer, BSN, RN 09/27/2016 9:26 AM

## 2016-09-27 NOTE — Patient Instructions (Signed)

## 2016-10-01 ENCOUNTER — Telehealth: Payer: Self-pay | Admitting: *Deleted

## 2016-10-01 NOTE — Telephone Encounter (Signed)
Husband called to report that pt had abdominal pain again yesterday.  Took Norco with some relief.  Pt also had increased lack of appetite - taking Marinol as prescribed with not much help.  Denied nausea/vomiting , stated bowel seemed to be functioned ok since pt did not have much food intake.   Dr. Burr Medico notified.  Called pt and informed  pt re:  Per Dr. Burr Medico, above symptoms could be chemo related.   Instructed pt to take Norco as prescribed for pain.  Increased Marinol to 5 mg  Twice daily for appetite stimulant.  Reinforce need to increase po fluids intake as tolerated -  Dilute Boost and/or Ensure to lessen sweet taste, drink [employer] when not eating solid foods.  Instructed pt to call MD on call if symptoms worsened during night and weekend.  Pt understood to go to ER if she feels dehydrated.

## 2016-10-01 NOTE — Progress Notes (Signed)
[place]  [phone] [phone]  Clinic Follow Up Note   Patient Care Team: [name], DO as PCP - General Jerene Bears, MD as Consulting Physician (Gastroenterology) 10/04/2016  CHIEF COMPLAINTS:  Follow up metastatic intrahepatic cholangiocarcinoma  Oncology History   Metastatic cholangiocarcinoma to lung [facility])   Staging form: Perihilar Bile Ducts, AJCC 7th Edition   - Clinical stage from 07/07/2016: Stage IVB (T2b, N1, M1) - Signed by Truitt Merle, MD on 07/28/2016      Metastatic cholangiocarcinoma to lung ([place])   05/22/2014 Procedure    Routine screening colonoscopy showed a sessile polyp measuring 4 mm in the sigmoid colon, removed, otherwise negative.      06/29/2016 Imaging    CT chest, abdomen and pelvis showed 2 indeterminant nodule in left and the right lung, 4-28m, indeterminate a hypovascular liver lesions largest in the caudate lobe measuring 2.6 cm, mild abdominal lymphadenopathy in the gastrohepatic ligament and portocaval space.      07/02/2016 Imaging    Abdominal MRI with and without contrast showed 2 lobular lesions in the caudate lobe, and is regular lymph nodes in the gastrohepatic ligament which is adjacent to the liver lesion, suspicious for malignancy. 2 small lesions in the right hepatic lobe are indeterminate, but concerning for metastasis.      07/07/2016 Procedure    EGD was negative, EUS biopsy of the liver and adjacent lymph node      07/07/2016 Initial Diagnosis    Metastatic cholangiocarcinoma to lung ([facility]      07/07/2016 Initial Biopsy    Fine-needle aspiration of the liver lesion in caudate lobe and adjacent lymph nodes both showed metastatic adenocarcinoma.      08/11/2016 -  Chemotherapy    Cisplatin 25 mg/m, gemcitabine 1000 mg/m, on day one and 8, every 21 days         HISTORY OF PRESENTING ILLNESS:  Nancy Berkshire51y.o. female is here because of Her recently diagnosed  metastatic glandular carcinoma. She is accompanied by her husband and mother to my clinic today.  She has been having RUQ abdominal pain since mid 04/2016, she was seen by PCP and was treated for gas with GI cocktail and pepcide, which she did not help much. Her pain got worse, and radiates to right shoulder, she denies significant nausea, or bloating, BM normal, no fever, cough or dyspnea. She recently noticed mild chest discomfort in the low sternum area. She initially had the lab, ultrasound done by her primary care physician, which was unrevealing. She was referred to GI Dr. PHilarie Fredricksonand underwent EGD and CT scan, which showed a lobulated mass in the caudate lobe of liver, with at adjacent adenopathy. He underwent EUS and fine-needle biopsy of the liver mass and lymph nodes, all reviewed adenocarcinoma.  She has lost about 7lbs in the past 3 months. She has mild fatigue, but able to function at home. She has stopped working due to her abdominal pain and fatigue. She takes Norco every 1-3 times a day, and her pain seems not well controlled.  CURRENT THERAPY: Cisplatin and gemcitabine on Day 1, 8 every 21 days  INTERIM HISTORY: Nancy [name] for follow up and cycle 3 day 8 chemo. She is doing better today than she was doing yesterday. As of Tuesday last week, she began taking Marinol to ensure she was eating. On Friday she called our clinic and was advised to take two Marinol daily. She began doing so yesterday  which has helped with her appetite and ability to eat. She has been drinking vegetable and fruit juices. Last week she had an episode of sadness related to losing so much weight. She has met with our dietician. She does not like the Boost or Ensure because it is so sweet. She reports the Marinol helps with her nausea, as well.   She is able to wear a bra again without as much pressure over her abdomen. She has not been taking as much pain medication. Overall her abdominal pain is improving.    MEDICAL HISTORY:  Past Medical History:  Diagnosis Date  . Allergy    SEASONAL  . Anemia    HIGH SCHOOL  . Atopic eczema   . Fibrocystic breast disease   . GERD (gastroesophageal reflux disease)   . Hx of migraines    seasonal   . Pneumonia    6 years ago   . TIA (transient ischemic attack)     SURGICAL HISTORY: Past Surgical History:  Procedure Laterality Date  . EUS N/A 07/07/2016   Procedure: UPPER ENDOSCOPIC ULTRASOUND (EUS) RADIAL;  Surgeon: Rachael Fee, MD;  Location: [facility];  Service: Endoscopy;  Laterality: N/A;  . IR GENERIC HISTORICAL  08/03/2016   IR US GUIDE VASC ACCESS RIGHT 08/03/2016 Gilmer Mor, DO [facility]  . IR GENERIC HISTORICAL  08/03/2016   IR FLUORO GUIDE PORT INSERTION RIGHT 08/03/2016 Gilmer Mor, DO [facility]  . MYOMECTOMY  2002    SOCIAL HISTORY: Social History   Social History  . Marital status: Married    Spouse name: N/A  . Number of children: 0  . Years of education: 72   Occupational History  . [employer]  .  [employer]   Social History Main Topics  . Smoking status: Never Smoker  . Smokeless tobacco: Never Used  . Alcohol use Yes     Comment: maybe 3 times a year  . Drug use: No  . Sexual activity: Yes    Partners: Male   Other Topics Concern  . Not on file   Social History Narrative  . No narrative on file    FAMILY HISTORY: Family History  Problem Relation Age of Onset  . Gout Mother   . Diabetes Mother   . Hypertension Father   . Diabetes Father   . Breast cancer Paternal Aunt   . Cancer Maternal Grandfather     throat cancer   . Cancer Cousin 37    GI cancer   . Thyroid disease Neg Hx     ALLERGIES:  is allergic to penicillins and lipitor [atorvastatin].  MEDICATIONS:  Current Outpatient Prescriptions  Medication Sig Dispense Refill  . ALPRAZolam (XANAX) 0.5 MG tablet Take 1 tablet (0.5 mg total) by mouth as needed for anxiety. 10 tablet 0  . aspirin 81 MG tablet Take 81 mg by  mouth daily.    Marland Kitchen CARAFATE 1 GM/10ML suspension TAKE 10 MLS (1 G TOTAL) BY MOUTH 4 (FOUR) TIMES DAILY - WITH MEALS AND AT BEDTIME. 420 mL 1  . cetirizine (ZYRTEC) 10 MG tablet Take 10 mg by mouth daily as needed. For allergies     . Cholecalciferol (VITAMIN D3) 5000 UNITS CAPS Take 1 capsule by mouth daily.    . Ciclopirox 1 % shampoo Use as directed 2x a week for up to 4 weeks 120 mL 1  . COLOSTRUM PO Take 1.25 g by mouth 2 (two) times daily. 1.25 grams twice  daily    . dicyclomine (BENTYL) 20 MG tablet 3 (three) times daily as needed.     . dronabinol (MARINOL) 2.5 MG capsule Take 1 capsule (2.5 mg total) by mouth 2 (two) times daily before a meal. 30 capsule 1  . dronabinol (MARINOL) 5 MG capsule Take 1 capsule (5 mg total) by mouth 2 (two) times daily before a meal. 60 capsule 1  . HYDROcodone-acetaminophen (NORCO) 5-325 MG tablet Take 1-2 tablets by mouth every 6 (six) hours as needed for severe pain. 60 tablet 0  . lidocaine-prilocaine (EMLA) cream Apply 1 application topically as needed. Apply to portacath 1 1/2 hours - 2 hours prior to procedures as needed. 30 g 1  . meclizine (ANTIVERT) 50 MG tablet Take 1 tablet (50 mg total) by mouth 3 (three) times daily as needed. 30 tablet 0  . Menaquinone-7 (VITAMIN K2) 40 MCG TABS Take by mouth.    . mometasone (ELOCON) 0.1 % lotion Apply topically daily. 60 mL 0  . NON FORMULARY 1 capsule 1 day or 1 dose. Pregnelone, pt reports she takes daily.    . Nutritional Supplements (DHEA) 15-50 MG CAPS Take 1 capsule by mouth 3 (three) times a week.    . nystatin (MYCOSTATIN) 100000 UNIT/ML suspension Take 5 mLs (500,000 Units total) by mouth 4 (four) times daily. 60 mL 0  . omeprazole (PRILOSEC) 40 MG capsule Take 1 capsule (40 mg total) by mouth 2 (two) times daily before a meal. 60 capsule 11  . ondansetron (ZOFRAN) 8 MG tablet Take 1 tablet (8 mg total) by mouth every 8 (eight) hours as needed for nausea or vomiting. 20 tablet 1  . Prenatal Vit-Fe  Fumarate-FA (PRENATAL MULTIVITAMIN) TABS tablet Take 2 tablets by mouth daily at 12 noon.    . sulfamethoxazole-trimethoprim (BACTRIM DS,SEPTRA DS) 800-160 MG tablet Take 1 tablet by mouth 2 (two) times daily.    Marland Kitchen triamcinolone cream (KENALOG) 0.1 % Apply topically.    Marland Kitchen VITAMIN K PO Take 1 capsule by mouth daily.     No current facility-administered medications for this visit.    Facility-Administered Medications Ordered in Other Visits  Medication Dose Route Frequency Provider Last Rate Last Dose  . 0.9 %  sodium chloride infusion   Intravenous Once Malachy Mood, MD      . alteplase (CATHFLO ACTIVASE) injection 2 mg  2 mg Intracatheter Once PRN Malachy Mood, MD      . dextrose 5 % and 0.45% NaCl 1,000 mL with potassium chloride 20 mEq, magnesium sulfate 12 mEq infusion   Intravenous Once Malachy Mood, MD      . heparin lock flush 100 unit/mL  250 Units Intracatheter Once PRN Malachy Mood, MD      . sodium chloride flush (NS) 0.9 % injection 10 mL  10 mL Intracatheter PRN Malachy Mood, MD   10 mL at 09/09/16 1645    REVIEW OF SYSTEMS:   Constitutional: Denies fevers, chills or abnormal night sweats (+) weight loss  Eyes: Denies blurriness of vision, double vision or watery eyes Ears, nose, mouth, throat, and face: Denies mucositis or sore throat Respiratory: Denies cough, dyspnea or wheezes Cardiovascular: Denies palpitation, chest discomfort or lower extremity swelling Gastrointestinal:  Denies heartburn or change in bowel habits (+) upper abdominal pain improving (+) nausea managed with medication Skin: Denies abnormal skin rashes Lymphatics: Denies new lymphadenopathy or easy bruising Neurological:Denies numbness, tingling or new weaknesses Behavioral/Psych: Mood is stable, no new changes  All other systems were  reviewed with the patient and are negative.  PHYSICAL EXAMINATION: ECOG PERFORMANCE STATUS: 1 - Symptomatic but completely ambulatory BP 111/60 (BP Location: Left Arm, Patient Position:  Sitting)   Pulse 85   Temp 98.6 F (37 C) (Oral)   Resp 18   Ht '5\' 6"'$  (1.676 m)   Wt 131 lb 8 oz (59.6 kg)   SpO2 100%   BMI 21.22 kg/m  GENERAL:alert, no distress and comfortable SKIN: skin color, texture, turgor are normal, no rashes or significant lesions EYES: normal, conjunctiva are pink and non-injected, sclera clear OROPHARYNX:no exudate, no erythema and lips, buccal mucosa NECK: supple, thyroid normal size, non-tender, without nodularity LYMPH:  no palpable lymphadenopathy in the cervical, axillary or inguinal LUNGS: clear to auscultation and percussion with normal breathing effort HEART: regular rate & rhythm and no murmurs and no lower extremity edema ABDOMEN:abdomen soft, non-tender and normal bowel sounds Musculoskeletal:no cyanosis of digits and no clubbing  PSYCH: alert & oriented x 3 with fluent speech NEURO: no focal motor/sensory deficits  LABORATORY DATA:  I have reviewed the data as listed CBC Latest Ref Rng & Units 10/04/2016 09/27/2016 09/13/2016  WBC 3.9 - 10.3 10e3/uL 3.0(L) 2.9(L) 6.1  Hemoglobin 11.6 - 15.9 g/dL 11.0(L) 10.9(L) 12.9  Hematocrit 34.8 - 46.6 % 32.6(L) 32.2(L) 37.8  Platelets 145 - 400 10e3/uL 108(L) 168 361   CMP Latest Ref Rng & Units 10/04/2016 09/27/2016 09/13/2016  Glucose 70 - 140 mg/dl 98 87 72  BUN 7.0 - 26.0 mg/dL 6.7(L) 6.3(L) 6.7(L)  Creatinine 0.6 - 1.1 mg/dL 0.7 0.7 0.8  Sodium 136 - 145 mEq/L 140 141 136  Potassium 3.5 - 5.1 mEq/L 4.2 3.9 4.0  Chloride 101 - 111 mmol/L - - -  CO2 22 - 29 mEq/L 25 24 16(L)  Calcium 8.4 - 10.4 mg/dL 9.6 9.1 9.2  Total Protein 6.4 - 8.3 g/dL 7.0 6.3(L) 7.3  Total Bilirubin 0.20 - 1.20 mg/dL 0.28 0.50 0.32  Alkaline Phos 40 - 150 U/L 41 35(L) 51  AST 5 - 34 U/L '17 17 23  '$ ALT 0 - 55 U/L '21 14 20   '$ Neut# 1.0K TODAY   CA19.9 (0-35U/ml) 07/28/2016: 36 08/18/2016: 51 09/13/2016: 56  PATHOLOGY REPORT  Diagnosis 07/07/2016 FINE NEEDLE ASPIRATION, ENDOSCOPIC, LIVER (SPECIMEN 2 OF 2 COLLECTED  07/07/16): MALIGNANT CELLS CONSISTENT WITH ADENOCARCINOMA.  ADDITIONAL INFORMATION: Per request, immunohistochemistry was performed. The cells are positive for cytokeratin 7 and negative for cytokeratin 20 and CDX-2. This excludes a colorectal primary. Pancreaticobiliary and upper GI sources remain in the differential. Called to Dr. Burr Medico on 07/28/2016.  Diagnosis 07/07/2016 FINE NEEDLE ASPIRATION, ENDOSCOPIC, GASTRO-HEPATIC LYMPH NODE (SPECIMEN 1 OF 2 COLLECTED 07/07/16): MALIGNANT CELLS CONSISTENT WITH ADENOCARCINOMA, SEE COMMENT. Preliminary Diagnosis Intraoperative Diagnosis: Few atypical glandular clusters - recommend additional material. 4) Adequate ([facility])  RADIOGRAPHIC STUDIES: I have personally reviewed the radiological images as listed and agreed with the findings in the report. Dg Chest 2 View  Result Date: 09/08/2016 CLINICAL DATA:  Palpitations for 3 hours. Burning sensation across the lower chest and epigastric area. History of cholangiocarcinoma on chemotherapy. EXAM: CHEST  2 VIEW COMPARISON:  05/27/2016 FINDINGS: Power port type central venous catheter with tip over the low SVC region. Normal heart size and pulmonary vascularity. No focal airspace disease or consolidation in the lungs. No blunting of costophrenic angles. No pneumothorax. Mediastinal contours appear intact. IMPRESSION: No active cardiopulmonary disease. Electronically Signed   By: Lucienne Capers M.D.   On: 09/08/2016 03:10   Upper  EUS 07/07/2016 Dr. Ardis Hughs  - Endoscopically normal UGI tract (good views with standard gastroscope) - Vaguely bordered 4cm mass in the caudate lobe of liver that is confluent with what appear to be enlarged gastrohepatic lymphnodes (these may represent direct tumor extension however). Preliminary cytology from the lymphnodes shows malignancy, glandular. Preliminary cytology from the liver mass shows the same. Await final cytology results but this is most suspicious for peripheral  intrahepatic cholangiocarcinoma with adjacent malignant adenopathy.   COLONOSCOPY 05/22/2014 ENDOSCOPIC IMPRESSION: 1. Sessile polyp measuring 4 mm in size was found in the sigmoid colon; polypectomy was performed with a cold snare 2. The colon mucosa was otherwise normal  ASSESSMENT & PLAN:  51 y.o. African-American female, without significant past medical history, presented with abdominal pain  1. Metastatic intrahepatic cholangiocarcinoma, with probable lung metastasis, cT2bN1M1, stage IV -I previously reviewed her CT, MRI, endoscopy findings and her biopsy results with patient and her family members in details. -We have previously reviewed her case in our GI tumor board. Based on the scans and endoscopy findings, this is most consistent with cholangiocarcinoma. Her liver and inguinal biopsy showed adenocarcinoma, IHC was consistent with pancreatic-biliary primary -She has 2 additional small liver lesions and 2 4-47m lung lesions, which are indeterminate but that is suspicious for metastatic disease. -She was seen by surgeon Dr. BBarry Dieneswho thinks she is a not a candidate for surgical resection due to her metastatic disease. -We previously discussed her cancer is incurable at this stage, the goal of therapy is palliation, to prolong life and improve her quality life -she is on first-line cisplatin and gemcitabine, on day 1, 8 every 21 days.  -She has been tolerating chemotherapy moderately well, but had significant neutropenia and thrombocytopenia, cycle 2 chemotherapy was dose reduced -lab reviewed with her, mild neutropenia and thrombocytopenia, OK to treat, we'll proceed cycle for chemotherapy today -She would prefer to receive neulasta versus onpro -We'll repeat staging CT scan after today's treatment  2. Abdominal pain, improving -continue hydrocodone/acetaminophen every 4-6 hours as needed for pain control  -Constipation and management reviewed with patient  3. Nausea and anorexia  and weight loss  -We reviewed her nausea management, she will continue Compazine and Zofran -I encouraged her to take a nutritional supplement -We discussed appetite stimulant, she is on Marinol, dose was increased to 5 mg twice daily last week, appetite improved, will continue.  -she will f/u with dietician  -Encouraged her to have a high calorie, high protein diet.  4. Neutropenia  -Secondary to chemotherapy -Continue monitoring  5. Goal of care discussion  -We again discussed the incurable nature of her cancer, and the overall poor prognosis, especially if she does not have good response to chemotherapy or progress on chemo -The patient understands the goal of care is palliative. -I recommend DNR/DNI, she will think about it   Plan -Cisplatin / Gemcitabine Cycle #3, Day 8 today. She will return for neulasta tomorrow. -I have refilled her Marinol today.  -Repeat CT chest, abdomen, pelvis in the next week or two. -I will see her back in two weeks for follow up post-scans to discuss these results and continue chemo if no PD    Orders Placed This Encounter  Procedures  . CT Abdomen Pelvis W Contrast    Standing Status:   Future    Standing Expiration Date:   10/04/2017    Order Specific Question:   If indicated for the ordered procedure, I authorize the administration of contrast media per Radiology  protocol    Answer:   Yes    Order Specific Question:   Reason for Exam (SYMPTOM  OR DIAGNOSIS REQUIRED)    Answer:   restaging, pt on chemo    Order Specific Question:   Is patient pregnant?    Answer:   No    Order Specific Question:   Preferred imaging location?    Answer:   [facility]  . CT Chest W Contrast    Standing Status:   Future    Standing Expiration Date:   10/04/2017    Order Specific Question:   If indicated for the ordered procedure, I authorize the administration of contrast media per Radiology protocol    Answer:   Yes    Order Specific Question:   Reason  for Exam (SYMPTOM  OR DIAGNOSIS REQUIRED)    Answer:   restaging    Order Specific Question:   Is patient pregnant?    Answer:   No    Order Specific Question:   Preferred imaging location?    Answer:   [facility]    All questions were answered. The patient knows to call the clinic with any problems, questions or concerns.  I spent 20 minutes counseling the patient face to face. The total time spent in the appointment was 25 minutes and more than 50% was on counseling.  This document serves as a record of services personally performed by Truitt Merle, MD. It was created on her behalf by Arlyce Harman, a trained medical scribe. The creation of this record is based on the scribe's personal observations and the provider's statements to them. This document has been checked and approved by the attending provider.    Truitt Merle, MD 10/04/2016

## 2016-10-04 ENCOUNTER — Encounter: Payer: Self-pay | Admitting: Hematology

## 2016-10-04 ENCOUNTER — Other Ambulatory Visit (HOSPITAL_BASED_OUTPATIENT_CLINIC_OR_DEPARTMENT_OTHER): Payer: Federal, State, Local not specified - PPO

## 2016-10-04 ENCOUNTER — Telehealth: Payer: Self-pay | Admitting: *Deleted

## 2016-10-04 ENCOUNTER — Ambulatory Visit (HOSPITAL_BASED_OUTPATIENT_CLINIC_OR_DEPARTMENT_OTHER): Payer: Federal, State, Local not specified - PPO

## 2016-10-04 ENCOUNTER — Ambulatory Visit: Payer: Federal, State, Local not specified - PPO

## 2016-10-04 ENCOUNTER — Ambulatory Visit (HOSPITAL_BASED_OUTPATIENT_CLINIC_OR_DEPARTMENT_OTHER): Payer: Federal, State, Local not specified - PPO | Admitting: Hematology

## 2016-10-04 VITALS — BP 111/60 | HR 85 | Temp 98.6°F | Resp 18 | Ht 66.0 in | Wt 131.5 lb

## 2016-10-04 DIAGNOSIS — D701 Agranulocytosis secondary to cancer chemotherapy: Secondary | ICD-10-CM

## 2016-10-04 DIAGNOSIS — R109 Unspecified abdominal pain: Secondary | ICD-10-CM

## 2016-10-04 DIAGNOSIS — C7801 Secondary malignant neoplasm of right lung: Secondary | ICD-10-CM

## 2016-10-04 DIAGNOSIS — Z5111 Encounter for antineoplastic chemotherapy: Secondary | ICD-10-CM

## 2016-10-04 DIAGNOSIS — C221 Intrahepatic bile duct carcinoma: Secondary | ICD-10-CM

## 2016-10-04 DIAGNOSIS — R630 Anorexia: Secondary | ICD-10-CM

## 2016-10-04 DIAGNOSIS — R110 Nausea: Secondary | ICD-10-CM

## 2016-10-04 DIAGNOSIS — D6959 Other secondary thrombocytopenia: Secondary | ICD-10-CM

## 2016-10-04 DIAGNOSIS — Z7189 Other specified counseling: Secondary | ICD-10-CM | POA: Insufficient documentation

## 2016-10-04 DIAGNOSIS — R634 Abnormal weight loss: Secondary | ICD-10-CM

## 2016-10-04 LAB — CBC WITH DIFFERENTIAL/PLATELET
BASO%: 0.7 % (ref 0.0–2.0)
Basophils Absolute: 0 10*3/uL (ref 0.0–0.1)
EOS%: 1 % (ref 0.0–7.0)
Eosinophils Absolute: 0 10*3/uL (ref 0.0–0.5)
HCT: 32.6 % — ABNORMAL LOW (ref 34.8–46.6)
HGB: 11 g/dL — ABNORMAL LOW (ref 11.6–15.9)
LYMPH%: 61.3 % — ABNORMAL HIGH (ref 14.0–49.7)
MCH: 28.9 pg (ref 25.1–34.0)
MCHC: 33.7 g/dL (ref 31.5–36.0)
MCV: 85.6 fL (ref 79.5–101.0)
MONO#: 0.1 10*3/uL (ref 0.1–0.9)
MONO%: 2 % (ref 0.0–14.0)
NEUT#: 1 10*3/uL — ABNORMAL LOW (ref 1.5–6.5)
NEUT%: 35 % — ABNORMAL LOW (ref 38.4–76.8)
Platelets: 108 10*3/uL — ABNORMAL LOW (ref 145–400)
RBC: 3.81 10*6/uL (ref 3.70–5.45)
RDW: 14.9 % — ABNORMAL HIGH (ref 11.2–14.5)
WBC: 3 10*3/uL — ABNORMAL LOW (ref 3.9–10.3)
lymph#: 1.8 10*3/uL (ref 0.9–3.3)
nRBC: 0 % (ref 0–0)

## 2016-10-04 LAB — COMPREHENSIVE METABOLIC PANEL
ALT: 21 U/L (ref 0–55)
AST: 17 U/L (ref 5–34)
Albumin: 4.1 g/dL (ref 3.5–5.0)
Alkaline Phosphatase: 41 U/L (ref 40–150)
Anion Gap: 9 mEq/L (ref 3–11)
BUN: 6.7 mg/dL — ABNORMAL LOW (ref 7.0–26.0)
CO2: 25 mEq/L (ref 22–29)
Calcium: 9.6 mg/dL (ref 8.4–10.4)
Chloride: 105 mEq/L (ref 98–109)
Creatinine: 0.7 mg/dL (ref 0.6–1.1)
EGFR: >90 mL/min/{1.73_m2} (ref >90)
Glucose: 98 mg/dl (ref 70–140)
Potassium: 4.2 mEq/L (ref 3.5–5.1)
Sodium: 140 mEq/L (ref 136–145)
Total Bilirubin: 0.28 mg/dL (ref 0.20–1.20)
Total Protein: 7 g/dL (ref 6.4–8.3)

## 2016-10-04 MED ORDER — DRONABINOL 5 MG PO CAPS: 5.0000 mg | ORAL_CAPSULE | Freq: Two times a day (BID) | ORAL | 1 refills | Status: DC

## 2016-10-04 MED ORDER — SODIUM CHLORIDE 0.9 % IV SOLN
25.0000 mg/m2 | Freq: Once | INTRAVENOUS | Status: AC
  Administered 2016-10-04: 43 mg via INTRAVENOUS
  Filled 2016-10-04: qty 43

## 2016-10-04 MED ORDER — SODIUM CHLORIDE 0.9 % IV SOLN
Freq: Once | INTRAVENOUS | Status: AC
  Administered 2016-10-04: 10:00:00 via INTRAVENOUS

## 2016-10-04 MED ORDER — PALONOSETRON HCL INJECTION 0.25 MG/5ML
INTRAVENOUS | Status: AC
Start: 2016-10-04 — End: 2016-10-04
  Filled 2016-10-04: qty 5

## 2016-10-04 MED ORDER — POTASSIUM CHLORIDE 2 MEQ/ML IV SOLN
Freq: Once | INTRAVENOUS | Status: AC
  Administered 2016-10-04: 10:00:00 via INTRAVENOUS
  Filled 2016-10-04: qty 10

## 2016-10-04 MED ORDER — SODIUM CHLORIDE 0.9 % IV SOLN
800.0000 mg/m2 | Freq: Once | INTRAVENOUS | Status: AC
  Administered 2016-10-04: 1368 mg via INTRAVENOUS
  Filled 2016-10-04: qty 35.98

## 2016-10-04 MED ORDER — HEPARIN SOD (PORK) LOCK FLUSH 100 UNIT/ML IV SOLN
250.0000 [IU] | Freq: Once | INTRAVENOUS | Status: DC | PRN
  Filled 2016-10-04: qty 5

## 2016-10-04 MED ORDER — SODIUM CHLORIDE 0.9% FLUSH
10.0000 mL | INTRAVENOUS | Status: DC | PRN
  Administered 2016-10-04: 10 mL
  Filled 2016-10-04: qty 10

## 2016-10-04 MED ORDER — HEPARIN SOD (PORK) LOCK FLUSH 100 UNIT/ML IV SOLN
500.0000 [IU] | Freq: Once | INTRAVENOUS | Status: AC | PRN
  Administered 2016-10-04: 500 [IU]
  Filled 2016-10-04: qty 5

## 2016-10-04 MED ORDER — PALONOSETRON HCL INJECTION 0.25 MG/5ML
0.2500 mg | Freq: Once | INTRAVENOUS | Status: AC
  Administered 2016-10-04: 0.25 mg via INTRAVENOUS

## 2016-10-04 MED ORDER — SODIUM CHLORIDE 0.9 % IV SOLN
Freq: Once | INTRAVENOUS | Status: AC
  Administered 2016-10-04: 12:00:00 via INTRAVENOUS
  Filled 2016-10-04: qty 5

## 2016-10-04 NOTE — Telephone Encounter (Signed)
Per 2/5 LOS and staff message I have scheduled appts and gave calendar

## 2016-10-04 NOTE — Progress Notes (Signed)
Pt saw Dr. Burr Medico today prior to chemo.  OK to treat with ANC 1.0  As per md.  Pt to receive Neulasta on 10/05/16.

## 2016-10-04 NOTE — Patient Instructions (Signed)
[facility] Discharge Instructions for Patients Receiving Chemotherapy  Today you received the following chemotherapy agents:  Gemzar, Cisplatin  To help prevent nausea and vomiting after your treatment, we encourage you to take your nausea medication as prescribed.   If you develop nausea and vomiting that is not controlled by your nausea medication, call the clinic.   BELOW ARE SYMPTOMS THAT SHOULD BE REPORTED IMMEDIATELY:  *FEVER GREATER THAN 100.5 F  *CHILLS WITH OR WITHOUT FEVER  NAUSEA AND VOMITING THAT IS NOT CONTROLLED WITH YOUR NAUSEA MEDICATION  *UNUSUAL SHORTNESS OF BREATH  *UNUSUAL BRUISING OR BLEEDING  TENDERNESS IN MOUTH AND THROAT WITH OR WITHOUT PRESENCE OF ULCERS  *URINARY PROBLEMS  *BOWEL PROBLEMS  UNUSUAL RASH Items with * indicate a potential emergency and should be followed up as soon as possible.  Feel free to call the clinic you have any questions or concerns. The clinic phone number is [phone].  Please show the [facility] at check-in to the Emergency Department and triage nurse.

## 2016-10-05 ENCOUNTER — Ambulatory Visit (HOSPITAL_BASED_OUTPATIENT_CLINIC_OR_DEPARTMENT_OTHER): Payer: Federal, State, Local not specified - PPO

## 2016-10-05 VITALS — BP 106/47 | HR 83 | Temp 97.2°F | Resp 20

## 2016-10-05 DIAGNOSIS — C221 Intrahepatic bile duct carcinoma: Secondary | ICD-10-CM

## 2016-10-05 DIAGNOSIS — C7801 Secondary malignant neoplasm of right lung: Secondary | ICD-10-CM

## 2016-10-05 DIAGNOSIS — D701 Agranulocytosis secondary to cancer chemotherapy: Secondary | ICD-10-CM

## 2016-10-05 MED ORDER — PEGFILGRASTIM INJECTION 6 MG/0.6ML ~~LOC~~
6.0000 mg | PREFILLED_SYRINGE | Freq: Once | SUBCUTANEOUS | Status: AC
  Administered 2016-10-05: 6 mg via SUBCUTANEOUS
  Filled 2016-10-05: qty 0.6

## 2016-10-05 NOTE — Patient Instructions (Signed)
Pegfilgrastim injection What is this medicine? PEGFILGRASTIM (PEG fil gra stim) is a long-acting granulocyte colony-stimulating factor that stimulates the growth of neutrophils, a type of white blood cell important in the body's fight against infection. It is used to reduce the incidence of fever and infection in patients with certain types of cancer who are receiving chemotherapy that affects the bone marrow, and to increase survival after being exposed to high doses of radiation. This medicine may be used for other purposes; ask your health care provider or pharmacist if you have questions. COMMON BRAND NAME(S): Neulasta What should I tell my health care provider before I take this medicine? They need to know if you have any of these conditions: -kidney disease -latex allergy -ongoing radiation therapy -sickle cell disease -skin reactions to acrylic adhesives (On-Body Injector only) -an unusual or allergic reaction to pegfilgrastim, filgrastim, other medicines, foods, dyes, or preservatives -pregnant or trying to get pregnant -breast-feeding How should I use this medicine? This medicine is for injection under the skin. If you get this medicine at home, you will be taught how to prepare and give the pre-filled syringe or how to use the On-body Injector. Refer to the patient Instructions for Use for detailed instructions. Use exactly as directed. Take your medicine at regular intervals. Do not take your medicine more often than directed. It is important that you put your used needles and syringes in a special sharps container. Do not put them in a trash can. If you do not have a sharps container, call your pharmacist or healthcare provider to get one. Talk to your pediatrician regarding the use of this medicine in children. While this drug may be prescribed for selected conditions, precautions do apply. Overdosage: If you think you have taken too much of this medicine contact a poison control  center or emergency room at once. NOTE: This medicine is only for you. Do not share this medicine with others. What if I miss a dose? It is important not to miss your dose. Call your doctor or health care professional if you miss your dose. If you miss a dose due to an On-body Injector failure or leakage, a new dose should be administered as soon as possible using a single prefilled syringe for manual use. What may interact with this medicine? Interactions have not been studied. Give your health care provider a list of all the medicines, herbs, non-prescription drugs, or dietary supplements you use. Also tell them if you smoke, drink alcohol, or use illegal drugs. Some items may interact with your medicine. This list may not describe all possible interactions. Give your health care provider a list of all the medicines, herbs, non-prescription drugs, or dietary supplements you use. Also tell them if you smoke, drink alcohol, or use illegal drugs. Some items may interact with your medicine. What should I watch for while using this medicine? You may need blood work done while you are taking this medicine. If you are going to need a MRI, CT scan, or other procedure, tell your doctor that you are using this medicine (On-Body Injector only). What side effects may I notice from receiving this medicine? Side effects that you should report to your doctor or health care professional as soon as possible: -allergic reactions like skin rash, itching or hives, swelling of the face, lips, or tongue -dizziness -fever -pain, redness, or irritation at site where injected -pinpoint red spots on the skin -red or dark-brown urine -shortness of breath or breathing problems -stomach or   side pain, or pain at the shoulder -swelling -tiredness -trouble passing urine or change in the amount of urine Side effects that usually do not require medical attention (report to your doctor or health care professional if they  continue or are bothersome): -bone pain -muscle pain This list may not describe all possible side effects. Call your doctor for medical advice about side effects. You may report side effects to FDA at 1-800-FDA-1088. Where should I keep my medicine? Keep out of the reach of children. Store pre-filled syringes in a refrigerator between 2 and 8 degrees C (36 and 46 degrees F). Do not freeze. Keep in carton to protect from light. Throw away this medicine if it is left out of the refrigerator for more than 48 hours. Throw away any unused medicine after the expiration date. NOTE: This sheet is a summary. It may not cover all possible information. If you have questions about this medicine, talk to your doctor, pharmacist, or health care provider.  2017 [employer] (2014-09-05 14:30:14)  

## 2016-10-05 NOTE — Addendum Note (Signed)
Addended by: Truitt Merle on: 10/05/2016 08:34 AM   Modules accepted: Orders

## 2016-10-08 ENCOUNTER — Telehealth: Payer: Self-pay | Admitting: *Deleted

## 2016-10-08 NOTE — Telephone Encounter (Signed)
Call from pt reporting she developed tingling in head on R and slight dizziness. Also has tingling chest and neck bilaterally. This began 15 minutes ago after eating tortilla chips. Pt feels flushed, reports the sensations are intermittent. She also reports heartburn "on R side and middle of chest." Informed her it is OK to take her OTC antacid. The only recent change in medications was the addition of Neulasta. Pt took Claritin for 3 days. Did not take any today.  Will review with MD for further instructions.

## 2016-10-08 NOTE — Telephone Encounter (Signed)
Spoke with pt, and was informed that the tingling in head was much better and no dizziness.  The tingling in chest seems to move in circle.  Denied shortness of breath;  Eating and drinking fine, bowel and bladder function fine.  Instructed pt to take Claritin today and as needed.  Informed pt that her symptoms could be related to Neulasta and/or chemo as per Dr. Burr Medico.  Informed pt to monitor her symptoms for now.  Pt understood to call office back if symptoms worsened.  Pt also understood to go to ER if symptoms worsened during night and over weekend.

## 2016-10-12 ENCOUNTER — Telehealth: Payer: Self-pay | Admitting: *Deleted

## 2016-10-12 NOTE — Telephone Encounter (Signed)
Called pt informed that appts for Monday will be moved to a date when Dr Burr Medico is in the office & post CT scans.  Notified Central Scheduling to schedule CT.  Message to schedulers.  Pt expressed understanding.

## 2016-10-13 ENCOUNTER — Telehealth: Payer: Self-pay | Admitting: Hematology

## 2016-10-13 NOTE — Telephone Encounter (Signed)
lvm to inform pt of r/s appts to 2/20 at 815 am per LOS. Also left central scheduling number for pt r/s CT scan appt

## 2016-10-14 ENCOUNTER — Telehealth: Payer: Self-pay | Admitting: Hematology

## 2016-10-14 NOTE — Telephone Encounter (Signed)
lvm to inform pt of 2/19 and 2/0 appt date/time per LOS

## 2016-10-14 NOTE — Progress Notes (Signed)
[place]  [phone] [phone]  Clinic Follow Up Note   Patient Care Team: [name], DO as PCP - General Jerene Bears, MD as Consulting Physician (Gastroenterology) 10/19/2016  CHIEF COMPLAINTS:  Follow up metastatic intrahepatic cholangiocarcinoma  Oncology History   Metastatic cholangiocarcinoma to lung [facility])   Staging form: Perihilar Bile Ducts, AJCC 7th Edition   - Clinical stage from 07/07/2016: Stage IVB (T2b, N1, M1) - Signed by Truitt Merle, MD on 07/28/2016      Metastatic cholangiocarcinoma to lung ([place])   05/22/2014 Procedure    Routine screening colonoscopy showed a sessile polyp measuring 4 mm in the sigmoid colon, removed, otherwise negative.      06/29/2016 Imaging    CT chest, abdomen and pelvis showed 2 indeterminant nodule in left and the right lung, 4-69m, indeterminate a hypovascular liver lesions largest in the caudate lobe measuring 2.6 cm, mild abdominal lymphadenopathy in the gastrohepatic ligament and portocaval space.      07/02/2016 Imaging    Abdominal MRI with and without contrast showed 2 lobular lesions in the caudate lobe, and is regular lymph nodes in the gastrohepatic ligament which is adjacent to the liver lesion, suspicious for malignancy. 2 small lesions in the right hepatic lobe are indeterminate, but concerning for metastasis.      07/07/2016 Procedure    EGD was negative, EUS biopsy of the liver and adjacent lymph node      07/07/2016 Initial Diagnosis    Metastatic cholangiocarcinoma to lung ([facility]      07/07/2016 Initial Biopsy    Fine-needle aspiration of the liver lesion in caudate lobe and adjacent lymph nodes both showed metastatic adenocarcinoma.      08/11/2016 -  Chemotherapy    Cisplatin 25 mg/m, gemcitabine 1000 mg/m, on day one and 8, every 21 days        10/18/2016 Imaging    CT CAP   IMPRESSION: 1. Again noted are multifocal liver metastasis. Overall there has been  no significant interval change in overall volume of liver metastases. 2. Similar appearance of upper abdominal adenopathy 3. Slight decrease in size of small pulmonary metastases.       HISTORY OF PRESENTING ILLNESS:  Nancy Brosious51y.o. female is here because of Her recently diagnosed metastatic glandular carcinoma. She is accompanied by her husband and mother to my clinic today.  She has been having RUQ abdominal pain since mid 04/2016, she was seen by PCP and was treated for gas with GI cocktail and pepcide, which she did not help much. Her pain got worse, and radiates to right shoulder, she denies significant nausea, or bloating, BM normal, no fever, cough or dyspnea. She recently noticed mild chest discomfort in the low sternum area. She initially had the lab, ultrasound done by her primary care physician, which was unrevealing. She was referred to GI Dr. PHilarie Fredricksonand underwent EGD and CT scan, which showed a lobulated mass in the caudate lobe of liver, with at adjacent adenopathy. He underwent EUS and fine-needle biopsy of the liver mass and lymph nodes, all reviewed adenocarcinoma.  She has lost about 7lbs in the past 3 months. She has mild fatigue, but able to function at home. She has stopped working due to her abdominal pain and fatigue. She takes Norco every 1-3 times a day, and her pain seems not well controlled.  CURRENT THERAPY: Cisplatin and gemcitabine on Day 1, 8 every 21 days  INTERIM HISTORY: Nancy [name]  for follow up and cycle 4 day 1 chemotherapy. She reports she is feeling well overall. She has been off chemotherapy for 2 weeks and reports increased appetite and weight gain. The patient reports some fatigue with exertion. She reports a slight tingling sensation after injection, for which she takes Claratin with relief. She reports some discomfort 1/10 to her chest, but she has not needed any pain medication to manage this; it is relieved with occasional heating pad use.  The patient reports recent onset of bruising across her arms and legs.  MEDICAL HISTORY:  Past Medical History:  Diagnosis Date  . Allergy    SEASONAL  . Anemia    HIGH SCHOOL  . Atopic eczema   . Fibrocystic breast disease   . GERD (gastroesophageal reflux disease)   . Hx of migraines    seasonal   . Pneumonia    6 years ago   . TIA (transient ischemic attack)     SURGICAL HISTORY: Past Surgical History:  Procedure Laterality Date  . EUS N/A 07/07/2016   Procedure: UPPER ENDOSCOPIC ULTRASOUND (EUS) RADIAL;  Surgeon: [name], MD;  Location: [facility];  Service: Endoscopy;  Laterality: N/A;  . IR GENERIC HISTORICAL  08/03/2016   IR US GUIDE VASC ACCESS RIGHT 08/03/2016 Corrie Mckusick, DO [facility]INTER[facility]  . IR GENERIC HISTORICAL  08/03/2016   IR FLUORO GUIDE PORT INSERTION RIGHT 08/03/2016 Corrie Mckusick, DO [facility]  . MYOMECTOMY  2002    SOCIAL HISTORY: Social History   Social History  . Marital status: Married    Spouse name: N/A  . Number of children: 0  . Years of education: 14   Occupational History  . [employer]  .  [employer]   Social History Main Topics  . Smoking status: Never Smoker  . Smokeless tobacco: Never Used  . Alcohol use Yes     Comment: maybe 3 times a year  . Drug use: No  . Sexual activity: Yes    Partners: Male   Other Topics Concern  . Not on file   Social History Narrative  . No narrative on file    FAMILY HISTORY: Family History  Problem Relation Age of Onset  . Gout Mother   . Diabetes Mother   . Hypertension Father   . Diabetes Father   . Breast cancer Paternal Aunt   . Cancer Maternal Grandfather     throat cancer   . Cancer Cousin 22    GI cancer   . Thyroid disease Neg Hx     ALLERGIES:  is allergic to penicillins and lipitor [atorvastatin].  MEDICATIONS:  Current Outpatient Prescriptions  Medication Sig Dispense Refill  . ALPRAZolam (XANAX) 0.5 MG tablet Take 1 tablet (0.5 mg total) by mouth as  needed for anxiety. 10 tablet 0  . aspirin 81 MG tablet Take 81 mg by mouth daily.    Marland Kitchen CARAFATE 1 GM/10ML suspension TAKE 10 MLS (1 G TOTAL) BY MOUTH 4 (FOUR) TIMES DAILY - WITH MEALS AND AT BEDTIME. 420 mL 1  . cetirizine (ZYRTEC) 10 MG tablet Take 10 mg by mouth daily as needed. For allergies     . Cholecalciferol (VITAMIN D3) 5000 UNITS CAPS Take 1 capsule by mouth daily.    . Ciclopirox 1 % shampoo Use as directed 2x a week for up to 4 weeks 120 mL 1  . COLOSTRUM PO Take 1.25 g by mouth 2 (two) times daily. 1.25 grams twice daily    .  dicyclomine (BENTYL) 20 MG tablet 3 (three) times daily as needed.     . dronabinol (MARINOL) 5 MG capsule Take 1 capsule (5 mg total) by mouth 2 (two) times daily before a meal. 60 capsule 1  . HYDROcodone-acetaminophen (NORCO) 5-325 MG tablet Take 1-2 tablets by mouth every 6 (six) hours as needed for severe pain. 60 tablet 0  . lidocaine-prilocaine (EMLA) cream Apply 1 application topically as needed. Apply to portacath 1 1/2 hours - 2 hours prior to procedures as needed. 30 g 1  . meclizine (ANTIVERT) 50 MG tablet Take 1 tablet (50 mg total) by mouth 3 (three) times daily as needed. 30 tablet 0  . Menaquinone-7 (VITAMIN K2) 40 MCG TABS Take by mouth.    . mometasone (ELOCON) 0.1 % lotion Apply topically daily. 60 mL 0  . NON FORMULARY 1 capsule 1 day or 1 dose. Pregnelone, pt reports she takes daily.    . Nutritional Supplements (DHEA) 15-50 MG CAPS Take 1 capsule by mouth 3 (three) times a week.    . nystatin (MYCOSTATIN) 100000 UNIT/ML suspension Take 5 mLs (500,000 Units total) by mouth 4 (four) times daily. 60 mL 0  . omeprazole (PRILOSEC) 40 MG capsule Take 1 capsule (40 mg total) by mouth 2 (two) times daily before a meal. 60 capsule 11  . ondansetron (ZOFRAN) 8 MG tablet Take 1 tablet (8 mg total) by mouth every 8 (eight) hours as needed for nausea or vomiting. 20 tablet 1  . Prenatal Vit-Fe Fumarate-FA (PRENATAL MULTIVITAMIN) TABS tablet Take 2  tablets by mouth daily at 12 noon.    . sulfamethoxazole-trimethoprim (BACTRIM DS,SEPTRA DS) 800-160 MG tablet Take 1 tablet by mouth 2 (two) times daily.    Marland Kitchen triamcinolone cream (KENALOG) 0.1 % Apply topically.    Marland Kitchen VITAMIN K PO Take 1 capsule by mouth daily.     No current facility-administered medications for this visit.    Facility-Administered Medications Ordered in Other Visits  Medication Dose Route Frequency Provider Last Rate Last Dose  . 0.9 %  sodium chloride infusion   Intravenous Once Truitt Merle, MD      . alteplase (CATHFLO ACTIVASE) injection 2 mg  2 mg Intracatheter Once PRN Truitt Merle, MD      . heparin lock flush 100 unit/mL  250 Units Intracatheter Once PRN Truitt Merle, MD      . heparin lock flush 100 unit/mL  500 Units Intracatheter Once PRN Truitt Merle, MD      . sodium chloride flush (NS) 0.9 % injection 10 mL  10 mL Intracatheter PRN Truitt Merle, MD   10 mL at 09/09/16 1645  . sodium chloride flush (NS) 0.9 % injection 10 mL  10 mL Intracatheter PRN Truitt Merle, MD        REVIEW OF SYSTEMS:   Constitutional: Denies fevers, chills or abnormal night sweats (+) weight gain, fatigue Eyes: Denies blurriness of vision, double vision or watery eyes Ears, nose, mouth, throat, and face: Denies mucositis or sore throat Respiratory: Denies cough, dyspnea or wheezes Cardiovascular: Denies palpitation, chest discomfort or lower extremity swelling Gastrointestinal:  Denies heartburn or change in bowel habits (+) upper abdominal pain improving (+) nausea managed with medication Skin: Denies abnormal skin rashes Lymphatics: Denies new lymphadenopathy or easy bruising Neurological:Denies numbness, tingling or new weaknesses, (+) dizziness, tingling sensation Behavioral/Psych: Mood is stable, no new changes  All other systems were reviewed with the patient and are negative.  PHYSICAL EXAMINATION: ECOG PERFORMANCE STATUS: 1 -  Symptomatic but completely ambulatory BP 115/62 (BP Location: Left  Arm, Patient Position: Sitting)   Pulse 71   Temp 98.6 F (37 C) (Oral)   Resp 18   Ht '5\' 6"'$  (1.676 m)   Wt 135 lb 4.8 oz (61.4 kg)   SpO2 100%   BMI 21.84 kg/m  GENERAL:alert, no distress and comfortable SKIN: skin color, texture, turgor are normal, no rashes or significant lesions, several hematomas on extremities EYES: normal, conjunctiva are pink and non-injected, sclera clear OROPHARYNX:no exudate, no erythema and lips, buccal mucosa NECK: supple, thyroid normal size, non-tender, without nodularity LYMPH:  no palpable lymphadenopathy in the cervical, axillary or inguinal LUNGS: clear to auscultation and percussion with normal breathing effort HEART: regular rate & rhythm and no murmurs and no lower extremity edema ABDOMEN:abdomen soft, non-tender and normal bowel sounds Musculoskeletal:no cyanosis of digits and no clubbing  PSYCH: alert & oriented x 3 with fluent speech NEURO: no focal motor/sensory deficits  LABORATORY DATA:  I have reviewed the data as listed CBC Latest Ref Rng & Units 10/19/2016 10/04/2016 09/27/2016  WBC 3.9 - 10.3 10e3/uL 4.2 3.0(L) 2.9(L)  Hemoglobin 11.6 - 15.9 g/dL 10.2(L) 11.0(L) 10.9(L)  Hematocrit 34.8 - 46.6 % 30.0(L) 32.6(L) 32.2(L)  Platelets 145 - 400 10e3/uL 282 108(L) 168   CMP Latest Ref Rng & Units 10/19/2016 10/04/2016 09/27/2016  Glucose 70 - 140 mg/dl 106 98 87  BUN 7.0 - 26.0 mg/dL 5.2(L) 6.7(L) 6.3(L)  Creatinine 0.6 - 1.1 mg/dL 0.6 0.7 0.7  Sodium 136 - 145 mEq/L 140 140 141  Potassium 3.5 - 5.1 mEq/L 3.6 4.2 3.9  Chloride 101 - 111 mmol/L - - -  CO2 22 - 29 mEq/L '24 25 24  '$ Calcium 8.4 - 10.4 mg/dL 8.8 9.6 9.1  Total Protein 6.4 - 8.3 g/dL 6.3(L) 7.0 6.3(L)  Total Bilirubin 0.20 - 1.20 mg/dL <0.22 0.28 0.50  Alkaline Phos 40 - 150 U/L 44 41 35(L)  AST 5 - 34 U/L '18 17 17  '$ ALT 0 - 55 U/L '21 21 14   '$ Neut# 2.2K TODAY   CA19.9 (0-35U/ml) 07/28/2016: 36 08/18/2016: 51 09/13/2016: 56  PATHOLOGY REPORT  Diagnosis 07/07/2016 FINE  NEEDLE ASPIRATION, ENDOSCOPIC, LIVER (SPECIMEN 2 OF 2 COLLECTED 07/07/16): MALIGNANT CELLS CONSISTENT WITH ADENOCARCINOMA.  ADDITIONAL INFORMATION: Per request, immunohistochemistry was performed. The cells are positive for cytokeratin 7 and negative for cytokeratin 20 and CDX-2. This excludes a colorectal primary. Pancreaticobiliary and upper GI sources remain in the differential. Called to Dr. Burr Medico on 07/28/2016.  Diagnosis 07/07/2016 FINE NEEDLE ASPIRATION, ENDOSCOPIC, GASTRO-HEPATIC LYMPH NODE (SPECIMEN 1 OF 2 COLLECTED 07/07/16): MALIGNANT CELLS CONSISTENT WITH ADENOCARCINOMA, SEE COMMENT. Preliminary Diagnosis Intraoperative Diagnosis: Few atypical glandular clusters - recommend additional material. 4) Adequate ([facility])  RADIOGRAPHIC STUDIES: I have personally reviewed the radiological images as listed and agreed with the findings in the report. Ct Chest W Contrast  Result Date: 10/19/2016 CLINICAL DATA:  History of gallbladder cancer. EXAM: CT CHEST, ABDOMEN, AND PELVIS WITH CONTRAST TECHNIQUE: Multidetector CT imaging of the chest, abdomen and pelvis was performed following the standard protocol during bolus administration of intravenous contrast. CONTRAST:  158m ISOVUE-300 IOPAMIDOL (ISOVUE-300) INJECTION 61% COMPARISON:  07/15/2016 FINDINGS: CT CHEST FINDINGS Cardiovascular: Normal heart size. There is no pericardial effusion. Mediastinum/Nodes: The trachea appears patent and is midline. Normal appearance of the esophagus. No enlarged mediastinal or hilar lymph nodes. Lungs/Pleura: There is no pleural fluid. No airspace consolidation or atelectasis. The index nodule within the left upper lobe measures 4  mm, image 55 of series 6. Previously 5 mm. Right upper lobe index nodule measures 4 mm, image 75 of series 6. Previously 6 mm. No new pulmonary nodules identified. Musculoskeletal: No chest wall mass or suspicious bone lesions identified. CT ABDOMEN PELVIS FINDINGS Hepatobiliary: Scattered liver  metastases. The dominant lesion within the caudate lobe measures 2.6 cm, image 57 of series 2. Previously this measured the same. Within the right lobe measures 1.2 cm, image 59 of series 2. On the previous exam this measured 0.8 cm. Lateral segment of left lobe of liver index lesion measures 0.4 cm, image 53 of series 2. Previously 0.6 cm. In the inferior segment of the right lobe of liver lesion measures 0.9 cm, image number 70 of series 2. Previously this measured the same. Pancreas: Unremarkable. No pancreatic ductal dilatation or surrounding inflammatory changes. Spleen: Normal in size without focal abnormality. Adrenals/Urinary Tract: Adrenal glands are unremarkable. Kidneys are normal, without renal calculi, focal lesion, or hydronephrosis. Bladder is unremarkable. Stomach/Bowel: The stomach appears normal. The small bowel loops have a normal course and caliber without evidence for bowel obstruction. Unremarkable appearance of the colon. Vascular/Lymphatic: Aortic atherosclerosis. No aneurysm. Necrotic portacaval node measures 2.1 cm, image number 60 of series 2. Previously 2.4 cm. Gastrohepatic ligament node measures 1.6 cm, image 58 of series 2. Previously 1.6 cm. Reproductive: Partially calcified fibroid uterus is similar to previous exam. Other: No abdominal wall hernia or abnormality. No abdominopelvic ascites. Musculoskeletal: Degenerative disc disease noted within the lumbar spine. IMPRESSION: 1. Again noted are multifocal liver metastasis. Overall there has been no significant interval change in overall volume of liver metastases. 2. Similar appearance of upper abdominal adenopathy 3. Slight decrease in size of small pulmonary metastases. Electronically Signed   By: Kerby Moors M.D.   On: 10/19/2016 08:58   Ct Abdomen Pelvis W Contrast  Result Date: 10/19/2016 CLINICAL DATA:  History of gallbladder cancer. EXAM: CT CHEST, ABDOMEN, AND PELVIS WITH CONTRAST TECHNIQUE: Multidetector CT imaging of  the chest, abdomen and pelvis was performed following the standard protocol during bolus administration of intravenous contrast. CONTRAST:  141m ISOVUE-300 IOPAMIDOL (ISOVUE-300) INJECTION 61% COMPARISON:  07/15/2016 FINDINGS: CT CHEST FINDINGS Cardiovascular: Normal heart size. There is no pericardial effusion. Mediastinum/Nodes: The trachea appears patent and is midline. Normal appearance of the esophagus. No enlarged mediastinal or hilar lymph nodes. Lungs/Pleura: There is no pleural fluid. No airspace consolidation or atelectasis. The index nodule within the left upper lobe measures 4 mm, image 55 of series 6. Previously 5 mm. Right upper lobe index nodule measures 4 mm, image 75 of series 6. Previously 6 mm. No new pulmonary nodules identified. Musculoskeletal: No chest wall mass or suspicious bone lesions identified. CT ABDOMEN PELVIS FINDINGS Hepatobiliary: Scattered liver metastases. The dominant lesion within the caudate lobe measures 2.6 cm, image 57 of series 2. Previously this measured the same. Within the right lobe measures 1.2 cm, image 59 of series 2. On the previous exam this measured 0.8 cm. Lateral segment of left lobe of liver index lesion measures 0.4 cm, image 53 of series 2. Previously 0.6 cm. In the inferior segment of the right lobe of liver lesion measures 0.9 cm, image number 70 of series 2. Previously this measured the same. Pancreas: Unremarkable. No pancreatic ductal dilatation or surrounding inflammatory changes. Spleen: Normal in size without focal abnormality. Adrenals/Urinary Tract: Adrenal glands are unremarkable. Kidneys are normal, without renal calculi, focal lesion, or hydronephrosis. Bladder is unremarkable. Stomach/Bowel: The stomach appears normal. The small  bowel loops have a normal course and caliber without evidence for bowel obstruction. Unremarkable appearance of the colon. Vascular/Lymphatic: Aortic atherosclerosis. No aneurysm. Necrotic portacaval node measures 2.1  cm, image number 60 of series 2. Previously 2.4 cm. Gastrohepatic ligament node measures 1.6 cm, image 58 of series 2. Previously 1.6 cm. Reproductive: Partially calcified fibroid uterus is similar to previous exam. Other: No abdominal wall hernia or abnormality. No abdominopelvic ascites. Musculoskeletal: Degenerative disc disease noted within the lumbar spine. IMPRESSION: 1. Again noted are multifocal liver metastasis. Overall there has been no significant interval change in overall volume of liver metastases. 2. Similar appearance of upper abdominal adenopathy 3. Slight decrease in size of small pulmonary metastases. Electronically Signed   By: Kerby Moors M.D.   On: 10/19/2016 08:58   Upper EUS 07/07/2016 Dr. Ardis Hughs  - Endoscopically normal UGI tract (good views with standard gastroscope) - Vaguely bordered 4cm mass in the caudate lobe of liver that is confluent with what appear to be enlarged gastrohepatic lymphnodes (these may represent direct tumor extension however). Preliminary cytology from the lymphnodes shows malignancy, glandular. Preliminary cytology from the liver mass shows the same. Await final cytology results but this is most suspicious for peripheral intrahepatic cholangiocarcinoma with adjacent malignant adenopathy.   COLONOSCOPY 05/22/2014 ENDOSCOPIC IMPRESSION: 1. Sessile polyp measuring 4 mm in size was found in the sigmoid colon; polypectomy was performed with a cold snare 2. The colon mucosa was otherwise normal  ASSESSMENT & PLAN:  51 y.o. African-American female, without significant past medical history, presented with abdominal pain  1. Metastatic intrahepatic cholangiocarcinoma, with probable lung metastasis, cT2bN1M1, stage IV -I previously reviewed her CT, MRI, endoscopy findings and her biopsy results with patient and her family members in details. -We have previously reviewed her case in our GI tumor board. Based on the scans and endoscopy findings, this is  most consistent with cholangiocarcinoma. Her liver and inguinal biopsy showed adenocarcinoma, IHC was consistent with pancreatic-biliary primary -She has 2 additional small liver lesions and 2 4-51m lung lesions, which are indeterminate but that is suspicious for metastatic disease. -She was seen by surgeon Dr. BBarry Dieneswho thinks she is a not a candidate for surgical resection due to her metastatic disease. -We previously discussed her cancer is incurable at this stage, the goal of therapy is palliation, to prolong life and improve her quality life -she is on first-line cisplatin and gemcitabine, on day 1, 8 every 21 days.  -She has been tolerating chemotherapy moderately well, but had significant neutropenia and thrombocytopenia, cycle 2 chemotherapy was dose reduced -I discussed and reviewed her restaging CT scan from yesterday, which showed stable disease overall, no other new lesions. She is clinically doing well, her abdominal pain has improved, we'll continue chemotherapy with cisplatin and gemcitabine. -lab reviewed with her, mild neutropenia and thrombocytopenia, OK to treat, we'll proceed cycle for chemotherapy today -She would prefer to receive neulasta versus onpro  2. Abdominal pain, improving -continue hydrocodone/acetaminophen every 4-6 hours as needed for pain control  -Constipation and management reviewed with patient  3. Nausea and anorexia and weight loss  -We reviewed her nausea management, she will continue Compazine and Zofran -I previously encouraged her to take a nutritional supplement -We previously discussed appetite stimulant, she is on Marinol, dose was increased to 5 mg twice daily last week, appetite improved, will continue. -she will f/u with dietician  -Previously encouraged her to have a high calorie, high protein diet.  4. Neutropenia  -Secondary to chemotherapy, she  is on Neulasta after day 8 chemotherapy. -Continue monitoring  5. Goal of care discussion    -We previously discussed the incurable nature of her cancer, and the overall poor prognosis, especially if she does not have good response to chemotherapy or progress on chemo -The patient understands the goal of care is palliative. -I have recommended DNR/DNI, she will think about it   Plan -Cisplatin / Gemcitabine Cycle #4, Day 1 today. She will return for day 8 chemotherapy next week, and neulasta on day 9 -I will see her back in three weeks for follow up and continue chemo  -I advised the patient to maintain hydration to alleviate her dizziness and tingling sensations with treatment.   No orders of the defined types were placed in this encounter.   All questions were answered. The patient knows to call the clinic with any problems, questions or concerns.  I spent 20 minutes counseling the patient face to face. The total time spent in the appointment was 25 minutes and more than 50% was on counseling.  This document serves as a record of services personally performed by Truitt Merle, MD. It was created on her behalf by Maryla Morrow, a trained medical scribe. The creation of this record is based on the scribe's personal observations and the provider's statements to them. This document has been checked and approved by the attending provider.   Truitt Merle, MD 10/19/2016

## 2016-10-18 ENCOUNTER — Ambulatory Visit: Payer: Federal, State, Local not specified - PPO

## 2016-10-18 ENCOUNTER — Other Ambulatory Visit: Payer: Federal, State, Local not specified - PPO

## 2016-10-18 ENCOUNTER — Ambulatory Visit (HOSPITAL_COMMUNITY)
Admission: RE | Admit: 2016-10-18 | Discharge: 2016-10-18 | Disposition: A | Discharge status: Home / Self Care | Payer: Federal, State, Local not specified - PPO | Source: Ambulatory Visit | Attending: Hematology | Admitting: Hematology

## 2016-10-18 ENCOUNTER — Ambulatory Visit: Payer: Federal, State, Local not specified - PPO | Admitting: Nurse Practitioner

## 2016-10-18 DIAGNOSIS — C221 Intrahepatic bile duct carcinoma: Secondary | ICD-10-CM | POA: Insufficient documentation

## 2016-10-18 DIAGNOSIS — C7801 Secondary malignant neoplasm of right lung: Secondary | ICD-10-CM | POA: Diagnosis present

## 2016-10-18 MED ORDER — SODIUM CHLORIDE 0.9 % IJ SOLN
INTRAMUSCULAR | Status: AC
  Filled 2016-10-18: qty 50

## 2016-10-18 MED ORDER — IOPAMIDOL (ISOVUE-300) INJECTION 61%
INTRAVENOUS | Status: AC
  Administered 2016-10-18: 100 mL via INTRAVENOUS
  Filled 2016-10-18: qty 100

## 2016-10-18 MED ORDER — HEPARIN SOD (PORK) LOCK FLUSH 100 UNIT/ML IV SOLN
INTRAVENOUS | Status: AC
  Filled 2016-10-18: qty 5

## 2016-10-19 ENCOUNTER — Ambulatory Visit: Payer: Federal, State, Local not specified - PPO

## 2016-10-19 ENCOUNTER — Ambulatory Visit (HOSPITAL_BASED_OUTPATIENT_CLINIC_OR_DEPARTMENT_OTHER): Payer: Federal, State, Local not specified - PPO

## 2016-10-19 ENCOUNTER — Ambulatory Visit (HOSPITAL_BASED_OUTPATIENT_CLINIC_OR_DEPARTMENT_OTHER): Payer: Federal, State, Local not specified - PPO | Admitting: Hematology

## 2016-10-19 ENCOUNTER — Encounter: Payer: Self-pay | Admitting: Hematology

## 2016-10-19 ENCOUNTER — Other Ambulatory Visit (HOSPITAL_BASED_OUTPATIENT_CLINIC_OR_DEPARTMENT_OTHER): Payer: Federal, State, Local not specified - PPO

## 2016-10-19 VITALS — BP 115/62 | HR 71 | Temp 98.6°F | Resp 18 | Ht 66.0 in | Wt 135.3 lb

## 2016-10-19 DIAGNOSIS — D696 Thrombocytopenia, unspecified: Secondary | ICD-10-CM | POA: Diagnosis not present

## 2016-10-19 DIAGNOSIS — R630 Anorexia: Secondary | ICD-10-CM

## 2016-10-19 DIAGNOSIS — C7801 Secondary malignant neoplasm of right lung: Principal | ICD-10-CM

## 2016-10-19 DIAGNOSIS — R109 Unspecified abdominal pain: Secondary | ICD-10-CM | POA: Diagnosis not present

## 2016-10-19 DIAGNOSIS — C221 Intrahepatic bile duct carcinoma: Secondary | ICD-10-CM

## 2016-10-19 DIAGNOSIS — D701 Agranulocytosis secondary to cancer chemotherapy: Secondary | ICD-10-CM | POA: Diagnosis not present

## 2016-10-19 DIAGNOSIS — Z5111 Encounter for antineoplastic chemotherapy: Secondary | ICD-10-CM

## 2016-10-19 DIAGNOSIS — R634 Abnormal weight loss: Secondary | ICD-10-CM

## 2016-10-19 DIAGNOSIS — R110 Nausea: Secondary | ICD-10-CM

## 2016-10-19 LAB — CBC WITH DIFFERENTIAL/PLATELET
BASO%: 0.7 % (ref 0.0–2.0)
Basophils Absolute: 0 10*3/uL (ref 0.0–0.1)
EOS%: 0.9 % (ref 0.0–7.0)
Eosinophils Absolute: 0 10*3/uL (ref 0.0–0.5)
HCT: 30 % — ABNORMAL LOW (ref 34.8–46.6)
HGB: 10.2 g/dL — ABNORMAL LOW (ref 11.6–15.9)
LYMPH%: 39.3 % (ref 14.0–49.7)
MCH: 29.9 pg (ref 25.1–34.0)
MCHC: 34 g/dL (ref 31.5–36.0)
MCV: 88 fL (ref 79.5–101.0)
MONO#: 0.3 10*3/uL (ref 0.1–0.9)
MONO%: 8 % (ref 0.0–14.0)
NEUT#: 2.2 10*3/uL (ref 1.5–6.5)
NEUT%: 51.1 % (ref 38.4–76.8)
Platelets: 282 10*3/uL (ref 145–400)
RBC: 3.41 10*6/uL — ABNORMAL LOW (ref 3.70–5.45)
RDW: 17.7 % — ABNORMAL HIGH (ref 11.2–14.5)
WBC: 4.2 10*3/uL (ref 3.9–10.3)
lymph#: 1.7 10*3/uL (ref 0.9–3.3)

## 2016-10-19 LAB — COMPREHENSIVE METABOLIC PANEL
ALT: 21 U/L (ref 0–55)
AST: 18 U/L (ref 5–34)
Albumin: 3.8 g/dL (ref 3.5–5.0)
Alkaline Phosphatase: 44 U/L (ref 40–150)
Anion Gap: 9 mEq/L (ref 3–11)
BUN: 5.2 mg/dL — ABNORMAL LOW (ref 7.0–26.0)
CO2: 24 mEq/L (ref 22–29)
Calcium: 8.8 mg/dL (ref 8.4–10.4)
Chloride: 107 mEq/L (ref 98–109)
Creatinine: 0.6 mg/dL (ref 0.6–1.1)
EGFR: >90 mL/min/{1.73_m2} (ref >90)
Glucose: 106 mg/dl (ref 70–140)
Potassium: 3.6 mEq/L (ref 3.5–5.1)
Sodium: 140 mEq/L (ref 136–145)
Total Bilirubin: <0.22 mg/dL (ref 0.20–1.20)
Total Protein: 6.3 g/dL — ABNORMAL LOW (ref 6.4–8.3)

## 2016-10-19 MED ORDER — SODIUM CHLORIDE 0.9% FLUSH
10.0000 mL | INTRAVENOUS | Status: DC | PRN
  Administered 2016-10-19: 10 mL
  Filled 2016-10-19: qty 10

## 2016-10-19 MED ORDER — SODIUM CHLORIDE 0.9 % IV SOLN
Freq: Once | INTRAVENOUS | Status: AC
  Administered 2016-10-19: 13:00:00 via INTRAVENOUS
  Filled 2016-10-19: qty 5

## 2016-10-19 MED ORDER — HEPARIN SOD (PORK) LOCK FLUSH 100 UNIT/ML IV SOLN
500.0000 [IU] | Freq: Once | INTRAVENOUS | Status: AC | PRN
  Administered 2016-10-19: 500 [IU]
  Filled 2016-10-19: qty 5

## 2016-10-19 MED ORDER — CISPLATIN CHEMO INJECTION 100MG/100ML
25.0000 mg/m2 | Freq: Once | INTRAVENOUS | Status: AC
  Administered 2016-10-19: 43 mg via INTRAVENOUS
  Filled 2016-10-19: qty 43

## 2016-10-19 MED ORDER — SODIUM CHLORIDE 0.9 % IV SOLN
Freq: Once | INTRAVENOUS | Status: AC
  Administered 2016-10-19: 11:00:00 via INTRAVENOUS

## 2016-10-19 MED ORDER — PALONOSETRON HCL INJECTION 0.25 MG/5ML
0.2500 mg | Freq: Once | INTRAVENOUS | Status: AC
  Administered 2016-10-19: 0.25 mg via INTRAVENOUS

## 2016-10-19 MED ORDER — POTASSIUM CHLORIDE 2 MEQ/ML IV SOLN
Freq: Once | INTRAVENOUS | Status: AC
  Administered 2016-10-19: 11:00:00 via INTRAVENOUS
  Filled 2016-10-19: qty 10

## 2016-10-19 MED ORDER — SODIUM CHLORIDE 0.9 % IV SOLN
800.0000 mg/m2 | Freq: Once | INTRAVENOUS | Status: AC
  Administered 2016-10-19: 1368 mg via INTRAVENOUS
  Filled 2016-10-19: qty 35.98

## 2016-10-19 MED ORDER — PALONOSETRON HCL INJECTION 0.25 MG/5ML
INTRAVENOUS | Status: AC
  Filled 2016-10-19: qty 5

## 2016-10-19 NOTE — Patient Instructions (Addendum)
[facility] Discharge Instructions for Patients Receiving Chemotherapy  Today you received the following chemotherapy agents:  Gemzar, Cisplatin  To help prevent nausea and vomiting after your treatment, we encourage you to take your nausea medication as prescribed.   If you develop nausea and vomiting that is not controlled by your nausea medication, call the clinic.   BELOW ARE SYMPTOMS THAT SHOULD BE REPORTED IMMEDIATELY:  *FEVER GREATER THAN 100.5 F  *CHILLS WITH OR WITHOUT FEVER  NAUSEA AND VOMITING THAT IS NOT CONTROLLED WITH YOUR NAUSEA MEDICATION  *UNUSUAL SHORTNESS OF BREATH  *UNUSUAL BRUISING OR BLEEDING  TENDERNESS IN MOUTH AND THROAT WITH OR WITHOUT PRESENCE OF ULCERS  *URINARY PROBLEMS  *BOWEL PROBLEMS  UNUSUAL RASH Items with * indicate a potential emergency and should be followed up as soon as possible.  Feel free to call the clinic you have any questions or concerns. The clinic phone number is [phone].  Please show the Reader at check-in to the Emergency Department and triage nurse.

## 2016-10-19 NOTE — Patient Instructions (Signed)

## 2016-10-20 LAB — CANCER ANTIGEN 19-9: CA 19-9: 43 U/mL — ABNORMAL HIGH (ref 0–35)

## 2016-10-21 ENCOUNTER — Telehealth: Payer: Self-pay | Admitting: Hematology

## 2016-10-21 NOTE — Telephone Encounter (Signed)
Spoke with patient and confirmed all appointments

## 2016-10-21 NOTE — Progress Notes (Signed)
[place]  [phone] [phone]  Clinic Follow Up Note   Patient Care Team: [name], DO as PCP - General Jerene Bears, MD as Consulting Physician (Gastroenterology) 10/25/2016  CHIEF COMPLAINTS:  Follow up metastatic intrahepatic cholangiocarcinoma  Oncology History   Metastatic cholangiocarcinoma to lung [facility])   Staging form: Perihilar Bile Ducts, AJCC 7th Edition   - Clinical stage from 07/07/2016: Stage IVB (T2b, N1, M1) - Signed by Truitt Merle, MD on 07/28/2016      Metastatic cholangiocarcinoma to lung ([place])   05/22/2014 Procedure    Routine screening colonoscopy showed a sessile polyp measuring 4 mm in the sigmoid colon, removed, otherwise negative.      06/29/2016 Imaging    CT chest, abdomen and pelvis showed 2 indeterminant nodule in left and the right lung, 4-41m, indeterminate a hypovascular liver lesions largest in the caudate lobe measuring 2.6 cm, mild abdominal lymphadenopathy in the gastrohepatic ligament and portocaval space.      07/02/2016 Imaging    Abdominal MRI with and without contrast showed 2 lobular lesions in the caudate lobe, and is regular lymph nodes in the gastrohepatic ligament which is adjacent to the liver lesion, suspicious for malignancy. 2 small lesions in the right hepatic lobe are indeterminate, but concerning for metastasis.      07/07/2016 Procedure    EGD was negative, EUS biopsy of the liver and adjacent lymph node      07/07/2016 Initial Diagnosis    Metastatic [place]angiocarcinoma to lung [place]      07/07/2016 Initial Biopsy    Fine-needle aspiration of the liver lesion in caudate lobe and adjacent lymph nodes both showed metastatic adenocarcinoma.      08/11/2016 -  Chemotherapy    Cisplatin 25 mg/m, gemcitabine 1000 mg/m, on day one and 8, every 21 days        10/18/2016 Imaging    CT CAP   IMPRESSION: 1. Again noted are multifocal liver metastasis. Overall there has been  no significant interval change in overall volume of liver metastases. 2. Similar appearance of upper abdominal adenopathy 3. Slight decrease in size of small pulmonary metastases.       HISTORY OF PRESENTING ILLNESS:  Nancy Emmerich51y.o. female is here because of Her recently diagnosed metastatic glandular carcinoma. She is accompanied by her husband and mother to my clinic today.  She has been having RUQ abdominal pain since mid 04/2016, she was seen by PCP and was treated for gas with GI cocktail and pepcide, which she did not help much. Her pain got worse, and radiates to right shoulder, she denies significant nausea, or bloating, BM normal, no fever, cough or dyspnea. She recently noticed mild chest discomfort in the low sternum area. She initially had the lab, ultrasound done by her primary care physician, which was unrevealing. She was referred to GI Dr. PHilarie Fredricksonand underwent EGD and CT scan, which showed a lobulated mass in the caudate lobe of liver, with at adjacent adenopathy. He underwent EUS and fine-needle biopsy of the liver mass and lymph nodes, all reviewed adenocarcinoma.  She has lost about 7lbs in the past 3 months. She has mild fatigue, but able to function at home. She has stopped working due to her abdominal pain and fatigue. She takes Norco every 1-3 times a day, and her pain seems not well controlled.  CURRENT THERAPY: Cisplatin and gemcitabine on Day 1, 8 every 21 days  INTERIM HISTORY: LCameshiareturns  for follow up. She has some abdominal pain that started on Saturday and has been constant, but is slowly improving. Today she rates the pain a 2/10. On Saturday evening she also had some nausea, which was not relieved with medication. Her bowel movements have been normal. Her appetite has improved. Last week she started getting a cold sore on her lip. Denies mouth sores. Following her treatment last week, she had some tingling on the right side of her head as well as some  dizziness. After treatment she is still very tired, but she is able to do most activities. Denies hearing loss, ringing in ears, tingling in hands or feet, or any other concerns.   MEDICAL HISTORY:  Past Medical History:  Diagnosis Date  . Allergy    SEASONAL  . Anemia    HIGH SCHOOL  . Atopic eczema   . Fibrocystic breast disease   . GERD (gastroesophageal reflux disease)   . Hx of migraines    seasonal   . Pneumonia    6 years ago   . TIA (transient ischemic attack)     SURGICAL HISTORY: Past Surgical History:  Procedure Laterality Date  . EUS N/A 07/07/2016   Procedure: UPPER ENDOSCOPIC ULTRASOUND (EUS) RADIAL;  Surgeon: [name], MD;  Location: [facility] ENDOSCOPY;  Service: Endoscopy;  Laterality: N/A;  . IR GENERIC HISTORICAL  08/03/2016   IR US GUIDE VASC ACCESS RIGHT 08/03/2016 Corrie Mckusick, DO [facility]INTER[facility]  . IR GENERIC HISTORICAL  08/03/2016   IR FLUORO GUIDE PORT INSERTION RIGHT 08/03/2016 Corrie Mckusick, DO [facility]  . MYOMECTOMY  2002    SOCIAL HISTORY: Social History   Social History  . Marital status: Married    Spouse name: N/A  . Number of children: 0  . Years of education: 24   Occupational History  . [employer]  .  [employer]   Social History Main Topics  . Smoking status: Never Smoker  . Smokeless tobacco: Never Used  . Alcohol use Yes     Comment: maybe 3 times a year  . Drug use: No  . Sexual activity: Yes    Partners: Male   Other Topics Concern  . Not on file   Social History Narrative  . No narrative on file    FAMILY HISTORY: Family History  Problem Relation Age of Onset  . Gout Mother   . Diabetes Mother   . Hypertension Father   . Diabetes Father   . Breast cancer Paternal Aunt   . Cancer Maternal Grandfather     throat cancer   . Cancer Cousin 60    GI cancer   . Thyroid disease Neg Hx     ALLERGIES:  is allergic to penicillins and lipitor [atorvastatin].  MEDICATIONS:  Current Outpatient Prescriptions    Medication Sig Dispense Refill  . ALPRAZolam (XANAX) 0.5 MG tablet Take 1 tablet (0.5 mg total) by mouth as needed for anxiety. 10 tablet 0  . aspirin 81 MG tablet Take 81 mg by mouth daily.    Marland Kitchen CARAFATE 1 GM/10ML suspension TAKE 10 MLS (1 G TOTAL) BY MOUTH 4 (FOUR) TIMES DAILY - WITH MEALS AND AT BEDTIME. 420 mL 1  . cetirizine (ZYRTEC) 10 MG tablet Take 10 mg by mouth daily as needed. For allergies     . Cholecalciferol (VITAMIN D3) 5000 UNITS CAPS Take 1 capsule by mouth daily.    . Ciclopirox 1 % shampoo Use as directed 2x a week  for up to 4 weeks 120 mL 1  . COLOSTRUM PO Take 1.25 g by mouth 2 (two) times daily. 1.25 grams twice daily    . dicyclomine (BENTYL) 20 MG tablet 3 (three) times daily as needed.     . dronabinol (MARINOL) 5 MG capsule Take 1 capsule (5 mg total) by mouth 2 (two) times daily before a meal. 60 capsule 1  . HYDROcodone-acetaminophen (NORCO) 5-325 MG tablet Take 1-2 tablets by mouth every 6 (six) hours as needed for severe pain. 60 tablet 0  . lidocaine-prilocaine (EMLA) cream Apply 1 application topically as needed. Apply to portacath 1 1/2 hours - 2 hours prior to procedures as needed. 30 g 1  . meclizine (ANTIVERT) 50 MG tablet Take 1 tablet (50 mg total) by mouth 3 (three) times daily as needed. 30 tablet 0  . Menaquinone-7 (VITAMIN K2) 40 MCG TABS Take by mouth.    . mometasone (ELOCON) 0.1 % lotion Apply topically daily. 60 mL 0  . NON FORMULARY 1 capsule 1 day or 1 dose. Pregnelone, pt reports she takes daily.    . Nutritional Supplements (DHEA) 15-50 MG CAPS Take 1 capsule by mouth 3 (three) times a week.    . nystatin (MYCOSTATIN) 100000 UNIT/ML suspension Take 5 mLs (500,000 Units total) by mouth 4 (four) times daily. 60 mL 0  . omeprazole (PRILOSEC) 40 MG capsule Take 1 capsule (40 mg total) by mouth 2 (two) times daily before a meal. 60 capsule 11  . ondansetron (ZOFRAN) 8 MG tablet Take 1 tablet (8 mg total) by mouth every 8 (eight) hours as needed for  nausea or vomiting. 20 tablet 1  . Prenatal Vit-Fe Fumarate-FA (PRENATAL MULTIVITAMIN) TABS tablet Take 2 tablets by mouth daily at 12 noon.    . triamcinolone cream (KENALOG) 0.1 % Apply topically.    Marland Kitchen VITAMIN K PO Take 1 capsule by mouth daily.     No current facility-administered medications for this visit.    Facility-Administered Medications Ordered in Other Visits  Medication Dose Route Frequency Provider Last Rate Last Dose  . alteplase (CATHFLO ACTIVASE) injection 2 mg  2 mg Intracatheter Once PRN Truitt Merle, MD      . heparin lock flush 100 unit/mL  250 Units Intracatheter Once PRN Truitt Merle, MD      . sodium chloride flush (NS) 0.9 % injection 10 mL  10 mL Intracatheter PRN Truitt Merle, MD   10 mL at 09/09/16 1645    REVIEW OF SYSTEMS:   Constitutional: Denies fevers, chills or abnormal night sweats (+) fatigue Eyes: Denies blurriness of vision, double vision or watery eyes Ears, nose, mouth, throat, and face: Denies mucositis or sore throat Respiratory: Denies cough, dyspnea or wheezes Cardiovascular: Denies palpitation, chest discomfort or lower extremity swelling Gastrointestinal:  Denies heartburn or change in bowel habits (+) nausea, abdominal pain Skin: Denies abnormal skin rashes (+) cold sore Lymphatics: Denies new lymphadenopathy or easy bruising Neurological:Denies numbness, tingling or new weaknesses, (+) dizziness, tingling R side of head Behavioral/Psych: Mood is stable, no new changes  All other systems were reviewed with the patient and are negative.  PHYSICAL EXAMINATION: ECOG PERFORMANCE STATUS: 1 - Symptomatic but completely ambulatory   BP 109/72 (BP Location: Left Arm, Patient Position: Sitting)   Pulse 72   Temp 98.6 F (37 C) (Oral)   Resp 18   Ht '5\' 6"'$  (1.676 m)   Wt 130 lb (59 kg)   SpO2 100%   BMI 20.98 kg/m  GENERAL:alert, no distress and comfortable SKIN: skin color, texture, turgor are normal, no rashes or significant lesions, several  hematomas on extremities EYES: normal, conjunctiva are pink and non-injected, sclera clear OROPHARYNX:no exudate, no erythema and lips, buccal mucosa NECK: supple, thyroid normal size, non-tender, without nodularity LYMPH:  no palpable lymphadenopathy in the cervical, axillary or inguinal LUNGS: clear to auscultation and percussion with normal breathing effort HEART: regular rate & rhythm and no murmurs and no lower extremity edema ABDOMEN:abdomen soft, non-tender and normal bowel sounds Musculoskeletal:no cyanosis of digits and no clubbing  PSYCH: alert & oriented x 3 with fluent speech NEURO: no focal motor/sensory deficits  LABORATORY DATA:  I have reviewed the data as listed CBC Latest Ref Rng & Units 10/25/2016 10/19/2016 10/04/2016  WBC 3.9 - 10.3 10e3/uL 2.6(L) 4.2 3.0(L)  Hemoglobin 11.6 - 15.9 g/dL 9.9(L) 10.2(L) 11.0(L)  Hematocrit 34.8 - 46.6 % 28.7(L) 30.0(L) 32.6(L)  Platelets 145 - 400 10e3/uL 320 282 108(L)   CMP Latest Ref Rng & Units 10/25/2016 10/19/2016 10/04/2016  Glucose 70 - 140 mg/dl 86 106 98  BUN 7.0 - 26.0 mg/dL 8.0 5.2(L) 6.7(L)  Creatinine 0.6 - 1.1 mg/dL 0.6 0.6 0.7  Sodium 136 - 145 mEq/L 137 140 140  Potassium 3.5 - 5.1 mEq/L 3.9 3.6 4.2  Chloride 101 - 111 mmol/L - - -  CO2 22 - 29 mEq/L '25 24 25  '$ Calcium 8.4 - 10.4 mg/dL 9.2 8.8 9.6  Total Protein 6.4 - 8.3 g/dL 6.5 6.3(L) 7.0  Total Bilirubin 0.20 - 1.20 mg/dL 0.49 <0.22 0.28  Alkaline Phos 40 - 150 U/L 39(L) 44 41  AST 5 - 34 U/L '16 18 17  '$ ALT 0 - 55 U/L '19 21 21   '$ ANC 1.0 today  CA19.9 (0-35U/ml) 07/28/2016: 36 08/18/2016: 51 09/13/2016: 56 10/19/2016: 43  PATHOLOGY REPORT  Diagnosis 07/07/2016 FINE NEEDLE ASPIRATION, ENDOSCOPIC, LIVER (SPECIMEN 2 OF 2 COLLECTED 07/07/16): MALIGNANT CELLS CONSISTENT WITH ADENOCARCINOMA.  ADDITIONAL INFORMATION: Per request, immunohistochemistry was performed. The cells are positive for cytokeratin 7 and negative for cytokeratin 20 and CDX-2. This excludes a  colorectal primary. Pancreaticobiliary and upper GI sources remain in the differential. Called to Dr. Burr Medico on 07/28/2016.  Diagnosis 07/07/2016 FINE NEEDLE ASPIRATION, ENDOSCOPIC, GASTRO-HEPATIC LYMPH NODE (SPECIMEN 1 OF 2 COLLECTED 07/07/16): MALIGNANT CELLS CONSISTENT WITH ADENOCARCINOMA, SEE COMMENT. Preliminary Diagnosis Intraoperative Diagnosis: Few atypical glandular clusters - recommend additional material. 4) Adequate ([facility])  RADIOGRAPHIC STUDIES: I have personally reviewed the radiological images as listed and agreed with the findings in the report. Ct Chest W Contrast  Result Date: 10/19/2016 CLINICAL DATA:  History of gallbladder cancer. EXAM: CT CHEST, ABDOMEN, AND PELVIS WITH CONTRAST TECHNIQUE: Multidetector CT imaging of the chest, abdomen and pelvis was performed following the standard protocol during bolus administration of intravenous contrast. CONTRAST:  122m ISOVUE-300 IOPAMIDOL (ISOVUE-300) INJECTION 61% COMPARISON:  07/15/2016 FINDINGS: CT CHEST FINDINGS Cardiovascular: Normal heart size. There is no pericardial effusion. Mediastinum/Nodes: The trachea appears patent and is midline. Normal appearance of the esophagus. No enlarged mediastinal or hilar lymph nodes. Lungs/Pleura: There is no pleural fluid. No airspace consolidation or atelectasis. The index nodule within the left upper lobe measures 4 mm, image 55 of series 6. Previously 5 mm. Right upper lobe index nodule measures 4 mm, image 75 of series 6. Previously 6 mm. No new pulmonary nodules identified. Musculoskeletal: No chest wall mass or suspicious bone lesions identified. CT ABDOMEN PELVIS FINDINGS Hepatobiliary: Scattered liver metastases. The dominant lesion within the caudate  lobe measures 2.6 cm, image 57 of series 2. Previously this measured the same. Within the right lobe measures 1.2 cm, image 59 of series 2. On the previous exam this measured 0.8 cm. Lateral segment of left lobe of liver index lesion measures 0.4  cm, image 53 of series 2. Previously 0.6 cm. In the inferior segment of the right lobe of liver lesion measures 0.9 cm, image number 70 of series 2. Previously this measured the same. Pancreas: Unremarkable. No pancreatic ductal dilatation or surrounding inflammatory changes. Spleen: Normal in size without focal abnormality. Adrenals/Urinary Tract: Adrenal glands are unremarkable. Kidneys are normal, without renal calculi, focal lesion, or hydronephrosis. Bladder is unremarkable. Stomach/Bowel: The stomach appears normal. The small bowel loops have a normal course and caliber without evidence for bowel obstruction. Unremarkable appearance of the colon. Vascular/Lymphatic: Aortic atherosclerosis. No aneurysm. Necrotic portacaval node measures 2.1 cm, image number 60 of series 2. Previously 2.4 cm. Gastrohepatic ligament node measures 1.6 cm, image 58 of series 2. Previously 1.6 cm. Reproductive: Partially calcified fibroid uterus is similar to previous exam. Other: No abdominal wall hernia or abnormality. No abdominopelvic ascites. Musculoskeletal: Degenerative disc disease noted within the lumbar spine. IMPRESSION: 1. Again noted are multifocal liver metastasis. Overall there has been no significant interval change in overall volume of liver metastases. 2. Similar appearance of upper abdominal adenopathy 3. Slight decrease in size of small pulmonary metastases. Electronically Signed   By: Kerby Moors M.D.   On: 10/19/2016 08:58   Ct Abdomen Pelvis W Contrast  Result Date: 10/19/2016 CLINICAL DATA:  History of gallbladder cancer. EXAM: CT CHEST, ABDOMEN, AND PELVIS WITH CONTRAST TECHNIQUE: Multidetector CT imaging of the chest, abdomen and pelvis was performed following the standard protocol during bolus administration of intravenous contrast. CONTRAST:  136m ISOVUE-300 IOPAMIDOL (ISOVUE-300) INJECTION 61% COMPARISON:  07/15/2016 FINDINGS: CT CHEST FINDINGS Cardiovascular: Normal heart size. There is no  pericardial effusion. Mediastinum/Nodes: The trachea appears patent and is midline. Normal appearance of the esophagus. No enlarged mediastinal or hilar lymph nodes. Lungs/Pleura: There is no pleural fluid. No airspace consolidation or atelectasis. The index nodule within the left upper lobe measures 4 mm, image 55 of series 6. Previously 5 mm. Right upper lobe index nodule measures 4 mm, image 75 of series 6. Previously 6 mm. No new pulmonary nodules identified. Musculoskeletal: No chest wall mass or suspicious bone lesions identified. CT ABDOMEN PELVIS FINDINGS Hepatobiliary: Scattered liver metastases. The dominant lesion within the caudate lobe measures 2.6 cm, image 57 of series 2. Previously this measured the same. Within the right lobe measures 1.2 cm, image 59 of series 2. On the previous exam this measured 0.8 cm. Lateral segment of left lobe of liver index lesion measures 0.4 cm, image 53 of series 2. Previously 0.6 cm. In the inferior segment of the right lobe of liver lesion measures 0.9 cm, image number 70 of series 2. Previously this measured the same. Pancreas: Unremarkable. No pancreatic ductal dilatation or surrounding inflammatory changes. Spleen: Normal in size without focal abnormality. Adrenals/Urinary Tract: Adrenal glands are unremarkable. Kidneys are normal, without renal calculi, focal lesion, or hydronephrosis. Bladder is unremarkable. Stomach/Bowel: The stomach appears normal. The small bowel loops have a normal course and caliber without evidence for bowel obstruction. Unremarkable appearance of the colon. Vascular/Lymphatic: Aortic atherosclerosis. No aneurysm. Necrotic portacaval node measures 2.1 cm, image number 60 of series 2. Previously 2.4 cm. Gastrohepatic ligament node measures 1.6 cm, image 58 of series 2. Previously 1.6 cm. Reproductive: Partially  calcified fibroid uterus is similar to previous exam. Other: No abdominal wall hernia or abnormality. No abdominopelvic ascites.  Musculoskeletal: Degenerative disc disease noted within the lumbar spine. IMPRESSION: 1. Again noted are multifocal liver metastasis. Overall there has been no significant interval change in overall volume of liver metastases. 2. Similar appearance of upper abdominal adenopathy 3. Slight decrease in size of small pulmonary metastases. Electronically Signed   By: Kerby Moors M.D.   On: 10/19/2016 08:58   Upper EUS 07/07/2016 Dr. Ardis Hughs  - Endoscopically normal UGI tract (good views with standard gastroscope) - Vaguely bordered 4cm mass in the caudate lobe of liver that is confluent with what appear to be enlarged gastrohepatic lymphnodes (these may represent direct tumor extension however). Preliminary cytology from the lymphnodes shows malignancy, glandular. Preliminary cytology from the liver mass shows the same. Await final cytology results but this is most suspicious for peripheral intrahepatic cholangiocarcinoma with adjacent malignant adenopathy.  COLONOSCOPY 05/22/2014 ENDOSCOPIC IMPRESSION: 1. Sessile polyp measuring 4 mm in size was found in the sigmoid colon; polypectomy was performed with a cold snare 2. The colon mucosa was otherwise normal  ASSESSMENT & PLAN:  51 y.o. African-American female, without significant past medical history, presented with abdominal pain  1. Metastatic intrahepatic cholangiocarcinoma, with probable lung metastasis, cT2bN1M1, stage IV -I previously reviewed her CT, MRI, endoscopy findings and her biopsy results with patient and her family members in details. -We have previously reviewed her case in our GI tumor board. Based on the scans and endoscopy findings, this is most consistent with cholangiocarcinoma. Her liver and inguinal biopsy showed adenocarcinoma, IHC was consistent with pancreatic-biliary primary -She has 2 additional small liver lesions and 2 4-34m lung lesions, which are indeterminate but that is suspicious for metastatic disease. -She was  seen by surgeon Dr. BBarry Dieneswho thinks she is a not a candidate for surgical resection due to her metastatic disease. -We previously discussed her cancer is incurable at this stage, the goal of therapy is palliation, to prolong life and improve her quality life -she is on first-line cisplatin and gemcitabine, on day 1, 8 every 21 days.  -She has been tolerating chemotherapy moderately well, but had significant neutropenia and thrombocytopenia, cycle 2 chemotherapy was dose reduced -I reviewed her restaging CT scan from 10/19/2016, which showed stable disease overall, no other new lesions. She is clinically doing well, her abdominal pain has improved, we'll continue chemotherapy with cisplatin and gemcitabine. -lab reviewed with her, mild neutropenia, OK to treat, we'll proceed cycle 4 day 8 chemotherapy today -She will get Neulasta tomorrow  2. Abdominal pain, improving -continue hydrocodone/acetaminophen every 4-6 hours as needed for pain control  -Her pain has nearly resolved after she started chemotherapy, she noticed mild left-sided pain in the past few days, -Constipation and management reviewed with patient -I have recommended her to try TUMS  3. Nausea and anorexia and weight loss  -We reviewed her nausea management, she will continue Compazine and Zofran -I previously encouraged her to take a nutritional supplement -We previously discussed appetite stimulant, she is on Marinol, dose was increased to 5 mg twice daily last week, appetite improved, will continue. -she will f/u with dietician  -Previously encouraged her to have a high calorie, high protein diet. -Appetite has been good  4. Neutropenia  -Secondary to chemotherapy, she is on Neulasta after day 8 chemotherapy. -Continue monitoring  5. Goal of care discussion  -We previously discussed the incurable nature of her cancer, and the overall poor prognosis,  especially if she does not have good response to chemotherapy or progress  on chemo -The patient understands the goal of care is palliative. -I have recommended DNR/DNI, she will think about it   Plan -Scan reviewed, stable disease.  -Lab reviewed, ANC 1.0, adequate for treatment, we'll proceed with C4D8 Cisplatin / Gemcitabine today. Neulasta on day 9 -I will see her back in 2 weeks for follow up and chemo    All questions were answered. The patient knows to call the clinic with any problems, questions or concerns.  I spent 20 minutes counseling the patient face to face. The total time spent in the appointment was 25 minutes and more than 50% was on counseling.  This document serves as a record of services personally performed by Truitt Merle, MD. It was created on her behalf by Martinique Casey, a trained medical scribe. The creation of this record is based on the scribe's personal observations and the provider's statements to them. This document has been checked and approved by the attending provider.  I have reviewed the above documentation for accuracy and completeness and I agree with the above.   Truitt Merle, MD 10/25/2016

## 2016-10-25 ENCOUNTER — Ambulatory Visit (HOSPITAL_BASED_OUTPATIENT_CLINIC_OR_DEPARTMENT_OTHER): Payer: Federal, State, Local not specified - PPO | Admitting: Hematology

## 2016-10-25 ENCOUNTER — Encounter: Payer: Self-pay | Admitting: Hematology

## 2016-10-25 ENCOUNTER — Other Ambulatory Visit (HOSPITAL_BASED_OUTPATIENT_CLINIC_OR_DEPARTMENT_OTHER): Payer: Federal, State, Local not specified - PPO

## 2016-10-25 ENCOUNTER — Ambulatory Visit (HOSPITAL_BASED_OUTPATIENT_CLINIC_OR_DEPARTMENT_OTHER): Payer: Federal, State, Local not specified - PPO

## 2016-10-25 ENCOUNTER — Ambulatory Visit: Payer: Federal, State, Local not specified - PPO

## 2016-10-25 VITALS — BP 109/72 | HR 72 | Temp 98.6°F | Resp 18 | Ht 66.0 in | Wt 130.0 lb

## 2016-10-25 DIAGNOSIS — C7801 Secondary malignant neoplasm of right lung: Principal | ICD-10-CM

## 2016-10-25 DIAGNOSIS — J984 Other disorders of lung: Secondary | ICD-10-CM

## 2016-10-25 DIAGNOSIS — D701 Agranulocytosis secondary to cancer chemotherapy: Secondary | ICD-10-CM | POA: Diagnosis not present

## 2016-10-25 DIAGNOSIS — C221 Intrahepatic bile duct carcinoma: Secondary | ICD-10-CM | POA: Diagnosis not present

## 2016-10-25 DIAGNOSIS — R630 Anorexia: Secondary | ICD-10-CM | POA: Diagnosis not present

## 2016-10-25 DIAGNOSIS — Z5111 Encounter for antineoplastic chemotherapy: Secondary | ICD-10-CM

## 2016-10-25 DIAGNOSIS — R109 Unspecified abdominal pain: Secondary | ICD-10-CM

## 2016-10-25 DIAGNOSIS — R634 Abnormal weight loss: Secondary | ICD-10-CM

## 2016-10-25 DIAGNOSIS — R110 Nausea: Secondary | ICD-10-CM | POA: Diagnosis not present

## 2016-10-25 LAB — CBC WITH DIFFERENTIAL/PLATELET
BASO%: 1.1 % (ref 0.0–2.0)
Basophils Absolute: 0 10*3/uL (ref 0.0–0.1)
EOS%: 0.3 % (ref 0.0–7.0)
Eosinophils Absolute: 0 10*3/uL (ref 0.0–0.5)
HCT: 28.7 % — ABNORMAL LOW (ref 34.8–46.6)
HGB: 9.9 g/dL — ABNORMAL LOW (ref 11.6–15.9)
LYMPH%: 58.9 % — ABNORMAL HIGH (ref 14.0–49.7)
MCH: 30.3 pg (ref 25.1–34.0)
MCHC: 34.5 g/dL (ref 31.5–36.0)
MCV: 87.7 fL (ref 79.5–101.0)
MONO#: 0.1 10*3/uL (ref 0.1–0.9)
MONO%: 2 % (ref 0.0–14.0)
NEUT#: 1 10*3/uL — ABNORMAL LOW (ref 1.5–6.5)
NEUT%: 37.7 % — ABNORMAL LOW (ref 38.4–76.8)
Platelets: 320 10*3/uL (ref 145–400)
RBC: 3.27 10*6/uL — ABNORMAL LOW (ref 3.70–5.45)
RDW: 17.7 % — ABNORMAL HIGH (ref 11.2–14.5)
WBC: 2.6 10*3/uL — ABNORMAL LOW (ref 3.9–10.3)
lymph#: 1.5 10*3/uL (ref 0.9–3.3)

## 2016-10-25 LAB — COMPREHENSIVE METABOLIC PANEL
ALT: 19 U/L (ref 0–55)
AST: 16 U/L (ref 5–34)
Albumin: 4 g/dL (ref 3.5–5.0)
Alkaline Phosphatase: 39 U/L — ABNORMAL LOW (ref 40–150)
Anion Gap: 8 mEq/L (ref 3–11)
BUN: 8 mg/dL (ref 7.0–26.0)
CO2: 25 mEq/L (ref 22–29)
Calcium: 9.2 mg/dL (ref 8.4–10.4)
Chloride: 104 mEq/L (ref 98–109)
Creatinine: 0.6 mg/dL (ref 0.6–1.1)
EGFR: >90 mL/min/{1.73_m2} (ref >90)
Glucose: 86 mg/dl (ref 70–140)
Potassium: 3.9 mEq/L (ref 3.5–5.1)
Sodium: 137 mEq/L (ref 136–145)
Total Bilirubin: 0.49 mg/dL (ref 0.20–1.20)
Total Protein: 6.5 g/dL (ref 6.4–8.3)

## 2016-10-25 MED ORDER — SODIUM CHLORIDE 0.9 % IV SOLN
800.0000 mg/m2 | Freq: Once | INTRAVENOUS | Status: AC
  Administered 2016-10-25: 1368 mg via INTRAVENOUS
  Filled 2016-10-25: qty 35.98

## 2016-10-25 MED ORDER — PALONOSETRON HCL INJECTION 0.25 MG/5ML
0.2500 mg | Freq: Once | INTRAVENOUS | Status: AC
  Administered 2016-10-25: 0.25 mg via INTRAVENOUS

## 2016-10-25 MED ORDER — POTASSIUM CHLORIDE 2 MEQ/ML IV SOLN
Freq: Once | INTRAVENOUS | Status: AC
  Administered 2016-10-25: 10:00:00 via INTRAVENOUS
  Filled 2016-10-25: qty 10

## 2016-10-25 MED ORDER — SODIUM CHLORIDE 0.9 % IV SOLN
Freq: Once | INTRAVENOUS | Status: AC
  Administered 2016-10-25: 10:00:00 via INTRAVENOUS

## 2016-10-25 MED ORDER — HEPARIN SOD (PORK) LOCK FLUSH 100 UNIT/ML IV SOLN
500.0000 [IU] | Freq: Once | INTRAVENOUS | Status: AC | PRN
  Administered 2016-10-25: 500 [IU]
  Filled 2016-10-25: qty 5

## 2016-10-25 MED ORDER — SODIUM CHLORIDE 0.9% FLUSH
10.0000 mL | INTRAVENOUS | Status: DC | PRN
  Administered 2016-10-25: 10 mL
  Filled 2016-10-25: qty 10

## 2016-10-25 MED ORDER — FOSAPREPITANT DIMEGLUMINE INJECTION 150 MG
Freq: Once | INTRAVENOUS | Status: AC
  Administered 2016-10-25: 12:00:00 via INTRAVENOUS
  Filled 2016-10-25: qty 5

## 2016-10-25 MED ORDER — PALONOSETRON HCL INJECTION 0.25 MG/5ML
INTRAVENOUS | Status: AC
  Filled 2016-10-25: qty 5

## 2016-10-25 MED ORDER — SODIUM CHLORIDE 0.9 % IV SOLN
25.0000 mg/m2 | Freq: Once | INTRAVENOUS | Status: AC
  Administered 2016-10-25: 43 mg via INTRAVENOUS
  Filled 2016-10-25: qty 43

## 2016-10-25 NOTE — Progress Notes (Signed)
OK to treat with ANC  1.0  .   Pt to receive Neulasta tomorrow 10/26/16.

## 2016-10-25 NOTE — Patient Instructions (Signed)

## 2016-10-25 NOTE — Patient Instructions (Signed)
[facility] Discharge Instructions for Patients Receiving Chemotherapy  Today you received the following chemotherapy agents:  Gemzar, Cisplatin  To help prevent nausea and vomiting after your treatment, we encourage you to take your nausea medication as prescribed.   If you develop nausea and vomiting that is not controlled by your nausea medication, call the clinic.   BELOW ARE SYMPTOMS THAT SHOULD BE REPORTED IMMEDIATELY:  *FEVER GREATER THAN 100.5 F  *CHILLS WITH OR WITHOUT FEVER  NAUSEA AND VOMITING THAT IS NOT CONTROLLED WITH YOUR NAUSEA MEDICATION  *UNUSUAL SHORTNESS OF BREATH  *UNUSUAL BRUISING OR BLEEDING  TENDERNESS IN MOUTH AND THROAT WITH OR WITHOUT PRESENCE OF ULCERS  *URINARY PROBLEMS  *BOWEL PROBLEMS  UNUSUAL RASH Items with * indicate a potential emergency and should be followed up as soon as possible.  Feel free to call the clinic you have any questions or concerns. The clinic phone number is [phone].  Please show the [facility] at check-in to the Emergency Department and triage nurse.

## 2016-10-26 ENCOUNTER — Ambulatory Visit (HOSPITAL_BASED_OUTPATIENT_CLINIC_OR_DEPARTMENT_OTHER): Payer: Federal, State, Local not specified - PPO

## 2016-10-26 VITALS — BP 111/62 | HR 77 | Temp 98.5°F | Resp 18

## 2016-10-26 DIAGNOSIS — C7801 Secondary malignant neoplasm of right lung: Principal | ICD-10-CM

## 2016-10-26 DIAGNOSIS — C221 Intrahepatic bile duct carcinoma: Secondary | ICD-10-CM | POA: Diagnosis not present

## 2016-10-26 DIAGNOSIS — D701 Agranulocytosis secondary to cancer chemotherapy: Secondary | ICD-10-CM

## 2016-10-26 MED ORDER — PEGFILGRASTIM INJECTION 6 MG/0.6ML ~~LOC~~
6.0000 mg | PREFILLED_SYRINGE | Freq: Once | SUBCUTANEOUS | Status: AC
  Administered 2016-10-26: 6 mg via SUBCUTANEOUS
  Filled 2016-10-26: qty 0.6

## 2016-10-26 NOTE — Patient Instructions (Signed)
Pegfilgrastim injection What is this medicine? PEGFILGRASTIM (PEG fil gra stim) is a long-acting granulocyte colony-stimulating factor that stimulates the growth of neutrophils, a type of white blood cell important in the body's fight against infection. It is used to reduce the incidence of fever and infection in patients with certain types of cancer who are receiving chemotherapy that affects the bone marrow, and to increase survival after being exposed to high doses of radiation. This medicine may be used for other purposes; ask your health care provider or pharmacist if you have questions. COMMON BRAND NAME(S): Neulasta What should I tell my health care provider before I take this medicine? They need to know if you have any of these conditions: -kidney disease -latex allergy -ongoing radiation therapy -sickle cell disease -skin reactions to acrylic adhesives (On-Body Injector only) -an unusual or allergic reaction to pegfilgrastim, filgrastim, other medicines, foods, dyes, or preservatives -pregnant or trying to get pregnant -breast-feeding How should I use this medicine? This medicine is for injection under the skin. If you get this medicine at home, you will be taught how to prepare and give the pre-filled syringe or how to use the On-body Injector. Refer to the patient Instructions for Use for detailed instructions. Use exactly as directed. Take your medicine at regular intervals. Do not take your medicine more often than directed. It is important that you put your used needles and syringes in a special sharps container. Do not put them in a trash can. If you do not have a sharps container, call your pharmacist or healthcare provider to get one. Talk to your pediatrician regarding the use of this medicine in children. While this drug may be prescribed for selected conditions, precautions do apply. Overdosage: If you think you have taken too much of this medicine contact a poison control  center or emergency room at once. NOTE: This medicine is only for you. Do not share this medicine with others. What if I miss a dose? It is important not to miss your dose. Call your doctor or health care professional if you miss your dose. If you miss a dose due to an On-body Injector failure or leakage, a new dose should be administered as soon as possible using a single prefilled syringe for manual use. What may interact with this medicine? Interactions have not been studied. Give your health care provider a list of all the medicines, herbs, non-prescription drugs, or dietary supplements you use. Also tell them if you smoke, drink alcohol, or use illegal drugs. Some items may interact with your medicine. This list may not describe all possible interactions. Give your health care provider a list of all the medicines, herbs, non-prescription drugs, or dietary supplements you use. Also tell them if you smoke, drink alcohol, or use illegal drugs. Some items may interact with your medicine. What should I watch for while using this medicine? You may need blood work done while you are taking this medicine. If you are going to need a MRI, CT scan, or other procedure, tell your doctor that you are using this medicine (On-Body Injector only). What side effects may I notice from receiving this medicine? Side effects that you should report to your doctor or health care professional as soon as possible: -allergic reactions like skin rash, itching or hives, swelling of the face, lips, or tongue -dizziness -fever -pain, redness, or irritation at site where injected -pinpoint red spots on the skin -red or dark-brown urine -shortness of breath or breathing problems -stomach or   side pain, or pain at the shoulder -swelling -tiredness -trouble passing urine or change in the amount of urine Side effects that usually do not require medical attention (report to your doctor or health care professional if they  continue or are bothersome): -bone pain -muscle pain This list may not describe all possible side effects. Call your doctor for medical advice about side effects. You may report side effects to FDA at 1-800-FDA-1088. Where should I keep my medicine? Keep out of the reach of children. Store pre-filled syringes in a refrigerator between 2 and 8 degrees C (36 and 46 degrees F). Do not freeze. Keep in carton to protect from light. Throw away this medicine if it is left out of the refrigerator for more than 48 hours. Throw away any unused medicine after the expiration date. NOTE: This sheet is a summary. It may not cover all possible information. If you have questions about this medicine, talk to your doctor, pharmacist, or health care provider.  2017 [employer] (2014-09-05 14:30:14)  

## 2016-10-29 ENCOUNTER — Encounter: Payer: Self-pay | Admitting: *Deleted

## 2016-10-29 NOTE — Progress Notes (Signed)
[facility]  Clinical Social Work was referred by patient for possible support and assessment of psychosocial needs.  Clinical Social Worker contacted patient at home to offer support and assess for needs. CSW explained role of CSW, options for support. Pt described personal emotional adjustment to being sick, relying on caregivers and coping with her illness. CSW suggested pt meet with CSW one on one for initial assessment. Pt plans to return to center on 11/01/16 to meet with Vernie Shanks, CSW for initial intake/counseling support. Pt had shared some caregiver concerns and CSW explained it may be helpful to use other counselors for those issues separate from her own supportive relationship. CSW team to follow.       Loren Racer, LCSW, OSW-C Clinical Social Worker [place]  [place] Phone: [phone] Fax: [phone]

## 2016-11-01 ENCOUNTER — Telehealth: Payer: Self-pay | Admitting: *Deleted

## 2016-11-01 ENCOUNTER — Encounter: Payer: Self-pay | Admitting: Hematology

## 2016-11-01 NOTE — Telephone Encounter (Signed)
Pt called and left message re:  Pt would like a letter updating her condition and/or progress for work.  Information needed will be :  How long will pt be out of work, how much longer for the treatments, and estimated time to return to work - maybe half a day working. Pt's     Phone     [phone].

## 2016-11-01 NOTE — Telephone Encounter (Signed)
Let her know the letter is ready for her. Thanks.   Truitt Merle MD

## 2016-11-01 NOTE — Progress Notes (Signed)
[place]  [phone] [phone]  Clinic Follow Up Note   Patient Care Team: [name], DO as PCP - General Jerene Bears, MD as Consulting Physician (Gastroenterology) 11/09/2016  CHIEF COMPLAINTS:  Follow up metastatic intrahepatic cholangiocarcinoma  Oncology History   Metastatic cholangiocarcinoma to lung [facility])   Staging form: Perihilar Bile Ducts, AJCC 7th Edition   - Clinical stage from 07/07/2016: Stage IVB (T2b, N1, M1) - Signed by Truitt Merle, MD on 07/28/2016      Metastatic cholangiocarcinoma to lung ([place])   05/22/2014 Procedure    Routine screening colonoscopy showed a sessile polyp measuring 4 mm in the sigmoid colon, removed, otherwise negative.      06/29/2016 Imaging    CT chest, abdomen and pelvis showed 2 indeterminant nodule in left and the right lung, 4-84m, indeterminate a hypovascular liver lesions largest in the caudate lobe measuring 2.6 cm, mild abdominal lymphadenopathy in the gastrohepatic ligament and portocaval space.      07/02/2016 Imaging    Abdominal MRI with and without contrast showed 2 lobular lesions in the caudate lobe, and is regular lymph nodes in the gastrohepatic ligament which is adjacent to the liver lesion, suspicious for malignancy. 2 small lesions in the right hepatic lobe are indeterminate, but concerning for metastasis.      07/07/2016 Procedure    EGD was negative, EUS biopsy of the liver and adjacent lymph node      07/07/2016 Initial Diagnosis    [place]      07/07/2016 Initial Biopsy    Fine-needle aspiration of the liver lesion in caudate lobe and adjacent lymph nodes both showed metastatic adenocarcinoma.      08/11/2016 -  Chemotherapy    Cisplatin 25 mg/m, gemcitabine 1000 mg/m, on day one and 8, every 21 days        10/18/2016 Imaging    CT CAP  IMPRESSION: 1. Again noted are multifocal liver metastasis. Overall there has been no  significant interval change in overall volume of liver metastases. 2. Similar appearance of upper abdominal adenopathy 3. Slight decrease in size of small pulmonary metastases.       HISTORY OF PRESENTING ILLNESS:  Nancy Fahs89y.o. female is here because of Her recently diagnosed metastatic glandular carcinoma. She is accompanied by her husband and mother to my clinic today.  She has been having RUQ abdominal pain since mid 04/2016, she was seen by PCP and was treated for gas with GI cocktail and pepcide, which she did not help much. Her pain got worse, and radiates to right shoulder, she denies significant nausea, or bloating, BM normal, no fever, cough or dyspnea. She recently noticed mild chest discomfort in the low sternum area. She initially had the lab, ultrasound done by her primary care physician, which was unrevealing. She was referred to GI Dr. PHilarie Fredricksonand underwent EGD and CT scan, which showed a lobulated mass in the caudate lobe of liver, with at adjacent adenopathy. He underwent EUS and fine-needle biopsy of the liver mass and lymph nodes, all reviewed adenocarcinoma.  She has lost about 7lbs in the past 3 months. She has mild fatigue, but able to function at home. She has stopped working due to her abdominal pain and fatigue. She takes Norco every 1-3 times a day, and her pain seems not well controlled.  CURRENT THERAPY: Cisplatin and gemcitabine on Day 1, 8 every 21 days, with neulasta on day 9, Gemcitabine  dosed reduced to 800 mg/m due to cytopenias.  INTERIM HISTORY: Riana returns for follow up with a family member. The patient states she feels good after he cycle of chemotherapy. She has a good appetite with Marinol and the patient has gained 4.5lbs back. She reports minor bruising in various parts of her body she believes from bumping into objects. She reports some bone pain that is better now than before. She is taking Dicyclomine TID for abdominal cramps. She reports  normal bowel movements.  MEDICAL HISTORY:  Past Medical History:  Diagnosis Date  . Allergy    SEASONAL  . Anemia    HIGH SCHOOL  . Atopic eczema   . Fibrocystic breast disease   . GERD (gastroesophageal reflux disease)   . Hx of migraines    seasonal   . Pneumonia    6 years ago   . TIA (transient ischemic attack)     SURGICAL HISTORY: Past Surgical History:  Procedure Laterality Date  . EUS N/A 07/07/2016   Procedure: UPPER ENDOSCOPIC ULTRASOUND (EUS) RADIAL;  Surgeon: [name], MD;  Location: [facility] ENDOSCOPY;  Service: Endoscopy;  Laterality: N/A;  . IR GENERIC HISTORICAL  08/03/2016   IR US GUIDE VASC ACCESS RIGHT 08/03/2016 Corrie Mckusick, DO [facility]-INTER[facility] RAD  . IR GENERIC HISTORICAL  08/03/2016   IR FLUORO GUIDE PORT INSERTION RIGHT 08/03/2016 Corrie Mckusick, DO [facility]  . MYOMECTOMY  2002    SOCIAL HISTORY: Social History   Social History  . Marital status: Married    Spouse name: N/A  . Number of children: 0  . Years of education: 55   Occupational History  . [employer]  .  [employer]   Social History Main Topics  . Smoking status: Never Smoker  . Smokeless tobacco: Never Used  . Alcohol use Yes     Comment: maybe 3 times a year  . Drug use: No  . Sexual activity: Yes    Partners: Male   Other Topics Concern  . Not on file   Social History Narrative  . No narrative on file    FAMILY HISTORY: Family History  Problem Relation Age of Onset  . Gout Mother   . Diabetes Mother   . Hypertension Father   . Diabetes Father   . Breast cancer Paternal Aunt   . Cancer Maternal Grandfather     throat cancer   . Cancer Cousin 24    GI cancer   . Thyroid disease Neg Hx     ALLERGIES:  is allergic to penicillins and lipitor [atorvastatin].  MEDICATIONS:  Current Outpatient Prescriptions  Medication Sig Dispense Refill  . ALPRAZolam (XANAX) 0.5 MG tablet Take 1 tablet (0.5 mg total) by mouth as needed for anxiety. 10 tablet 0  . aspirin 81  MG tablet Take 81 mg by mouth daily.    Marland Kitchen CARAFATE 1 GM/10ML suspension TAKE 10 MLS (1 G TOTAL) BY MOUTH 4 (FOUR) TIMES DAILY - WITH MEALS AND AT BEDTIME. 420 mL 1  . cetirizine (ZYRTEC) 10 MG tablet Take 10 mg by mouth daily as needed. For allergies     . Cholecalciferol (VITAMIN D3) 5000 UNITS CAPS Take 1 capsule by mouth daily.    . Ciclopirox 1 % shampoo Use as directed 2x a week for up to 4 weeks 120 mL 1  . COLOSTRUM PO Take 1.25 g by mouth 2 (two) times daily. 1.25 grams twice daily    . dicyclomine (BENTYL) 20 MG  tablet 3 (three) times daily as needed.     . dronabinol (MARINOL) 5 MG capsule Take 1 capsule (5 mg total) by mouth 2 (two) times daily before a meal. 60 capsule 1  . lidocaine-prilocaine (EMLA) cream Apply 1 application topically as needed. Apply to portacath 1 1/2 hours - 2 hours prior to procedures as needed. 30 g 1  . meclizine (ANTIVERT) 50 MG tablet Take 1 tablet (50 mg total) by mouth 3 (three) times daily as needed. 30 tablet 0  . Menaquinone-7 (VITAMIN K2) 40 MCG TABS Take by mouth.    . mometasone (ELOCON) 0.1 % lotion Apply topically daily. 60 mL 0  . NON FORMULARY 1 capsule 1 day or 1 dose. Pregnelone, pt reports she takes daily.    . Nutritional Supplements (DHEA) 15-50 MG CAPS Take 1 capsule by mouth 3 (three) times a week.    . nystatin (MYCOSTATIN) 100000 UNIT/ML suspension Take 5 mLs (500,000 Units total) by mouth 4 (four) times daily. 60 mL 0  . omeprazole (PRILOSEC) 40 MG capsule Take 1 capsule (40 mg total) by mouth 2 (two) times daily before a meal. 60 capsule 11  . ondansetron (ZOFRAN) 8 MG tablet Take 1 tablet (8 mg total) by mouth every 8 (eight) hours as needed for nausea or vomiting. 20 tablet 1  . Prenatal Vit-Fe Fumarate-FA (PRENATAL MULTIVITAMIN) TABS tablet Take 2 tablets by mouth daily at 12 noon.    . triamcinolone cream (KENALOG) 0.1 % Apply topically.    Marland Kitchen VITAMIN K PO Take 1 capsule by mouth daily.    Marland Kitchen HYDROcodone-acetaminophen (NORCO) 5-325  MG tablet Take 1-2 tablets by mouth every 6 (six) hours as needed for severe pain. (Patient not taking: Reported on 11/09/2016) 60 tablet 0   No current facility-administered medications for this visit.    Facility-Administered Medications Ordered in Other Visits  Medication Dose Route Frequency Provider Last Rate Last Dose  . alteplase (CATHFLO ACTIVASE) injection 2 mg  2 mg Intracatheter Once PRN Truitt Merle, MD      . CISplatin (PLATINOL) 43 mg in sodium chloride 0.9 % 250 mL chemo infusion  25 mg/m2 (Treatment Plan Recorded) Intravenous Once Truitt Merle, MD      . gemcitabine (GEMZAR) 1,368 mg in sodium chloride 0.9 % 250 mL chemo infusion  800 mg/m2 (Treatment Plan Recorded) Intravenous Once Truitt Merle, MD      . heparin lock flush 100 unit/mL  250 Units Intracatheter Once PRN Truitt Merle, MD      . heparin lock flush 100 unit/mL  500 Units Intracatheter Once PRN Truitt Merle, MD      . sodium chloride flush (NS) 0.9 % injection 10 mL  10 mL Intracatheter PRN Truitt Merle, MD   10 mL at 09/09/16 1645  . sodium chloride flush (NS) 0.9 % injection 10 mL  10 mL Intracatheter PRN Truitt Merle, MD        REVIEW OF SYSTEMS:   Constitutional: Denies fevers, chills or abnormal night sweats (+) fatigue Eyes: Denies blurriness of vision, double vision or watery eyes Ears, nose, mouth, throat, and face: Denies mucositis or sore throat Respiratory: Denies cough, dyspnea or wheezes Cardiovascular: Denies palpitation, chest discomfort or lower extremity swelling Gastrointestinal:  Denies heartburn or change in bowel habits (+) nausea, abdominal pain Skin: Denies abnormal skin rashes (+) cold sore Lymphatics: Denies new lymphadenopathy or easy bruising Neurological:Denies numbness, tingling or new weaknesses, (+) dizziness, tingling R side of head Behavioral/Psych: Mood is stable, no  new changes  All other systems were reviewed with the patient and are negative.  PHYSICAL EXAMINATION: ECOG PERFORMANCE STATUS: 1 -  Symptomatic but completely ambulatory   BP 119/76 (BP Location: Left Arm, Patient Position: Sitting)   Pulse 76   Temp 98.7 F (37.1 C) (Oral)   Resp 18   Ht '5\' 6"'$  (1.676 m)   Wt 134 lb 8 oz (61 kg)   SpO2 100%   BMI 21.71 kg/m    GENERAL:alert, no distress and comfortable SKIN: skin color, texture, turgor are normal, no rashes or significant lesions, several hematomas on extremities EYES: normal, conjunctiva are pink and non-injected, sclera clear OROPHARYNX:no exudate, no erythema and lips, buccal mucosa NECK: supple, thyroid normal size, non-tender, without nodularity LYMPH:  no palpable lymphadenopathy in the cervical, axillary or inguinal LUNGS: clear to auscultation and percussion with normal breathing effort HEART: regular rate & rhythm and no murmurs and no lower extremity edema ABDOMEN:abdomen soft, non-tender and normal bowel sounds Musculoskeletal:no cyanosis of digits and no clubbing  PSYCH: alert & oriented x 3 with fluent speech NEURO: no focal motor/sensory deficits  LABORATORY DATA:  I have reviewed the data as listed CBC Latest Ref Rng & Units 11/09/2016 10/25/2016 10/19/2016  WBC 3.9 - 10.3 10e3/uL 6.8 2.6(L) 4.2  Hemoglobin 11.6 - 15.9 g/dL 10.1(L) 9.9(L) 10.2(L)  Hematocrit 34.8 - 46.6 % 30.2(L) 28.7(L) 30.0(L)  Platelets 145 - 400 10e3/uL 258 320 282   CMP Latest Ref Rng & Units 11/09/2016 10/25/2016 10/19/2016  Glucose 70 - 140 mg/dl 87 86 106  BUN 7.0 - 26.0 mg/dL 7.8 8.0 5.2(L)  Creatinine 0.6 - 1.1 mg/dL 0.7 0.6 0.6  Sodium 136 - 145 mEq/L 143 137 140  Potassium 3.5 - 5.1 mEq/L 4.1 3.9 3.6  Chloride 101 - 111 mmol/L - - -  CO2 22 - 29 mEq/L '26 25 24  '$ Calcium 8.4 - 10.4 mg/dL 8.9 9.2 8.8  Total Protein 6.4 - 8.3 g/dL 6.4 6.5 6.3(L)  Total Bilirubin 0.20 - 1.20 mg/dL <0.22 0.49 <0.22  Alkaline Phos 40 - 150 U/L 52 39(L) 44  AST 5 - 34 U/L '15 16 18  '$ ALT 0 - 55 U/L '13 19 21   '$ ANC 4.1 TODAY  CA19.9 (0-35U/ml) 07/28/2016: 36 08/18/2016: 51 09/13/2016:  56 10/19/2016: 43 11/09/2016: PENDING  PATHOLOGY REPORT  Diagnosis 07/07/2016 FINE NEEDLE ASPIRATION, ENDOSCOPIC, LIVER (SPECIMEN 2 OF 2 COLLECTED 07/07/16): MALIGNANT CELLS CONSISTENT WITH ADENOCARCINOMA.  ADDITIONAL INFORMATION: Per request, immunohistochemistry was performed. The cells are positive for cytokeratin 7 and negative for cytokeratin 20 and CDX-2. This excludes a colorectal primary. Pancreaticobiliary and upper GI sources remain in the differential. Called to Dr. Burr Medico on 07/28/2016.  Diagnosis 07/07/2016 FINE NEEDLE ASPIRATION, ENDOSCOPIC, GASTRO-HEPATIC LYMPH NODE (SPECIMEN 1 OF 2 COLLECTED 07/07/16): MALIGNANT CELLS CONSISTENT WITH ADENOCARCINOMA, SEE COMMENT. Preliminary Diagnosis Intraoperative Diagnosis: Few atypical glandular clusters - recommend additional material. 4) Adequate ([facility])  RADIOGRAPHIC STUDIES: I have personally reviewed the radiological images as listed and agreed with the findings in the report. Ct Chest W Contrast  Result Date: 10/19/2016 CLINICAL DATA:  History of gallbladder cancer. EXAM: CT CHEST, ABDOMEN, AND PELVIS WITH CONTRAST TECHNIQUE: Multidetector CT imaging of the chest, abdomen and pelvis was performed following the standard protocol during bolus administration of intravenous contrast. CONTRAST:  142m ISOVUE-300 IOPAMIDOL (ISOVUE-300) INJECTION 61% COMPARISON:  07/15/2016 FINDINGS: CT CHEST FINDINGS Cardiovascular: Normal heart size. There is no pericardial effusion. Mediastinum/Nodes: The trachea appears patent and is midline. Normal appearance of the  esophagus. No enlarged mediastinal or hilar lymph nodes. Lungs/Pleura: There is no pleural fluid. No airspace consolidation or atelectasis. The index nodule within the left upper lobe measures 4 mm, image 55 of series 6. Previously 5 mm. Right upper lobe index nodule measures 4 mm, image 75 of series 6. Previously 6 mm. No new pulmonary nodules identified. Musculoskeletal: No chest wall mass or  suspicious bone lesions identified. CT ABDOMEN PELVIS FINDINGS Hepatobiliary: Scattered liver metastases. The dominant lesion within the caudate lobe measures 2.6 cm, image 57 of series 2. Previously this measured the same. Within the right lobe measures 1.2 cm, image 59 of series 2. On the previous exam this measured 0.8 cm. Lateral segment of left lobe of liver index lesion measures 0.4 cm, image 53 of series 2. Previously 0.6 cm. In the inferior segment of the right lobe of liver lesion measures 0.9 cm, image number 70 of series 2. Previously this measured the same. Pancreas: Unremarkable. No pancreatic ductal dilatation or surrounding inflammatory changes. Spleen: Normal in size without focal abnormality. Adrenals/Urinary Tract: Adrenal glands are unremarkable. Kidneys are normal, without renal calculi, focal lesion, or hydronephrosis. Bladder is unremarkable. Stomach/Bowel: The stomach appears normal. The small bowel loops have a normal course and caliber without evidence for bowel obstruction. Unremarkable appearance of the colon. Vascular/Lymphatic: Aortic atherosclerosis. No aneurysm. Necrotic portacaval node measures 2.1 cm, image number 60 of series 2. Previously 2.4 cm. Gastrohepatic ligament node measures 1.6 cm, image 58 of series 2. Previously 1.6 cm. Reproductive: Partially calcified fibroid uterus is similar to previous exam. Other: No abdominal wall hernia or abnormality. No abdominopelvic ascites. Musculoskeletal: Degenerative disc disease noted within the lumbar spine. IMPRESSION: 1. Again noted are multifocal liver metastasis. Overall there has been no significant interval change in overall volume of liver metastases. 2. Similar appearance of upper abdominal adenopathy 3. Slight decrease in size of small pulmonary metastases. Electronically Signed   By: Kerby Moors M.D.   On: 10/19/2016 08:58   Ct Abdomen Pelvis W Contrast  Result Date: 10/19/2016 CLINICAL DATA:  History of gallbladder  cancer. EXAM: CT CHEST, ABDOMEN, AND PELVIS WITH CONTRAST TECHNIQUE: Multidetector CT imaging of the chest, abdomen and pelvis was performed following the standard protocol during bolus administration of intravenous contrast. CONTRAST:  152m ISOVUE-300 IOPAMIDOL (ISOVUE-300) INJECTION 61% COMPARISON:  07/15/2016 FINDINGS: CT CHEST FINDINGS Cardiovascular: Normal heart size. There is no pericardial effusion. Mediastinum/Nodes: The trachea appears patent and is midline. Normal appearance of the esophagus. No enlarged mediastinal or hilar lymph nodes. Lungs/Pleura: There is no pleural fluid. No airspace consolidation or atelectasis. The index nodule within the left upper lobe measures 4 mm, image 55 of series 6. Previously 5 mm. Right upper lobe index nodule measures 4 mm, image 75 of series 6. Previously 6 mm. No new pulmonary nodules identified. Musculoskeletal: No chest wall mass or suspicious bone lesions identified. CT ABDOMEN PELVIS FINDINGS Hepatobiliary: Scattered liver metastases. The dominant lesion within the caudate lobe measures 2.6 cm, image 57 of series 2. Previously this measured the same. Within the right lobe measures 1.2 cm, image 59 of series 2. On the previous exam this measured 0.8 cm. Lateral segment of left lobe of liver index lesion measures 0.4 cm, image 53 of series 2. Previously 0.6 cm. In the inferior segment of the right lobe of liver lesion measures 0.9 cm, image number 70 of series 2. Previously this measured the same. Pancreas: Unremarkable. No pancreatic ductal dilatation or surrounding inflammatory changes. Spleen: Normal in size  without focal abnormality. Adrenals/Urinary Tract: Adrenal glands are unremarkable. Kidneys are normal, without renal calculi, focal lesion, or hydronephrosis. Bladder is unremarkable. Stomach/Bowel: The stomach appears normal. The small bowel loops have a normal course and caliber without evidence for bowel obstruction. Unremarkable appearance of the colon.  Vascular/Lymphatic: Aortic atherosclerosis. No aneurysm. Necrotic portacaval node measures 2.1 cm, image number 60 of series 2. Previously 2.4 cm. Gastrohepatic ligament node measures 1.6 cm, image 58 of series 2. Previously 1.6 cm. Reproductive: Partially calcified fibroid uterus is similar to previous exam. Other: No abdominal wall hernia or abnormality. No abdominopelvic ascites. Musculoskeletal: Degenerative disc disease noted within the lumbar spine. IMPRESSION: 1. Again noted are multifocal liver metastasis. Overall there has been no significant interval change in overall volume of liver metastases. 2. Similar appearance of upper abdominal adenopathy 3. Slight decrease in size of small pulmonary metastases. Electronically Signed   By: Kerby Moors M.D.   On: 10/19/2016 08:58   CT CAP W CONTRAST 10/18/16 IMPRESSION: 1. Again noted are multifocal liver metastasis. Overall there has been no significant interval change in overall volume of liver metastases. 2. Similar appearance of upper abdominal adenopathy 3. Slight decrease in size of small pulmonary metastases.  Upper EUS 07/07/2016 Dr. Ardis Hughs  - Endoscopically normal UGI tract (good views with standard gastroscope) - Vaguely bordered 4cm mass in the caudate lobe of liver that is confluent with what appear to be enlarged gastrohepatic lymphnodes (these may represent direct tumor extension however). Preliminary cytology from the lymphnodes shows malignancy, glandular. Preliminary cytology from the liver mass shows the same. Await final cytology results but this is most suspicious for peripheral intrahepatic cholangiocarcinoma with adjacent malignant adenopathy.  COLONOSCOPY 05/22/2014 ENDOSCOPIC IMPRESSION: 1. Sessile polyp measuring 4 mm in size was found in the sigmoid colon; polypectomy was performed with a cold snare 2. The colon mucosa was otherwise normal  ASSESSMENT & PLAN:  51 y.o. African-American female, without significant  past medical history, presented with abdominal pain  1. Metastatic intrahepatic cholangiocarcinoma, with probable lung metastasis, [id number], stage IV -I previously reviewed her CT, MRI, endoscopy findings and her biopsy results with patient and her family members in details. -We have previously reviewed her case in our GI tumor board. Based on the scans and endoscopy findings, this is most consistent with cholangiocarcinoma. Her liver and inguinal biopsy showed adenocarcinoma, IHC was consistent with pancreatic-biliary primary -She has 2 additional small liver lesions and 2 4-28m lung lesions, which are indeterminate but that is suspicious for metastatic disease. -She was seen by surgeon Dr. BBarry Dieneswho thinks she is a not a candidate for surgical resection due to her metastatic disease. -We previously discussed her cancer is incurable at this stage, the goal of therapy is palliation, to prolong life and improve her quality life -she is on first-line cisplatin and gemcitabine, on day 1, 8 every 21 days.  -She has been tolerating chemotherapy moderately well, but had significant neutropenia and thrombocytopenia, cycle 2 chemotherapy was dose reduced -I previously reviewed her restaging CT scan from 10/19/2016, which showed stable disease overall, no other new lesions. She is clinically doing well, her abdominal pain has much improved. -We'll continue chemotherapy with cisplatin and gemcitabine.  -lab reviewed with her. OK to treat, we'll proceed cycle 5 day 1 chemotherapy TODAY. -She will get Neulasta day 9. -plan to repeat staging CT scan in May   2. Abdominal pain, improving -continue hydrocodone/acetaminophen every 4-6 hours as needed for pain control  -Her pain has nearly resolved  after she started chemotherapy, she noticed mild left-sided pain in the past few days, -Constipation and management reviewed with patient -I have recommended her to try TUMS  3. Nausea and anorexia and weight loss    -We reviewed her nausea management, she will continue Compazine and Zofran -I previously encouraged her to take a nutritional supplement -We previously discussed appetite stimulant, she is on Marinol, dose was increased to 5 mg twice daily last week, appetite improved, will continue. -she will f/u with dietician  -Previously encouraged her to have a high calorie, high protein diet.   4. Neutropenia  -Secondary to chemotherapy, she is on Neulasta after day 8 chemotherapy. -Continue monitoring  5. Goal of care discussion  -We previously discussed the incurable nature of her cancer, and the overall poor prognosis, especially if she does not have good response to chemotherapy or progress on chemo -The patient understands the goal of care is palliative. -I have recommended DNR/DNI, she will think about it.  Plan -Lab reviewed, ANC 4.1, adequate for treatment, we'll proceed with C5D1 Cisplatin / Gemcitabine today and D8 next week. Neulasta on day 9. -Labs, flush, and chemo cis/gem on 11/30/16 and 12/07/16. -F/u on 11/30/16 with APP and I will see her before C7 in 6 weeks    All questions were answered. The patient knows to call the clinic with any problems, questions or concerns.  I spent 20 minutes counseling the patient face to face. The total time spent in the appointment was 25 minutes and more than 50% was on counseling.    Truitt Merle, MD 11/09/2016   This document serves as a record of services personally performed by Truitt Merle, MD. It was created on her behalf by Darcus Austin, a trained medical scribe. The creation of this record is based on the scribe's personal observations and the provider's statements to them. This document has been checked and approved by the attending provider.

## 2016-11-02 ENCOUNTER — Encounter: Payer: Self-pay | Admitting: *Deleted

## 2016-11-02 NOTE — Progress Notes (Signed)
[place] Work  Holiday representative met with patient at [facility] in [place] office to discuss counseling and emotional support.  CSW and patient discussed the importance of emotional support and resources available.  Patient expressed increased personal emotional stress and stressors at home due to recent diagnosis and treatment.  CSW and patient discussed the importance of communication and tools to increase positive and effective communication.  CSW and patient discussed common feelings and concerns during treatment and after receiving a diagnosis.  CSW normalized patients feelings and offered additional support.  Patient plans to use tools identified during session with CSW at home, and will follow up with CSW at the next scheduled appointment time.  Patient stated she felt "better" after meeting with CSW and knows to call with questions or concerns.    Johnnye Lana, MSW, LCSW, OSW-C Clinical Social Worker [facility] [phone]

## 2016-11-04 ENCOUNTER — Encounter: Payer: Self-pay | Admitting: *Deleted

## 2016-11-04 NOTE — Progress Notes (Signed)
[facility] Clinical Social Work  Clinical Social Work met with husband of pt for supportive counseling due to caregiving challenges, processing diagnosis and emotions related to diagnosis. Coping techniques and validation of emotions were discussed. CSW team to continue to follow and assist pt and family as appropriate.   Clinical Social Work interventions: Supportive counseling  Grier Janziel Hockett, LCSW, OSW-C Clinical Social Worker [facility]  [facility] Phone: [phone] Fax: [phone]    

## 2016-11-08 ENCOUNTER — Telehealth: Payer: Self-pay | Admitting: Hematology

## 2016-11-08 NOTE — Telephone Encounter (Signed)
Patient had called in earlier to confirm schedule and see if appt could be rescheduled.Left message informing patient that appts for treatment could not be rescheduled to patients preferred date.

## 2016-11-09 ENCOUNTER — Ambulatory Visit (HOSPITAL_BASED_OUTPATIENT_CLINIC_OR_DEPARTMENT_OTHER): Payer: Federal, State, Local not specified - PPO | Admitting: Hematology

## 2016-11-09 ENCOUNTER — Telehealth: Payer: Self-pay | Admitting: Hematology

## 2016-11-09 ENCOUNTER — Ambulatory Visit: Payer: Federal, State, Local not specified - PPO

## 2016-11-09 ENCOUNTER — Encounter: Payer: Self-pay | Admitting: Hematology

## 2016-11-09 ENCOUNTER — Ambulatory Visit (HOSPITAL_BASED_OUTPATIENT_CLINIC_OR_DEPARTMENT_OTHER): Payer: Federal, State, Local not specified - PPO

## 2016-11-09 ENCOUNTER — Other Ambulatory Visit (HOSPITAL_BASED_OUTPATIENT_CLINIC_OR_DEPARTMENT_OTHER): Payer: Federal, State, Local not specified - PPO

## 2016-11-09 VITALS — BP 119/76 | HR 76 | Temp 98.7°F | Resp 18 | Ht 66.0 in | Wt 134.5 lb

## 2016-11-09 DIAGNOSIS — D701 Agranulocytosis secondary to cancer chemotherapy: Secondary | ICD-10-CM

## 2016-11-09 DIAGNOSIS — C221 Intrahepatic bile duct carcinoma: Secondary | ICD-10-CM

## 2016-11-09 DIAGNOSIS — R109 Unspecified abdominal pain: Secondary | ICD-10-CM | POA: Diagnosis not present

## 2016-11-09 DIAGNOSIS — R110 Nausea: Secondary | ICD-10-CM | POA: Diagnosis not present

## 2016-11-09 DIAGNOSIS — R630 Anorexia: Secondary | ICD-10-CM | POA: Diagnosis not present

## 2016-11-09 DIAGNOSIS — C7801 Secondary malignant neoplasm of right lung: Principal | ICD-10-CM

## 2016-11-09 DIAGNOSIS — R634 Abnormal weight loss: Secondary | ICD-10-CM

## 2016-11-09 DIAGNOSIS — Z5111 Encounter for antineoplastic chemotherapy: Secondary | ICD-10-CM

## 2016-11-09 DIAGNOSIS — J984 Other disorders of lung: Secondary | ICD-10-CM | POA: Diagnosis not present

## 2016-11-09 LAB — COMPREHENSIVE METABOLIC PANEL
ALT: 13 U/L (ref 0–55)
AST: 15 U/L (ref 5–34)
Albumin: 3.9 g/dL (ref 3.5–5.0)
Alkaline Phosphatase: 52 U/L (ref 40–150)
Anion Gap: 8 mEq/L (ref 3–11)
BUN: 7.8 mg/dL (ref 7.0–26.0)
CO2: 26 mEq/L (ref 22–29)
Calcium: 8.9 mg/dL (ref 8.4–10.4)
Chloride: 110 mEq/L — ABNORMAL HIGH (ref 98–109)
Creatinine: 0.7 mg/dL (ref 0.6–1.1)
EGFR: >90 mL/min/{1.73_m2} (ref >90)
Glucose: 87 mg/dl (ref 70–140)
Potassium: 4.1 mEq/L (ref 3.5–5.1)
Sodium: 143 mEq/L (ref 136–145)
Total Bilirubin: <0.22 mg/dL (ref 0.20–1.20)
Total Protein: 6.4 g/dL (ref 6.4–8.3)

## 2016-11-09 LAB — CBC WITH DIFFERENTIAL/PLATELET
BASO%: 0.4 % (ref 0.0–2.0)
Basophils Absolute: 0 10*3/uL (ref 0.0–0.1)
EOS%: 1.5 % (ref 0.0–7.0)
Eosinophils Absolute: 0.1 10*3/uL (ref 0.0–0.5)
HCT: 30.2 % — ABNORMAL LOW (ref 34.8–46.6)
HGB: 10.1 g/dL — ABNORMAL LOW (ref 11.6–15.9)
LYMPH%: 29.2 % (ref 14.0–49.7)
MCH: 29.9 pg (ref 25.1–34.0)
MCHC: 33.4 g/dL (ref 31.5–36.0)
MCV: 89.3 fL (ref 79.5–101.0)
MONO#: 0.6 10*3/uL (ref 0.1–0.9)
MONO%: 8.7 % (ref 0.0–14.0)
NEUT#: 4.1 10*3/uL (ref 1.5–6.5)
NEUT%: 60.2 % (ref 38.4–76.8)
Platelets: 258 10*3/uL (ref 145–400)
RBC: 3.38 10*6/uL — ABNORMAL LOW (ref 3.70–5.45)
RDW: 17.6 % — ABNORMAL HIGH (ref 11.2–14.5)
WBC: 6.8 10*3/uL (ref 3.9–10.3)
lymph#: 2 10*3/uL (ref 0.9–3.3)
nRBC: 0 % (ref 0–0)

## 2016-11-09 MED ORDER — POTASSIUM CHLORIDE 2 MEQ/ML IV SOLN
Freq: Once | INTRAVENOUS | Status: AC
  Administered 2016-11-09: 10:00:00 via INTRAVENOUS
  Filled 2016-11-09: qty 10

## 2016-11-09 MED ORDER — PALONOSETRON HCL INJECTION 0.25 MG/5ML
INTRAVENOUS | Status: AC
  Filled 2016-11-09: qty 5

## 2016-11-09 MED ORDER — HEPARIN SOD (PORK) LOCK FLUSH 100 UNIT/ML IV SOLN
500.0000 [IU] | Freq: Once | INTRAVENOUS | Status: AC | PRN
  Administered 2016-11-09: 500 [IU]
  Filled 2016-11-09: qty 5

## 2016-11-09 MED ORDER — CISPLATIN CHEMO INJECTION 100MG/100ML
25.0000 mg/m2 | Freq: Once | INTRAVENOUS | Status: AC
  Administered 2016-11-09: 43 mg via INTRAVENOUS
  Filled 2016-11-09: qty 43

## 2016-11-09 MED ORDER — SODIUM CHLORIDE 0.9 % IV SOLN
Freq: Once | INTRAVENOUS | Status: AC
  Administered 2016-11-09: 12:00:00 via INTRAVENOUS
  Filled 2016-11-09: qty 5

## 2016-11-09 MED ORDER — SODIUM CHLORIDE 0.9% FLUSH
10.0000 mL | INTRAVENOUS | Status: DC | PRN
  Administered 2016-11-09: 10 mL
  Filled 2016-11-09: qty 10

## 2016-11-09 MED ORDER — SODIUM CHLORIDE 0.9 % IV SOLN
Freq: Once | INTRAVENOUS | Status: AC
  Administered 2016-11-09: 10:00:00 via INTRAVENOUS

## 2016-11-09 MED ORDER — PALONOSETRON HCL INJECTION 0.25 MG/5ML
0.2500 mg | Freq: Once | INTRAVENOUS | Status: AC
  Administered 2016-11-09: 0.25 mg via INTRAVENOUS

## 2016-11-09 MED ORDER — SODIUM CHLORIDE 0.9 % IV SOLN
800.0000 mg/m2 | Freq: Once | INTRAVENOUS | Status: AC
  Administered 2016-11-09: 1368 mg via INTRAVENOUS
  Filled 2016-11-09: qty 35.98

## 2016-11-09 NOTE — Telephone Encounter (Signed)
Next treatment date scheduled for Mondays, per patient request and apporoved per Dr Burr Medico. Lab, flush, follow up with Erasmo Downer (MD on PAL, OK per MD), chemo and injection apointments was scheduled for 11/29/16 and patient kept 11/16/16 appointments as is, per 11/09/16 los. Patient was given a copy of the AVS report and appointment schedule per 11/09/16 los.

## 2016-11-09 NOTE — Progress Notes (Signed)
Pt stated in the am when she bends over she feels weak and needs to sit down, she has also noticed a change with the color of her left eye. Notified Audiological scientist. No new orders.  Per Thu RN ok to treat with bilirubin <0.22.

## 2016-11-09 NOTE — Patient Instructions (Signed)

## 2016-11-09 NOTE — Patient Instructions (Signed)
[place] Discharge Instructions for Patients Receiving Chemotherapy  Today you received the following chemotherapy agents:  Gemzar, Cisplatin  To help prevent nausea and vomiting after your treatment, we encourage you to take your nausea medication as prescribed.   If you develop nausea and vomiting that is not controlled by your nausea medication, call the clinic.   BELOW ARE SYMPTOMS THAT SHOULD BE REPORTED IMMEDIATELY:  *FEVER GREATER THAN 100.5 F  *CHILLS WITH OR WITHOUT FEVER  NAUSEA AND VOMITING THAT IS NOT CONTROLLED WITH YOUR NAUSEA MEDICATION  *UNUSUAL SHORTNESS OF BREATH  *UNUSUAL BRUISING OR BLEEDING  TENDERNESS IN MOUTH AND THROAT WITH OR WITHOUT PRESENCE OF ULCERS  *URINARY PROBLEMS  *BOWEL PROBLEMS  UNUSUAL RASH Items with * indicate a potential emergency and should be followed up as soon as possible.  Feel free to call the clinic you have any questions or concerns. The clinic phone number is [phone].  Please show the [place] at check-in to the Emergency Department and triage nurse.

## 2016-11-10 LAB — CANCER ANTIGEN 19-9: CA 19-9: 44 U/mL — ABNORMAL HIGH (ref 0–35)

## 2016-11-16 ENCOUNTER — Ambulatory Visit (HOSPITAL_BASED_OUTPATIENT_CLINIC_OR_DEPARTMENT_OTHER): Payer: Federal, State, Local not specified - PPO

## 2016-11-16 ENCOUNTER — Ambulatory Visit: Payer: Federal, State, Local not specified - PPO

## 2016-11-16 ENCOUNTER — Other Ambulatory Visit (HOSPITAL_BASED_OUTPATIENT_CLINIC_OR_DEPARTMENT_OTHER): Payer: Federal, State, Local not specified - PPO

## 2016-11-16 VITALS — BP 101/68 | HR 81 | Temp 98.5°F | Resp 18

## 2016-11-16 DIAGNOSIS — C221 Intrahepatic bile duct carcinoma: Secondary | ICD-10-CM

## 2016-11-16 DIAGNOSIS — Z5111 Encounter for antineoplastic chemotherapy: Secondary | ICD-10-CM | POA: Diagnosis not present

## 2016-11-16 DIAGNOSIS — C7801 Secondary malignant neoplasm of right lung: Principal | ICD-10-CM

## 2016-11-16 LAB — CBC WITH DIFFERENTIAL/PLATELET
BASO%: 0.2 % (ref 0.0–2.0)
Basophils Absolute: 0 10*3/uL (ref 0.0–0.1)
EOS%: 0.7 % (ref 0.0–7.0)
Eosinophils Absolute: 0 10*3/uL (ref 0.0–0.5)
HCT: 29.3 % — ABNORMAL LOW (ref 34.8–46.6)
HGB: 9.7 g/dL — ABNORMAL LOW (ref 11.6–15.9)
LYMPH%: 34.7 % (ref 14.0–49.7)
MCH: 29.8 pg (ref 25.1–34.0)
MCHC: 33.1 g/dL (ref 31.5–36.0)
MCV: 89.9 fL (ref 79.5–101.0)
MONO#: 0.2 10*3/uL (ref 0.1–0.9)
MONO%: 3.7 % (ref 0.0–14.0)
NEUT#: 2.6 10*3/uL (ref 1.5–6.5)
NEUT%: 60.7 % (ref 38.4–76.8)
Platelets: 255 10*3/uL (ref 145–400)
RBC: 3.26 10*6/uL — ABNORMAL LOW (ref 3.70–5.45)
RDW: 16.2 % — ABNORMAL HIGH (ref 11.2–14.5)
WBC: 4.4 10*3/uL (ref 3.9–10.3)
lymph#: 1.5 10*3/uL (ref 0.9–3.3)

## 2016-11-16 LAB — COMPREHENSIVE METABOLIC PANEL
ALT: 16 U/L (ref 0–55)
AST: 15 U/L (ref 5–34)
Albumin: 4 g/dL (ref 3.5–5.0)
Alkaline Phosphatase: 43 U/L (ref 40–150)
Anion Gap: 10 mEq/L (ref 3–11)
BUN: 10.7 mg/dL (ref 7.0–26.0)
CO2: 25 mEq/L (ref 22–29)
Calcium: 9.3 mg/dL (ref 8.4–10.4)
Chloride: 107 mEq/L (ref 98–109)
Creatinine: 0.7 mg/dL (ref 0.6–1.1)
EGFR: >90 mL/min/{1.73_m2} (ref >90)
Glucose: 97 mg/dl (ref 70–140)
Potassium: 3.9 mEq/L (ref 3.5–5.1)
Sodium: 143 mEq/L (ref 136–145)
Total Bilirubin: 0.24 mg/dL (ref 0.20–1.20)
Total Protein: 6.5 g/dL (ref 6.4–8.3)

## 2016-11-16 MED ORDER — POTASSIUM CHLORIDE 2 MEQ/ML IV SOLN
Freq: Once | INTRAVENOUS | Status: AC
  Administered 2016-11-16: 10:00:00 via INTRAVENOUS
  Filled 2016-11-16: qty 10

## 2016-11-16 MED ORDER — SODIUM CHLORIDE 0.9% FLUSH
10.0000 mL | INTRAVENOUS | Status: DC | PRN
  Administered 2016-11-16: 10 mL
  Filled 2016-11-16: qty 10

## 2016-11-16 MED ORDER — HEPARIN SOD (PORK) LOCK FLUSH 100 UNIT/ML IV SOLN
500.0000 [IU] | Freq: Once | INTRAVENOUS | Status: AC | PRN
  Administered 2016-11-16: 500 [IU]
  Filled 2016-11-16: qty 5

## 2016-11-16 MED ORDER — SODIUM CHLORIDE 0.9 % IV SOLN
Freq: Once | INTRAVENOUS | Status: AC
  Administered 2016-11-16: 12:00:00 via INTRAVENOUS
  Filled 2016-11-16: qty 5

## 2016-11-16 MED ORDER — PALONOSETRON HCL INJECTION 0.25 MG/5ML
INTRAVENOUS | Status: AC
  Filled 2016-11-16: qty 5

## 2016-11-16 MED ORDER — SODIUM CHLORIDE 0.9 % IV SOLN
Freq: Once | INTRAVENOUS | Status: AC
  Administered 2016-11-16: 09:00:00 via INTRAVENOUS

## 2016-11-16 MED ORDER — SODIUM CHLORIDE 0.9 % IV SOLN
800.0000 mg/m2 | Freq: Once | INTRAVENOUS | Status: AC
  Administered 2016-11-16: 1368 mg via INTRAVENOUS
  Filled 2016-11-16: qty 35.98

## 2016-11-16 MED ORDER — PALONOSETRON HCL INJECTION 0.25 MG/5ML
0.2500 mg | Freq: Once | INTRAVENOUS | Status: AC
  Administered 2016-11-16: 0.25 mg via INTRAVENOUS

## 2016-11-16 MED ORDER — CISPLATIN CHEMO INJECTION 100MG/100ML
25.0000 mg/m2 | Freq: Once | INTRAVENOUS | Status: AC
  Administered 2016-11-16: 43 mg via INTRAVENOUS
  Filled 2016-11-16: qty 43

## 2016-11-16 NOTE — Patient Instructions (Signed)
[place] Discharge Instructions for Patients Receiving Chemotherapy  Today you received the following chemotherapy agents:  Gemzar, Cisplatin  To help prevent nausea and vomiting after your treatment, we encourage you to take your nausea medication as prescribed.   If you develop nausea and vomiting that is not controlled by your nausea medication, call the clinic.   BELOW ARE SYMPTOMS THAT SHOULD BE REPORTED IMMEDIATELY:  *FEVER GREATER THAN 100.5 F  *CHILLS WITH OR WITHOUT FEVER  NAUSEA AND VOMITING THAT IS NOT CONTROLLED WITH YOUR NAUSEA MEDICATION  *UNUSUAL SHORTNESS OF BREATH  *UNUSUAL BRUISING OR BLEEDING  TENDERNESS IN MOUTH AND THROAT WITH OR WITHOUT PRESENCE OF ULCERS  *URINARY PROBLEMS  *BOWEL PROBLEMS  UNUSUAL RASH Items with * indicate a potential emergency and should be followed up as soon as possible.  Feel free to call the clinic you have any questions or concerns. The clinic phone number is [phone].  Please show the [place] at check-in to the Emergency Department and triage nurse.

## 2016-11-17 ENCOUNTER — Ambulatory Visit (HOSPITAL_BASED_OUTPATIENT_CLINIC_OR_DEPARTMENT_OTHER): Payer: Federal, State, Local not specified - PPO

## 2016-11-17 VITALS — BP 124/56 | HR 94 | Temp 98.2°F | Resp 18

## 2016-11-17 DIAGNOSIS — Z5189 Encounter for other specified aftercare: Secondary | ICD-10-CM | POA: Diagnosis not present

## 2016-11-17 DIAGNOSIS — C221 Intrahepatic bile duct carcinoma: Secondary | ICD-10-CM | POA: Diagnosis not present

## 2016-11-17 DIAGNOSIS — C7801 Secondary malignant neoplasm of right lung: Principal | ICD-10-CM

## 2016-11-17 MED ORDER — PEGFILGRASTIM INJECTION 6 MG/0.6ML ~~LOC~~
6.0000 mg | PREFILLED_SYRINGE | Freq: Once | SUBCUTANEOUS | Status: AC
  Administered 2016-11-17: 6 mg via SUBCUTANEOUS
  Filled 2016-11-17: qty 0.6

## 2016-11-23 ENCOUNTER — Telehealth: Payer: Self-pay | Admitting: *Deleted

## 2016-11-23 NOTE — Telephone Encounter (Signed)
Pt called yesterday requesting a call back from nurse.  Called pt back without answer at phone number provided.  Left message on voice mail asking pt to call nurse back.  No call from pt at end of day. Today, received message that pt called wanting to talk to nurse.  Called pt back without answer.  Left message on voice mail requesting a call back to collaborative nurse. Pt's   Phone     [phone].

## 2016-11-23 NOTE — Telephone Encounter (Signed)
Spoke with pt and was informed re:  Pt had chemo last Tuesday , and Neulasta on Wed.   Starting Thursday, pt experienced severe back pain, bone pain,  and decreased appetite.  Stated feeling nauseated; took Zofran through rest of weekend with some relief.  Stated no problem with chewing  and swallowing, just NO Appetite , and no taste feeling. Pt is still taking Claritin daily to help with bone pain from Neulasta. Stated she has been able to eat ok since Monday; encouraged pt to increase po fluids as tolerated. Pt would like to ask Dr. Burr Medico for a break from chemo this coming week of 11/29/16.   Stated she would like to be able to eat well to gain some weight back before resuming chemo again.  Dr. Burr Medico notified. Instructed pt to keep appt with NP on 11/29/16 and for possible IVF if needed.   Pt will be scheduled to see Dr. Burr Medico on 12/06/16 and for possible chemo if pt agrees.  Pt voiced understanding. Pt's    Phone    [phone].

## 2016-11-25 ENCOUNTER — Other Ambulatory Visit: Payer: Self-pay | Admitting: Hematology

## 2016-11-29 ENCOUNTER — Ambulatory Visit: Payer: Federal, State, Local not specified - PPO

## 2016-11-29 ENCOUNTER — Telehealth: Payer: Self-pay | Admitting: Hematology

## 2016-11-29 ENCOUNTER — Encounter: Payer: Self-pay | Admitting: Oncology

## 2016-11-29 ENCOUNTER — Ambulatory Visit (HOSPITAL_BASED_OUTPATIENT_CLINIC_OR_DEPARTMENT_OTHER): Payer: Federal, State, Local not specified - PPO | Admitting: Oncology

## 2016-11-29 ENCOUNTER — Other Ambulatory Visit (HOSPITAL_BASED_OUTPATIENT_CLINIC_OR_DEPARTMENT_OTHER): Payer: Federal, State, Local not specified - PPO

## 2016-11-29 ENCOUNTER — Telehealth: Payer: Self-pay | Admitting: Oncology

## 2016-11-29 VITALS — BP 117/75 | HR 70 | Temp 98.4°F | Resp 16 | Wt 132.6 lb

## 2016-11-29 DIAGNOSIS — C221 Intrahepatic bile duct carcinoma: Secondary | ICD-10-CM

## 2016-11-29 DIAGNOSIS — R634 Abnormal weight loss: Secondary | ICD-10-CM

## 2016-11-29 DIAGNOSIS — R110 Nausea: Secondary | ICD-10-CM

## 2016-11-29 DIAGNOSIS — Z7189 Other specified counseling: Secondary | ICD-10-CM

## 2016-11-29 DIAGNOSIS — C7801 Secondary malignant neoplasm of right lung: Principal | ICD-10-CM

## 2016-11-29 DIAGNOSIS — R630 Anorexia: Secondary | ICD-10-CM

## 2016-11-29 DIAGNOSIS — J984 Other disorders of lung: Secondary | ICD-10-CM

## 2016-11-29 DIAGNOSIS — R109 Unspecified abdominal pain: Secondary | ICD-10-CM | POA: Diagnosis not present

## 2016-11-29 DIAGNOSIS — D701 Agranulocytosis secondary to cancer chemotherapy: Secondary | ICD-10-CM | POA: Diagnosis not present

## 2016-11-29 LAB — COMPREHENSIVE METABOLIC PANEL
ALT: 14 U/L (ref 0–55)
AST: 14 U/L (ref 5–34)
Albumin: 4 g/dL (ref 3.5–5.0)
Alkaline Phosphatase: 51 U/L (ref 40–150)
Anion Gap: 8 mEq/L (ref 3–11)
BUN: 10.8 mg/dL (ref 7.0–26.0)
CO2: 25 mEq/L (ref 22–29)
Calcium: 9.2 mg/dL (ref 8.4–10.4)
Chloride: 108 mEq/L (ref 98–109)
Creatinine: 0.7 mg/dL (ref 0.6–1.1)
EGFR: >90 mL/min/{1.73_m2} (ref >90)
Glucose: 83 mg/dl (ref 70–140)
Potassium: 3.8 mEq/L (ref 3.5–5.1)
Sodium: 141 mEq/L (ref 136–145)
Total Bilirubin: <0.22 mg/dL (ref 0.20–1.20)
Total Protein: 6.2 g/dL — ABNORMAL LOW (ref 6.4–8.3)

## 2016-11-29 LAB — CBC WITH DIFFERENTIAL/PLATELET
BASO%: 0.7 % (ref 0.0–2.0)
Basophils Absolute: 0 10*3/uL (ref 0.0–0.1)
EOS%: 1.3 % (ref 0.0–7.0)
Eosinophils Absolute: 0.1 10*3/uL (ref 0.0–0.5)
HCT: 30.5 % — ABNORMAL LOW (ref 34.8–46.6)
HGB: 10.1 g/dL — ABNORMAL LOW (ref 11.6–15.9)
LYMPH%: 27.7 % (ref 14.0–49.7)
MCH: 30.4 pg (ref 25.1–34.0)
MCHC: 33.1 g/dL (ref 31.5–36.0)
MCV: 91.9 fL (ref 79.5–101.0)
MONO#: 0.4 10*3/uL (ref 0.1–0.9)
MONO%: 6.7 % (ref 0.0–14.0)
NEUT#: 3.9 10*3/uL (ref 1.5–6.5)
NEUT%: 63.6 % (ref 38.4–76.8)
Platelets: 201 10*3/uL (ref 145–400)
RBC: 3.32 10*6/uL — ABNORMAL LOW (ref 3.70–5.45)
RDW: 18.5 % — ABNORMAL HIGH (ref 11.2–14.5)
WBC: 6.1 10*3/uL (ref 3.9–10.3)
lymph#: 1.7 10*3/uL (ref 0.9–3.3)

## 2016-11-29 LAB — MAGNESIUM: Magnesium: 2 mg/dl (ref 1.5–2.5)

## 2016-11-29 MED ORDER — HEPARIN SOD (PORK) LOCK FLUSH 100 UNIT/ML IV SOLN
500.0000 [IU] | Freq: Once | INTRAVENOUS | Status: AC
  Administered 2016-11-29: 500 [IU] via INTRAVENOUS
  Filled 2016-11-29: qty 5

## 2016-11-29 MED ORDER — SODIUM CHLORIDE 0.9% FLUSH
10.0000 mL | INTRAVENOUS | Status: DC | PRN
  Administered 2016-11-29: 10 mL via INTRAVENOUS
  Filled 2016-11-29: qty 10

## 2016-11-29 NOTE — Telephone Encounter (Signed)
Left patient message with added appt date and time per 11/29/2016 los.

## 2016-11-29 NOTE — Telephone Encounter (Signed)
Gave patient AVS and canceled/ schedueld appts per 11/29/2016 los. Unable to schedule 4/16 treatment. Will call patient when scheduled.

## 2016-11-29 NOTE — Progress Notes (Signed)
[name]  Telephone:([phone]) [phone] Fax:([phone]) [phone]  Clinic Follow Up Note   Patient Care Team: [name], DO as PCP - General Jerene Bears, MD as Consulting Physician (Gastroenterology) 11/29/2016  CHIEF COMPLAINTS:  Follow up metastatic intrahepatic cholangiocarcinoma  Oncology History   Metastatic cholangiocarcinoma to lung [facility])   Staging form: Perihilar Bile Ducts, AJCC 7th Edition   - Clinical stage from 07/07/2016: Stage IVB (T2b, N1, M1) - Signed by Truitt Merle, MD on 07/28/2016      Metastatic cholangiocarcinoma to lung ([place])   05/22/2014 Procedure    Routine screening colonoscopy showed a sessile polyp measuring 4 mm in the sigmoid colon, removed, otherwise negative.      06/29/2016 Imaging    CT chest, abdomen and pelvis showed 2 indeterminant nodule in left and the right lung, 4-85m, indeterminate a hypovascular liver lesions largest in the caudate lobe measuring 2.6 cm, mild abdominal lymphadenopathy in the gastrohepatic ligament and portocaval space.      07/02/2016 Imaging    Abdominal MRI with and without contrast showed 2 lobular lesions in the caudate lobe, and is regular lymph nodes in the gastrohepatic ligament which is adjacent to the liver lesion, suspicious for malignancy. 2 small lesions in the right hepatic lobe are indeterminate, but concerning for metastasis.      07/07/2016 Procedure    EGD was negative, EUS biopsy of the liver and adjacent lymph node      07/07/2016 Initial Diagnosis    Metastatic cholangiocarcinoma to lung ([place]      07/07/2016 Initial Biopsy    Fine-needle aspiration of the liver lesion in caudate lobe and adjacent lymph nodes both showed metastatic adenocarcinoma.      08/11/2016 -  Chemotherapy    Cisplatin 25 mg/m, gemcitabine 1000 mg/m, on day one and 8, every 21 days        10/18/2016 Imaging    CT CAP  IMPRESSION: 1. Again noted are multifocal liver metastasis. Overall there has been no  significant interval change in overall volume of liver metastases. 2. Similar appearance of upper abdominal adenopathy 3. Slight decrease in size of small pulmonary metastases.       HISTORY OF PRESENTING ILLNESS:  Nancy Gallery89y.o. female is here because of Her recently diagnosed metastatic glandular carcinoma. She is accompanied by her husband and mother to my clinic today.  She has been having RUQ abdominal pain since mid 04/2016, she was seen by PCP and was treated for gas with GI cocktail and pepcide, which she did not help much. Her pain got worse, and radiates to right shoulder, she denies significant nausea, or bloating, BM normal, no fever, cough or dyspnea. She recently noticed mild chest discomfort in the low sternum area. She initially had the lab, ultrasound done by her primary care physician, which was unrevealing. She was referred to GI Dr. PHilarie Fredricksonand underwent EGD and CT scan, which showed a lobulated mass in the caudate lobe of liver, with at adjacent adenopathy. He underwent EUS and fine-needle biopsy of the liver mass and lymph nodes, all reviewed adenocarcinoma.  She has lost about 7lbs in the past 3 months. She has mild fatigue, but able to function at home. She has stopped working due to her abdominal pain and fatigue. She takes Norco every 1-3 times a day, and her pain seems not well controlled.  CURRENT THERAPY: Cisplatin and gemcitabine on Day 1, 8 every 21 days, with neulasta on day 9, Gemcitabine  dosed reduced to 800 mg/m due to cytopenias.  INTERIM HISTORY: Nancy [name] returns for follow up with a family member. The patient states that she thinks she overdid it following last cycle of chemotherapy. The day after her chemotherapy she felt very well and went to a tai chi class. She returned for her Neulasta injection and felt very bad afterwards. Her appetite was decreased and she had some nausea. Zofran with some improvement. Denied any vomiting. She also took her  Nancy [name] which did not help her appetite either. Stated that she felt as though she had a film over her mouth think tasted good. She was able to start eating about a week after chemotherapy. She notes that her weight is down by about 2 pounds since her last visit here. Reports normal bowel movements. She was able to go to church yesterday for Nancy [name] reports that seeing friends and family took a lot out of her. She is requesting a delay of her chemotherapy by 1 week that she can continue to try to feel better before giving additional chemotherapy.   MEDICAL HISTORY:  Past Medical History:  Diagnosis Date  . Allergy    SEASONAL  . Anemia    HIGH SCHOOL  . Atopic eczema   . Fibrocystic breast disease   . GERD (gastroesophageal reflux disease)   . Hx of migraines    seasonal   . Pneumonia    6 years ago   . TIA (transient ischemic attack)     SURGICAL HISTORY: Past Surgical History:  Procedure Laterality Date  . EUS N/A 07/07/2016   Procedure: UPPER ENDOSCOPIC ULTRASOUND (EUS) RADIAL;  Surgeon: [name], MD;  Location: [facility] ENDOSCOPY;  Service: Endoscopy;  Laterality: N/A;  . IR GENERIC HISTORICAL  08/03/2016   IR US GUIDE VASC ACCESS RIGHT 08/03/2016 Corrie Mckusick, DO [facility]-INTERV RAD  . IR GENERIC HISTORICAL  08/03/2016   IR FLUORO GUIDE PORT INSERTION RIGHT 08/03/2016 Corrie Mckusick, DO [facility]  . MYOMECTOMY  2002    SOCIAL HISTORY: Social History   Social History  . Marital status: Married    Spouse name: N/A  . Number of children: 0  . Years of education: 4   Occupational History  . [employer]  .  [employer]   Social History Main Topics  . Smoking status: Never Smoker  . Smokeless tobacco: Never Used  . Alcohol use Yes     Comment: maybe 3 times a year  . Drug use: No  . Sexual activity: Yes    Partners: Male   Other Topics Concern  . Not on file   Social History Narrative  . No narrative on file    FAMILY HISTORY: Family History  Problem Relation  Age of Onset  . Gout Mother   . Diabetes Mother   . Hypertension Father   . Diabetes Father   . Breast cancer Paternal Aunt   . Cancer Maternal Grandfather     throat cancer   . Cancer Cousin 34    GI cancer   . Thyroid disease Neg Hx     ALLERGIES:  is allergic to penicillins and lipitor [atorvastatin].  MEDICATIONS:  Current Outpatient Prescriptions  Medication Sig Dispense Refill  . ALPRAZolam (XANAX) 0.5 MG tablet Take 1 tablet (0.5 mg total) by mouth as needed for anxiety. 10 tablet 0  . aspirin 81 MG tablet Take 81 mg by mouth daily.    Marland Kitchen CARAFATE 1 GM/10ML suspension TAKE 10 MLS (1  G TOTAL) BY MOUTH 4 (FOUR) TIMES DAILY - WITH MEALS AND AT BEDTIME. 420 mL 1  . cetirizine (ZYRTEC) 10 MG tablet Take 10 mg by mouth daily as needed. For allergies     . Cholecalciferol (VITAMIN D3) 5000 UNITS CAPS Take 1 capsule by mouth daily.    . Ciclopirox 1 % shampoo Use as directed 2x a week for up to 4 weeks 120 mL 1  . COLOSTRUM PO Take 1.25 g by mouth 2 (two) times daily. 1.25 grams twice daily    . dicyclomine (BENTYL) 20 MG tablet 3 (three) times daily as needed.     . dronabinol (Nancy [name]) 5 MG capsule Take 1 capsule (5 mg total) by mouth 2 (two) times daily before a meal. 60 capsule 1  . HYDROcodone-acetaminophen (NORCO) 5-325 MG tablet Take 1-2 tablets by mouth every 6 (six) hours as needed for severe pain. (Patient not taking: Reported on 11/09/2016) 60 tablet 0  . lidocaine-prilocaine (EMLA) cream Apply 1 application topically as needed. Apply to portacath 1 1/2 hours - 2 hours prior to procedures as needed. 30 g 1  . meclizine (ANTIVERT) 50 MG tablet Take 1 tablet (50 mg total) by mouth 3 (three) times daily as needed. 30 tablet 0  . Menaquinone-7 (VITAMIN K2) 40 MCG TABS Take by mouth.    . mometasone (ELOCON) 0.1 % lotion Apply topically daily. 60 mL 0  . NON FORMULARY 1 capsule 1 day or 1 dose. Pregnelone, pt reports she takes daily.    . Nutritional Supplements (DHEA) 15-50 MG  CAPS Take 1 capsule by mouth 3 (three) times a week.    . nystatin (MYCOSTATIN) 100000 UNIT/ML suspension Take 5 mLs (500,000 Units total) by mouth 4 (four) times daily. 60 mL 0  . omeprazole (PRILOSEC) 40 MG capsule Take 1 capsule (40 mg total) by mouth 2 (two) times daily before a meal. 60 capsule 11  . ondansetron (ZOFRAN) 8 MG tablet Take 1 tablet (8 mg total) by mouth every 8 (eight) hours as needed for nausea or vomiting. 20 tablet 1  . Prenatal Vit-Fe Fumarate-FA (PRENATAL MULTIVITAMIN) TABS tablet Take 2 tablets by mouth daily at 12 noon.    . triamcinolone cream (KENALOG) 0.1 % Apply topically.    Marland Kitchen VITAMIN K PO Take 1 capsule by mouth daily.     No current facility-administered medications for this visit.    Facility-Administered Medications Ordered in Other Visits  Medication Dose Route Frequency Provider Last Rate Last Dose  . alteplase (CATHFLO ACTIVASE) injection 2 mg  2 mg Intracatheter Once PRN Truitt Merle, MD      . heparin lock flush 100 unit/mL  250 Units Intracatheter Once PRN Truitt Merle, MD      . sodium chloride flush (NS) 0.9 % injection 10 mL  10 mL Intracatheter PRN Truitt Merle, MD   10 mL at 09/09/16 1645    REVIEW OF SYSTEMS:   Constitutional: Denies fevers, chills or abnormal night sweats (+) fatigue Eyes: Denies blurriness of vision, double vision or watery eyes Ears, nose, mouth, throat, and face: Denies mucositis or sore throat Respiratory: Denies cough, dyspnea or wheezes Cardiovascular: Denies palpitation, chest discomfort or lower extremity swelling Gastrointestinal:  Denies heartburn or change in bowel habits (+) nausea, abdominal pain Skin: Denies abnormal skin rashes (+) cold sore Lymphatics: Denies new lymphadenopathy or easy bruising Neurological:Denies numbness, tingling or new weaknesses, (+) dizziness, tingling R side of head Behavioral/Psych: Mood is stable, no new changes  All other  systems were reviewed with the patient and are negative.  PHYSICAL  EXAMINATION: ECOG PERFORMANCE STATUS: 1 - Symptomatic but completely ambulatory   BP 117/75 (BP Location: Right Arm, Patient Position: Sitting)   Pulse 70   Temp 98.4 F (36.9 C) (Oral)   Resp 16   Wt 132 lb 9.6 oz (60.1 kg)   SpO2 100%   BMI 21.40 kg/m    GENERAL:alert, no distress and comfortable SKIN: skin color, texture, turgor are normal, no rashes or significant lesions, several hematomas on extremities EYES: normal, conjunctiva are pink and non-injected, sclera clear OROPHARYNX:no exudate, no erythema and lips, buccal mucosa NECK: supple, thyroid normal size, non-tender, without nodularity LYMPH:  no palpable lymphadenopathy in the cervical, axillary or inguinal LUNGS: clear to auscultation and percussion with normal breathing effort HEART: regular rate & rhythm and no murmurs and no lower extremity edema ABDOMEN:abdomen soft, non-tender and normal bowel sounds Musculoskeletal:no cyanosis of digits and no clubbing  PSYCH: alert & oriented x 3 with fluent speech NEURO: no focal motor/sensory deficits  LABORATORY DATA:  I have reviewed the data as listed CBC Latest Ref Rng & Units 11/29/2016 11/16/2016 11/09/2016  WBC 3.9 - 10.3 10e3/uL 6.1 4.4 6.8  Hemoglobin 11.6 - 15.9 g/dL 10.1(L) 9.7(L) 10.1(L)  Hematocrit 34.8 - 46.6 % 30.5(L) 29.3(L) 30.2(L)  Platelets 145 - 400 10e3/uL 201 255 258   CMP Latest Ref Rng & Units 11/16/2016 11/09/2016 10/25/2016  Glucose 70 - 140 mg/dl 97 87 86  BUN 7.0 - 26.0 mg/dL 10.7 7.8 8.0  Creatinine 0.6 - 1.1 mg/dL 0.7 0.7 0.6  Sodium 136 - 145 mEq/L 143 143 137  Potassium 3.5 - 5.1 mEq/L 3.9 4.1 3.9  Chloride 101 - 111 mmol/L - - -  CO2 22 - 29 mEq/L '25 26 25  '$ Calcium 8.4 - 10.4 mg/dL 9.3 8.9 9.2  Total Protein 6.4 - 8.3 g/dL 6.5 6.4 6.5  Total Bilirubin 0.20 - 1.20 mg/dL 0.24 <0.22 0.49  Alkaline Phos 40 - 150 U/L 43 52 39(L)  AST 5 - 34 U/L '15 15 16  '$ ALT 0 - 55 U/L '16 13 19   '$ ANC 4.1 TODAY  CA19.9 (0-35U/ml) 07/28/2016: 36 08/18/2016:  51 09/13/2016: 56 10/19/2016: 43 11/09/2016: PENDING  PATHOLOGY REPORT  Diagnosis 07/07/2016 FINE NEEDLE ASPIRATION, ENDOSCOPIC, LIVER (SPECIMEN 2 OF 2 COLLECTED 07/07/16): MALIGNANT CELLS CONSISTENT WITH ADENOCARCINOMA.  ADDITIONAL INFORMATION: Per request, immunohistochemistry was performed. The cells are positive for cytokeratin 7 and negative for cytokeratin 20 and CDX-2. This excludes a colorectal primary. Pancreaticobiliary and upper GI sources remain in the differential. Called to Dr. Burr Medico on 07/28/2016.  Diagnosis 07/07/2016 FINE NEEDLE ASPIRATION, ENDOSCOPIC, GASTRO-HEPATIC LYMPH NODE (SPECIMEN 1 OF 2 COLLECTED 07/07/16): MALIGNANT CELLS CONSISTENT WITH ADENOCARCINOMA, SEE COMMENT. Preliminary Diagnosis Intraoperative Diagnosis: Few atypical glandular clusters - recommend additional material. 4) Adequate ([facility])  RADIOGRAPHIC STUDIES: I have personally reviewed the radiological images as listed and agreed with the findings in the report. No results found. CT CAP W CONTRAST 10/18/16 IMPRESSION: 1. Again noted are multifocal liver metastasis. Overall there has been no significant interval change in overall volume of liver metastases. 2. Similar appearance of upper abdominal adenopathy 3. Slight decrease in size of small pulmonary metastases.  Upper EUS 07/07/2016 Dr. Ardis Hughs  - Endoscopically normal UGI tract (good views with standard gastroscope) - Vaguely bordered 4cm mass in the caudate lobe of liver that is confluent with what appear to be enlarged gastrohepatic lymphnodes (these may represent direct tumor extension however). Preliminary  cytology from the lymphnodes shows malignancy, glandular. Preliminary cytology from the liver mass shows the same. Await final cytology results but this is most suspicious for peripheral intrahepatic cholangiocarcinoma with adjacent malignant adenopathy.  COLONOSCOPY 05/22/2014 ENDOSCOPIC IMPRESSION: 1. Sessile polyp measuring 4 mm in size was  found in the sigmoid colon; polypectomy was performed with a cold snare 2. The colon mucosa was otherwise normal  ASSESSMENT & PLAN:  51 y.o. African-American female, without significant past medical history, presented with abdominal pain  1. Metastatic intrahepatic cholangiocarcinoma, with probable lung metastasis, [id number], stage IV -I previously reviewed her CT, MRI, endoscopy findings and her biopsy results with patient and her family members in details. -We have previously reviewed her case in our GI tumor board. Based on the scans and endoscopy findings, this is most consistent with cholangiocarcinoma. Her liver and inguinal biopsy showed adenocarcinoma, IHC was consistent with pancreatic-biliary primary -She has 2 additional small liver lesions and 2 4-18m lung lesions, which are indeterminate but that is suspicious for metastatic disease. -She was seen by surgeon Dr. BBarry Dieneswho thinks she is a not a candidate for surgical resection due to her metastatic disease. -We previously discussed her cancer is incurable at this stage, the goal of therapy is palliation, to prolong life and improve her quality life -she is on first-line cisplatin and gemcitabine, on day 1, 8 every 21 days.  -She has been tolerating chemotherapy moderately well, but had significant neutropenia and thrombocytopenia, cycle 2 chemotherapy was dose reduced -I previously reviewed her restaging CT scan from 10/19/2016, which showed stable disease overall, no other new lesions. She is clinically doing well, her abdominal pain has much improved. -We'll continue chemotherapy with cisplatin and gemcitabine.  -lab reviewed with her. OK to treat, however, she wishes to delay her chemotherapy by one week. -Chemotherapy canceled today per her request and appointments were adjusted to reflect that she will begin cycle 6 of her chemotherapy next week. -plan to repeat staging CT scan in May   2. Abdominal pain, improving -continue  hydrocodone/acetaminophen every 4-6 hours as needed for pain control  -Her pain has nearly resolved after she started chemotherapy, she noticed mild left-sided pain in the past few days, -Constipation and management reviewed with patient -I have recommended her to try TUMS  3. Nausea and anorexia and weight loss  -We reviewed her nausea management, she will continue Compazine and Zofran -I previously encouraged her to take a nutritional supplement -We previously discussed appetite stimulant, she is on Nancy [name], dose was increased to 5 mg twice daily last week, appetite improved, will continue. -she will f/u with dietician  -Previously encouraged her to have a high calorie, high protein diet.   4. Neutropenia  -Secondary to chemotherapy, she is on Neulasta after day 8 chemotherapy. -Continue monitoring  5. Goal of care discussion  -We previously discussed the incurable nature of her cancer, and the overall poor prognosis, especially if she does not have good response to chemotherapy or progress on chemo -The patient understands the goal of care is palliative. -I have recommended DNR/DNI, she will think about it.  Plan -Lab reviewed. adequate to proceed with chemotherapy, but the patient wants a delayed by one week. -Appointments were adjusted to reflect a one-week delay in treatment per her request.   All questions were answered. The patient knows to call the clinic with any problems, questions or concerns.  I spent 20 minutes counseling the patient face to face. The total time spent in the appointment  was 25 minutes and more than 50% was on counseling.    Mikey Bussing, NP 11/29/2016   This document serves as a record of services personally performed by Truitt Merle, MD. It was created on her behalf by Darcus Austin, a trained medical scribe. The creation of this record is based on the scribe's personal observations and the provider's statements to them. This document has been checked and  approved by the attending provider.

## 2016-11-30 NOTE — Progress Notes (Signed)
[place]  [phone] [phone]  Clinic Follow Up Note   Patient Care Team: [name], DO as PCP - General Jerene Bears, MD as Consulting Physician (Gastroenterology) 12/06/2016  CHIEF COMPLAINTS:  Follow up metastatic intrahepatic cholangiocarcinoma  Oncology History   Metastatic cholangiocarcinoma to lung [facility])   Staging form: Perihilar Bile Ducts, AJCC 7th Edition   - Clinical stage from 07/07/2016: Stage IVB (T2b, N1, M1) - Signed by Truitt Merle, MD on 07/28/2016      Metastatic cholangiocarcinoma to lung ([place])   05/22/2014 Procedure    Routine screening colonoscopy showed a sessile polyp measuring 4 mm in the sigmoid colon, removed, otherwise negative.      06/29/2016 Imaging    CT chest, abdomen and pelvis showed 2 indeterminant nodule in left and the right lung, 4-51m, indeterminate a hypovascular liver lesions largest in the caudate lobe measuring 2.6 cm, mild abdominal lymphadenopathy in the gastrohepatic ligament and portocaval space.      07/02/2016 Imaging    Abdominal MRI with and without contrast showed 2 lobular lesions in the caudate lobe, and is regular lymph nodes in the gastrohepatic ligament which is adjacent to the liver lesion, suspicious for malignancy. 2 small lesions in the right hepatic lobe are indeterminate, but concerning for metastasis.      07/07/2016 Procedure    EGD was negative, EUS biopsy of the liver and adjacent lymph node      07/07/2016 Initial Diagnosis    Metastatic [place]angi[place]      07/07/2016 Initial Biopsy    Fine-needle aspiration of the liver lesion in caudate lobe and adjacent lymph nodes both showed metastatic adenocarcinoma.      08/11/2016 -  Chemotherapy    Cisplatin 25 mg/m, gemcitabine 1000 mg/m, on day one and 8, every 21 days        10/18/2016 Imaging    CT CAP  IMPRESSION: 1. Again noted are multifocal liver metastasis. Overall there has been no  significant interval change in overall volume of liver metastases. 2. Similar appearance of upper abdominal adenopathy 3. Slight decrease in size of small pulmonary metastases.       HISTORY OF PRESENTING ILLNESS:  Nancy Golubski[age]y.o. female is here because of Her recently diagnosed metastatic glandular carcinoma. She is accompanied by her husband and mother to my clinic today.  She has been having RUQ abdominal pain since mid 04/2016, she was seen by PCP and was treated for gas with GI cocktail and pepcide, which she did not help much. Her pain got worse, and radiates to right shoulder, she denies significant nausea, or bloating, BM normal, no fever, cough or dyspnea. She recently noticed mild chest discomfort in the low sternum area. She initially had the lab, ultrasound done by her primary care physician, which was unrevealing. She was referred to GI Dr. PHilarie Fredricksonand underwent EGD and CT scan, which showed a lobulated mass in the caudate lobe of liver, with at adjacent adenopathy. He underwent EUS and fine-needle biopsy of the liver mass and lymph nodes, all reviewed adenocarcinoma.  She has lost about 7lbs in the past 3 months. She has mild fatigue, but able to function at home. She has stopped working due to her abdominal pain and fatigue. She takes Norco every 1-3 times a day, and her pain seems not well controlled.  CURRENT THERAPY: Cisplatin and gemcitabine on Day 1, 8 every 21 days, with neulasta on day 9, Gemcitabine  dosed reduced to 800 mg/m due to cytopenias, increased to '900mg'$ /m2 on 12/06/2016.  INTERIM HISTORY: Nancy [name] returns for follow up and cycle 6 of treatment. She presents in the treatment chair. She is doing well today. She requested a 1 week delay of treatment when she saw my NP last week so that she had more time to recover from the previous treatment. She has gained some weight this past week; her appetite has been much better. Her last treatment was harder, but she believes  that is because she over exerted herself afterwards. Last week she had some upper abdominal pain, but it would come and go and was tolerable. She has some browning of her skin on her hands. This morning she noticed a bump on her groin. It does not hurt, and she has not noticed any other bumps. Her hair has been thinning recently. For the past couple of days, she has had a dry cough. Denies nausea, bowel problems, mouth sores, or any other concerns.   MEDICAL HISTORY:  Past Medical History:  Diagnosis Date  . Allergy    SEASONAL  . Anemia    HIGH SCHOOL  . Atopic eczema   . Fibrocystic breast disease   . GERD (gastroesophageal reflux disease)   . Hx of migraines    seasonal   . Pneumonia    6 years ago   . TIA (transient ischemic attack)     SURGICAL HISTORY: Past Surgical History:  Procedure Laterality Date  . EUS N/A 07/07/2016   Procedure: UPPER ENDOSCOPIC ULTRASOUND (EUS) RADIAL;  Surgeon: [name], MD;  Location: [facility] ENDOSCOPY;  Service: Endoscopy;  Laterality: N/A;  . IR GENERIC HISTORICAL  08/03/2016   IR US GUIDE VASC ACCESS RIGHT 08/03/2016 Corrie Mckusick, DO [facility]-INTER[facility] RAD  . IR GENERIC HISTORICAL  08/03/2016   IR FLUORO GUIDE PORT INSERTION RIGHT 08/03/2016 Corrie Mckusick, DO [facility]  . MYOMECTOMY  2002    SOCIAL HISTORY: Social History   Social History  . Marital status: Married    Spouse name: N/A  . Number of children: 0  . Years of education: 74   Occupational History  . [employer]  .  [employer]   Social History Main Topics  . Smoking status: Never Smoker  . Smokeless tobacco: Never Used  . Alcohol use Yes     Comment: maybe 3 times a year  . Drug use: No  . Sexual activity: Yes    Partners: Male   Other Topics Concern  . Not on file   Social History Narrative  . No narrative on file    FAMILY HISTORY: Family History  Problem Relation Age of Onset  . Gout Mother   . Diabetes Mother   . Hypertension Father   . Diabetes Father     . Breast cancer Paternal Aunt   . Cancer Maternal Grandfather     throat cancer   . Cancer Cousin 39    GI cancer   . Thyroid disease Neg Hx     ALLERGIES:  is allergic to penicillins and lipitor [atorvastatin].  MEDICATIONS:  Current Outpatient Prescriptions  Medication Sig Dispense Refill  . ALPRAZolam (XANAX) 0.5 MG tablet Take 1 tablet (0.5 mg total) by mouth as needed for anxiety. 10 tablet 0  . aspirin 81 MG tablet Take 81 mg by mouth daily.    Marland Kitchen CARAFATE 1 GM/10ML suspension TAKE 10 MLS (1 G TOTAL) BY MOUTH 4 (FOUR) TIMES DAILY - WITH MEALS  AND AT BEDTIME. 420 mL 1  . cetirizine (ZYRTEC) 10 MG tablet Take 10 mg by mouth daily as needed. For allergies     . Cholecalciferol (VITAMIN D3) 5000 UNITS CAPS Take 1 capsule by mouth daily.    . Ciclopirox 1 % shampoo Use as directed 2x a week for up to 4 weeks 120 mL 1  . COLOSTRUM PO Take 1.25 g by mouth 2 (two) times daily. 1.25 grams twice daily    . dicyclomine (BENTYL) 20 MG tablet 3 (three) times daily as needed.     . dronabinol (MARINOL) 5 MG capsule Take 1 capsule (5 mg total) by mouth 2 (two) times daily before a meal. 60 capsule 1  . HYDROcodone-acetaminophen (NORCO) 5-325 MG tablet Take 1-2 tablets by mouth every 6 (six) hours as needed for severe pain. (Patient not taking: Reported on 11/09/2016) 60 tablet 0  . lidocaine-prilocaine (EMLA) cream Apply 1 application topically as needed. Apply to portacath 1 1/2 hours - 2 hours prior to procedures as needed. 30 g 1  . meclizine (ANTIVERT) 50 MG tablet Take 1 tablet (50 mg total) by mouth 3 (three) times daily as needed. 30 tablet 0  . Menaquinone-7 (VITAMIN K2) 40 MCG TABS Take by mouth.    . mometasone (ELOCON) 0.1 % lotion Apply topically daily. 60 mL 0  . NON FORMULARY 1 capsule 1 day or 1 dose. Pregnelone, pt reports she takes daily.    . Nutritional Supplements (DHEA) 15-50 MG CAPS Take 1 capsule by mouth 3 (three) times a week.    . nystatin (MYCOSTATIN) 100000 UNIT/ML  suspension Take 5 mLs (500,000 Units total) by mouth 4 (four) times daily. 60 mL 0  . omeprazole (PRILOSEC) 40 MG capsule Take 1 capsule (40 mg total) by mouth 2 (two) times daily before a meal. 60 capsule 11  . ondansetron (ZOFRAN) 8 MG tablet Take 1 tablet (8 mg total) by mouth every 8 (eight) hours as needed for nausea or vomiting. 20 tablet 1  . Prenatal Vit-Fe Fumarate-FA (PRENATAL MULTIVITAMIN) TABS tablet Take 2 tablets by mouth daily at 12 noon.    . triamcinolone cream (KENALOG) 0.1 % Apply topically.    Marland Kitchen VITAMIN K PO Take 1 capsule by mouth daily.     No current facility-administered medications for this visit.    Facility-Administered Medications Ordered in Other Visits  Medication Dose Route Frequency Provider Last Rate Last Dose  . alteplase (CATHFLO ACTIVASE) injection 2 mg  2 mg Intracatheter Once PRN Truitt Merle, MD      . heparin lock flush 100 unit/mL  250 Units Intracatheter Once PRN Truitt Merle, MD      . sodium chloride flush (NS) 0.9 % injection 10 mL  10 mL Intracatheter PRN Truitt Merle, MD   10 mL at 09/09/16 1645    REVIEW OF SYSTEMS:   Constitutional: Denies fevers, chills or abnormal night sweats (+) hair thinning  Eyes: Denies blurriness of vision, double vision or watery eyes Ears, nose, mouth, throat, and face: Denies mucositis or sore throat Respiratory: Denies cough, dyspnea or wheezes (+) dry cough Cardiovascular: Denies palpitation, chest discomfort or lower extremity swelling Gastrointestinal:  Denies heartburn or change in bowel habits (+) abdominal pain Skin: Denies abnormal skin rashes (+) browning on hands (+) bump on groin Lymphatics: Denies new lymphadenopathy or easy bruising Neurological:Denies numbness, tingling or new weaknesses,  Behavioral/Psych: Mood is stable, no new changes  All other systems were reviewed with the patient and are  negative.  PHYSICAL EXAMINATION: ECOG PERFORMANCE STATUS: 1 - Symptomatic but completely ambulatory  GENERAL:alert,  no distress and comfortable SKIN: skin color, texture, turgor are normal, no rashes or significant lesions, several hematomas on extremities EYES: normal, conjunctiva are pink and non-injected, sclera clear OROPHARYNX:no exudate, no erythema and lips, buccal mucosa NECK: supple, thyroid normal size, non-tender, without nodularity LYMPH:  no palpable lymphadenopathy in the cervical, axillary or inguinal LUNGS: clear to auscultation and percussion with normal breathing effort HEART: regular rate & rhythm and no murmurs and no lower extremity edema ABDOMEN:abdomen soft, non-tender and normal bowel sounds Musculoskeletal:no cyanosis of digits and no clubbing  PSYCH: alert & oriented x 3 with fluent speech NEURO: no focal motor/sensory deficits  LABORATORY DATA:  I have reviewed the data as listed CBC Latest Ref Rng & Units 12/06/2016 11/29/2016 11/16/2016  WBC 3.9 - 10.3 10e3/uL 3.5(L) 6.1 4.4  Hemoglobin 11.6 - 15.9 g/dL 10.5(L) 10.1(L) 9.7(L)  Hematocrit 34.8 - 46.6 % 31.3(L) 30.5(L) 29.3(L)  Platelets 145 - 400 10e3/uL 292 201 255   CMP Latest Ref Rng & Units 12/06/2016 11/29/2016 11/16/2016  Glucose 70 - 140 mg/dl 95 83 97  BUN 7.0 - 26.0 mg/dL 9.7 10.8 10.7  Creatinine 0.6 - 1.1 mg/dL 0.7 0.7 0.7  Sodium 136 - 145 mEq/L 142 141 143  Potassium 3.5 - 5.1 mEq/L 4.2 3.8 3.9  Chloride 101 - 111 mmol/L - - -  CO2 22 - 29 mEq/L '26 25 25  '$ Calcium 8.4 - 10.4 mg/dL 8.9 9.2 9.3  Total Protein 6.4 - 8.3 g/dL 6.3(L) 6.2(L) 6.5  Total Bilirubin 0.20 - 1.20 mg/dL 0.22 <0.22 0.24  Alkaline Phos 40 - 150 U/L 49 51 43  AST 5 - 34 U/L '18 14 15  '$ ALT 0 - 55 U/L '18 14 16   '$ CA19.9 (0-35U/ml) 07/28/2016: 36 08/18/2016: 51 09/13/2016: 56 10/19/2016: 43 11/09/2016: 44 12/06/2016: PENDING  PATHOLOGY REPORT  Diagnosis 07/07/2016 FINE NEEDLE ASPIRATION, ENDOSCOPIC, LIVER (SPECIMEN 2 OF 2 COLLECTED 07/07/16): MALIGNANT CELLS CONSISTENT WITH ADENOCARCINOMA.  ADDITIONAL INFORMATION: Per request,  immunohistochemistry was performed. The cells are positive for cytokeratin 7 and negative for cytokeratin 20 and CDX-2. This excludes a colorectal primary. Pancreaticobiliary and upper GI sources remain in the differential. Called to Dr. Burr Medico on 07/28/2016.  Diagnosis 07/07/2016 FINE NEEDLE ASPIRATION, ENDOSCOPIC, GASTRO-HEPATIC LYMPH NODE (SPECIMEN 1 OF 2 COLLECTED 07/07/16): MALIGNANT CELLS CONSISTENT WITH ADENOCARCINOMA, SEE COMMENT. Preliminary Diagnosis Intraoperative Diagnosis: Few atypical glandular clusters - recommend additional material. 4) Adequate ([facility])  RADIOGRAPHIC STUDIES: I have personally reviewed the radiological images as listed and agreed with the findings in the report.  CT CAP W CONTRAST 10/18/16 IMPRESSION: 1. Again noted are multifocal liver metastasis. Overall there has been no significant interval change in overall volume of liver metastases. 2. Similar appearance of upper abdominal adenopathy 3. Slight decrease in size of small pulmonary metastases.  Upper EUS 07/07/2016 Dr. Ardis Hughs  - Endoscopically normal UGI tract (good views with standard gastroscope) - Vaguely bordered 4cm mass in the caudate lobe of liver that is confluent with what appear to be enlarged gastrohepatic lymphnodes (these may represent direct tumor extension however). Preliminary cytology from the lymphnodes shows malignancy, glandular. Preliminary cytology from the liver mass shows the same. Await final cytology results but this is most suspicious for peripheral intrahepatic cholangiocarcinoma with adjacent malignant adenopathy.  COLONOSCOPY 05/22/2014 ENDOSCOPIC IMPRESSION: 1. Sessile polyp measuring 4 mm in size was found in the sigmoid colon; polypectomy was performed with a cold snare  2. The colon mucosa was otherwise normal  ASSESSMENT & PLAN:  51 y.o. African-American female, without significant past medical history, presented with abdominal pain  1. Metastatic intrahepatic  cholangiocarcinoma, with probable lung metastasis, [id number], stage IV -I previously reviewed her CT, MRI, endoscopy findings and her biopsy results with patient and her family members in details. -We have previously reviewed her case in our GI tumor board. Based on the scans and endoscopy findings, this is most consistent with cholangiocarcinoma. Her liver and inguinal biopsy showed adenocarcinoma, IHC was consistent with pancreatic-biliary primary -She has 2 additional small liver lesions and 2 4-83m lung lesions, which are indeterminate but that is suspicious for metastatic disease. -She was seen by surgeon Dr. BBarry Dieneswho thinks she is a not a candidate for surgical resection due to her metastatic disease. -We previously discussed her cancer is incurable at this stage, the goal of therapy is palliation, to prolong life and improve her quality life -she is on first-line cisplatin and gemcitabine, on day 1, 8 every 21 days.  -She has been tolerating chemotherapy moderately well, but had significant neutropenia and thrombocytopenia, cycle 2 chemotherapy was dose reduced -I previously reviewed her restaging CT scan from 10/19/2016, which showed stable disease overall, no other new lesions. She is clinically doing well, her abdominal pain has much improved. -We'll continue chemotherapy with cisplatin and gemcitabine.  -lab reviewed with her. OK to treat, we'll proceed treatment today, her neutropenia has resolved with Neulasta, I'll increase her gemcitabine dose from 800 mg/m to 900 mg/m. -She will get Neulasta day 9. -plan to repeat staging CT scan in early May   2. Abdominal pain, improving -continue hydrocodone/acetaminophen every 4-6 hours as needed for pain control  -Her pain has nearly resolved after she started chemotherapy, she noticed mild left-sided pain in the past few days, -Constipation and management reviewed with patient -I have recommended her to try TUMS  3. Nausea and anorexia and  weight loss  -We previously reviewed her nausea management, she will continue Compazine and Zofran -I previously encouraged her to take a nutritional supplement -We previously discussed appetite stimulant, she is on Marinol, dose was increased to 5 mg twice daily last week, appetite improved, will continue. -she will f/u with dietician  -Previously encouraged her to have a high calorie, high protein diet.  4. Neutropenia  -Secondary to chemotherapy, she is on Neulasta after day 8 chemotherapy. -Continue monitoring  5. Goal of care discussion  -We previously discussed the incurable nature of her cancer, and the overall poor prognosis, especially if she does not have good response to chemotherapy or progress on chemo -The patient understands the goal of care is palliative. -I have recommended DNR/DNI, she will think about it.  6. Anorexia, weight loss -Loss of appetite after cycle 5.  -I encouraged her to eat many small meals throughout the day -If she can not eat, drink nutrient supplements.  -Much improved   Plan -Lab reviewed, adequate for treatment, we'll proceed with Cisplatin / Gemcitabine today. Neulasta on day 9. -refill Marinol  -F/u in 3 weeks. Labs in 1 week -Schedule repeat scan in 5 weeks. -next cycle on April 30.    All questions were answered. The patient knows to call the clinic with any problems, questions or concerns.  I spent 20 minutes counseling the patient face to face. The total time spent in the appointment was 25 minutes and more than 50% was on counseling.  This document serves as a record of services  personally performed by Truitt Merle, MD. It was created on her behalf by Martinique Casey, a trained medical scribe. The creation of this record is based on the scribe's personal observations and the provider's statements to them. This document has been checked and approved by the attending provider.  I have reviewed the above documentation for accuracy and  completeness and I agree with the above.   Truitt Merle, MD 12/06/2016

## 2016-12-06 ENCOUNTER — Ambulatory Visit (HOSPITAL_BASED_OUTPATIENT_CLINIC_OR_DEPARTMENT_OTHER): Payer: Federal, State, Local not specified - PPO

## 2016-12-06 ENCOUNTER — Encounter: Payer: Self-pay | Admitting: Hematology

## 2016-12-06 ENCOUNTER — Other Ambulatory Visit (HOSPITAL_BASED_OUTPATIENT_CLINIC_OR_DEPARTMENT_OTHER): Payer: Federal, State, Local not specified - PPO

## 2016-12-06 ENCOUNTER — Ambulatory Visit (HOSPITAL_BASED_OUTPATIENT_CLINIC_OR_DEPARTMENT_OTHER): Payer: Federal, State, Local not specified - PPO | Admitting: Hematology

## 2016-12-06 VITALS — BP 130/77 | HR 76 | Temp 98.3°F | Resp 16 | Wt 137.5 lb

## 2016-12-06 DIAGNOSIS — J984 Other disorders of lung: Secondary | ICD-10-CM | POA: Diagnosis not present

## 2016-12-06 DIAGNOSIS — D701 Agranulocytosis secondary to cancer chemotherapy: Secondary | ICD-10-CM | POA: Diagnosis not present

## 2016-12-06 DIAGNOSIS — C7801 Secondary malignant neoplasm of right lung: Principal | ICD-10-CM

## 2016-12-06 DIAGNOSIS — R109 Unspecified abdominal pain: Secondary | ICD-10-CM

## 2016-12-06 DIAGNOSIS — C221 Intrahepatic bile duct carcinoma: Secondary | ICD-10-CM

## 2016-12-06 DIAGNOSIS — R110 Nausea: Secondary | ICD-10-CM

## 2016-12-06 DIAGNOSIS — R630 Anorexia: Secondary | ICD-10-CM | POA: Diagnosis not present

## 2016-12-06 DIAGNOSIS — R634 Abnormal weight loss: Secondary | ICD-10-CM | POA: Diagnosis not present

## 2016-12-06 DIAGNOSIS — Z5111 Encounter for antineoplastic chemotherapy: Secondary | ICD-10-CM | POA: Diagnosis not present

## 2016-12-06 LAB — MAGNESIUM: Magnesium: 2 mg/dl (ref 1.5–2.5)

## 2016-12-06 LAB — COMPREHENSIVE METABOLIC PANEL
ALT: 18 U/L (ref 0–55)
AST: 18 U/L (ref 5–34)
Albumin: 3.8 g/dL (ref 3.5–5.0)
Alkaline Phosphatase: 49 U/L (ref 40–150)
Anion Gap: 8 mEq/L (ref 3–11)
BUN: 9.7 mg/dL (ref 7.0–26.0)
CO2: 26 mEq/L (ref 22–29)
Calcium: 8.9 mg/dL (ref 8.4–10.4)
Chloride: 109 mEq/L (ref 98–109)
Creatinine: 0.7 mg/dL (ref 0.6–1.1)
EGFR: >90 mL/min/{1.73_m2} (ref >90)
Glucose: 95 mg/dl (ref 70–140)
Potassium: 4.2 mEq/L (ref 3.5–5.1)
Sodium: 142 mEq/L (ref 136–145)
Total Bilirubin: 0.22 mg/dL (ref 0.20–1.20)
Total Protein: 6.3 g/dL — ABNORMAL LOW (ref 6.4–8.3)

## 2016-12-06 LAB — CBC WITH DIFFERENTIAL/PLATELET
BASO%: 1 % (ref 0.0–2.0)
Basophils Absolute: 0 10*3/uL (ref 0.0–0.1)
EOS%: 2.9 % (ref 0.0–7.0)
Eosinophils Absolute: 0.1 10*3/uL (ref 0.0–0.5)
HCT: 31.3 % — ABNORMAL LOW (ref 34.8–46.6)
HGB: 10.5 g/dL — ABNORMAL LOW (ref 11.6–15.9)
LYMPH%: 38.7 % (ref 14.0–49.7)
MCH: 31 pg (ref 25.1–34.0)
MCHC: 33.5 g/dL (ref 31.5–36.0)
MCV: 92.5 fL (ref 79.5–101.0)
MONO#: 0.3 10*3/uL (ref 0.1–0.9)
MONO%: 9.7 % (ref 0.0–14.0)
NEUT#: 1.7 10*3/uL (ref 1.5–6.5)
NEUT%: 47.7 % (ref 38.4–76.8)
Platelets: 292 10*3/uL (ref 145–400)
RBC: 3.38 10*6/uL — ABNORMAL LOW (ref 3.70–5.45)
RDW: 17.2 % — ABNORMAL HIGH (ref 11.2–14.5)
WBC: 3.5 10*3/uL — ABNORMAL LOW (ref 3.9–10.3)
lymph#: 1.3 10*3/uL (ref 0.9–3.3)

## 2016-12-06 MED ORDER — SODIUM CHLORIDE 0.9 % IV SOLN
900.0000 mg/m2 | Freq: Once | INTRAVENOUS | Status: AC
  Administered 2016-12-06: 1558 mg via INTRAVENOUS
  Filled 2016-12-06: qty 40.98

## 2016-12-06 MED ORDER — HEPARIN SOD (PORK) LOCK FLUSH 100 UNIT/ML IV SOLN
500.0000 [IU] | Freq: Once | INTRAVENOUS | Status: AC | PRN
  Administered 2016-12-06: 500 [IU]
  Filled 2016-12-06: qty 5

## 2016-12-06 MED ORDER — PALONOSETRON HCL INJECTION 0.25 MG/5ML
INTRAVENOUS | Status: AC
  Filled 2016-12-06: qty 5

## 2016-12-06 MED ORDER — PALONOSETRON HCL INJECTION 0.25 MG/5ML
0.2500 mg | Freq: Once | INTRAVENOUS | Status: AC
  Administered 2016-12-06: 0.25 mg via INTRAVENOUS

## 2016-12-06 MED ORDER — SODIUM CHLORIDE 0.9% FLUSH
10.0000 mL | INTRAVENOUS | Status: DC | PRN
  Administered 2016-12-06: 10 mL
  Filled 2016-12-06: qty 10

## 2016-12-06 MED ORDER — DRONABINOL 5 MG PO CAPS: 5.0000 mg | ORAL_CAPSULE | Freq: Two times a day (BID) | ORAL | 1 refills | Status: DC

## 2016-12-06 MED ORDER — SODIUM CHLORIDE 0.9 % IV SOLN
25.0000 mg/m2 | Freq: Once | INTRAVENOUS | Status: AC
  Administered 2016-12-06: 43 mg via INTRAVENOUS
  Filled 2016-12-06: qty 43

## 2016-12-06 MED ORDER — SODIUM CHLORIDE 0.9 % IV SOLN
Freq: Once | INTRAVENOUS | Status: AC
  Administered 2016-12-06: 12:00:00 via INTRAVENOUS
  Filled 2016-12-06: qty 5

## 2016-12-06 MED ORDER — SODIUM CHLORIDE 0.9 % IV SOLN
Freq: Once | INTRAVENOUS | Status: AC
  Administered 2016-12-06: 10:00:00 via INTRAVENOUS

## 2016-12-06 MED ORDER — POTASSIUM CHLORIDE 2 MEQ/ML IV SOLN
Freq: Once | INTRAVENOUS | Status: AC
  Administered 2016-12-06: 10:00:00 via INTRAVENOUS
  Filled 2016-12-06: qty 10

## 2016-12-06 NOTE — Patient Instructions (Signed)
[facility] Discharge Instructions for Patients Receiving Chemotherapy  Today you received the following chemotherapy agents:  Gemzar and Cisplatin  To help prevent nausea and vomiting after your treatment, we encourage you to take your nausea medication as prescribed.   If you develop nausea and vomiting that is not controlled by your nausea medication, call the clinic.   BELOW ARE SYMPTOMS THAT SHOULD BE REPORTED IMMEDIATELY:  *FEVER GREATER THAN 100.5 F  *CHILLS WITH OR WITHOUT FEVER  NAUSEA AND VOMITING THAT IS NOT CONTROLLED WITH YOUR NAUSEA MEDICATION  *UNUSUAL SHORTNESS OF BREATH  *UNUSUAL BRUISING OR BLEEDING  TENDERNESS IN MOUTH AND THROAT WITH OR WITHOUT PRESENCE OF ULCERS  *URINARY PROBLEMS  *BOWEL PROBLEMS  UNUSUAL RASH Items with * indicate a potential emergency and should be followed up as soon as possible.  Feel free to call the clinic you have any questions or concerns. The clinic phone number is [phone].  Please show the [facility] at check-in to the Emergency Department and triage nurse.

## 2016-12-07 ENCOUNTER — Ambulatory Visit: Payer: Federal, State, Local not specified - PPO

## 2016-12-07 LAB — CANCER ANTIGEN 19-9: CA 19-9: 41 U/mL — ABNORMAL HIGH (ref 0–35)

## 2016-12-08 ENCOUNTER — Telehealth: Payer: Self-pay | Admitting: Hematology

## 2016-12-08 NOTE — Telephone Encounter (Signed)
Patient bypassed scheduling on 12/06/16. Appointments scheduled per 12/06/16 los. Tx dates changed to 12/29/16 & 01/05/17, per Dr Burr Medico. Appointments confirmed with patient. 12/08/16

## 2016-12-13 ENCOUNTER — Other Ambulatory Visit: Payer: Federal, State, Local not specified - PPO

## 2016-12-13 ENCOUNTER — Other Ambulatory Visit (HOSPITAL_BASED_OUTPATIENT_CLINIC_OR_DEPARTMENT_OTHER): Payer: Federal, State, Local not specified - PPO

## 2016-12-13 ENCOUNTER — Telehealth: Payer: Self-pay | Admitting: Hematology

## 2016-12-13 ENCOUNTER — Ambulatory Visit: Payer: Federal, State, Local not specified - PPO

## 2016-12-13 ENCOUNTER — Ambulatory Visit (HOSPITAL_BASED_OUTPATIENT_CLINIC_OR_DEPARTMENT_OTHER): Payer: Federal, State, Local not specified - PPO

## 2016-12-13 VITALS — BP 122/74 | HR 71 | Temp 98.2°F | Resp 16

## 2016-12-13 DIAGNOSIS — C221 Intrahepatic bile duct carcinoma: Secondary | ICD-10-CM | POA: Diagnosis not present

## 2016-12-13 DIAGNOSIS — Z5111 Encounter for antineoplastic chemotherapy: Secondary | ICD-10-CM

## 2016-12-13 DIAGNOSIS — C7801 Secondary malignant neoplasm of right lung: Principal | ICD-10-CM

## 2016-12-13 LAB — COMPREHENSIVE METABOLIC PANEL
ALT: 17 U/L (ref 0–55)
AST: 16 U/L (ref 5–34)
Albumin: 3.9 g/dL (ref 3.5–5.0)
Alkaline Phosphatase: 35 U/L — ABNORMAL LOW (ref 40–150)
Anion Gap: 8 mEq/L (ref 3–11)
BUN: 9.3 mg/dL (ref 7.0–26.0)
CO2: 25 mEq/L (ref 22–29)
Calcium: 9.1 mg/dL (ref 8.4–10.4)
Chloride: 107 mEq/L (ref 98–109)
Creatinine: 0.6 mg/dL (ref 0.6–1.1)
EGFR: >90 mL/min/{1.73_m2} (ref >90)
Glucose: 93 mg/dl (ref 70–140)
Potassium: 4.1 mEq/L (ref 3.5–5.1)
Sodium: 140 mEq/L (ref 136–145)
Total Bilirubin: <0.22 mg/dL (ref 0.20–1.20)
Total Protein: 6.5 g/dL (ref 6.4–8.3)

## 2016-12-13 LAB — CBC WITH DIFFERENTIAL/PLATELET
BASO%: 0.7 % (ref 0.0–2.0)
Basophils Absolute: 0 10*3/uL (ref 0.0–0.1)
EOS%: 0.9 % (ref 0.0–7.0)
Eosinophils Absolute: 0 10*3/uL (ref 0.0–0.5)
HCT: 30.5 % — ABNORMAL LOW (ref 34.8–46.6)
HGB: 10.3 g/dL — ABNORMAL LOW (ref 11.6–15.9)
LYMPH%: 61 % — ABNORMAL HIGH (ref 14.0–49.7)
MCH: 30.8 pg (ref 25.1–34.0)
MCHC: 33.7 g/dL (ref 31.5–36.0)
MCV: 91.3 fL (ref 79.5–101.0)
MONO#: 0.1 10*3/uL (ref 0.1–0.9)
MONO%: 2.9 % (ref 0.0–14.0)
NEUT#: 1.1 10*3/uL — ABNORMAL LOW (ref 1.5–6.5)
NEUT%: 34.5 % — ABNORMAL LOW (ref 38.4–76.8)
Platelets: 151 10*3/uL (ref 145–400)
RBC: 3.34 10*6/uL — ABNORMAL LOW (ref 3.70–5.45)
RDW: 15.2 % — ABNORMAL HIGH (ref 11.2–14.5)
WBC: 3.3 10*3/uL — ABNORMAL LOW (ref 3.9–10.3)
lymph#: 2 10*3/uL (ref 0.9–3.3)

## 2016-12-13 LAB — MAGNESIUM: Magnesium: 2 mg/dl (ref 1.5–2.5)

## 2016-12-13 MED ORDER — SODIUM CHLORIDE 0.9 % IV SOLN
25.0000 mg/m2 | Freq: Once | INTRAVENOUS | Status: AC
  Administered 2016-12-13: 43 mg via INTRAVENOUS
  Filled 2016-12-13: qty 43

## 2016-12-13 MED ORDER — POTASSIUM CHLORIDE 2 MEQ/ML IV SOLN
Freq: Once | INTRAVENOUS | Status: AC
  Administered 2016-12-13: 11:00:00 via INTRAVENOUS
  Filled 2016-12-13: qty 10

## 2016-12-13 MED ORDER — PALONOSETRON HCL INJECTION 0.25 MG/5ML
INTRAVENOUS | Status: AC
  Filled 2016-12-13: qty 5

## 2016-12-13 MED ORDER — HEPARIN SOD (PORK) LOCK FLUSH 100 UNIT/ML IV SOLN
250.0000 [IU] | Freq: Once | INTRAVENOUS | Status: DC | PRN
  Filled 2016-12-13: qty 5

## 2016-12-13 MED ORDER — SODIUM CHLORIDE 0.9% FLUSH
10.0000 mL | INTRAVENOUS | Status: DC | PRN
  Administered 2016-12-13: 10 mL
  Filled 2016-12-13: qty 10

## 2016-12-13 MED ORDER — HEPARIN SOD (PORK) LOCK FLUSH 100 UNIT/ML IV SOLN
500.0000 [IU] | Freq: Once | INTRAVENOUS | Status: AC | PRN
  Administered 2016-12-13: 500 [IU]
  Filled 2016-12-13: qty 5

## 2016-12-13 MED ORDER — PALONOSETRON HCL INJECTION 0.25 MG/5ML
0.2500 mg | Freq: Once | INTRAVENOUS | Status: AC
  Administered 2016-12-13: 0.25 mg via INTRAVENOUS

## 2016-12-13 MED ORDER — SODIUM CHLORIDE 0.9 % IV SOLN
900.0000 mg/m2 | Freq: Once | INTRAVENOUS | Status: AC
  Administered 2016-12-13: 1558 mg via INTRAVENOUS
  Filled 2016-12-13: qty 40.98

## 2016-12-13 MED ORDER — FOSAPREPITANT DIMEGLUMINE INJECTION 150 MG
Freq: Once | INTRAVENOUS | Status: AC
  Administered 2016-12-13: 14:00:00 via INTRAVENOUS
  Filled 2016-12-13: qty 5

## 2016-12-13 MED ORDER — SODIUM CHLORIDE 0.9 % IV SOLN
Freq: Once | INTRAVENOUS | Status: AC
Start: 2016-12-13 — End: 2016-12-13
  Administered 2016-12-13: 13:00:00 via INTRAVENOUS

## 2016-12-13 NOTE — Progress Notes (Signed)
Per Dr. Burr Medico okay to tx today with cisplatin and gemzar despite ANC of 1.1. Discussed neutropenic precautions with pt. Pt verbalized understanding.

## 2016-12-13 NOTE — Telephone Encounter (Signed)
Patient stopped by scheduling for a copy of the appointment schedule. 12/13/16

## 2016-12-13 NOTE — Patient Instructions (Addendum)
[place] Discharge Instructions for Patients Receiving Chemotherapy  Today you received the following chemotherapy agents:  Gemzar and Cisplatin  To help prevent nausea and vomiting after your treatment, we encourage you to take your nausea medication as prescribed.   If you develop nausea and vomiting that is not controlled by your nausea medication, call the clinic.   BELOW ARE SYMPTOMS THAT SHOULD BE REPORTED IMMEDIATELY:  *FEVER GREATER THAN 100.5 F  *CHILLS WITH OR WITHOUT FEVER  NAUSEA AND VOMITING THAT IS NOT CONTROLLED WITH YOUR NAUSEA MEDICATION  *UNUSUAL SHORTNESS OF BREATH  *UNUSUAL BRUISING OR BLEEDING  TENDERNESS IN MOUTH AND THROAT WITH OR WITHOUT PRESENCE OF ULCERS  *URINARY PROBLEMS  *BOWEL PROBLEMS  UNUSUAL RASH Items with * indicate a potential emergency and should be followed up as soon as possible.  Feel free to call the clinic you have any questions or concerns. The clinic phone number is [phone].  Please show the [place] at check-in to the Emergency Department and triage nurse.   Neutropenia Neutropenia is a condition that occurs when you have a lower-than-normal level of a type of white blood cell (neutrophil) in your body. Neutrophils are made in the spongy center of large bones (bone marrow) and they fight infections. Neutrophils are your body's main defense against bacterial and fungal infections. The fewer neutrophils you have and the longer your body remains without them, the greater your risk of getting a severe infection. What are the causes? This condition can occur if your body uses up or destroys neutrophils faster than your bone marrow can make them. This problem may happen because of:  Bacterial or fungal infection.  Allergic disorders.  Reactions to some medicines.  Autoimmune disease.  An enlarged spleen. This condition can also occur if your bone marrow does not produce enough neutrophils. This  problem may be caused by:  Cancer.  Cancer treatments, such as radiation or chemotherapy.  Viral infections.  Medicines, such as phenytoin.  Vitamin B12 deficiency.  Diseases of the bone marrow.  Environmental toxins, such as insecticides. What are the signs or symptoms? This condition does not usually cause symptoms. If symptoms are present, they are usually caused by an underlying infection. Symptoms of an infection may include:  Fever.  Chills.  Swollen glands.  Oral or anal ulcers.  Cough and shortness of breath.  Rash.  Skin infection.  Fatigue. How is this diagnosed? Your health care provider may suspect neutropenia if you have:  A condition that may cause neutropenia.  Symptoms of infection, especially fever.  Frequent and unusual infections. You will have a medical history and physical exam. Tests will also be done, such as:  A complete blood count (CBC).  A procedure to collect a sample of bone marrow for examination (bone marrow biopsy).  A chest X-ray.  A urine culture.  A blood culture. How is this treated? Treatment depends on the underlying cause and severity of your condition. Mild neutropenia may not require treatment. Treatment may include medicines, such as:  Antibiotic medicine given through an IV tube.  Antiviral medicines.  Antifungal medicines.  A medicine to increase neutrophil production (colony-stimulating factor). You may get this drug through an IV tube or by injection.  Steroids given through an IV tube. If an underlying condition is causing neutropenia, you may need treatment for that condition. If medicines you are taking are causing neutropenia, your health care provider may have you stop taking those medicines. Follow these instructions  at home: Medicines   Take over-the-counter and prescription medicines only as told by your health care provider.  Get a seasonal flu shot (influenza vaccine). Lifestyle   Do not  eat unpasteurized foods.Do not eat unwashed raw fruits or vegetables.  Avoid exposure to groups of people or children.  Avoid being around people who are sick.  Avoid being around dirt or dust, such as in construction areas or gardens.  Do not provide direct care for pets. Avoid animal droppings. Do not clean litter boxes and bird cages. Hygiene    Bathe daily.  Clean the area between the genitals and the anus (perineal area) after you urinate or have a bowel movement. If you are female, wipe from front to back.  Brush your teeth with a soft toothbrush before and after meals.  Do not use a razor that has a blade. Use an electric razor to remove hair.  Wash your hands often. Make sure others who come in contact with you also wash their hands. If soap and water are not available, use hand sanitizer. General instructions   Do not have sex unless your health care provider has approved.  Take actions to avoid cuts and burns. For example:  Be cautious when you use knives. Always cut away from yourself.  Keep knives in protective sheaths or guards when not in use.  Use oven mitts when you cook with a hot stove, oven, or grill.  Stand a safe distance away from open fires.  Avoid people who received a vaccine in the past 30 days if that vaccine contained a live version of the germ (live vaccine). You should not get a live vaccine. Common live vaccines are varicella, measles, mumps, and rubella.  Do not share food utensils.  Do not use tampons, enemas, or rectal suppositories unless your health care provider has approved.  Keep all appointments as told by your health care provider. This is important. Contact a health care provider if:  You have a fever.  You have chills or you start to shake.  You have:  A sore throat.  A warm, red, or tender area on your skin.  A cough.  Frequent or painful urination.  Vaginal discharge or itching.  You develop:  Sores in your  mouth or anus.  Swollen lymph nodes.  Red streaks on the skin.  A rash.  You feel:  Nauseous or you vomit.  Very fatigued.  Short of breath. This information is not intended to replace advice given to you by your health care provider. Make sure you discuss any questions you have with your health care provider. Document Released: 02/05/2002 Document Revised: 01/22/2016 Document Reviewed: 02/26/2015 Elsevier Interactive Patient Education  2017 [employer].

## 2016-12-13 NOTE — Patient Instructions (Signed)

## 2016-12-14 ENCOUNTER — Ambulatory Visit (HOSPITAL_BASED_OUTPATIENT_CLINIC_OR_DEPARTMENT_OTHER): Payer: Federal, State, Local not specified - PPO

## 2016-12-14 DIAGNOSIS — C7801 Secondary malignant neoplasm of right lung: Principal | ICD-10-CM

## 2016-12-14 DIAGNOSIS — Z5189 Encounter for other specified aftercare: Secondary | ICD-10-CM | POA: Diagnosis not present

## 2016-12-14 DIAGNOSIS — C221 Intrahepatic bile duct carcinoma: Secondary | ICD-10-CM

## 2016-12-14 MED ORDER — PEGFILGRASTIM INJECTION 6 MG/0.6ML ~~LOC~~
6.0000 mg | PREFILLED_SYRINGE | Freq: Once | SUBCUTANEOUS | Status: AC
  Administered 2016-12-14: 6 mg via SUBCUTANEOUS
  Filled 2016-12-14: qty 0.6

## 2016-12-28 NOTE — Progress Notes (Signed)
[place]  Telephone:(336) [phone] Fax:(336) [phone]  Clinic Follow Up Note   Patient Care Team: [name], DO as PCP - General Jerene Bears, MD as Consulting Physician (Gastroenterology) 12/29/2016  CHIEF COMPLAINTS:  Follow up metastatic intrahepatic cholangiocarcinoma  Oncology History   Metastatic cholangiocarcinoma to lung [facility])   Staging form: Perihilar Bile Ducts, AJCC 7th Edition   - Clinical stage from 07/07/2016: Stage IVB (T2b, N1, M1) - Signed by Truitt Merle, MD on 07/28/2016      Metastatic cholangiocarcinoma to lung ([place])   05/22/2014 Procedure    Routine screening colonoscopy showed a sessile polyp measuring 4 mm in the sigmoid colon, removed, otherwise negative.      06/29/2016 Imaging    CT chest, abdomen and pelvis showed 2 indeterminant nodule in left and the right lung, 4-14m, indeterminate a hypovascular liver lesions largest in the caudate lobe measuring 2.6 cm, mild abdominal lymphadenopathy in the gastrohepatic ligament and portocaval space.      07/02/2016 Imaging    Abdominal MRI with and without contrast showed 2 lobular lesions in the caudate lobe, and is regular lymph nodes in the gastrohepatic ligament which is adjacent to the liver lesion, suspicious for malignancy. 2 small lesions in the right hepatic lobe are indeterminate, but concerning for metastasis.      07/07/2016 Procedure    EGD was negative, EUS biopsy of the liver and adjacent lymph node      07/07/2016 Initial Diagnosis    [place]      07/07/2016 Initial Biopsy    Fine-needle aspiration of the liver lesion in caudate lobe and adjacent lymph nodes both showed metastatic adenocarcinoma.      08/11/2016 -  Chemotherapy    Cisplatin 25 mg/m, gemcitabine 1000 mg/m, on day one and 8, every 21 days        10/18/2016 Imaging    CT CAP  IMPRESSION: 1. Again noted are multifocal liver metastasis. Overall there has been no  significant interval change in overall volume of liver metastases. 2. Similar appearance of upper abdominal adenopathy 3. Slight decrease in size of small pulmonary metastases.       HISTORY OF PRESENTING ILLNESS: 07/28/17 Nancy Berkshire51y.o. female is here because of Her recently diagnosed metastatic glandular carcinoma. She is accompanied by her husband and mother to my clinic today.  She has been having RUQ abdominal pain since mid 04/2016, she was seen by PCP and was treated for gas with GI cocktail and pepcide, which she did not help much. Her pain got worse, and radiates to right shoulder, she denies significant nausea, or bloating, BM normal, no fever, cough or dyspnea. She recently noticed mild chest discomfort in the low sternum area. She initially had the lab, ultrasound done by her primary care physician, which was unrevealing. She was referred to GI Dr. PHilarie Fredricksonand underwent EGD and CT scan, which showed a lobulated mass in the caudate lobe of liver, with at adjacent adenopathy. He underwent EUS and fine-needle biopsy of the liver mass and lymph nodes, all reviewed adenocarcinoma.  She has lost about 7lbs in the past 3 months. She has mild fatigue, but able to function at home. She has stopped working due to her abdominal pain and fatigue. She takes Norco every 1-3 times a day, and her pain seems not well controlled.  CURRENT THERAPY: Cisplatin and gemcitabine on Day 1, 8 every 21 days, with neulasta on day 9, Gemcitabine  dosed reduced to 800 mg/m due to cytopenias, increased to '900mg'$ /m2 on 12/06/2016.  INTERIM HISTORY:  Nancy [name] returns for follow up and cycle 7 of treatment. She presents today with her family and she reports purposeful weight gain and fair appetite. Since her last treatment of increased dosage she reports more sensation and discomfort "similar to feeling like her period is coming on" but with no bleeding in her pubic region. She reports bone pain settling in her  shoulder and superior spine. Denies bleeding and reports regular bowl movements. She reports taking multivitamins. They are planning a vacation in late May. She reports her hair is still thinning. She reports doing regular exercising. She expressed her concern for a UTI when traveling to the [place].      MEDICAL HISTORY:  Past Medical History:  Diagnosis Date  . Allergy    SEASONAL  . Anemia    HIGH SCHOOL  . Atopic eczema   . Fibrocystic breast disease   . GERD (gastroesophageal reflux disease)   . Hx of migraines    seasonal   . Pneumonia    6 years ago   . TIA (transient ischemic attack)     SURGICAL HISTORY: Past Surgical History:  Procedure Laterality Date  . EUS N/A 07/07/2016   Procedure: UPPER ENDOSCOPIC ULTRASOUND (EUS) RADIAL;  Surgeon: [name], MD;  Location: [facility] ENDOSCOPY;  Service: Endoscopy;  Laterality: N/A;  . IR GENERIC HISTORICAL  08/03/2016   IR US GUIDE VASC ACCESS RIGHT 08/03/2016 Corrie Mckusick, DO [facility]-INTER[facility] RAD  . IR GENERIC HISTORICAL  08/03/2016   IR FLUORO GUIDE PORT INSERTION RIGHT 08/03/2016 Corrie Mckusick, DO [facility]  . MYOMECTOMY  2002    SOCIAL HISTORY: Social History   Social History  . Marital status: Married    Spouse name: N/A  . Number of children: 0  . Years of education: 32   Occupational History  . [employer]  .  [employer]   Social History Main Topics  . Smoking status: Never Smoker  . Smokeless tobacco: Never Used  . Alcohol use Yes     Comment: maybe 3 times a year  . Drug use: No  . Sexual activity: Yes    Partners: Male   Other Topics Concern  . Not on file   Social History Narrative  . No narrative on file    FAMILY HISTORY: Family History  Problem Relation Age of Onset  . Gout Mother   . Diabetes Mother   . Hypertension Father   . Diabetes Father   . Breast cancer Paternal Aunt   . Cancer Maternal Grandfather     throat cancer   . Cancer Cousin 106    GI cancer   . Thyroid disease Neg Hx      ALLERGIES:  is allergic to penicillins and lipitor [atorvastatin].  MEDICATIONS:  Current Outpatient Prescriptions  Medication Sig Dispense Refill  . ALPRAZolam (XANAX) 0.5 MG tablet Take 1 tablet (0.5 mg total) by mouth as needed for anxiety. 10 tablet 0  . aspirin 81 MG tablet Take 81 mg by mouth daily.    Marland Kitchen CARAFATE 1 GM/10ML suspension TAKE 10 MLS (1 G TOTAL) BY MOUTH 4 (FOUR) TIMES DAILY - WITH MEALS AND AT BEDTIME. 420 mL 1  . cetirizine (ZYRTEC) 10 MG tablet Take 10 mg by mouth daily as needed. For allergies     . Cholecalciferol (VITAMIN D3) 5000 UNITS CAPS Take 1 capsule by mouth daily.    Marland Kitchen  Ciclopirox 1 % shampoo Use as directed 2x a week for up to 4 weeks 120 mL 1  . COLOSTRUM PO Take 1.25 g by mouth 2 (two) times daily. 1.25 grams twice daily    . dronabinol (MARINOL) 5 MG capsule Take 1 capsule (5 mg total) by mouth 2 (two) times daily before a meal. 60 capsule 1  . HYDROcodone-acetaminophen (NORCO) 5-325 MG tablet Take 1-2 tablets by mouth every 6 (six) hours as needed for severe pain. 60 tablet 0  . lidocaine-prilocaine (EMLA) cream Apply 1 application topically as needed. Apply to portacath 1 1/2 hours - 2 hours prior to procedures as needed. 30 g 1  . meclizine (ANTIVERT) 50 MG tablet Take 1 tablet (50 mg total) by mouth 3 (three) times daily as needed. 30 tablet 0  . Menaquinone-7 (VITAMIN K2) 40 MCG TABS Take by mouth.    . mometasone (ELOCON) 0.1 % lotion Apply topically daily. 60 mL 0  . NON FORMULARY 1 capsule 1 day or 1 dose. Pregnelone, pt reports she takes daily.    . Nutritional Supplements (DHEA) 15-50 MG CAPS Take 1 capsule by mouth 3 (three) times a week.    . nystatin (MYCOSTATIN) 100000 UNIT/ML suspension Take 5 mLs (500,000 Units total) by mouth 4 (four) times daily. 60 mL 0  . omeprazole (PRILOSEC) 40 MG capsule Take 1 capsule (40 mg total) by mouth 2 (two) times daily before a meal. 60 capsule 11  . ondansetron (ZOFRAN) 8 MG tablet Take 1 tablet (8 mg  total) by mouth every 8 (eight) hours as needed for nausea or vomiting. 20 tablet 1  . Prenatal Vit-Fe Fumarate-FA (PRENATAL MULTIVITAMIN) TABS tablet Take 2 tablets by mouth daily at 12 noon.    . triamcinolone cream (KENALOG) 0.1 % Apply topically.    Marland Kitchen VITAMIN K PO Take 1 capsule by mouth daily.    Marland Kitchen dicyclomine (BENTYL) 20 MG tablet 3 (three) times daily as needed.     Marland Kitchen levofloxacin (LEVAQUIN) 500 MG tablet Take 1 tablet (500 mg total) by mouth daily. 7 tablet 0   No current facility-administered medications for this visit.    Facility-Administered Medications Ordered in Other Visits  Medication Dose Route Frequency Provider Last Rate Last Dose  . alteplase (CATHFLO ACTIVASE) injection 2 mg  2 mg Intracatheter Once PRN Truitt Merle, MD      . heparin lock flush 100 unit/mL  250 Units Intracatheter Once PRN Truitt Merle, MD      . sodium chloride flush (NS) 0.9 % injection 10 mL  10 mL Intracatheter PRN Truitt Merle, MD   10 mL at 09/09/16 1645    REVIEW OF SYSTEMS:  Constitutional: Denies fevers, chills or abnormal night sweats (+) hair thinning  Eyes: Denies blurriness of vision, double vision or watery eyes Ears, nose, mouth, throat, and face: Denies mucositis or sore throat Respiratory: Denies cough, dyspnea or wheezes (+) dry cough Cardiovascular: Denies palpitation, chest discomfort or lower extremity swelling Gastrointestinal:  Denies heartburn or change in bowel habits (+) abdominal discomfort Skin: Denies abnormal skin rashes (+) browning on hands (+) bump on groin Lymphatics: Denies new lymphadenopathy or easy bruising Neurological:Denies numbness, tingling or new weaknesses,  Behavioral/Psych: Mood is stable, no new changes  Musculoskeletal: (+) pain in shoulder and superior spine due to infusion medication All other systems were reviewed with the patient and are negative.  PHYSICAL EXAMINATION: ECOG PERFORMANCE STATUS: 1 - Symptomatic but completely ambulatory  GENERAL:alert, no  distress and comfortable  SKIN: skin color, texture, turgor are normal, no rashes or significant lesions, several hematomas on extremities  EYES: normal, conjunctiva are pink and non-injected, sclera clear OROPHARYNX:no exudate, no erythema and lips, buccal mucosa NECK: supple, thyroid normal size, non-tender, without nodularity LYMPH:  no palpable lymphadenopathy in the cervical, axillary or inguinal LUNGS: clear to auscultation and percussion with normal breathing effort HEART: regular rate & rhythm and no murmurs and no lower extremity edema ABDOMEN:abdomen soft, non-tender and normal bowel sounds Musculoskeletal:no cyanosis of digits and no clubbing  PSYCH: alert & oriented x 3 with fluent speech NEURO: no focal motor/sensory deficits  LABORATORY DATA:  I have reviewed the data as listed CBC Latest Ref Rng & Units 12/29/2016 12/13/2016 12/06/2016  WBC 3.9 - 10.3 10e3/uL 4.8 3.3(L) 3.5(L)  Hemoglobin 11.6 - 15.9 g/dL 9.9(L) 10.3(L) 10.5(L)  Hematocrit 34.8 - 46.6 % 30.5(L) 30.5(L) 31.3(L)  Platelets 145 - 400 10e3/uL 247 151 292   CMP Latest Ref Rng & Units 12/29/2016 12/13/2016 12/06/2016  Glucose 70 - 140 mg/dl 93 93 95  BUN 7.0 - 26.0 mg/dL 13.2 9.3 9.7  Creatinine 0.6 - 1.1 mg/dL 0.7 0.6 0.7  Sodium 136 - 145 mEq/L 143 140 142  Potassium 3.5 - 5.1 mEq/L 4.0 4.1 4.2  Chloride 101 - 111 mmol/L - - -  CO2 22 - 29 mEq/L '26 25 26  '$ Calcium 8.4 - 10.4 mg/dL 9.0 9.1 8.9  Total Protein 6.4 - 8.3 g/dL 6.3(L) 6.5 6.3(L)  Total Bilirubin 0.20 - 1.20 mg/dL <0.22 <0.22 0.22  Alkaline Phos 40 - 150 U/L 53 35(L) 49  AST 5 - 34 U/L '23 16 18  '$ ALT 0 - 55 U/L '23 17 18   '$ CA19.9 (0-35U/ml) 07/28/2016: 36 08/18/2016: 51 09/13/2016: 56 10/19/2016: 43 11/09/2016: 44 12/06/2016: 41   PATHOLOGY REPORT  Diagnosis 07/07/2016 FINE NEEDLE ASPIRATION, ENDOSCOPIC, LIVER (SPECIMEN 2 OF 2 COLLECTED 07/07/16): MALIGNANT CELLS CONSISTENT WITH ADENOCARCINOMA.  ADDITIONAL INFORMATION: Per request,  immunohistochemistry was performed. The cells are positive for cytokeratin 7 and negative for cytokeratin 20 and CDX-2. This excludes a colorectal primary. Pancreaticobiliary and upper GI sources remain in the differential. Called to Dr. Burr Medico on 07/28/2016.  Diagnosis 07/07/2016 FINE NEEDLE ASPIRATION, ENDOSCOPIC, GASTRO-HEPATIC LYMPH NODE (SPECIMEN 1 OF 2 COLLECTED 07/07/16): MALIGNANT CELLS CONSISTENT WITH ADENOCARCINOMA, SEE COMMENT. Preliminary Diagnosis Intraoperative Diagnosis: Few atypical glandular clusters - recommend additional material. 4) Adequate ([facility])  RADIOGRAPHIC STUDIES: I have personally reviewed the radiological images as listed and agreed with the findings in the report.  CT CAP W CONTRAST 10/18/16 IMPRESSION: 1. Again noted are multifocal liver metastasis. Overall there has been no significant interval change in overall volume of liver metastases. 2. Similar appearance of upper abdominal adenopathy 3. Slight decrease in size of small pulmonary metastases.  Upper EUS 07/07/2016 Dr. Ardis Hughs  - Endoscopically normal UGI tract (good views with standard gastroscope) - Vaguely bordered 4cm mass in the caudate lobe of liver that is confluent with what appear to be enlarged gastrohepatic lymphnodes (these may represent direct tumor extension however). Preliminary cytology from the lymphnodes shows malignancy, glandular. Preliminary cytology from the liver mass shows the same. Await final cytology results but this is most suspicious for peripheral intrahepatic cholangiocarcinoma with adjacent malignant adenopathy.  COLONOSCOPY 05/22/2014 ENDOSCOPIC IMPRESSION: 1. Sessile polyp measuring 4 mm in size was found in the sigmoid colon; polypectomy was performed with a cold snare 2. The colon mucosa was otherwise normal  ASSESSMENT & PLAN:  51 y.o. African-American female, without significant  past medical history, presented with abdominal pain  1. Metastatic intrahepatic  cholangiocarcinoma, with probable lung metastasis, [id number], stage IV -I previously reviewed her CT, MRI, endoscopy findings and her biopsy results with patient and her family members in details. -We have previously reviewed her case in our GI tumor board. Based on the scans and endoscopy findings, this is most consistent with cholangiocarcinoma. Her liver and inguinal biopsy showed adenocarcinoma, IHC was consistent with pancreatic-biliary primary -She has 2 additional small liver lesions and 2 4-70m lung lesions, which are indeterminate but that is suspicious for metastatic disease. -She was seen by surgeon Dr. BBarry Dieneswho thinks she is a not a candidate for surgical resection due to her metastatic disease. -We previously discussed her cancer is incurable at this stage, the goal of therapy is palliation, to prolong life and improve her quality life -she is on first-line cisplatin and gemcitabine, on day 1, 8 every 21 days.  -She has been tolerating chemotherapy moderately well, but had significant neutropenia and thrombocytopenia, cycle 2 chemotherapy was dose reduced -I previously reviewed her restaging CT scan from 10/19/2016, which showed stable disease overall, no other new lesions. She is clinically doing well, her abdominal pain has much improved. -We'll continue chemotherapy with cisplatin and gemcitabine.  -lab was previously reviewed with her. OK to treat, we'll proceed treatment today, her neutropenia has resolved with Neulasta, I have increase her gemcitabine dose from 800 mg/m to 900 mg/m, tolerating well  -Due to planned vacation we will do her scheduled CT scan on 5/17.  2. Abdominal pain, improving -continue hydrocodone/acetaminophen every 4-6 hours as needed for pain control  -Her pain has nearly resolved after she started chemotherapy, she noticed mild left-sided pain in the past few days, -Constipation and management previously reviewed with patient -I have previously recommended  her to try TUMS  3. Nausea and anorexia and weight loss  -We previously reviewed her nausea management, she will continue Compazine and Zofran -I previously encouraged her to take a nutritional supplement -We previously discussed appetite stimulant, she is on Marinol, dose was increased to 5 mg twice daily last week, appetite improved, will continue. -she will f/u with dietician  -Previously encouraged her to have a high calorie, high protein diet.  4. Neutropenia  -Secondary to chemotherapy, she is on Neulasta after day 8 chemotherapy. -She is taking pre-natal vitamins that contain B-12 which is helpful -Continue monitoring  5. Goal of care discussion  -We previously discussed the incurable nature of her cancer, and the overall poor prognosis, especially if she does not have good response to chemotherapy or progress on chemo -The patient understands the goal of care is palliative. -I have previously recommended DNR/DNI, she will think about it.  6. Anorexia, weight loss -Loss of appetite after cycle 5.  -I encouraged her to eat many small meals throughout the day -If she can not eat, drink nutrient supplements.  -Much improved     Plan -Lab reviewed, adequate for treatment, we'll proceed cycle 7 day 1 cisplatin and gemcitabine today, day 8 next week  -CT scan 5/17 -f/u 5/29 -Prescription for her wig  -Due to her medication plan, we'll postpone next cycle chemotherapy for 1 week -Lab, flush and cycle 8 chemo cisplatin and gemcitabine on 5/29 and 6/5 and neulasta on 6/6    All questions were answered. The patient knows to call the clinic with any problems, questions or concerns.  I spent 20 minutes counseling the patient face to face. The total time  spent in the appointment was 25 minutes and more than 50% was on counseling.  This document serves as a record of services personally performed by Truitt Merle, MD. It was created on her behalf by Joslyn Devon, a trained medical  scribe. The creation of this record is based on the scribe's personal observations and the provider's statements to them. This document has been checked and approved by the attending provider. .  I have reviewed the above documentation for accuracy and completeness and I agree with the above.   Truitt Merle, MD 12/29/2016

## 2016-12-29 ENCOUNTER — Ambulatory Visit: Payer: Federal, State, Local not specified - PPO

## 2016-12-29 ENCOUNTER — Telehealth: Payer: Self-pay | Admitting: Hematology

## 2016-12-29 ENCOUNTER — Ambulatory Visit (HOSPITAL_BASED_OUTPATIENT_CLINIC_OR_DEPARTMENT_OTHER): Payer: Federal, State, Local not specified - PPO

## 2016-12-29 ENCOUNTER — Ambulatory Visit (HOSPITAL_BASED_OUTPATIENT_CLINIC_OR_DEPARTMENT_OTHER): Payer: Federal, State, Local not specified - PPO | Admitting: Hematology

## 2016-12-29 ENCOUNTER — Encounter: Payer: Self-pay | Admitting: *Deleted

## 2016-12-29 ENCOUNTER — Other Ambulatory Visit (HOSPITAL_BASED_OUTPATIENT_CLINIC_OR_DEPARTMENT_OTHER): Payer: Federal, State, Local not specified - PPO

## 2016-12-29 VITALS — BP 144/85 | HR 80 | Temp 98.6°F | Resp 17 | Ht 66.0 in | Wt 139.9 lb

## 2016-12-29 DIAGNOSIS — C7801 Secondary malignant neoplasm of right lung: Principal | ICD-10-CM

## 2016-12-29 DIAGNOSIS — R634 Abnormal weight loss: Secondary | ICD-10-CM

## 2016-12-29 DIAGNOSIS — D701 Agranulocytosis secondary to cancer chemotherapy: Secondary | ICD-10-CM

## 2016-12-29 DIAGNOSIS — J984 Other disorders of lung: Secondary | ICD-10-CM

## 2016-12-29 DIAGNOSIS — R110 Nausea: Secondary | ICD-10-CM | POA: Diagnosis not present

## 2016-12-29 DIAGNOSIS — R109 Unspecified abdominal pain: Secondary | ICD-10-CM | POA: Diagnosis not present

## 2016-12-29 DIAGNOSIS — C221 Intrahepatic bile duct carcinoma: Secondary | ICD-10-CM | POA: Diagnosis not present

## 2016-12-29 DIAGNOSIS — Z5111 Encounter for antineoplastic chemotherapy: Secondary | ICD-10-CM

## 2016-12-29 DIAGNOSIS — R630 Anorexia: Secondary | ICD-10-CM

## 2016-12-29 LAB — COMPREHENSIVE METABOLIC PANEL
ALT: 23 U/L (ref 0–55)
AST: 23 U/L (ref 5–34)
Albumin: 3.8 g/dL (ref 3.5–5.0)
Alkaline Phosphatase: 53 U/L (ref 40–150)
Anion Gap: 9 mEq/L (ref 3–11)
BUN: 13.2 mg/dL (ref 7.0–26.0)
CO2: 26 mEq/L (ref 22–29)
Calcium: 9 mg/dL (ref 8.4–10.4)
Chloride: 108 mEq/L (ref 98–109)
Creatinine: 0.7 mg/dL (ref 0.6–1.1)
EGFR: >90 mL/min/{1.73_m2} (ref >90)
Glucose: 93 mg/dl (ref 70–140)
Potassium: 4 mEq/L (ref 3.5–5.1)
Sodium: 143 mEq/L (ref 136–145)
Total Bilirubin: <0.22 mg/dL (ref 0.20–1.20)
Total Protein: 6.3 g/dL — ABNORMAL LOW (ref 6.4–8.3)

## 2016-12-29 LAB — CBC WITH DIFFERENTIAL/PLATELET
BASO%: 0.4 % (ref 0.0–2.0)
Basophils Absolute: 0 10*3/uL (ref 0.0–0.1)
EOS%: 1.3 % (ref 0.0–7.0)
Eosinophils Absolute: 0.1 10*3/uL (ref 0.0–0.5)
HCT: 30.5 % — ABNORMAL LOW (ref 34.8–46.6)
HGB: 9.9 g/dL — ABNORMAL LOW (ref 11.6–15.9)
LYMPH%: 32.7 % (ref 14.0–49.7)
MCH: 30.3 pg (ref 25.1–34.0)
MCHC: 32.5 g/dL (ref 31.5–36.0)
MCV: 93.3 fL (ref 79.5–101.0)
MONO#: 0.6 10*3/uL (ref 0.1–0.9)
MONO%: 11.9 % (ref 0.0–14.0)
NEUT#: 2.6 10*3/uL (ref 1.5–6.5)
NEUT%: 53.7 % (ref 38.4–76.8)
Platelets: 247 10*3/uL (ref 145–400)
RBC: 3.27 10*6/uL — ABNORMAL LOW (ref 3.70–5.45)
RDW: 15.4 % — ABNORMAL HIGH (ref 11.2–14.5)
WBC: 4.8 10*3/uL (ref 3.9–10.3)
lymph#: 1.6 10*3/uL (ref 0.9–3.3)
nRBC: 0 % (ref 0–0)

## 2016-12-29 LAB — MAGNESIUM: Magnesium: 2.2 mg/dl (ref 1.5–2.5)

## 2016-12-29 MED ORDER — HEPARIN SOD (PORK) LOCK FLUSH 100 UNIT/ML IV SOLN
500.0000 [IU] | Freq: Once | INTRAVENOUS | Status: AC | PRN
  Administered 2016-12-29: 500 [IU]
  Filled 2016-12-29: qty 5

## 2016-12-29 MED ORDER — SODIUM CHLORIDE 0.9 % IV SOLN
Freq: Once | INTRAVENOUS | Status: AC
  Administered 2016-12-29: 13:00:00 via INTRAVENOUS
  Filled 2016-12-29: qty 5

## 2016-12-29 MED ORDER — SODIUM CHLORIDE 0.9% FLUSH
10.0000 mL | INTRAVENOUS | Status: DC | PRN
  Administered 2016-12-29: 10 mL
  Filled 2016-12-29: qty 10

## 2016-12-29 MED ORDER — LEVOFLOXACIN 500 MG PO TABS: 500.0000 mg | ORAL_TABLET | Freq: Every day | ORAL | 0 refills | Status: DC

## 2016-12-29 MED ORDER — SODIUM CHLORIDE 0.9 % IV SOLN
25.0000 mg/m2 | Freq: Once | INTRAVENOUS | Status: AC
  Administered 2016-12-29: 43 mg via INTRAVENOUS
  Filled 2016-12-29: qty 43

## 2016-12-29 MED ORDER — SODIUM CHLORIDE 0.9 % IV SOLN
900.0000 mg/m2 | Freq: Once | INTRAVENOUS | Status: AC
  Administered 2016-12-29: 1558 mg via INTRAVENOUS
  Filled 2016-12-29: qty 40.98

## 2016-12-29 MED ORDER — PALONOSETRON HCL INJECTION 0.25 MG/5ML
INTRAVENOUS | Status: AC
  Filled 2016-12-29: qty 5

## 2016-12-29 MED ORDER — SODIUM CHLORIDE 0.9 % IV SOLN
Freq: Once | INTRAVENOUS | Status: AC
  Administered 2016-12-29: 11:00:00 via INTRAVENOUS

## 2016-12-29 MED ORDER — POTASSIUM CHLORIDE 2 MEQ/ML IV SOLN
Freq: Once | INTRAVENOUS | Status: AC
  Administered 2016-12-29: 11:00:00 via INTRAVENOUS
  Filled 2016-12-29: qty 10

## 2016-12-29 MED ORDER — PALONOSETRON HCL INJECTION 0.25 MG/5ML
0.2500 mg | Freq: Once | INTRAVENOUS | Status: AC
  Administered 2016-12-29: 0.25 mg via INTRAVENOUS

## 2016-12-29 NOTE — Patient Instructions (Signed)
Implanted Port Home Guide An implanted port is a type of central line that is placed under the skin. Central lines are used to provide IV access when treatment or nutrition needs to be given through a person's veins. Implanted ports are used for long-term IV access. An implanted port may be placed because:  You need IV medicine that would be irritating to the small veins in your hands or arms.  You need long-term IV medicines, such as antibiotics.  You need IV nutrition for a long period.  You need frequent blood draws for lab tests.  You need dialysis.  Implanted ports are usually placed in the chest area, but they can also be placed in the upper arm, the abdomen, or the leg. An implanted port has two main parts:  Reservoir. The reservoir is round and will appear as a small, raised area under your skin. The reservoir is the part where a needle is inserted to give medicines or draw blood.  Catheter. The catheter is a thin, flexible tube that extends from the reservoir. The catheter is placed into a large vein. Medicine that is inserted into the reservoir goes into the catheter and then into the vein.  How will I care for my incision site? Do not get the incision site wet. Bathe or shower as directed by your health care provider. How is my port accessed? Special steps must be taken to access the port:  Before the port is accessed, a numbing cream can be placed on the skin. This helps numb the skin over the port site.  Your health care provider uses a sterile technique to access the port. ? Your health care provider must put on a mask and sterile gloves. ? The skin over your port is cleaned carefully with an antiseptic and allowed to dry. ? The port is gently pinched between sterile gloves, and a needle is inserted into the port.  Only "non-coring" port needles should be used to access the port. Once the port is accessed, a blood return should be checked. This helps ensure that the port  is in the vein and is not clogged.  If your port needs to remain accessed for a constant infusion, a clear (transparent) bandage will be placed over the needle site. The bandage and needle will need to be changed every week, or as directed by your health care provider.  Keep the bandage covering the needle clean and dry. Do not get it wet. Follow your health care provider's instructions on how to take a shower or bath while the port is accessed.  If your port does not need to stay accessed, no bandage is needed over the port.  What is flushing? Flushing helps keep the port from getting clogged. Follow your health care provider's instructions on how and when to flush the port. Ports are usually flushed with saline solution or a medicine called heparin. The need for flushing will depend on how the port is used.  If the port is used for intermittent medicines or blood draws, the port will need to be flushed: ? After medicines have been given. ? After blood has been drawn. ? As part of routine maintenance.  If a constant infusion is running, the port may not need to be flushed.  How long will my port stay implanted? The port can stay in for as long as your health care provider thinks it is needed. When it is time for the port to come out, surgery will be   done to remove it. The procedure is similar to the one performed when the port was put in. When should I seek immediate medical care? When you have an implanted port, you should seek immediate medical care if:  You notice a bad smell coming from the incision site.  You have swelling, redness, or drainage at the incision site.  You have more swelling or pain at the port site or the surrounding area.  You have a fever that is not controlled with medicine.  This information is not intended to replace advice given to you by your health care provider. Make sure you discuss any questions you have with your health care provider. Document  Released: 08/16/2005 Document Revised: 01/22/2016 Document Reviewed: 04/23/2013 Elsevier Interactive Patient Education  2017 Else[employer] Inc.  

## 2016-12-29 NOTE — Patient Instructions (Signed)
Gemcitabine injection What is this medicine? GEMCITABINE (jem SIT a been) is a chemotherapy drug. This medicine is used to treat many types of cancer like breast cancer, lung cancer, pancreatic cancer, and ovarian cancer. This medicine may be used for other purposes; ask your health care provider or pharmacist if you have questions. COMMON BRAND NAME(S): Gemzar What should I tell my health care provider before I take this medicine? They need to know if you have any of these conditions: -blood disorders -infection -kidney disease -liver disease -recent or ongoing radiation therapy -an unusual or allergic reaction to gemcitabine, other chemotherapy, other medicines, foods, dyes, or preservatives -pregnant or trying to get pregnant -breast-feeding How should I use this medicine? This drug is given as an infusion into a vein. It is administered in a hospital or clinic by a specially trained health care professional. Talk to your pediatrician regarding the use of this medicine in children. Special care may be needed. Overdosage: If you think you have taken too much of this medicine contact a poison control center or emergency room at once. NOTE: This medicine is only for you. Do not share this medicine with others. What if I miss a dose? It is important not to miss your dose. Call your doctor or health care professional if you are unable to keep an appointment. What may interact with this medicine? -medicines to increase blood counts like filgrastim, pegfilgrastim, sargramostim -some other chemotherapy drugs like cisplatin -vaccines Talk to your doctor or health care professional before taking any of these medicines: -acetaminophen -aspirin -ibuprofen -ketoprofen -naproxen This list may not describe all possible interactions. Give your health care provider a list of all the medicines, herbs, non-prescription drugs, or dietary supplements you use. Also tell them if you smoke, drink alcohol,  or use illegal drugs. Some items may interact with your medicine. What should I watch for while using this medicine? Visit your doctor for checks on your progress. This drug may make you feel generally unwell. This is not uncommon, as chemotherapy can affect healthy cells as well as cancer cells. Report any side effects. Continue your course of treatment even though you feel ill unless your doctor tells you to stop. In some cases, you may be given additional medicines to help with side effects. Follow all directions for their use. Call your doctor or health care professional for advice if you get a fever, chills or sore throat, or other symptoms of a cold or flu. Do not treat yourself. This drug decreases your body's ability to fight infections. Try to avoid being around people who are sick. This medicine may increase your risk to bruise or bleed. Call your doctor or health care professional if you notice any unusual bleeding. Be careful brushing and flossing your teeth or using a toothpick because you may get an infection or bleed more easily. If you have any dental work done, tell your dentist you are receiving this medicine. Avoid taking products that contain aspirin, acetaminophen, ibuprofen, naproxen, or ketoprofen unless instructed by your doctor. These medicines may hide a fever. Women should inform their doctor if they wish to become pregnant or think they might be pregnant. There is a potential for serious side effects to an unborn child. Talk to your health care professional or pharmacist for more information. Do not breast-feed an infant while taking this medicine. What side effects may I notice from receiving this medicine? Side effects that you should report to your doctor or health care professional as   soon as possible: -allergic reactions like skin rash, itching or hives, swelling of the face, lips, or tongue -low blood counts - this medicine may decrease the number of white blood cells,  red blood cells and platelets. You may be at increased risk for infections and bleeding. -signs of infection - fever or chills, cough, sore throat, pain or difficulty passing urine -signs of decreased platelets or bleeding - bruising, pinpoint red spots on the skin, black, tarry stools, blood in the urine -signs of decreased red blood cells - unusually weak or tired, fainting spells, lightheadedness -breathing problems -chest pain -mouth sores -nausea and vomiting -pain, swelling, redness at site where injected -pain, tingling, numbness in the hands or feet -stomach pain -swelling of ankles, feet, hands -unusual bleeding Side effects that usually do not require medical attention (report to your doctor or health care professional if they continue or are bothersome): -constipation -diarrhea -hair loss -loss of appetite -stomach upset This list may not describe all possible side effects. Call your doctor for medical advice about side effects. You may report side effects to FDA at 1-800-FDA-1088. Where should I keep my medicine? This drug is given in a hospital or clinic and will not be stored at home. NOTE: This sheet is a summary. It may not cover all possible information. If you have questions about this medicine, talk to your doctor, pharmacist, or health care provider.  2018 Elsevier/Gold Standard (2007-12-26 18:45:54) Cisplatin injection What is this medicine? CISPLATIN (SIS pla tin) is a chemotherapy drug. It targets fast dividing cells, like cancer cells, and causes these cells to die. This medicine is used to treat many types of cancer like bladder, ovarian, and testicular cancers. This medicine may be used for other purposes; ask your health care provider or pharmacist if you have questions. COMMON BRAND NAME(S): Platinol, Platinol -AQ What should I tell my health care provider before I take this medicine? They need to know if you have any of these conditions: -blood  disorders -hearing problems -kidney disease -recent or ongoing radiation therapy -an unusual or allergic reaction to cisplatin, carboplatin, other chemotherapy, other medicines, foods, dyes, or preservatives -pregnant or trying to get pregnant -breast-feeding How should I use this medicine? This drug is given as an infusion into a vein. It is administered in a hospital or clinic by a specially trained health care professional. Talk to your pediatrician regarding the use of this medicine in children. Special care may be needed. Overdosage: If you think you have taken too much of this medicine contact a poison control center or emergency room at once. NOTE: This medicine is only for you. Do not share this medicine with others. What if I miss a dose? It is important not to miss a dose. Call your doctor or health care professional if you are unable to keep an appointment. What may interact with this medicine? -dofetilide -foscarnet -medicines for seizures -medicines to increase blood counts like filgrastim, pegfilgrastim, sargramostim -probenecid -pyridoxine used with altretamine -rituximab -some antibiotics like amikacin, gentamicin, neomycin, polymyxin B, streptomycin, tobramycin -sulfinpyrazone -vaccines -zalcitabine Talk to your doctor or health care professional before taking any of these medicines: -acetaminophen -aspirin -ibuprofen -ketoprofen -naproxen This list may not describe all possible interactions. Give your health care provider a list of all the medicines, herbs, non-prescription drugs, or dietary supplements you use. Also tell them if you smoke, drink alcohol, or use illegal drugs. Some items may interact with your medicine. What should I watch for while using this   medicine? Your condition will be monitored carefully while you are receiving this medicine. You will need important blood work done while you are taking this medicine. This drug may make you feel generally  unwell. This is not uncommon, as chemotherapy can affect healthy cells as well as cancer cells. Report any side effects. Continue your course of treatment even though you feel ill unless your doctor tells you to stop. In some cases, you may be given additional medicines to help with side effects. Follow all directions for their use. Call your doctor or health care professional for advice if you get a fever, chills or sore throat, or other symptoms of a cold or flu. Do not treat yourself. This drug decreases your body's ability to fight infections. Try to avoid being around people who are sick. This medicine may increase your risk to bruise or bleed. Call your doctor or health care professional if you notice any unusual bleeding. Be careful brushing and flossing your teeth or using a toothpick because you may get an infection or bleed more easily. If you have any dental work done, tell your dentist you are receiving this medicine. Avoid taking products that contain aspirin, acetaminophen, ibuprofen, naproxen, or ketoprofen unless instructed by your doctor. These medicines may hide a fever. Do not become pregnant while taking this medicine. Women should inform their doctor if they wish to become pregnant or think they might be pregnant. There is a potential for serious side effects to an unborn child. Talk to your health care professional or pharmacist for more information. Do not breast-feed an infant while taking this medicine. Drink fluids as directed while you are taking this medicine. This will help protect your kidneys. Call your doctor or health care professional if you get diarrhea. Do not treat yourself. What side effects may I notice from receiving this medicine? Side effects that you should report to your doctor or health care professional as soon as possible: -allergic reactions like skin rash, itching or hives, swelling of the face, lips, or tongue -signs of infection - fever or chills, cough,  sore throat, pain or difficulty passing urine -signs of decreased platelets or bleeding - bruising, pinpoint red spots on the skin, black, tarry stools, nosebleeds -signs of decreased red blood cells - unusually weak or tired, fainting spells, lightheadedness -breathing problems -changes in hearing -gout pain -low blood counts - This drug may decrease the number of white blood cells, red blood cells and platelets. You may be at increased risk for infections and bleeding. -nausea and vomiting -pain, swelling, redness or irritation at the injection site -pain, tingling, numbness in the hands or feet -problems with balance, movement -trouble passing urine or change in the amount of urine Side effects that usually do not require medical attention (report to your doctor or health care professional if they continue or are bothersome): -changes in vision -loss of appetite -metallic taste in the mouth or changes in taste This list may not describe all possible side effects. Call your doctor for medical advice about side effects. You may report side effects to FDA at 1-800-FDA-1088. Where should I keep my medicine? This drug is given in a hospital or clinic and will not be stored at home. NOTE: This sheet is a summary. It may not cover all possible information. If you have questions about this medicine, talk to your doctor, pharmacist, or health care provider.  2018 Elsevier/Gold Standard (2007-11-21 14:40:54)  

## 2016-12-29 NOTE — Telephone Encounter (Signed)
Appointments scheduled per 12/29/16 los. 2 bottle of contrast was given to patient with instructions. Patient was given a copy of the AVS report and appointment schedule per 12/29/16 los.

## 2016-12-30 ENCOUNTER — Encounter: Payer: Self-pay | Admitting: Hematology

## 2016-12-30 NOTE — Progress Notes (Signed)
[facility] Work  Clinical Social Work met with husband of pt for supportive counseling due to caregiving challenges, processing diagnosis and emotions related to diagnosis. Coping techniques and validation of emotions were discussed. CSW team to continue to follow and assist pt and family as appropriate.   Clinical Social Work interventions: Supportive counseling  Loren Racer, LCSW, OSW-C Clinical Social Worker [place]  [place] Phone: [phone] Fax: [phone]

## 2017-01-05 ENCOUNTER — Other Ambulatory Visit (HOSPITAL_BASED_OUTPATIENT_CLINIC_OR_DEPARTMENT_OTHER): Payer: Federal, State, Local not specified - PPO

## 2017-01-05 ENCOUNTER — Ambulatory Visit (HOSPITAL_BASED_OUTPATIENT_CLINIC_OR_DEPARTMENT_OTHER): Payer: Federal, State, Local not specified - PPO

## 2017-01-05 DIAGNOSIS — C7801 Secondary malignant neoplasm of right lung: Principal | ICD-10-CM

## 2017-01-05 DIAGNOSIS — C221 Intrahepatic bile duct carcinoma: Secondary | ICD-10-CM

## 2017-01-05 DIAGNOSIS — Z5111 Encounter for antineoplastic chemotherapy: Secondary | ICD-10-CM

## 2017-01-05 LAB — COMPREHENSIVE METABOLIC PANEL
ALT: 19 U/L (ref 0–55)
AST: 17 U/L (ref 5–34)
Albumin: 4.1 g/dL (ref 3.5–5.0)
Alkaline Phosphatase: 43 U/L (ref 40–150)
Anion Gap: 9 mEq/L (ref 3–11)
BUN: 11.3 mg/dL (ref 7.0–26.0)
CO2: 27 mEq/L (ref 22–29)
Calcium: 9.1 mg/dL (ref 8.4–10.4)
Chloride: 106 mEq/L (ref 98–109)
Creatinine: 0.7 mg/dL (ref 0.6–1.1)
EGFR: >90 mL/min/{1.73_m2} (ref >90)
Glucose: 88 mg/dl (ref 70–140)
Potassium: 3.7 mEq/L (ref 3.5–5.1)
Sodium: 142 mEq/L (ref 136–145)
Total Bilirubin: 0.26 mg/dL (ref 0.20–1.20)
Total Protein: 6.7 g/dL (ref 6.4–8.3)

## 2017-01-05 LAB — CBC WITH DIFFERENTIAL/PLATELET
BASO%: 0.6 % (ref 0.0–2.0)
Basophils Absolute: 0 10*3/uL (ref 0.0–0.1)
EOS%: 0.3 % (ref 0.0–7.0)
Eosinophils Absolute: 0 10*3/uL (ref 0.0–0.5)
HCT: 30.7 % — ABNORMAL LOW (ref 34.8–46.6)
HGB: 10.4 g/dL — ABNORMAL LOW (ref 11.6–15.9)
LYMPH%: 53.4 % — ABNORMAL HIGH (ref 14.0–49.7)
MCH: 31.1 pg (ref 25.1–34.0)
MCHC: 33.8 g/dL (ref 31.5–36.0)
MCV: 91.9 fL (ref 79.5–101.0)
MONO#: 0.1 10*3/uL (ref 0.1–0.9)
MONO%: 3.9 % (ref 0.0–14.0)
NEUT#: 1.4 10*3/uL — ABNORMAL LOW (ref 1.5–6.5)
NEUT%: 41.8 % (ref 38.4–76.8)
Platelets: 248 10*3/uL (ref 145–400)
RBC: 3.34 10*6/uL — ABNORMAL LOW (ref 3.70–5.45)
RDW: 15.1 % — ABNORMAL HIGH (ref 11.2–14.5)
WBC: 3.4 10*3/uL — ABNORMAL LOW (ref 3.9–10.3)
lymph#: 1.8 10*3/uL (ref 0.9–3.3)

## 2017-01-05 LAB — MAGNESIUM: Magnesium: 2 mg/dl (ref 1.5–2.5)

## 2017-01-05 MED ORDER — HEPARIN SOD (PORK) LOCK FLUSH 100 UNIT/ML IV SOLN
500.0000 [IU] | Freq: Once | INTRAVENOUS | Status: AC | PRN
  Administered 2017-01-05: 500 [IU]
  Filled 2017-01-05: qty 5

## 2017-01-05 MED ORDER — SODIUM CHLORIDE 0.9 % IV SOLN
Freq: Once | INTRAVENOUS | Status: AC
Start: 2017-01-05 — End: 2017-01-05
  Administered 2017-01-05: 14:00:00 via INTRAVENOUS

## 2017-01-05 MED ORDER — DEXTROSE-NACL 5-0.45 % IV SOLN
Freq: Once | INTRAVENOUS | Status: AC
  Administered 2017-01-05: 11:00:00 via INTRAVENOUS
  Filled 2017-01-05: qty 10

## 2017-01-05 MED ORDER — PALONOSETRON HCL INJECTION 0.25 MG/5ML
INTRAVENOUS | Status: AC
  Filled 2017-01-05: qty 5

## 2017-01-05 MED ORDER — PALONOSETRON HCL INJECTION 0.25 MG/5ML
0.2500 mg | Freq: Once | INTRAVENOUS | Status: AC
  Administered 2017-01-05: 0.25 mg via INTRAVENOUS

## 2017-01-05 MED ORDER — SODIUM CHLORIDE 0.9 % IV SOLN
25.0000 mg/m2 | Freq: Once | INTRAVENOUS | Status: AC
  Administered 2017-01-05: 43 mg via INTRAVENOUS
  Filled 2017-01-05: qty 43

## 2017-01-05 MED ORDER — SODIUM CHLORIDE 0.9 % IV SOLN
Freq: Once | INTRAVENOUS | Status: AC
  Administered 2017-01-05: 13:00:00 via INTRAVENOUS
  Filled 2017-01-05: qty 5

## 2017-01-05 MED ORDER — SODIUM CHLORIDE 0.9% FLUSH
10.0000 mL | INTRAVENOUS | Status: DC | PRN
  Administered 2017-01-05: 10 mL
  Filled 2017-01-05: qty 10

## 2017-01-05 MED ORDER — SODIUM CHLORIDE 0.9 % IV SOLN
900.0000 mg/m2 | Freq: Once | INTRAVENOUS | Status: AC
  Administered 2017-01-05: 1558 mg via INTRAVENOUS
  Filled 2017-01-05: qty 40.98

## 2017-01-05 NOTE — Patient Instructions (Signed)
[facility] Discharge Instructions for Patients Receiving Chemotherapy  Today you received the following chemotherapy agents:  Gemzar and Cisplatin.  To help prevent nausea and vomiting after your treatment, we encourage you to take your nausea medication as directed.   If you develop nausea and vomiting that is not controlled by your nausea medication, call the clinic.   BELOW ARE SYMPTOMS THAT SHOULD BE REPORTED IMMEDIATELY:  *FEVER GREATER THAN 100.5 F  *CHILLS WITH OR WITHOUT FEVER  NAUSEA AND VOMITING THAT IS NOT CONTROLLED WITH YOUR NAUSEA MEDICATION  *UNUSUAL SHORTNESS OF BREATH  *UNUSUAL BRUISING OR BLEEDING  TENDERNESS IN MOUTH AND THROAT WITH OR WITHOUT PRESENCE OF ULCERS  *URINARY PROBLEMS  *BOWEL PROBLEMS  UNUSUAL RASH Items with * indicate a potential emergency and should be followed up as soon as possible.  Feel free to call the clinic you have any questions or concerns. The clinic phone number is [phone].  Please show the [place] at check-in to the Emergency Department and triage nurse.

## 2017-01-06 ENCOUNTER — Ambulatory Visit (HOSPITAL_BASED_OUTPATIENT_CLINIC_OR_DEPARTMENT_OTHER): Payer: Federal, State, Local not specified - PPO

## 2017-01-06 DIAGNOSIS — Z5189 Encounter for other specified aftercare: Secondary | ICD-10-CM | POA: Diagnosis not present

## 2017-01-06 DIAGNOSIS — C221 Intrahepatic bile duct carcinoma: Secondary | ICD-10-CM

## 2017-01-06 DIAGNOSIS — C7801 Secondary malignant neoplasm of right lung: Principal | ICD-10-CM

## 2017-01-06 LAB — CANCER ANTIGEN 19-9: CA 19-9: 35 U/mL (ref 0–35)

## 2017-01-06 MED ORDER — PEGFILGRASTIM INJECTION 6 MG/0.6ML ~~LOC~~
6.0000 mg | PREFILLED_SYRINGE | Freq: Once | SUBCUTANEOUS | Status: AC
  Administered 2017-01-06: 6 mg via SUBCUTANEOUS
  Filled 2017-01-06: qty 0.6

## 2017-01-06 NOTE — Patient Instructions (Signed)
Pegfilgrastim injection What is this medicine? PEGFILGRASTIM (PEG fil gra stim) is a long-acting granulocyte colony-stimulating factor that stimulates the growth of neutrophils, a type of white blood cell important in the body's fight against infection. It is used to reduce the incidence of fever and infection in patients with certain types of cancer who are receiving chemotherapy that affects the bone marrow, and to increase survival after being exposed to high doses of radiation. This medicine may be used for other purposes; ask your health care provider or pharmacist if you have questions. COMMON BRAND NAME(S): Neulasta What should I tell my health care provider before I take this medicine? They need to know if you have any of these conditions: -kidney disease -latex allergy -ongoing radiation therapy -sickle cell disease -skin reactions to acrylic adhesives (On-Body Injector only) -an unusual or allergic reaction to pegfilgrastim, filgrastim, other medicines, foods, dyes, or preservatives -pregnant or trying to get pregnant -breast-feeding How should I use this medicine? This medicine is for injection under the skin. If you get this medicine at home, you will be taught how to prepare and give the pre-filled syringe or how to use the On-body Injector. Refer to the patient Instructions for Use for detailed instructions. Use exactly as directed. Tell your healthcare provider immediately if you suspect that the On-body Injector may not have performed as intended or if you suspect the use of the On-body Injector resulted in a missed or partial dose. It is important that you put your used needles and syringes in a special sharps container. Do not put them in a trash can. If you do not have a sharps container, call your pharmacist or healthcare provider to get one. Talk to your pediatrician regarding the use of this medicine in children. While this drug may be prescribed for selected conditions,  precautions do apply. Overdosage: If you think you have taken too much of this medicine contact a poison control center or emergency room at once. NOTE: This medicine is only for you. Do not share this medicine with others. What if I miss a dose? It is important not to miss your dose. Call your doctor or health care professional if you miss your dose. If you miss a dose due to an On-body Injector failure or leakage, a new dose should be administered as soon as possible using a single prefilled syringe for manual use. What may interact with this medicine? Interactions have not been studied. Give your health care provider a list of all the medicines, herbs, non-prescription drugs, or dietary supplements you use. Also tell them if you smoke, drink alcohol, or use illegal drugs. Some items may interact with your medicine. This list may not describe all possible interactions. Give your health care provider a list of all the medicines, herbs, non-prescription drugs, or dietary supplements you use. Also tell them if you smoke, drink alcohol, or use illegal drugs. Some items may interact with your medicine. What should I watch for while using this medicine? You may need blood work done while you are taking this medicine. If you are going to need a MRI, CT scan, or other procedure, tell your doctor that you are using this medicine (On-Body Injector only). What side effects may I notice from receiving this medicine? Side effects that you should report to your doctor or health care professional as soon as possible: -allergic reactions like skin rash, itching or hives, swelling of the face, lips, or tongue -dizziness -fever -pain, redness, or irritation at site   where injected -pinpoint red spots on the skin -red or dark-brown urine -shortness of breath or breathing problems -stomach or side pain, or pain at the shoulder -swelling -tiredness -trouble passing urine or change in the amount of urine Side  effects that usually do not require medical attention (report to your doctor or health care professional if they continue or are bothersome): -bone pain -muscle pain This list may not describe all possible side effects. Call your doctor for medical advice about side effects. You may report side effects to FDA at 1-800-FDA-1088. Where should I keep my medicine? Keep out of the reach of children. Store pre-filled syringes in a refrigerator between 2 and 8 degrees C (36 and 46 degrees F). Do not freeze. Keep in carton to protect from light. Throw away this medicine if it is left out of the refrigerator for more than 48 hours. Throw away any unused medicine after the expiration date. NOTE: This sheet is a summary. It may not cover all possible information. If you have questions about this medicine, talk to your doctor, pharmacist, or health care provider.  2018 Elsevier/Gold Standard (2016-08-12 12:58:03)  

## 2017-01-10 ENCOUNTER — Telehealth: Payer: Self-pay | Admitting: *Deleted

## 2017-01-10 NOTE — Telephone Encounter (Signed)
Pt called requesting an appt for office visit with Dr. Burr Medico soon.  Stated she has opportunity to be able to work from home.  Pt wanted to meet with Dr. Burr Medico to discuss pt's situation, and whether or not MD thinks pt can safely work from home. Pt's    Phone     [phone].

## 2017-01-12 ENCOUNTER — Telehealth: Payer: Self-pay | Admitting: *Deleted

## 2017-01-12 NOTE — Telephone Encounter (Signed)
Received vm message from pt asking to discuss with Dr Burr Medico working from home.  She works for the [employer] & is looking at working a minimum of 4 hours/ day since change in chemo dose & the week she has Neulasta, she has to be out for a week from work.  Discussed with Dr Burr Medico & informed Nancy [name] to talk with her employer & if there is form to fill out to send it or let us know if she needs a letter.  She will be in contact with them & get back with [place].

## 2017-01-20 NOTE — Progress Notes (Signed)
[place]  [phone] [phone]  Clinic Follow Up Note   Patient Care Team: Carollee Herter, Alferd Apa, DO as PCP - General Pyrtle, Lajuan Lines, MD as Consulting Physician (Gastroenterology) 01/26/2017  CHIEF COMPLAINTS:  Follow up metastatic intrahepatic cholangiocarcinoma  Oncology History   Metastatic cholangiocarcinoma to lung [facility])   Staging form: Perihilar Bile Ducts, AJCC 7th Edition   - Clinical stage from 07/07/2016: Stage IVB (T2b, N1, M1) - Signed by Truitt Merle, MD on 07/28/2016      Metastatic cholangiocarcinoma to lung ([place])   05/22/2014 Procedure    Routine screening colonoscopy showed a sessile polyp measuring 4 mm in the sigmoid colon, removed, otherwise negative.      06/29/2016 Imaging    CT chest, abdomen and pelvis showed 2 indeterminant nodule in left and the right lung, 4-25mm, indeterminate a hypovascular liver lesions largest in the caudate lobe measuring 2.6 cm, mild abdominal lymphadenopathy in the gastrohepatic ligament and portocaval space.      07/02/2016 Imaging    Abdominal MRI with and without contrast showed 2 lobular lesions in the caudate lobe, and is regular lymph nodes in the gastrohepatic ligament which is adjacent to the liver lesion, suspicious for malignancy. 2 small lesions in the right hepatic lobe are indeterminate, but concerning for metastasis.      07/07/2016 Procedure    EGD was negative, EUS biopsy of the liver and adjacent lymph node      07/07/2016 Initial Diagnosis    Metastatic cholangiocarcinoma to lung ([place])      07/07/2016 Initial Biopsy    Fine-needle aspiration of the liver lesion in caudate lobe and adjacent lymph nodes both showed metastatic adenocarcinoma.      08/11/2016 -  Chemotherapy    Cisplatin 25 mg/m, gemcitabine 1000 mg/m, on day one and 8, every 21 days        10/18/2016 Imaging    CT CAP  IMPRESSION: 1. Again noted are multifocal liver metastasis. Overall there has been  no significant interval change in overall volume of liver metastases. 2. Similar appearance of upper abdominal adenopathy 3. Slight decrease in size of small pulmonary metastases.      01/25/2017 Imaging    CT CAP W Contrast 01/25/17 IMPRESSION: 1. Response to therapy. 2. Similar to minimal decrease in pulmonary nodules/metastasis. 3. Improvement in hepatic disease burden. 4. Decrease in abdominal adenopathy. 5. Significantly age advanced atherosclerosis, including within the coronary arteries. 6. Uterine fibroids. 7. Suspect a complex cyst in the left ovary. Recommend attention on follow-up.        HISTORY OF PRESENTING ILLNESS: 07/28/17 Nancy [name] 51 y.o. female is here because of Her recently diagnosed metastatic glandular carcinoma. She is accompanied by her husband and mother to my clinic today.  She has been having RUQ abdominal pain since mid 04/2016, she was seen by PCP and was treated for gas with GI cocktail and pepcide, which she did not help much. Her pain got worse, and radiates to right shoulder, she denies significant nausea, or bloating, BM normal, no fever, cough or dyspnea. She recently noticed mild chest discomfort in the low sternum area. She initially had the lab, ultrasound done by her primary care physician, which was unrevealing. She was referred to GI Dr. Hilarie Fredrickson and underwent EGD and CT scan, which showed a lobulated mass in the caudate lobe of liver, with at adjacent adenopathy. He underwent EUS and fine-needle biopsy of the liver mass and lymph nodes, all reviewed adenocarcinoma.  She has lost about 7lbs in the past 3 months. She has mild fatigue, but able to function at home. She has stopped working due to her abdominal pain and fatigue. She takes Norco every 1-3 times a day, and her pain seems not well controlled.  CURRENT THERAPY: Cisplatin and gemcitabine on Day 1, 8 every 21 days, with neulasta on day 9, Gemcitabine dosed reduced to 800 mg/m due to  cytopenias, increased to 900mg /m2 on 12/06/2016.  INTERIM HISTORY:  Nancy [name] returns for follow up and cycle 8 of treatment. She presents today with her friend and mother. She returned from her cruise and used the antibiotics for her throat and coughing. For 2 days she needed to rest from the people but she enjoyed herself in the [place]. She has been approved at work to work from home so she will request a signature for clearance of 4 hours a day except treatment week. She rougher days are after Neulasta injection but overall she is tolerating her chemo treatment well.    MEDICAL HISTORY:  Past Medical History:  Diagnosis Date  . Allergy    SEASONAL  . Anemia    HIGH SCHOOL  . Atopic eczema   . cholangio ca dx'd 07/12/16  . Fibrocystic breast disease   . GERD (gastroesophageal reflux disease)   . Hx of migraines    seasonal   . Pneumonia    6 years ago   . TIA (transient ischemic attack)     SURGICAL HISTORY: Past Surgical History:  Procedure Laterality Date  . EUS N/A 07/07/2016   Procedure: UPPER ENDOSCOPIC ULTRASOUND (EUS) RADIAL;  Surgeon: [name], MD;  Location: [facility] ENDOSCOPY;  Service: Endoscopy;  Laterality: N/A;  . IR GENERIC HISTORICAL  08/03/2016   IR US GUIDE VASC ACCESS RIGHT 08/03/2016 Corrie Mckusick, DO [facility]-INTER[facility] RAD  . IR GENERIC HISTORICAL  08/03/2016   IR FLUORO GUIDE PORT INSERTION RIGHT 08/03/2016 Corrie Mckusick, DO [facility]  . MYOMECTOMY  2002    SOCIAL HISTORY: Social History   Social History  . Marital status: Married    Spouse name: N/A  . Number of children: 0  . Years of education: 42   Occupational History  . [employer]  .  [employer]   Social History Main Topics  . Smoking status: Never Smoker  . Smokeless tobacco: Never Used  . Alcohol use Yes     Comment: maybe 3 times a year  . Drug use: No  . Sexual activity: Yes    Partners: Male   Other Topics Concern  . Not on file   Social History Narrative  . No narrative on file      FAMILY HISTORY: Family History  Problem Relation Age of Onset  . Gout Mother   . Diabetes Mother   . Hypertension Father   . Diabetes Father   . Breast cancer Paternal Aunt   . Cancer Maternal Grandfather        throat cancer   . Cancer Cousin 86       GI cancer   . Thyroid disease Neg Hx     ALLERGIES:  is allergic to penicillins and lipitor [atorvastatin].  MEDICATIONS:  Current Outpatient Prescriptions  Medication Sig Dispense Refill  . ALPRAZolam (XANAX) 0.5 MG tablet Take 1 tablet (0.5 mg total) by mouth as needed for anxiety. 10 tablet 0  . aspirin 81 MG tablet Take 81 mg by mouth daily.    . cetirizine (ZYRTEC) 10  MG tablet Take 10 mg by mouth daily as needed. For allergies     . Cholecalciferol (VITAMIN D3) 5000 UNITS CAPS Take 1 capsule by mouth daily.    . COLOSTRUM PO Take 1.25 g by mouth 2 (two) times daily. 1.25 grams twice daily    . dicyclomine (BENTYL) 20 MG tablet 3 (three) times daily as needed.     . dronabinol (MARINOL) 5 MG capsule Take 1 capsule (5 mg total) by mouth 2 (two) times daily before a meal. 60 capsule 1  . HYDROcodone-acetaminophen (NORCO) 5-325 MG tablet Take 1-2 tablets by mouth every 6 (six) hours as needed for severe pain. 60 tablet 0  . lidocaine-prilocaine (EMLA) cream Apply 1 application topically as needed. Apply to portacath 1 1/2 hours - 2 hours prior to procedures as needed. 30 g 1  . meclizine (ANTIVERT) 50 MG tablet Take 1 tablet (50 mg total) by mouth 3 (three) times daily as needed. 30 tablet 0  . Menaquinone-7 (VITAMIN K2) 40 MCG TABS Take by mouth.    . mometasone (ELOCON) 0.1 % lotion Apply topically daily. 60 mL 0  . omeprazole (PRILOSEC) 40 MG capsule Take 1 capsule (40 mg total) by mouth 2 (two) times daily before a meal. 60 capsule 11  . ondansetron (ZOFRAN) 8 MG tablet Take 1 tablet (8 mg total) by mouth every 8 (eight) hours as needed for nausea or vomiting. 20 tablet 1  . Prenatal Vit-Fe Fumarate-FA (PRENATAL  MULTIVITAMIN) TABS tablet Take 2 tablets by mouth daily at 12 noon.    . triamcinolone cream (KENALOG) 0.1 % Apply topically.    Marland Kitchen VITAMIN K PO Take 1 capsule by mouth daily.     No current facility-administered medications for this visit.    Facility-Administered Medications Ordered in Other Visits  Medication Dose Route Frequency Provider Last Rate Last Dose  . 0.9 %  sodium chloride infusion   Intravenous Once Truitt Merle, MD      . alteplase (CATHFLO ACTIVASE) injection 2 mg  2 mg Intracatheter Once PRN Truitt Merle, MD      . CISplatin (PLATINOL) 43 mg in sodium chloride 0.9 % 250 mL chemo infusion  25 mg/m2 (Treatment Plan Recorded) Intravenous Once Truitt Merle, MD      . dextrose 5 % and 0.45% NaCl 1,000 mL with potassium chloride 20 mEq, magnesium sulfate 12 mEq infusion   Intravenous Once Truitt Merle, MD      . fosaprepitant (EMEND) 150 mg, dexamethasone (DECADRON) 12 mg in sodium chloride 0.9 % 145 mL IVPB   Intravenous Once Truitt Merle, MD      . gemcitabine (GEMZAR) 1,558 mg in sodium chloride 0.9 % 100 mL chemo infusion  900 mg/m2 (Treatment Plan Recorded) Intravenous Once Truitt Merle, MD      . heparin lock flush 100 unit/mL  250 Units Intracatheter Once PRN Truitt Merle, MD      . heparin lock flush 100 unit/mL  500 Units Intracatheter Once PRN Truitt Merle, MD      . palonosetron (ALOXI) injection 0.25 mg  0.25 mg Intravenous Once Truitt Merle, MD      . sodium chloride flush (NS) 0.9 % injection 10 mL  10 mL Intracatheter PRN Truitt Merle, MD   10 mL at 09/09/16 1645  . sodium chloride flush (NS) 0.9 % injection 10 mL  10 mL Intracatheter PRN Truitt Merle, MD        REVIEW OF SYSTEMS: Constitutional: Denies fevers, chills or abnormal night  sweats (+) hair thinning  Eyes: Denies blurriness of vision, double vision or watery eyes Ears, nose, mouth, throat, and face: Denies mucositis or sore throat Respiratory: Denies cough, dyspnea or wheezes (+) dry cough Cardiovascular: Denies palpitation, chest  discomfort or lower extremity swelling Gastrointestinal:  Denies heartburn or change in bowel habits (+) abdominal discomfort Skin: Denies abnormal skin rashes (+) browning on hands (+) bump on groin Lymphatics: Denies new lymphadenopathy or easy bruising Neurological:Denies numbness, tingling or new weaknesses,  Behavioral/Psych: Mood is stable, no new changes  Musculoskeletal: (+) pain in shoulder and superior spine due to infusion medication All other systems were reviewed with the patient and are negative.  PHYSICAL EXAMINATION: ECOG PERFORMANCE STATUS: 1 - Symptomatic but completely ambulatory   Vitals:   01/26/17 0830  BP: (!) 142/74  Pulse: 71  Resp: 18  Temp: 98.8 F (37.1 C)  TempSrc: Oral  SpO2: 100%  Weight: 139 lb 1.6 oz (63.1 kg)  Height: 5\' 6"  (1.676 m)     GENERAL:alert, no distress and comfortable SKIN: skin color, texture, turgor are normal, no rashes or significant lesions, several hematomas on extremities  EYES: normal, conjunctiva are pink and non-injected, sclera clear OROPHARYNX:no exudate, no erythema and lips, buccal mucosa NECK: supple, thyroid normal size, non-tender, without nodularity LYMPH:  no palpable lymphadenopathy in the cervical, axillary or inguinal LUNGS: clear to auscultation and percussion with normal breathing effort HEART: regular rate & rhythm and no murmurs and no lower extremity edema ABDOMEN:abdomen soft, non-tender and normal bowel sounds Musculoskeletal:no cyanosis of digits and no clubbing  PSYCH: alert & oriented x 3 with fluent speech NEURO: no focal motor/sensory deficits  LABORATORY DATA:  I have reviewed the data as listed CBC Latest Ref Rng & Units 01/26/2017 01/05/2017 12/29/2016  WBC 3.9 - 10.3 10e3/uL 3.2(L) 3.4(L) 4.8  Hemoglobin 11.6 - 15.9 g/dL 10.7(L) 10.4(L) 9.9(L)  Hematocrit 34.8 - 46.6 % 32.1(L) 30.7(L) 30.5(L)  Platelets 145 - 400 10e3/uL 320 248 247   CMP Latest Ref Rng & Units 01/05/2017 12/29/2016 12/13/2016    Glucose 70 - 140 mg/dl 88 93 93  BUN 7.0 - 26.0 mg/dL 11.3 13.2 9.3  Creatinine 0.6 - 1.1 mg/dL 0.7 0.7 0.6  Sodium 136 - 145 mEq/L 142 143 140  Potassium 3.5 - 5.1 mEq/L 3.7 4.0 4.1  Chloride 101 - 111 mmol/L - - -  CO2 22 - 29 mEq/L 27 26 25   Calcium 8.4 - 10.4 mg/dL 9.1 9.0 9.1  Total Protein 6.4 - 8.3 g/dL 6.7 6.3(L) 6.5  Total Bilirubin 0.20 - 1.20 mg/dL 0.26 <0.22 <0.22  Alkaline Phos 40 - 150 U/L 43 53 35(L)  AST 5 - 34 U/L 17 23 16   ALT 0 - 55 U/L 19 23 17    CA19.9 (0-35U/ml) 07/28/2016: 36 08/18/2016: 51 09/13/2016: 56 10/19/2016: 43 11/09/2016: 44 12/06/2016: 41 01/05/2017: 35   PATHOLOGY REPORT  Diagnosis 07/07/2016 FINE NEEDLE ASPIRATION, ENDOSCOPIC, LIVER (SPECIMEN 2 OF 2 COLLECTED 07/07/16): MALIGNANT CELLS CONSISTENT WITH ADENOCARCINOMA.  ADDITIONAL INFORMATION: Per request, immunohistochemistry was performed. The cells are positive for cytokeratin 7 and negative for cytokeratin 20 and CDX-2. This excludes a colorectal primary. Pancreaticobiliary and upper GI sources remain in the differential. Called to Dr. Burr Medico on 07/28/2016.  Diagnosis 07/07/2016 FINE NEEDLE ASPIRATION, ENDOSCOPIC, GASTRO-HEPATIC LYMPH NODE (SPECIMEN 1 OF 2 COLLECTED 07/07/16): MALIGNANT CELLS CONSISTENT WITH ADENOCARCINOMA, SEE COMMENT. Preliminary Diagnosis Intraoperative Diagnosis: Few atypical glandular clusters - recommend additional material. 4) Adequate ([facility])  RADIOGRAPHIC STUDIES: I have personally  reviewed the radiological images as listed and agreed with the findings in the report.  CT CAP W Contrast 01/25/17 IMPRESSION: 1. Response to therapy. 2. Similar to minimal decrease in pulmonary nodules/metastasis. 3. Improvement in hepatic disease burden. 4. Decrease in abdominal adenopathy. 5. Significantly age advanced atherosclerosis, including within the coronary arteries. 6. Uterine fibroids. 7. Suspect a complex cyst in the left ovary. Recommend attention on follow-up.  CT CAP W  CONTRAST 10/18/16 IMPRESSION: 1. Again noted are multifocal liver metastasis. Overall there has been no significant interval change in overall volume of liver metastases. 2. Similar appearance of upper abdominal adenopathy 3. Slight decrease in size of small pulmonary metastases.  Upper EUS 07/07/2016 Dr. Ardis Hughs  - Endoscopically normal UGI tract (good views with standard gastroscope) - Vaguely bordered 4cm mass in the caudate lobe of liver that is confluent with what appear to be enlarged gastrohepatic lymphnodes (these may represent direct tumor extension however). Preliminary cytology from the lymphnodes shows malignancy, glandular. Preliminary cytology from the liver mass shows the same. Await final cytology results but this is most suspicious for peripheral intrahepatic cholangiocarcinoma with adjacent malignant adenopathy.  COLONOSCOPY 05/22/2014 ENDOSCOPIC IMPRESSION: 1. Sessile polyp measuring 4 mm in size was found in the sigmoid colon; polypectomy was performed with a cold snare 2. The colon mucosa was otherwise normal  ASSESSMENT & PLAN:  51 y.o. African-American female, without significant past medical history, presented with abdominal pain  1. Metastatic intrahepatic cholangiocarcinoma, with probable node and lung metastasis, [id number], stage IV -I previously reviewed her CT, MRI, endoscopy findings and her biopsy results with patient and her family members in details. -We have previously reviewed her case in our GI tumor board. Based on the scans and endoscopy findings, this is most consistent with cholangiocarcinoma. Her liver and inguinal biopsy showed adenocarcinoma, IHC was consistent with pancreatic-biliary primary -She has 2 additional small liver lesions and 2 4-89mm lung lesions, which are indeterminate but that is suspicious for metastatic disease. -She was seen by surgeon Dr. Barry Dienes who thinks she is a not a candidate for surgical resection due to her metastatic  disease. -We previously discussed her cancer is incurable at this stage, the goal of therapy is palliation, to prolong life and improve her quality life -she is on first-line cisplatin and gemcitabine, on day 1, 8 every 21 days.  -She has been tolerating chemotherapy moderately well, but had significant neutropenia and thrombocytopenia, cycle 2 chemotherapy was dose reduced -I previously reviewed her restaging CT scan from 10/19/2016, which showed stable disease overall, no other new lesions. She is clinically doing well, her abdominal pain has much improved. -CT from 5/29 reviewed with pt and it shows excellent partical response to treatment, both liver lesions and adenopathy have decreased in size, small lung nodules are stable, no new lesions.  -We'll continue chemotherapy with cisplatin and gemcitabine at current dose, with neulasta on day 9  -Lab from today reviewed with patient, adequate for treatment, we'll proceed to cycle 8 today  2. Abdominal pain, improving -continue hydrocodone/acetaminophen every 4-6 hours as needed for pain control  -Her pain has nearly resolved after she started chemotherapy, she noticed mild left-sided pain in the past few days, -Constipation and management previously reviewed with patient -I have previously recommended her to try TUMS  3. Nausea and anorexia and weight loss  -We previously reviewed her nausea management, she will continue Compazine and Zofran -I previously encouraged her to take a nutritional supplement -We previously discussed appetite stimulant, she is  on Marinol, dose was increased to 5 mg twice daily last week, appetite improved, will continue. -she will f/u with dietician  -Previously encouraged her to have a high calorie, high protein diet.  4. Neutropenia  -Secondary to chemotherapy, much improved with neulasta. -She is taking pre-natal vitamins that contain B-12 which is helpful -Continue monitoring  5. Goal of care discussion    -We previously discussed the incurable nature of her cancer, and the overall poor prognosis, especially if she does not have good response to chemotherapy or progress on chemo -The patient understands the goal of care is palliative. -I have previously recommended DNR/DNI, she will think about it.   Plan -CT scan reviewed, excellent PR, continue chemo  -Lab reviewed, adequate for treatment, we'll proceed cycle 8 day 1 cisplatin and gemcitabine today, day 8 next week  -f/u 6/18 -Lab, flush and cycle 9 chemo cisplatin and gemcitabine on 6/18 and 6/25   All questions were answered. The patient knows to call the clinic with any problems, questions or concerns.  I spent 20 minutes counseling the patient face to face. The total time spent in the appointment was 25 minutes and more than 50% was on counseling.  This document serves as a record of services personally performed by Truitt Merle, MD. It was created on her behalf by Joslyn Devon, a trained medical scribe. The creation of this record is based on the scribe's personal observations and the provider's statements to them. This document has been checked and approved by the attending provider. .  I have reviewed the above documentation for accuracy and completeness and I agree with the above.   Truitt Merle, MD 01/26/2017

## 2017-01-25 ENCOUNTER — Ambulatory Visit (HOSPITAL_COMMUNITY)
Admission: RE | Admit: 2017-01-25 | Discharge: 2017-01-25 | Disposition: A | Discharge status: Home / Self Care | Payer: Federal, State, Local not specified - PPO | Source: Ambulatory Visit | Attending: Hematology | Admitting: Hematology

## 2017-01-25 ENCOUNTER — Encounter (HOSPITAL_COMMUNITY): Payer: Self-pay

## 2017-01-25 DIAGNOSIS — D259 Leiomyoma of uterus, unspecified: Secondary | ICD-10-CM | POA: Insufficient documentation

## 2017-01-25 DIAGNOSIS — C221 Intrahepatic bile duct carcinoma: Secondary | ICD-10-CM | POA: Diagnosis present

## 2017-01-25 DIAGNOSIS — I2510 Atherosclerotic heart disease of native coronary artery without angina pectoris: Secondary | ICD-10-CM | POA: Insufficient documentation

## 2017-01-25 DIAGNOSIS — R911 Solitary pulmonary nodule: Secondary | ICD-10-CM | POA: Insufficient documentation

## 2017-01-25 DIAGNOSIS — C7801 Secondary malignant neoplasm of right lung: Secondary | ICD-10-CM | POA: Diagnosis present

## 2017-01-25 DIAGNOSIS — R599 Enlarged lymph nodes, unspecified: Secondary | ICD-10-CM | POA: Insufficient documentation

## 2017-01-25 HISTORY — DX: Malignant (primary) neoplasm, unspecified: C80.1

## 2017-01-25 MED ORDER — IOPAMIDOL (ISOVUE-300) INJECTION 61%
INTRAVENOUS | Status: AC
  Filled 2017-01-25: qty 100

## 2017-01-25 MED ORDER — HEPARIN SOD (PORK) LOCK FLUSH 100 UNIT/ML IV SOLN
INTRAVENOUS | Status: AC
  Administered 2017-01-25: 500 [IU] via INTRAVENOUS
  Filled 2017-01-25: qty 5

## 2017-01-25 MED ORDER — IOPAMIDOL (ISOVUE-300) INJECTION 61%
100.0000 mL | Freq: Once | INTRAVENOUS | Status: AC | PRN
  Administered 2017-01-25: 100 mL via INTRAVENOUS

## 2017-01-25 MED ORDER — HEPARIN SOD (PORK) LOCK FLUSH 100 UNIT/ML IV SOLN
500.0000 [IU] | Freq: Once | INTRAVENOUS | Status: AC
  Administered 2017-01-25: 500 [IU] via INTRAVENOUS

## 2017-01-25 NOTE — Progress Notes (Signed)
Nancy [name] requested we leave her [facility] accessed for chemo in the am. The office was not open to verify this request. Per our previous interaction with [name] and leaving her accessed after previous CT scans, we have honored her request.

## 2017-01-26 ENCOUNTER — Telehealth: Payer: Self-pay | Admitting: Hematology

## 2017-01-26 ENCOUNTER — Ambulatory Visit (HOSPITAL_BASED_OUTPATIENT_CLINIC_OR_DEPARTMENT_OTHER): Payer: Federal, State, Local not specified - PPO

## 2017-01-26 ENCOUNTER — Ambulatory Visit (HOSPITAL_BASED_OUTPATIENT_CLINIC_OR_DEPARTMENT_OTHER): Payer: Federal, State, Local not specified - PPO | Admitting: Hematology

## 2017-01-26 ENCOUNTER — Other Ambulatory Visit (HOSPITAL_BASED_OUTPATIENT_CLINIC_OR_DEPARTMENT_OTHER): Payer: Federal, State, Local not specified - PPO

## 2017-01-26 ENCOUNTER — Encounter: Payer: Self-pay | Admitting: Hematology

## 2017-01-26 ENCOUNTER — Ambulatory Visit: Payer: Federal, State, Local not specified - PPO

## 2017-01-26 VITALS — BP 142/74 | HR 71 | Temp 98.8°F | Resp 18 | Ht 66.0 in | Wt 139.1 lb

## 2017-01-26 DIAGNOSIS — J984 Other disorders of lung: Secondary | ICD-10-CM

## 2017-01-26 DIAGNOSIS — R599 Enlarged lymph nodes, unspecified: Secondary | ICD-10-CM | POA: Diagnosis not present

## 2017-01-26 DIAGNOSIS — Z7189 Other specified counseling: Secondary | ICD-10-CM

## 2017-01-26 DIAGNOSIS — R634 Abnormal weight loss: Secondary | ICD-10-CM | POA: Diagnosis not present

## 2017-01-26 DIAGNOSIS — C7801 Secondary malignant neoplasm of right lung: Secondary | ICD-10-CM

## 2017-01-26 DIAGNOSIS — C221 Intrahepatic bile duct carcinoma: Secondary | ICD-10-CM

## 2017-01-26 DIAGNOSIS — D701 Agranulocytosis secondary to cancer chemotherapy: Secondary | ICD-10-CM | POA: Diagnosis not present

## 2017-01-26 DIAGNOSIS — R110 Nausea: Secondary | ICD-10-CM

## 2017-01-26 DIAGNOSIS — R630 Anorexia: Secondary | ICD-10-CM

## 2017-01-26 DIAGNOSIS — Z5111 Encounter for antineoplastic chemotherapy: Secondary | ICD-10-CM | POA: Diagnosis not present

## 2017-01-26 DIAGNOSIS — K769 Liver disease, unspecified: Secondary | ICD-10-CM

## 2017-01-26 DIAGNOSIS — Z95828 Presence of other vascular implants and grafts: Secondary | ICD-10-CM

## 2017-01-26 DIAGNOSIS — R109 Unspecified abdominal pain: Secondary | ICD-10-CM

## 2017-01-26 LAB — COMPREHENSIVE METABOLIC PANEL
ALT: 15 U/L (ref 0–55)
AST: 18 U/L (ref 5–34)
Albumin: 3.9 g/dL (ref 3.5–5.0)
Alkaline Phosphatase: 46 U/L (ref 40–150)
Anion Gap: 8 mEq/L (ref 3–11)
BUN: 12.8 mg/dL (ref 7.0–26.0)
CO2: 25 mEq/L (ref 22–29)
Calcium: 9.1 mg/dL (ref 8.4–10.4)
Chloride: 108 mEq/L (ref 98–109)
Creatinine: 0.7 mg/dL (ref 0.6–1.1)
EGFR: >90 mL/min/{1.73_m2} (ref >90)
Glucose: 88 mg/dl (ref 70–140)
Potassium: 4.1 mEq/L (ref 3.5–5.1)
Sodium: 141 mEq/L (ref 136–145)
Total Bilirubin: 0.22 mg/dL (ref 0.20–1.20)
Total Protein: 6.3 g/dL — ABNORMAL LOW (ref 6.4–8.3)

## 2017-01-26 LAB — CBC WITH DIFFERENTIAL/PLATELET
BASO%: 1.1 % (ref 0.0–2.0)
Basophils Absolute: 0 10*3/uL (ref 0.0–0.1)
EOS%: 2.6 % (ref 0.0–7.0)
Eosinophils Absolute: 0.1 10*3/uL (ref 0.0–0.5)
HCT: 32.1 % — ABNORMAL LOW (ref 34.8–46.6)
HGB: 10.7 g/dL — ABNORMAL LOW (ref 11.6–15.9)
LYMPH%: 42.1 % (ref 14.0–49.7)
MCH: 30.7 pg (ref 25.1–34.0)
MCHC: 33.3 g/dL (ref 31.5–36.0)
MCV: 92.4 fL (ref 79.5–101.0)
MONO#: 0.3 10*3/uL (ref 0.1–0.9)
MONO%: 10.1 % (ref 0.0–14.0)
NEUT#: 1.4 10*3/uL — ABNORMAL LOW (ref 1.5–6.5)
NEUT%: 44.1 % (ref 38.4–76.8)
Platelets: 320 10*3/uL (ref 145–400)
RBC: 3.47 10*6/uL — ABNORMAL LOW (ref 3.70–5.45)
RDW: 15.3 % — ABNORMAL HIGH (ref 11.2–14.5)
WBC: 3.2 10*3/uL — ABNORMAL LOW (ref 3.9–10.3)
lymph#: 1.3 10*3/uL (ref 0.9–3.3)

## 2017-01-26 LAB — MAGNESIUM: Magnesium: 2.2 mg/dl (ref 1.5–2.5)

## 2017-01-26 MED ORDER — SODIUM CHLORIDE 0.9% FLUSH
10.0000 mL | INTRAVENOUS | Status: DC | PRN
  Administered 2017-01-26: 10 mL
  Filled 2017-01-26: qty 10

## 2017-01-26 MED ORDER — HEPARIN SOD (PORK) LOCK FLUSH 100 UNIT/ML IV SOLN
500.0000 [IU] | Freq: Once | INTRAVENOUS | Status: AC | PRN
  Administered 2017-01-26: 500 [IU]
  Filled 2017-01-26: qty 5

## 2017-01-26 MED ORDER — PALONOSETRON HCL INJECTION 0.25 MG/5ML
0.2500 mg | Freq: Once | INTRAVENOUS | Status: AC
  Administered 2017-01-26: 0.25 mg via INTRAVENOUS

## 2017-01-26 MED ORDER — SODIUM CHLORIDE 0.9 % IV SOLN
Freq: Once | INTRAVENOUS | Status: AC
  Administered 2017-01-26: 12:00:00 via INTRAVENOUS
  Filled 2017-01-26: qty 5

## 2017-01-26 MED ORDER — SODIUM CHLORIDE 0.9 % IV SOLN
Freq: Once | INTRAVENOUS | Status: AC
  Administered 2017-01-26: 10:00:00 via INTRAVENOUS

## 2017-01-26 MED ORDER — DEXTROSE-NACL 5-0.45 % IV SOLN
Freq: Once | INTRAVENOUS | Status: AC
  Administered 2017-01-26: 10:00:00 via INTRAVENOUS
  Filled 2017-01-26: qty 10

## 2017-01-26 MED ORDER — GEMCITABINE HCL CHEMO INJECTION 1 GM/26.3ML
900.0000 mg/m2 | Freq: Once | INTRAVENOUS | Status: AC
  Administered 2017-01-26: 1558 mg via INTRAVENOUS
  Filled 2017-01-26: qty 40.98

## 2017-01-26 MED ORDER — SODIUM CHLORIDE 0.9 % IV SOLN
25.0000 mg/m2 | Freq: Once | INTRAVENOUS | Status: AC
  Administered 2017-01-26: 43 mg via INTRAVENOUS
  Filled 2017-01-26: qty 43

## 2017-01-26 MED ORDER — PALONOSETRON HCL INJECTION 0.25 MG/5ML
INTRAVENOUS | Status: AC
  Filled 2017-01-26: qty 5

## 2017-01-26 MED ORDER — SODIUM CHLORIDE 0.9% FLUSH
10.0000 mL | INTRAVENOUS | Status: DC | PRN
  Administered 2017-01-26: 10 mL via INTRAVENOUS
  Filled 2017-01-26: qty 10

## 2017-01-26 NOTE — Patient Instructions (Signed)
Implanted Port Home Guide An implanted port is a type of central line that is placed under the skin. Central lines are used to provide IV access when treatment or nutrition needs to be given through a person's veins. Implanted ports are used for long-term IV access. An implanted port may be placed because:  You need IV medicine that would be irritating to the small veins in your hands or arms.  You need long-term IV medicines, such as antibiotics.  You need IV nutrition for a long period.  You need frequent blood draws for lab tests.  You need dialysis.  Implanted ports are usually placed in the chest area, but they can also be placed in the upper arm, the abdomen, or the leg. An implanted port has two main parts:  Reservoir. The reservoir is round and will appear as a small, raised area under your skin. The reservoir is the part where a needle is inserted to give medicines or draw blood.  Catheter. The catheter is a thin, flexible tube that extends from the reservoir. The catheter is placed into a large vein. Medicine that is inserted into the reservoir goes into the catheter and then into the vein.  How will I care for my incision site? Do not get the incision site wet. Bathe or shower as directed by your health care provider. How is my port accessed? Special steps must be taken to access the port:  Before the port is accessed, a numbing cream can be placed on the skin. This helps numb the skin over the port site.  Your health care provider uses a sterile technique to access the port. ? Your health care provider must put on a mask and sterile gloves. ? The skin over your port is cleaned carefully with an antiseptic and allowed to dry. ? The port is gently pinched between sterile gloves, and a needle is inserted into the port.  Only "non-coring" port needles should be used to access the port. Once the port is accessed, a blood return should be checked. This helps ensure that the port  is in the vein and is not clogged.  If your port needs to remain accessed for a constant infusion, a clear (transparent) bandage will be placed over the needle site. The bandage and needle will need to be changed every week, or as directed by your health care provider.  Keep the bandage covering the needle clean and dry. Do not get it wet. Follow your health care provider's instructions on how to take a shower or bath while the port is accessed.  If your port does not need to stay accessed, no bandage is needed over the port.  What is flushing? Flushing helps keep the port from getting clogged. Follow your health care provider's instructions on how and when to flush the port. Ports are usually flushed with saline solution or a medicine called heparin. The need for flushing will depend on how the port is used.  If the port is used for intermittent medicines or blood draws, the port will need to be flushed: ? After medicines have been given. ? After blood has been drawn. ? As part of routine maintenance.  If a constant infusion is running, the port may not need to be flushed.  How long will my port stay implanted? The port can stay in for as long as your health care provider thinks it is needed. When it is time for the port to come out, surgery will be   done to remove it. The procedure is similar to the one performed when the port was put in. When should I seek immediate medical care? When you have an implanted port, you should seek immediate medical care if:  You notice a bad smell coming from the incision site.  You have swelling, redness, or drainage at the incision site.  You have more swelling or pain at the port site or the surrounding area.  You have a fever that is not controlled with medicine.  This information is not intended to replace advice given to you by your health care provider. Make sure you discuss any questions you have with your health care provider. Document  Released: 08/16/2005 Document Revised: 01/22/2016 Document Reviewed: 04/23/2013 Elsevier Interactive Patient Education  2017 Else[employer] Inc.  

## 2017-01-26 NOTE — Patient Instructions (Signed)
[place] Discharge Instructions for Patients Receiving Chemotherapy  Today you received the following chemotherapy agents Gemzar/Cispatin  To help prevent nausea and vomiting after your treatment, we encourage you to take your nausea medication   If you develop nausea and vomiting that is not controlled by your nausea medication, call the clinic.   BELOW ARE SYMPTOMS THAT SHOULD BE REPORTED IMMEDIATELY:  *FEVER GREATER THAN 100.5 F  *CHILLS WITH OR WITHOUT FEVER  NAUSEA AND VOMITING THAT IS NOT CONTROLLED WITH YOUR NAUSEA MEDICATION  *UNUSUAL SHORTNESS OF BREATH  *UNUSUAL BRUISING OR BLEEDING  TENDERNESS IN MOUTH AND THROAT WITH OR WITHOUT PRESENCE OF ULCERS  *URINARY PROBLEMS  *BOWEL PROBLEMS  UNUSUAL RASH Items with * indicate a potential emergency and should be followed up as soon as possible.  Feel free to call the clinic you have any questions or concerns. The clinic phone number is [phone].  Please show the [place] at check-in to the Emergency Department and triage nurse.

## 2017-01-26 NOTE — Progress Notes (Signed)
Okay to treat despite Neut = 1.4 today, per Dr. Burr Medico.

## 2017-01-26 NOTE — Telephone Encounter (Signed)
Gave patient AVS and calender per 5/30 LOS -

## 2017-01-29 ENCOUNTER — Ambulatory Visit (INDEPENDENT_AMBULATORY_CARE_PROVIDER_SITE_OTHER): Payer: Federal, State, Local not specified - PPO | Admitting: Family Medicine

## 2017-01-29 ENCOUNTER — Encounter: Payer: Self-pay | Admitting: Family Medicine

## 2017-01-29 VITALS — BP 130/80 | HR 68 | Temp 98.9°F | Ht 66.0 in | Wt 137.2 lb

## 2017-01-29 DIAGNOSIS — L0292 Furuncle, unspecified: Secondary | ICD-10-CM

## 2017-01-29 DIAGNOSIS — L03115 Cellulitis of right lower limb: Secondary | ICD-10-CM

## 2017-01-29 MED ORDER — DOXYCYCLINE HYCLATE 100 MG PO TABS: 100.0000 mg | ORAL_TABLET | Freq: Two times a day (BID) | ORAL | 0 refills | Status: DC

## 2017-01-29 NOTE — Patient Instructions (Signed)
Warm compresses 2 times daily.  Start the antibiotic and take as prescribed.  Follow up with your doctor in 3-5 days.  I hope you are feeling better soon! Seek care immediately if worsening, new concerns or you are not improving with treatment.

## 2017-01-29 NOTE — Progress Notes (Signed)
HPI:  Acute Saturday clinic visit for skin boil: -started 1 week ago  -R groin -reports her oncologist looked at it and advised warm compresses -has gotten a bit worse the last 2 days and has some redness around it and is tender -no fevers, malaise, drainage  ROS: See pertinent positives and negatives per HPI.  Past Medical History:  Diagnosis Date  . Allergy    SEASONAL  . Anemia    HIGH SCHOOL  . Atopic eczema   . cholangio ca dx'd 07/12/16  . Fibrocystic breast disease   . GERD (gastroesophageal reflux disease)   . Hx of migraines    seasonal   . Pneumonia    6 years ago   . TIA (transient ischemic attack)     Past Surgical History:  Procedure Laterality Date  . EUS N/A 07/07/2016   Procedure: UPPER ENDOSCOPIC ULTRASOUND (EUS) RADIAL;  Surgeon: [name], MD;  Location: [facility];  Service: Endoscopy;  Laterality: N/A;  . IR GENERIC HISTORICAL  08/03/2016   IR US GUIDE VASC ACCESS RIGHT 08/03/2016 Corrie Mckusick, DO [facility]  . IR GENERIC HISTORICAL  08/03/2016   IR FLUORO GUIDE PORT INSERTION RIGHT 08/03/2016 Corrie Mckusick, DO [facility]  . MYOMECTOMY  2002    Family History  Problem Relation Age of Onset  . Gout Mother   . Diabetes Mother   . Hypertension Father   . Diabetes Father   . Breast cancer Paternal Aunt   . Cancer Maternal Grandfather        throat cancer   . Cancer Cousin 40       GI cancer   . Thyroid disease Neg Hx     Social History   Social History  . Marital status: Married    Spouse name: N/A  . Number of children: 0  . Years of education: 45   Occupational History  . [employer]  .  [employer]   Social History Main Topics  . Smoking status: Never Smoker  . Smokeless tobacco: Never Used  . Alcohol use Yes     Comment: maybe 3 times a year  . Drug use: No  . Sexual activity: Yes    Partners: Male   Other Topics Concern  . None   Social History Narrative  . None     Current Outpatient Prescriptions:   .  ALPRAZolam (XANAX) 0.5 MG tablet, Take 1 tablet (0.5 mg total) by mouth as needed for anxiety., Disp: 10 tablet, Rfl: 0 .  aspirin 81 MG tablet, Take 81 mg by mouth daily., Disp: , Rfl:  .  cetirizine (ZYRTEC) 10 MG tablet, Take 10 mg by mouth daily as needed. For allergies , Disp: , Rfl:  .  Cholecalciferol (VITAMIN D3) 5000 UNITS CAPS, Take 1 capsule by mouth daily., Disp: , Rfl:  .  COLOSTRUM PO, Take 1.25 g by mouth 2 (two) times daily. 1.25 grams twice daily, Disp: , Rfl:  .  dicyclomine (BENTYL) 20 MG tablet, 3 (three) times daily as needed. , Disp: , Rfl:  .  dronabinol (MARINOL) 5 MG capsule, Take 1 capsule (5 mg total) by mouth 2 (two) times daily before a meal., Disp: 60 capsule, Rfl: 1 .  HYDROcodone-acetaminophen (NORCO) 5-325 MG tablet, Take 1-2 tablets by mouth every 6 (six) hours as needed for severe pain., Disp: 60 tablet, Rfl: 0 .  lidocaine-prilocaine (EMLA) cream, Apply 1 application topically as needed. Apply to portacath 1 1/2 hours -  2 hours prior to procedures as needed., Disp: 30 g, Rfl: 1 .  meclizine (ANTIVERT) 50 MG tablet, Take 1 tablet (50 mg total) by mouth 3 (three) times daily as needed., Disp: 30 tablet, Rfl: 0 .  Menaquinone-7 (VITAMIN K2) 40 MCG TABS, Take by mouth., Disp: , Rfl:  .  mometasone (ELOCON) 0.1 % lotion, Apply topically daily., Disp: 60 mL, Rfl: 0 .  omeprazole (PRILOSEC) 40 MG capsule, Take 1 capsule (40 mg total) by mouth 2 (two) times daily before a meal., Disp: 60 capsule, Rfl: 11 .  ondansetron (ZOFRAN) 8 MG tablet, Take 1 tablet (8 mg total) by mouth every 8 (eight) hours as needed for nausea or vomiting., Disp: 20 tablet, Rfl: 1 .  Prenatal Vit-Fe Fumarate-FA (PRENATAL MULTIVITAMIN) TABS tablet, Take 2 tablets by mouth daily at 12 noon., Disp: , Rfl:  .  triamcinolone cream (KENALOG) 0.1 %, Apply topically., Disp: , Rfl:  .  VITAMIN K PO, Take 1 capsule by mouth daily., Disp: , Rfl:  .  doxycycline (VIBRA-TABS) 100 MG tablet, Take 1 tablet  (100 mg total) by mouth 2 (two) times daily., Disp: 10 tablet, Rfl: 0 No current facility-administered medications for this visit.   Facility-Administered Medications Ordered in Other Visits:  .  alteplase (CATHFLO ACTIVASE) injection 2 mg, 2 mg, Intracatheter, Once PRN, Truitt Merle, MD .  heparin lock flush 100 unit/mL, 250 Units, Intracatheter, Once PRN, Truitt Merle, MD .  sodium chloride flush (NS) 0.9 % injection 10 mL, 10 mL, Intracatheter, PRN, Truitt Merle, MD, 10 mL at 09/09/16 1645  EXAM:  Vitals:   01/29/17 1225  BP: 130/80  Pulse: 68  Temp: 98.9 F (37.2 C)    Body mass index is 22.15 kg/m.  GENERAL: vitals reviewed and listed above, alert, oriented, appears well hydrated and in no acute distress  HEENT: atraumatic, conjunttiva clear, no obvious abnormalities on inspection of external nose and ears  NECK: no obvious masses on inspection  MS: moves all extremities without noticeable abnormality  SKIN: small erythematous papule R groin with small amount surrounding erythema and mild induration of skin approx 2x1.5 in diameter, no definite fluctuance, drainage or streaking  PSYCH: pleasant and cooperative, no obvious depression or anxiety  ASSESSMENT AND PLAN:  Discussed the following assessment and plan:  Boil  Cellulitis of right lower extremity  -we discussed possible serious and likely etiologies, workup and treatment, treatment risks and return precautions -after this discussion, Kimbery opted for abx (doxy for staph coverage, may need addition keflex if not improvoing - she knows to follow up), compresses and close monitoring - declined I and D to eval for abscess but agrees to seek prompt care if any worsening or not improving with tx -of course, we advised Shaunie  to return or notify a doctor immediately if symptoms worsen or persist or new concerns arise.    Patient Instructions  Warm compresses 2 times daily.  Start the antibiotic and take as  prescribed.  Follow up with your doctor in 3-5 days.  I hope you are feeling better soon! Seek care immediately if worsening, new concerns or you are not improving with treatment.       Colin Benton R., DO

## 2017-01-31 ENCOUNTER — Encounter: Payer: Self-pay | Admitting: Family Medicine

## 2017-01-31 ENCOUNTER — Telehealth: Payer: Self-pay | Admitting: *Deleted

## 2017-01-31 ENCOUNTER — Ambulatory Visit (INDEPENDENT_AMBULATORY_CARE_PROVIDER_SITE_OTHER): Payer: Federal, State, Local not specified - PPO | Admitting: Family Medicine

## 2017-01-31 VITALS — BP 106/68 | HR 98 | Temp 98.1°F | Resp 16 | Ht 66.0 in | Wt 131.4 lb

## 2017-01-31 DIAGNOSIS — L03119 Cellulitis of unspecified part of limb: Secondary | ICD-10-CM

## 2017-01-31 DIAGNOSIS — L02419 Cutaneous abscess of limb, unspecified: Secondary | ICD-10-CM

## 2017-01-31 NOTE — Telephone Encounter (Signed)
Received vm call from pt stating that she went to urgent care sat with ingrown hair in her groin that may need to be drained.  She wants to know if this will keep her from getting a treatment/neulasta this week. Reviewed chart & pt was seen & noted cellulitis.  ATB given & warm compresses to be used.  Message to Dr Burr Medico.

## 2017-01-31 NOTE — Progress Notes (Signed)
Patient ID: Nancy [name], female   DOB: 05/26/1966, 51 y.o.   MRN: [id number]     Subjective:  I acted as a Education administrator for Dr. Carollee Herter.  Guerry Bruin, Golden   Patient ID: Nancy [name], female    DOB: [birth year], 51 y.o.   MRN: [id number]  Chief Complaint  Patient presents with  . ingrown hair groin area    HPI  Patient is in today for ingrown hair in groin area.  She went to see Dr. Maudie Mercury at [facility] and he gave her doxycycline.  She has been using hot compresses.  She stated it was getting better but after having chemo it gotten worse. It is now a shiny bubble.    Patient Care Team: Carollee Herter, Alferd Apa, DO as PCP - General Pyrtle, Lajuan Lines, MD as Consulting Physician (Gastroenterology)   Past Medical History:  Diagnosis Date  . Allergy    SEASONAL  . Anemia    HIGH SCHOOL  . Atopic eczema   . cholangio ca dx'd 07/12/16  . Fibrocystic breast disease   . GERD (gastroesophageal reflux disease)   . Hx of migraines    seasonal   . Pneumonia    6 years ago   . TIA (transient ischemic attack)     Past Surgical History:  Procedure Laterality Date  . EUS N/A 07/07/2016   Procedure: UPPER ENDOSCOPIC ULTRASOUND (EUS) RADIAL;  Surgeon: [name], MD;  Location: [facility];  Service: Endoscopy;  Laterality: N/A;  . IR GENERIC HISTORICAL  08/03/2016   IR US GUIDE VASC ACCESS RIGHT 08/03/2016 Corrie Mckusick, DO [facility]  . IR GENERIC HISTORICAL  08/03/2016   IR FLUORO GUIDE PORT INSERTION RIGHT 08/03/2016 Corrie Mckusick, DO [facility]  . MYOMECTOMY  2002    Family History  Problem Relation Age of Onset  . Gout Mother   . Diabetes Mother   . Hypertension Father   . Diabetes Father   . Breast cancer Paternal Aunt   . Cancer Maternal Grandfather        throat cancer   . Cancer Cousin 28       GI cancer   . Thyroid disease Neg Hx     Social History   Social History  . Marital status: Married    Spouse name: N/A  . Number of children: 0  . Years of  education: 5   Occupational History  . [employer]  .  [employer]   Social History Main Topics  . Smoking status: Never Smoker  . Smokeless tobacco: Never Used  . Alcohol use Yes     Comment: maybe 3 times a year  . Drug use: No  . Sexual activity: Yes    Partners: Male   Other Topics Concern  . Not on file   Social History Narrative  . No narrative on file    Outpatient Medications Prior to Visit  Medication Sig Dispense Refill  . ALPRAZolam (XANAX) 0.5 MG tablet Take 1 tablet (0.5 mg total) by mouth as needed for anxiety. 10 tablet 0  . aspirin 81 MG tablet Take 81 mg by mouth daily.    . cetirizine (ZYRTEC) 10 MG tablet Take 10 mg by mouth daily as needed. For allergies     . Cholecalciferol (VITAMIN D3) 5000 UNITS CAPS Take 1 capsule by mouth daily.    . COLOSTRUM PO Take 1.25 g by mouth 2 (two) times daily. 1.25 grams twice daily    .  dicyclomine (BENTYL) 20 MG tablet 3 (three) times daily as needed.     . doxycycline (VIBRA-TABS) 100 MG tablet Take 1 tablet (100 mg total) by mouth 2 (two) times daily. 10 tablet 0  . dronabinol (MARINOL) 5 MG capsule Take 1 capsule (5 mg total) by mouth 2 (two) times daily before a meal. 60 capsule 1  . HYDROcodone-acetaminophen (NORCO) 5-325 MG tablet Take 1-2 tablets by mouth every 6 (six) hours as needed for severe pain. 60 tablet 0  . lidocaine-prilocaine (EMLA) cream Apply 1 application topically as needed. Apply to portacath 1 1/2 hours - 2 hours prior to procedures as needed. 30 g 1  . meclizine (ANTIVERT) 50 MG tablet Take 1 tablet (50 mg total) by mouth 3 (three) times daily as needed. 30 tablet 0  . Menaquinone-7 (VITAMIN K2) 40 MCG TABS Take by mouth.    . mometasone (ELOCON) 0.1 % lotion Apply topically daily. 60 mL 0  . omeprazole (PRILOSEC) 40 MG capsule Take 1 capsule (40 mg total) by mouth 2 (two) times daily before a meal. 60 capsule 11  . ondansetron (ZOFRAN) 8 MG tablet Take 1 tablet (8 mg total) by mouth every 8  (eight) hours as needed for nausea or vomiting. 20 tablet 1  . Prenatal Vit-Fe Fumarate-FA (PRENATAL MULTIVITAMIN) TABS tablet Take 2 tablets by mouth daily at 12 noon.    . triamcinolone cream (KENALOG) 0.1 % Apply topically.    Marland Kitchen VITAMIN K PO Take 1 capsule by mouth daily.     Facility-Administered Medications Prior to Visit  Medication Dose Route Frequency Provider Last Rate Last Dose  . alteplase (CATHFLO ACTIVASE) injection 2 mg  2 mg Intracatheter Once PRN Truitt Merle, MD      . heparin lock flush 100 unit/mL  250 Units Intracatheter Once PRN Truitt Merle, MD      . sodium chloride flush (NS) 0.9 % injection 10 mL  10 mL Intracatheter PRN Truitt Merle, MD   10 mL at 09/09/16 1645    Allergies  Allergen Reactions  . Penicillins Anaphylaxis    Has patient had a PCN reaction causing immediate rash, facial/tongue/throat swelling, SOB or lightheadedness with hypotensionYes Swelling in throat  Has patient had a PCN reaction causing severe rash involving mucus membranes or skin necrosis:/No Has patient had a PCN reaction that required hospitalization/No Has patient had a PCN reaction occurring within the last 10 years:NO If all of the above answers are "NO", then may proceed with Cephalosporin use.   . Lipitor [Atorvastatin] Palpitations    Tingling, flushing    Review of Systems  Constitutional: Negative for fever and malaise/fatigue.  HENT: Negative for congestion.   Eyes: Negative for blurred vision.  Respiratory: Negative for cough and shortness of breath.   Cardiovascular: Negative for chest pain, palpitations and leg swelling.  Gastrointestinal: Negative for vomiting.  Musculoskeletal: Negative for back pain.  Skin: Negative for rash.       Boil in right groin area   Neurological: Negative for loss of consciousness and headaches.       Objective:    Physical Exam  Skin:     Nursing note and vitals reviewed.   BP 106/68 (BP Location: Left Arm, Cuff Size: Normal)   Pulse  98   Temp 98.1 F (36.7 C) (Oral)   Resp 16   Ht _0  (1.676 m)   Wt 131 lb 6.4 oz (59.6 kg)   SpO2 99%   BMI 21.21 kg/m  Wt  Readings from Last 3 Encounters:  01/31/17 131 lb 6.4 oz (59.6 kg)  01/29/17 137 lb 4 oz (62.3 kg)  01/26/17 139 lb 1.6 oz (63.1 kg)   BP Readings from Last 3 Encounters:  01/31/17 106/68  01/29/17 130/80  01/26/17 (!) 142/74     Immunization History  Administered Date(s) Administered  . Influenza,inj,Quad PF,36+ Mos 08/09/2016  . Td 07/24/2003  . Tdap 01/24/2013    Health Maintenance  Topic Date Due  . HIV Screening  03/24/1982  . INFLUENZA VACCINE  03/30/2017  . MAMMOGRAM  05/13/2017  . PAP SMEAR  03/13/2019  . COLONOSCOPY  05/23/2019  . TETANUS/TDAP  01/25/2023    Lab Results  Component Value Date   WBC 3.2 (L) 01/26/2017   HGB 10.7 (L) 01/26/2017   HCT 32.1 (L) 01/26/2017   PLT 320 01/26/2017   GLUCOSE 88 01/26/2017   CHOL 144 03/16/2016   TRIG 48.0 03/16/2016   HDL 52.90 03/16/2016   LDLCALC 82 03/16/2016   ALT 15 01/26/2017   AST 18 01/26/2017   NA 141 01/26/2017   K 4.1 01/26/2017   CL 107 09/08/2016   CREATININE 0.7 01/26/2017   BUN 12.8 01/26/2017   CO2 25 01/26/2017   TSH 1.89 03/16/2016   INR 0.98 08/03/2016   HGBA1C 5.7 (H) 06/10/2015    Lab Results  Component Value Date   TSH 1.89 03/16/2016   Lab Results  Component Value Date   WBC 3.2 (L) 01/26/2017   HGB 10.7 (L) 01/26/2017   HCT 32.1 (L) 01/26/2017   MCV 92.4 01/26/2017   PLT 320 01/26/2017   Lab Results  Component Value Date   NA 141 01/26/2017   K 4.1 01/26/2017   CHLORIDE 108 01/26/2017   CO2 25 01/26/2017   GLUCOSE 88 01/26/2017   BUN 12.8 01/26/2017   CREATININE 0.7 01/26/2017   BILITOT 0.22 01/26/2017   ALKPHOS 46 01/26/2017   AST 18 01/26/2017   ALT 15 01/26/2017   PROT 6.3 (L) 01/26/2017   ALBUMIN 3.9 01/26/2017   CALCIUM 9.1 01/26/2017   ANIONGAP 8 01/26/2017   EGFR >90 01/26/2017   GFR 116.16 07/02/2016   Lab Results    Component Value Date   CHOL 144 03/16/2016   Lab Results  Component Value Date   HDL 52.90 03/16/2016   Lab Results  Component Value Date   LDLCALC 82 03/16/2016   Lab Results  Component Value Date   TRIG 48.0 03/16/2016   Lab Results  Component Value Date   CHOLHDL 3 03/16/2016   Lab Results  Component Value Date   HGBA1C 5.7 (H) 06/10/2015         Assessment & Plan:   Problem List Items Addressed This Visit    None    Visit Diagnoses    Cellulitis and abscess of leg    -  Primary   Relevant Orders   Ambulatory referral to General Surgery    pt unable to tolerate I &D  Refer to surgery asap On doxy  I am having Ms. Nancie Neas maintain her cetirizine, Vitamin D3, VITAMIN K PO, prenatal multivitamin, aspirin, mometasone, meclizine, omeprazole, ALPRAZolam, triamcinolone cream, Vitamin K2, COLOSTRUM PO, lidocaine-prilocaine, ondansetron, dicyclomine, HYDROcodone-acetaminophen, dronabinol, and doxycycline.  No orders of the defined types were placed in this encounter.   CMA served as Education administrator during this visit. History, Physical and Plan performed by medical provider. Documentation and orders reviewed and attested to.  [name], DO

## 2017-01-31 NOTE — Patient Instructions (Signed)
Incision and Drainage, Care After  Refer to this sheet in the next few weeks. These instructions provide you with information about caring for yourself after your procedure. Your health care provider may also give you more specific instructions. Your treatment has been planned according to current medical practices, but problems sometimes occur. Call your health care provider if you have any problems or questions after your procedure.  What can I expect after the procedure?  After the procedure, it is common to have:  · Pain or discomfort around your incision site.  · Drainage from your incision.     Follow these instructions at home:  ·   · Take over-the-counter and prescription medicines only as told by your health care provider.  · If you were prescribed an antibiotic medicine, take it as told by your health care provider. Do not stop taking the antibiotic even if you start to feel better.  · Follow instructions from your health care provider about:  ? How to take care of your incision.  ? When and how you should change your packing and bandage (dressing). Wash your hands with soap and water before you change your dressing. If soap and water are not available, use hand sanitizer.  ? When you should remove your dressing.  · Do not take baths, swim, or use a hot tub until your health care provider approves.  · Keep all follow-up visits as told by your health care provider. This is important.  · Check your incision area every day for signs of infection. Check for:  ? More redness, swelling, or pain.  ? More fluid or blood.  ? Warmth.  ? Pus or a bad smell.  Contact a health care provider if:  · Your cyst or abscess returns.  · You have a fever.  · You have more redness, swelling, or pain around your incision.  · You have more fluid or blood coming from your incision.  · Your incision feels warm to the touch.  · You have pus or a bad smell coming from your incision.  Get help right away if:  · You have severe pain or  bleeding.  · You cannot eat or drink without vomiting.  · You have decreased urine output.  · You become short of breath.  · You have chest pain.  · You cough up blood.  · The area where the incision and drainage occurred becomes numb or it tingles.  This information is not intended to replace advice given to you by your health care provider. Make sure you discuss any questions you have with your health care provider.  Document Released: 11/08/2011 Document Revised: 01/16/2016 Document Reviewed: 06/06/2015  Elsevier Interactive Patient Education © 2017 [employer]   

## 2017-01-31 NOTE — Telephone Encounter (Signed)
Also received message from [facility] Call center from 01/29/17 reporting above issue.  Informed pt tho keep f/u appt with Dr Burr Medico 02/02/17.  Pt reports that her PCP is sending her to a Psychologist, sport and exercise.  Pt expressed understanding.

## 2017-02-01 ENCOUNTER — Telehealth: Payer: Self-pay | Admitting: Hematology

## 2017-02-01 NOTE — Telephone Encounter (Signed)
Injection appointment confirmed with patient.

## 2017-02-01 NOTE — Telephone Encounter (Signed)
02/03/17 Injection appointment was adjusted to 4:15 pm, per Injection nurse/John.

## 2017-02-02 ENCOUNTER — Other Ambulatory Visit (HOSPITAL_BASED_OUTPATIENT_CLINIC_OR_DEPARTMENT_OTHER): Payer: Federal, State, Local not specified - PPO

## 2017-02-02 ENCOUNTER — Ambulatory Visit: Payer: Federal, State, Local not specified - PPO

## 2017-02-02 VITALS — BP 119/69 | HR 85 | Temp 98.3°F | Resp 16

## 2017-02-02 DIAGNOSIS — C7801 Secondary malignant neoplasm of right lung: Principal | ICD-10-CM

## 2017-02-02 DIAGNOSIS — C221 Intrahepatic bile duct carcinoma: Secondary | ICD-10-CM

## 2017-02-02 LAB — CBC WITH DIFFERENTIAL/PLATELET
BASO%: 0.3 % (ref 0.0–2.0)
Basophils Absolute: 0 10*3/uL (ref 0.0–0.1)
EOS%: 1.2 % (ref 0.0–7.0)
Eosinophils Absolute: 0 10*3/uL (ref 0.0–0.5)
HCT: 30.3 % — ABNORMAL LOW (ref 34.8–46.6)
HGB: 9.9 g/dL — ABNORMAL LOW (ref 11.6–15.9)
LYMPH%: 49.4 % (ref 14.0–49.7)
MCH: 30.1 pg (ref 25.1–34.0)
MCHC: 32.7 g/dL (ref 31.5–36.0)
MCV: 92.1 fL (ref 79.5–101.0)
MONO#: 0.1 10*3/uL (ref 0.1–0.9)
MONO%: 4 % (ref 0.0–14.0)
NEUT#: 1.5 10*3/uL (ref 1.5–6.5)
NEUT%: 45.1 % (ref 38.4–76.8)
Platelets: 183 10*3/uL (ref 145–400)
RBC: 3.29 10*6/uL — ABNORMAL LOW (ref 3.70–5.45)
RDW: 13.6 % (ref 11.2–14.5)
WBC: 3.2 10*3/uL — ABNORMAL LOW (ref 3.9–10.3)
lymph#: 1.6 10*3/uL (ref 0.9–3.3)

## 2017-02-02 LAB — COMPREHENSIVE METABOLIC PANEL
ALT: 14 U/L (ref 0–55)
AST: 16 U/L (ref 5–34)
Albumin: 3.9 g/dL (ref 3.5–5.0)
Alkaline Phosphatase: 49 U/L (ref 40–150)
Anion Gap: 7 mEq/L (ref 3–11)
BUN: 14.7 mg/dL (ref 7.0–26.0)
CO2: 25 mEq/L (ref 22–29)
Calcium: 9.2 mg/dL (ref 8.4–10.4)
Chloride: 107 mEq/L (ref 98–109)
Creatinine: 0.8 mg/dL (ref 0.6–1.1)
EGFR: >90 mL/min/{1.73_m2} (ref >90)
Glucose: 93 mg/dl (ref 70–140)
Potassium: 4.4 mEq/L (ref 3.5–5.1)
Sodium: 140 mEq/L (ref 136–145)
Total Bilirubin: <0.22 mg/dL (ref 0.20–1.20)
Total Protein: 6.6 g/dL (ref 6.4–8.3)

## 2017-02-02 LAB — MAGNESIUM: Magnesium: 2.2 mg/dl (ref 1.5–2.5)

## 2017-02-02 MED ORDER — SODIUM CHLORIDE 0.9% FLUSH
10.0000 mL | INTRAVENOUS | Status: DC | PRN
  Filled 2017-02-02: qty 10

## 2017-02-02 MED ORDER — HEPARIN SOD (PORK) LOCK FLUSH 100 UNIT/ML IV SOLN
500.0000 [IU] | Freq: Once | INTRAVENOUS | Status: DC | PRN
  Filled 2017-02-02: qty 5

## 2017-02-02 NOTE — Progress Notes (Signed)
   Pt stated she was seen Saturday 01/29/17 for right side groin "in-grown hair/abcess". Received abx at that time. Pt stated on Monday 01/31/17 she saw PCP who sent pt to Dr Dalbert Batman who performed a procedure on area. Dr Dalbert Batman gave pt another abx to take, and d/c previous abx. Pt states this am 02/02/17" the abcess is draining, no s/s if infection" . Pt stated she had not run any fevers during this time. Dr Burr Medico aware, came to infusion room and will not tx pt today, will reschedule tx for next week.

## 2017-02-03 ENCOUNTER — Other Ambulatory Visit: Payer: Self-pay | Admitting: Hematology

## 2017-02-03 ENCOUNTER — Ambulatory Visit: Payer: Federal, State, Local not specified - PPO

## 2017-02-03 LAB — CANCER ANTIGEN 19-9: CA 19-9: 37 U/mL — ABNORMAL HIGH (ref 0–35)

## 2017-02-07 ENCOUNTER — Telehealth: Payer: Self-pay | Admitting: Hematology

## 2017-02-07 NOTE — Telephone Encounter (Signed)
Per response from YF re 6/6 schedule message - ok to schedule lab/fu/tx 6/15 and she will see patient in infusion. Also per YF cxd 6/18 appointments and added injection for 6/26. Left message for patient with appointment appointment information. Schedule mailed.

## 2017-02-08 NOTE — Progress Notes (Signed)
[place]  [phone] [phone]  Clinic Follow Up Note   Patient Care Team: Carollee Herter, Alferd Apa, DO as PCP - General Pyrtle, Lajuan Lines, MD as Consulting Physician (Gastroenterology) 02/11/2017  CHIEF COMPLAINTS:  Follow up metastatic intrahepatic cholangiocarcinoma  Oncology History   Metastatic cholangiocarcinoma to lung [facility])   Staging form: Perihilar Bile Ducts, AJCC 7th Edition   - Clinical stage from 07/07/2016: Stage IVB (T2b, N1, M1) - Signed by Truitt Merle, MD on 07/28/2016      Metastatic cholangiocarcinoma to lung ([place])   05/22/2014 Procedure    Routine screening colonoscopy showed a sessile polyp measuring 4 mm in the sigmoid colon, removed, otherwise negative.      06/29/2016 Imaging    CT chest, abdomen and pelvis showed 2 indeterminant nodule in left and the right lung, 4-82mm, indeterminate a hypovascular liver lesions largest in the caudate lobe measuring 2.6 cm, mild abdominal lymphadenopathy in the gastrohepatic ligament and portocaval space.      07/02/2016 Imaging    Abdominal MRI with and without contrast showed 2 lobular lesions in the caudate lobe, and is regular lymph nodes in the gastrohepatic ligament which is adjacent to the liver lesion, suspicious for malignancy. 2 small lesions in the right hepatic lobe are indeterminate, but concerning for metastasis.      07/07/2016 Procedure    EGD was negative, EUS biopsy of the liver and adjacent lymph node      07/07/2016 Initial Diagnosis    Metastatic cholangiocarcinoma to lung ([place])      07/07/2016 Initial Biopsy    Fine-needle aspiration of the liver lesion in caudate lobe and adjacent lymph nodes both showed metastatic adenocarcinoma.      08/11/2016 -  Chemotherapy    Cisplatin 25 mg/m, gemcitabine 1000 mg/m, on day one and 8, every 21 days        10/18/2016 Imaging    CT CAP  IMPRESSION: 1. Again noted are multifocal liver metastasis. Overall there has been  no significant interval change in overall volume of liver metastases. 2. Similar appearance of upper abdominal adenopathy 3. Slight decrease in size of small pulmonary metastases.      01/25/2017 Imaging    CT CAP W Contrast 01/25/17 IMPRESSION: 1. Response to therapy. 2. Similar to minimal decrease in pulmonary nodules/metastasis. 3. Improvement in hepatic disease burden. 4. Decrease in abdominal adenopathy. 5. Significantly age advanced atherosclerosis, including within the coronary arteries. 6. Uterine fibroids. 7. Suspect a complex cyst in the left ovary. Recommend attention on follow-up.        HISTORY OF PRESENTING ILLNESS: 07/28/17 Nancy [name] 51 y.o. female is here because of Her recently diagnosed metastatic glandular carcinoma. She is accompanied by her husband and mother to my clinic today.  She has been having RUQ abdominal pain since mid 04/2016, she was seen by PCP and was treated for gas with GI cocktail and pepcide, which she did not help much. Her pain got worse, and radiates to right shoulder, she denies significant nausea, or bloating, BM normal, no fever, cough or dyspnea. She recently noticed mild chest discomfort in the low sternum area. She initially had the lab, ultrasound done by her primary care physician, which was unrevealing. She was referred to GI Dr. Hilarie Fredrickson and underwent EGD and CT scan, which showed a lobulated mass in the caudate lobe of liver, with at adjacent adenopathy. He underwent EUS and fine-needle biopsy of the liver mass and lymph nodes, all reviewed adenocarcinoma.  She has lost about 7lbs in the past 3 months. She has mild fatigue, but able to function at home. She has stopped working due to her abdominal pain and fatigue. She takes Norco every 1-3 times a day, and her pain seems not well controlled.  CURRENT THERAPY: Cisplatin and gemcitabine on Day 1, 8 every 21 days, with neulasta on day 9, Gemcitabine dosed reduced to 800 mg/m due to  cytopenias, increased to 900mg /m2 on 12/06/2016.  INTERIM HISTORY:  Nancy [name] returns for follow up and cycle 9 of treatment in the infusion room.She is accompanied by her friend. She has been doing well and her incision has healed well.  She is experiencing allergies. Denies cough. Denies any other pains. Appetite has improved.   MEDICAL HISTORY:  Past Medical History:  Diagnosis Date  . Allergy    SEASONAL  . Anemia    HIGH SCHOOL  . Atopic eczema   . cholangio ca dx'd 07/12/16  . Fibrocystic breast disease   . GERD (gastroesophageal reflux disease)   . Hx of migraines    seasonal   . Pneumonia    6 years ago   . TIA (transient ischemic attack)     SURGICAL HISTORY: Past Surgical History:  Procedure Laterality Date  . EUS N/A 07/07/2016   Procedure: UPPER ENDOSCOPIC ULTRASOUND (EUS) RADIAL;  Surgeon: [name], MD;  Location: [facility] ENDOSCOPY;  Service: Endoscopy;  Laterality: N/A;  . IR GENERIC HISTORICAL  08/03/2016   IR US GUIDE VASC ACCESS RIGHT 08/03/2016 Corrie Mckusick, DO [facility]-INTERV RAD  . IR GENERIC HISTORICAL  08/03/2016   IR FLUORO GUIDE PORT INSERTION RIGHT 08/03/2016 Corrie Mckusick, DO [facility]  . MYOMECTOMY  2002    SOCIAL HISTORY: Social History   Social History  . Marital status: Married    Spouse name: N/A  . Number of children: 0  . 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  [employer]   Social History Main Topics  . Smoking status: Never Smoker  . Smokeless tobacco: Never Used  . Alcohol use Yes     Comment: maybe 3 times a year  . Drug use: No  . Sexual activity: Yes    Partners: Male   Other Topics Concern  . Not on file   Social History Narrative  . No narrative on file    FAMILY HISTORY: Family History  Problem Relation Age of Onset  . Gout Mother   . Diabetes Mother   . Hypertension Father   . Diabetes Father   . Breast cancer Paternal Aunt   . Cancer Maternal Grandfather        throat cancer   .  Cancer Cousin 57       GI cancer   . Thyroid disease Neg Hx     ALLERGIES:  is allergic to penicillins and lipitor [atorvastatin].  MEDICATIONS:  Current Outpatient Prescriptions  Medication Sig Dispense Refill  . ALPRAZolam (XANAX) 0.5 MG tablet Take 1 tablet (0.5 mg total) by mouth as needed for anxiety. 10 tablet 0  . aspirin 81 MG tablet Take 81 mg by mouth daily.    . cetirizine (ZYRTEC) 10 MG tablet Take 10 mg by mouth daily as needed. For allergies     . Cholecalciferol (VITAMIN D3) 5000 UNITS CAPS Take 1 capsule by mouth daily.    . COLOSTRUM PO Take 1.25 g by mouth 2 (two) times daily. 1.25 grams twice daily    .  dicyclomine (BENTYL) 20 MG tablet 3 (three) times daily as needed.     . doxycycline (VIBRA-TABS) 100 MG tablet Take 1 tablet (100 mg total) by mouth 2 (two) times daily. 10 tablet 0  . dronabinol (MARINOL) 5 MG capsule Take 1 capsule (5 mg total) by mouth 2 (two) times daily before a meal. 60 capsule 1  . HYDROcodone-acetaminophen (NORCO) 5-325 MG tablet Take 1-2 tablets by mouth every 6 (six) hours as needed for severe pain. 60 tablet 0  . lidocaine-prilocaine (EMLA) cream Apply 1 application topically as needed. Apply to portacath 1 1/2 hours - 2 hours prior to procedures as needed. 30 g 1  . meclizine (ANTIVERT) 50 MG tablet Take 1 tablet (50 mg total) by mouth 3 (three) times daily as needed. 30 tablet 0  . Menaquinone-7 (VITAMIN K2) 40 MCG TABS Take by mouth.    . mometasone (ELOCON) 0.1 % lotion Apply topically daily. 60 mL 0  . omeprazole (PRILOSEC) 40 MG capsule Take 1 capsule (40 mg total) by mouth 2 (two) times daily before a meal. 60 capsule 11  . ondansetron (ZOFRAN) 8 MG tablet Take 1 tablet (8 mg total) by mouth every 8 (eight) hours as needed for nausea or vomiting. 20 tablet 1  . Prenatal Vit-Fe Fumarate-FA (PRENATAL MULTIVITAMIN) TABS tablet Take 2 tablets by mouth daily at 12 noon.    . triamcinolone cream (KENALOG) 0.1 % Apply topically.    Marland Kitchen VITAMIN  K PO Take 1 capsule by mouth daily.     No current facility-administered medications for this visit.    Facility-Administered Medications Ordered in Other Visits  Medication Dose Route Frequency Provider Last Rate Last Dose  . alteplase (CATHFLO ACTIVASE) injection 2 mg  2 mg Intracatheter Once PRN Truitt Merle, MD      . heparin lock flush 100 unit/mL  250 Units Intracatheter Once PRN Truitt Merle, MD      . heparin lock flush 100 unit/mL  500 Units Intracatheter Once PRN Truitt Merle, MD      . sodium chloride flush (NS) 0.9 % injection 10 mL  10 mL Intracatheter PRN Truitt Merle, MD   10 mL at 09/09/16 1645  . sodium chloride flush (NS) 0.9 % injection 10 mL  10 mL Intravenous PRN Truitt Merle, MD   10 mL at 02/11/17 0809  . sodium chloride flush (NS) 0.9 % injection 10 mL  10 mL Intracatheter PRN Truitt Merle, MD        REVIEW OF SYSTEMS: Constitutional: Denies fevers, chills or abnormal night sweats  Eyes: Denies blurriness of vision, double vision or watery eyes Ears, nose, mouth, throat, and face: Denies mucositis or sore throat Respiratory: Denies cough, dyspnea or wheezes  Cardiovascular: Denies palpitation, chest discomfort or lower extremity swelling Gastrointestinal:  Denies heartburn or change in bowel habits  Skin: Denies abnormal skin rashes  Lymphatics: Denies new lymphadenopathy or easy bruising Neurological:Denies numbness, tingling or new weaknesses,  Behavioral/Psych: Mood is stable, no new changes  Musculoskeletal:  All other systems were reviewed with the patient and are negative.  PHYSICAL EXAMINATION:  ECOG PERFORMANCE STATUS: 1 - Symptomatic but completely ambulatory  Blood pressure 116/78, heart rate 81, respiratory rate 16, temperature 36.9, pulse ox 100% on room air GENERAL:alert, no distress and comfortable SKIN: skin color, texture, turgor are normal, no rashes or significant lesions, several hematomas on extremities  EYES: normal, conjunctiva are pink and non-injected,  sclera clear OROPHARYNX:no exudate, no erythema and lips, buccal mucosa NECK:  supple, thyroid normal size, non-tender, without nodularity LYMPH:  no palpable lymphadenopathy in the cervical, axillary or inguinal LUNGS: clear to auscultation and percussion with normal breathing effort HEART: regular rate & rhythm and no murmurs and no lower extremity edema ABDOMEN:abdomen soft, non-tender and normal bowel sounds Musculoskeletal:no cyanosis of digits and no clubbing  PSYCH: alert & oriented x 3 with fluent speech NEURO: no focal motor/sensory deficits  LABORATORY DATA:  I have reviewed the data as listed CBC Latest Ref Rng & Units 02/11/2017 02/02/2017 01/26/2017  WBC 3.9 - 10.3 10e3/uL 4.8 3.2(L) 3.2(L)  Hemoglobin 11.6 - 15.9 g/dL 10.6(L) 9.9(L) 10.7(L)  Hematocrit 34.8 - 46.6 % 31.3(L) 30.3(L) 32.1(L)  Platelets 145 - 400 10e3/uL 157 183 320   CMP Latest Ref Rng & Units 02/11/2017 02/02/2017 01/26/2017  Glucose 70 - 140 mg/dl 82 93 88  BUN 7.0 - 26.0 mg/dL 15.1 14.7 12.8  Creatinine 0.6 - 1.1 mg/dL 0.8 0.8 0.7  Sodium 136 - 145 mEq/L 140 140 141  Potassium 3.5 - 5.1 mEq/L 4.3 4.4 4.1  Chloride 101 - 111 mmol/L - - -  CO2 22 - 29 mEq/L 25 25 25   Calcium 8.4 - 10.4 mg/dL 9.1 9.2 9.1  Total Protein 6.4 - 8.3 g/dL 6.6 6.6 6.3(L)  Total Bilirubin 0.20 - 1.20 mg/dL 0.31 <0.22 0.22  Alkaline Phos 40 - 150 U/L 41 49 46  AST 5 - 34 U/L 23 16 18   ALT 0 - 55 U/L 25 14 15    CA19.9 (0-35U/ml) 07/28/2016: 36 08/18/2016: 51 09/13/2016: 56 10/19/2016: 43 11/09/2016: 44 12/06/2016: 41 01/05/2017: 35 02/02/2017: 37   PATHOLOGY REPORT  Diagnosis 07/07/2016 FINE NEEDLE ASPIRATION, ENDOSCOPIC, LIVER (SPECIMEN 2 OF 2 COLLECTED 07/07/16): MALIGNANT CELLS CONSISTENT WITH ADENOCARCINOMA.  ADDITIONAL INFORMATION: Per request, immunohistochemistry was performed. The cells are positive for cytokeratin 7 and negative for cytokeratin 20 and CDX-2. This excludes a colorectal primary. Pancreaticobiliary and upper  GI sources remain in the differential. Called to Dr. Burr Medico on 07/28/2016.  Diagnosis 07/07/2016 FINE NEEDLE ASPIRATION, ENDOSCOPIC, GASTRO-HEPATIC LYMPH NODE (SPECIMEN 1 OF 2 COLLECTED 07/07/16): MALIGNANT CELLS CONSISTENT WITH ADENOCARCINOMA, SEE COMMENT. Preliminary Diagnosis Intraoperative Diagnosis: Few atypical glandular clusters - recommend additional material. 4) Adequate ([facility])  RADIOGRAPHIC STUDIES: I have personally reviewed the radiological images as listed and agreed with the findings in the report.  CT CAP W Contrast 01/25/17 IMPRESSION: 1. Response to therapy. 2. Similar to minimal decrease in pulmonary nodules/metastasis. 3. Improvement in hepatic disease burden. 4. Decrease in abdominal adenopathy. 5. Significantly age advanced atherosclerosis, including within the coronary arteries. 6. Uterine fibroids. 7. Suspect a complex cyst in the left ovary. Recommend attention on follow-up.  CT CAP W CONTRAST 10/18/16 IMPRESSION: 1. Again noted are multifocal liver metastasis. Overall there has been no significant interval change in overall volume of liver metastases. 2. Similar appearance of upper abdominal adenopathy 3. Slight decrease in size of small pulmonary metastases.  Upper EUS 07/07/2016 Dr. Ardis Hughs  - Endoscopically normal UGI tract (good views with standard gastroscope) - Vaguely bordered 4cm mass in the caudate lobe of liver that is confluent with what appear to be enlarged gastrohepatic lymphnodes (these may represent direct tumor extension however). Preliminary cytology from the lymphnodes shows malignancy, glandular. Preliminary cytology from the liver mass shows the same. Await final cytology results but this is most suspicious for peripheral intrahepatic cholangiocarcinoma with adjacent malignant adenopathy.  COLONOSCOPY 05/22/2014 ENDOSCOPIC IMPRESSION: 1. Sessile polyp measuring 4 mm in size was found in the sigmoid colon;  polypectomy was performed with a  cold snare 2. The colon mucosa was otherwise normal  ASSESSMENT & PLAN:  51 y.o. African-American female, without significant past medical history, presented with abdominal pain  1. Metastatic intrahepatic cholangiocarcinoma, with probable node and lung metastasis, [id number], stage IV -I previously reviewed her CT, MRI, endoscopy findings and her biopsy results with patient and her family members in details. -We have previously reviewed her case in our GI tumor board. Based on the scans and endoscopy findings, this is most consistent with cholangiocarcinoma. Her liver and inguinal biopsy showed adenocarcinoma, IHC was consistent with pancreatic-biliary primary -She has 2 additional small liver lesions and 2 4-54mm lung lesions, which are indeterminate but that is suspicious for metastatic disease. -She was seen by surgeon Dr. Barry Dienes who thinks she is a not a candidate for surgical resection due to her metastatic disease. -We previously discussed her cancer is incurable at this stage, the goal of therapy is palliation, to prolong life and improve her quality life -she is on first-line cisplatin and gemcitabine, on day 1, 8 every 21 days.  -She has been tolerating chemotherapy moderately well, but had significant neutropenia and thrombocytopenia, cycle 2 chemotherapy was dose reduced -I previously reviewed her restaging CT scan from 10/19/2016, which showed stable disease overall, no other new lesions. She is clinically doing well, her abdominal pain has much improved. -CT from 5/29 reviewed with pt and it shows excellent partical response to treatment, both liver lesions and adenopathy have decreased in size, small lung nodules are stable, no new lesions.  -We'll continue chemotherapy with cisplatin and gemcitabine at current dose, with neulasta on day 9  - Previous cycle held due to infection. Lab from today reviewed with patient, adequate for treatment, we'll proceed C9D1 today -next restaging scan  in late August   2. Abdominal pain, improving -continue hydrocodone/acetaminophen every 4-6 hours as needed for pain control  -Her pain has nearly resolved after she started chemotherapy, she noticed mild left-sided pain in the past few days, -Constipation and management previously reviewed with patient -I have previously recommended her to try TUMS  3. Nausea and anorexia and weight loss  -We previously reviewed her nausea management, she will continue Compazine and Zofran -I previously encouraged her to take a nutritional supplement -We previously discussed appetite stimulant, she is on Marinol, dose was increased to 5 mg twice daily last week, appetite improved, will continue. -she will f/u with dietician  -Previously encouraged her to have a high calorie, high protein diet.  4. Neutropenia and anemia -Secondary to chemotherapy, neutropenia much improved with neulasta. -She is taking pre-natal vitamins that contain B-12 which is helpful -Continue monitoring  5. Goal of care discussion  -We previously discussed the incurable nature of her cancer, and the overall poor prognosis, especially if she does not have good response to chemotherapy or progress on chemo -The patient understands the goal of care is palliative. -I have previously recommended DNR/DNI, she will think about it.   Plan -Lab reviewed, adequate for treatment, we'll proceed with cycle 9 day 1 cisplatin and gemcitabine today -schedule upcoming lab, flush and treatments on 6/26, 7/9 and 7/16  -I will see her on 7/9 before cycle 10    All questions were answered. The patient knows to call the clinic with any problems, questions or concerns.  I spent 20 minutes counseling the patient face to face. The total time spent in the appointment was 25 minutes and more than 50% was on counseling.  This document serves as a record of services personally performed by Truitt Merle, MD. It was created on her behalf by Brandt Loosen, a  trained medical scribe. The creation of this record is based on the scribe's personal observations and the provider's statements to them. This document has been checked and approved by the attending provider.   I have reviewed the above documentation for accuracy and completeness and I agree with the above.   Truitt Merle, MD 02/11/2017

## 2017-02-11 ENCOUNTER — Other Ambulatory Visit (HOSPITAL_BASED_OUTPATIENT_CLINIC_OR_DEPARTMENT_OTHER): Payer: Federal, State, Local not specified - PPO

## 2017-02-11 ENCOUNTER — Ambulatory Visit (HOSPITAL_BASED_OUTPATIENT_CLINIC_OR_DEPARTMENT_OTHER): Payer: Federal, State, Local not specified - PPO | Admitting: Hematology

## 2017-02-11 ENCOUNTER — Ambulatory Visit: Payer: Federal, State, Local not specified - PPO

## 2017-02-11 ENCOUNTER — Encounter: Payer: Self-pay | Admitting: Hematology

## 2017-02-11 ENCOUNTER — Telehealth: Payer: Self-pay | Admitting: Hematology

## 2017-02-11 ENCOUNTER — Ambulatory Visit (HOSPITAL_BASED_OUTPATIENT_CLINIC_OR_DEPARTMENT_OTHER): Payer: Federal, State, Local not specified - PPO

## 2017-02-11 VITALS — BP 116/78 | HR 81 | Temp 98.5°F | Resp 16

## 2017-02-11 DIAGNOSIS — T451X5A Adverse effect of antineoplastic and immunosuppressive drugs, initial encounter: Secondary | ICD-10-CM

## 2017-02-11 DIAGNOSIS — C221 Intrahepatic bile duct carcinoma: Secondary | ICD-10-CM

## 2017-02-11 DIAGNOSIS — D701 Agranulocytosis secondary to cancer chemotherapy: Secondary | ICD-10-CM

## 2017-02-11 DIAGNOSIS — R110 Nausea: Secondary | ICD-10-CM | POA: Diagnosis not present

## 2017-02-11 DIAGNOSIS — R634 Abnormal weight loss: Secondary | ICD-10-CM | POA: Diagnosis not present

## 2017-02-11 DIAGNOSIS — Z5111 Encounter for antineoplastic chemotherapy: Secondary | ICD-10-CM

## 2017-02-11 DIAGNOSIS — C7801 Secondary malignant neoplasm of right lung: Principal | ICD-10-CM

## 2017-02-11 DIAGNOSIS — K769 Liver disease, unspecified: Secondary | ICD-10-CM | POA: Diagnosis not present

## 2017-02-11 DIAGNOSIS — Z7189 Other specified counseling: Secondary | ICD-10-CM

## 2017-02-11 DIAGNOSIS — D6481 Anemia due to antineoplastic chemotherapy: Secondary | ICD-10-CM | POA: Diagnosis not present

## 2017-02-11 DIAGNOSIS — R599 Enlarged lymph nodes, unspecified: Secondary | ICD-10-CM | POA: Diagnosis not present

## 2017-02-11 DIAGNOSIS — J984 Other disorders of lung: Secondary | ICD-10-CM | POA: Diagnosis not present

## 2017-02-11 DIAGNOSIS — Z95828 Presence of other vascular implants and grafts: Secondary | ICD-10-CM

## 2017-02-11 DIAGNOSIS — R630 Anorexia: Secondary | ICD-10-CM

## 2017-02-11 DIAGNOSIS — R109 Unspecified abdominal pain: Secondary | ICD-10-CM | POA: Diagnosis not present

## 2017-02-11 LAB — CBC WITH DIFFERENTIAL/PLATELET
BASO%: 0.6 % (ref 0.0–2.0)
Basophils Absolute: 0 10*3/uL (ref 0.0–0.1)
EOS%: 1.4 % (ref 0.0–7.0)
Eosinophils Absolute: 0.1 10*3/uL (ref 0.0–0.5)
HCT: 31.3 % — ABNORMAL LOW (ref 34.8–46.6)
HGB: 10.6 g/dL — ABNORMAL LOW (ref 11.6–15.9)
LYMPH%: 27.4 % (ref 14.0–49.7)
MCH: 30.7 pg (ref 25.1–34.0)
MCHC: 33.8 g/dL (ref 31.5–36.0)
MCV: 90.8 fL (ref 79.5–101.0)
MONO#: 0.3 10*3/uL (ref 0.1–0.9)
MONO%: 5.9 % (ref 0.0–14.0)
NEUT#: 3.1 10*3/uL (ref 1.5–6.5)
NEUT%: 64.7 % (ref 38.4–76.8)
Platelets: 157 10*3/uL (ref 145–400)
RBC: 3.44 10*6/uL — ABNORMAL LOW (ref 3.70–5.45)
RDW: 14.3 % (ref 11.2–14.5)
WBC: 4.8 10*3/uL (ref 3.9–10.3)
lymph#: 1.3 10*3/uL (ref 0.9–3.3)

## 2017-02-11 LAB — COMPREHENSIVE METABOLIC PANEL
ALT: 25 U/L (ref 0–55)
AST: 23 U/L (ref 5–34)
Albumin: 3.9 g/dL (ref 3.5–5.0)
Alkaline Phosphatase: 41 U/L (ref 40–150)
Anion Gap: 8 mEq/L (ref 3–11)
BUN: 15.1 mg/dL (ref 7.0–26.0)
CO2: 25 mEq/L (ref 22–29)
Calcium: 9.1 mg/dL (ref 8.4–10.4)
Chloride: 107 mEq/L (ref 98–109)
Creatinine: 0.8 mg/dL (ref 0.6–1.1)
EGFR: >90 mL/min/{1.73_m2} (ref >90)
Glucose: 82 mg/dl (ref 70–140)
Potassium: 4.3 mEq/L (ref 3.5–5.1)
Sodium: 140 mEq/L (ref 136–145)
Total Bilirubin: 0.31 mg/dL (ref 0.20–1.20)
Total Protein: 6.6 g/dL (ref 6.4–8.3)

## 2017-02-11 LAB — MAGNESIUM: Magnesium: 2 mg/dl (ref 1.5–2.5)

## 2017-02-11 MED ORDER — PALONOSETRON HCL INJECTION 0.25 MG/5ML
INTRAVENOUS | Status: AC
  Filled 2017-02-11: qty 5

## 2017-02-11 MED ORDER — SODIUM CHLORIDE 0.9 % IV SOLN
Freq: Once | INTRAVENOUS | Status: AC
  Administered 2017-02-11: 11:00:00 via INTRAVENOUS
  Filled 2017-02-11: qty 5

## 2017-02-11 MED ORDER — SODIUM CHLORIDE 0.9 % IV SOLN
25.0000 mg/m2 | Freq: Once | INTRAVENOUS | Status: AC
  Administered 2017-02-11: 43 mg via INTRAVENOUS
  Filled 2017-02-11: qty 43

## 2017-02-11 MED ORDER — PALONOSETRON HCL INJECTION 0.25 MG/5ML
0.2500 mg | Freq: Once | INTRAVENOUS | Status: AC
  Administered 2017-02-11: 0.25 mg via INTRAVENOUS

## 2017-02-11 MED ORDER — GEMCITABINE HCL CHEMO INJECTION 1 GM/26.3ML: 900.0000 mg/m2 | Freq: Once | INTRAVENOUS | Status: DC

## 2017-02-11 MED ORDER — PALONOSETRON HCL INJECTION 0.25 MG/5ML: 0.2500 mg | Freq: Once | INTRAVENOUS | Status: DC

## 2017-02-11 MED ORDER — POTASSIUM CHLORIDE 2 MEQ/ML IV SOLN
Freq: Once | INTRAVENOUS | Status: AC
  Administered 2017-02-11: 09:00:00 via INTRAVENOUS
  Filled 2017-02-11: qty 10

## 2017-02-11 MED ORDER — SODIUM CHLORIDE 0.9 % IV SOLN: Freq: Once | INTRAVENOUS | Status: DC

## 2017-02-11 MED ORDER — HEPARIN SOD (PORK) LOCK FLUSH 100 UNIT/ML IV SOLN
500.0000 [IU] | Freq: Once | INTRAVENOUS | Status: AC | PRN
Start: 2017-02-11 — End: 2017-02-11
  Administered 2017-02-11: 500 [IU]
  Filled 2017-02-11: qty 5

## 2017-02-11 MED ORDER — SODIUM CHLORIDE 0.9 % IV SOLN: 25.0000 mg/m2 | Freq: Once | INTRAVENOUS | Status: DC

## 2017-02-11 MED ORDER — SODIUM CHLORIDE 0.9% FLUSH
10.0000 mL | INTRAVENOUS | Status: AC | PRN
  Administered 2017-02-11 (×2): 10 mL via INTRAVENOUS
  Filled 2017-02-11: qty 10

## 2017-02-11 MED ORDER — SODIUM CHLORIDE 0.9 % IV SOLN
Freq: Once | INTRAVENOUS | Status: AC
  Administered 2017-02-11: 09:00:00 via INTRAVENOUS

## 2017-02-11 MED ORDER — SODIUM CHLORIDE 0.9% FLUSH
10.0000 mL | INTRAVENOUS | Status: DC | PRN
  Filled 2017-02-11: qty 10

## 2017-02-11 MED ORDER — SODIUM CHLORIDE 0.9 % IV SOLN
900.0000 mg/m2 | Freq: Once | INTRAVENOUS | Status: AC
  Administered 2017-02-11: 1558 mg via INTRAVENOUS
  Filled 2017-02-11: qty 40.98

## 2017-02-11 MED ORDER — DEXTROSE-NACL 5-0.45 % IV SOLN
Freq: Once | INTRAVENOUS | Status: DC
  Filled 2017-02-11: qty 10

## 2017-02-11 NOTE — Patient Instructions (Signed)
[place] Discharge Instructions for Patients Receiving Chemotherapy  Today you received the following chemotherapy agents Cisplatin/Gemzar  To help prevent nausea and vomiting after your treatment, we encourage you to take your nausea medication   If you develop nausea and vomiting that is not controlled by your nausea medication, call the clinic.   BELOW ARE SYMPTOMS THAT SHOULD BE REPORTED IMMEDIATELY:  *FEVER GREATER THAN 100.5 F  *CHILLS WITH OR WITHOUT FEVER  NAUSEA AND VOMITING THAT IS NOT CONTROLLED WITH YOUR NAUSEA MEDICATION  *UNUSUAL SHORTNESS OF BREATH  *UNUSUAL BRUISING OR BLEEDING  TENDERNESS IN MOUTH AND THROAT WITH OR WITHOUT PRESENCE OF ULCERS  *URINARY PROBLEMS  *BOWEL PROBLEMS  UNUSUAL RASH Items with * indicate a potential emergency and should be followed up as soon as possible.  Feel free to call the clinic you have any questions or concerns. The clinic phone number is [phone].  Please show the [place] at check-in to the Emergency Department and triage nurse.

## 2017-02-11 NOTE — Patient Instructions (Signed)
Implanted Port Home Guide An implanted port is a type of central line that is placed under the skin. Central lines are used to provide IV access when treatment or nutrition needs to be given through a person's veins. Implanted ports are used for long-term IV access. An implanted port may be placed because:  You need IV medicine that would be irritating to the small veins in your hands or arms.  You need long-term IV medicines, such as antibiotics.  You need IV nutrition for a long period.  You need frequent blood draws for lab tests.  You need dialysis.  Implanted ports are usually placed in the chest area, but they can also be placed in the upper arm, the abdomen, or the leg. An implanted port has two main parts:  Reservoir. The reservoir is round and will appear as a small, raised area under your skin. The reservoir is the part where a needle is inserted to give medicines or draw blood.  Catheter. The catheter is a thin, flexible tube that extends from the reservoir. The catheter is placed into a large vein. Medicine that is inserted into the reservoir goes into the catheter and then into the vein.  How will I care for my incision site? Do not get the incision site wet. Bathe or shower as directed by your health care provider. How is my port accessed? Special steps must be taken to access the port:  Before the port is accessed, a numbing cream can be placed on the skin. This helps numb the skin over the port site.  Your health care provider uses a sterile technique to access the port. ? Your health care provider must put on a mask and sterile gloves. ? The skin over your port is cleaned carefully with an antiseptic and allowed to dry. ? The port is gently pinched between sterile gloves, and a needle is inserted into the port.  Only "non-coring" port needles should be used to access the port. Once the port is accessed, a blood return should be checked. This helps ensure that the port  is in the vein and is not clogged.  If your port needs to remain accessed for a constant infusion, a clear (transparent) bandage will be placed over the needle site. The bandage and needle will need to be changed every week, or as directed by your health care provider.  Keep the bandage covering the needle clean and dry. Do not get it wet. Follow your health care provider's instructions on how to take a shower or bath while the port is accessed.  If your port does not need to stay accessed, no bandage is needed over the port.  What is flushing? Flushing helps keep the port from getting clogged. Follow your health care provider's instructions on how and when to flush the port. Ports are usually flushed with saline solution or a medicine called heparin. The need for flushing will depend on how the port is used.  If the port is used for intermittent medicines or blood draws, the port will need to be flushed: ? After medicines have been given. ? After blood has been drawn. ? As part of routine maintenance.  If a constant infusion is running, the port may not need to be flushed.  How long will my port stay implanted? The port can stay in for as long as your health care provider thinks it is needed. When it is time for the port to come out, surgery will be   done to remove it. The procedure is similar to the one performed when the port was put in. When should I seek immediate medical care? When you have an implanted port, you should seek immediate medical care if:  You notice a bad smell coming from the incision site.  You have swelling, redness, or drainage at the incision site.  You have more swelling or pain at the port site or the surrounding area.  You have a fever that is not controlled with medicine.  This information is not intended to replace advice given to you by your health care provider. Make sure you discuss any questions you have with your health care provider. Document  Released: 08/16/2005 Document Revised: 01/22/2016 Document Reviewed: 04/23/2013 Elsevier Interactive Patient Education  2017 Else[employer] Inc.  

## 2017-02-11 NOTE — Telephone Encounter (Signed)
Scheduled apt per 6/15 los - Gave patient AVS and calender.

## 2017-02-13 ENCOUNTER — Encounter: Payer: Self-pay | Admitting: Hematology

## 2017-02-13 DIAGNOSIS — T451X5A Adverse effect of antineoplastic and immunosuppressive drugs, initial encounter: Secondary | ICD-10-CM | POA: Insufficient documentation

## 2017-02-13 DIAGNOSIS — D6481 Anemia due to antineoplastic chemotherapy: Secondary | ICD-10-CM | POA: Insufficient documentation

## 2017-02-14 ENCOUNTER — Ambulatory Visit: Payer: Federal, State, Local not specified - PPO | Admitting: Hematology

## 2017-02-14 ENCOUNTER — Other Ambulatory Visit: Payer: Federal, State, Local not specified - PPO

## 2017-02-14 ENCOUNTER — Ambulatory Visit: Payer: Federal, State, Local not specified - PPO

## 2017-02-21 ENCOUNTER — Ambulatory Visit (HOSPITAL_BASED_OUTPATIENT_CLINIC_OR_DEPARTMENT_OTHER): Payer: Federal, State, Local not specified - PPO

## 2017-02-21 ENCOUNTER — Other Ambulatory Visit (HOSPITAL_BASED_OUTPATIENT_CLINIC_OR_DEPARTMENT_OTHER): Payer: Federal, State, Local not specified - PPO

## 2017-02-21 ENCOUNTER — Ambulatory Visit: Payer: Federal, State, Local not specified - PPO

## 2017-02-21 DIAGNOSIS — C221 Intrahepatic bile duct carcinoma: Secondary | ICD-10-CM

## 2017-02-21 DIAGNOSIS — C7801 Secondary malignant neoplasm of right lung: Principal | ICD-10-CM

## 2017-02-21 DIAGNOSIS — Z5111 Encounter for antineoplastic chemotherapy: Secondary | ICD-10-CM

## 2017-02-21 LAB — COMPREHENSIVE METABOLIC PANEL
ALT: 17 U/L (ref 0–55)
AST: 18 U/L (ref 5–34)
Albumin: 3.9 g/dL (ref 3.5–5.0)
Alkaline Phosphatase: 37 U/L — ABNORMAL LOW (ref 40–150)
Anion Gap: 10 mEq/L (ref 3–11)
BUN: 11.1 mg/dL (ref 7.0–26.0)
CO2: 25 mEq/L (ref 22–29)
Calcium: 9.2 mg/dL (ref 8.4–10.4)
Chloride: 105 mEq/L (ref 98–109)
Creatinine: 0.7 mg/dL (ref 0.6–1.1)
EGFR: >90 mL/min/{1.73_m2} (ref >90)
Glucose: 89 mg/dl (ref 70–140)
Potassium: 4.1 mEq/L (ref 3.5–5.1)
Sodium: 140 mEq/L (ref 136–145)
Total Bilirubin: <0.22 mg/dL (ref 0.20–1.20)
Total Protein: 6.6 g/dL (ref 6.4–8.3)

## 2017-02-21 LAB — CBC WITH DIFFERENTIAL/PLATELET
BASO%: 0.6 % (ref 0.0–2.0)
Basophils Absolute: 0 10*3/uL (ref 0.0–0.1)
EOS%: 2 % (ref 0.0–7.0)
Eosinophils Absolute: 0.1 10*3/uL (ref 0.0–0.5)
HCT: 30.8 % — ABNORMAL LOW (ref 34.8–46.6)
HGB: 10.3 g/dL — ABNORMAL LOW (ref 11.6–15.9)
LYMPH%: 53.1 % — ABNORMAL HIGH (ref 14.0–49.7)
MCH: 29.8 pg (ref 25.1–34.0)
MCHC: 33.5 g/dL (ref 31.5–36.0)
MCV: 89.1 fL (ref 79.5–101.0)
MONO#: 0.2 10*3/uL (ref 0.1–0.9)
MONO%: 6.9 % (ref 0.0–14.0)
NEUT#: 1 10*3/uL — ABNORMAL LOW (ref 1.5–6.5)
NEUT%: 37.4 % — ABNORMAL LOW (ref 38.4–76.8)
Platelets: 122 10*3/uL — ABNORMAL LOW (ref 145–400)
RBC: 3.45 10*6/uL — ABNORMAL LOW (ref 3.70–5.45)
RDW: 14 % (ref 11.2–14.5)
WBC: 2.6 10*3/uL — ABNORMAL LOW (ref 3.9–10.3)
lymph#: 1.4 10*3/uL (ref 0.9–3.3)

## 2017-02-21 LAB — MAGNESIUM: Magnesium: 2.2 mg/dl (ref 1.5–2.5)

## 2017-02-21 MED ORDER — SODIUM CHLORIDE 0.9 % IV SOLN
25.0000 mg/m2 | Freq: Once | INTRAVENOUS | Status: AC
  Administered 2017-02-21: 43 mg via INTRAVENOUS
  Filled 2017-02-21: qty 43

## 2017-02-21 MED ORDER — POTASSIUM CHLORIDE 2 MEQ/ML IV SOLN
Freq: Once | INTRAVENOUS | Status: AC
  Administered 2017-02-21: 11:00:00 via INTRAVENOUS
  Filled 2017-02-21: qty 10

## 2017-02-21 MED ORDER — PALONOSETRON HCL INJECTION 0.25 MG/5ML
INTRAVENOUS | Status: AC
  Filled 2017-02-21: qty 5

## 2017-02-21 MED ORDER — SODIUM CHLORIDE 0.9 % IV SOLN
Freq: Once | INTRAVENOUS | Status: AC
  Administered 2017-02-21: 14:00:00 via INTRAVENOUS
  Filled 2017-02-21: qty 5

## 2017-02-21 MED ORDER — SODIUM CHLORIDE 0.9% FLUSH
10.0000 mL | INTRAVENOUS | Status: DC | PRN
  Administered 2017-02-21: 10 mL
  Filled 2017-02-21: qty 10

## 2017-02-21 MED ORDER — HEPARIN SOD (PORK) LOCK FLUSH 100 UNIT/ML IV SOLN
500.0000 [IU] | Freq: Once | INTRAVENOUS | Status: AC | PRN
  Administered 2017-02-21: 500 [IU]
  Filled 2017-02-21: qty 5

## 2017-02-21 MED ORDER — SODIUM CHLORIDE 0.9 % IV SOLN
900.0000 mg/m2 | Freq: Once | INTRAVENOUS | Status: AC
  Administered 2017-02-21: 1558 mg via INTRAVENOUS
  Filled 2017-02-21: qty 40.98

## 2017-02-21 MED ORDER — SODIUM CHLORIDE 0.9 % IV SOLN
Freq: Once | INTRAVENOUS | Status: AC
  Administered 2017-02-21: 11:00:00 via INTRAVENOUS

## 2017-02-21 MED ORDER — PALONOSETRON HCL INJECTION 0.25 MG/5ML
0.2500 mg | Freq: Once | INTRAVENOUS | Status: AC
  Administered 2017-02-21: 0.25 mg via INTRAVENOUS

## 2017-02-21 NOTE — Patient Instructions (Signed)
Implanted Port Home Guide An implanted port is a type of central line that is placed under the skin. Central lines are used to provide IV access when treatment or nutrition needs to be given through a person's veins. Implanted ports are used for long-term IV access. An implanted port may be placed because:  You need IV medicine that would be irritating to the small veins in your hands or arms.  You need long-term IV medicines, such as antibiotics.  You need IV nutrition for a long period.  You need frequent blood draws for lab tests.  You need dialysis.  Implanted ports are usually placed in the chest area, but they can also be placed in the upper arm, the abdomen, or the leg. An implanted port has two main parts:  Reservoir. The reservoir is round and will appear as a small, raised area under your skin. The reservoir is the part where a needle is inserted to give medicines or draw blood.  Catheter. The catheter is a thin, flexible tube that extends from the reservoir. The catheter is placed into a large vein. Medicine that is inserted into the reservoir goes into the catheter and then into the vein.  How will I care for my incision site? Do not get the incision site wet. Bathe or shower as directed by your health care provider. How is my port accessed? Special steps must be taken to access the port:  Before the port is accessed, a numbing cream can be placed on the skin. This helps numb the skin over the port site.  Your health care provider uses a sterile technique to access the port. ? Your health care provider must put on a mask and sterile gloves. ? The skin over your port is cleaned carefully with an antiseptic and allowed to dry. ? The port is gently pinched between sterile gloves, and a needle is inserted into the port.  Only "non-coring" port needles should be used to access the port. Once the port is accessed, a blood return should be checked. This helps ensure that the port  is in the vein and is not clogged.  If your port needs to remain accessed for a constant infusion, a clear (transparent) bandage will be placed over the needle site. The bandage and needle will need to be changed every week, or as directed by your health care provider.  Keep the bandage covering the needle clean and dry. Do not get it wet. Follow your health care provider's instructions on how to take a shower or bath while the port is accessed.  If your port does not need to stay accessed, no bandage is needed over the port.  What is flushing? Flushing helps keep the port from getting clogged. Follow your health care provider's instructions on how and when to flush the port. Ports are usually flushed with saline solution or a medicine called heparin. The need for flushing will depend on how the port is used.  If the port is used for intermittent medicines or blood draws, the port will need to be flushed: ? After medicines have been given. ? After blood has been drawn. ? As part of routine maintenance.  If a constant infusion is running, the port may not need to be flushed.  How long will my port stay implanted? The port can stay in for as long as your health care provider thinks it is needed. When it is time for the port to come out, surgery will be   done to remove it. The procedure is similar to the one performed when the port was put in. When should I seek immediate medical care? When you have an implanted port, you should seek immediate medical care if:  You notice a bad smell coming from the incision site.  You have swelling, redness, or drainage at the incision site.  You have more swelling or pain at the port site or the surrounding area.  You have a fever that is not controlled with medicine.  This information is not intended to replace advice given to you by your health care provider. Make sure you discuss any questions you have with your health care provider. Document  Released: 08/16/2005 Document Revised: 01/22/2016 Document Reviewed: 04/23/2013 Elsevier Interactive Patient Education  2017 Else[employer] Inc.  

## 2017-02-21 NOTE — Progress Notes (Unsigned)
OK to treat today for Day 8 treatment per Dr Benay Spice.  Pt to receive neulasta tomorrow.  Neutropenic precautions discussed.

## 2017-02-21 NOTE — Patient Instructions (Signed)
[place] Discharge Instructions for Patients Receiving Chemotherapy  Today you received the following chemotherapy agents:  Gemcitabine & Cisplatin  To help prevent nausea and vomiting after your treatment, we encourage you to take your nausea medication.   If you develop nausea and vomiting that is not controlled by your nausea medication, call the clinic.   BELOW ARE SYMPTOMS THAT SHOULD BE REPORTED IMMEDIATELY:  *FEVER GREATER THAN 100.5 F  *CHILLS WITH OR WITHOUT FEVER  NAUSEA AND VOMITING THAT IS NOT CONTROLLED WITH YOUR NAUSEA MEDICATION  *UNUSUAL SHORTNESS OF BREATH  *UNUSUAL BRUISING OR BLEEDING  TENDERNESS IN MOUTH AND THROAT WITH OR WITHOUT PRESENCE OF ULCERS  *URINARY PROBLEMS  *BOWEL PROBLEMS  UNUSUAL RASH Items with * indicate a potential emergency and should be followed up as soon as possible.  Feel free to call the clinic you have any questions or concerns. The clinic phone number is [phone].  Please show the [place] at check-in to the Emergency Department and triage nurse.  Come @ 4:30 pm tomorrow for neulasta Call for any symptoms of infection.

## 2017-02-22 ENCOUNTER — Telehealth: Payer: Self-pay | Admitting: *Deleted

## 2017-02-22 ENCOUNTER — Ambulatory Visit (HOSPITAL_BASED_OUTPATIENT_CLINIC_OR_DEPARTMENT_OTHER): Payer: Federal, State, Local not specified - PPO

## 2017-02-22 VITALS — BP 135/80 | HR 80 | Temp 98.7°F | Resp 20

## 2017-02-22 DIAGNOSIS — Z5189 Encounter for other specified aftercare: Secondary | ICD-10-CM | POA: Diagnosis not present

## 2017-02-22 DIAGNOSIS — C221 Intrahepatic bile duct carcinoma: Secondary | ICD-10-CM

## 2017-02-22 DIAGNOSIS — C7801 Secondary malignant neoplasm of right lung: Secondary | ICD-10-CM

## 2017-02-22 MED ORDER — PEGFILGRASTIM INJECTION 6 MG/0.6ML ~~LOC~~
6.0000 mg | PREFILLED_SYRINGE | Freq: Once | SUBCUTANEOUS | Status: AC
  Administered 2017-02-22: 6 mg via SUBCUTANEOUS
  Filled 2017-02-22: qty 0.6

## 2017-02-22 NOTE — Patient Instructions (Signed)
Pegfilgrastim injection What is this medicine? PEGFILGRASTIM (PEG fil gra stim) is a long-acting granulocyte colony-stimulating factor that stimulates the growth of neutrophils, a type of white blood cell important in the body's fight against infection. It is used to reduce the incidence of fever and infection in patients with certain types of cancer who are receiving chemotherapy that affects the bone marrow, and to increase survival after being exposed to high doses of radiation. This medicine may be used for other purposes; ask your health care provider or pharmacist if you have questions. COMMON BRAND NAME(S): Neulasta What should I tell my health care provider before I take this medicine? They need to know if you have any of these conditions: -kidney disease -latex allergy -ongoing radiation therapy -sickle cell disease -skin reactions to acrylic adhesives (On-Body Injector only) -an unusual or allergic reaction to pegfilgrastim, filgrastim, other medicines, foods, dyes, or preservatives -pregnant or trying to get pregnant -breast-feeding How should I use this medicine? This medicine is for injection under the skin. If you get this medicine at home, you will be taught how to prepare and give the pre-filled syringe or how to use the On-body Injector. Refer to the patient Instructions for Use for detailed instructions. Use exactly as directed. Tell your healthcare provider immediately if you suspect that the On-body Injector may not have performed as intended or if you suspect the use of the On-body Injector resulted in a missed or partial dose. It is important that you put your used needles and syringes in a special sharps container. Do not put them in a trash can. If you do not have a sharps container, call your pharmacist or healthcare provider to get one. Talk to your pediatrician regarding the use of this medicine in children. While this drug may be prescribed for selected conditions,  precautions do apply. Overdosage: If you think you have taken too much of this medicine contact a poison control center or emergency room at once. NOTE: This medicine is only for you. Do not share this medicine with others. What if I miss a dose? It is important not to miss your dose. Call your doctor or health care professional if you miss your dose. If you miss a dose due to an On-body Injector failure or leakage, a new dose should be administered as soon as possible using a single prefilled syringe for manual use. What may interact with this medicine? Interactions have not been studied. Give your health care provider a list of all the medicines, herbs, non-prescription drugs, or dietary supplements you use. Also tell them if you smoke, drink alcohol, or use illegal drugs. Some items may interact with your medicine. This list may not describe all possible interactions. Give your health care provider a list of all the medicines, herbs, non-prescription drugs, or dietary supplements you use. Also tell them if you smoke, drink alcohol, or use illegal drugs. Some items may interact with your medicine. What should I watch for while using this medicine? You may need blood work done while you are taking this medicine. If you are going to need a MRI, CT scan, or other procedure, tell your doctor that you are using this medicine (On-Body Injector only). What side effects may I notice from receiving this medicine? Side effects that you should report to your doctor or health care professional as soon as possible: -allergic reactions like skin rash, itching or hives, swelling of the face, lips, or tongue -dizziness -fever -pain, redness, or irritation at site   where injected -pinpoint red spots on the skin -red or dark-brown urine -shortness of breath or breathing problems -stomach or side pain, or pain at the shoulder -swelling -tiredness -trouble passing urine or change in the amount of urine Side  effects that usually do not require medical attention (report to your doctor or health care professional if they continue or are bothersome): -bone pain -muscle pain This list may not describe all possible side effects. Call your doctor for medical advice about side effects. You may report side effects to FDA at 1-800-FDA-1088. Where should I keep my medicine? Keep out of the reach of children. Store pre-filled syringes in a refrigerator between 2 and 8 degrees C (36 and 46 degrees F). Do not freeze. Keep in carton to protect from light. Throw away this medicine if it is left out of the refrigerator for more than 48 hours. Throw away any unused medicine after the expiration date. NOTE: This sheet is a summary. It may not cover all possible information. If you have questions about this medicine, talk to your doctor, pharmacist, or health care provider.  2018 Elsevier/Gold Standard (2016-08-12 12:58:03)  

## 2017-02-22 NOTE — Telephone Encounter (Signed)
"  I need a scheduler to coordinate today's appointment."  Injection today at 4:15 pm.    "Oh great it's been changed.  That's all I need to know."

## 2017-03-07 ENCOUNTER — Ambulatory Visit (HOSPITAL_BASED_OUTPATIENT_CLINIC_OR_DEPARTMENT_OTHER): Payer: Federal, State, Local not specified - PPO | Admitting: Adult Health

## 2017-03-07 ENCOUNTER — Other Ambulatory Visit (HOSPITAL_BASED_OUTPATIENT_CLINIC_OR_DEPARTMENT_OTHER): Payer: Federal, State, Local not specified - PPO

## 2017-03-07 ENCOUNTER — Ambulatory Visit (HOSPITAL_BASED_OUTPATIENT_CLINIC_OR_DEPARTMENT_OTHER): Payer: Federal, State, Local not specified - PPO

## 2017-03-07 ENCOUNTER — Encounter: Payer: Self-pay | Admitting: Adult Health

## 2017-03-07 ENCOUNTER — Ambulatory Visit: Payer: Federal, State, Local not specified - PPO

## 2017-03-07 VITALS — BP 135/73 | HR 64 | Temp 97.8°F | Resp 17 | Ht 66.0 in | Wt 139.7 lb

## 2017-03-07 DIAGNOSIS — C221 Intrahepatic bile duct carcinoma: Secondary | ICD-10-CM

## 2017-03-07 DIAGNOSIS — N951 Menopausal and female climacteric states: Secondary | ICD-10-CM | POA: Diagnosis not present

## 2017-03-07 DIAGNOSIS — Z5111 Encounter for antineoplastic chemotherapy: Secondary | ICD-10-CM

## 2017-03-07 DIAGNOSIS — R202 Paresthesia of skin: Secondary | ICD-10-CM | POA: Diagnosis not present

## 2017-03-07 DIAGNOSIS — C7801 Secondary malignant neoplasm of right lung: Principal | ICD-10-CM

## 2017-03-07 LAB — MAGNESIUM: Magnesium: 2.3 mg/dl (ref 1.5–2.5)

## 2017-03-07 LAB — COMPREHENSIVE METABOLIC PANEL
ALT: 15 U/L (ref 0–55)
AST: 17 U/L (ref 5–34)
Albumin: 3.9 g/dL (ref 3.5–5.0)
Alkaline Phosphatase: 55 U/L (ref 40–150)
Anion Gap: 9 mEq/L (ref 3–11)
BUN: 10.6 mg/dL (ref 7.0–26.0)
CO2: 26 mEq/L (ref 22–29)
Calcium: 9.1 mg/dL (ref 8.4–10.4)
Chloride: 107 mEq/L (ref 98–109)
Creatinine: 0.7 mg/dL (ref 0.6–1.1)
EGFR: >90 mL/min/{1.73_m2} (ref >90)
Glucose: 93 mg/dl (ref 70–140)
Potassium: 4.2 mEq/L (ref 3.5–5.1)
Sodium: 142 mEq/L (ref 136–145)
Total Bilirubin: <0.22 mg/dL (ref 0.20–1.20)
Total Protein: 6.5 g/dL (ref 6.4–8.3)

## 2017-03-07 LAB — CBC WITH DIFFERENTIAL/PLATELET
BASO%: 0.4 % (ref 0.0–2.0)
Basophils Absolute: 0 10*3/uL (ref 0.0–0.1)
EOS%: 1.4 % (ref 0.0–7.0)
Eosinophils Absolute: 0.1 10*3/uL (ref 0.0–0.5)
HCT: 32.4 % — ABNORMAL LOW (ref 34.8–46.6)
HGB: 10.8 g/dL — ABNORMAL LOW (ref 11.6–15.9)
LYMPH%: 31.6 % (ref 14.0–49.7)
MCH: 29.7 pg (ref 25.1–34.0)
MCHC: 33.2 g/dL (ref 31.5–36.0)
MCV: 89.4 fL (ref 79.5–101.0)
MONO#: 0.4 10*3/uL (ref 0.1–0.9)
MONO%: 10.3 % (ref 0.0–14.0)
NEUT#: 2.4 10*3/uL (ref 1.5–6.5)
NEUT%: 56.3 % (ref 38.4–76.8)
Platelets: 157 10*3/uL (ref 145–400)
RBC: 3.63 10*6/uL — ABNORMAL LOW (ref 3.70–5.45)
RDW: 14.8 % — ABNORMAL HIGH (ref 11.2–14.5)
WBC: 4.2 10*3/uL (ref 3.9–10.3)
lymph#: 1.3 10*3/uL (ref 0.9–3.3)

## 2017-03-07 MED ORDER — POTASSIUM CHLORIDE 2 MEQ/ML IV SOLN
Freq: Once | INTRAVENOUS | Status: AC
  Administered 2017-03-07: 10:00:00 via INTRAVENOUS
  Filled 2017-03-07: qty 10

## 2017-03-07 MED ORDER — PALONOSETRON HCL INJECTION 0.25 MG/5ML
INTRAVENOUS | Status: AC
  Filled 2017-03-07: qty 5

## 2017-03-07 MED ORDER — SODIUM CHLORIDE 0.9 % IV SOLN
900.0000 mg/m2 | Freq: Once | INTRAVENOUS | Status: AC
  Administered 2017-03-07: 1558 mg via INTRAVENOUS
  Filled 2017-03-07: qty 40.98

## 2017-03-07 MED ORDER — SODIUM CHLORIDE 0.9 % IV SOLN
Freq: Once | INTRAVENOUS | Status: AC
Start: 2017-03-07 — End: 2017-03-07
  Administered 2017-03-07: 10:00:00 via INTRAVENOUS

## 2017-03-07 MED ORDER — HEPARIN SOD (PORK) LOCK FLUSH 100 UNIT/ML IV SOLN
500.0000 [IU] | Freq: Once | INTRAVENOUS | Status: AC | PRN
  Administered 2017-03-07: 500 [IU]
  Filled 2017-03-07: qty 5

## 2017-03-07 MED ORDER — SODIUM CHLORIDE 0.9 % IV SOLN
25.0000 mg/m2 | Freq: Once | INTRAVENOUS | Status: AC
  Administered 2017-03-07: 43 mg via INTRAVENOUS
  Filled 2017-03-07: qty 43

## 2017-03-07 MED ORDER — SODIUM CHLORIDE 0.9% FLUSH
10.0000 mL | INTRAVENOUS | Status: DC | PRN
  Administered 2017-03-07: 10 mL
  Filled 2017-03-07: qty 10

## 2017-03-07 MED ORDER — PALONOSETRON HCL INJECTION 0.25 MG/5ML
0.2500 mg | Freq: Once | INTRAVENOUS | Status: AC
  Administered 2017-03-07: 0.25 mg via INTRAVENOUS

## 2017-03-07 MED ORDER — SODIUM CHLORIDE 0.9 % IV SOLN
Freq: Once | INTRAVENOUS | Status: AC
  Administered 2017-03-07: 12:00:00 via INTRAVENOUS
  Filled 2017-03-07: qty 5

## 2017-03-07 NOTE — Progress Notes (Signed)
[facility] Follow up:    Nancy Held, DO [address] [place] [address]   DIAGNOSIS: Cancer Staging Metastatic cholangiocarcinoma to lung [facility]) Staging form: Intrahepatic Bile Duct, AJCC 7th Edition - Clinical stage from 07/07/2016: Stage IVB (T2b, N1, M1) - Signed by Truitt Merle, MD on 07/28/2016   SUMMARY OF ONCOLOGIC HISTORY: Oncology History   Metastatic cholangiocarcinoma to lung [facility])   Staging form: Perihilar Bile Ducts, AJCC 7th Edition   - Clinical stage from 07/07/2016: Stage IVB (T2b, N1, M1) - Signed by Truitt Merle, MD on 07/28/2016      Metastatic cholangiocarcinoma to lung ([place])   05/22/2014 Procedure    Routine screening colonoscopy showed a sessile polyp measuring 4 mm in the sigmoid colon, removed, otherwise negative.      06/29/2016 Imaging    CT chest, abdomen and pelvis showed 2 indeterminant nodule in left and the right lung, 4-31mm, indeterminate a hypovascular liver lesions largest in the caudate lobe measuring 2.6 cm, mild abdominal lymphadenopathy in the gastrohepatic ligament and portocaval space.      07/02/2016 Imaging    Abdominal MRI with and without contrast showed 2 lobular lesions in the caudate lobe, and is regular lymph nodes in the gastrohepatic ligament which is adjacent to the liver lesion, suspicious for malignancy. 2 small lesions in the right hepatic lobe are indeterminate, but concerning for metastasis.      07/07/2016 Procedure    EGD was negative, EUS biopsy of the liver and adjacent lymph node      07/07/2016 Initial Diagnosis    Metastatic cholangiocarcinoma to lung ([place])      07/07/2016 Initial Biopsy    Fine-needle aspiration of the liver lesion in caudate lobe and adjacent lymph nodes both showed metastatic adenocarcinoma.      08/11/2016 -  Chemotherapy    Cisplatin 25 mg/m, gemcitabine 1000 mg/m, on day one and 8, every 21 days        10/18/2016 Imaging    CT CAP   IMPRESSION: 1. Again noted are multifocal liver metastasis. Overall there has been no significant interval change in overall volume of liver metastases. 2. Similar appearance of upper abdominal adenopathy 3. Slight decrease in size of small pulmonary metastases.      01/25/2017 Imaging    CT CAP W Contrast 01/25/17 IMPRESSION: 1. Response to therapy. 2. Similar to minimal decrease in pulmonary nodules/metastasis. 3. Improvement in hepatic disease burden. 4. Decrease in abdominal adenopathy. 5. Significantly age advanced atherosclerosis, including within the coronary arteries. 6. Uterine fibroids. 7. Suspect a complex cyst in the left ovary. Recommend attention on follow-up.        CURRENT THERAPY: Cisplatin/Gemcitabine cycle 10 day 1  INTERVAL HISTORY: Nancy [name] 51 y.o. female returns for evaluation of her metastatic cholangiocarcinoma.  She is here prior to receiving chemotherapy with Cisplatin/Gemcitabine given on days 1 and 8 of a 21 day cycle.  She is doing well today.  She has a couple of concerns about her chemotherapy.  She is concerned that her hair is growing back slightly over the past couple of weeks.  She has also noted a feeling of a "baking sensation" in her lower lungs/mid abdomen that isn't considered pain, just an odd sensation.  She also notes about one hot flash per week.     Patient Active Problem List   Diagnosis Date Noted  . Anemia due to antineoplastic chemotherapy 02/13/2017  . Goals of care,  counseling/discussion 10/04/2016  . Cerumen impaction 09/16/2016  . Abnormal finding on GI tract imaging   . Metastatic cholangiocarcinoma to lung ([facility])   . Acute bronchitis 10/01/2015  . Vestibular migraine 06/20/2015  . TIA (transient ischemic attack) 06/12/2015  . Hyperlipidemia LDL goal <70 06/12/2015  . Diplopia 06/09/2015  . Vertigo 06/09/2015  . Right leg pain 04/12/2014  . Hyperthyroidism 03/13/2014  . MAMMOGRAM, ABNORMAL 10/31/2008  . ECZEMA,  ATOPIC 10/15/2008  . FIBROCYSTIC BREAST DISEASE, HX OF 10/15/2008    is allergic to penicillins and lipitor [atorvastatin].  MEDICAL HISTORY: Past Medical History:  Diagnosis Date  . Allergy    SEASONAL  . Anemia    HIGH SCHOOL  . Atopic eczema   . cholangio ca dx'd 07/12/16  . Fibrocystic breast disease   . GERD (gastroesophageal reflux disease)   . Hx of migraines    seasonal   . Pneumonia    6 years ago   . TIA (transient ischemic attack)     SURGICAL HISTORY: Past Surgical History:  Procedure Laterality Date  . EUS N/A 07/07/2016   Procedure: UPPER ENDOSCOPIC ULTRASOUND (EUS) RADIAL;  Surgeon: [name], MD;  Location: [facility];  Service: Endoscopy;  Laterality: N/A;  . IR GENERIC HISTORICAL  08/03/2016   IR US GUIDE VASC ACCESS RIGHT 08/03/2016 Corrie Mckusick, DO [facility]INTER[facility]  . IR GENERIC HISTORICAL  08/03/2016   IR FLUORO GUIDE PORT INSERTION RIGHT 08/03/2016 Corrie Mckusick, DO [facility]  . MYOMECTOMY  2002    SOCIAL HISTORY: Social History   Social History  . Marital status: Married    Spouse name: N/A  . Number of children: 0  . Years of education: 62   Occupational History  . [employer]  .  [employer]   Social History Main Topics  . Smoking status: Never Smoker  . Smokeless tobacco: Never Used  . Alcohol use Yes     Comment: maybe 3 times a year  . Drug use: No  . Sexual activity: Yes    Partners: Male   Other Topics Concern  . Not on file   Social History Narrative  . No narrative on file    FAMILY HISTORY: Family History  Problem Relation Age of Onset  . Gout Mother   . Diabetes Mother   . Hypertension Father   . Diabetes Father   . Breast cancer Paternal Aunt   . Cancer Maternal Grandfather        throat cancer   . Cancer Cousin 60       GI cancer   . Thyroid disease Neg Hx     Review of Systems  Constitutional: Negative for appetite change, chills, fatigue and fever.  HENT:   Negative for hearing loss and  lump/mass.   Eyes: Negative for eye problems and icterus.  Respiratory: Negative for chest tightness, cough and shortness of breath.   Cardiovascular: Negative for chest pain, leg swelling and palpitations.  Gastrointestinal: Negative for abdominal distention, abdominal pain, constipation, diarrhea, nausea and vomiting.  Endocrine: Positive for hot flashes.  Genitourinary: Negative for difficulty urinating.   Skin: Negative for itching and rash.  Neurological: Negative for dizziness, headaches and numbness (or any sign of peripheral neuropathy).  Hematological: Negative for adenopathy. Does not bruise/bleed easily.  Psychiatric/Behavioral: Negative for depression. The patient is not nervous/anxious.       PHYSICAL EXAMINATION  ECOG PERFORMANCE STATUS: 1 - Symptomatic but completely ambulatory  Vitals:   03/07/17 8676  BP: 135/73  Pulse: 64  Resp: 17  Temp: 97.8 F (36.6 C)    Physical Exam  Constitutional: She is oriented to person, place, and time and well-developed, well-nourished, and in no distress.  HENT:  Head: Normocephalic and atraumatic.  Mouth/Throat: Oropharynx is clear and moist. No oropharyngeal exudate.  Eyes: Pupils are equal, round, and reactive to light. No scleral icterus.  Neck: Neck supple.  Cardiovascular: Normal rate, regular rhythm and normal heart sounds.   Pulmonary/Chest: Effort normal and breath sounds normal.  Abdominal: Soft. Bowel sounds are normal. She exhibits no distension and no mass. There is no tenderness. There is no rebound and no guarding.  Musculoskeletal: She exhibits no edema.  Lymphadenopathy:    She has no cervical adenopathy.  Neurological: She is alert and oriented to person, place, and time.  Skin: Skin is warm and dry. No rash noted.  Psychiatric: Mood and affect normal.    LABORATORY DATA:  CBC    Component Value Date/Time   WBC 4.2 03/07/2017 0757   WBC 13.4 (H) 09/08/2016 0130   RBC 3.63 (L) 03/07/2017 0757   RBC  3.94 09/08/2016 0130   HGB 10.8 (L) 03/07/2017 0757   HCT 32.4 (L) 03/07/2017 0757   PLT 157 03/07/2017 0757   MCV 89.4 03/07/2017 0757   MCH 29.7 03/07/2017 0757   MCH 28.2 09/08/2016 0130   MCHC 33.2 03/07/2017 0757   MCHC 34.0 09/08/2016 0130   RDW 14.8 (H) 03/07/2017 0757   LYMPHSABS 1.3 03/07/2017 0757   MONOABS 0.4 03/07/2017 0757   EOSABS 0.1 03/07/2017 0757   BASOSABS 0.0 03/07/2017 0757    CMP     Component Value Date/Time   NA 142 03/07/2017 0757   K 4.2 03/07/2017 0757   CL 107 09/08/2016 0130   CO2 26 03/07/2017 0757   GLUCOSE 93 03/07/2017 0757   BUN 10.6 03/07/2017 0757   CREATININE 0.7 03/07/2017 0757   CALCIUM 9.1 03/07/2017 0757   PROT 6.5 03/07/2017 0757   ALBUMIN 3.9 03/07/2017 0757   AST 17 03/07/2017 0757   ALT 15 03/07/2017 0757   ALKPHOS 55 03/07/2017 0757   BILITOT <0.22 03/07/2017 0757   GFRNONAA >60 09/08/2016 0130   GFRAA >60 09/08/2016 0130         ASSESSMENT and PLAN:   Metastatic cholangiocarcinoma to lung ([place]) Metastatic Cholangiocarcinoma to the lung  Nancy [name] is doing well today.  She will proceed with cycle 10 day 1 of Gemcitabine Cisplatin today.  She appears to be tolerating this without difficulty.  She will return in one week for labs and day 8 of chemotherapy.  I have placed a scheduling request to schedule her day 9 Neulasta and her cycle 11 of treatment, along with a Dr. Burr Medico appointment.  I ordered her restaging scans for August.    In regards to her baking sensation, I think we should monitor this for now.  I reassured her about her minimal hair growth over the past couple of weeks, along with her hot flashes.    Orders Placed This Encounter  Procedures  . CT Abdomen Pelvis W Contrast    Standing Status:   Future    Standing Expiration Date:   03/07/2018    Order Specific Question:   If indicated for the ordered procedure, I authorize the administration of contrast media per Radiology protocol    Answer:   Yes    Order  Specific Question:   Reason for Exam (SYMPTOM  OR  DIAGNOSIS REQUIRED)    Answer:   metastatic cholangiocarcinoma, restaging    Order Specific Question:   Is patient pregnant?    Answer:   No    Order Specific Question:   Preferred imaging location?    Answer:   [facility]    Order Specific Question:   Radiology Contrast Protocol - do NOT remove file path    Answer:   \\[email or web address].pdf  . CT Chest W Contrast    Standing Status:   Future    Standing Expiration Date:   03/07/2018    Order Specific Question:   If indicated for the ordered procedure, I authorize the administration of contrast media per Radiology protocol    Answer:   Yes    Order Specific Question:   Reason for Exam (SYMPTOM  OR DIAGNOSIS REQUIRED)    Answer:   metastatic cholangiocarcinoma, restaging    Order Specific Question:   Is patient pregnant?    Answer:   No    Order Specific Question:   Preferred imaging location?    Answer:   [facility]    Order Specific Question:   Radiology Contrast Protocol - do NOT remove file path    Answer:   \\[email or web address]    All questions were answered. The patient knows to call the clinic with any problems, questions or concerns. We can certainly see the patient much sooner if necessary.  A total of (30) minutes of face-to-face time was spent with this patient with greater than 50% of that time in counseling and care-coordination.  This note was electronically signed. Scot Dock, NP 03/07/2017

## 2017-03-07 NOTE — Patient Instructions (Signed)
[facility] Discharge Instructions for Patients Receiving Chemotherapy  Today you received the following chemotherapy agents:  Gemcitabine & Cisplatin  To help prevent nausea and vomiting after your treatment, we encourage you to take your nausea medication.   If you develop nausea and vomiting that is not controlled by your nausea medication, call the clinic.   BELOW ARE SYMPTOMS THAT SHOULD BE REPORTED IMMEDIATELY:  *FEVER GREATER THAN 100.5 F  *CHILLS WITH OR WITHOUT FEVER  NAUSEA AND VOMITING THAT IS NOT CONTROLLED WITH YOUR NAUSEA MEDICATION  *UNUSUAL SHORTNESS OF BREATH  *UNUSUAL BRUISING OR BLEEDING  TENDERNESS IN MOUTH AND THROAT WITH OR WITHOUT PRESENCE OF ULCERS  *URINARY PROBLEMS  *BOWEL PROBLEMS  UNUSUAL RASH Items with * indicate a potential emergency and should be followed up as soon as possible.  Feel free to call the clinic you have any questions or concerns. The clinic phone number is [phone].  Please show the [facility] at check-in to the Emergency Department and triage nurse.  Come @ 4:30 pm tomorrow for neulasta Call for any symptoms of infection.

## 2017-03-07 NOTE — Assessment & Plan Note (Signed)
Metastatic Cholangiocarcinoma to the lung  Nancy [name] is doing well today.  She will proceed with cycle 10 day 1 of Gemcitabine Cisplatin today.  She appears to be tolerating this without difficulty.  She will return in one week for labs and day 8 of chemotherapy.  I have placed a scheduling request to schedule her day 9 Neulasta and her cycle 11 of treatment, along with a Dr. Burr Medico appointment.  I ordered her restaging scans for August.    In regards to her baking sensation, I think we should monitor this for now.  I reassured her about her minimal hair growth over the past couple of weeks, along with her hot flashes.

## 2017-03-08 LAB — CANCER ANTIGEN 19-9: CA 19-9: 43 U/mL — ABNORMAL HIGH (ref 0–35)

## 2017-03-14 ENCOUNTER — Other Ambulatory Visit (HOSPITAL_BASED_OUTPATIENT_CLINIC_OR_DEPARTMENT_OTHER): Payer: Federal, State, Local not specified - PPO

## 2017-03-14 ENCOUNTER — Telehealth: Payer: Self-pay | Admitting: Hematology

## 2017-03-14 ENCOUNTER — Ambulatory Visit (HOSPITAL_BASED_OUTPATIENT_CLINIC_OR_DEPARTMENT_OTHER): Payer: Federal, State, Local not specified - PPO

## 2017-03-14 VITALS — BP 121/82 | HR 74 | Temp 98.4°F | Resp 18

## 2017-03-14 DIAGNOSIS — C221 Intrahepatic bile duct carcinoma: Secondary | ICD-10-CM

## 2017-03-14 DIAGNOSIS — Z5189 Encounter for other specified aftercare: Secondary | ICD-10-CM

## 2017-03-14 DIAGNOSIS — C7801 Secondary malignant neoplasm of right lung: Secondary | ICD-10-CM | POA: Diagnosis not present

## 2017-03-14 LAB — CBC WITH DIFFERENTIAL/PLATELET
BASO%: 0 % (ref 0.0–2.0)
Basophils Absolute: 0 10*3/uL (ref 0.0–0.1)
EOS%: 0.4 % (ref 0.0–7.0)
Eosinophils Absolute: 0 10*3/uL (ref 0.0–0.5)
HCT: 30.3 % — ABNORMAL LOW (ref 34.8–46.6)
HGB: 10 g/dL — ABNORMAL LOW (ref 11.6–15.9)
LYMPH%: 71.5 % — ABNORMAL HIGH (ref 14.0–49.7)
MCH: 29.1 pg (ref 25.1–34.0)
MCHC: 33 g/dL (ref 31.5–36.0)
MCV: 88.1 fL (ref 79.5–101.0)
MONO#: 0.1 10*3/uL (ref 0.1–0.9)
MONO%: 2 % (ref 0.0–14.0)
NEUT#: 0.7 10*3/uL — ABNORMAL LOW (ref 1.5–6.5)
NEUT%: 26.1 % — ABNORMAL LOW (ref 38.4–76.8)
Platelets: 175 10*3/uL (ref 145–400)
RBC: 3.44 10*6/uL — ABNORMAL LOW (ref 3.70–5.45)
RDW: 13.7 % (ref 11.2–14.5)
WBC: 2.6 10*3/uL — ABNORMAL LOW (ref 3.9–10.3)
lymph#: 1.8 10*3/uL (ref 0.9–3.3)

## 2017-03-14 LAB — COMPREHENSIVE METABOLIC PANEL
ALT: 18 U/L (ref 0–55)
AST: 16 U/L (ref 5–34)
Albumin: 4 g/dL (ref 3.5–5.0)
Alkaline Phosphatase: 44 U/L (ref 40–150)
Anion Gap: 7 mEq/L (ref 3–11)
BUN: 13.1 mg/dL (ref 7.0–26.0)
CO2: 27 mEq/L (ref 22–29)
Calcium: 9.3 mg/dL (ref 8.4–10.4)
Chloride: 104 mEq/L (ref 98–109)
Creatinine: 0.7 mg/dL (ref 0.6–1.1)
EGFR: >90 mL/min/{1.73_m2} (ref >90)
Glucose: 89 mg/dl (ref 70–140)
Potassium: 4.3 mEq/L (ref 3.5–5.1)
Sodium: 138 mEq/L (ref 136–145)
Total Bilirubin: <0.22 mg/dL (ref 0.20–1.20)
Total Protein: 6.7 g/dL (ref 6.4–8.3)

## 2017-03-14 LAB — MAGNESIUM: Magnesium: 1.9 mg/dl (ref 1.5–2.5)

## 2017-03-14 MED ORDER — HEPARIN SOD (PORK) LOCK FLUSH 100 UNIT/ML IV SOLN
500.0000 [IU] | Freq: Once | INTRAVENOUS | Status: AC | PRN
  Administered 2017-03-14: 500 [IU]
  Filled 2017-03-14: qty 5

## 2017-03-14 MED ORDER — TBO-FILGRASTIM 480 MCG/0.8ML ~~LOC~~ SOSY
480.0000 ug | PREFILLED_SYRINGE | Freq: Once | SUBCUTANEOUS | Status: AC
  Administered 2017-03-14: 480 ug via SUBCUTANEOUS
  Filled 2017-03-14: qty 0.8

## 2017-03-14 MED ORDER — SODIUM CHLORIDE 0.9% FLUSH
10.0000 mL | INTRAVENOUS | Status: DC | PRN
  Administered 2017-03-14: 10 mL
  Filled 2017-03-14: qty 10

## 2017-03-14 NOTE — Patient Instructions (Addendum)
Tbo-Filgrastim injection What is this medicine? TBO-FILGRASTIM (T B O fil GRA stim) is a granulocyte colony-stimulating factor that stimulates the growth of neutrophils, a type of white blood cell important in the body's fight against infection. It is used to reduce the incidence of fever and infection in patients with certain types of cancer who are receiving chemotherapy that affects the bone marrow. This medicine may be used for other purposes; ask your health care provider or pharmacist if you have questions. COMMON BRAND NAME(S): Granix What should I tell my health care provider before I take this medicine? They need to know if you have any of these conditions: -bone scan or tests planned -kidney disease -sickle cell anemia -an unusual or allergic reaction to tbo-filgrastim, filgrastim, pegfilgrastim, other medicines, foods, dyes, or preservatives -pregnant or trying to get pregnant -breast-feeding How should I use this medicine? This medicine is for injection under the skin. If you get this medicine at home, you will be taught how to prepare and give this medicine. Refer to the Instructions for Use that come with your medication packaging. Use exactly as directed. Take your medicine at regular intervals. Do not take your medicine more often than directed. It is important that you put your used needles and syringes in a special sharps container. Do not put them in a trash can. If you do not have a sharps container, call your pharmacist or healthcare provider to get one. Talk to your pediatrician regarding the use of this medicine in children. Special care may be needed. Overdosage: If you think you have taken too much of this medicine contact a poison control center or emergency room at once. NOTE: This medicine is only for you. Do not share this medicine with others. What if I miss a dose? It is important not to miss your dose. Call your doctor or health care professional if you miss a  dose. What may interact with this medicine? This medicine may interact with the following medications: -medicines that may cause a release of neutrophils, such as lithium This list may not describe all possible interactions. Give your health care provider a list of all the medicines, herbs, non-prescription drugs, or dietary supplements you use. Also tell them if you smoke, drink alcohol, or use illegal drugs. Some items may interact with your medicine. What should I watch for while using this medicine? You may need blood work done while you are taking this medicine. What side effects may I notice from receiving this medicine? Side effects that you should report to your doctor or health care professional as soon as possible: -allergic reactions like skin rash, itching or hives, swelling of the face, lips, or tongue -blood in the urine -dark urine -dizziness -fast heartbeat -feeling faint -shortness of breath or breathing problems -signs and symptoms of infection like fever or chills; cough; or sore throat -signs and symptoms of kidney injury like trouble passing urine or change in the amount of urine -stomach or side pain, or pain at the shoulder -sweating -swelling of the legs, ankles, or abdomen -tiredness Side effects that usually do not require medical attention (report to your doctor or health care professional if they continue or are bothersome): -bone pain -headache -muscle pain -vomiting This list may not describe all possible side effects. Call your doctor for medical advice about side effects. You may report side effects to FDA at 1-800-FDA-1088. Where should I keep my medicine? Keep out of the reach of children. Store in a refrigerator between   2 and 8 degrees C (36 and 46 degrees F). Keep in carton to protect from light. Throw away this medicine if it is left out of the refrigerator for more than 5 consecutive days. Throw away any unused medicine after the expiration  date. NOTE: This sheet is a summary. It may not cover all possible information. If you have questions about this medicine, talk to your doctor, pharmacist, or health care provider.  2018 Elsevier/Gold Standard (2015-10-06 19:07:04)  

## 2017-03-14 NOTE — Telephone Encounter (Signed)
Patient treatment delayed until next week - see both 7/16 schedule messages. Gave husband updated scheduled. Care plan not yet updated. Left message for desk nurse to confirm if patient will be on day1 or day8 next week and where injection appointments should fall. Husband aware we are awaiting response for injection date and will get updated schedule 7/25. YF aware tx is on 7/25.

## 2017-03-15 ENCOUNTER — Encounter: Payer: Federal, State, Local not specified - PPO | Admitting: Family Medicine

## 2017-03-15 ENCOUNTER — Ambulatory Visit: Payer: Federal, State, Local not specified - PPO

## 2017-03-15 ENCOUNTER — Other Ambulatory Visit: Payer: Self-pay | Admitting: Hematology

## 2017-03-22 ENCOUNTER — Other Ambulatory Visit: Payer: Federal, State, Local not specified - PPO

## 2017-03-22 ENCOUNTER — Ambulatory Visit: Payer: Federal, State, Local not specified - PPO

## 2017-03-23 ENCOUNTER — Telehealth: Payer: Self-pay | Admitting: Hematology

## 2017-03-23 ENCOUNTER — Ambulatory Visit (HOSPITAL_BASED_OUTPATIENT_CLINIC_OR_DEPARTMENT_OTHER): Payer: Federal, State, Local not specified - PPO

## 2017-03-23 ENCOUNTER — Other Ambulatory Visit: Payer: Self-pay | Admitting: Hematology

## 2017-03-23 ENCOUNTER — Other Ambulatory Visit (HOSPITAL_BASED_OUTPATIENT_CLINIC_OR_DEPARTMENT_OTHER): Payer: Federal, State, Local not specified - PPO

## 2017-03-23 VITALS — BP 128/88 | HR 77 | Temp 98.7°F | Resp 18 | Wt 139.5 lb

## 2017-03-23 DIAGNOSIS — C7801 Secondary malignant neoplasm of right lung: Secondary | ICD-10-CM

## 2017-03-23 DIAGNOSIS — Z5189 Encounter for other specified aftercare: Secondary | ICD-10-CM

## 2017-03-23 DIAGNOSIS — C221 Intrahepatic bile duct carcinoma: Secondary | ICD-10-CM

## 2017-03-23 DIAGNOSIS — Z95828 Presence of other vascular implants and grafts: Secondary | ICD-10-CM

## 2017-03-23 DIAGNOSIS — C7800 Secondary malignant neoplasm of unspecified lung: Secondary | ICD-10-CM

## 2017-03-23 LAB — COMPREHENSIVE METABOLIC PANEL
ALT: 14 U/L (ref 0–55)
AST: 18 U/L (ref 5–34)
Albumin: 4 g/dL (ref 3.5–5.0)
Alkaline Phosphatase: 46 U/L (ref 40–150)
Anion Gap: 8 mEq/L (ref 3–11)
BUN: 13.4 mg/dL (ref 7.0–26.0)
CO2: 26 mEq/L (ref 22–29)
Calcium: 9.2 mg/dL (ref 8.4–10.4)
Chloride: 106 mEq/L (ref 98–109)
Creatinine: 0.7 mg/dL (ref 0.6–1.1)
EGFR: >90 mL/min/{1.73_m2} (ref >90)
Glucose: 88 mg/dl (ref 70–140)
Potassium: 4 mEq/L (ref 3.5–5.1)
Sodium: 141 mEq/L (ref 136–145)
Total Bilirubin: 0.38 mg/dL (ref 0.20–1.20)
Total Protein: 6.6 g/dL (ref 6.4–8.3)

## 2017-03-23 LAB — CBC WITH DIFFERENTIAL/PLATELET
BASO%: 0.5 % (ref 0.0–2.0)
Basophils Absolute: 0 10*3/uL (ref 0.0–0.1)
EOS%: 1.4 % (ref 0.0–7.0)
Eosinophils Absolute: 0 10*3/uL (ref 0.0–0.5)
HCT: 31.5 % — ABNORMAL LOW (ref 34.8–46.6)
HGB: 10.4 g/dL — ABNORMAL LOW (ref 11.6–15.9)
LYMPH%: 59 % — ABNORMAL HIGH (ref 14.0–49.7)
MCH: 29.3 pg (ref 25.1–34.0)
MCHC: 33 g/dL (ref 31.5–36.0)
MCV: 88.7 fL (ref 79.5–101.0)
MONO#: 0.3 10*3/uL (ref 0.1–0.9)
MONO%: 11.5 % (ref 0.0–14.0)
NEUT#: 0.6 10*3/uL — ABNORMAL LOW (ref 1.5–6.5)
NEUT%: 27.6 % — ABNORMAL LOW (ref 38.4–76.8)
Platelets: 157 10*3/uL (ref 145–400)
RBC: 3.55 10*6/uL — ABNORMAL LOW (ref 3.70–5.45)
RDW: 14.5 % (ref 11.2–14.5)
WBC: 2.2 10*3/uL — ABNORMAL LOW (ref 3.9–10.3)
lymph#: 1.3 10*3/uL (ref 0.9–3.3)
nRBC: 0 % (ref 0–0)

## 2017-03-23 LAB — MAGNESIUM: Magnesium: 2.2 mg/dl (ref 1.5–2.5)

## 2017-03-23 MED ORDER — TBO-FILGRASTIM 480 MCG/0.8ML ~~LOC~~ SOSY
480.0000 ug | PREFILLED_SYRINGE | Freq: Once | SUBCUTANEOUS | Status: AC
  Administered 2017-03-23: 480 ug via SUBCUTANEOUS
  Filled 2017-03-23: qty 0.8

## 2017-03-23 MED ORDER — HEPARIN SOD (PORK) LOCK FLUSH 100 UNIT/ML IV SOLN
500.0000 [IU] | Freq: Once | INTRAVENOUS | Status: AC
  Administered 2017-03-23: 500 [IU] via INTRAVENOUS
  Filled 2017-03-23: qty 5

## 2017-03-23 MED ORDER — SODIUM CHLORIDE 0.9% FLUSH
10.0000 mL | INTRAVENOUS | Status: DC | PRN
  Administered 2017-03-23: 10 mL via INTRAVENOUS
  Filled 2017-03-23: qty 10

## 2017-03-23 MED ORDER — HYDROCODONE-ACETAMINOPHEN 5-325 MG PO TABS: 1.0000 | ORAL_TABLET | Freq: Four times a day (QID) | ORAL | 0 refills | Status: DC | PRN

## 2017-03-23 NOTE — Progress Notes (Signed)
Patient not being treated today due to labs, will get Granix today and tomorrow per MD Burr Medico. Then chemo on 8/1 and change 8/13 chemo to 8/8 and patient and spouse agreed. She will see Dr Burr Medico on 8/1 too. She reports having intermittent upper back pain and it is alleviated by using heat. Advised her to call if that pain is not relieved by anything or if it worsens. NO shortness of breath or other concerns.

## 2017-03-23 NOTE — Telephone Encounter (Signed)
Scheduled appt per sch message - gave patient calender - msg 7/25 from Dr. Burr Medico

## 2017-03-23 NOTE — Patient Instructions (Signed)
Tbo-Filgrastim injection What is this medicine? TBO-FILGRASTIM (T B O fil GRA stim) is a granulocyte colony-stimulating factor that stimulates the growth of neutrophils, a type of white blood cell important in the body's fight against infection. It is used to reduce the incidence of fever and infection in patients with certain types of cancer who are receiving chemotherapy that affects the bone marrow. This medicine may be used for other purposes; ask your health care provider or pharmacist if you have questions. COMMON BRAND NAME(S): Granix What should I tell my health care provider before I take this medicine? They need to know if you have any of these conditions: -bone scan or tests planned -kidney disease -sickle cell anemia -an unusual or allergic reaction to tbo-filgrastim, filgrastim, pegfilgrastim, other medicines, foods, dyes, or preservatives -pregnant or trying to get pregnant -breast-feeding How should I use this medicine? This medicine is for injection under the skin. If you get this medicine at home, you will be taught how to prepare and give this medicine. Refer to the Instructions for Use that come with your medication packaging. Use exactly as directed. Take your medicine at regular intervals. Do not take your medicine more often than directed. It is important that you put your used needles and syringes in a special sharps container. Do not put them in a trash can. If you do not have a sharps container, call your pharmacist or healthcare provider to get one. Talk to your pediatrician regarding the use of this medicine in children. Special care may be needed. Overdosage: If you think you have taken too much of this medicine contact a poison control center or emergency room at once. NOTE: This medicine is only for you. Do not share this medicine with others. What if I miss a dose? It is important not to miss your dose. Call your doctor or health care professional if you miss a  dose. What may interact with this medicine? This medicine may interact with the following medications: -medicines that may cause a release of neutrophils, such as lithium This list may not describe all possible interactions. Give your health care provider a list of all the medicines, herbs, non-prescription drugs, or dietary supplements you use. Also tell them if you smoke, drink alcohol, or use illegal drugs. Some items may interact with your medicine. What should I watch for while using this medicine? You may need blood work done while you are taking this medicine. What side effects may I notice from receiving this medicine? Side effects that you should report to your doctor or health care professional as soon as possible: -allergic reactions like skin rash, itching or hives, swelling of the face, lips, or tongue -blood in the urine -dark urine -dizziness -fast heartbeat -feeling faint -shortness of breath or breathing problems -signs and symptoms of infection like fever or chills; cough; or sore throat -signs and symptoms of kidney injury like trouble passing urine or change in the amount of urine -stomach or side pain, or pain at the shoulder -sweating -swelling of the legs, ankles, or abdomen -tiredness Side effects that usually do not require medical attention (report to your doctor or health care professional if they continue or are bothersome): -bone pain -headache -muscle pain -vomiting This list may not describe all possible side effects. Call your doctor for medical advice about side effects. You may report side effects to FDA at 1-800-FDA-1088. Where should I keep my medicine? Keep out of the reach of children. Store in a refrigerator between   2 and 8 degrees C (36 and 46 degrees F). Keep in carton to protect from light. Throw away this medicine if it is left out of the refrigerator for more than 5 consecutive days. Throw away any unused medicine after the expiration  date. NOTE: This sheet is a summary. It may not cover all possible information. If you have questions about this medicine, talk to your doctor, pharmacist, or health care provider.  2018 Elsevier/Gold Standard (2015-10-06 19:07:04)  

## 2017-03-24 ENCOUNTER — Ambulatory Visit (HOSPITAL_BASED_OUTPATIENT_CLINIC_OR_DEPARTMENT_OTHER): Payer: Federal, State, Local not specified - PPO

## 2017-03-24 VITALS — BP 138/80 | HR 84 | Temp 98.2°F | Resp 18

## 2017-03-24 DIAGNOSIS — C221 Intrahepatic bile duct carcinoma: Secondary | ICD-10-CM | POA: Diagnosis not present

## 2017-03-24 DIAGNOSIS — C7801 Secondary malignant neoplasm of right lung: Secondary | ICD-10-CM | POA: Diagnosis not present

## 2017-03-24 DIAGNOSIS — Z5189 Encounter for other specified aftercare: Secondary | ICD-10-CM | POA: Diagnosis not present

## 2017-03-24 MED ORDER — TBO-FILGRASTIM 480 MCG/0.8ML ~~LOC~~ SOSY
480.0000 ug | PREFILLED_SYRINGE | Freq: Once | SUBCUTANEOUS | Status: AC
  Administered 2017-03-24: 480 ug via SUBCUTANEOUS
  Filled 2017-03-24: qty 0.8

## 2017-03-24 NOTE — Patient Instructions (Signed)
Tbo-Filgrastim injection What is this medicine? TBO-FILGRASTIM (T B O fil GRA stim) is a granulocyte colony-stimulating factor that stimulates the growth of neutrophils, a type of white blood cell important in the body's fight against infection. It is used to reduce the incidence of fever and infection in patients with certain types of cancer who are receiving chemotherapy that affects the bone marrow. This medicine may be used for other purposes; ask your health care provider or pharmacist if you have questions. COMMON BRAND NAME(S): Granix What should I tell my health care provider before I take this medicine? They need to know if you have any of these conditions: -bone scan or tests planned -kidney disease -sickle cell anemia -an unusual or allergic reaction to tbo-filgrastim, filgrastim, pegfilgrastim, other medicines, foods, dyes, or preservatives -pregnant or trying to get pregnant -breast-feeding How should I use this medicine? This medicine is for injection under the skin. If you get this medicine at home, you will be taught how to prepare and give this medicine. Refer to the Instructions for Use that come with your medication packaging. Use exactly as directed. Take your medicine at regular intervals. Do not take your medicine more often than directed. It is important that you put your used needles and syringes in a special sharps container. Do not put them in a trash can. If you do not have a sharps container, call your pharmacist or healthcare provider to get one. Talk to your pediatrician regarding the use of this medicine in children. Special care may be needed. Overdosage: If you think you have taken too much of this medicine contact a poison control center or emergency room at once. NOTE: This medicine is only for you. Do not share this medicine with others. What if I miss a dose? It is important not to miss your dose. Call your doctor or health care professional if you miss a  dose. What may interact with this medicine? This medicine may interact with the following medications: -medicines that may cause a release of neutrophils, such as lithium This list may not describe all possible interactions. Give your health care provider a list of all the medicines, herbs, non-prescription drugs, or dietary supplements you use. Also tell them if you smoke, drink alcohol, or use illegal drugs. Some items may interact with your medicine. What should I watch for while using this medicine? You may need blood work done while you are taking this medicine. What side effects may I notice from receiving this medicine? Side effects that you should report to your doctor or health care professional as soon as possible: -allergic reactions like skin rash, itching or hives, swelling of the face, lips, or tongue -blood in the urine -dark urine -dizziness -fast heartbeat -feeling faint -shortness of breath or breathing problems -signs and symptoms of infection like fever or chills; cough; or sore throat -signs and symptoms of kidney injury like trouble passing urine or change in the amount of urine -stomach or side pain, or pain at the shoulder -sweating -swelling of the legs, ankles, or abdomen -tiredness Side effects that usually do not require medical attention (report to your doctor or health care professional if they continue or are bothersome): -bone pain -headache -muscle pain -vomiting This list may not describe all possible side effects. Call your doctor for medical advice about side effects. You may report side effects to FDA at 1-800-FDA-1088. Where should I keep my medicine? Keep out of the reach of children. Store in a refrigerator between   2 and 8 degrees C (36 and 46 degrees F). Keep in carton to protect from light. Throw away this medicine if it is left out of the refrigerator for more than 5 consecutive days. Throw away any unused medicine after the expiration  date. NOTE: This sheet is a summary. It may not cover all possible information. If you have questions about this medicine, talk to your doctor, pharmacist, or health care provider.  2018 Elsevier/Gold Standard (2015-10-06 19:07:04)  

## 2017-03-28 ENCOUNTER — Ambulatory Visit: Payer: Federal, State, Local not specified - PPO | Admitting: Hematology

## 2017-03-28 ENCOUNTER — Ambulatory Visit: Payer: Federal, State, Local not specified - PPO

## 2017-03-28 ENCOUNTER — Other Ambulatory Visit: Payer: Federal, State, Local not specified - PPO

## 2017-03-30 ENCOUNTER — Ambulatory Visit (HOSPITAL_BASED_OUTPATIENT_CLINIC_OR_DEPARTMENT_OTHER): Payer: Federal, State, Local not specified - PPO | Admitting: Hematology

## 2017-03-30 ENCOUNTER — Other Ambulatory Visit (HOSPITAL_BASED_OUTPATIENT_CLINIC_OR_DEPARTMENT_OTHER): Payer: Federal, State, Local not specified - PPO

## 2017-03-30 ENCOUNTER — Telehealth: Payer: Self-pay | Admitting: Hematology

## 2017-03-30 ENCOUNTER — Ambulatory Visit (HOSPITAL_BASED_OUTPATIENT_CLINIC_OR_DEPARTMENT_OTHER): Payer: Federal, State, Local not specified - PPO

## 2017-03-30 ENCOUNTER — Encounter: Payer: Self-pay | Admitting: Hematology

## 2017-03-30 VITALS — BP 147/85 | HR 70 | Temp 97.9°F | Resp 18 | Wt 141.2 lb

## 2017-03-30 DIAGNOSIS — T451X5A Adverse effect of antineoplastic and immunosuppressive drugs, initial encounter: Secondary | ICD-10-CM

## 2017-03-30 DIAGNOSIS — D6481 Anemia due to antineoplastic chemotherapy: Secondary | ICD-10-CM

## 2017-03-30 DIAGNOSIS — C7800 Secondary malignant neoplasm of unspecified lung: Secondary | ICD-10-CM | POA: Diagnosis not present

## 2017-03-30 DIAGNOSIS — C221 Intrahepatic bile duct carcinoma: Secondary | ICD-10-CM

## 2017-03-30 DIAGNOSIS — C7801 Secondary malignant neoplasm of right lung: Principal | ICD-10-CM

## 2017-03-30 DIAGNOSIS — C787 Secondary malignant neoplasm of liver and intrahepatic bile duct: Secondary | ICD-10-CM | POA: Diagnosis not present

## 2017-03-30 DIAGNOSIS — R05 Cough: Secondary | ICD-10-CM | POA: Diagnosis not present

## 2017-03-30 DIAGNOSIS — Z7189 Other specified counseling: Secondary | ICD-10-CM

## 2017-03-30 DIAGNOSIS — R109 Unspecified abdominal pain: Secondary | ICD-10-CM

## 2017-03-30 DIAGNOSIS — Z5111 Encounter for antineoplastic chemotherapy: Secondary | ICD-10-CM | POA: Diagnosis not present

## 2017-03-30 DIAGNOSIS — D701 Agranulocytosis secondary to cancer chemotherapy: Secondary | ICD-10-CM | POA: Diagnosis not present

## 2017-03-30 LAB — COMPREHENSIVE METABOLIC PANEL
ALT: 22 U/L (ref 0–55)
AST: 24 U/L (ref 5–34)
Albumin: 4 g/dL (ref 3.5–5.0)
Alkaline Phosphatase: 54 U/L (ref 40–150)
Anion Gap: 8 mEq/L (ref 3–11)
BUN: 12.2 mg/dL (ref 7.0–26.0)
CO2: 26 mEq/L (ref 22–29)
Calcium: 9.2 mg/dL (ref 8.4–10.4)
Chloride: 107 mEq/L (ref 98–109)
Creatinine: 0.7 mg/dL (ref 0.6–1.1)
EGFR: >90 mL/min/{1.73_m2} (ref >90)
Glucose: 88 mg/dl (ref 70–140)
Potassium: 3.9 mEq/L (ref 3.5–5.1)
Sodium: 142 mEq/L (ref 136–145)
Total Bilirubin: 0.27 mg/dL (ref 0.20–1.20)
Total Protein: 6.8 g/dL (ref 6.4–8.3)

## 2017-03-30 LAB — CBC WITH DIFFERENTIAL/PLATELET
BASO%: 0.8 % (ref 0.0–2.0)
Basophils Absolute: 0 10*3/uL (ref 0.0–0.1)
EOS%: 1.6 % (ref 0.0–7.0)
Eosinophils Absolute: 0.1 10*3/uL (ref 0.0–0.5)
HCT: 33.2 % — ABNORMAL LOW (ref 34.8–46.6)
HGB: 10.9 g/dL — ABNORMAL LOW (ref 11.6–15.9)
LYMPH%: 45.4 % (ref 14.0–49.7)
MCH: 28.9 pg (ref 25.1–34.0)
MCHC: 32.8 g/dL (ref 31.5–36.0)
MCV: 88.1 fL (ref 79.5–101.0)
MONO#: 0.3 10*3/uL (ref 0.1–0.9)
MONO%: 9.3 % (ref 0.0–14.0)
NEUT#: 1.6 10*3/uL (ref 1.5–6.5)
NEUT%: 42.9 % (ref 38.4–76.8)
Platelets: 238 10*3/uL (ref 145–400)
RBC: 3.77 10*6/uL (ref 3.70–5.45)
RDW: 14.7 % — ABNORMAL HIGH (ref 11.2–14.5)
WBC: 3.7 10*3/uL — ABNORMAL LOW (ref 3.9–10.3)
lymph#: 1.7 10*3/uL (ref 0.9–3.3)
nRBC: 0 % (ref 0–0)

## 2017-03-30 MED ORDER — HEPARIN SOD (PORK) LOCK FLUSH 100 UNIT/ML IV SOLN
500.0000 [IU] | Freq: Once | INTRAVENOUS | Status: AC | PRN
  Administered 2017-03-30: 500 [IU]
  Filled 2017-03-30: qty 5

## 2017-03-30 MED ORDER — POTASSIUM CHLORIDE 2 MEQ/ML IV SOLN
Freq: Once | INTRAVENOUS | Status: AC
  Administered 2017-03-30: 09:00:00 via INTRAVENOUS
  Filled 2017-03-30: qty 10

## 2017-03-30 MED ORDER — SODIUM CHLORIDE 0.9% FLUSH
10.0000 mL | INTRAVENOUS | Status: DC | PRN
  Administered 2017-03-30: 10 mL
  Filled 2017-03-30: qty 10

## 2017-03-30 MED ORDER — SODIUM CHLORIDE 0.9 % IV SOLN
25.0000 mg/m2 | Freq: Once | INTRAVENOUS | Status: AC
  Administered 2017-03-30: 43 mg via INTRAVENOUS
  Filled 2017-03-30: qty 43

## 2017-03-30 MED ORDER — FOSAPREPITANT DIMEGLUMINE INJECTION 150 MG
Freq: Once | INTRAVENOUS | Status: AC
  Administered 2017-03-30: 11:00:00 via INTRAVENOUS
  Filled 2017-03-30: qty 5

## 2017-03-30 MED ORDER — GEMCITABINE HCL CHEMO INJECTION 1 GM/26.3ML
900.0000 mg/m2 | Freq: Once | INTRAVENOUS | Status: AC
  Administered 2017-03-30: 1558 mg via INTRAVENOUS
  Filled 2017-03-30: qty 40.98

## 2017-03-30 MED ORDER — PALONOSETRON HCL INJECTION 0.25 MG/5ML
0.2500 mg | Freq: Once | INTRAVENOUS | Status: AC
  Administered 2017-03-30: 0.25 mg via INTRAVENOUS

## 2017-03-30 MED ORDER — PALONOSETRON HCL INJECTION 0.25 MG/5ML
INTRAVENOUS | Status: AC
  Filled 2017-03-30: qty 5

## 2017-03-30 MED ORDER — SODIUM CHLORIDE 0.9 % IV SOLN
Freq: Once | INTRAVENOUS | Status: AC
  Administered 2017-03-30: 11:00:00 via INTRAVENOUS

## 2017-03-30 NOTE — Progress Notes (Signed)
[place]  [phone] [phone]  Clinic Follow Up Note   Patient Care Team: Carollee Herter, Alferd Apa, DO as PCP - General Pyrtle, Lajuan Lines, MD as Consulting Physician (Gastroenterology) 03/30/2017  CHIEF COMPLAINTS:  Follow up metastatic intrahepatic cholangiocarcinoma  Oncology History   Metastatic cholangiocarcinoma to lung [facility])   Staging form: Perihilar Bile Ducts, AJCC 7th Edition   - Clinical stage from 07/07/2016: Stage IVB (T2b, N1, M1) - Signed by Truitt Merle, MD on 07/28/2016      Metastatic cholangiocarcinoma to lung ([place])   05/22/2014 Procedure    Routine screening colonoscopy showed a sessile polyp measuring 4 mm in the sigmoid colon, removed, otherwise negative.      06/29/2016 Imaging    CT chest, abdomen and pelvis showed 2 indeterminant nodule in left and the right lung, 4-84mm, indeterminate a hypovascular liver lesions largest in the caudate lobe measuring 2.6 cm, mild abdominal lymphadenopathy in the gastrohepatic ligament and portocaval space.      07/02/2016 Imaging    Abdominal MRI with and without contrast showed 2 lobular lesions in the caudate lobe, and is regular lymph nodes in the gastrohepatic ligament which is adjacent to the liver lesion, suspicious for malignancy. 2 small lesions in the right hepatic lobe are indeterminate, but concerning for metastasis.      07/07/2016 Procedure    EGD was negative, EUS biopsy of the liver and adjacent lymph node      07/07/2016 Initial Diagnosis    Metastatic cholangiocarcinoma to lung ([place])      07/07/2016 Initial Biopsy    Fine-needle aspiration of the liver lesion in caudate lobe and adjacent lymph nodes both showed metastatic adenocarcinoma.      08/11/2016 -  Chemotherapy    Cisplatin 25 mg/m, gemcitabine 1000 mg/m, on day one and 8, every 21 days        10/18/2016 Imaging    CT CAP  IMPRESSION: 1. Again noted are multifocal liver metastasis. Overall there has been no  significant interval change in overall volume of liver metastases. 2. Similar appearance of upper abdominal adenopathy 3. Slight decrease in size of small pulmonary metastases.      01/25/2017 Imaging    CT CAP W Contrast 01/25/17 IMPRESSION: 1. Response to therapy. 2. Similar to minimal decrease in pulmonary nodules/metastasis. 3. Improvement in hepatic disease burden. 4. Decrease in abdominal adenopathy. 5. Significantly age advanced atherosclerosis, including within the coronary arteries. 6. Uterine fibroids. 7. Suspect a complex cyst in the left ovary. Recommend attention on follow-up.        HISTORY OF PRESENTING ILLNESS: 07/28/16 Nancy [name] 51 y.o. female is here because of Her recently diagnosed metastatic glandular carcinoma. She is accompanied by her husband and mother to my clinic today.  She has been having RUQ abdominal pain since mid 04/2016, she was seen by PCP and was treated for gas with GI cocktail and pepcide, which she did not help much. Her pain got worse, and radiates to right shoulder, she denies significant nausea, or bloating, BM normal, no fever, cough or dyspnea. She recently noticed mild chest discomfort in the low sternum area. She initially had the lab, ultrasound done by her primary care physician, which was unrevealing. She was referred to GI Dr. Hilarie Fredrickson and underwent EGD and CT scan, which showed a lobulated mass in the caudate lobe of liver, with at adjacent adenopathy. He underwent EUS and fine-needle biopsy of the liver mass and lymph nodes, all reviewed adenocarcinoma.  She has lost about 7lbs in the past 3 months. She has mild fatigue, but able to function at home. She has stopped working due to her abdominal pain and fatigue. She takes Norco every 1-3 times a day, and her pain seems not well controlled.  CURRENT THERAPY: Cisplatin and gemcitabine on Day 1, 8 every 21 days, with neulasta on day 9, Gemcitabine dosed reduced to 800 mg/m due to  cytopenias, increased to 900mg /m2 on 12/06/2016.  INTERIM HISTORY:  Nancy [name] returns for follow up. She presents in the infusion room today with her family member. Her hair is growing back. She reports to feeling stomach pain after putting oil on her food. She would like refill of norco. She started in the last month eating tumeric. In between break of chemo she will get depressed because she is about to get treatment. She is not depressed everyday. She has abdominal discomfort. She is working with her job to work form home now. Over the last month she has sporadic dry cough through out the week. No nasal congestion or sore throat or back pain. Her left eye will experience white flashes, a distibular migraine, she takes a baby aspirin for. She has been eating more sweets lately  She will get chemo brain occasionally.  She only takes marinol during her chemo break to hep her eat.     MEDICAL HISTORY:  Past Medical History:  Diagnosis Date  . Allergy    SEASONAL  . Anemia    HIGH SCHOOL  . Atopic eczema   . cholangio ca dx'd 07/12/16  . Fibrocystic breast disease   . GERD (gastroesophageal reflux disease)   . Hx of migraines    seasonal   . Pneumonia    6 years ago   . TIA (transient ischemic attack)     SURGICAL HISTORY: Past Surgical History:  Procedure Laterality Date  . EUS N/A 07/07/2016   Procedure: UPPER ENDOSCOPIC ULTRASOUND (EUS) RADIAL;  Surgeon: [name], MD;  Location: [facility] ENDOSCOPY;  Service: Endoscopy;  Laterality: N/A;  . IR GENERIC HISTORICAL  08/03/2016   IR US GUIDE VASC ACCESS RIGHT 08/03/2016 Corrie Mckusick, DO [facility]-INTER[facility] RAD  . IR GENERIC HISTORICAL  08/03/2016   IR FLUORO GUIDE PORT INSERTION RIGHT 08/03/2016 Corrie Mckusick, DO [facility]  . MYOMECTOMY  2002    SOCIAL HISTORY: Social History   Social History  . Marital status: Married    Spouse name: N/A  . Number of children: 0  . Years of education: 75   Occupational History  . [employer]   .  [employer]   Social History Main Topics  . Smoking status: Never Smoker  . Smokeless tobacco: Never Used  . Alcohol use Yes     Comment: maybe 3 times a year  . Drug use: No  . Sexual activity: Yes    Partners: Male   Other Topics Concern  . Not on file   Social History Narrative  . No narrative on file    FAMILY HISTORY: Family History  Problem Relation Age of Onset  . Gout Mother   . Diabetes Mother   . Hypertension Father   . Diabetes Father   . Breast cancer Paternal Aunt   . Cancer Maternal Grandfather        throat cancer   . Cancer Cousin 45       GI cancer   . Thyroid disease Neg Hx     ALLERGIES:  is allergic  to penicillins and lipitor [atorvastatin].  MEDICATIONS:  Current Outpatient Prescriptions  Medication Sig Dispense Refill  . ALPRAZolam (XANAX) 0.5 MG tablet Take 1 tablet (0.5 mg total) by mouth as needed for anxiety. 10 tablet 0  . aspirin 81 MG tablet Take 81 mg by mouth daily.    . cetirizine (ZYRTEC) 10 MG tablet Take 10 mg by mouth daily as needed. For allergies     . Cholecalciferol (VITAMIN D3) 5000 UNITS CAPS Take 1 capsule by mouth daily.    . COLOSTRUM PO Take 1.25 g by mouth 2 (two) times daily. 1.25 grams twice daily    . dicyclomine (BENTYL) 20 MG tablet 3 (three) times daily as needed.     . dronabinol (MARINOL) 5 MG capsule Take 1 capsule (5 mg total) by mouth 2 (two) times daily before a meal. 60 capsule 1  . HYDROcodone-acetaminophen (NORCO) 5-325 MG tablet Take 1-2 tablets by mouth every 6 (six) hours as needed for severe pain. 60 tablet 0  . lidocaine-prilocaine (EMLA) cream Apply 1 application topically as needed. Apply to portacath 1 1/2 hours - 2 hours prior to procedures as needed. 30 g 1  . meclizine (ANTIVERT) 50 MG tablet Take 1 tablet (50 mg total) by mouth 3 (three) times daily as needed. 30 tablet 0  . Menaquinone-7 (VITAMIN K2) 40 MCG TABS Take by mouth.    . mometasone (ELOCON) 0.1 % lotion Apply topically daily. 60 mL 0    . omeprazole (PRILOSEC) 40 MG capsule Take 1 capsule (40 mg total) by mouth 2 (two) times daily before a meal. 60 capsule 11  . ondansetron (ZOFRAN) 8 MG tablet Take 1 tablet (8 mg total) by mouth every 8 (eight) hours as needed for nausea or vomiting. 20 tablet 1  . Prenatal Vit-Fe Fumarate-FA (PRENATAL MULTIVITAMIN) TABS tablet Take 2 tablets by mouth daily at 12 noon.    . triamcinolone cream (KENALOG) 0.1 % Apply topically.    Marland Kitchen VITAMIN K PO Take 1 capsule by mouth daily.     No current facility-administered medications for this visit.    Facility-Administered Medications Ordered in Other Visits  Medication Dose Route Frequency Provider Last Rate Last Dose  . alteplase (CATHFLO ACTIVASE) injection 2 mg  2 mg Intracatheter Once PRN Truitt Merle, MD      . heparin lock flush 100 unit/mL  250 Units Intracatheter Once PRN Truitt Merle, MD      . sodium chloride flush (NS) 0.9 % injection 10 mL  10 mL Intracatheter PRN Truitt Merle, MD   10 mL at 09/09/16 1645  . sodium chloride flush (NS) 0.9 % injection 10 mL  10 mL Intravenous PRN Truitt Merle, MD   10 mL at 02/11/17 1515  . sodium chloride flush (NS) 0.9 % injection 10 mL  10 mL Intracatheter PRN Truitt Merle, MD   10 mL at 02/21/17 1823    REVIEW OF SYSTEMS:  Constitutional: Denies fevers, chills or abnormal night sweats  Eyes: Denies blurriness of vision, double vision or watery eyes Ears, nose, mouth, throat, and face: Denies mucositis or sore throat Respiratory: Denies dyspnea or wheezes (+) occasionally dry cough  Cardiovascular: Denies palpitation, chest discomfort or lower extremity swelling Gastrointestinal:  Denies heartburn or change in bowel habits  (+) abdominal discomfort Skin: Denies abnormal skin rashes  Lymphatics: Denies new lymphadenopathy or easy bruising Neurological:Denies numbness, tingling or new weaknesses (+) "chemo brain" (+)  distibular migraine Behavioral/Psych: Mood is stable, no new changes  (+)  anxiety   Musculoskeletal:  All other systems were reviewed with the patient and are negative.  PHYSICAL EXAMINATION:  ECOG PERFORMANCE STATUS: 1 - Symptomatic but completely ambulatory  03/30/17: BP: 147/85, Heart rate is 70, respiratory rate 18, pulse ox 100% on room air, temperature 36.6 GENERAL:alert, no distress and comfortable SKIN: skin color, texture, turgor are normal, no rashes or significant lesions, several hematomas on extremities  EYES: normal, conjunctiva are pink and non-injected, sclera clear OROPHARYNX:no exudate, no erythema and lips, buccal mucosa NECK: supple, thyroid normal size, non-tender, without nodularity LYMPH:  no palpable lymphadenopathy in the cervical, axillary or inguinal LUNGS: clear to auscultation and percussion with normal breathing effort HEART: regular rate & rhythm and no murmurs and no lower extremity edema ABDOMEN:abdomen soft, non-tender and normal bowel sounds Musculoskeletal:no cyanosis of digits and no clubbing  PSYCH: alert & oriented x 3 with fluent speech NEURO: no focal motor/sensory deficits  LABORATORY DATA:  I have reviewed the data as listed CBC Latest Ref Rng & Units 03/30/2017 03/23/2017 03/14/2017  WBC 3.9 - 10.3 10e3/uL 3.7(L) 2.2(L) 2.6(L)  Hemoglobin 11.6 - 15.9 g/dL 10.9(L) 10.4(L) 10.0(L)  Hematocrit 34.8 - 46.6 % 33.2(L) 31.5(L) 30.3(L)  Platelets 145 - 400 10e3/uL 238 157 175   CMP Latest Ref Rng & Units 03/30/2017 03/23/2017 03/14/2017  Glucose 70 - 140 mg/dl 88 88 89  BUN 7.0 - 26.0 mg/dL 12.2 13.4 13.1  Creatinine 0.6 - 1.1 mg/dL 0.7 0.7 0.7  Sodium 136 - 145 mEq/L 142 141 138  Potassium 3.5 - 5.1 mEq/L 3.9 4.0 4.3  Chloride 101 - 111 mmol/L - - -  CO2 22 - 29 mEq/L 26 26 27   Calcium 8.4 - 10.4 mg/dL 9.2 9.2 9.3  Total Protein 6.4 - 8.3 g/dL 6.8 6.6 6.7  Total Bilirubin 0.20 - 1.20 mg/dL 0.27 0.38 <0.22  Alkaline Phos 40 - 150 U/L 54 46 44  AST 5 - 34 U/L 24 18 16   ALT 0 - 55 U/L 22 14 18    ANC 1.6 today   CA19.9  (0-35U/ml) 07/28/2016: 36 08/18/2016: 51 09/13/2016: 56 10/19/2016: 43 11/09/2016: 44 12/06/2016: 41 01/05/2017: 35 02/02/2017: 37 03/07/17: 43   PATHOLOGY REPORT  Diagnosis 07/07/2016 FINE NEEDLE ASPIRATION, ENDOSCOPIC, LIVER (SPECIMEN 2 OF 2 COLLECTED 07/07/16): MALIGNANT CELLS CONSISTENT WITH ADENOCARCINOMA.  ADDITIONAL INFORMATION: Per request, immunohistochemistry was performed. The cells are positive for cytokeratin 7 and negative for cytokeratin 20 and CDX-2. This excludes a colorectal primary. Pancreaticobiliary and upper GI sources remain in the differential. Called to Dr. Burr Medico on 07/28/2016.  Diagnosis 07/07/2016 FINE NEEDLE ASPIRATION, ENDOSCOPIC, GASTRO-HEPATIC LYMPH NODE (SPECIMEN 1 OF 2 COLLECTED 07/07/16): MALIGNANT CELLS CONSISTENT WITH ADENOCARCINOMA, SEE COMMENT. Preliminary Diagnosis Intraoperative Diagnosis: Few atypical glandular clusters - recommend additional material. 4) Adequate ([facility])  RADIOGRAPHIC STUDIES: I have personally reviewed the radiological images as listed and agreed with the findings in the report.  CT CAP W Contrast 01/25/17 IMPRESSION: 1. Response to therapy. 2. Similar to minimal decrease in pulmonary nodules/metastasis. 3. Improvement in hepatic disease burden. 4. Decrease in abdominal adenopathy. 5. Significantly age advanced atherosclerosis, including within the coronary arteries. 6. Uterine fibroids. 7. Suspect a complex cyst in the left ovary. Recommend attention on follow-up.  CT CAP W CONTRAST 10/18/16 IMPRESSION: 1. Again noted are multifocal liver metastasis. Overall there has been no significant interval change in overall volume of liver metastases. 2. Similar appearance of upper abdominal adenopathy 3. Slight decrease in size of small pulmonary metastases.  Upper EUS 07/07/2016  Dr. Ardis Hughs  - Endoscopically normal UGI tract (good views with standard gastroscope) - Vaguely bordered 4cm mass in the caudate lobe of liver that is  confluent with what appear to be enlarged gastrohepatic lymphnodes (these may represent direct tumor extension however). Preliminary cytology from the lymphnodes shows malignancy, glandular. Preliminary cytology from the liver mass shows the same. Await final cytology results but this is most suspicious for peripheral intrahepatic cholangiocarcinoma with adjacent malignant adenopathy.  COLONOSCOPY 05/22/2014 ENDOSCOPIC IMPRESSION: 1. Sessile polyp measuring 4 mm in size was found in the sigmoid colon; polypectomy was performed with a cold snare 2. The colon mucosa was otherwise normal  ASSESSMENT & PLAN:  50 y.o. African-American female, without significant past medical history, presented with abdominal pain  1. Metastatic intrahepatic cholangiocarcinoma, with probable node and lung metastasis, [id number], stage IV -I previously reviewed her CT, MRI, endoscopy findings and her biopsy results with patient and her family members in details. -We have previously reviewed her case in our GI tumor board. Based on the scans and endoscopy findings, this is most consistent with cholangiocarcinoma. Her liver and inguinal biopsy showed adenocarcinoma, IHC was consistent with pancreatic-biliary primary -She has 2 additional small liver lesions and 2 4-45mm lung lesions, which are indeterminate but that is suspicious for metastatic disease. -She was seen by surgeon Dr. Barry Dienes who thinks she is a not a candidate for surgical resection due to her metastatic disease. -We previously discussed her cancer is incurable at this stage, the goal of therapy is palliation, to prolong life and improve her quality life -she is on first-line cisplatin and gemcitabine, on day 1, 8 every 21 days.  -She has been tolerating chemotherapy moderately well, but had significant neutropenia and thrombocytopenia, cycle 2 chemotherapy was dose reduced -I previously reviewed her restaging CT scan from 10/19/2016, which showed stable  disease overall, no other new lesions. She is clinically doing well, her abdominal pain has much improved. -CT from 5/29 reviewed with pt and it shows excellent partical response to treatment, both liver lesions and adenopathy have decreased in size, small lung nodules are stable, no new lesions.  -We'll continue chemotherapy with cisplatin and gemcitabine at current dose, with neulasta on day 9  -Today she has granix to help recover her counts.  -will repeat CT scan in 2-3 weeks -labs reviewed and she is slightly aneamic and her WBC are slightly low but adequate to treat, we'll proceed cycle 11 chemotherapy today -Due to neutropenia, her last cycle chemotherapy was postponed a few times, I have at Granix on day 2 and Neulasta on day 9 after chemotherapy -We discussed giving chemo break if needed.   2. Abdominal pain, improving -continue hydrocodone/acetaminophen every 4-6 hours as needed for pain control  -Her pain has nearly resolved after she started chemotherapy, she noticed mild left-sided pain in the past few days, -Constipation and management previously reviewed with patient -I have previously recommended her to try TUMS  3. Nausea and anorexia and weight loss  -We previously reviewed her nausea management, she will continue Compazine and Zofran -I previously encouraged her to take a nutritional supplement -We previously discussed appetite stimulant, she is on Marinol, dose was increased to 5 mg twice daily last week, appetite improved, will continue. -she will f/u with dietician  -Previously encouraged her to have a high calorie, high protein diet.  4. Neutropenia and anemia -Secondary to chemotherapy, neutropenia much improved with neulasta. -She is taking pre-natal vitamins that contain B-12 which is helpful -Continue monitoring  5. Goal of care discussion  -We previously discussed the incurable nature of her cancer, and the overall poor prognosis, especially if she does not  have good response to chemotherapy or progress on chemo -The patient understands the goal of care is palliative. -I have previously recommended DNR/DNI, she will think about it.   Plan -refill Hydrocodone -Fill out wig prescription to "a special place" -Lab reviewed, adequate for treatment, we'll proceed with cycle 11 day 1 cisplatin and gemcitabine today, D8 next week, Granix tomorrow and Neulasta on day 9 -Lab, flush and chemo cisplatin and gemcitabine in 3 and 4 weeks, with injection on day 2 and 9 -F/u in 3 weeks with CT CAP with contrast a few days before    All questions were answered. The patient knows to call the clinic with any problems, questions or concerns.  I spent 20 minutes counseling the patient face to face. The total time spent in the appointment was 25 minutes and more than 50% was on counseling.   This document serves as a record of services personally performed by Truitt Merle, MD. It was created on her behalf by Joslyn Devon, a trained medical scribe. The creation of this record is based on the scribe's personal observations and the provider's statements to them. This document has been checked and approved by the attending provider.   I have reviewed the above documentation for accuracy and completeness and I agree with the above.   Truitt Merle, MD 03/30/2017

## 2017-03-30 NOTE — Patient Instructions (Signed)
[facility] Discharge Instructions for Patients Receiving Chemotherapy  Today you received the following chemotherapy agents:  Gemcitabine & Cisplatin.  To help prevent nausea and vomiting after your treatment, we encourage you to take your nausea medication.   If you develop nausea and vomiting that is not controlled by your nausea medication, call the clinic.   BELOW ARE SYMPTOMS THAT SHOULD BE REPORTED IMMEDIATELY:  *FEVER GREATER THAN 100.5 F  *CHILLS WITH OR WITHOUT FEVER  NAUSEA AND VOMITING THAT IS NOT CONTROLLED WITH YOUR NAUSEA MEDICATION  *UNUSUAL SHORTNESS OF BREATH  *UNUSUAL BRUISING OR BLEEDING  TENDERNESS IN MOUTH AND THROAT WITH OR WITHOUT PRESENCE OF ULCERS  *URINARY PROBLEMS  *BOWEL PROBLEMS  UNUSUAL RASH Items with * indicate a potential emergency and should be followed up as soon as possible.  Feel free to call the clinic you have any questions or concerns. The clinic phone number is [phone].  Please show the [facility] at check-in to the Emergency Department and triage nurse.  Come @ 4:30 pm tomorrow for neulasta Call for any symptoms of infection.

## 2017-03-30 NOTE — Telephone Encounter (Signed)
Scheduled appt per 8/1 los - per Dr. Burr Medico okay to move treatment days to Monday - patient to get new schedule when she comes in for inj 8/2

## 2017-03-31 ENCOUNTER — Other Ambulatory Visit: Payer: Self-pay | Admitting: *Deleted

## 2017-03-31 ENCOUNTER — Ambulatory Visit (HOSPITAL_BASED_OUTPATIENT_CLINIC_OR_DEPARTMENT_OTHER): Payer: Federal, State, Local not specified - PPO

## 2017-03-31 VITALS — BP 140/78 | HR 84 | Temp 98.1°F | Resp 18

## 2017-03-31 DIAGNOSIS — D701 Agranulocytosis secondary to cancer chemotherapy: Secondary | ICD-10-CM

## 2017-03-31 DIAGNOSIS — C787 Secondary malignant neoplasm of liver and intrahepatic bile duct: Secondary | ICD-10-CM | POA: Diagnosis not present

## 2017-03-31 DIAGNOSIS — C7800 Secondary malignant neoplasm of unspecified lung: Secondary | ICD-10-CM | POA: Diagnosis not present

## 2017-03-31 DIAGNOSIS — C221 Intrahepatic bile duct carcinoma: Secondary | ICD-10-CM | POA: Diagnosis not present

## 2017-03-31 DIAGNOSIS — C7801 Secondary malignant neoplasm of right lung: Principal | ICD-10-CM

## 2017-03-31 MED ORDER — TBO-FILGRASTIM 480 MCG/0.8ML ~~LOC~~ SOSY
480.0000 ug | PREFILLED_SYRINGE | Freq: Once | SUBCUTANEOUS | Status: AC
  Administered 2017-03-31: 480 ug via SUBCUTANEOUS
  Filled 2017-03-31: qty 0.8

## 2017-03-31 MED ORDER — HYDROCODONE-ACETAMINOPHEN 5-325 MG PO TABS: 1.0000 | ORAL_TABLET | Freq: Four times a day (QID) | ORAL | 0 refills | Status: DC | PRN

## 2017-03-31 NOTE — Patient Instructions (Signed)
Tbo-Filgrastim injection What is this medicine? TBO-FILGRASTIM (T B O fil GRA stim) is a granulocyte colony-stimulating factor that stimulates the growth of neutrophils, a type of white blood cell important in the body's fight against infection. It is used to reduce the incidence of fever and infection in patients with certain types of cancer who are receiving chemotherapy that affects the bone marrow. This medicine may be used for other purposes; ask your health care provider or pharmacist if you have questions. COMMON BRAND NAME(S): Granix What should I tell my health care provider before I take this medicine? They need to know if you have any of these conditions: -bone scan or tests planned -kidney disease -sickle cell anemia -an unusual or allergic reaction to tbo-filgrastim, filgrastim, pegfilgrastim, other medicines, foods, dyes, or preservatives -pregnant or trying to get pregnant -breast-feeding How should I use this medicine? This medicine is for injection under the skin. If you get this medicine at home, you will be taught how to prepare and give this medicine. Refer to the Instructions for Use that come with your medication packaging. Use exactly as directed. Take your medicine at regular intervals. Do not take your medicine more often than directed. It is important that you put your used needles and syringes in a special sharps container. Do not put them in a trash can. If you do not have a sharps container, call your pharmacist or healthcare provider to get one. Talk to your pediatrician regarding the use of this medicine in children. Special care may be needed. Overdosage: If you think you have taken too much of this medicine contact a poison control center or emergency room at once. NOTE: This medicine is only for you. Do not share this medicine with others. What if I miss a dose? It is important not to miss your dose. Call your doctor or health care professional if you miss a  dose. What may interact with this medicine? This medicine may interact with the following medications: -medicines that may cause a release of neutrophils, such as lithium This list may not describe all possible interactions. Give your health care provider a list of all the medicines, herbs, non-prescription drugs, or dietary supplements you use. Also tell them if you smoke, drink alcohol, or use illegal drugs. Some items may interact with your medicine. What should I watch for while using this medicine? You may need blood work done while you are taking this medicine. What side effects may I notice from receiving this medicine? Side effects that you should report to your doctor or health care professional as soon as possible: -allergic reactions like skin rash, itching or hives, swelling of the face, lips, or tongue -blood in the urine -dark urine -dizziness -fast heartbeat -feeling faint -shortness of breath or breathing problems -signs and symptoms of infection like fever or chills; cough; or sore throat -signs and symptoms of kidney injury like trouble passing urine or change in the amount of urine -stomach or side pain, or pain at the shoulder -sweating -swelling of the legs, ankles, or abdomen -tiredness Side effects that usually do not require medical attention (report to your doctor or health care professional if they continue or are bothersome): -bone pain -headache -muscle pain -vomiting This list may not describe all possible side effects. Call your doctor for medical advice about side effects. You may report side effects to FDA at 1-800-FDA-1088. Where should I keep my medicine? Keep out of the reach of children. Store in a refrigerator between   2 and 8 degrees C (36 and 46 degrees F). Keep in carton to protect from light. Throw away this medicine if it is left out of the refrigerator for more than 5 consecutive days. Throw away any unused medicine after the expiration  date. NOTE: This sheet is a summary. It may not cover all possible information. If you have questions about this medicine, talk to your doctor, pharmacist, or health care provider.  2018 Elsevier/Gold Standard (2015-10-06 19:07:04)  

## 2017-04-04 ENCOUNTER — Ambulatory Visit: Payer: Federal, State, Local not specified - PPO

## 2017-04-04 ENCOUNTER — Other Ambulatory Visit: Payer: Federal, State, Local not specified - PPO

## 2017-04-05 ENCOUNTER — Ambulatory Visit: Payer: Federal, State, Local not specified - PPO

## 2017-04-06 ENCOUNTER — Ambulatory Visit: Payer: Federal, State, Local not specified - PPO

## 2017-04-06 ENCOUNTER — Ambulatory Visit (HOSPITAL_BASED_OUTPATIENT_CLINIC_OR_DEPARTMENT_OTHER): Payer: Federal, State, Local not specified - PPO

## 2017-04-06 ENCOUNTER — Other Ambulatory Visit: Payer: Self-pay | Admitting: Hematology

## 2017-04-06 ENCOUNTER — Other Ambulatory Visit (HOSPITAL_BASED_OUTPATIENT_CLINIC_OR_DEPARTMENT_OTHER): Payer: Federal, State, Local not specified - PPO

## 2017-04-06 ENCOUNTER — Telehealth: Payer: Self-pay | Admitting: Hematology

## 2017-04-06 VITALS — BP 134/80 | HR 82 | Temp 98.0°F | Resp 16

## 2017-04-06 DIAGNOSIS — D701 Agranulocytosis secondary to cancer chemotherapy: Secondary | ICD-10-CM

## 2017-04-06 DIAGNOSIS — C7800 Secondary malignant neoplasm of unspecified lung: Secondary | ICD-10-CM

## 2017-04-06 DIAGNOSIS — C221 Intrahepatic bile duct carcinoma: Secondary | ICD-10-CM

## 2017-04-06 DIAGNOSIS — C7801 Secondary malignant neoplasm of right lung: Principal | ICD-10-CM

## 2017-04-06 DIAGNOSIS — C787 Secondary malignant neoplasm of liver and intrahepatic bile duct: Secondary | ICD-10-CM

## 2017-04-06 DIAGNOSIS — C211 Malignant neoplasm of anal canal: Secondary | ICD-10-CM | POA: Diagnosis not present

## 2017-04-06 LAB — CBC WITH DIFFERENTIAL/PLATELET
BASO%: 0.7 % (ref 0.0–2.0)
Basophils Absolute: 0 10*3/uL (ref 0.0–0.1)
EOS%: 0.7 % (ref 0.0–7.0)
Eosinophils Absolute: 0 10*3/uL (ref 0.0–0.5)
HCT: 32.7 % — ABNORMAL LOW (ref 34.8–46.6)
HGB: 10.8 g/dL — ABNORMAL LOW (ref 11.6–15.9)
LYMPH%: 67.1 % — ABNORMAL HIGH (ref 14.0–49.7)
MCH: 28.8 pg (ref 25.1–34.0)
MCHC: 33 g/dL (ref 31.5–36.0)
MCV: 87.2 fL (ref 79.5–101.0)
MONO#: 0.1 10*3/uL (ref 0.1–0.9)
MONO%: 4.3 % (ref 0.0–14.0)
NEUT#: 0.8 10*3/uL — ABNORMAL LOW (ref 1.5–6.5)
NEUT%: 27.2 % — ABNORMAL LOW (ref 38.4–76.8)
Platelets: 148 10*3/uL (ref 145–400)
RBC: 3.75 10*6/uL (ref 3.70–5.45)
RDW: 13.8 % (ref 11.2–14.5)
WBC: 3 10*3/uL — ABNORMAL LOW (ref 3.9–10.3)
lymph#: 2 10*3/uL (ref 0.9–3.3)

## 2017-04-06 LAB — COMPREHENSIVE METABOLIC PANEL
ALT: 19 U/L (ref 0–55)
AST: 18 U/L (ref 5–34)
Albumin: 3.9 g/dL (ref 3.5–5.0)
Alkaline Phosphatase: 49 U/L (ref 40–150)
Anion Gap: 9 mEq/L (ref 3–11)
BUN: 13.1 mg/dL (ref 7.0–26.0)
CO2: 26 mEq/L (ref 22–29)
Calcium: 9.3 mg/dL (ref 8.4–10.4)
Chloride: 106 mEq/L (ref 98–109)
Creatinine: 0.7 mg/dL (ref 0.6–1.1)
EGFR: >90 mL/min/{1.73_m2} (ref >90)
Glucose: 98 mg/dl (ref 70–140)
Potassium: 4.1 mEq/L (ref 3.5–5.1)
Sodium: 141 mEq/L (ref 136–145)
Total Bilirubin: 0.27 mg/dL (ref 0.20–1.20)
Total Protein: 6.8 g/dL (ref 6.4–8.3)

## 2017-04-06 MED ORDER — SODIUM CHLORIDE 0.9% FLUSH
10.0000 mL | INTRAVENOUS | Status: DC | PRN
  Administered 2017-04-06: 10 mL
  Filled 2017-04-06: qty 10

## 2017-04-06 MED ORDER — TBO-FILGRASTIM 480 MCG/0.8ML ~~LOC~~ SOSY
480.0000 ug | PREFILLED_SYRINGE | Freq: Once | SUBCUTANEOUS | Status: AC
  Administered 2017-04-06: 480 ug via SUBCUTANEOUS
  Filled 2017-04-06: qty 0.8

## 2017-04-06 MED ORDER — TBO-FILGRASTIM 480 MCG/0.8ML ~~LOC~~ SOSY
480.0000 ug | PREFILLED_SYRINGE | Freq: Once | SUBCUTANEOUS | Status: DC
Start: 2017-04-06 — End: 2017-04-06
  Filled 2017-04-06: qty 0.8

## 2017-04-06 MED ORDER — HEPARIN SOD (PORK) LOCK FLUSH 100 UNIT/ML IV SOLN
500.0000 [IU] | Freq: Once | INTRAVENOUS | Status: AC | PRN
  Administered 2017-04-06: 500 [IU]
  Filled 2017-04-06: qty 5

## 2017-04-06 NOTE — Telephone Encounter (Signed)
Spoke with patient's husband and gave him an updated calendar for August and September.

## 2017-04-06 NOTE — Patient Instructions (Signed)
Neutropenia Neutropenia is a condition that occurs when you have a lower-than-normal level of a type of white blood cell (neutrophil) in your body. Neutrophils are made in the spongy center of large bones (bone marrow) and they fight infections. Neutrophils are your body's main defense against bacterial and fungal infections. The fewer neutrophils you have and the longer your body remains without them, the greater your risk of getting a severe infection. What are the causes? This condition can occur if your body uses up or destroys neutrophils faster than your bone marrow can make them. This problem may happen because of:  Bacterial or fungal infection.  Allergic disorders.  Reactions to some medicines.  Autoimmune disease.  An enlarged spleen.  This condition can also occur if your bone marrow does not produce enough neutrophils. This problem may be caused by:  Cancer.  Cancer treatments, such as radiation or chemotherapy.  Viral infections.  Medicines, such as phenytoin.  Vitamin B12 deficiency.  Diseases of the bone marrow.  Environmental toxins, such as insecticides.  What are the signs or symptoms? This condition does not usually cause symptoms. If symptoms are present, they are usually caused by an underlying infection. Symptoms of an infection may include:  Fever.  Chills.  Swollen glands.  Oral or anal ulcers.  Cough and shortness of breath.  Rash.  Skin infection.  Fatigue.  How is this diagnosed? Your health care provider may suspect neutropenia if you have:  A condition that may cause neutropenia.  Symptoms of infection, especially fever.  Frequent and unusual infections.  You will have a medical history and physical exam. Tests will also be done, such as:  A complete blood count (CBC).  A procedure to collect a sample of bone marrow for examination (bone marrow biopsy).  A chest X-ray.  A urine culture.  A blood culture.  How is this  treated? Treatment depends on the underlying cause and severity of your condition. Mild neutropenia may not require treatment. Treatment may include medicines, such as:  Antibiotic medicine given through an IV tube.  Antiviral medicines.  Antifungal medicines.  A medicine to increase neutrophil production (colony-stimulating factor). You may get this drug through an IV tube or by injection.  Steroids given through an IV tube.  If an underlying condition is causing neutropenia, you may need treatment for that condition. If medicines you are taking are causing neutropenia, your health care provider may have you stop taking those medicines. Follow these instructions at home: Medicines  Take over-the-counter and prescription medicines only as told by your health care provider.  Get a seasonal flu shot (influenza vaccine). Lifestyle  Do not eat unpasteurized foods.Do not eat unwashed raw fruits or vegetables.  Avoid exposure to groups of people or children.  Avoid being around people who are sick.  Avoid being around dirt or dust, such as in construction areas or gardens.  Do not provide direct care for pets. Avoid animal droppings. Do not clean litter boxes and bird cages. Hygiene   Bathe daily.  Clean the area between the genitals and the anus (perineal area) after you urinate or have a bowel movement. If you are female, wipe from front to back.  Brush your teeth with a soft toothbrush before and after meals.  Do not use a razor that has a blade. Use an electric razor to remove hair.  Wash your hands often. Make sure others who come in contact with you also wash their hands. If soap and water  are not available, use hand sanitizer. General instructions  Do not have sex unless your health care provider has approved.  Take actions to avoid cuts and burns. For example: ? Be cautious when you use knives. Always cut away from yourself. ? Keep knives in protective sheaths or  guards when not in use. ? Use oven mitts when you cook with a hot stove, oven, or grill. ? Stand a safe distance away from open fires.  Avoid people who received a vaccine in the past 30 days if that vaccine contained a live version of the germ (live vaccine). You should not get a live vaccine. Common live vaccines are varicella, measles, mumps, and rubella.  Do not share food utensils.  Do not use tampons, enemas, or rectal suppositories unless your health care provider has approved.  Keep all appointments as told by your health care provider. This is important. Contact a health care provider if:  You have a fever.  You have chills or you start to shake.  You have: ? A sore throat. ? A warm, red, or tender area on your skin. ? A cough. ? Frequent or painful urination. ? Vaginal discharge or itching.  You develop: ? Sores in your mouth or anus. ? Swollen lymph nodes. ? Red streaks on the skin. ? A rash.  You feel: ? Nauseous or you vomit. ? Very fatigued. ? Short of breath. This information is not intended to replace advice given to you by your health care provider. Make sure you discuss any questions you have with your health care provider. Document Released: 02/05/2002 Document Revised: 01/22/2016 Document Reviewed: 02/26/2015 Elsevier Interactive Patient Education  2018 [employer].   Today you received Grannix 429mg.

## 2017-04-06 NOTE — Progress Notes (Unsigned)
granix

## 2017-04-06 NOTE — Progress Notes (Signed)
Discussed ANC of 0.8 with Dr. Burr Medico.  Per Dr. Burr Medico:  Can give patient a choice of getting tx today with reduced dose and neulasta support or patient can skip this tx and then get scan and see her as planned.  Patient would definitely like to skip this tx today.  We will give her grannix.  Discussed neutropenic precautions with patient and use of chemo card in ED if necessary.    Patient's husband went to scheduling and CT scans orders not seen.  Notified Jesse Fall RN for Dr. Burr Medico.  Patient is to get scan next week and then hopefully see Dr. Burr Medico and get chemo on 04/18/17.

## 2017-04-07 ENCOUNTER — Ambulatory Visit: Payer: Federal, State, Local not specified - PPO

## 2017-04-11 ENCOUNTER — Other Ambulatory Visit: Payer: Federal, State, Local not specified - PPO

## 2017-04-11 ENCOUNTER — Ambulatory Visit: Payer: Federal, State, Local not specified - PPO

## 2017-04-12 ENCOUNTER — Encounter: Payer: Self-pay | Admitting: Hematology

## 2017-04-12 ENCOUNTER — Ambulatory Visit: Payer: Federal, State, Local not specified - PPO

## 2017-04-12 NOTE — Progress Notes (Signed)
Pt called w/ financial concerns because she was sent to collections.  After reviewing her account I gathered her annual income to apply to [employer] for copay assistance to cover past dates of service but her household income exceeds the guidelines of their program and she's over qualified for the [employer] as well.  I was going to sign her up for the Neulasta First Step program but they will only go back 90 days so it won't cover the past dates of service and now her ins is paying for her current injections at 100% so no need to enroll her at this time.  I advised she call the number on the letter from the collection agency and inquire about setting up a payment plan with them.  She verbalized understanding.

## 2017-04-13 ENCOUNTER — Encounter (HOSPITAL_COMMUNITY): Payer: Self-pay

## 2017-04-13 ENCOUNTER — Ambulatory Visit (HOSPITAL_COMMUNITY)
Admission: RE | Admit: 2017-04-13 | Discharge: 2017-04-13 | Disposition: A | Discharge status: Home / Self Care | Payer: Federal, State, Local not specified - PPO | Source: Ambulatory Visit | Attending: Hematology | Admitting: Hematology

## 2017-04-13 DIAGNOSIS — R918 Other nonspecific abnormal finding of lung field: Secondary | ICD-10-CM | POA: Diagnosis not present

## 2017-04-13 DIAGNOSIS — C221 Intrahepatic bile duct carcinoma: Secondary | ICD-10-CM | POA: Diagnosis present

## 2017-04-13 DIAGNOSIS — C7801 Secondary malignant neoplasm of right lung: Secondary | ICD-10-CM | POA: Diagnosis present

## 2017-04-13 MED ORDER — IOPAMIDOL (ISOVUE-300) INJECTION 61%
INTRAVENOUS | Status: AC
  Filled 2017-04-13: qty 100

## 2017-04-13 MED ORDER — IOPAMIDOL (ISOVUE-300) INJECTION 61%
100.0000 mL | Freq: Once | INTRAVENOUS | Status: AC | PRN
  Administered 2017-04-13: 100 mL via INTRAVENOUS

## 2017-04-13 MED ORDER — HEPARIN SOD (PORK) LOCK FLUSH 100 UNIT/ML IV SOLN
INTRAVENOUS | Status: AC
  Filled 2017-04-13: qty 5

## 2017-04-13 NOTE — Progress Notes (Signed)
[place]  [phone]) [phone] [phone]  Clinic Follow Up Note   Patient Care Team: Carollee Herter, Alferd Apa, DO as PCP - General Pyrtle, Lajuan Lines, MD as Consulting Physician (Gastroenterology) 04/18/2017  CHIEF COMPLAINTS:  Follow up metastatic intrahepatic cholangiocarcinoma  Oncology History   Metastatic cholangiocarcinoma to lung [facility])   Staging form: Perihilar Bile Ducts, AJCC 7th Edition   - Clinical stage from 07/07/2016: Stage IVB (T2b, N1, M1) - Signed by Truitt Merle, MD on 07/28/2016      Metastatic cholangiocarcinoma to lung ([place])   05/22/2014 Procedure    Routine screening colonoscopy showed a sessile polyp measuring 4 mm in the sigmoid colon, removed, otherwise negative.      06/29/2016 Imaging    CT chest, abdomen and pelvis showed 2 indeterminant nodule in left and the right lung, 4-77mm, indeterminate a hypovascular liver lesions largest in the caudate lobe measuring 2.6 cm, mild abdominal lymphadenopathy in the gastrohepatic ligament and portocaval space.      07/02/2016 Imaging    Abdominal MRI with and without contrast showed 2 lobular lesions in the caudate lobe, and is regular lymph nodes in the gastrohepatic ligament which is adjacent to the liver lesion, suspicious for malignancy. 2 small lesions in the right hepatic lobe are indeterminate, but concerning for metastasis.      07/07/2016 Procedure    EGD was negative, EUS biopsy of the liver and adjacent lymph node      07/07/2016 Initial Diagnosis    Metastatic cholangiocarcinoma to lung ([place])      07/07/2016 Initial Biopsy    Fine-needle aspiration of the liver lesion in caudate lobe and adjacent lymph nodes both showed metastatic adenocarcinoma.      08/11/2016 -  Chemotherapy    Cisplatin 25 mg/m, gemcitabine 1000 mg/m, on day one and 8, every 21 days        10/18/2016 Imaging    CT CAP  IMPRESSION: 1. Again noted are multifocal liver metastasis. Overall there has been  no significant interval change in overall volume of liver metastases. 2. Similar appearance of upper abdominal adenopathy 3. Slight decrease in size of small pulmonary metastases.      01/25/2017 Imaging    CT CAP W Contrast 01/25/17 IMPRESSION: 1. Response to therapy. 2. Similar to minimal decrease in pulmonary nodules/metastasis. 3. Improvement in hepatic disease burden. 4. Decrease in abdominal adenopathy. 5. Significantly age advanced atherosclerosis, including within the coronary arteries. 6. Uterine fibroids. 7. Suspect a complex cyst in the left ovary. Recommend attention on follow-up.       04/13/2017 Imaging    CT CAP W Contrast IMPRESSION: 1. The dominant caudate lobe mass is stable in size and appearance, as is the indistinct adenopathy in the porta hepatis adjacent to the caudate lobe, and in the retroperitoneum. Several subtle additional liver lesions are slightly less conspicuous on today' s exam. The pulmonary nodules are stable, and mild omental nodularity in the left lower quadrant is very slightly worsened compared to prior. Overall the burden of malignancy appears similar to prior, and some of the findings may represent effectively treated metastatic lesions. 2. Complex left adnexal mass, 6.7 by 4.1 cm, enlarging. Pelvic sonography recommended for further characterization. 3. Other imaging findings of potential clinical significance: Aortic Atherosclerosis (ICD10-I70.0). Coronary atherosclerosis. Prominent stool throughout the colon favors constipation. Fibroid uterus.       HISTORY OF PRESENTING ILLNESS: 07/28/16 Nancy [name] 51 y.o. female is here because of Her recently diagnosed metastatic glandular  carcinoma. She is accompanied by her husband and mother to my clinic today.  She has been having RUQ abdominal pain since mid 04/2016, she was seen by PCP and was treated for gas with GI cocktail and pepcide, which she did not help much. Her pain got  worse, and radiates to right shoulder, she denies significant nausea, or bloating, BM normal, no fever, cough or dyspnea. She recently noticed mild chest discomfort in the low sternum area. She initially had the lab, ultrasound done by her primary care physician, which was unrevealing. She was referred to GI Dr. Hilarie Fredrickson and underwent EGD and CT scan, which showed a lobulated mass in the caudate lobe of liver, with at adjacent adenopathy. He underwent EUS and fine-needle biopsy of the liver mass and lymph nodes, all reviewed adenocarcinoma.  She has lost about 7lbs in the past 3 months. She has mild fatigue, but able to function at home. She has stopped working due to her abdominal pain and fatigue. She takes Norco every 1-3 times a day, and her pain seems not well controlled.  CURRENT THERAPY: Cisplatin and gemcitabine on Day 1, 8 every 21 days, with neulasta on day 9, Gemcitabine dosed reduced to 800 mg/m due to cytopenias, increased to 900mg /m2 on 12/06/2016, changed to maintenance gemcitabine from 04/19/2017  INTERIM HISTORY:  Nancy [name] returns for follow up. She states, "I'm better today than yesterday". She feels alright physically and hiked 4 miles on Saturday. Her energy is a lot better. Appetite is good. Denies fever or bowel disturbances. She does report more noticeable abdominal pain/discomfort in the last 1.5 to 2 weeks. This pain is localized underneath her breast and wraps around to the lower back. This becomes more intense as the evening progresses. This pain is daily and she has been taking one Norco every night to sleep. She has also noticed more bloating occasionally and isn't sure if it is related to diet. She has been working from home on a part time basis since last week and states this has helped her mentally.   She is anxious about her CT scan results. CT scan 04/13/17 showed stable disease overall.    MEDICAL HISTORY:  Past Medical History:  Diagnosis Date  . Allergy    SEASONAL  .  Anemia    HIGH SCHOOL  . Atopic eczema   . cholangio ca dx'd 07/12/16  . Fibrocystic breast disease   . GERD (gastroesophageal reflux disease)   . Hx of migraines    seasonal   . Pneumonia    6 years ago   . TIA (transient ischemic attack)     SURGICAL HISTORY: Past Surgical History:  Procedure Laterality Date  . EUS N/A 07/07/2016   Procedure: UPPER ENDOSCOPIC ULTRASOUND (EUS) RADIAL;  Surgeon: [name], MD;  Location: [facility] ENDOSCOPY;  Service: Endoscopy;  Laterality: N/A;  . IR GENERIC HISTORICAL  08/03/2016   IR US GUIDE VASC ACCESS RIGHT 08/03/2016 Corrie Mckusick, DO [facility]  . IR GENERIC HISTORICAL  08/03/2016   IR FLUORO GUIDE PORT INSERTION RIGHT 08/03/2016 Corrie Mckusick, DO [facility]  . MYOMECTOMY  2002    SOCIAL HISTORY: Social History   Social History  . Marital status: Married    Spouse name: N/A  . Number of children: 0  . Years of education: 64   Occupational History  . [employer]  .  [employer]   Social History Main Topics  . Smoking status: Never Smoker  . Smokeless tobacco:  Never Used  . Alcohol use Yes     Comment: maybe 3 times a year  . Drug use: No  . Sexual activity: Yes    Partners: Male   Other Topics Concern  . Not on file   Social History Narrative  . No narrative on file    FAMILY HISTORY: Family History  Problem Relation Age of Onset  . Gout Mother   . Diabetes Mother   . Hypertension Father   . Diabetes Father   . Breast cancer Paternal Aunt   . Cancer Maternal Grandfather        throat cancer   . Cancer Cousin 7       GI cancer   . Thyroid disease Neg Hx     ALLERGIES:  is allergic to penicillins and lipitor [atorvastatin].  MEDICATIONS:  Current Outpatient Prescriptions  Medication Sig Dispense Refill  . ALPRAZolam (XANAX) 0.5 MG tablet Take 1 tablet (0.5 mg total) by mouth as needed for anxiety. 10 tablet 0  . aspirin 81 MG tablet Take 81 mg by mouth daily.    . cetirizine (ZYRTEC) 10 MG tablet  Take 10 mg by mouth daily as needed. For allergies     . Cholecalciferol (VITAMIN D3) 5000 UNITS CAPS Take 1 capsule by mouth daily.    . COLOSTRUM PO Take 1.25 g by mouth 2 (two) times daily. 1.25 grams twice daily    . dicyclomine (BENTYL) 20 MG tablet 3 (three) times daily as needed.     . dronabinol (MARINOL) 5 MG capsule Take 1 capsule (5 mg total) by mouth 2 (two) times daily before a meal. 60 capsule 1  . HYDROcodone-acetaminophen (NORCO) 5-325 MG tablet Take 1-2 tablets by mouth every 6 (six) hours as needed for severe pain. 60 tablet 0  . lidocaine-prilocaine (EMLA) cream Apply 1 application topically as needed. Apply to portacath 1 1/2 hours - 2 hours prior to procedures as needed. 30 g 1  . meclizine (ANTIVERT) 50 MG tablet Take 1 tablet (50 mg total) by mouth 3 (three) times daily as needed. 30 tablet 0  . Menaquinone-7 (VITAMIN K2) 40 MCG TABS Take by mouth.    . mometasone (ELOCON) 0.1 % lotion Apply topically daily. 60 mL 0  . omeprazole (PRILOSEC) 40 MG capsule Take 1 capsule (40 mg total) by mouth 2 (two) times daily before a meal. 60 capsule 11  . ondansetron (ZOFRAN) 8 MG tablet Take 1 tablet (8 mg total) by mouth every 8 (eight) hours as needed for nausea or vomiting. 20 tablet 1  . Prenatal Vit-Fe Fumarate-FA (PRENATAL MULTIVITAMIN) TABS tablet Take 2 tablets by mouth daily at 12 noon.    . triamcinolone cream (KENALOG) 0.1 % Apply topically.    Marland Kitchen VITAMIN K PO Take 1 capsule by mouth daily.     No current facility-administered medications for this visit.    Facility-Administered Medications Ordered in Other Visits  Medication Dose Route Frequency Provider Last Rate Last Dose  . alteplase (CATHFLO ACTIVASE) injection 2 mg  2 mg Intracatheter Once PRN Truitt Merle, MD      . heparin lock flush 100 unit/mL  250 Units Intracatheter Once PRN Truitt Merle, MD      . sodium chloride flush (NS) 0.9 % injection 10 mL  10 mL Intracatheter PRN Truitt Merle, MD   10 mL at 09/09/16 1645  .  sodium chloride flush (NS) 0.9 % injection 10 mL  10 mL Intravenous PRN Truitt Merle, MD  10 mL at 02/11/17 1515  . sodium chloride flush (NS) 0.9 % injection 10 mL  10 mL Intracatheter PRN Truitt Merle, MD   10 mL at 02/21/17 1823    REVIEW OF SYSTEMS:  Constitutional: Denies fevers, chills or abnormal night sweats  Eyes: Denies blurriness of vision, double vision or watery eyes Ears, nose, mouth, throat, and face: Denies mucositis or sore throat Respiratory: Denies dyspnea or wheezes   Cardiovascular: Denies palpitation, chest discomfort or lower extremity swelling Gastrointestinal:  Denies heartburn or change in bowel habits  (+) abdominal discomfort/pain over the last 1.5-2 weeks. (+) occasional bloating Skin: Denies abnormal skin rashes  Lymphatics: Denies new lymphadenopathy or easy bruising Neurological:Denies numbness, tingling or new weaknesses Behavioral/Psych: Mood is stable, no new changes  (+) anxiety  Musculoskeletal:  All other systems were reviewed with the patient and are negative.  PHYSICAL EXAMINATION:  ECOG PERFORMANCE STATUS: 1 - Symptomatic but completely ambulatory  Vitals:   04/18/17 0908  BP: 139/81  Pulse: 71  Resp: 18  Temp: 98.6 F (37 C)  SpO2: 100%   GENERAL:alert, no distress and comfortable SKIN: skin color, texture, turgor are normal, no rashes or significant lesions, several hematomas on extremities  EYES: normal, conjunctiva are pink and non-injected, sclera clear OROPHARYNX:no exudate, no erythema and lips, buccal mucosa NECK: supple, thyroid normal size, non-tender, without nodularity LYMPH:  no palpable lymphadenopathy in the cervical, axillary or inguinal LUNGS: clear to auscultation and percussion with normal breathing effort HEART: regular rate & rhythm and no murmurs and no lower extremity edema ABDOMEN:abdomen soft, non-tender and normal bowel sounds Musculoskeletal:no cyanosis of digits and no clubbing  PSYCH: alert & oriented x 3 with  fluent speech NEURO: no focal motor/sensory deficits  LABORATORY DATA:  I have reviewed the data as listed CBC Latest Ref Rng & Units 04/18/2017 04/06/2017 03/30/2017  WBC 3.9 - 10.3 10e3/uL 2.9(L) 3.0(L) 3.7(L)  Hemoglobin 11.6 - 15.9 g/dL 10.6(L) 10.8(L) 10.9(L)  Hematocrit 34.8 - 46.6 % 32.9(L) 32.7(L) 33.2(L)  Platelets 145 - 400 10e3/uL 208 148 238   CMP Latest Ref Rng & Units 04/18/2017 04/06/2017 03/30/2017  Glucose 70 - 140 mg/dl 92 98 88  BUN 7.0 - 26.0 mg/dL 13.5 13.1 12.2  Creatinine 0.6 - 1.1 mg/dL 0.7 0.7 0.7  Sodium 136 - 145 mEq/L 140 141 142  Potassium 3.5 - 5.1 mEq/L 3.9 4.1 3.9  Chloride 101 - 111 mmol/L - - -  CO2 22 - 29 mEq/L 28 26 26   Calcium 8.4 - 10.4 mg/dL 9.3 9.3 9.2  Total Protein 6.4 - 8.3 g/dL 6.5 6.8 6.8  Total Bilirubin 0.20 - 1.20 mg/dL 0.29 0.27 0.27  Alkaline Phos 40 - 150 U/L 42 49 54  AST 5 - 34 U/L 21 18 24   ALT 0 - 55 U/L 21 19 22    ANC 1.4 today   CA19.9 (0-35U/ml) 07/28/2016: 36 08/18/2016: 51 09/13/2016: 56 10/19/2016: 43 11/09/2016: 44 12/06/2016: 41 01/05/2017: 35 02/02/2017: 37 03/07/17: 43 04/18/2017: 66   PATHOLOGY REPORT  Diagnosis 07/07/2016 FINE NEEDLE ASPIRATION, ENDOSCOPIC, LIVER (SPECIMEN 2 OF 2 COLLECTED 07/07/16): MALIGNANT CELLS CONSISTENT WITH ADENOCARCINOMA.  ADDITIONAL INFORMATION: Per request, immunohistochemistry was performed. The cells are positive for cytokeratin 7 and negative for cytokeratin 20 and CDX-2. This excludes a colorectal primary. Pancreaticobiliary and upper GI sources remain in the differential. Called to Dr. Burr Medico on 07/28/2016.  Diagnosis 07/07/2016 FINE NEEDLE ASPIRATION, ENDOSCOPIC, GASTRO-HEPATIC LYMPH NODE (SPECIMEN 1 OF 2 COLLECTED 07/07/16): MALIGNANT CELLS CONSISTENT WITH ADENOCARCINOMA,  SEE COMMENT. Preliminary Diagnosis Intraoperative Diagnosis: Few atypical glandular clusters - recommend additional material. 4) Adequate ([facility])  RADIOGRAPHIC STUDIES: I have personally reviewed the radiological images  as listed and agreed with the findings in the report.  CT CAP with contrast 04/13/2017 IMPRESSION: 1. The dominant caudate lobe mass is stable in size and appearance, as is the indistinct adenopathy in the porta hepatis adjacent to the caudate lobe, and in the retroperitoneum. Several subtle additional liver lesions are slightly less conspicuous on today' s exam. The pulmonary nodules are stable, and mild omental nodularity in the left lower quadrant is very slightly worsened compared to prior. Overall the burden of malignancy appears similar to prior, and some of the findings may represent effectively treated metastatic lesions. 2. Complex left adnexal mass, 6.7 by 4.1 cm, enlarging. Pelvic sonography recommended for further characterization. 3. Other imaging findings of potential clinical significance: Aortic Atherosclerosis (ICD10-I70.0). Coronary atherosclerosis. Prominent stool throughout the colon favors constipation. Fibroid uterus.  CT CAP W Contrast 01/25/17 IMPRESSION: 1. Response to therapy. 2. Similar to minimal decrease in pulmonary nodules/metastasis. 3. Improvement in hepatic disease burden. 4. Decrease in abdominal adenopathy. 5. Significantly age advanced atherosclerosis, including within the coronary arteries. 6. Uterine fibroids. 7. Suspect a complex cyst in the left ovary. Recommend attention on follow-up.  CT CAP W CONTRAST 10/18/16 IMPRESSION: 1. Again noted are multifocal liver metastasis. Overall there has been no significant interval change in overall volume of liver metastases. 2. Similar appearance of upper abdominal adenopathy 3. Slight decrease in size of small pulmonary metastases.  Upper EUS 07/07/2016 Dr. Ardis Hughs  - Endoscopically normal UGI tract (good views with standard gastroscope) - Vaguely bordered 4cm mass in the caudate lobe of liver that is confluent with what appear to be enlarged gastrohepatic lymphnodes (these may represent direct tumor  extension however). Preliminary cytology from the lymphnodes shows malignancy, glandular. Preliminary cytology from the liver mass shows the same. Await final cytology results but this is most suspicious for peripheral intrahepatic cholangiocarcinoma with adjacent malignant adenopathy.  COLONOSCOPY 05/22/2014 ENDOSCOPIC IMPRESSION: 1. Sessile polyp measuring 4 mm in size was found in the sigmoid colon; polypectomy was performed with a cold snare 2. The colon mucosa was otherwise normal  ASSESSMENT & PLAN:  51 y.o. African-American female, without significant past medical history, presented with abdominal pain  1. Metastatic intrahepatic cholangiocarcinoma, with probable node and lung metastasis, [id number], stage IV -I previously reviewed her CT, MRI, endoscopy findings and her biopsy results with patient and her family members in details. -We have previously reviewed her case in our GI tumor board. Based on the scans and endoscopy findings, this is most consistent with cholangiocarcinoma. Her liver and inguinal biopsy showed adenocarcinoma, IHC was consistent with pancreatic-biliary primary -She has 2 additional small liver lesions and 2 4-42mm lung lesions, which are indeterminate but that is suspicious for metastatic disease. -She was seen by surgeon Dr. Barry Dienes who thinks she is a not a candidate for surgical resection due to her metastatic disease. -We previously discussed her cancer is incurable at this stage, the goal of therapy is palliation, to prolong life and improve her quality life -she is on first-line cisplatin and gemcitabine, on day 1, 8 every 21 days.  -She has been tolerating chemotherapy moderately well, but had significant neutropenia and thrombocytopenia, cycle 2 chemotherapy was dose reduced -I previously reviewed her restaging CT scan from 10/19/2016, which showed stable disease overall, no other new lesions. She is clinically doing well, her abdominal pain has  much  improved. -CT C/A/P from 5/29 previously reviewed with pt and it shows excellent partical response to treatment, both liver lesions and adenopathy have decreased in size, small lung nodules are stable, no new lesions.  -CT C/A/P from 8/15 reviewed with the patient and it shows dominant caudate lobe mass is stable in size and appearance, as is the indistinct adenopathy in the porta hepatis adjacent to the caudate lobe, and in the retroperitoneum. Several subtle additional liver lesions are slightly less conspicuous on today's exam. The pulmonary nodules are stable, and mild omental nodularity in the left lower quadrant is very slightly worsened compared to prior. Overall the burden of malignancy appears similar to prior, and some of the findings may represent effectively treated metastatic lesions. -However her tumor marker CA19.9 has been slightly trending up, and she also has mildly worsening abdominal pain, concerning for earlier this is a progression. -She has been on cisplatin and gemcitabine for 10 months, developed worsening cytopenias. We discussed alternative chemotherapy regimens based on current symptoms. This included genuine to maintenance gemcitabine alone or FOLFOX. After lengthy discussion, we decided to change to maintenance gemcitabine, for better tolerance. If she does have evidence of disease progression on next restaging scan, I'll change her treatment to FOLFOX. -labs reviewed and adequate for treatment. She will continue with gemcitabine alone tomorrow -She will receive granix the day after treatment   2. Abdominal pain/discomfort, worsening -Abdominal pain/discomfort noticeable over the last 1.5-2 weeks. -She has been taking one Norco at night to sleep  3. Nausea and anorexia and weight loss  -We previously reviewed her nausea management, she will continue Compazine and Zofran -I previously encouraged her to take a nutritional supplement -We previously discssed appetite  stimulant, she is on Marinol, dose was increased to 5 mg twice daily last week, appetite improved, will continue. -she will f/u with dietician  -Previously encouraged her to have a high calorie, high protein diet.  4. Neutropenia and anemia -Secondary to chemotherapy, neutropenia much improved with neulasta. -She is taking pre-natal vitamins that contain B-12 which is helpful -Continue monitoring  5. Goal of care discussion  -We previously discussed the incurable nature of her cancer, and the overall poor prognosis, especially if she does not have good response to chemotherapy or progress on chemo -The patient understands the goal of care is palliative. -I have previously recommended DNR/DNI, she will think about it.   Plan -Lab reviewed, adequate for treatment, we'll change her chemo to maintenance gemcitabine alone tomorrow, day 1 and 8 every 21 days with Granix the day after treatment -F/u on 05/03/2017 before next cycle chemo    All questions were answered. The patient knows to call the clinic with any problems, questions or concerns.  I spent 20 minutes counseling the patient face to face. The total time spent in the appointment was 25 minutes and more than 50% was on counseling.  This document serves as a record of services personally performed by Truitt Merle, MD. It was created on her behalf by Arlyce Harman, a trained medical scribe. The creation of this record is based on the scribe's personal observations and the provider's statements to them. This document has been checked and approved by the attending provider.  I have reviewed the above documentation for accuracy and completeness and I agree with the above.   Truitt Merle, MD 04/18/2017

## 2017-04-15 NOTE — Progress Notes (Addendum)
[place]  Telephone:(336) [phone] Fax:(336) [phone]  Clinic Follow Up Note   Patient Care Team: Carollee Herter, Alferd Apa, DO as PCP - General Pyrtle, Lajuan Lines, MD as Consulting Physician (Gastroenterology) 04/25/2017  CHIEF COMPLAINTS:  Follow up metastatic intrahepatic cholangiocarcinoma  Oncology History   Metastatic cholangiocarcinoma to lung [facility])   Staging form: Perihilar Bile Ducts, AJCC 7th Edition   - Clinical stage from 07/07/2016: Stage IVB (T2b, N1, M1) - Signed by Truitt Merle, MD on 07/28/2016      Metastatic cholangiocarcinoma to lung ([place])   05/22/2014 Procedure    Routine screening colonoscopy showed a sessile polyp measuring 4 mm in the sigmoid colon, removed, otherwise negative.      06/29/2016 Imaging    CT chest, abdomen and pelvis showed 2 indeterminant nodule in left and the right lung, 4-43mm, indeterminate a hypovascular liver lesions largest in the caudate lobe measuring 2.6 cm, mild abdominal lymphadenopathy in the gastrohepatic ligament and portocaval space.      07/02/2016 Imaging    Abdominal MRI with and without contrast showed 2 lobular lesions in the caudate lobe, and is regular lymph nodes in the gastrohepatic ligament which is adjacent to the liver lesion, suspicious for malignancy. 2 small lesions in the right hepatic lobe are indeterminate, but concerning for metastasis.      07/07/2016 Procedure    EGD was negative, EUS biopsy of the liver and adjacent lymph node      07/07/2016 Initial Diagnosis    Metastatic cholangiocarcinoma to lung ([place])      07/07/2016 Initial Biopsy    Fine-needle aspiration of the liver lesion in caudate lobe and adjacent lymph nodes both showed metastatic adenocarcinoma.      08/11/2016 -  Chemotherapy    Cisplatin 25 mg/m, gemcitabine 1000 mg/m, on day one and 8, every 21 days        10/18/2016 Imaging    CT CAP  IMPRESSION: 1. Again noted are multifocal liver metastasis. Overall there has been  no significant interval change in overall volume of liver metastases. 2. Similar appearance of upper abdominal adenopathy 3. Slight decrease in size of small pulmonary metastases.      01/25/2017 Imaging    CT CAP W Contrast 01/25/17 IMPRESSION: 1. Response to therapy. 2. Similar to minimal decrease in pulmonary nodules/metastasis. 3. Improvement in hepatic disease burden. 4. Decrease in abdominal adenopathy. 5. Significantly age advanced atherosclerosis, including within the coronary arteries. 6. Uterine fibroids. 7. Suspect a complex cyst in the left ovary. Recommend attention on follow-up.       04/13/2017 Imaging    CT CAP W Contrast IMPRESSION: 1. The dominant caudate lobe mass is stable in size and appearance, as is the indistinct adenopathy in the porta hepatis adjacent to the caudate lobe, and in the retroperitoneum. Several subtle additional liver lesions are slightly less conspicuous on today' s exam. The pulmonary nodules are stable, and mild omental nodularity in the left lower quadrant is very slightly worsened compared to prior. Overall the burden of malignancy appears similar to prior, and some of the findings may represent effectively treated metastatic lesions. 2. Complex left adnexal mass, 6.7 by 4.1 cm, enlarging. Pelvic sonography recommended for further characterization. 3. Other imaging findings of potential clinical significance: Aortic Atherosclerosis (ICD10-I70.0). Coronary atherosclerosis. Prominent stool throughout the colon favors constipation. Fibroid uterus.       HISTORY OF PRESENTING ILLNESS: 07/28/16 Nancy [name] 51 y.o. female is here because of Her recently diagnosed metastatic glandular  carcinoma. She is accompanied by her husband and mother to my clinic today.  She has been having RUQ abdominal pain since mid 04/2016, she was seen by PCP and was treated for gas with GI cocktail and pepcide, which she did not help much. Her pain got  worse, and radiates to right shoulder, she denies significant nausea, or bloating, BM normal, no fever, cough or dyspnea. She recently noticed mild chest discomfort in the low sternum area. She initially had the lab, ultrasound done by her primary care physician, which was unrevealing. She was referred to GI Dr. Hilarie Fredrickson and underwent EGD and CT scan, which showed a lobulated mass in the caudate lobe of liver, with at adjacent adenopathy. He underwent EUS and fine-needle biopsy of the liver mass and lymph nodes, all reviewed adenocarcinoma.  She has lost about 7lbs in the past 3 months. She has mild fatigue, but able to function at home. She has stopped working due to her abdominal pain and fatigue. She takes Norco every 1-3 times a day, and her pain seems not well controlled.  CURRENT THERAPY: Cisplatin and gemcitabine on Day 1, 8 every 21 days, with neulasta on day 9, Gemcitabine dosed reduced to 800 mg/m due to cytopenias, increased to 900mg /m2 on 12/06/2016. Changes to gemcitabine maintenance therapy on day 1 and 8 every 21 days, from 04/18/2017  INTERIM HISTORY:  Nancy [name] returns for follow up and is accompanied by her husband. She is doing well overall, she still has intermittent back pain, sometime in the middle back sometime in the low back, especially at the end of the day. She does take hydrocodone once at night. She otherwise doing well, denies any fever, chills, nausea, or other complaints. She tolerated the single agent gemcitabine better, less appetite issue. She has recovered well. She did not get the granix injection last week after chemotherapy.   MEDICAL HISTORY:  Past Medical History:  Diagnosis Date  . Allergy    SEASONAL  . Anemia    HIGH SCHOOL  . Atopic eczema   . cholangio ca dx'd 07/12/16  . Fibrocystic breast disease   . GERD (gastroesophageal reflux disease)   . Hx of migraines    seasonal   . Pneumonia    6 years ago   . TIA (transient ischemic attack)     SURGICAL  HISTORY: Past Surgical History:  Procedure Laterality Date  . EUS N/A 07/07/2016   Procedure: UPPER ENDOSCOPIC ULTRASOUND (EUS) RADIAL;  Surgeon: [name], MD;  Location: [facility];  Service: Endoscopy;  Laterality: N/A;  . IR GENERIC HISTORICAL  08/03/2016   IR US GUIDE VASC ACCESS RIGHT 08/03/2016 Corrie Mckusick, DO [facility]  . IR GENERIC HISTORICAL  08/03/2016   IR FLUORO GUIDE PORT INSERTION RIGHT 08/03/2016 Corrie Mckusick, DO [facility]  . MYOMECTOMY  2002    SOCIAL HISTORY: Social History   Social History  . Marital status: Married    Spouse name: N/A  . Number of children: 0  . Years of education: 78   Occupational History  . [employer]  .  [employer]   Social History Main Topics  . Smoking status: Never Smoker  . Smokeless tobacco: Never Used  . Alcohol use Yes     Comment: maybe 3 times a year  . Drug use: No  . Sexual activity: Yes    Partners: Male   Other Topics Concern  . Not on file   Social History Narrative  . No  narrative on file    FAMILY HISTORY: Family History  Problem Relation Age of Onset  . Gout Mother   . Diabetes Mother   . Hypertension Father   . Diabetes Father   . Breast cancer Paternal Aunt   . Cancer Maternal Grandfather        throat cancer   . Cancer Cousin 38       GI cancer   . Thyroid disease Neg Hx     ALLERGIES:  is allergic to penicillins and lipitor [atorvastatin].  MEDICATIONS:  Current Outpatient Prescriptions  Medication Sig Dispense Refill  . ALPRAZolam (XANAX) 0.5 MG tablet Take 1 tablet (0.5 mg total) by mouth as needed for anxiety. 10 tablet 0  . aspirin 81 MG tablet Take 81 mg by mouth daily.    . cetirizine (ZYRTEC) 10 MG tablet Take 10 mg by mouth daily as needed. For allergies     . Cholecalciferol (VITAMIN D3) 5000 UNITS CAPS Take 1 capsule by mouth daily.    . COLOSTRUM PO Take 1.25 g by mouth 2 (two) times daily. 1.25 grams twice daily    . dicyclomine (BENTYL) 20 MG tablet 3 (three)  times daily as needed.     . dronabinol (MARINOL) 5 MG capsule Take 1 capsule (5 mg total) by mouth 2 (two) times daily before a meal. 60 capsule 1  . HYDROcodone-acetaminophen (NORCO) 5-325 MG tablet Take 1-2 tablets by mouth every 6 (six) hours as needed for severe pain. 60 tablet 0  . lidocaine-prilocaine (EMLA) cream Apply 1 application topically as needed. Apply to portacath 1 1/2 hours - 2 hours prior to procedures as needed. 30 g 1  . meclizine (ANTIVERT) 50 MG tablet Take 1 tablet (50 mg total) by mouth 3 (three) times daily as needed. 30 tablet 0  . Menaquinone-7 (VITAMIN K2) 40 MCG TABS Take by mouth.    . mometasone (ELOCON) 0.1 % lotion Apply topically daily. 60 mL 0  . omeprazole (PRILOSEC) 40 MG capsule Take 1 capsule (40 mg total) by mouth 2 (two) times daily before a meal. 60 capsule 11  . ondansetron (ZOFRAN) 8 MG tablet Take 1 tablet (8 mg total) by mouth every 8 (eight) hours as needed for nausea or vomiting. 20 tablet 1  . Prenatal Vit-Fe Fumarate-FA (PRENATAL MULTIVITAMIN) TABS tablet Take 2 tablets by mouth daily at 12 noon.    . triamcinolone cream (KENALOG) 0.1 % Apply topically.    Marland Kitchen VITAMIN K PO Take 1 capsule by mouth daily.     No current facility-administered medications for this visit.    Facility-Administered Medications Ordered in Other Visits  Medication Dose Route Frequency Provider Last Rate Last Dose  . alteplase (CATHFLO ACTIVASE) injection 2 mg  2 mg Intracatheter Once PRN Truitt Merle, MD      . heparin lock flush 100 unit/mL  250 Units Intracatheter Once PRN Truitt Merle, MD      . sodium chloride flush (NS) 0.9 % injection 10 mL  10 mL Intracatheter PRN Truitt Merle, MD   10 mL at 09/09/16 1645  . sodium chloride flush (NS) 0.9 % injection 10 mL  10 mL Intravenous PRN Truitt Merle, MD   10 mL at 02/11/17 1515  . sodium chloride flush (NS) 0.9 % injection 10 mL  10 mL Intracatheter PRN Truitt Merle, MD   10 mL at 02/21/17 1823    REVIEW OF SYSTEMS:  Constitutional:  Denies fevers, chills or abnormal night sweats  Eyes: Denies  blurriness of vision, double vision or watery eyes Ears, nose, mouth, throat, and face: Denies mucositis or sore throat Respiratory: Denies dyspnea or wheezes (+) occasionally dry cough  Cardiovascular: Denies palpitation, chest discomfort or lower extremity swelling Gastrointestinal:  Denies heartburn or change in bowel habits  (+) abdominal discomfort Skin: Denies abnormal skin rashes  Lymphatics: Denies new lymphadenopathy or easy bruising Neurological:Denies numbness, tingling or new weaknesses (+) "chemo brain" (+)  distibular migraine Behavioral/Psych: Mood is stable, no new changes  (+) anxiety  Musculoskeletal:  All other systems were reviewed with the patient and are negative.  PHYSICAL EXAMINATION:  ECOG PERFORMANCE STATUS: 1 - Symptomatic but completely ambulatory  03/30/17: BP: 147/85, Heart rate is 70, respiratory rate 18, pulse ox 100% on room air, temperature 36.6 GENERAL:alert, no distress and comfortable SKIN: skin color, texture, turgor are normal, no rashes or significant lesions, several hematomas on extremities  EYES: normal, conjunctiva are pink and non-injected, sclera clear OROPHARYNX:no exudate, no erythema and lips, buccal mucosa NECK: supple, thyroid normal size, non-tender, without nodularity LYMPH:  no palpable lymphadenopathy in the cervical, axillary or inguinal LUNGS: clear to auscultation and percussion with normal breathing effort HEART: regular rate & rhythm and no murmurs and no lower extremity edema ABDOMEN:abdomen soft, non-tender and normal bowel sounds Musculoskeletal:no cyanosis of digits and no clubbing  PSYCH: alert & oriented x 3 with fluent speech NEURO: no focal motor/sensory deficits  LABORATORY DATA:  I have reviewed the data as listed CBC Latest Ref Rng & Units 04/25/2017 04/18/2017 04/06/2017  WBC 3.9 - 10.3 10e3/uL 2.2(L) 2.9(L) 3.0(L)  Hemoglobin 11.6 - 15.9 g/dL 10.3(L) 10.6(L)  10.8(L)  Hematocrit 34.8 - 46.6 % 30.9(L) 32.9(L) 32.7(L)  Platelets 145 - 400 10e3/uL 180 208 148   CMP Latest Ref Rng & Units 04/25/2017 04/18/2017 04/06/2017  Glucose 70 - 140 mg/dl 90 92 98  BUN 7.0 - 26.0 mg/dL 11.7 13.5 13.1  Creatinine 0.6 - 1.1 mg/dL 0.7 0.7 0.7  Sodium 136 - 145 mEq/L 141 140 141  Potassium 3.5 - 5.1 mEq/L 4.0 3.9 4.1  Chloride 101 - 111 mmol/L - - -  CO2 22 - 29 mEq/L 28 28 26   Calcium 8.4 - 10.4 mg/dL 9.2 9.3 9.3  Total Protein 6.4 - 8.3 g/dL 6.7 6.5 6.8  Total Bilirubin 0.20 - 1.20 mg/dL <0.22 0.29 0.27  Alkaline Phos 40 - 150 U/L 41 42 49  AST 5 - 34 U/L 23 21 18   ALT 0 - 55 U/L 24 21 19    ANC 1.6 today   CA19.9 (0-35U/ml) 07/28/2016: 36 08/18/2016: 51 09/13/2016: 56 10/19/2016: 43 11/09/2016: 44 12/06/2016: 41 01/05/2017: 35 02/02/2017: 37 03/07/17: 43   PATHOLOGY REPORT  Diagnosis 07/07/2016 FINE NEEDLE ASPIRATION, ENDOSCOPIC, LIVER (SPECIMEN 2 OF 2 COLLECTED 07/07/16): MALIGNANT CELLS CONSISTENT WITH ADENOCARCINOMA.  ADDITIONAL INFORMATION: Per request, immunohistochemistry was performed. The cells are positive for cytokeratin 7 and negative for cytokeratin 20 and CDX-2. This excludes a colorectal primary. Pancreaticobiliary and upper GI sources remain in the differential. Called to Dr. Burr Medico on 07/28/2016.  Diagnosis 07/07/2016 FINE NEEDLE ASPIRATION, ENDOSCOPIC, GASTRO-HEPATIC LYMPH NODE (SPECIMEN 1 OF 2 COLLECTED 07/07/16): MALIGNANT CELLS CONSISTENT WITH ADENOCARCINOMA, SEE COMMENT. Preliminary Diagnosis Intraoperative Diagnosis: Few atypical glandular clusters - recommend additional material. 4) Adequate ([facility])  RADIOGRAPHIC STUDIES: I have personally reviewed the radiological images as listed and agreed with the findings in the report.  CT CAP W Contrast, 04/13/17 IMPRESSION: 1. The dominant caudate lobe mass is stable in size and appearance,  as is the indistinct adenopathy in the porta hepatis adjacent to the caudate lobe, and in the  retroperitoneum. Several subtle additional liver lesions are slightly less conspicuous on today' s exam. The pulmonary nodules are stable, and mild omental nodularity in the left lower quadrant is very slightly worsened compared to prior. Overall the burden of malignancy appears similar to prior, and some of the findings may represent effectively treated metastatic lesions. 2. Complex left adnexal mass, 6.7 by 4.1 cm, enlarging. Pelvic sonography recommended for further characterization. 3. Other imaging findings of potential clinical significance: Aortic Atherosclerosis (ICD10-I70.0). Coronary atherosclerosis. Prominent stool throughout the colon favors constipation. Fibroid uterus.  CT CAP W Contrast 01/25/17 IMPRESSION: 1. Response to therapy. 2. Similar to minimal decrease in pulmonary nodules/metastasis. 3. Improvement in hepatic disease burden. 4. Decrease in abdominal adenopathy. 5. Significantly age advanced atherosclerosis, including within the coronary arteries. 6. Uterine fibroids. 7. Suspect a complex cyst in the left ovary. Recommend attention on follow-up.  CT CAP W CONTRAST 10/18/16 IMPRESSION: 1. Again noted are multifocal liver metastasis. Overall there has been no significant interval change in overall volume of liver metastases. 2. Similar appearance of upper abdominal adenopathy 3. Slight decrease in size of small pulmonary metastases.  Upper EUS 07/07/2016 Dr. Ardis Hughs  - Endoscopically normal UGI tract (good views with standard gastroscope) - Vaguely bordered 4cm mass in the caudate lobe of liver that is confluent with what appear to be enlarged gastrohepatic lymphnodes (these may represent direct tumor extension however). Preliminary cytology from the lymphnodes shows malignancy, glandular. Preliminary cytology from the liver mass shows the same. Await final cytology results but this is most suspicious for peripheral intrahepatic cholangiocarcinoma with  adjacent malignant adenopathy.  COLONOSCOPY 05/22/2014 ENDOSCOPIC IMPRESSION: 1. Sessile polyp measuring 4 mm in size was found in the sigmoid colon; polypectomy was performed with a cold snare 2. The colon mucosa was otherwise normal  ASSESSMENT & PLAN:  51 y.o. African-American female, without significant past medical history, presented with abdominal pain  1. Metastatic intrahepatic cholangiocarcinoma, with probable node and lung metastasis, [id number], stage IV -I previously reviewed her CT, MRI, endoscopy findings and her biopsy results with patient and her family members in details. -We have previously reviewed her case in our GI tumor board. Based on the scans and endoscopy findings, this is most consistent with cholangiocarcinoma. Her liver and inguinal biopsy showed adenocarcinoma, IHC was consistent with pancreatic-biliary primary -She has 2 additional small liver lesions and 2 4-4mm lung lesions, which are indeterminate but that is suspicious for metastatic disease. -She was seen by surgeon Dr. Barry Dienes who thinks she is a not a candidate for surgical resection due to her metastatic disease. -We previously discussed her cancer is incurable at this stage, the goal of therapy is palliation, to prolong life and improve her quality life -she is on first-line cisplatin and gemcitabine, on day 1, 8 every 21 days.  -She has been tolerating chemotherapy moderately well, but had significant neutropenia and thrombocytopenia, cycle 2 chemotherapy was dose reduced -I previously reviewed her restaging CT scan from 10/19/2016, which showed stable disease overall, no other new lesions. She is clinically doing well, her abdominal pain has much improved. -CT from 5/29 reviewed with pt and it shows excellent partical response to treatment, both liver lesions and adenopathy have decreased in size, small lung nodules are stable, no new lesions.  -I previously reviewed her CT scan from 04/13/2017, which showed  a stable disease overall. Her tumor marker CA 19.9 has been  slowly trending up, concerning for early resistance. -She has developed prolonged neutropenia, secondary to chemotherapy. I have changed her treatment to gemcitabine maintenance therapy last week, she did not get granix, her ANC 0.6 today, we'll proceed with gemcitabine was reduced dose to 400 mg/m, with Neulasta tomorrow. -Due to her travel, we will postpone her next treatment to 3 weeks. -We plan to repeat CT scan in 2 weeks for close follow-up, likely we need to change her treatment in the near future.   2. Abdominal pain, back pain -continue hydrocodone/acetaminophen every 4-6 hours as needed for pain control, she takes one tablet at night on average -Her abdominal pain has nearly resolved after she started chemotherapy,seems slightly worse again lately -She now has developed mild intermittent back pain, possible muscular pain. -Constipation and management previously reviewed with patient -Continue monitoring.  3. Nausea and anorexia and weight loss  -We previously reviewed her nausea management, she will continue Compazine and Zofran -I previously encouraged her to take a nutritional supplement -We previously discussed appetite stimulant, she is on Marinol, dose was increased to 5 mg twice daily last week, appetite improved, will continue. -she will f/u with dietician  -Previously encouraged her to have a high calorie, high protein diet.  4. Neutropenia and anemia -Secondary to chemotherapy, neutropenia much improved with neulasta. -She is taking pre-natal vitamins that contain B-12 which is helpful -Continue monitoring  -She has persistent neutropenia from chemotherapy, we'll continue GCSF after chemo   5. Goal of care discussion  -We previously discussed the incurable nature of her cancer, and the overall poor prognosis, especially if she does not have good response to chemotherapy or progress on chemo -The patient  understands the goal of care is palliative. -I have previously recommended DNR/DNI, she will think about it.    Plan Lab reviewed, ANC 0.6, we'll reduce her gemcitabine to 400 mg/m, with Neulasta tomorrow -Due to her travel plan, we'll postpone her next treatment to 3 weeks -No refill needed this time.  All questions were answered. The patient knows to call the clinic with any problems, questions or concerns.  I spent 15 minutes counseling the patient face to face. The total time spent in the appointment was 20 minutes and more than 50% was on counseling.   This document serves as a record of services personally performed by Truitt Merle, MD. It was created on her behalf by Steva Colder, a trained medical scribe. The creation of this record is based on the scribe's personal observations and the provider's statements to them. This document has been checked and approved by the attending provider.   I have reviewed the above documentation for accuracy and completeness and I agree with the above.   Truitt Merle, MD 04/25/2017   Addendum The patient came in today for Neulasta injection, reports bilateral eye swelling, watery eyes and redness since last night. It lasts onto this morning, and gradually resolved today. She denies any fever, periphery vision, she does have mild headaches along with her eye symptoms. She denies any lip is swelling, shortness breath or other new symptoms. It happened exactly the same after her gemcitabine infusion last week, resolved next day. She did take allergy medication Claritin, without immediate improvement.  I saw her today, exam showed a mild conjunctivitis, no eye lid edema or other symptoms.   This is a possible allergy reaction to gemcitabine, although she previously tolerated gemcitabine and cisplatin well. I recommend her to consider switching to next line treatment FOLFOX, due  to her possible early resistance to current regimen, and her recent allergy  reaction, or re-charllenge gemcitabine one more cycle. She will think about it and let me know.   Pt also had many questions about her candidacy for surgical resection. She was previously seen by a Psychologist, sport and exercise at [facility]. Given her know metastatic disease (although it's controlled), I do not think she is a candidate for this surgical resection. But it will be reasonable to evaluate her disease status again by PET scan on next restaging scan, to see if she still has active extrahepatic disease. She agrees.   Truitt Merle  04/26/2017

## 2017-04-18 ENCOUNTER — Other Ambulatory Visit (HOSPITAL_BASED_OUTPATIENT_CLINIC_OR_DEPARTMENT_OTHER): Payer: Federal, State, Local not specified - PPO

## 2017-04-18 ENCOUNTER — Ambulatory Visit (HOSPITAL_BASED_OUTPATIENT_CLINIC_OR_DEPARTMENT_OTHER): Payer: Federal, State, Local not specified - PPO | Admitting: Hematology

## 2017-04-18 ENCOUNTER — Ambulatory Visit: Payer: Federal, State, Local not specified - PPO

## 2017-04-18 ENCOUNTER — Encounter: Payer: Self-pay | Admitting: Hematology

## 2017-04-18 VITALS — BP 139/81 | HR 71 | Temp 98.6°F | Resp 18 | Ht 66.0 in | Wt 141.0 lb

## 2017-04-18 DIAGNOSIS — C7801 Secondary malignant neoplasm of right lung: Secondary | ICD-10-CM | POA: Diagnosis not present

## 2017-04-18 DIAGNOSIS — Z7189 Other specified counseling: Secondary | ICD-10-CM | POA: Diagnosis not present

## 2017-04-18 DIAGNOSIS — C221 Intrahepatic bile duct carcinoma: Secondary | ICD-10-CM

## 2017-04-18 DIAGNOSIS — C787 Secondary malignant neoplasm of liver and intrahepatic bile duct: Secondary | ICD-10-CM

## 2017-04-18 DIAGNOSIS — T451X5A Adverse effect of antineoplastic and immunosuppressive drugs, initial encounter: Secondary | ICD-10-CM

## 2017-04-18 DIAGNOSIS — R109 Unspecified abdominal pain: Secondary | ICD-10-CM

## 2017-04-18 DIAGNOSIS — D6481 Anemia due to antineoplastic chemotherapy: Secondary | ICD-10-CM | POA: Diagnosis not present

## 2017-04-18 DIAGNOSIS — D701 Agranulocytosis secondary to cancer chemotherapy: Secondary | ICD-10-CM | POA: Diagnosis not present

## 2017-04-18 DIAGNOSIS — C7800 Secondary malignant neoplasm of unspecified lung: Secondary | ICD-10-CM

## 2017-04-18 LAB — CBC WITH DIFFERENTIAL/PLATELET
BASO%: 0.7 % (ref 0.0–2.0)
Basophils Absolute: 0 10*3/uL (ref 0.0–0.1)
EOS%: 2.1 % (ref 0.0–7.0)
Eosinophils Absolute: 0.1 10*3/uL (ref 0.0–0.5)
HCT: 32.9 % — ABNORMAL LOW (ref 34.8–46.6)
HGB: 10.6 g/dL — ABNORMAL LOW (ref 11.6–15.9)
LYMPH%: 45.1 % (ref 14.0–49.7)
MCH: 28.6 pg (ref 25.1–34.0)
MCHC: 32.2 g/dL (ref 31.5–36.0)
MCV: 88.7 fL (ref 79.5–101.0)
MONO#: 0.1 10*3/uL (ref 0.1–0.9)
MONO%: 3.5 % (ref 0.0–14.0)
NEUT#: 1.4 10*3/uL — ABNORMAL LOW (ref 1.5–6.5)
NEUT%: 48.6 % (ref 38.4–76.8)
Platelets: 208 10*3/uL (ref 145–400)
RBC: 3.71 10*6/uL (ref 3.70–5.45)
RDW: 14.4 % (ref 11.2–14.5)
WBC: 2.9 10*3/uL — ABNORMAL LOW (ref 3.9–10.3)
lymph#: 1.3 10*3/uL (ref 0.9–3.3)

## 2017-04-18 LAB — COMPREHENSIVE METABOLIC PANEL
ALT: 21 U/L (ref 0–55)
AST: 21 U/L (ref 5–34)
Albumin: 3.9 g/dL (ref 3.5–5.0)
Alkaline Phosphatase: 42 U/L (ref 40–150)
Anion Gap: 6 mEq/L (ref 3–11)
BUN: 13.5 mg/dL (ref 7.0–26.0)
CO2: 28 mEq/L (ref 22–29)
Calcium: 9.3 mg/dL (ref 8.4–10.4)
Chloride: 106 mEq/L (ref 98–109)
Creatinine: 0.7 mg/dL (ref 0.6–1.1)
EGFR: >90 mL/min/{1.73_m2} (ref >90)
Glucose: 92 mg/dl (ref 70–140)
Potassium: 3.9 mEq/L (ref 3.5–5.1)
Sodium: 140 mEq/L (ref 136–145)
Total Bilirubin: 0.29 mg/dL (ref 0.20–1.20)
Total Protein: 6.5 g/dL (ref 6.4–8.3)

## 2017-04-18 MED ORDER — SODIUM CHLORIDE 0.9% FLUSH
10.0000 mL | INTRAVENOUS | Status: DC | PRN
  Administered 2017-04-18: 10 mL
  Filled 2017-04-18: qty 10

## 2017-04-19 ENCOUNTER — Telehealth: Payer: Self-pay | Admitting: Hematology

## 2017-04-19 ENCOUNTER — Ambulatory Visit (HOSPITAL_BASED_OUTPATIENT_CLINIC_OR_DEPARTMENT_OTHER): Payer: Federal, State, Local not specified - PPO

## 2017-04-19 VITALS — BP 135/80 | HR 76 | Temp 98.7°F | Resp 17

## 2017-04-19 DIAGNOSIS — C7801 Secondary malignant neoplasm of right lung: Secondary | ICD-10-CM

## 2017-04-19 DIAGNOSIS — Z5111 Encounter for antineoplastic chemotherapy: Secondary | ICD-10-CM | POA: Diagnosis not present

## 2017-04-19 DIAGNOSIS — C221 Intrahepatic bile duct carcinoma: Secondary | ICD-10-CM

## 2017-04-19 LAB — CANCER ANTIGEN 19-9: CA 19-9: 66 U/mL — ABNORMAL HIGH (ref 0–35)

## 2017-04-19 MED ORDER — HEPARIN SOD (PORK) LOCK FLUSH 100 UNIT/ML IV SOLN
500.0000 [IU] | Freq: Once | INTRAVENOUS | Status: AC | PRN
  Administered 2017-04-19: 500 [IU]
  Filled 2017-04-19: qty 5

## 2017-04-19 MED ORDER — SODIUM CHLORIDE 0.9% FLUSH
10.0000 mL | INTRAVENOUS | Status: DC | PRN
  Administered 2017-04-19: 10 mL
  Filled 2017-04-19: qty 10

## 2017-04-19 MED ORDER — GEMCITABINE HCL CHEMO INJECTION 1 GM/26.3ML
800.0000 mg/m2 | Freq: Once | INTRAVENOUS | Status: AC
  Administered 2017-04-19: 1368 mg via INTRAVENOUS
  Filled 2017-04-19: qty 35.98

## 2017-04-19 MED ORDER — PROCHLORPERAZINE MALEATE 10 MG PO TABS
10.0000 mg | ORAL_TABLET | Freq: Once | ORAL | Status: AC
Start: 2017-04-19 — End: 2017-04-19
  Administered 2017-04-19: 10 mg via ORAL

## 2017-04-19 MED ORDER — PROCHLORPERAZINE MALEATE 10 MG PO TABS
ORAL_TABLET | ORAL | Status: AC
  Filled 2017-04-19: qty 1

## 2017-04-19 MED ORDER — SODIUM CHLORIDE 0.9 % IV SOLN
Freq: Once | INTRAVENOUS | Status: AC
  Administered 2017-04-19: 10:00:00 via INTRAVENOUS

## 2017-04-19 NOTE — Patient Instructions (Signed)
[place] Discharge Instructions for Patients Receiving Chemotherapy  Today you received the following chemotherapy agents:  Gemcitabine  To help prevent nausea and vomiting after your treatment, we encourage you to take your nausea medication.   If you develop nausea and vomiting that is not controlled by your nausea medication, call the clinic.   BELOW ARE SYMPTOMS THAT SHOULD BE REPORTED IMMEDIATELY:  *FEVER GREATER THAN 100.5 F  *CHILLS WITH OR WITHOUT FEVER  NAUSEA AND VOMITING THAT IS NOT CONTROLLED WITH YOUR NAUSEA MEDICATION  *UNUSUAL SHORTNESS OF BREATH  *UNUSUAL BRUISING OR BLEEDING  TENDERNESS IN MOUTH AND THROAT WITH OR WITHOUT PRESENCE OF ULCERS  *URINARY PROBLEMS  *BOWEL PROBLEMS  UNUSUAL RASH Items with * indicate a potential emergency and should be followed up as soon as possible.  Feel free to call the clinic you have any questions or concerns. The clinic phone number is [phone].  Please show the [place] at check-in to the Emergency Department and triage nurse.  Come @ 4:30 pm tomorrow for neulasta Call for any symptoms of infection.

## 2017-04-19 NOTE — Telephone Encounter (Signed)
Scheduled appt per 8/20 los - patient is aware .

## 2017-04-20 ENCOUNTER — Ambulatory Visit: Payer: Federal, State, Local not specified - PPO

## 2017-04-23 ENCOUNTER — Encounter: Payer: Self-pay | Admitting: Hematology

## 2017-04-25 ENCOUNTER — Encounter: Payer: Self-pay | Admitting: Hematology

## 2017-04-25 ENCOUNTER — Ambulatory Visit: Payer: Federal, State, Local not specified - PPO

## 2017-04-25 ENCOUNTER — Telehealth: Payer: Self-pay | Admitting: Hematology

## 2017-04-25 ENCOUNTER — Ambulatory Visit (HOSPITAL_BASED_OUTPATIENT_CLINIC_OR_DEPARTMENT_OTHER): Payer: Federal, State, Local not specified - PPO | Admitting: Hematology

## 2017-04-25 ENCOUNTER — Ambulatory Visit (HOSPITAL_BASED_OUTPATIENT_CLINIC_OR_DEPARTMENT_OTHER): Payer: Federal, State, Local not specified - PPO

## 2017-04-25 ENCOUNTER — Other Ambulatory Visit (HOSPITAL_BASED_OUTPATIENT_CLINIC_OR_DEPARTMENT_OTHER): Payer: Federal, State, Local not specified - PPO

## 2017-04-25 VITALS — BP 145/77 | HR 75 | Temp 97.8°F | Resp 18 | Ht 66.0 in | Wt 139.1 lb

## 2017-04-25 DIAGNOSIS — C221 Intrahepatic bile duct carcinoma: Secondary | ICD-10-CM

## 2017-04-25 DIAGNOSIS — Z5189 Encounter for other specified aftercare: Secondary | ICD-10-CM

## 2017-04-25 DIAGNOSIS — C787 Secondary malignant neoplasm of liver and intrahepatic bile duct: Secondary | ICD-10-CM

## 2017-04-25 DIAGNOSIS — T451X5A Adverse effect of antineoplastic and immunosuppressive drugs, initial encounter: Secondary | ICD-10-CM

## 2017-04-25 DIAGNOSIS — D701 Agranulocytosis secondary to cancer chemotherapy: Secondary | ICD-10-CM | POA: Diagnosis not present

## 2017-04-25 DIAGNOSIS — D6481 Anemia due to antineoplastic chemotherapy: Secondary | ICD-10-CM | POA: Diagnosis not present

## 2017-04-25 DIAGNOSIS — C7801 Secondary malignant neoplasm of right lung: Secondary | ICD-10-CM | POA: Diagnosis not present

## 2017-04-25 DIAGNOSIS — M545 Low back pain: Secondary | ICD-10-CM

## 2017-04-25 DIAGNOSIS — Z5111 Encounter for antineoplastic chemotherapy: Secondary | ICD-10-CM

## 2017-04-25 LAB — COMPREHENSIVE METABOLIC PANEL
ALT: 24 U/L (ref 0–55)
AST: 23 U/L (ref 5–34)
Albumin: 3.8 g/dL (ref 3.5–5.0)
Alkaline Phosphatase: 41 U/L (ref 40–150)
Anion Gap: 7 mEq/L (ref 3–11)
BUN: 11.7 mg/dL (ref 7.0–26.0)
CO2: 28 mEq/L (ref 22–29)
Calcium: 9.2 mg/dL (ref 8.4–10.4)
Chloride: 106 mEq/L (ref 98–109)
Creatinine: 0.7 mg/dL (ref 0.6–1.1)
EGFR: >90 mL/min/{1.73_m2} (ref >90)
Glucose: 90 mg/dl (ref 70–140)
Potassium: 4 mEq/L (ref 3.5–5.1)
Sodium: 141 mEq/L (ref 136–145)
Total Bilirubin: <0.22 mg/dL (ref 0.20–1.20)
Total Protein: 6.7 g/dL (ref 6.4–8.3)

## 2017-04-25 LAB — CBC WITH DIFFERENTIAL/PLATELET
BASO%: 0.7 % (ref 0.0–2.0)
Basophils Absolute: 0 10*3/uL (ref 0.0–0.1)
EOS%: 1.4 % (ref 0.0–7.0)
Eosinophils Absolute: 0 10*3/uL (ref 0.0–0.5)
HCT: 30.9 % — ABNORMAL LOW (ref 34.8–46.6)
HGB: 10.3 g/dL — ABNORMAL LOW (ref 11.6–15.9)
LYMPH%: 67.4 % — ABNORMAL HIGH (ref 14.0–49.7)
MCH: 29 pg (ref 25.1–34.0)
MCHC: 33.3 g/dL (ref 31.5–36.0)
MCV: 87 fL (ref 79.5–101.0)
MONO#: 0.1 10*3/uL (ref 0.1–0.9)
MONO%: 3.1 % (ref 0.0–14.0)
NEUT#: 0.6 10*3/uL — ABNORMAL LOW (ref 1.5–6.5)
NEUT%: 27.4 % — ABNORMAL LOW (ref 38.4–76.8)
Platelets: 180 10*3/uL (ref 145–400)
RBC: 3.55 10*6/uL — ABNORMAL LOW (ref 3.70–5.45)
RDW: 14.7 % — ABNORMAL HIGH (ref 11.2–14.5)
WBC: 2.2 10*3/uL — ABNORMAL LOW (ref 3.9–10.3)
lymph#: 1.5 10*3/uL (ref 0.9–3.3)

## 2017-04-25 MED ORDER — SODIUM CHLORIDE 0.9% FLUSH
10.0000 mL | INTRAVENOUS | Status: DC | PRN
  Administered 2017-04-25: 10 mL
  Filled 2017-04-25: qty 10

## 2017-04-25 MED ORDER — HEPARIN SOD (PORK) LOCK FLUSH 100 UNIT/ML IV SOLN
500.0000 [IU] | Freq: Once | INTRAVENOUS | Status: AC | PRN
  Administered 2017-04-25: 500 [IU]
  Filled 2017-04-25: qty 5

## 2017-04-25 MED ORDER — SODIUM CHLORIDE 0.9 % IV SOLN
Freq: Once | INTRAVENOUS | Status: AC
  Administered 2017-04-25: 10:00:00 via INTRAVENOUS

## 2017-04-25 MED ORDER — SODIUM CHLORIDE 0.9 % IV SOLN
400.0000 mg/m2 | Freq: Once | INTRAVENOUS | Status: AC
  Administered 2017-04-25: 684 mg via INTRAVENOUS
  Filled 2017-04-25: qty 17.99

## 2017-04-25 MED ORDER — PROCHLORPERAZINE MALEATE 10 MG PO TABS
10.0000 mg | ORAL_TABLET | Freq: Once | ORAL | Status: AC
  Administered 2017-04-25: 10 mg via ORAL

## 2017-04-25 MED ORDER — PROCHLORPERAZINE MALEATE 10 MG PO TABS
ORAL_TABLET | ORAL | Status: AC
  Filled 2017-04-25: qty 1

## 2017-04-25 NOTE — Patient Instructions (Signed)
Implanted Port Home Guide An implanted port is a type of central line that is placed under the skin. Central lines are used to provide IV access when treatment or nutrition needs to be given through a person's veins. Implanted ports are used for long-term IV access. An implanted port may be placed because:  You need IV medicine that would be irritating to the small veins in your hands or arms.  You need long-term IV medicines, such as antibiotics.  You need IV nutrition for a long period.  You need frequent blood draws for lab tests.  You need dialysis.  Implanted ports are usually placed in the chest area, but they can also be placed in the upper arm, the abdomen, or the leg. An implanted port has two main parts:  Reservoir. The reservoir is round and will appear as a small, raised area under your skin. The reservoir is the part where a needle is inserted to give medicines or draw blood.  Catheter. The catheter is a thin, flexible tube that extends from the reservoir. The catheter is placed into a large vein. Medicine that is inserted into the reservoir goes into the catheter and then into the vein.  How will I care for my incision site? Do not get the incision site wet. Bathe or shower as directed by your health care provider. How is my port accessed? Special steps must be taken to access the port:  Before the port is accessed, a numbing cream can be placed on the skin. This helps numb the skin over the port site.  Your health care provider uses a sterile technique to access the port. ? Your health care provider must put on a mask and sterile gloves. ? The skin over your port is cleaned carefully with an antiseptic and allowed to dry. ? The port is gently pinched between sterile gloves, and a needle is inserted into the port.  Only "non-coring" port needles should be used to access the port. Once the port is accessed, a blood return should be checked. This helps ensure that the port  is in the vein and is not clogged.  If your port needs to remain accessed for a constant infusion, a clear (transparent) bandage will be placed over the needle site. The bandage and needle will need to be changed every week, or as directed by your health care provider.  Keep the bandage covering the needle clean and dry. Do not get it wet. Follow your health care provider's instructions on how to take a shower or bath while the port is accessed.  If your port does not need to stay accessed, no bandage is needed over the port.  What is flushing? Flushing helps keep the port from getting clogged. Follow your health care provider's instructions on how and when to flush the port. Ports are usually flushed with saline solution or a medicine called heparin. The need for flushing will depend on how the port is used.  If the port is used for intermittent medicines or blood draws, the port will need to be flushed: ? After medicines have been given. ? After blood has been drawn. ? As part of routine maintenance.  If a constant infusion is running, the port may not need to be flushed.  How long will my port stay implanted? The port can stay in for as long as your health care provider thinks it is needed. When it is time for the port to come out, surgery will be   done to remove it. The procedure is similar to the one performed when the port was put in. When should I seek immediate medical care? When you have an implanted port, you should seek immediate medical care if:  You notice a bad smell coming from the incision site.  You have swelling, redness, or drainage at the incision site.  You have more swelling or pain at the port site or the surrounding area.  You have a fever that is not controlled with medicine.  This information is not intended to replace advice given to you by your health care provider. Make sure you discuss any questions you have with your health care provider. Document  Released: 08/16/2005 Document Revised: 01/22/2016 Document Reviewed: 04/23/2013 Elsevier Interactive Patient Education  2017 Else[employer] Inc.  

## 2017-04-25 NOTE — Telephone Encounter (Signed)
Scheduled appt per 8/27 los - Gave patient AVS and calender per los.

## 2017-04-25 NOTE — Patient Instructions (Signed)
[facility] Discharge Instructions for Patients Receiving Chemotherapy  Today you received the following chemotherapy agents Gemzar  To help prevent nausea and vomiting after your treatment, we encourage you to take your nausea medication as directed.    If you develop nausea and vomiting that is not controlled by your nausea medication, call the clinic.   BELOW ARE SYMPTOMS THAT SHOULD BE REPORTED IMMEDIATELY:  *FEVER GREATER THAN 100.5 F  *CHILLS WITH OR WITHOUT FEVER  NAUSEA AND VOMITING THAT IS NOT CONTROLLED WITH YOUR NAUSEA MEDICATION  *UNUSUAL SHORTNESS OF BREATH  *UNUSUAL BRUISING OR BLEEDING  TENDERNESS IN MOUTH AND THROAT WITH OR WITHOUT PRESENCE OF ULCERS  *URINARY PROBLEMS  *BOWEL PROBLEMS  UNUSUAL RASH Items with * indicate a potential emergency and should be followed up as soon as possible.  Feel free to call the clinic you have any questions or concerns. The clinic phone number is [phone].  Please show the CHEMO ALERT CARD at check-in to the Emergency Department and triage nurse.   

## 2017-04-25 NOTE — Progress Notes (Signed)
Pt saw Dr. Burr Medico today prior to chemo.  OK to treat per Dr. Burr Medico.

## 2017-04-25 NOTE — Telephone Encounter (Signed)
Per high priority schedule message sent yesterday 8/26 @ 5:32 pm, cancel 8/27 f/u. Patient already in center and arrived for f/u.

## 2017-04-26 ENCOUNTER — Ambulatory Visit (HOSPITAL_BASED_OUTPATIENT_CLINIC_OR_DEPARTMENT_OTHER): Payer: Federal, State, Local not specified - PPO

## 2017-04-26 ENCOUNTER — Telehealth: Payer: Self-pay | Admitting: *Deleted

## 2017-04-26 VITALS — BP 146/99 | HR 99 | Temp 99.2°F

## 2017-04-26 DIAGNOSIS — Z5189 Encounter for other specified aftercare: Secondary | ICD-10-CM | POA: Diagnosis not present

## 2017-04-26 DIAGNOSIS — C221 Intrahepatic bile duct carcinoma: Secondary | ICD-10-CM | POA: Diagnosis not present

## 2017-04-26 DIAGNOSIS — C7801 Secondary malignant neoplasm of right lung: Secondary | ICD-10-CM

## 2017-04-26 MED ORDER — PEGFILGRASTIM INJECTION 6 MG/0.6ML ~~LOC~~
6.0000 mg | PREFILLED_SYRINGE | Freq: Once | SUBCUTANEOUS | Status: AC
  Administered 2017-04-26: 6 mg via SUBCUTANEOUS
  Filled 2017-04-26: qty 0.6

## 2017-04-26 NOTE — Telephone Encounter (Signed)
"  Chemotherapy adjusted yesterday. Need to make Dr. Burr Medico aware I woke up with a headache.  Both eyes are red, puffy an d sore.  The white of my eyes are red.  Concerned about the swelling because when I look uo, down, left or right it's noticeable.  No itching or drainage to eyes.   I'm on my way now for Neulasta injection.  I'll keep my cell phone on for her to call me when I get there, [phone]."

## 2017-04-26 NOTE — Telephone Encounter (Signed)
I saw her today after her injection.   Truitt Merle MD

## 2017-05-03 ENCOUNTER — Ambulatory Visit: Payer: Federal, State, Local not specified - PPO

## 2017-05-03 ENCOUNTER — Ambulatory Visit: Payer: Federal, State, Local not specified - PPO | Admitting: Hematology

## 2017-05-03 ENCOUNTER — Other Ambulatory Visit: Payer: Federal, State, Local not specified - PPO

## 2017-05-04 ENCOUNTER — Ambulatory Visit: Payer: Federal, State, Local not specified - PPO

## 2017-05-06 ENCOUNTER — Telehealth: Payer: Self-pay | Admitting: *Deleted

## 2017-05-06 NOTE — Telephone Encounter (Signed)
Pt left message wanting to know when she could stop by to pick up completed paperwork left for Dr Burr Medico to fill out.   It was regarding disability retirement and a document stating that she was physically able to get messages here at the [facility].  Message was forwarded to Bryson Corona at [phone]  Pt's callback # [phone].

## 2017-05-11 ENCOUNTER — Encounter: Payer: Self-pay | Admitting: *Deleted

## 2017-05-12 ENCOUNTER — Other Ambulatory Visit: Payer: Self-pay | Admitting: *Deleted

## 2017-05-12 ENCOUNTER — Telehealth: Payer: Self-pay | Admitting: *Deleted

## 2017-05-12 DIAGNOSIS — C221 Intrahepatic bile duct carcinoma: Secondary | ICD-10-CM

## 2017-05-12 DIAGNOSIS — C7800 Secondary malignant neoplasm of unspecified lung: Principal | ICD-10-CM

## 2017-05-12 MED ORDER — HYDROCODONE-ACETAMINOPHEN 5-325 MG PO TABS: 1.0000 | ORAL_TABLET | Freq: Four times a day (QID) | ORAL | 0 refills | Status: DC | PRN

## 2017-05-12 NOTE — Telephone Encounter (Signed)
Pt called requesting refill of Norco.  Stated has only 2 pills left.   Stated has lots of pain from abdomen - liver area to along the back.  Pain was severe that kept pt from sleeping at night.  Stated she takes Norco to help with the pain so pt could rest at night. Pt's      Phone      [phone].

## 2017-05-13 ENCOUNTER — Telehealth: Payer: Self-pay

## 2017-05-13 ENCOUNTER — Telehealth: Payer: Self-pay | Admitting: *Deleted

## 2017-05-13 ENCOUNTER — Other Ambulatory Visit: Payer: Self-pay | Admitting: Hematology

## 2017-05-13 NOTE — Telephone Encounter (Signed)
Pt stated that she continues to have pain in the dx area.  She wants to discuss a different chemo tx.  She mentioned cocktail and pump.  She would appreciate getting a callback at [phone]

## 2017-05-13 NOTE — Telephone Encounter (Signed)
I called pt back, and we decided to change her chemo to [facility], starting on Monday 9/17, got her insurance approved today. I sent a scheduling message for her appointments after 9/17.   Truitt Merle MD

## 2017-05-13 NOTE — Progress Notes (Signed)
START OFF PATHWAY REGIMEN - [Other Dx]   [id number] (q14d):   A cycle is every 14 days:     Oxaliplatin      Leucovorin      5-Fluorouracil      5-Fluorouracil   **Always confirm dose/schedule in your pharmacy ordering system**  Patient Characteristics: Intent of Therapy: Non-Curative / Palliative Intent, Discussed with Patient 

## 2017-05-13 NOTE — Progress Notes (Signed)
[place]  [phone] [phone]) [phone]  Clinic Follow Up Note   Patient Care Team: Carollee Herter, Alferd Apa, DO as PCP - General Pyrtle, Lajuan Lines, MD as Consulting Physician (Gastroenterology) 05/16/2017  CHIEF COMPLAINTS:  Follow up metastatic intrahepatic cholangiocarcinoma  Oncology History   Metastatic cholangiocarcinoma to lung [facility])   Staging form: Perihilar Bile Ducts, AJCC 7th Edition   - Clinical stage from 07/07/2016: Stage IVB (T2b, N1, M1) - Signed by Truitt Merle, MD on 07/28/2016      Metastatic cholangiocarcinoma to lung ([place])   05/22/2014 Procedure    Routine screening colonoscopy showed a sessile polyp measuring 4 mm in the sigmoid colon, removed, otherwise negative.      06/29/2016 Imaging    CT chest, abdomen and pelvis showed 2 indeterminant nodule in left and the right lung, 4-58mm, indeterminate a hypovascular liver lesions largest in the caudate lobe measuring 2.6 cm, mild abdominal lymphadenopathy in the gastrohepatic ligament and portocaval space.      07/02/2016 Imaging    Abdominal MRI with and without contrast showed 2 lobular lesions in the caudate lobe, and is regular lymph nodes in the gastrohepatic ligament which is adjacent to the liver lesion, suspicious for malignancy. 2 small lesions in the right hepatic lobe are indeterminate, but concerning for metastasis.      07/07/2016 Procedure    EGD was negative, EUS biopsy of the liver and adjacent lymph node      07/07/2016 Initial Diagnosis    Metastatic cholangiocarcinoma to lung ([place])      07/07/2016 Initial Biopsy    Fine-needle aspiration of the liver lesion in caudate lobe and adjacent lymph nodes both showed metastatic adenocarcinoma.      08/11/2016 - 04/25/2017 Chemotherapy    Cisplatin 25 mg/m, gemcitabine 1000 mg/m, on day one and 8, every 21 days   Cisplatin and gemcitabine on Day 1, 8 every 21 days, with neulasta on day 9, Gemcitabine dosed reduced to 800  mg/m due to cytopenias, increased to 900mg /m2 on 12/06/2016. Changes to gemcitabine maintenance therapy on day 1 and 8 every 21 days, from 04/18/2017, gemcitabine was reduced dose to 400 mg/m on 04/25/17       10/18/2016 Imaging    CT CAP  IMPRESSION: 1. Again noted are multifocal liver metastasis. Overall there has been no significant interval change in overall volume of liver metastases. 2. Similar appearance of upper abdominal adenopathy 3. Slight decrease in size of small pulmonary metastases.      01/25/2017 Imaging    CT CAP W Contrast 01/25/17 IMPRESSION: 1. Response to therapy. 2. Similar to minimal decrease in pulmonary nodules/metastasis. 3. Improvement in hepatic disease burden. 4. Decrease in abdominal adenopathy. 5. Significantly age advanced atherosclerosis, including within the coronary arteries. 6. Uterine fibroids. 7. Suspect a complex cyst in the left ovary. Recommend attention on follow-up.       04/13/2017 Imaging    CT CAP W Contrast IMPRESSION: 1. The dominant caudate lobe mass is stable in size and appearance, as is the indistinct adenopathy in the porta hepatis adjacent to the caudate lobe, and in the retroperitoneum. Several subtle additional liver lesions are slightly less conspicuous on today' s exam. The pulmonary nodules are stable, and mild omental nodularity in the left lower quadrant is very slightly worsened compared to prior. Overall the burden of malignancy appears similar to prior, and some of the findings may represent effectively treated metastatic lesions. 2. Complex left adnexal mass, 6.7 by 4.1 cm,  enlarging. Pelvic sonography recommended for further characterization. 3. Other imaging findings of potential clinical significance: Aortic Atherosclerosis (ICD10-I70.0). Coronary atherosclerosis. Prominent stool throughout the colon favors constipation. Fibroid uterus.      05/16/2017 -  Chemotherapy    Second-line chemo FOLFOX every 2 weeks  with neulasta after pump DC       HISTORY OF PRESENTING ILLNESS: 07/28/16 Nancy [name] 51 y.o. female is here because of Her recently diagnosed metastatic glandular carcinoma. She is accompanied by her husband and mother to my clinic today.  She has been having RUQ abdominal pain since mid 04/2016, she was seen by PCP and was treated for gas with GI cocktail and pepcide, which she did not help much. Her pain got worse, and radiates to right shoulder, she denies significant nausea, or bloating, BM normal, no fever, cough or dyspnea. She recently noticed mild chest discomfort in the low sternum area. She initially had the lab, ultrasound done by her primary care physician, which was unrevealing. She was referred to GI Dr. Hilarie Fredrickson and underwent EGD and CT scan, which showed a lobulated mass in the caudate lobe of liver, with at adjacent adenopathy. He underwent EUS and fine-needle biopsy of the liver mass and lymph nodes, all reviewed adenocarcinoma.  She has lost about 7lbs in the past 3 months. She has mild fatigue, but able to function at home. She has stopped working due to her abdominal pain and fatigue. She takes Norco every 1-3 times a day, and her pain seems not well controlled.  CURRENT THERAPY:  Changed chemo to Second-line chemo FOLFOX every 2 weeks with neulasta starting 05/16/17  INTERIM HISTORY:  Nancy [name] returns for follow up and is accompanied by her husband.  She reports she had increasing pain in her abdomen and she takes 2 pills of hydrocondone 3 times a day. Last night it took longer to get the pain under control. She has been waking up through the night due to pain. She also notes pain in her back because of muscles.  She has not had a BM in 3 days and took a [place] S. She does pass gas nomrlaly. She notes she vomited twice in the last week and her appetite has changed. Compazine does not work well with her. She wanted to know if she needs to take her flu shot again this year and  if she can take B12 vitmains. She had dropped off forms to clinic and she has not gotten them back.    MEDICAL HISTORY:  Past Medical History:  Diagnosis Date  . Allergy    SEASONAL  . Anemia    HIGH SCHOOL  . Atopic eczema   . cholangio ca dx'd 07/12/16  . Fibrocystic breast disease   . GERD (gastroesophageal reflux disease)   . Hx of migraines    seasonal   . Pneumonia    6 years ago   . TIA (transient ischemic attack)     SURGICAL HISTORY: Past Surgical History:  Procedure Laterality Date  . EUS N/A 07/07/2016   Procedure: UPPER ENDOSCOPIC ULTRASOUND (EUS) RADIAL;  Surgeon: [name], MD;  Location: [facility] END[facility];  Service: Endoscopy;  Laterality: N/A;  . IR GENERIC HISTORICAL  08/03/2016   IR US GUIDE VASC ACCESS RIGHT 08/03/2016 Corrie Mckusick, DO [facility]-INTER[facility]  . IR GENERIC HISTORICAL  08/03/2016   IR FLUORO GUIDE PORT INSERTION RIGHT 08/03/2016 Corrie Mckusick, DO [facility]  . MYOMECTOMY  2002    SOCIAL HISTORY: Social History  Social History  . Marital status: Married    Spouse name: N/A  . Number of children: 0  . Years of education: 67   Occupational History  . [employer]  .  [employer]   Social History Main Topics  . Smoking status: Never Smoker  . Smokeless tobacco: Never Used  . Alcohol use Yes     Comment: maybe 3 times a year  . Drug use: No  . Sexual activity: Yes    Partners: Male   Other Topics Concern  . Not on file   Social History Narrative  . No narrative on file    FAMILY HISTORY: Family History  Problem Relation Age of Onset  . Gout Mother   . Diabetes Mother   . Hypertension Father   . Diabetes Father   . Breast cancer Paternal Aunt   . Cancer Maternal Grandfather        throat cancer   . Cancer Cousin 38       GI cancer   . Thyroid disease Neg Hx     ALLERGIES:  is allergic to penicillins and lipitor [atorvastatin].  MEDICATIONS:  Current Outpatient Prescriptions  Medication Sig Dispense Refill  .  ALPRAZolam (XANAX) 0.5 MG tablet Take 1 tablet (0.5 mg total) by mouth as needed for anxiety. 10 tablet 0  . aspirin 81 MG tablet Take 81 mg by mouth daily.    . cetirizine (ZYRTEC) 10 MG tablet Take 10 mg by mouth daily as needed. For allergies     . Cholecalciferol (VITAMIN D3) 5000 UNITS CAPS Take 1 capsule by mouth daily.    . COLOSTRUM PO Take 1.25 g by mouth 2 (two) times daily. 1.25 grams twice daily    . dicyclomine (BENTYL) 20 MG tablet 3 (three) times daily as needed.     . dronabinol (MARINOL) 5 MG capsule Take 1 capsule (5 mg total) by mouth 2 (two) times daily before a meal. 60 capsule 1  . HYDROcodone-acetaminophen (NORCO) 5-325 MG tablet Take 1-2 tablets by mouth every 6 (six) hours as needed for severe pain. 60 tablet 0  . lidocaine-prilocaine (EMLA) cream Apply 1 application topically as needed. Apply to portacath 1 1/2 hours - 2 hours prior to procedures as needed. 30 g 1  . meclizine (ANTIVERT) 50 MG tablet Take 1 tablet (50 mg total) by mouth 3 (three) times daily as needed. 30 tablet 0  . Menaquinone-7 (VITAMIN K2) 40 MCG TABS Take by mouth.    . mometasone (ELOCON) 0.1 % lotion Apply topically daily. 60 mL 0  . omeprazole (PRILOSEC) 40 MG capsule Take 1 capsule (40 mg total) by mouth 2 (two) times daily before a meal. 60 capsule 11  . ondansetron (ZOFRAN) 8 MG tablet Take 1 tablet (8 mg total) by mouth every 8 (eight) hours as needed for nausea or vomiting. 20 tablet 1  . Prenatal Vit-Fe Fumarate-FA (PRENATAL MULTIVITAMIN) TABS tablet Take 2 tablets by mouth daily at 12 noon.    . triamcinolone cream (KENALOG) 0.1 % Apply topically.    Marland Kitchen VITAMIN K PO Take 1 capsule by mouth daily.    Marland Kitchen dexamethasone (DECADRON) 4 MG tablet Take 1 tablet (4 mg total) by mouth daily. 20 tablet 1  . nystatin (MYCOSTATIN) 100000 UNIT/ML suspension Take 5 mLs (500,000 Units total) by mouth 4 (four) times daily. 473 mL 1  . oxyCODONE (OXYCONTIN) 10 mg 12 hr tablet Take 1 tablet (10 mg total) by  mouth every  12 (twelve) hours. 30 tablet 0   No current facility-administered medications for this visit.    Facility-Administered Medications Ordered in Other Visits  Medication Dose Route Frequency Provider Last Rate Last Dose  . alteplase (CATHFLO ACTIVASE) injection 2 mg  2 mg Intracatheter Once PRN Truitt Merle, MD      . fluorouracil (ADRUCIL) 3,350 mg in sodium chloride 0.9 % 83 mL chemo infusion  2,000 mg/m2 (Treatment Plan) Intravenous 1 day or 1 dose Truitt Merle, MD      . heparin lock flush 100 unit/mL  250 Units Intracatheter Once PRN Truitt Merle, MD      . leucovorin 672 mg in dextrose 5 % 250 mL infusion  400 mg/m2 (Treatment Plan) Intravenous Once Truitt Merle, MD 142 mL/hr at 05/16/17 1143 672 mg at 05/16/17 1143  . oxaliplatin (ELOXATIN) 100 mg in dextrose 5 % 500 mL chemo infusion  60 mg/m2 (Treatment Plan) Intravenous Once Truitt Merle, MD 260 mL/hr at 05/16/17 1142 100 mg at 05/16/17 1142  . sodium chloride flush (NS) 0.9 % injection 10 mL  10 mL Intracatheter PRN Truitt Merle, MD   10 mL at 09/09/16 1645  . sodium chloride flush (NS) 0.9 % injection 10 mL  10 mL Intravenous PRN Truitt Merle, MD   10 mL at 02/11/17 1515    REVIEW OF SYSTEMS:  Constitutional: Denies fevers, chills or abnormal night sweats (+) low appetite (+) weight loss Eyes: Denies blurriness of vision, double vision or watery eyes Ears, nose, mouth, throat, and face: Denies mucositis or sore throat Respiratory: Denies dyspnea or wheezes Cardiovascular: Denies palpitation, chest discomfort or lower extremity swelling Gastrointestinal:  Denies heartburn or change in bowel habits  (+) increased abdominal pain (+) constipation (+) nausea (+) vomiting Skin: Denies abnormal skin rashes  Lymphatics: Denies new lymphadenopathy or easy bruising Neurological:Denies numbness, tingling or new weaknesses (+) "chemo brain" (+)  distibular migraine Behavioral/Psych: Mood is stable, no new changes  (+) anxiety  Musculoskeletal: (+) back  pain  All other systems were reviewed with the patient and are negative.  PHYSICAL EXAMINATION:  ECOG PERFORMANCE STATUS: 1 - Symptomatic but completely ambulatory  Vitals:   05/16/17 0930  BP: 128/82  Pulse: 80  Resp: 17  Temp: 98.5 F (36.9 C)  TempSrc: Oral  SpO2: 100%  Weight: 133 lb (60.3 kg)  Height: 5\' 6"  (1.676 m)    GENERAL:alert, no distress and comfortable SKIN: skin color, texture, turgor are normal, no rashes or significant lesions, several hematomas on extremities  EYES: normal, conjunctiva are pink and non-injected, sclera clear OROPHARYNX:no exudate, no erythema and lips, buccal mucosa (+) oral thrush  NECK: supple, thyroid normal size, non-tender, without nodularity LYMPH:  no palpable lymphadenopathy in the cervical, axillary or inguinal LUNGS: clear to auscultation and percussion with normal breathing effort HEART: regular rate & rhythm and no murmurs and no lower extremity edema ABDOMEN:abdomen soft, non-tender and active bowel sounds, no hepatomegaly  Musculoskeletal:no cyanosis of digits and no clubbing  PSYCH: alert & oriented x 3 with fluent speech NEURO: no focal motor/sensory deficits  LABORATORY DATA:  I have reviewed the data as listed CBC Latest Ref Rng & Units 05/16/2017 04/25/2017 04/18/2017  WBC 3.9 - 10.3 10e3/uL 2.4(L) 2.2(L) 2.9(L)  Hemoglobin 11.6 - 15.9 g/dL 11.4(L) 10.3(L) 10.6(L)  Hematocrit 34.8 - 46.6 % 34.1(L) 30.9(L) 32.9(L)  Platelets 145 - 400 10e3/uL 322 180 208   CMP Latest Ref Rng & Units 05/16/2017 04/25/2017 04/18/2017  Glucose 70 - 140  mg/dl 111 90 92  BUN 7.0 - 26.0 mg/dL 9.6 11.7 13.5  Creatinine 0.6 - 1.1 mg/dL 0.8 0.7 0.7  Sodium 136 - 145 mEq/L 138 141 140  Potassium 3.5 - 5.1 mEq/L 3.6 4.0 3.9  Chloride 101 - 111 mmol/L - - -  CO2 22 - 29 mEq/L 27 28 28   Calcium 8.4 - 10.4 mg/dL 9.7 9.2 9.3  Total Protein 6.4 - 8.3 g/dL 7.3 6.7 6.5  Total Bilirubin 0.20 - 1.20 mg/dL 0.43 <0.22 0.29  Alkaline Phos 40 - 150 U/L 45 41  42  AST 5 - 34 U/L 30 23 21   ALT 0 - 55 U/L 29 24 21    ANC 1.0 today   CA19.9 (0-35U/ml) 07/28/2016: 36 08/18/2016: 51 09/13/2016: 56 10/19/2016: 43 11/09/2016: 44 12/06/2016: 41 01/05/2017: 35 02/02/2017: 37 03/07/17: 43 04/18/17: 66 05/16/17: PENDING    PATHOLOGY REPORT  Diagnosis 07/07/2016 FINE NEEDLE ASPIRATION, ENDOSCOPIC, LIVER (SPECIMEN 2 OF 2 COLLECTED 07/07/16): MALIGNANT CELLS CONSISTENT WITH ADENOCARCINOMA.  ADDITIONAL INFORMATION: Per request, immunohistochemistry was performed. The cells are positive for cytokeratin 7 and negative for cytokeratin 20 and CDX-2. This excludes a colorectal primary. Pancreaticobiliary and upper GI sources remain in the differential. Called to Dr. Burr Medico on 07/28/2016.  Diagnosis 07/07/2016 FINE NEEDLE ASPIRATION, ENDOSCOPIC, GASTRO-HEPATIC LYMPH NODE (SPECIMEN 1 OF 2 COLLECTED 07/07/16): MALIGNANT CELLS CONSISTENT WITH ADENOCARCINOMA, SEE COMMENT. Preliminary Diagnosis Intraoperative Diagnosis: Few atypical glandular clusters - recommend additional material. 4) Adequate ([facility])  RADIOGRAPHIC STUDIES: I have personally reviewed the radiological images as listed and agreed with the findings in the report.  CT CAP W Contrast, 04/13/17 IMPRESSION: 1. The dominant caudate lobe mass is stable in size and appearance, as is the indistinct adenopathy in the porta hepatis adjacent to the caudate lobe, and in the retroperitoneum. Several subtle additional liver lesions are slightly less conspicuous on today' s exam. The pulmonary nodules are stable, and mild omental nodularity in the left lower quadrant is very slightly worsened compared to prior. Overall the burden of malignancy appears similar to prior, and some of the findings may represent effectively treated metastatic lesions. 2. Complex left adnexal mass, 6.7 by 4.1 cm, enlarging. Pelvic sonography recommended for further characterization. 3. Other imaging findings of potential clinical  significance: Aortic Atherosclerosis (ICD10-I70.0). Coronary atherosclerosis. Prominent stool throughout the colon favors constipation. Fibroid uterus.  CT CAP W Contrast 01/25/17 IMPRESSION: 1. Response to therapy. 2. Similar to minimal decrease in pulmonary nodules/metastasis. 3. Improvement in hepatic disease burden. 4. Decrease in abdominal adenopathy. 5. Significantly age advanced atherosclerosis, including within the coronary arteries. 6. Uterine fibroids. 7. Suspect a complex cyst in the left ovary. Recommend attention on follow-up.  CT CAP W CONTRAST 10/18/16 IMPRESSION: 1. Again noted are multifocal liver metastasis. Overall there has been no significant interval change in overall volume of liver metastases. 2. Similar appearance of upper abdominal adenopathy 3. Slight decrease in size of small pulmonary metastases.  Upper EUS 07/07/2016 Dr. Ardis Hughs  - Endoscopically normal UGI tract (good views with standard gastroscope) - Vaguely bordered 4cm mass in the caudate lobe of liver that is confluent with what appear to be enlarged gastrohepatic lymphnodes (these may represent direct tumor extension however). Preliminary cytology from the lymphnodes shows malignancy, glandular. Preliminary cytology from the liver mass shows the same. Await final cytology results but this is most suspicious for peripheral intrahepatic cholangiocarcinoma with adjacent malignant adenopathy.  COLONOSCOPY 05/22/2014 ENDOSCOPIC IMPRESSION: 1. Sessile polyp measuring 4 mm in size was found in the sigmoid colon;  polypectomy was performed with a cold snare 2. The colon mucosa was otherwise normal  ASSESSMENT & PLAN:  51 y.o. African-American female, without significant past medical history, presented with abdominal pain  1. Metastatic intrahepatic cholangiocarcinoma, with probable node and lung metastasis, [id number], stage IV -I previously reviewed her CT, MRI, endoscopy findings and her biopsy results  with patient and her family members in details. -We have previously reviewed her case in our GI tumor board. Based on the scans and endoscopy findings, this is most consistent with cholangiocarcinoma. Her liver and inguinal biopsy showed adenocarcinoma, IHC was consistent with pancreatic-biliary primary -She has 2 additional small liver lesions and 2 4-18mm lung lesions, which are indeterminate but that is suspicious for metastatic disease. -She was seen by surgeon Dr. Barry Dienes who thinks she is a not a candidate for surgical resection due to her metastatic disease. -We previously discussed her cancer is incurable at this stage, the goal of therapy is palliation, to prolong life and improve her quality life -she is on first-line cisplatin and gemcitabine, on day 1, 8 every 21 days.  -She has been tolerating chemotherapy moderately well, but had significant neutropenia and thrombocytopenia, cycle 2 chemotherapy was dose reduced -I previously reviewed her restaging CT scan from 10/19/2016, which showed stable disease overall, no other new lesions. She is clinically doing well, her abdominal pain has much improved. -CT from 5/29 reviewed with pt and it shows excellent partical response to treatment, both liver lesions and adenopathy have decreased in size, small lung nodules are stable, no new lesions.  -I previously reviewed her CT scan from 04/13/2017, which showed a stable disease overall. Her tumor marker CA 19.9 has been slowly trending up, concerning for early resistance. -She has developed prolonged neutropenia, secondary to chemotherapy.  -Due to her worsening abdominal pain, I have strongly clinical suspicion she has to use of progression. She also has some allergy reaction to gemcitabine,so I recommend change her chemo to second line FOLFOX every 2 weeks. I discussed the side effects of this in great detail. She will receive neulasta injection after pump DC.  --Chemotherapy consent: Side effects  including but does not not limited to, fatigue, nausea, vomiting, diarrhea, hair loss, neuropathy, fluid retention, renal and kidney dysfunction, neutropenic fever, needed for blood transfusion, bleeding, coronary artery spasm, heart attack and heart failure, were discussed with patient in great detail. She agrees to proceed. -The goal of therapy is palliative, to prolong her life, and improve her quality of life. -She knows to come to clinic if she experiences a spike in fever.  -She will start with low dose FOLFOX today, due to her neutropenia. -f/u in 2 weeks before next cycle     2. Abdominal pain, back pain, worsening  -continue hydrocodone/acetaminophen every 4-6 hours as needed for pain control, she takes one tablet at night on average -Her abdominal pain has nearly resolved after she started chemotherapy,seems slightly worse again lately -She now has developed mild intermittent back pain, possible muscular pain. -Constipation and management previously reviewed with patient -Continue monitoring. -Her pain is increasing and no longer controled by hydrocodone, I will order long acting pain medication oxycontin 10 mg twice daily. She can still take hydrocodone as needed. I discussed this causing constipation.   3. Nausea and anorexia and weight loss  -We previously reviewed her nausea management, she will continue Compazine and Zofran -I previously encouraged her to take a nutritional supplement -We previously discussed appetite stimulant, she is on Marinol, dose was  increased to 5 mg twice daily last week, appetite improved, will continue. -she will f/u with dietician  -Previously encouraged her to have a high calorie, high protein diet. -Her appetite has decreased and loss weight. I suggest dexamethasone daily for 3-5 days after chemo to help her increase appetite. I discussed the effects of long term use.   4. Neutropenia and anemia -Secondary to chemotherapy, neutropenia much improved  with neulasta. -She is taking pre-natal vitamins that contain B-12 which is helpful -Continue monitoring  -She has persistent neutropenia from chemotherapy, we'll continue GCSF after chemo. Her HG has improved to 11.4   5. Goal of care discussion  -We previously discussed the incurable nature of her cancer, and the overall poor prognosis, especially if she does not have good response to chemotherapy or progress on chemo -The patient understands the goal of care is palliative. -I have previously recommended DNR/DNI, she will think about it.   6. Constipation -I discussed this can worsen with increase in pain medication   -I suggest she can take 4-6 tablets of seneca a day. If not enough she can take half bottle magnesium citrate until she has a BM. She can take seneca at least 2 tablets daily to make sure she has regular BM  7. Oral Thrush  -Will order nystatin mouthwash to use 3-4 times a day    Plan -Order Oxycontin 10 mg twice daily, she will continue use hydrocortisone as needed for breakthrough pain, and I'll change it to oxycodone on next refill -Order Dexamethasone to take daily 3-5 days after chemo for nausea and no appetite -Order nystatin mouthwash to use 3-4 times a day -Start chemo FOLFOX today, dose reduced due to neutropenia, she'll get Neulasta injection on day 3 -Lab, flush, f/u and chemo FOLFOX in 2 and 4 weeks    All questions were answered. The patient knows to call the clinic with any problems, questions or concerns.  I spent 25 minutes counseling the patient face to face. The total time spent in the appointment was 30 minutes and more than 50% was on counseling.   This document serves as a record of services personally performed by Truitt Merle, MD. It was created on her behalf by Joslyn Devon, a trained medical scribe. The creation of this record is based on the scribe's personal observations and the provider's statements to them. This document has been checked and  approved by the attending provider.    I have reviewed the above documentation for accuracy and completeness and I agree with the above.   Truitt Merle, MD 05/16/2017

## 2017-05-13 NOTE — Telephone Encounter (Signed)
Pt was wanting to know where the paperwork stands for  1) release to get a free massage at [facility] 2) her disability papers

## 2017-05-16 ENCOUNTER — Other Ambulatory Visit (HOSPITAL_BASED_OUTPATIENT_CLINIC_OR_DEPARTMENT_OTHER): Payer: Federal, State, Local not specified - PPO

## 2017-05-16 ENCOUNTER — Encounter: Payer: Self-pay | Admitting: Hematology

## 2017-05-16 ENCOUNTER — Ambulatory Visit (HOSPITAL_BASED_OUTPATIENT_CLINIC_OR_DEPARTMENT_OTHER): Payer: Federal, State, Local not specified - PPO | Admitting: Hematology

## 2017-05-16 ENCOUNTER — Ambulatory Visit (HOSPITAL_BASED_OUTPATIENT_CLINIC_OR_DEPARTMENT_OTHER): Payer: Federal, State, Local not specified - PPO

## 2017-05-16 ENCOUNTER — Telehealth: Payer: Self-pay | Admitting: Hematology

## 2017-05-16 VITALS — BP 128/82 | HR 80 | Temp 98.5°F | Resp 17 | Ht 66.0 in | Wt 133.0 lb

## 2017-05-16 DIAGNOSIS — C221 Intrahepatic bile duct carcinoma: Secondary | ICD-10-CM

## 2017-05-16 DIAGNOSIS — D701 Agranulocytosis secondary to cancer chemotherapy: Secondary | ICD-10-CM | POA: Diagnosis not present

## 2017-05-16 DIAGNOSIS — Z452 Encounter for adjustment and management of vascular access device: Secondary | ICD-10-CM

## 2017-05-16 DIAGNOSIS — Z5111 Encounter for antineoplastic chemotherapy: Secondary | ICD-10-CM | POA: Diagnosis not present

## 2017-05-16 DIAGNOSIS — C7801 Secondary malignant neoplasm of right lung: Secondary | ICD-10-CM

## 2017-05-16 DIAGNOSIS — D6481 Anemia due to antineoplastic chemotherapy: Secondary | ICD-10-CM | POA: Diagnosis not present

## 2017-05-16 DIAGNOSIS — T451X5A Adverse effect of antineoplastic and immunosuppressive drugs, initial encounter: Secondary | ICD-10-CM

## 2017-05-16 LAB — COMPREHENSIVE METABOLIC PANEL
ALT: 29 U/L (ref 0–55)
AST: 30 U/L (ref 5–34)
Albumin: 4.4 g/dL (ref 3.5–5.0)
Alkaline Phosphatase: 45 U/L (ref 40–150)
Anion Gap: 11 mEq/L (ref 3–11)
BUN: 9.6 mg/dL (ref 7.0–26.0)
CO2: 27 mEq/L (ref 22–29)
Calcium: 9.7 mg/dL (ref 8.4–10.4)
Chloride: 101 mEq/L (ref 98–109)
Creatinine: 0.8 mg/dL (ref 0.6–1.1)
EGFR: >90 mL/min/{1.73_m2} (ref >90)
Glucose: 111 mg/dl (ref 70–140)
Potassium: 3.6 mEq/L (ref 3.5–5.1)
Sodium: 138 mEq/L (ref 136–145)
Total Bilirubin: 0.43 mg/dL (ref 0.20–1.20)
Total Protein: 7.3 g/dL (ref 6.4–8.3)

## 2017-05-16 LAB — CBC WITH DIFFERENTIAL/PLATELET
BASO%: 0.7 % (ref 0.0–2.0)
Basophils Absolute: 0 10*3/uL (ref 0.0–0.1)
EOS%: 0.8 % (ref 0.0–7.0)
Eosinophils Absolute: 0 10*3/uL (ref 0.0–0.5)
HCT: 34.1 % — ABNORMAL LOW (ref 34.8–46.6)
HGB: 11.4 g/dL — ABNORMAL LOW (ref 11.6–15.9)
LYMPH%: 44.5 % (ref 14.0–49.7)
MCH: 29.1 pg (ref 25.1–34.0)
MCHC: 33.4 g/dL (ref 31.5–36.0)
MCV: 87.2 fL (ref 79.5–101.0)
MONO#: 0.3 10*3/uL (ref 0.1–0.9)
MONO%: 11.1 % (ref 0.0–14.0)
NEUT#: 1 10*3/uL — ABNORMAL LOW (ref 1.5–6.5)
NEUT%: 42.9 % (ref 38.4–76.8)
Platelets: 322 10*3/uL (ref 145–400)
RBC: 3.91 10*6/uL (ref 3.70–5.45)
RDW: 16.2 % — ABNORMAL HIGH (ref 11.2–14.5)
WBC: 2.4 10*3/uL — ABNORMAL LOW (ref 3.9–10.3)
lymph#: 1.1 10*3/uL (ref 0.9–3.3)

## 2017-05-16 MED ORDER — DEXTROSE 5 % IV SOLN
60.0000 mg/m2 | Freq: Once | INTRAVENOUS | Status: AC
  Administered 2017-05-16: 100 mg via INTRAVENOUS
  Filled 2017-05-16: qty 20

## 2017-05-16 MED ORDER — OXYCODONE HCL ER 10 MG PO T12A: 10.0000 mg | EXTENDED_RELEASE_TABLET | Freq: Two times a day (BID) | ORAL | 0 refills | Status: DC

## 2017-05-16 MED ORDER — DEXTROSE 5 % IV SOLN
Freq: Once | INTRAVENOUS | Status: AC
  Administered 2017-05-16: 11:00:00 via INTRAVENOUS

## 2017-05-16 MED ORDER — FLUOROURACIL CHEMO INJECTION 5 GM/100ML
2000.0000 mg/m2 | INTRAVENOUS | Status: DC
  Administered 2017-05-16: 3350 mg via INTRAVENOUS
  Filled 2017-05-16: qty 67

## 2017-05-16 MED ORDER — PALONOSETRON HCL INJECTION 0.25 MG/5ML
INTRAVENOUS | Status: AC
  Filled 2017-05-16: qty 5

## 2017-05-16 MED ORDER — PALONOSETRON HCL INJECTION 0.25 MG/5ML
0.2500 mg | Freq: Once | INTRAVENOUS | Status: AC
  Administered 2017-05-16: 0.25 mg via INTRAVENOUS

## 2017-05-16 MED ORDER — DEXAMETHASONE SODIUM PHOSPHATE 10 MG/ML IJ SOLN
10.0000 mg | Freq: Once | INTRAMUSCULAR | Status: AC
  Administered 2017-05-16: 10 mg via INTRAVENOUS

## 2017-05-16 MED ORDER — DEXAMETHASONE 4 MG PO TABS: 4.0000 mg | ORAL_TABLET | Freq: Every day | ORAL | 1 refills | Status: DC

## 2017-05-16 MED ORDER — DEXAMETHASONE SODIUM PHOSPHATE 10 MG/ML IJ SOLN
INTRAMUSCULAR | Status: AC
  Filled 2017-05-16: qty 1

## 2017-05-16 MED ORDER — SODIUM CHLORIDE 0.9% FLUSH
10.0000 mL | INTRAVENOUS | Status: DC | PRN
  Administered 2017-05-16: 10 mL
  Filled 2017-05-16: qty 10

## 2017-05-16 MED ORDER — NYSTATIN 100000 UNIT/ML MT SUSP
5.0000 mL | Freq: Four times a day (QID) | OROMUCOSAL | 1 refills | Status: DC
Start: 2017-05-16 — End: 2017-05-19

## 2017-05-16 MED ORDER — LEUCOVORIN CALCIUM INJECTION 350 MG
400.0000 mg/m2 | Freq: Once | INTRAVENOUS | Status: AC
  Administered 2017-05-16: 672 mg via INTRAVENOUS
  Filled 2017-05-16: qty 33.6

## 2017-05-16 NOTE — Patient Instructions (Signed)
Implanted Port Home Guide An implanted port is a type of central line that is placed under the skin. Central lines are used to provide IV access when treatment or nutrition needs to be given through a person's veins. Implanted ports are used for long-term IV access. An implanted port may be placed because:  You need IV medicine that would be irritating to the small veins in your hands or arms.  You need long-term IV medicines, such as antibiotics.  You need IV nutrition for a long period.  You need frequent blood draws for lab tests.  You need dialysis.  Implanted ports are usually placed in the chest area, but they can also be placed in the upper arm, the abdomen, or the leg. An implanted port has two main parts:  Reservoir. The reservoir is round and will appear as a small, raised area under your skin. The reservoir is the part where a needle is inserted to give medicines or draw blood.  Catheter. The catheter is a thin, flexible tube that extends from the reservoir. The catheter is placed into a large vein. Medicine that is inserted into the reservoir goes into the catheter and then into the vein.  How will I care for my incision site? Do not get the incision site wet. Bathe or shower as directed by your health care provider. How is my port accessed? Special steps must be taken to access the port:  Before the port is accessed, a numbing cream can be placed on the skin. This helps numb the skin over the port site.  Your health care provider uses a sterile technique to access the port. ? Your health care provider must put on a mask and sterile gloves. ? The skin over your port is cleaned carefully with an antiseptic and allowed to dry. ? The port is gently pinched between sterile gloves, and a needle is inserted into the port.  Only "non-coring" port needles should be used to access the port. Once the port is accessed, a blood return should be checked. This helps ensure that the port  is in the vein and is not clogged.  If your port needs to remain accessed for a constant infusion, a clear (transparent) bandage will be placed over the needle site. The bandage and needle will need to be changed every week, or as directed by your health care provider.  Keep the bandage covering the needle clean and dry. Do not get it wet. Follow your health care provider's instructions on how to take a shower or bath while the port is accessed.  If your port does not need to stay accessed, no bandage is needed over the port.  What is flushing? Flushing helps keep the port from getting clogged. Follow your health care provider's instructions on how and when to flush the port. Ports are usually flushed with saline solution or a medicine called heparin. The need for flushing will depend on how the port is used.  If the port is used for intermittent medicines or blood draws, the port will need to be flushed: ? After medicines have been given. ? After blood has been drawn. ? As part of routine maintenance.  If a constant infusion is running, the port may not need to be flushed.  How long will my port stay implanted? The port can stay in for as long as your health care provider thinks it is needed. When it is time for the port to come out, surgery will be   done to remove it. The procedure is similar to the one performed when the port was put in. When should I seek immediate medical care? When you have an implanted port, you should seek immediate medical care if:  You notice a bad smell coming from the incision site.  You have swelling, redness, or drainage at the incision site.  You have more swelling or pain at the port site or the surrounding area.  You have a fever that is not controlled with medicine.  This information is not intended to replace advice given to you by your health care provider. Make sure you discuss any questions you have with your health care provider. Document  Released: 08/16/2005 Document Revised: 01/22/2016 Document Reviewed: 04/23/2013 Elsevier Interactive Patient Education  2017 Else[employer] Inc.  

## 2017-05-16 NOTE — Patient Instructions (Signed)
[place] Discharge Instructions for Patients Receiving Chemotherapy  Today you received the following chemotherapy agents Oxaliplatin , Leucovorin and Adrucil  To help prevent nausea and vomiting after your treatment, we encourage you to take your nausea medication as directed but no zofran for 3 days  If you develop nausea and vomiting that is not controlled by your nausea medication, call the clinic.   BELOW ARE SYMPTOMS THAT SHOULD BE REPORTED IMMEDIATELY:  *FEVER GREATER THAN 100.5 F  *CHILLS WITH OR WITHOUT FEVER  NAUSEA AND VOMITING THAT IS NOT CONTROLLED WITH YOUR NAUSEA MEDICATION  *UNUSUAL SHORTNESS OF BREATH  *UNUSUAL BRUISING OR BLEEDING  TENDERNESS IN MOUTH AND THROAT WITH OR WITHOUT PRESENCE OF ULCERS  *URINARY PROBLEMS  *BOWEL PROBLEMS  UNUSUAL RASH Items with * indicate a potential emergency and should be followed up as soon as possible.  Feel free to call the clinic you have any questions or concerns. The clinic phone number is [phone].  Please show the [place] at check-in to the Emergency Department and triage nurse.

## 2017-05-16 NOTE — Telephone Encounter (Signed)
Scheduled appt per 9/17 los - Gave patient AVS and calender per los.

## 2017-05-17 ENCOUNTER — Ambulatory Visit: Payer: Federal, State, Local not specified - PPO

## 2017-05-17 ENCOUNTER — Encounter (HOSPITAL_BASED_OUTPATIENT_CLINIC_OR_DEPARTMENT_OTHER): Payer: Self-pay | Admitting: *Deleted

## 2017-05-17 ENCOUNTER — Emergency Department (HOSPITAL_BASED_OUTPATIENT_CLINIC_OR_DEPARTMENT_OTHER)
Admission: EM | Admit: 2017-05-17 | Discharge: 2017-05-17 | Disposition: A | Discharge status: Home / Self Care | Payer: Federal, State, Local not specified - PPO | Attending: Emergency Medicine | Admitting: Emergency Medicine

## 2017-05-17 DIAGNOSIS — Z79899 Other long term (current) drug therapy: Secondary | ICD-10-CM | POA: Insufficient documentation

## 2017-05-17 DIAGNOSIS — C7800 Secondary malignant neoplasm of unspecified lung: Secondary | ICD-10-CM | POA: Diagnosis not present

## 2017-05-17 DIAGNOSIS — Z7982 Long term (current) use of aspirin: Secondary | ICD-10-CM | POA: Insufficient documentation

## 2017-05-17 DIAGNOSIS — R112 Nausea with vomiting, unspecified: Secondary | ICD-10-CM | POA: Insufficient documentation

## 2017-05-17 DIAGNOSIS — R109 Unspecified abdominal pain: Secondary | ICD-10-CM | POA: Diagnosis present

## 2017-05-17 DIAGNOSIS — R1084 Generalized abdominal pain: Secondary | ICD-10-CM | POA: Diagnosis not present

## 2017-05-17 DIAGNOSIS — C221 Intrahepatic bile duct carcinoma: Secondary | ICD-10-CM | POA: Insufficient documentation

## 2017-05-17 LAB — CBC WITH DIFFERENTIAL/PLATELET
Basophils Absolute: 0 10*3/uL (ref 0.0–0.1)
Basophils Relative: 0 %
Eosinophils Absolute: 0 10*3/uL (ref 0.0–0.7)
Eosinophils Relative: 0 %
HCT: 34.3 % — ABNORMAL LOW (ref 36.0–46.0)
Hemoglobin: 11.8 g/dL — ABNORMAL LOW (ref 12.0–15.0)
Lymphocytes Relative: 29 %
Lymphs Abs: 1.1 10*3/uL (ref 0.7–4.0)
MCH: 29.6 pg (ref 26.0–34.0)
MCHC: 34.4 g/dL (ref 30.0–36.0)
MCV: 86 fL (ref 78.0–100.0)
Monocytes Absolute: 0.4 10*3/uL (ref 0.1–1.0)
Monocytes Relative: 9 %
Neutro Abs: 2.4 10*3/uL (ref 1.7–7.7)
Neutrophils Relative %: 62 %
Platelets: 370 10*3/uL (ref 150–400)
RBC: 3.99 MIL/uL (ref 3.87–5.11)
RDW: 14.2 % (ref 11.5–15.5)
WBC: 4 10*3/uL (ref 4.0–10.5)

## 2017-05-17 LAB — COMPREHENSIVE METABOLIC PANEL
ALT: 28 U/L (ref 14–54)
AST: 30 U/L (ref 15–41)
Albumin: 4.8 g/dL (ref 3.5–5.0)
Alkaline Phosphatase: 42 U/L (ref 38–126)
Anion gap: 12 (ref 5–15)
BUN: 12 mg/dL (ref 6–20)
CO2: 26 mmol/L (ref 22–32)
Calcium: 9.7 mg/dL (ref 8.9–10.3)
Chloride: 100 mmol/L — ABNORMAL LOW (ref 101–111)
Creatinine, Ser: 0.77 mg/dL (ref 0.44–1.00)
GFR calc Af Amer: >60 mL/min (ref >60)
GFR calc non Af Amer: >60 mL/min (ref >60)
Glucose, Bld: 101 mg/dL — ABNORMAL HIGH (ref 65–99)
Potassium: 3.4 mmol/L — ABNORMAL LOW (ref 3.5–5.1)
Sodium: 138 mmol/L (ref 135–145)
Total Bilirubin: 0.6 mg/dL (ref 0.3–1.2)
Total Protein: 7.6 g/dL (ref 6.5–8.1)

## 2017-05-17 LAB — LIPASE, BLOOD: Lipase: 24 U/L (ref 11–51)

## 2017-05-17 LAB — CANCER ANTIGEN 19-9: CA 19-9: 111 U/mL — ABNORMAL HIGH (ref 0–35)

## 2017-05-17 MED ORDER — ONDANSETRON HCL 4 MG/2ML IJ SOLN
4.0000 mg | Freq: Once | INTRAMUSCULAR | Status: AC
  Administered 2017-05-17: 4 mg via INTRAVENOUS
  Filled 2017-05-17: qty 2

## 2017-05-17 MED ORDER — HYDROMORPHONE HCL 1 MG/ML IJ SOLN
1.0000 mg | Freq: Once | INTRAMUSCULAR | Status: AC
  Administered 2017-05-17: 1 mg via INTRAVENOUS
  Filled 2017-05-17: qty 1

## 2017-05-17 NOTE — ED Triage Notes (Signed)
Pt with right upper abd pain and thoracic back pain intermittently x "a few weeks" progressively worsened tonight

## 2017-05-17 NOTE — ED Provider Notes (Signed)
[facility]PT [facility] Provider Note   CSN: [id number] Arrival date & time: 05/17/17  0226     History   Chief Complaint Chief Complaint  Patient presents with  . Back Pain  . Abdominal Pain    HPI Nancy [name] is a 51 y.o. female.  HPI  This is a 51 year old female with history of metastatic cholangiocarcinoma with ongoing chemotherapy who presents with abdominal pain. Patient reports 3 week history of generalized and right-sided abdominal pain. Pain is pretty persistent and progressively worsening over the last 3 weeks. It is sharp. She has associated back pain. No urinary symptoms. She has increased pain medications at home. She saw her oncologist yesterday and was transitioned to a chemotherapy pump. She was also changed pain medication. She states that she took her pain medication at home and vomited. Since that time she has not been able to get a handle on her pain. Current pain is 9 out of 10. She reports four-day history of clot is appreciated. She takes senna daily but no laxative.  Past Medical History:  Diagnosis Date  . Allergy    SEASONAL  . Anemia    H[employer]  . Atopic eczema   . cholangio ca dx'd 07/12/16  . Fibrocystic breast disease   . GERD (gastroesophageal reflux disease)   . Hx of migraines    seasonal   . Pneumonia    6 years ago   . TIA (transient ischemic attack)     Patient Active Problem List   Diagnosis Date Noted  . Anemia due to antineoplastic chemotherapy 02/13/2017  . Goals of care, counseling/discussion 10/04/2016  . Cerumen impaction 09/16/2016  . Abnormal finding on GI tract imaging   . Metastatic cholangiocarcinoma to lung ([place])   . Acute bronchitis 10/01/2015  . Vestibular migraine 06/20/2015  . TIA (transient ischemic attack) 06/12/2015  . Hyperlipidemia LDL goal <70 06/12/2015  . Diplopia 06/09/2015  . Vertigo 06/09/2015  . Right leg pain 04/12/2014  . Hyperthyroidism 03/13/2014  . MAMMOGRAM, ABNORMAL 10/31/2008    . ECZEMA, ATOPIC 10/15/2008  . FIBROCYSTIC BREAST DISEASE, HX OF 10/15/2008    Past Surgical History:  Procedure Laterality Date  . EUS N/A 07/07/2016   Procedure: UPPER ENDOSCOPIC ULTRASOUND (EUS) RADIAL;  Surgeon: [name], MD;  Location: [facility];  Service: Endoscopy;  Laterality: N/A;  . IR GENERIC HISTORICAL  08/03/2016   IR US GUIDE VASC ACCESS RIGHT 08/03/2016 Corrie Mckusick, DO [facility]  . IR GENERIC HISTORICAL  08/03/2016   IR FLUORO GUIDE PORT INSERTION RIGHT 08/03/2016 Corrie Mckusick, DO [facility]  . MYOMECTOMY  2002    OB History    No data available       Home Medications    Prior to Admission medications   Medication Sig Start Date End Date Taking? Authorizing Provider  ALPRAZolam Duanne Moron) 0.5 MG tablet Take 1 tablet (0.5 mg total) by mouth as needed for anxiety. 06/23/16   PyrtleLajuan Lines, MD  aspirin 81 MG tablet Take 81 mg by mouth daily.    [provider]  cetirizine (ZYRTEC) 10 MG tablet Take 10 mg by mouth daily as needed. For allergies     [provider]  Cholecalciferol (VITAMIN D3) 5000 UNITS CAPS Take 1 capsule by mouth daily.    [provider]  COLOSTRUM PO Take 1.25 g by mouth 2 (two) times daily. 1.25 grams twice daily    [provider]  dexamethasone (DECADRON) 4 MG tablet Take 1  tablet (4 mg total) by mouth daily. 05/16/17   Truitt Merle, MD  dicyclomine (BENTYL) 20 MG tablet 3 (three) times daily as needed.  07/22/16   [provider]  dronabinol (MARINOL) 5 MG capsule Take 1 capsule (5 mg total) by mouth 2 (two) times daily before a meal. 12/06/16   Truitt Merle, MD  HYDROcodone-acetaminophen (NORCO) 5-325 MG tablet Take 1-2 tablets by mouth every 6 (six) hours as needed for severe pain. 05/12/17   Truitt Merle, MD  lidocaine-prilocaine (EMLA) cream Apply 1 application topically as needed. Apply to portacath 1 1/2 hours - 2 hours prior to procedures as needed. 08/06/16   Truitt Merle, MD  meclizine (ANTIVERT)  50 MG tablet Take 1 tablet (50 mg total) by mouth 3 (three) times daily as needed. 08/29/15   [name], DO  Menaquinone-7 (VITAMIN K2) 40 MCG TABS Take by mouth.    [provider]  mometasone (ELOCON) 0.1 % lotion Apply topically daily. 08/21/15   [name], DO  nystatin (MYCOSTATIN) 100000 UNIT/ML suspension Take 5 mLs (500,000 Units total) by mouth 4 (four) times daily. 05/16/17   Truitt Merle, MD  omeprazole (PRILOSEC) 40 MG capsule Take 1 capsule (40 mg total) by mouth 2 (two) times daily before a meal. 06/23/16   Pyrtle, Lajuan Lines, MD  ondansetron (ZOFRAN) 8 MG tablet Take 1 tablet (8 mg total) by mouth every 8 (eight) hours as needed for nausea or vomiting. 08/16/16   Truitt Merle, MD  oxyCODONE (OXYCONTIN) 10 mg 12 hr tablet Take 1 tablet (10 mg total) by mouth every 12 (twelve) hours. 05/16/17   Truitt Merle, MD  Prenatal Vit-Fe Fumarate-FA (PRENATAL MULTIVITAMIN) TABS tablet Take 2 tablets by mouth daily at 12 noon.    [provider]  triamcinolone cream (KENALOG) 0.1 % Apply topically.    [provider]  VITAMIN K PO Take 1 capsule by mouth daily.    [provider]    Family History Family History  Problem Relation Age of Onset  . Gout Mother   . Diabetes Mother   . Hypertension Father   . Diabetes Father   . Breast cancer Paternal Aunt   . Cancer Maternal Grandfather        throat cancer   . Cancer Cousin 17       GI cancer   . Thyroid disease Neg Hx     Social History Social History  Substance Use Topics  . Smoking status: Never Smoker  . Smokeless tobacco: Never Used  . Alcohol use Yes     Comment: maybe 3 times a year     Allergies   Penicillins and Lipitor [atorvastatin]   Review of Systems Review of Systems  Constitutional: Negative for fever.  Respiratory: Negative for shortness of breath.   Cardiovascular: Negative for chest pain.  Gastrointestinal: Positive for abdominal pain, constipation, nausea and  vomiting. Negative for diarrhea.  Genitourinary: Negative for dysuria.  All other systems reviewed and are negative.    Physical Exam Updated Vital Signs BP (!) 159/117 (BP Location: Right Arm)   Pulse (!) 117   Temp 98.9 F (37.2 C) (Oral)   Resp 18   Ht 5\' 6"  (1.676 m)   Wt 60.3 kg (133 lb)   SpO2 96%   BMI 21.47 kg/m   Physical Exam  Constitutional: She is oriented to person, place, and time. No distress.  Uncomfortable appearing, no acute distress  HENT:  Head: Normocephalic and  atraumatic.  Mucous membranes dry  Cardiovascular: Regular rhythm and normal heart sounds.   Tachycardia  Pulmonary/Chest: Effort normal and breath sounds normal. No respiratory distress. She has no wheezes.  Abdominal: Soft. Bowel sounds are normal. She exhibits mass. There is tenderness. There is no rebound.  Diffuse tenderness palpation most prominent over the right upper quadrant, no rebound or guarding  Neurological: She is alert and oriented to person, place, and time.  Skin: Skin is warm and dry.  Psychiatric: She has a normal mood and affect.  Nursing note and vitals reviewed.    ED Treatments / Results  Labs (all labs ordered are listed, but only abnormal results are displayed) Labs Reviewed  CBC WITH DIFFERENTIAL/PLATELET - Abnormal; Notable for the following:       Result Value   Hemoglobin 11.8 (*)    HCT 34.3 (*)    All other components within normal limits  COMPREHENSIVE METABOLIC PANEL - Abnormal; Notable for the following:    Potassium 3.4 (*)    Chloride 100 (*)    Glucose, Bld 101 (*)    All other components within normal limits  LIPASE, BLOOD    EKG  EKG Interpretation None       Radiology No results found.  Procedures Procedures (including critical care time)  Medications Ordered in ED Medications  HYDROmorphone (DILAUDID) injection 1 mg (1 mg Intravenous Given 05/17/17 0339)  ondansetron (ZOFRAN) injection 4 mg (4 mg Intravenous Given 05/17/17 0339)       Initial Impression / Assessment and Plan / ED Course  I have reviewed the triage vital signs and the nursing notes.  Pertinent labs & imaging results that were available during my care of the patient were reviewed by me and considered in my medical decision making (see chart for details).     Patient presents with abdominal pain and vomiting. Symptoms refractory to medications at home. Worsening over the last 3 weeks. She is uncomfortable appearing but in no acute distress. Tender on exam without signs of peritonitis. Tachycardic and appears dry. Patient was given fluids, pain, and nausea medication. Lab work was obtained. Lab work is largely reassuring and on recheck, patient states that she feels much more comfortable. She has not had any imaging since 03/30/2017. I discussed with patient obtaining CT imaging to further evaluate ongoing pain. However, patient reports that she has close follow-up with her oncologist and feels comfortable foregoing imaging at this time as the pain has been ongoing and persistent for 3 weeks. Patient is able to tolerate fluids without difficulty. Given symptoms of constipation, she likely needs an increase in her bowel regimen. However, will defer to her oncologist as laxatives can increase in abdominal pain as well.  After history, exam, and medical workup I feel the patient has been appropriately medically screened and is safe for discharge home. Pertinent diagnoses were discussed with the patient. Patient was given return precautions.   Final Clinical Impressions(s) / ED Diagnoses   Final diagnoses:  Generalized abdominal pain  Non-intractable vomiting with nausea, unspecified vomiting type    New Prescriptions New Prescriptions   No medications on file     Merryl Hacker, MD 05/17/17 [phone]

## 2017-05-17 NOTE — Discharge Instructions (Signed)
You were seen today for abdominal pain and vomiting.  Your lab work-up is reassuring.  Your symptoms could be related to your known metastatic cancer, ongoing chemotherapy, constipation. Follow-up closely with your oncologist. Discussed with them increasing bowel regimen given constipation symptoms.

## 2017-05-18 ENCOUNTER — Ambulatory Visit (HOSPITAL_BASED_OUTPATIENT_CLINIC_OR_DEPARTMENT_OTHER): Payer: Federal, State, Local not specified - PPO | Admitting: Medical

## 2017-05-18 ENCOUNTER — Telehealth: Payer: Self-pay

## 2017-05-18 ENCOUNTER — Telehealth: Payer: Self-pay | Admitting: Medical

## 2017-05-18 ENCOUNTER — Ambulatory Visit (HOSPITAL_BASED_OUTPATIENT_CLINIC_OR_DEPARTMENT_OTHER): Payer: Federal, State, Local not specified - PPO

## 2017-05-18 ENCOUNTER — Other Ambulatory Visit: Payer: Self-pay | Admitting: *Deleted

## 2017-05-18 ENCOUNTER — Ambulatory Visit (HOSPITAL_COMMUNITY)
Admission: RE | Admit: 2017-05-18 | Discharge: 2017-05-18 | Disposition: A | Discharge status: Home / Self Care | Payer: Federal, State, Local not specified - PPO | Source: Ambulatory Visit | Attending: Hematology | Admitting: Hematology

## 2017-05-18 VITALS — BP 154/85 | HR 94 | Temp 98.6°F | Resp 18

## 2017-05-18 DIAGNOSIS — K5903 Drug induced constipation: Secondary | ICD-10-CM

## 2017-05-18 DIAGNOSIS — B379 Candidiasis, unspecified: Secondary | ICD-10-CM | POA: Diagnosis not present

## 2017-05-18 DIAGNOSIS — R112 Nausea with vomiting, unspecified: Secondary | ICD-10-CM

## 2017-05-18 DIAGNOSIS — Z5189 Encounter for other specified aftercare: Secondary | ICD-10-CM | POA: Diagnosis not present

## 2017-05-18 DIAGNOSIS — C7801 Secondary malignant neoplasm of right lung: Secondary | ICD-10-CM

## 2017-05-18 DIAGNOSIS — B370 Candidal stomatitis: Secondary | ICD-10-CM

## 2017-05-18 DIAGNOSIS — C221 Intrahepatic bile duct carcinoma: Secondary | ICD-10-CM | POA: Diagnosis not present

## 2017-05-18 DIAGNOSIS — C7800 Secondary malignant neoplasm of unspecified lung: Secondary | ICD-10-CM | POA: Insufficient documentation

## 2017-05-18 DIAGNOSIS — R197 Diarrhea, unspecified: Secondary | ICD-10-CM | POA: Diagnosis not present

## 2017-05-18 DIAGNOSIS — R109 Unspecified abdominal pain: Secondary | ICD-10-CM | POA: Diagnosis not present

## 2017-05-18 DIAGNOSIS — M5489 Other dorsalgia: Secondary | ICD-10-CM

## 2017-05-18 MED ORDER — LORAZEPAM 1 MG PO TABS
0.5000 mg | ORAL_TABLET | Freq: Once | ORAL | Status: AC
  Administered 2017-05-18: 0.5 mg via ORAL

## 2017-05-18 MED ORDER — ONDANSETRON HCL 4 MG/2ML IJ SOLN
8.0000 mg | Freq: Once | INTRAMUSCULAR | Status: AC
  Administered 2017-05-18: 8 mg via INTRAVENOUS

## 2017-05-18 MED ORDER — DEXAMETHASONE SODIUM PHOSPHATE 10 MG/ML IJ SOLN
10.0000 mg | Freq: Once | INTRAMUSCULAR | Status: AC
  Administered 2017-05-18: 10 mg via INTRAVENOUS

## 2017-05-18 MED ORDER — ONDANSETRON HCL 4 MG/2ML IJ SOLN
INTRAMUSCULAR | Status: AC
  Filled 2017-05-18: qty 4

## 2017-05-18 MED ORDER — MORPHINE SULFATE (PF) 4 MG/ML IV SOLN
INTRAVENOUS | Status: AC
  Filled 2017-05-18: qty 1

## 2017-05-18 MED ORDER — DEXAMETHASONE SODIUM PHOSPHATE 10 MG/ML IJ SOLN
INTRAMUSCULAR | Status: AC
  Filled 2017-05-18: qty 1

## 2017-05-18 MED ORDER — FLUCONAZOLE 100 MG PO TABS: 100.0000 mg | ORAL_TABLET | Freq: Every day | ORAL | 0 refills | Status: AC

## 2017-05-18 MED ORDER — PEGFILGRASTIM INJECTION 6 MG/0.6ML ~~LOC~~
6.0000 mg | PREFILLED_SYRINGE | Freq: Once | SUBCUTANEOUS | Status: AC
  Administered 2017-05-18: 6 mg via SUBCUTANEOUS
  Filled 2017-05-18: qty 0.6

## 2017-05-18 MED ORDER — SODIUM CHLORIDE 0.9% FLUSH
10.0000 mL | INTRAVENOUS | Status: DC | PRN
  Administered 2017-05-18: 10 mL
  Filled 2017-05-18: qty 10

## 2017-05-18 MED ORDER — LORAZEPAM 0.5 MG PO TABS: ORAL_TABLET | ORAL | 0 refills | Status: DC

## 2017-05-18 MED ORDER — HEPARIN SOD (PORK) LOCK FLUSH 100 UNIT/ML IV SOLN
500.0000 [IU] | Freq: Once | INTRAVENOUS | Status: AC | PRN
  Administered 2017-05-18: 500 [IU]
  Filled 2017-05-18: qty 5

## 2017-05-18 MED ORDER — SODIUM CHLORIDE 0.9 % IV SOLN
INTRAVENOUS | Status: DC
  Administered 2017-05-18: 12:00:00 via INTRAVENOUS

## 2017-05-18 MED ORDER — LORAZEPAM 1 MG PO TABS
ORAL_TABLET | ORAL | Status: AC
  Filled 2017-05-18: qty 1

## 2017-05-18 MED ORDER — MORPHINE SULFATE 4 MG/ML IJ SOLN
2.0000 mg | Freq: Once | INTRAMUSCULAR | Status: AC
  Administered 2017-05-18: 2 mg via INTRAVENOUS
  Filled 2017-05-18: qty 1

## 2017-05-18 MED ORDER — PROMETHAZINE HCL 25 MG PO TABS: 25.0000 mg | ORAL_TABLET | Freq: Four times a day (QID) | ORAL | 0 refills | Status: AC | PRN

## 2017-05-18 MED ORDER — SODIUM CHLORIDE 0.9 % IV SOLN: Freq: Once | INTRAVENOUS | Status: DC

## 2017-05-18 NOTE — Patient Instructions (Signed)
Dehydration, Adult Dehydration is when there is not enough fluid or water in your body. This happens when you lose more fluids than you take in. Dehydration can range from mild to very bad. It should be treated right away to keep it from getting very bad. Symptoms of mild dehydration may include:  Thirst.  Dry lips.  Slightly dry mouth.  Dry, warm skin.  Dizziness. Symptoms of moderate dehydration may include:  Very dry mouth.  Muscle cramps.  Dark pee (urine). Pee may be the color of tea.  Your body making less pee.  Your eyes making fewer tears.  Heartbeat that is uneven or faster than normal (palpitations).  Headache.  Light-headedness, especially when you stand up from sitting.  Fainting (syncope). Symptoms of very bad dehydration may include:  Changes in skin, such as: ? Cold and clammy skin. ? Blotchy (mottled) or pale skin. ? Skin that does not quickly return to normal after being lightly pinched and let go (poor skin turgor).  Changes in body fluids, such as: ? Feeling very thirsty. ? Your eyes making fewer tears. ? Not sweating when body temperature is high, such as in hot weather. ? Your body making very little pee.  Changes in vital signs, such as: ? Weak pulse. ? Pulse that is more than 100 beats a minute when you are sitting still. ? Fast breathing. ? Low blood pressure.  Other changes, such as: ? Sunken eyes. ? Cold hands and feet. ? Confusion. ? Lack of energy (lethargy). ? Trouble waking up from sleep. ? Short-term weight loss. ? Unconsciousness. Follow these instructions at home:  If told by your doctor, drink an ORS: ? Make an ORS by using instructions on the package. ? Start by drinking small amounts, about  cup (120 mL) every 5-10 minutes. ? Slowly drink more until you have had the amount that your doctor said to have.  Drink enough clear fluid to keep your pee clear or pale yellow. If you were told to drink an ORS, finish the ORS  first, then start slowly drinking clear fluids. Drink fluids such as: ? Water. Do not drink only water by itself. Doing that can make the salt (sodium) level in your body get too low (hyponatremia). ? Ice chips. ? Fruit juice that you have added water to (diluted). ? Low-calorie sports drinks.  Avoid: ? Alcohol. ? Drinks that have a lot of sugar. These include high-calorie sports drinks, fruit juice that does not have water added, and soda. ? Caffeine. ? Foods that are greasy or have a lot of fat or sugar.  Take over-the-counter and prescription medicines only as told by your doctor.  Do not take salt tablets. Doing that can make the salt level in your body get too high (hypernatremia).  Eat foods that have minerals (electrolytes). Examples include bananas, oranges, potatoes, tomatoes, and spinach.  Keep all follow-up visits as told by your doctor. This is important. Contact a doctor if:  You have belly (abdominal) pain that: ? Gets worse. ? Stays in one area (localizes).  You have a rash.  You have a stiff neck.  You get angry or annoyed more easily than normal (irritability).  You are more sleepy than normal.  You have a harder time waking up than normal.  You feel: ? Weak. ? Dizzy. ? Very thirsty.  You have peed (urinated) only a small amount of very dark pee during 6-8 hours. Get help right away if:  You have symptoms of   very bad dehydration.  You cannot drink fluids without throwing up (vomiting).  Your symptoms get worse with treatment.  You have a fever.  You have a very bad headache.  You are throwing up or having watery poop (diarrhea) and it: ? Gets worse. ? Does not go away.  You have blood or something green (bile) in your throw-up.  You have blood in your poop (stool). This may cause poop to look black and tarry.  You have not peed in 6-8 hours.  You pass out (faint).  Your heart rate when you are sitting still is more than 100 beats a  minute.  You have trouble breathing. This information is not intended to replace advice given to you by your health care provider. Make sure you discuss any questions you have with your health care provider. Document Released: 06/12/2009 Document Revised: 03/05/2016 Document Reviewed: 10/10/2015 Elsevier Interactive Patient Education  2018 [employer]  

## 2017-05-18 NOTE — Telephone Encounter (Signed)
Tim called that pt has been nauseated and vomited about 4-5 times since new chemo on Monday. She went to ER on 9/18 0234am. With increased abd pain and nausea, also constipation. They gave her fluids, dilaudid and zofran. They recommended milk of magnesium. Her pain is better controlled now. She is still nauseated. No vomiting this morning.  Diarrhea x2 this morning, small amounts, watery and solid.  She is due for pump dc today at 1230. S/w Dr Burr Medico and will set up pt to see [facility], after getting an 1 view abd xray. No labs d/t they were done yesterday.  S/w Tim and he will bring pt to [facility] radiology then [facility].

## 2017-05-18 NOTE — Telephone Encounter (Signed)
Left message for patient regarding upcoming 9/20 appointment.

## 2017-05-18 NOTE — Progress Notes (Signed)
[facility] Progress Note   Nancy [name] [id number] [birth year] 51 y.o.  Nancy [name] is managed by Dr. Truitt Merle  Actively treated with chemotherapy: yes  Current Therapy: FOLFOX   Last Treated: 09 / 17 / 2018 (cycle 1, day 1)  Assessment/Plan:   Intractable vomiting with nausea, unspecified vomiting type - Plan: 0.9 %  sodium chloride infusion, dexamethasone (DECADRON) injection 10 mg, ondansetron (ZOFRAN) injection 8 mg, LORazepam (ATIVAN) tablet 0.5 mg, LORazepam (ATIVAN) 0.5 MG tablet, promethazine (PHENERGAN) 25 MG tablet, DISCONTINUED: ondansetron (ZOFRAN) 8 mg, dexamethasone (DECADRON) 10 mg in sodium chloride 0.9 % 50 mL IVPB  Oral candidiasis - Plan: fluconazole (DIFLUCAN) 100 MG tablet, dexamethasone (DECADRON) injection 10 mg, ondansetron (ZOFRAN) injection 8 mg  Drug-induced constipation  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 She was given Zofran 8 mg IV, Decadron 10 mg IV and lorazepam 0.5 mg sublingual. A prescription for Ativan 0.5 mg every 8 hours when necessary and Phenergan 25 mg every 6 hours when necessary for nausea was given to the patient. The patient is a little less nauseated after receiving IV fluids and anti-emetics today. She will return tomorrow for additional IV fluids and will also receive additional dexamethasone IV and IV Zofran. We will consider giving her a prescription for dexamethasone 4 mg daily for 5 days should she continue to have some nausea tomorrow.  Oral candidiasis: Patient was given a prescription for Diflucan 100 mg by mouth once daily 7 days. She was told to hold the nystatin swish and swallow that she has been using since she has been having vomiting with this medication.  Drug-induced constipation: Abdominal x-ray returned today showing stool  in the colon without evidence of obstruction or ileus. There was no free air noted. The patient is been instructed to continue using milk of magnesia or magnesium citrate. She has a prescription for MiraLAX which she has not yet used. She is also been told that she could use prune juice in addition to the above agents.  Cholangiocarcinoma, metastatic to the right lung: The patient is status post cycle 1 of FOLFOX. Her pump was discontinued today. She will receive Neulasta today as ordered. Her next appointment with Dr. Burr Medico is scheduled for 05/30/2017.  Back pain: The patient was given morphine 2 mg IV today. She has been instructed to continue using oxycodone 10 mg every 12 hours and hydrocodone-APAP 5/325, 1-2 tablets every 6 hours as needed for pain.  Please see After Visit Summary for patient specific instructions.  Future Appointments Date Time Provider [place]  05/24/2017 3:00 PM Carollee Herter, Alferd Apa, DO [facility] None  05/30/2017 8:00 AM [facility] LAB 2 [facility] None  05/30/2017 8:15 AM [facility] FLUSH NURSE 2 [facility] None  05/30/2017 9:00 AM Truitt Merle, MD [facility] None  05/30/2017 10:00 AM [facility] J32 DNS [facility] None  06/01/2017 11:00 AM [facility] J32 DNS [facility] None  06/13/2017 8:45 AM [facility] LAB 2 [facility] None  06/13/2017 9:00 AM [facility] FLUSH NURSE [facility] None  06/13/2017 9:30 AM Alla Feeling, NP [facility] None  06/13/2017 10:30 AM [facility] I25 DNS [facility] None  06/15/2017 11:00 AM [facility] FLUSH NURSE 2 [facility] None    No orders of the defined types were placed in this encounter.      Subjective:   Patient ID:  Nancy [name] is a  51 y.o. (DOB 1966/04/03) female.  Chief Complaint: No chief complaint on file.   HPI Jude Linck is a 51 year old female with a history of a metastatic intrahepatic cholangiocarcinoma, with probable node and lung metastasis, stage IVB (T2b,N1,M1)  who presents to  the office today with nausea, vomiting, abdominal pain, constipation initially, now with diarrhea after taking milk of magnesia and after being dosed with cycle 1 day 1 of FOLFOX given on 05/16/2017. The patient had nausea and vomited around 4-5 times since chemotherapy on Monday. She went to the emergency room on 05/17/2017 secondary to increased abdominal pain, right upper and mid back pain, nausea and constipation. She was given IV fluids, Dilaudid, and Zofran. The ER recommended milk of magnesia for her constipation.  She reports that Compazine does not seem to help with her nausea. She has used Zofran but continues to have nausea although, she has had no more vomiting this morning. She has taken 2 doses of milk of magnesia. She's had 2 episodes of diarrhea this morning and passed a small amount of stool that was watery and with solid components. Her abdominal pain has lessened since beginning FOLFOX on Monday. Her right back pain continues without much improvement. She has been using over-the-counter Aspercreme which has helped some. She has taken less Norco since her chemotherapy.  She reports that the OxyContin ER 10 mg which is to be taken every 12 hours has caused increased nausea. She has thrush. She has been using nystatin swish and swallow which she not been able to keep down due to her nausea. The taste in her mouth caused by the thrush has also added to her nausea. She denies fevers, chills, or sweats.  Medications: I have reviewed the patient's current medications.  Allergies:  Allergies  Allergen Reactions  . Penicillins Anaphylaxis    Has patient had a PCN reaction causing immediate rash, facial/tongue/throat swelling, SOB or lightheadedness with hypotensionYes Swelling in throat  Has patient had a PCN reaction causing severe rash involving mucus membranes or skin necrosis:/No Has patient had a PCN reaction that required hospitalization/No Has patient had a PCN reaction occurring within  the last 10 years:NO If all of the above answers are "NO", then may proceed with Cephalosporin use.   . Lipitor [Atorvastatin] Palpitations    Tingling, flushing    Past Medical History:  Diagnosis Date  . Allergy    SEASONAL  . Anemia    HIGH SCHOOL  . Atopic eczema   . cholangio ca dx'd 07/12/16  . Fibrocystic breast disease   . GERD (gastroesophageal reflux disease)   . Hx of migraines    seasonal   . Pneumonia    6 years ago   . TIA (transient ischemic attack)     Past Surgical History:  Procedure Laterality Date  . EUS N/A 07/07/2016   Procedure: UPPER ENDOSCOPIC ULTRASOUND (EUS) RADIAL;  Surgeon: [name], MD;  Location: [facility];  Service: Endoscopy;  Laterality: N/A;  . IR GENERIC HISTORICAL  08/03/2016   IR US GUIDE VASC ACCESS RIGHT 08/03/2016 Corrie Mckusick, DO [facility]  . IR GENERIC HISTORICAL  08/03/2016   IR FLUORO GUIDE PORT INSERTION RIGHT 08/03/2016 Corrie Mckusick, DO [facility]  . MYOMECTOMY  2002    @FAMHISTORY @  Social History   Social History  . Marital status: Married    Spouse name: N/A  . Number of children: 0  . Years of education: 27   Occupational History  . 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  [employer]   Social History Main Topics  . Smoking status: Never Smoker  . Smokeless tobacco: Never Used  . Alcohol use Yes     Comment: maybe 3 times a year  . Drug use: No  . Sexual activity: Yes    Partners: Male   Other Topics Concern  . Not on file   Social History Narrative  . No narrative on file    Past Medical History, Surgical history, Social history, and Family history were reviewed and updated as appropriate.   Please see review of systems for further details on the patient's review from today.   Review of Systems:  Review of Systems  Constitutional: Positive for appetite change and fatigue. Negative for chills, diaphoresis and fever.  HENT:       Oral plaquing with plaquing over the tongue and posterior pharynx    Respiratory: Negative for cough, choking, chest tightness and shortness of breath.   Cardiovascular: Negative for chest pain and leg swelling.  Gastrointestinal: Positive for abdominal pain, constipation, diarrhea, nausea and vomiting. Negative for abdominal distention.  Musculoskeletal: Positive for back pain.  Neurological: Positive for weakness.    Objective:   Physical Exam:  BP (!) 154/85 (BP Location: Right Arm, Patient Position: Sitting)   Pulse 94   Temp 98.6 F (37 C) (Oral)   Resp 18   SpO2 100%  ECOG: 2  Physical Exam  Constitutional: No distress.  HENT:  Head: Normocephalic and atraumatic.  Whitish plaquing over the tongue and posterior pharynx.  Neck: Normal range of motion. Neck supple.  Cardiovascular: Normal rate, regular rhythm and normal heart sounds.  Exam reveals no gallop and no friction rub.   No murmur heard. Pulmonary/Chest: Effort normal and breath sounds normal. No respiratory distress. She has no wheezes. She has no rales.  Abdominal: Soft. Bowel sounds are normal. She exhibits no distension. There is tenderness. There is no rebound and no guarding.  Musculoskeletal: She exhibits no edema or tenderness.  Lymphadenopathy:    She has no cervical adenopathy.  Neurological: She is alert.  Skin: Skin is warm and dry. No rash noted. She is not diaphoretic. No erythema.  An access port was noted on the right chest wall. No erythema was appreciated.    Lab Review:     Component Value Date/Time   NA 138 05/17/2017 0335   NA 138 05/16/2017 0817   K 3.4 (L) 05/17/2017 0335   K 3.6 05/16/2017 0817   CL 100 (L) 05/17/2017 0335   CO2 26 05/17/2017 0335   CO2 27 05/16/2017 0817   GLUCOSE 101 (H) 05/17/2017 0335   GLUCOSE 111 05/16/2017 0817   BUN 12 05/17/2017 0335   BUN 9.6 05/16/2017 0817   CREATININE 0.77 05/17/2017 0335   CREATININE 0.8 05/16/2017 0817   CALCIUM 9.7 05/17/2017 0335   CALCIUM 9.7 05/16/2017 0817   PROT 7.6 05/17/2017 0335   PROT  7.3 05/16/2017 0817   ALBUMIN 4.8 05/17/2017 0335   ALBUMIN 4.4 05/16/2017 0817   AST 30 05/17/2017 0335   AST 30 05/16/2017 0817   ALT 28 05/17/2017 0335   ALT 29 05/16/2017 0817   ALKPHOS 42 05/17/2017 0335   ALKPHOS 45 05/16/2017 0817   BILITOT 0.6 05/17/2017 0335   BILITOT 0.43 05/16/2017 0817   GFRNONAA >60 05/17/2017 0335   GFRAA >60 05/17/2017 0335       Component Value Date/Time   WBC 4.0 05/17/2017 0335  RBC 3.99 05/17/2017 0335   HGB 11.8 (L) 05/17/2017 0335   HGB 11.4 (L) 05/16/2017 0817   HCT 34.3 (L) 05/17/2017 0335   HCT 34.1 (L) 05/16/2017 0817   PLT 370 05/17/2017 0335   PLT 322 05/16/2017 0817   MCV 86.0 05/17/2017 0335   MCV 87.2 05/16/2017 0817   MCH 29.6 05/17/2017 0335   MCHC 34.4 05/17/2017 0335   RDW 14.2 05/17/2017 0335   RDW 16.2 (H) 05/16/2017 0817   LYMPHSABS 1.1 05/17/2017 0335   LYMPHSABS 1.1 05/16/2017 0817   MONOABS 0.4 05/17/2017 0335   MONOABS 0.3 05/16/2017 0817   EOSABS 0.0 05/17/2017 0335   EOSABS 0.0 05/16/2017 0817   BASOSABS 0.0 05/17/2017 0335   BASOSABS 0.0 05/16/2017 0817   -------------------------------  Imaging from last 24 hours (if applicable):  Radiology interpretation: Dg Abd 1 View  Result Date: 05/18/2017 CLINICAL DATA:  Three-week history of left upper quadrant abdominal pain acutely worsened 2 days ago. Current history of metastatic cholangiocarcinoma. EXAM: ABDOMEN - 1 VIEW COMPARISON:  CT abdomen and pelvis 04/13/2017, 01/25/2017 and earlier. FINDINGS: Bowel gas pattern unremarkable without evidence of obstruction or significant ileus. Expected stool burden in the colon. No suggestion of free air on the supine image. No visible opaque urinary tract calculi. Regional skeleton intact. IMPRESSION: No acute abdominal abnormality. Electronically Signed   By: Evangeline Dakin M.D.   On: 05/18/2017 10:56        This patient was seen with Dr. Burr Medico with my treatment plan reviewed with her. She expressed agreement with  my medical management of this patient.  I have seen the patient, examined her. I agree with the assessment and and plan and have edited the notes.   Lezie developed significant nausea and vomiting after first cycle FOLFOX, KUB negative for bowel obstruction. She feels better after IV fluids and antiemetics, will return tomorrow for more IV fluids and follow-up.  Truitt Merle  05/18/2017

## 2017-05-18 NOTE — Progress Notes (Unsigned)
Pump d/c'd before adding IVF.  5FU infused & pump was stopped by pt.

## 2017-05-19 ENCOUNTER — Ambulatory Visit (HOSPITAL_BASED_OUTPATIENT_CLINIC_OR_DEPARTMENT_OTHER): Payer: Federal, State, Local not specified - PPO

## 2017-05-19 ENCOUNTER — Other Ambulatory Visit: Payer: Self-pay | Admitting: Medical

## 2017-05-19 ENCOUNTER — Ambulatory Visit (HOSPITAL_BASED_OUTPATIENT_CLINIC_OR_DEPARTMENT_OTHER): Payer: Federal, State, Local not specified - PPO | Admitting: Medical

## 2017-05-19 ENCOUNTER — Encounter: Payer: Self-pay | Admitting: Medical

## 2017-05-19 VITALS — BP 153/94 | HR 109 | Temp 100.0°F | Resp 18

## 2017-05-19 DIAGNOSIS — B379 Candidiasis, unspecified: Secondary | ICD-10-CM

## 2017-05-19 DIAGNOSIS — R110 Nausea: Secondary | ICD-10-CM

## 2017-05-19 DIAGNOSIS — R1011 Right upper quadrant pain: Secondary | ICD-10-CM

## 2017-05-19 DIAGNOSIS — C221 Intrahepatic bile duct carcinoma: Secondary | ICD-10-CM

## 2017-05-19 DIAGNOSIS — R197 Diarrhea, unspecified: Secondary | ICD-10-CM

## 2017-05-19 DIAGNOSIS — M545 Low back pain: Secondary | ICD-10-CM | POA: Diagnosis not present

## 2017-05-19 DIAGNOSIS — R000 Tachycardia, unspecified: Secondary | ICD-10-CM | POA: Diagnosis not present

## 2017-05-19 DIAGNOSIS — E860 Dehydration: Secondary | ICD-10-CM

## 2017-05-19 DIAGNOSIS — R112 Nausea with vomiting, unspecified: Secondary | ICD-10-CM

## 2017-05-19 DIAGNOSIS — M549 Dorsalgia, unspecified: Secondary | ICD-10-CM

## 2017-05-19 DIAGNOSIS — C7801 Secondary malignant neoplasm of right lung: Secondary | ICD-10-CM

## 2017-05-19 DIAGNOSIS — G8929 Other chronic pain: Secondary | ICD-10-CM

## 2017-05-19 LAB — MAGNESIUM: Magnesium: 2 mg/dl (ref 1.5–2.5)

## 2017-05-19 LAB — COMPREHENSIVE METABOLIC PANEL
ALT: 28 U/L (ref 0–55)
AST: 31 U/L (ref 5–34)
Albumin: 4.5 g/dL (ref 3.5–5.0)
Alkaline Phosphatase: 55 U/L (ref 40–150)
Anion Gap: 14 mEq/L — ABNORMAL HIGH (ref 3–11)
BUN: 16.6 mg/dL (ref 7.0–26.0)
CO2: 21 mEq/L — ABNORMAL LOW (ref 22–29)
Calcium: 9.4 mg/dL (ref 8.4–10.4)
Chloride: 105 mEq/L (ref 98–109)
Creatinine: 0.8 mg/dL (ref 0.6–1.1)
EGFR: >90 mL/min/{1.73_m2} (ref >90)
Glucose: 70 mg/dl (ref 70–140)
Potassium: 3.8 mEq/L (ref 3.5–5.1)
Sodium: 139 mEq/L (ref 136–145)
Total Bilirubin: 0.66 mg/dL (ref 0.20–1.20)
Total Protein: 7.4 g/dL (ref 6.4–8.3)

## 2017-05-19 LAB — CBC & DIFF AND RETIC
BASO%: 0.1 % (ref 0.0–2.0)
Basophils Absolute: 0.1 10*3/uL (ref 0.0–0.1)
EOS%: 0 % (ref 0.0–7.0)
Eosinophils Absolute: 0 10*3/uL (ref 0.0–0.5)
HCT: 35.5 % (ref 34.8–46.6)
HGB: 11.4 g/dL — ABNORMAL LOW (ref 11.6–15.9)
Immature Retic Fract: 4.1 % (ref 1.60–10.00)
LYMPH%: 3.4 % — ABNORMAL LOW (ref 14.0–49.7)
MCH: 28.5 pg (ref 25.1–34.0)
MCHC: 32.3 g/dL (ref 31.5–36.0)
MCV: 88.2 fL (ref 79.5–101.0)
MONO#: 0.7 10*3/uL (ref 0.1–0.9)
MONO%: 1 % (ref 0.0–14.0)
NEUT#: 66.3 10*3/uL — ABNORMAL HIGH (ref 1.5–6.5)
NEUT%: 95.5 % — ABNORMAL HIGH (ref 38.4–76.8)
Platelets: 273 10*3/uL (ref 145–400)
RBC: 4.02 10*6/uL (ref 3.70–5.45)
RDW: 16.5 % — ABNORMAL HIGH (ref 11.2–14.5)
Retic %: 1.46 % (ref 0.70–2.10)
Retic Ct Abs: 58.69 10*3/uL (ref 33.70–90.70)
WBC: 69.4 10*3/uL (ref 3.9–10.3)
lymph#: 2.3 10*3/uL (ref 0.9–3.3)
nRBC: 0 % (ref 0–0)

## 2017-05-19 LAB — URINALYSIS, MICROSCOPIC - CHCC
Bilirubin (Urine): NEGATIVE
Glucose: NEGATIVE mg/dL
Ketones: 160 mg/dL
Nitrite: NEGATIVE
Protein: 30 mg/dL
Specific Gravity, Urine: 1.03 (ref 1.003–1.035)
Urobilinogen, UR: 0.2 mg/dL (ref 0.2–1)
pH: 6 (ref 4.6–8.0)

## 2017-05-19 MED ORDER — MORPHINE SULFATE (PF) 4 MG/ML IV SOLN
INTRAVENOUS | Status: AC
  Filled 2017-05-19: qty 1

## 2017-05-19 MED ORDER — DEXAMETHASONE 4 MG PO TABS: 4.0000 mg | ORAL_TABLET | Freq: Every day | ORAL | 0 refills | Status: AC

## 2017-05-19 MED ORDER — SODIUM CHLORIDE 0.9 % IV SOLN
Freq: Once | INTRAVENOUS | Status: AC
  Administered 2017-05-19: 14:00:00 via INTRAVENOUS

## 2017-05-19 MED ORDER — LORAZEPAM 1 MG PO TABS
0.5000 mg | ORAL_TABLET | Freq: Once | ORAL | Status: AC
  Administered 2017-05-19: 0.5 mg via ORAL

## 2017-05-19 MED ORDER — DEXAMETHASONE SODIUM PHOSPHATE 10 MG/ML IJ SOLN
10.0000 mg | Freq: Once | INTRAMUSCULAR | Status: AC
Start: 2017-05-19 — End: 2017-05-19
  Administered 2017-05-19: 10 mg via INTRAVENOUS

## 2017-05-19 MED ORDER — SODIUM CHLORIDE 0.9 % IV SOLN
INTRAVENOUS | Status: AC
  Administered 2017-05-19: 17:00:00 via INTRAVENOUS

## 2017-05-19 MED ORDER — MORPHINE SULFATE 4 MG/ML IJ SOLN
2.0000 mg | Freq: Once | INTRAMUSCULAR | Status: AC
  Administered 2017-05-19: 2 mg via INTRAVENOUS
  Filled 2017-05-19: qty 1

## 2017-05-19 MED ORDER — ONDANSETRON HCL 4 MG/2ML IJ SOLN
8.0000 mg | Freq: Once | INTRAMUSCULAR | Status: AC
  Administered 2017-05-19: 8 mg via INTRAVENOUS

## 2017-05-19 MED ORDER — HEPARIN SOD (PORK) LOCK FLUSH 100 UNIT/ML IV SOLN
500.0000 [IU] | Freq: Once | INTRAVENOUS | Status: AC | PRN
  Administered 2017-05-19: 500 [IU]
  Filled 2017-05-19: qty 5

## 2017-05-19 MED ORDER — LORAZEPAM 1 MG PO TABS
ORAL_TABLET | ORAL | Status: AC
  Filled 2017-05-19: qty 1

## 2017-05-19 MED ORDER — DEXAMETHASONE SODIUM PHOSPHATE 10 MG/ML IJ SOLN
INTRAMUSCULAR | Status: AC
  Filled 2017-05-19: qty 1

## 2017-05-19 MED ORDER — SODIUM CHLORIDE 0.9 % IV SOLN: Freq: Once | INTRAVENOUS | Status: DC

## 2017-05-19 MED ORDER — SODIUM CHLORIDE 0.9% FLUSH
10.0000 mL | INTRAVENOUS | Status: DC | PRN
  Administered 2017-05-19: 10 mL
  Filled 2017-05-19: qty 10

## 2017-05-19 MED ORDER — ONDANSETRON HCL 4 MG/2ML IJ SOLN
INTRAMUSCULAR | Status: AC
  Filled 2017-05-19: qty 4

## 2017-05-19 NOTE — Progress Notes (Signed)
[facility] Progress Note   Nancy [name] [id number] 11-Jan-1966 51 y.o.  Nancy [name] is managed by Dr. 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 She was also instructed to continue using sublingual Ativan 3 times daily as needed, Marinol as ordered, and Phenergan as ordered. She will return to the office tomorrow for additional IV fluids and for IV antibiotics. Should her nausea and oral intake not be improved then we will likely admit her for ongoing fluids and anti-emetics this weekend.  Cholangiocarcinoma metastatic to the right lung: The patient is status post cycle 1 day 1 of FOLFOX dosed on 05/16/2017.  Right upper quadrant pain: The patient's right upper quadrant pain has improved since she has received cycle 1 of FOLFOX. She has only used 1 dose of Vicodin since her visit of yesterday.  Tachycardia: The patient's tachycardia is likely secondary to her pain and dehydration. A CBC returned with a WBC at 69.4. This was discussed with Dr. Burr Medico. The patient did receive her first dose of her of Neulasta yesterday. It is believed  that this is likely the cause of her elevated WBC. Blood cultures, urine and urine cultures were collected today. We will await these results and will act accordingly.  Dehydration. The patient was given 1500 miles of normal saline today and return tomorrow for additional IV normal saline.  Please see After Visit Summary for patient specific instructions.  Future Appointments Date Time Provider [place]  05/20/2017 1:00 PM Harle Stanford., PA-C [facility] None  05/24/2017 3:00 PM Roma Schanz R, DO [facility] None  05/30/2017 8:00 AM [facility] LAB 2 [facility] None  05/30/2017 8:15 AM [facility] FLUSH NURSE 2 [facility] None  05/30/2017 9:00 AM Truitt Merle, MD [facility] None  05/30/2017 10:00 AM [facility] J32 DNS [facility] None  06/01/2017 11:00 AM [facility] J32 DNS [facility] None  06/13/2017 8:45 AM [facility] LAB 2 [facility] None  06/13/2017 9:00 AM [facility] FLUSH NURSE [facility] None  06/13/2017 9:30 AM Alla Feeling, NP [facility] None  06/13/2017 10:30 AM [facility] I25 DNS [facility] None  06/15/2017 11:00 AM [facility] FLUSH NURSE 2 [facility] None    Orders Placed This Encounter  Procedures  . Urine Culture  . Culture, Blood  . Culture, Blood  . CBC & Diff and Retic  . Comprehensive metabolic panel  . Magnesium  . Urinalysis with microscopic       Subjective:   Patient ID:  Nancy [name] is a 51 y.o. (DOB [birth year]) female.  Chief Complaint: No chief complaint on file.   HPI Nancy [name] is a 51 year old female with a history of a metastatic intrahepatic cholangiocarcinoma with probable node and lung metastasis, stage IVB (T2b,  N1, M1) who was seen in the office yesterday for nausea, vomiting, abdominal pain and with a recent history of constipation transitioning to mild diarrhea after taking milk of magnesia. The patient was given IV fluids along with Zofran, Decadron, IV morphine, and sublingual Ativan. The patient was  having less pain in her back when she finished yesterday her nausea was slightly better controlled. She was given a prescription for sublingual Ativan to take at home. She has not yet begun this medication. She reports that her abdominal pain continues to be well controlled after her first cycle of FOLFOX earlier this week. She continues to have right back pain however. She continues to have significant nausea but no vomiting. She was able to take a few bites of food this morning without vomiting. She continues to have decreased oral intake. She has not taken but one Vicodin since yesterday. She took that last evening. She has not taken any more oxycodone extended release due to the fact that she has significant nausea after dosing with this medication. She presents to the office today with significant nausea and with continued right back pain. She reports having chills but no fevers. She has had continued diarrhea and has had 1 bowel movement last evening and one small bowel movement today. She denies fevers, sweats, abdominal pain, bright red blood per rectum, and melena. Her pain initially in her back was rated as 5/10 but less than to 2/10 after being dosed with IV morphine. She began Diflucan yesterday for her thrush. She has been using mouth rinses with warm water and baking soda.  Medications: I have reviewed the patient's current medications.  Allergies:  Allergies  Allergen Reactions  . Penicillins Anaphylaxis    Has patient had a PCN reaction causing immediate rash, facial/tongue/throat swelling, SOB or lightheadedness with hypotensionYes Swelling in throat  Has patient had a PCN reaction causing severe rash involving mucus membranes or skin necrosis:/No Has patient had a PCN reaction that required hospitalization/No Has patient had a PCN reaction occurring within the last 10 years:NO If all of the above answers are "NO", then may proceed with Cephalosporin use.   . Lipitor [Atorvastatin]  Palpitations    Tingling, flushing    Past Medical History:  Diagnosis Date  . Allergy    SEASONAL  . Anemia    HIGH SCHOOL  . Atopic eczema   . cholangio ca dx'd 07/12/16  . Fibrocystic breast disease   . GERD (gastroesophageal reflux disease)   . Hx of migraines    seasonal   . Pneumonia    6 years ago   . TIA (transient ischemic attack)     Past Surgical History:  Procedure Laterality Date  . EUS N/A 07/07/2016   Procedure: UPPER ENDOSCOPIC ULTRASOUND (EUS) RADIAL;  Surgeon: [name], MD;  Location: [facility];  Service: Endoscopy;  Laterality: N/A;  . IR GENERIC HISTORICAL  08/03/2016   IR US GUIDE VASC ACCESS RIGHT 08/03/2016 Corrie Mckusick, DO [facility]  . IR GENERIC HISTORICAL  08/03/2016   IR FLUORO GUIDE PORT INSERTION RIGHT 08/03/2016 Corrie Mckusick, DO [facility]  . MYOMECTOMY  2002    Family History  Problem Relation Age of Onset  . Gout Mother   . Diabetes Mother   . Hypertension Father   . Diabetes Father   . Breast cancer Paternal Aunt   . Cancer Maternal Grandfather        throat cancer   . Cancer Cousin 39  GI cancer   . Thyroid disease Neg Hx     Social History   Social History  . Marital status: Married    Spouse name: N/A  . Number of children: 0  . 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  [employer]   Social History Main Topics  . Smoking status: Never Smoker  . Smokeless tobacco: Never Used  . Alcohol use Yes     Comment: maybe 3 times a year  . Drug use: No  . Sexual activity: Yes    Partners: Male   Other Topics Concern  . Not on file   Social History Narrative  . No narrative on file    Past Medical History, Surgical history, Social history, and Family history were reviewed and updated as appropriate.   Please see review of systems for further details on the patient's review from today.   Review of Systems:  Review of Systems  Constitutional: Positive for chills and  fatigue. Negative for activity change, appetite change, diaphoresis and fever.  HENT: Negative for trouble swallowing.        Tenderness from thrush  Respiratory: Negative for cough, choking, chest tightness and shortness of breath.   Cardiovascular: Positive for palpitations. Negative for chest pain.  Gastrointestinal: Positive for diarrhea and nausea. Negative for abdominal pain, blood in stool and vomiting.  Musculoskeletal: Positive for back pain.  Neurological: Positive for weakness.    Objective:   Physical Exam:  BP 131/77 (BP Location: Right Arm, Patient Position: Sitting) Comment: Pt was to weak to stand  Pulse (!) 122 Comment: Made nurse aware  Temp 99.8 F (37.7 C) (Oral)   Resp 18   SpO2 100%  ECOG: 2  Physical Exam  HENT:  Head: Normocephalic and atraumatic.  Oral plaquing consistent with thrush is slightly improved from her visit from yesterday.  Cardiovascular: Normal heart sounds.  Tachycardia present.   No murmur heard. Pulmonary/Chest: Effort normal and breath sounds normal. No respiratory distress. She has no decreased breath sounds. She has no wheezes. She has no rhonchi. She has no rales.  Musculoskeletal: She exhibits no edema.  Neurological: She is alert.  Skin: Skin is warm and dry. No rash noted. She is not diaphoretic. No erythema.    Lab Review:     Component Value Date/Time   NA 139 05/19/2017 1438   K 3.8 05/19/2017 1438   CL 100 (L) 05/17/2017 0335   CO2 21 (L) 05/19/2017 1438   GLUCOSE 70 05/19/2017 1438   BUN 16.6 05/19/2017 1438   CREATININE 0.8 05/19/2017 1438   CALCIUM 9.4 05/19/2017 1438   PROT 7.4 05/19/2017 1438   ALBUMIN 4.5 05/19/2017 1438   AST 31 05/19/2017 1438   ALT 28 05/19/2017 1438   ALKPHOS 55 05/19/2017 1438   BILITOT 0.66 05/19/2017 1438   GFRNONAA >60 05/17/2017 0335   GFRAA >60 05/17/2017 0335       Component Value Date/Time   WBC 69.4 (HH) 05/19/2017 1438   WBC 4.0 05/17/2017 0335   RBC 4.02 05/19/2017 1438    RBC 3.99 05/17/2017 0335   HGB 11.4 (L) 05/19/2017 1438   HCT 35.5 05/19/2017 1438   PLT 273 05/19/2017 1438   MCV 88.2 05/19/2017 1438   MCH 28.5 05/19/2017 1438   MCH 29.6 05/17/2017 0335   MCHC 32.3 05/19/2017 1438   MCHC 34.4 05/17/2017 0335   RDW 16.5 (H) 05/19/2017 1438   LYMPHSABS 2.3  05/19/2017 1438   MONOABS 0.7 05/19/2017 1438   EOSABS 0.0 05/19/2017 1438   BASOSABS 0.1 05/19/2017 1438   -------------------------------  Imaging from last 24 hours (if applicable):  Radiology interpretation: Dg Abd 1 View  Result Date: 05/18/2017 CLINICAL DATA:  Three-week history of left upper quadrant abdominal pain acutely worsened 2 days ago. Current history of metastatic cholangiocarcinoma. EXAM: ABDOMEN - 1 VIEW COMPARISON:  CT abdomen and pelvis 04/13/2017, 01/25/2017 and earlier. FINDINGS: Bowel gas pattern unremarkable without evidence of obstruction or significant ileus. Expected stool burden in the colon. No suggestion of free air on the supine image. No visible opaque urinary tract calculi. Regional skeleton intact. IMPRESSION: No acute abdominal abnormality. Electronically Signed   By: Evangeline Dakin M.D.   On: 05/18/2017 10:56

## 2017-05-19 NOTE — Patient Instructions (Signed)
Nausea, Adult Nausea is the feeling of an upset stomach or having to vomit. Nausea on its own is not usually a serious concern, but it may be an early sign of a more serious medical problem. As nausea gets worse, it can lead to vomiting. If vomiting develops, or if you are not able to drink enough fluids, you are at risk of becoming dehydrated. Dehydration can make you tired and thirsty, cause you to have a dry mouth, and decrease how often you urinate. Older adults and people with other diseases or a weak immune system are at higher risk for dehydration. The main goals of treating your nausea are:  To limit repeated nausea episodes.  To prevent vomiting and dehydration.  Follow these instructions at home: Follow instructions from your health care provider about how to care for yourself at home. Eating and drinking Follow these recommendations as told by your health care provider:  Take an oral rehydration solution (ORS). This is a drink that is sold at pharmacies and retail stores.  Drink clear fluids in small amounts as you are able. Clear fluids include water, ice chips, diluted fruit juice, and low-calorie sports drinks.  Eat bland, easy-to-digest foods in small amounts as you are able. These foods include bananas, applesauce, rice, lean meats, toast, and crackers.  Avoid drinking fluids that contain a lot of sugar or caffeine, such as energy drinks, sports drinks, and soda.  Avoid alcohol.  Avoid spicy or fatty foods.  General instructions  Drink enough fluid to keep your urine clear or pale yellow.  Wash your hands often. If soap and water are not available, use hand sanitizer.  Make sure that all people in your household wash their hands well and often.  Rest at home while you recover.  Take over-the-counter and prescription medicines only as told by your health care provider.  Breathe slowly and deeply when you feel nauseous.  Watch your condition for any  changes.  Keep all follow-up visits as told by your health care provider. This is important. Contact a health care provider if:  You have a headache.  You have new symptoms.  Your nausea gets worse.  You have a fever.  You feel light-headed or dizzy.  You vomit.  You cannot keep fluids down. Get help right away if:  You have pain in your chest, neck, arm, or jaw.  You feel extremely weak or you faint.  You have vomit that is bright red or looks like coffee grounds.  You have bloody or black stools or stools that look like tar.  You have a severe headache, a stiff neck, or both.  You have severe pain, cramping, or bloating in your abdomen.  You have a rash.  You have difficulty breathing or are breathing very quickly.  Your heart is beating very quickly.  Your skin feels cold and clammy.  You feel confused.  You have pain when you urinate.  You have signs of dehydration, such as: ? Dark urine, very little, or no urine. ? Cracked lips. ? Dry mouth. ? Sunken eyes. ? Sleepiness. ? Weakness. These symptoms may represent a serious problem that is an emergency. Do not wait to see if the symptoms will go away. Get medical help right away. Call your local emergency services (911 in the [place] Do not drive yourself to the hospital. This information is not intended to replace advice given to you by your health care provider. Make sure you discuss any  questions you have with your health care provider. Document Released: 09/23/2004 Document Revised: 01/19/2016 Document Reviewed: 04/22/2015 Elsevier Interactive Patient Education  2017 [employer]. Morphine injection solution What is this medicine? MORPHINE (MOR feen) is a pain reliever. It is used to treat moderate to severe pain. This medicine may be used for other purposes; ask your health care provider or pharmacist if you have questions. COMMON BRAND NAME(S): Astramorph PF, Duramorph, Duramorph PF, Infumorph What  should I tell my health care provider before I take this medicine? They need to know if you have any of these conditions: -brain tumor -drug abuse or addiction -head injury -heart disease -if you often drink alcohol -liver disease -lung or breathing disease, like asthma -problems urinating -seizures -stomach or intestine problems -taken an MAOI like Carbex, Eldepryl, Marplan, Nardil, or Parnate in the last 14 days -an unusual or allergic reaction to morphine, other medications, foods, dyes, or preservatives -pregnant or trying to get pregnant -breast-feeding How should I use this medicine? This medicine is for injection into a muscle, vein, or under the skin. It is usually given by a health care professional in a hospital or clinic setting. If you get this medicine at home, you will be taught how to prepare and give this medicine. Use exactly as directed. Take your medicine at regular intervals. Do not take your medicine more often than directed. Always look at your medicine before using it. Do not use the injection if its color is darker than pale yellow or if it is discolored in any other way. Do not use this medicine if it is cloudy, thickened, colored, or has solid particles in it. It is important that you put your used needles and syringes in a special sharps container. Do not put them in a trash can. If you do not have a sharps container, call your pharmacist or healthcare provider to get one. Talk to your pediatrician regarding the use of this medicine in children. Special care may be needed. Overdosage: If you think you have taken too much of this medicine contact a poison control center or emergency room at once. NOTE: This medicine is only for you. Do not share this medicine with others. What if I miss a dose? If you miss a dose, take it as soon as you can. If it is almost time for your next dose, take only that dose. Do not take double or extra doses. What may interact with this  medicine? This medicine may interact with the following medications: -alcohol -antihistamines for allergy, cough and cold -atropine -certain medicines for anxiety or sleep -certain medicines for bladder problems like oxybutynin, tolterodine -certain medicines for depression like amitriptyline, fluoxetine, sertraline -certain medicines for Parkinson's disease like benztropine, trihexyphenidyl -certain medicines for seizures like phenobarbital, primidone -certain medicines for stomach problems like dicyclomine, hyoscyamine -certain medicines for travel sickness like scopolamine -cimetidine -general anesthetics like halothane, isoflurane, methoxyflurane, propofol -ipratropium -local anesthetics like lidocaine, pramoxine, tetracaine -MAOIs like Carbex, Eldepryl, Marplan, Nardil, and Parnate -medicines that relax muscles for surgery -other narcotic medicines for pain or cough -phenothiazines like chlorpromazine, mesoridazine, prochlorperazine, thioridazine This list may not describe all possible interactions. Give your health care provider a list of all the medicines, herbs, non-prescription drugs, or dietary supplements you use. Also tell them if you smoke, drink alcohol, or use illegal drugs. Some items may interact with your medicine. What should I watch for while using this medicine? Tell your doctor or health care professional if your pain does not  go away, if it gets worse, or if you have new or a different type of pain. You may develop tolerance to the medicine. Tolerance means that you will need a higher dose of the medicine for pain relief. Tolerance is normal and is expected if you take this medicine for a long time. Do not suddenly stop taking your medicine because you may develop a severe reaction. Your body becomes used to the medicine. This does NOT mean you are addicted. Addiction is a behavior related to getting and using a drug for a non-medical reason. If you have pain, you have a  medical reason to take pain medicine. Your doctor will tell you how much medicine to take. If your doctor wants you to stop the medicine, the dose will be slowly lowered over time to avoid any side effects. There are different types of narcotic medicines (opiates). If you take more than one type at the same time or if you are taking another medicine that also causes drowsiness, you may have more side effects. Give your health care provider a list of all medicines you use. Your doctor will tell you how much medicine to take. Do not take more medicine than directed. Call emergency for help if you have problems breathing or unusual sleepiness. You may get drowsy or dizzy. Do not drive, use machinery, or do anything that needs mental alertness until you know how this medicine affects you. Do not stand or sit up quickly, especially if you are an older patient. This reduces the risk of dizzy or fainting spells. Alcohol may interfere with the effect of this medicine. Avoid alcoholic drinks. This medicine will cause constipation. Try to have a bowel movement at least every 2 to 3 days. If you do not have a bowel movement for 3 days, call your doctor or health care professional. Your mouth may get dry. Chewing sugarless gum or sucking hard candy, and drinking plenty of water may help. Contact your doctor if the problem does not go away or is severe. What side effects may I notice from receiving this medicine? Side effects that you should report to your doctor or health care professional as soon as possible: -allergic reactions like skin rash, itching or hives, swelling of the face, lips, or tongue -breathing problems -confusion -seizures -signs and symptoms of low blood pressure like dizziness; feeling faint or lightheaded, falls; unusually weak or tired -trouble passing urine or change in the amount of urine Side effects that usually do not require medical attention (report to your doctor or health care  professional if they continue or are bothersome): -constipation -dry mouth -nausea, vomiting -tiredness This list may not describe all possible side effects. Call your doctor for medical advice about side effects. You may report side effects to FDA at 1-800-FDA-1088. Where should I keep my medicine? Keep out of the reach of children. This medicine can be abused. Keep it in a safe place to protect it from theft. Do not share this medicine with anyone. Selling or giving away this medicine is dangerous and is against the law. If you are using this medicine at home, you will be instructed on how to store this medicine. Throw away any unused medicine after the expiration date on the label. Discard unused medicine and used packaging carefully. Pets and children can be harmed if they find used or lost packages. NOTE: This sheet is a summary. It may not cover all possible information. If you have questions about this medicine, talk  to your doctor, pharmacist, or health care provider.  2018 Elsevier/Gold Standard (2015-05-12 21:02:25) Lorazepam tablets What is this medicine? LORAZEPAM (lor A ze pam) is a benzodiazepine. It is used to treat anxiety. This medicine may be used for other purposes; ask your health care provider or pharmacist if you have questions. COMMON BRAND NAME(S): Ativan What should I tell my health care provider before I take this medicine? They need to know if you have any of these conditions: -glaucoma -history of drug or alcohol abuse problem -kidney disease -liver disease -lung or breathing disease, like asthma -mental illness -myasthenia gravis -Parkinson's disease -suicidal thoughts, plans, or attempt; a previous suicide attempt by you or a family member -an unusual or allergic reaction to lorazepam, other medicines, foods, dyes, or preservatives -pregnant or trying to get pregnant -breast-feeding How should I use this medicine? Take this medicine by mouth with a glass  of water. Follow the directions on the prescription label. Take your medicine at regular intervals. Do not take it more often than directed. Do not stop taking except on your doctor's advice. A special MedGuide will be given to you by the pharmacist with each prescription and refill. Be sure to read this information carefully each time. Talk to your pediatrician regarding the use of this medicine in children. While this drug may be used in children as young as 12 years for selected conditions, precautions do apply. Overdosage: If you think you have taken too much of this medicine contact a poison control center or emergency room at once. NOTE: This medicine is only for you. Do not share this medicine with others. What if I miss a dose? If you miss a dose, take it as soon as you can. If it is almost time for your next dose, take only that dose. Do not take double or extra doses. What may interact with this medicine? Do not take this medicine with any of the following medications: -narcotic medicines for cough -sodium oxybate This medicine may also interact with the following medications: -alcohol -antihistamines for allergy, cough and cold -certain medicines for anxiety or sleep -certain medicines for depression, like amitriptyline, fluoxetine, sertraline -certain medicines for seizures like carbamazepine, phenobarbital, phenytoin, primidone -general anesthetics like lidocaine, pramoxine, tetracaine -MAOIs like Carbex, Eldepryl, Marplan, Nardil, and Parnate -medicines that relax muscles for surgery -narcotic medicines for pain -phenothiazines like chlorpromazine, mesoridazine, prochlorperazine, thioridazine This list may not describe all possible interactions. Give your health care provider a list of all the medicines, herbs, non-prescription drugs, or dietary supplements you use. Also tell them if you smoke, drink alcohol, or use illegal drugs. Some items may interact with your medicine. What  should I watch for while using this medicine? Tell your doctor or health care professional if your symptoms do not start to get better or if they get worse. Do not stop taking except on your doctor's advice. You may develop a severe reaction. Your doctor will tell you how much medicine to take. You may get drowsy or dizzy. Do not drive, use machinery, or do anything that needs mental alertness until you know how this medicine affects you. To reduce the risk of dizzy and fainting spells, do not stand or sit up quickly, especially if you are an older patient. Alcohol may increase dizziness and drowsiness. Avoid alcoholic drinks. If you are taking another medicine that also causes drowsiness, you may have more side effects. Give your health care provider a list of all medicines you use. Your doctor will  tell you how much medicine to take. Do not take more medicine than directed. Call emergency for help if you have problems breathing or unusual sleepiness. What side effects may I notice from receiving this medicine? Side effects that you should report to your doctor or health care professional as soon as possible: -allergic reactions like skin rash, itching or hives, swelling of the face, lips, or tongue -breathing problems -confusion -loss of balance or coordination -signs and symptoms of low blood pressure like dizziness; feeling faint or lightheaded, falls; unusually weak or tired -suicidal thoughts or other mood changes Side effects that usually do not require medical attention (report to your doctor or health care professional if they continue or are bothersome): -dizziness -headache -nausea, vomiting -tiredness This list may not describe all possible side effects. Call your doctor for medical advice about side effects. You may report side effects to FDA at 1-800-FDA-1088. Where should I keep my medicine? Keep out of the reach of children. This medicine can be abused. Keep your medicine in a  safe place to protect it from theft. Do not share this medicine with anyone. Selling or giving away this medicine is dangerous and against the law. This medicine may cause accidental overdose and death if taken by other adults, children, or pets. Mix any unused medicine with a substance like cat litter or coffee grounds. Then throw the medicine away in a sealed container like a sealed bag or a coffee can with a lid. Do not use the medicine after the expiration date. Store at room temperature between 20 and 25 degrees C (68 and 77 degrees F). Protect from light. Keep container tightly closed. NOTE: This sheet is a summary. It may not cover all possible information. If you have questions about this medicine, talk to your doctor, pharmacist, or health care provider.  2018 Elsevier/Gold Standard (2015-05-15 15:54:27) Dexamethasone injection What is this medicine? DEXAMETHASONE (dex a METH a sone) is a corticosteroid. It is used to treat inflammation of the skin, joints, lungs, and other organs. Common conditions treated include asthma, allergies, and arthritis. It is also used for other conditions, like blood disorders and diseases of the adrenal glands. This medicine may be used for other purposes; ask your health care provider or pharmacist if you have questions. COMMON BRAND NAME(S): Decadron, DoubleDex, Simplist Dexamethasone, Solurex What should I tell my health care provider before I take this medicine? They need to know if you have any of these conditions: -blood clotting problems -Cushing's syndrome -diabetes -glaucoma -heart problems or disease -high blood pressure -infection like herpes, measles, tuberculosis, or chickenpox -kidney disease -liver disease -mental problems -myasthenia gravis -osteoporosis -previous heart attack -seizures -stomach, ulcer or intestine disease including colitis and diverticulitis -thyroid problem -an unusual or allergic reaction to dexamethasone,  corticosteroids, other medicines, lactose, foods, dyes, or preservatives -pregnant or trying to get pregnant -breast-feeding How should I use this medicine? This medicine is for injection into a muscle, joint, lesion, soft tissue, or vein. It is given by a health care professional in a hospital or clinic setting. Talk to your pediatrician regarding the use of this medicine in children. Special care may be needed. Overdosage: If you think you have taken too much of this medicine contact a poison control center or emergency room at once. NOTE: This medicine is only for you. Do not share this medicine with others. What if I miss a dose? This may not apply. If you are having a series of injections over a prolonged  period, try not to miss an appointment. Call your doctor or health care professional to reschedule if you are unable to keep an appointment. What may interact with this medicine? Do not take this medicine with any of the following medications: -mifepristone, RU-486 -vaccines This medicine may also interact with the following medications: -amphotericin B -antibiotics like clarithromycin, erythromycin, and troleandomycin -aspirin and aspirin-like drugs -barbiturates like phenobarbital -carbamazepine -cholestyramine -cholinesterase inhibitors like donepezil, galantamine, rivastigmine, and tacrine -cyclosporine -digoxin -diuretics -ephedrine -female hormones, like estrogens or progestins and birth control pills -indinavir -isoniazid -ketoconazole -medicines for diabetes -medicines that improve muscle tone or strength for conditions like myasthenia gravis -NSAIDs, medicines for pain and inflammation, like ibuprofen or naproxen -phenytoin -rifampin -thalidomide -warfarin This list may not describe all possible interactions. Give your health care provider a list of all the medicines, herbs, non-prescription drugs, or dietary supplements you use. Also tell them if you smoke, drink  alcohol, or use illegal drugs. Some items may interact with your medicine. What should I watch for while using this medicine? Your condition will be monitored carefully while you are receiving this medicine. If you are taking this medicine for a long time, carry an identification card with your name and address, the type and dose of your medicine, and your doctor's name and address. This medicine may increase your risk of getting an infection. Stay away from people who are sick. Tell your doctor or health care professional if you are around anyone with measles or chickenpox. Talk to your health care provider before you get any vaccines that you take this medicine. If you are going to have surgery, tell your doctor or health care professional that you have taken this medicine within the last twelve months. Ask your doctor or health care professional about your diet. You may need to lower the amount of salt you eat. The medicine can increase your blood sugar. If you are a diabetic check with your doctor if you need help adjusting the dose of your diabetic medicine. What side effects may I notice from receiving this medicine? Side effects that you should report to your doctor or health care professional as soon as possible: -allergic reactions like skin rash, itching or hives, swelling of the face, lips, or tongue -black or tarry stools -change in the amount of urine -changes in vision -confusion, excitement, restlessness, a false sense of well-being -fever, sore throat, sneezing, cough, or other signs of infection, wounds that will not heal -hallucinations -increased thirst -mental depression, mood swings, mistaken feelings of self importance or of being mistreated -pain in hips, back, ribs, arms, shoulders, or legs -pain, redness, or irritation at the injection site -redness, blistering, peeling or loosening of the skin, including inside the mouth -rounding out of face -swelling of feet or  lower legs -unusual bleeding or bruising -unusual tired or weak -wounds that do not heal Side effects that usually do not require medical attention (report to your doctor or health care professional if they continue or are bothersome): -diarrhea or constipation -change in taste -headache -nausea, vomiting -skin problems, acne, thin and shiny skin -touble sleeping -unusual growth of hair on the face or body -weight gain This list may not describe all possible side effects. Call your doctor for medical advice about side effects. You may report side effects to FDA at 1-800-FDA-1088. Where should I keep my medicine? This drug is given in a hospital or clinic and will not be stored at home. NOTE: This sheet is a summary.  It may not cover all possible information. If you have questions about this medicine, talk to your doctor, pharmacist, or health care provider.  2018 Elsevier/Gold Standard (2007-12-07 14:04:12) Ondansetron injection What is this medicine? ONDANSETRON (on DAN se tron) is used to treat nausea and vomiting caused by chemotherapy. It is also used to prevent or treat nausea and vomiting after surgery. This medicine may be used for other purposes; ask your health care provider or pharmacist if you have questions. COMMON BRAND NAME(S): Zofran What should I tell my health care provider before I take this medicine? They need to know if you have any of these conditions: -heart disease -history of irregular heartbeat -liver disease -low levels of magnesium or potassium in the blood -an unusual or allergic reaction to ondansetron, granisetron, other medicines, foods, dyes, or preservatives -pregnant or trying to get pregnant -breast-feeding How should I use this medicine? This medicine is for infusion into a vein. It is given by a health care professional in a hospital or clinic setting. Talk to your pediatrician regarding the use of this medicine in children. Special care may be  needed. Overdosage: If you think you have taken too much of this medicine contact a poison control center or emergency room at once. NOTE: This medicine is only for you. Do not share this medicine with others. What if I miss a dose? This does not apply. What may interact with this medicine? Do not take this medicine with any of the following medications: -apomorphine -certain medicines for fungal infections like fluconazole, itraconazole, ketoconazole, posaconazole, voriconazole -cisapride -dofetilide -dronedarone -pimozide -thioridazine -ziprasidone This medicine may also interact with the following medications: -carbamazepine -certain medicines for depression, anxiety, or psychotic disturbances -fentanyl -linezolid -MAOIs like Carbex, Eldepryl, Marplan, Nardil, and Parnate -methylene blue (injected into a vein) -other medicines that prolong the QT interval (cause an abnormal heart rhythm) -phenytoin -rifampicin -tramadol This list may not describe all possible interactions. Give your health care provider a list of all the medicines, herbs, non-prescription drugs, or dietary supplements you use. Also tell them if you smoke, drink alcohol, or use illegal drugs. Some items may interact with your medicine. What should I watch for while using this medicine? Your condition will be monitored carefully while you are receiving this medicine. What side effects may I notice from receiving this medicine? Side effects that you should report to your doctor or health care professional as soon as possible: -allergic reactions like skin rash, itching or hives, swelling of the face, lips, or tongue -breathing problems -confusion -dizziness -fast or irregular heartbeat -feeling faint or lightheaded, falls -fever and chills -loss of balance or coordination -seizures -sweating -swelling of the hands and feet -tightness in the chest -tremors -unusually weak or tired Side effects that usually  do not require medical attention (report to your doctor or health care professional if they continue or are bothersome): -constipation or diarrhea -headache This list may not describe all possible side effects. Call your doctor for medical advice about side effects. You may report side effects to FDA at 1-800-FDA-1088. Where should I keep my medicine? This drug is given in a hospital or clinic and will not be stored at home. NOTE: This sheet is a summary. It may not cover all possible information. If you have questions about this medicine, talk to your doctor, pharmacist, or health care provider.  2018 Elsevier/Gold Standard (2013-05-23 16:18:28)

## 2017-05-20 ENCOUNTER — Ambulatory Visit: Payer: Federal, State, Local not specified - PPO | Admitting: Medical

## 2017-05-20 ENCOUNTER — Ambulatory Visit (HOSPITAL_BASED_OUTPATIENT_CLINIC_OR_DEPARTMENT_OTHER): Payer: Federal, State, Local not specified - PPO | Admitting: Medical

## 2017-05-20 ENCOUNTER — Other Ambulatory Visit: Payer: Self-pay | Admitting: Medical

## 2017-05-20 VITALS — BP 147/84 | HR 97 | Temp 99.0°F | Resp 16 | Ht 66.0 in | Wt 133.0 lb

## 2017-05-20 DIAGNOSIS — C7801 Secondary malignant neoplasm of right lung: Secondary | ICD-10-CM | POA: Diagnosis not present

## 2017-05-20 DIAGNOSIS — M545 Low back pain: Secondary | ICD-10-CM | POA: Diagnosis not present

## 2017-05-20 DIAGNOSIS — E860 Dehydration: Secondary | ICD-10-CM

## 2017-05-20 DIAGNOSIS — C221 Intrahepatic bile duct carcinoma: Secondary | ICD-10-CM | POA: Diagnosis not present

## 2017-05-20 DIAGNOSIS — G893 Neoplasm related pain (acute) (chronic): Secondary | ICD-10-CM

## 2017-05-20 LAB — URINE CULTURE: Organism ID, Bacteria: NO GROWTH

## 2017-05-20 MED ORDER — MORPHINE SULFATE (PF) 4 MG/ML IV SOLN
INTRAVENOUS | Status: AC
  Filled 2017-05-20: qty 1

## 2017-05-20 MED ORDER — SODIUM CHLORIDE 0.9 % IV SOLN
Freq: Once | INTRAVENOUS | Status: AC
  Administered 2017-05-20: 13:00:00 via INTRAVENOUS

## 2017-05-20 MED ORDER — SODIUM CHLORIDE 0.9 % IV SOLN: INTRAVENOUS | Status: DC

## 2017-05-20 MED ORDER — SODIUM CHLORIDE 0.9% FLUSH
10.0000 mL | Freq: Once | INTRAVENOUS | Status: AC
  Administered 2017-05-20: 10 mL
  Filled 2017-05-20: qty 10

## 2017-05-20 MED ORDER — HEPARIN SOD (PORK) LOCK FLUSH 100 UNIT/ML IV SOLN
500.0000 [IU] | Freq: Once | INTRAVENOUS | Status: AC
  Administered 2017-05-20: 500 [IU]
  Filled 2017-05-20: qty 5

## 2017-05-20 MED ORDER — MORPHINE SULFATE 4 MG/ML IJ SOLN
2.0000 mg | Freq: Once | INTRAMUSCULAR | Status: AC
  Administered 2017-05-20: 2 mg via INTRAVENOUS
  Filled 2017-05-20: qty 1

## 2017-05-20 NOTE — Progress Notes (Signed)
The patient was seen for IV fluids only today.

## 2017-05-20 NOTE — Patient Instructions (Signed)
Dehydration, Adult Dehydration is when there is not enough fluid or water in your body. This happens when you lose more fluids than you take in. Dehydration can range from mild to very bad. It should be treated right away to keep it from getting very bad. Symptoms of mild dehydration may include:  Thirst.  Dry lips.  Slightly dry mouth.  Dry, warm skin.  Dizziness. Symptoms of moderate dehydration may include:  Very dry mouth.  Muscle cramps.  Dark pee (urine). Pee may be the color of tea.  Your body making less pee.  Your eyes making fewer tears.  Heartbeat that is uneven or faster than normal (palpitations).  Headache.  Light-headedness, especially when you stand up from sitting.  Fainting (syncope). Symptoms of very bad dehydration may include:  Changes in skin, such as: ? Cold and clammy skin. ? Blotchy (mottled) or pale skin. ? Skin that does not quickly return to normal after being lightly pinched and let go (poor skin turgor).  Changes in body fluids, such as: ? Feeling very thirsty. ? Your eyes making fewer tears. ? Not sweating when body temperature is high, such as in hot weather. ? Your body making very little pee.  Changes in vital signs, such as: ? Weak pulse. ? Pulse that is more than 100 beats a minute when you are sitting still. ? Fast breathing. ? Low blood pressure.  Other changes, such as: ? Sunken eyes. ? Cold hands and feet. ? Confusion. ? Lack of energy (lethargy). ? Trouble waking up from sleep. ? Short-term weight loss. ? Unconsciousness. Follow these instructions at home:  If told by your doctor, drink an ORS: ? Make an ORS by using instructions on the package. ? Start by drinking small amounts, about  cup (120 mL) every 5-10 minutes. ? Slowly drink more until you have had the amount that your doctor said to have.  Drink enough clear fluid to keep your pee clear or pale yellow. If you were told to drink an ORS, finish the ORS  first, then start slowly drinking clear fluids. Drink fluids such as: ? Water. Do not drink only water by itself. Doing that can make the salt (sodium) level in your body get too low (hyponatremia). ? Ice chips. ? Fruit juice that you have added water to (diluted). ? Low-calorie sports drinks.  Avoid: ? Alcohol. ? Drinks that have a lot of sugar. These include high-calorie sports drinks, fruit juice that does not have water added, and soda. ? Caffeine. ? Foods that are greasy or have a lot of fat or sugar.  Take over-the-counter and prescription medicines only as told by your doctor.  Do not take salt tablets. Doing that can make the salt level in your body get too high (hypernatremia).  Eat foods that have minerals (electrolytes). Examples include bananas, oranges, potatoes, tomatoes, and spinach.  Keep all follow-up visits as told by your doctor. This is important. Contact a doctor if:  You have belly (abdominal) pain that: ? Gets worse. ? Stays in one area (localizes).  You have a rash.  You have a stiff neck.  You get angry or annoyed more easily than normal (irritability).  You are more sleepy than normal.  You have a harder time waking up than normal.  You feel: ? Weak. ? Dizzy. ? Very thirsty.  You have peed (urinated) only a small amount of very dark pee during 6-8 hours. Get help right away if:  You have symptoms of   very bad dehydration.  You cannot drink fluids without throwing up (vomiting).  Your symptoms get worse with treatment.  You have a fever.  You have a very bad headache.  You are throwing up or having watery poop (diarrhea) and it: ? Gets worse. ? Does not go away.  You have blood or something green (bile) in your throw-up.  You have blood in your poop (stool). This may cause poop to look black and tarry.  You have not peed in 6-8 hours.  You pass out (faint).  Your heart rate when you are sitting still is more than 100 beats a  minute.  You have trouble breathing. This information is not intended to replace advice given to you by your health care provider. Make sure you discuss any questions you have with your health care provider. Document Released: 06/12/2009 Document Revised: 03/05/2016 Document Reviewed: 10/10/2015 Elsevier Interactive Patient Education  2018 [employer]  

## 2017-05-21 ENCOUNTER — Ambulatory Visit (HOSPITAL_BASED_OUTPATIENT_CLINIC_OR_DEPARTMENT_OTHER): Payer: Federal, State, Local not specified - PPO

## 2017-05-21 VITALS — BP 155/83 | HR 83 | Temp 99.3°F | Resp 18

## 2017-05-21 DIAGNOSIS — C221 Intrahepatic bile duct carcinoma: Secondary | ICD-10-CM | POA: Diagnosis not present

## 2017-05-21 DIAGNOSIS — C7801 Secondary malignant neoplasm of right lung: Secondary | ICD-10-CM

## 2017-05-21 MED ORDER — SODIUM CHLORIDE 0.9% FLUSH
10.0000 mL | INTRAVENOUS | Status: DC | PRN
  Administered 2017-05-21: 10 mL
  Filled 2017-05-21: qty 10

## 2017-05-21 MED ORDER — ALTEPLASE 2 MG IJ SOLR
2.0000 mg | Freq: Once | INTRAMUSCULAR | Status: DC | PRN
  Filled 2017-05-21: qty 2

## 2017-05-21 MED ORDER — ONDANSETRON HCL 4 MG/2ML IJ SOLN
INTRAMUSCULAR | Status: AC
Start: 2017-05-21 — End: 2017-05-21
  Filled 2017-05-21: qty 4

## 2017-05-21 MED ORDER — HEPARIN SOD (PORK) LOCK FLUSH 100 UNIT/ML IV SOLN
250.0000 [IU] | Freq: Once | INTRAVENOUS | Status: DC | PRN
  Filled 2017-05-21: qty 5

## 2017-05-21 MED ORDER — SODIUM CHLORIDE 0.9 % IV SOLN
Freq: Once | INTRAVENOUS | Status: DC
  Administered 2017-05-21: 12:00:00 via INTRAVENOUS

## 2017-05-21 MED ORDER — SODIUM CHLORIDE 0.9 % IV SOLN
Freq: Once | INTRAVENOUS | Status: AC
  Administered 2017-05-21: 10:00:00 via INTRAVENOUS

## 2017-05-21 MED ORDER — HEPARIN SOD (PORK) LOCK FLUSH 100 UNIT/ML IV SOLN
500.0000 [IU] | Freq: Once | INTRAVENOUS | Status: AC | PRN
  Administered 2017-05-21: 500 [IU]
  Filled 2017-05-21: qty 5

## 2017-05-21 NOTE — Progress Notes (Signed)
Patient developed increase nausea at the end of IVF infusion.  Zofran 8 mg given over 15 minutes via IVP.  Nausea had subsided and patient states that she feels better. Post vital signs stable. Discharged home with husband.

## 2017-05-21 NOTE — Patient Instructions (Signed)
Dehydration, Adult Dehydration is when there is not enough fluid or water in your body. This happens when you lose more fluids than you take in. Dehydration can range from mild to very bad. It should be treated right away to keep it from getting very bad. Symptoms of mild dehydration may include:  Thirst.  Dry lips.  Slightly dry mouth.  Dry, warm skin.  Dizziness. Symptoms of moderate dehydration may include:  Very dry mouth.  Muscle cramps.  Dark pee (urine). Pee may be the color of tea.  Your body making less pee.  Your eyes making fewer tears.  Heartbeat that is uneven or faster than normal (palpitations).  Headache.  Light-headedness, especially when you stand up from sitting.  Fainting (syncope). Symptoms of very bad dehydration may include:  Changes in skin, such as: ? Cold and clammy skin. ? Blotchy (mottled) or pale skin. ? Skin that does not quickly return to normal after being lightly pinched and let go (poor skin turgor).  Changes in body fluids, such as: ? Feeling very thirsty. ? Your eyes making fewer tears. ? Not sweating when body temperature is high, such as in hot weather. ? Your body making very little pee.  Changes in vital signs, such as: ? Weak pulse. ? Pulse that is more than 100 beats a minute when you are sitting still. ? Fast breathing. ? Low blood pressure.  Other changes, such as: ? Sunken eyes. ? Cold hands and feet. ? Confusion. ? Lack of energy (lethargy). ? Trouble waking up from sleep. ? Short-term weight loss. ? Unconsciousness. Follow these instructions at home:  If told by your doctor, drink an ORS: ? Make an ORS by using instructions on the package. ? Start by drinking small amounts, about  cup (120 mL) every 5-10 minutes. ? Slowly drink more until you have had the amount that your doctor said to have.  Drink enough clear fluid to keep your pee clear or pale yellow. If you were told to drink an ORS, finish the ORS  first, then start slowly drinking clear fluids. Drink fluids such as: ? Water. Do not drink only water by itself. Doing that can make the salt (sodium) level in your body get too low (hyponatremia). ? Ice chips. ? Fruit juice that you have added water to (diluted). ? Low-calorie sports drinks.  Avoid: ? Alcohol. ? Drinks that have a lot of sugar. These include high-calorie sports drinks, fruit juice that does not have water added, and soda. ? Caffeine. ? Foods that are greasy or have a lot of fat or sugar.  Take over-the-counter and prescription medicines only as told by your doctor.  Do not take salt tablets. Doing that can make the salt level in your body get too high (hypernatremia).  Eat foods that have minerals (electrolytes). Examples include bananas, oranges, potatoes, tomatoes, and spinach.  Keep all follow-up visits as told by your doctor. This is important. Contact a doctor if:  You have belly (abdominal) pain that: ? Gets worse. ? Stays in one area (localizes).  You have a rash.  You have a stiff neck.  You get angry or annoyed more easily than normal (irritability).  You are more sleepy than normal.  You have a harder time waking up than normal.  You feel: ? Weak. ? Dizzy. ? Very thirsty.  You have peed (urinated) only a small amount of very dark pee during 6-8 hours. Get help right away if:  You have symptoms of   very bad dehydration.  You cannot drink fluids without throwing up (vomiting).  Your symptoms get worse with treatment.  You have a fever.  You have a very bad headache.  You are throwing up or having watery poop (diarrhea) and it: ? Gets worse. ? Does not go away.  You have blood or something green (bile) in your throw-up.  You have blood in your poop (stool). This may cause poop to look black and tarry.  You have not peed in 6-8 hours.  You pass out (faint).  Your heart rate when you are sitting still is more than 100 beats a  minute.  You have trouble breathing. This information is not intended to replace advice given to you by your health care provider. Make sure you discuss any questions you have with your health care provider. Document Released: 06/12/2009 Document Revised: 03/05/2016 Document Reviewed: 10/10/2015 Elsevier Interactive Patient Education  2018 [employer]  

## 2017-05-23 ENCOUNTER — Ambulatory Visit (HOSPITAL_BASED_OUTPATIENT_CLINIC_OR_DEPARTMENT_OTHER): Payer: Federal, State, Local not specified - PPO | Admitting: Medical

## 2017-05-23 ENCOUNTER — Telehealth: Payer: Self-pay

## 2017-05-23 ENCOUNTER — Other Ambulatory Visit: Payer: Federal, State, Local not specified - PPO

## 2017-05-23 ENCOUNTER — Ambulatory Visit: Payer: Federal, State, Local not specified - PPO

## 2017-05-23 VITALS — BP 133/88 | HR 84 | Temp 98.8°F | Resp 16 | Ht 66.0 in | Wt 123.1 lb

## 2017-05-23 DIAGNOSIS — C221 Intrahepatic bile duct carcinoma: Secondary | ICD-10-CM

## 2017-05-23 DIAGNOSIS — R630 Anorexia: Secondary | ICD-10-CM | POA: Diagnosis not present

## 2017-05-23 DIAGNOSIS — C7800 Secondary malignant neoplasm of unspecified lung: Secondary | ICD-10-CM | POA: Diagnosis not present

## 2017-05-23 DIAGNOSIS — R110 Nausea: Secondary | ICD-10-CM | POA: Diagnosis not present

## 2017-05-23 DIAGNOSIS — R634 Abnormal weight loss: Secondary | ICD-10-CM | POA: Diagnosis not present

## 2017-05-23 DIAGNOSIS — M545 Low back pain: Secondary | ICD-10-CM | POA: Diagnosis not present

## 2017-05-23 MED ORDER — SODIUM CHLORIDE 0.9 % IV SOLN
Freq: Once | INTRAVENOUS | Status: AC
Start: 2017-05-23 — End: 2017-05-23
  Administered 2017-05-23: 12:00:00 via INTRAVENOUS

## 2017-05-23 MED ORDER — ONDANSETRON HCL 4 MG/2ML IJ SOLN: 8.0000 mg | Freq: Once | INTRAMUSCULAR | Status: AC

## 2017-05-23 MED ORDER — SODIUM CHLORIDE 0.9% FLUSH
10.0000 mL | Freq: Once | INTRAVENOUS | Status: AC
  Administered 2017-05-23: 10 mL via INTRAVENOUS
  Filled 2017-05-23: qty 10

## 2017-05-23 MED ORDER — HEPARIN SOD (PORK) LOCK FLUSH 100 UNIT/ML IV SOLN
500.0000 [IU] | Freq: Once | INTRAVENOUS | Status: AC
Start: 2017-05-23 — End: 2017-05-23
  Administered 2017-05-23: 500 [IU] via INTRAVENOUS
  Filled 2017-05-23: qty 5

## 2017-05-23 MED ORDER — OLANZAPINE 2.5 MG PO TABS: 2.5000 mg | ORAL_TABLET | Freq: Every day | ORAL | 1 refills | Status: DC

## 2017-05-23 NOTE — Patient Instructions (Signed)
Nausea, Adult Nausea is the feeling of an upset stomach or having to vomit. Nausea on its own is not usually a serious concern, but it may be an early sign of a more serious medical problem. As nausea gets worse, it can lead to vomiting. If vomiting develops, or if you are not able to drink enough fluids, you are at risk of becoming dehydrated. Dehydration can make you tired and thirsty, cause you to have a dry mouth, and decrease how often you urinate. Older adults and people with other diseases or a weak immune system are at higher risk for dehydration. The main goals of treating your nausea are:  To limit repeated nausea episodes.  To prevent vomiting and dehydration.  Follow these instructions at home: Follow instructions from your health care provider about how to care for yourself at home. Eating and drinking Follow these recommendations as told by your health care provider:  Take an oral rehydration solution (ORS). This is a drink that is sold at pharmacies and retail stores.  Drink clear fluids in small amounts as you are able. Clear fluids include water, ice chips, diluted fruit juice, and low-calorie sports drinks.  Eat bland, easy-to-digest foods in small amounts as you are able. These foods include bananas, applesauce, rice, lean meats, toast, and crackers.  Avoid drinking fluids that contain a lot of sugar or caffeine, such as energy drinks, sports drinks, and soda.  Avoid alcohol.  Avoid spicy or fatty foods.  General instructions  Drink enough fluid to keep your urine clear or pale yellow.  Wash your hands often. If soap and water are not available, use hand sanitizer.  Make sure that all people in your household wash their hands well and often.  Rest at home while you recover.  Take over-the-counter and prescription medicines only as told by your health care provider.  Breathe slowly and deeply when you feel nauseous.  Watch your condition for any  changes.  Keep all follow-up visits as told by your health care provider. This is important. Contact a health care provider if:  You have a headache.  You have new symptoms.  Your nausea gets worse.  You have a fever.  You feel light-headed or dizzy.  You vomit.  You cannot keep fluids down. Get help right away if:  You have pain in your chest, neck, arm, or jaw.  You feel extremely weak or you faint.  You have vomit that is bright red or looks like coffee grounds.  You have bloody or black stools or stools that look like tar.  You have a severe headache, a stiff neck, or both.  You have severe pain, cramping, or bloating in your abdomen.  You have a rash.  You have difficulty breathing or are breathing very quickly.  Your heart is beating very quickly.  Your skin feels cold and clammy.  You feel confused.  You have pain when you urinate.  You have signs of dehydration, such as: ? Dark urine, very little, or no urine. ? Cracked lips. ? Dry mouth. ? Sunken eyes. ? Sleepiness. ? Weakness. These symptoms may represent a serious problem that is an emergency. Do not wait to see if the symptoms will go away. Get medical help right away. Call your local emergency services (911 in the [place] Do not drive yourself to the hospital. This information is not intended to replace advice given to you by your health care provider. Make sure you discuss any questions you have  with your health care provider. Document Released: 09/23/2004 Document Revised: 01/19/2016 Document Reviewed: 04/22/2015 Elsevier Interactive Patient Education  2017 Elsevier Inc.    Dehydration, Adult Dehydration is a condition in which there is not enough fluid or water in the body. This happens when you lose more fluids than you take in. Important organs, such as the kidneys, brain, and heart, cannot function without a proper amount of fluids. Any loss of fluids from the body can lead to  dehydration. Dehydration can range from mild to severe. This condition should be treated right away to prevent it from becoming severe. What are the causes? This condition may be caused by:  Vomiting.  Diarrhea.  Excessive sweating, such as from heat exposure or exercise.  Not drinking enough fluid, especially: ? When ill. ? While doing activity that requires a lot of energy.  Excessive urination.  Fever.  Infection.  Certain medicines, such as medicines that cause the body to lose excess fluid (diuretics).  Inability to access safe drinking water.  Reduced physical ability to get adequate water and food.  What increases the risk? This condition is more likely to develop in people:  Who have a poorly controlled long-term (chronic) illness, such as diabetes, heart disease, or kidney disease.  Who are age 6 or older.  Who are disabled.  Who live in a place with high altitude.  Who play endurance sports.  What are the signs or symptoms? Symptoms of mild dehydration may include:  Thirst.  Dry lips.  Slightly dry mouth.  Dry, warm skin.  Dizziness. Symptoms of moderate dehydration may include:  Very dry mouth.  Muscle cramps.  Dark urine. Urine may be the color of tea.  Decreased urine production.  Decreased tear production.  Heartbeat that is irregular or faster than normal (palpitations).  Headache.  Light-headedness, especially when you stand up from a sitting position.  Fainting (syncope). Symptoms of severe dehydration may include:  Changes in skin, such as: ? Cold and clammy skin. ? Blotchy (mottled) or pale skin. ? Skin that does not quickly return to normal after being lightly pinched and released (poor skin turgor).  Changes in body fluids, such as: ? Extreme thirst. ? No tear production. ? Inability to sweat when body temperature is high, such as in hot weather. ? Very little urine production.  Changes in vital signs, such  as: ? Weak pulse. ? Pulse that is more than 100 beats a minute when sitting still. ? Rapid breathing. ? Low blood pressure.  Other changes, such as: ? Sunken eyes. ? Cold hands and feet. ? Confusion. ? Lack of energy (lethargy). ? Difficulty waking up from sleep. ? Short-term weight loss. ? Unconsciousness. How is this diagnosed? This condition is diagnosed based on your symptoms and a physical exam. Blood and urine tests may be done to help confirm the diagnosis. How is this treated? Treatment for this condition depends on the severity. Mild or moderate dehydration can often be treated at home. Treatment should be started right away. Do not wait until dehydration becomes severe. Severe dehydration is an emergency and it needs to be treated in a hospital. Treatment for mild dehydration may include:  Drinking more fluids.  Replacing salts and minerals in your blood (electrolytes) that you may have lost. Treatment for moderate dehydration may include:  Drinking an oral rehydration solution (ORS). This is a drink that helps you replace fluids and electrolytes (rehydrate). It can be found at pharmacies and retail stores. Treatment for  severe dehydration may include:  Receiving fluids through an IV tube.  Receiving an electrolyte solution through a feeding tube that is passed through your nose and into your stomach (nasogastric tube, or NG tube).  Correcting any abnormalities in electrolytes.  Treating the underlying cause of dehydration. Follow these instructions at home:  If directed by your health care provider, drink an ORS: ? Make an ORS by following instructions on the package. ? Start by drinking small amounts, about  cup (120 mL) every 5-10 minutes. ? Slowly increase how much you drink until you have taken the amount recommended by your health care provider.  Drink enough clear fluid to keep your urine clear or pale yellow. If you were told to drink an ORS, finish the  ORS first, then start slowly drinking other clear fluids. Drink fluids such as: ? Water. Do not drink only water. Doing that can lead to having too little salt (sodium) in the body (hyponatremia). ? Ice chips. ? Fruit juice that you have added water to (diluted fruit juice). ? Low-calorie sports drinks.  Avoid: ? Alcohol. ? Drinks that contain a lot of sugar. These include high-calorie sports drinks, fruit juice that is not diluted, and soda. ? Caffeine. ? Foods that are greasy or contain a lot of fat or sugar.  Take over-the-counter and prescription medicines only as told by your health care provider.  Do not take sodium tablets. This can lead to having too much sodium in the body (hypernatremia).  Eat foods that contain a healthy balance of electrolytes, such as bananas, oranges, potatoes, tomatoes, and spinach.  Keep all follow-up visits as told by your health care provider. This is important. Contact a health care provider if:  You have abdominal pain that: ? Gets worse. ? Stays in one area (localizes).  You have a rash.  You have a stiff neck.  You are more irritable than usual.  You are sleepier or more difficult to wake up than usual.  You feel weak or dizzy.  You feel very thirsty.  You have urinated only a small amount of very dark urine over 6-8 hours. Get help right away if:  You have symptoms of severe dehydration.  You cannot drink fluids without vomiting.  Your symptoms get worse with treatment.  You have a fever.  You have a severe headache.  You have vomiting or diarrhea that: ? Gets worse. ? Does not go away.  You have blood or green matter (bile) in your vomit.  You have blood in your stool. This may cause stool to look black and tarry.  You have not urinated in 6-8 hours.  You faint.  Your heart rate while sitting still is over 100 beats a minute.  You have trouble breathing. This information is not intended to replace advice given  to you by your health care provider. Make sure you discuss any questions you have with your health care provider. Document Released: 08/16/2005 Document Revised: 03/12/2016 Document Reviewed: 10/10/2015 Elsevier Interactive Patient Education  [employer].

## 2017-05-23 NOTE — Telephone Encounter (Signed)
Pt called requesting [facility] and fluids today. She is still having pain and feeling poorly. She wants to check on her increased BP from last week. S/w Dr Burr Medico and called pt back. No labs needed before visit.  She will arrive at 10 am. inbasket sent to scheduler.

## 2017-05-23 NOTE — Addendum Note (Signed)
Addended by: Margaret Pyle on: 05/23/2017 08:19 AM   Modules accepted: Orders

## 2017-05-24 ENCOUNTER — Ambulatory Visit: Payer: Federal, State, Local not specified - PPO

## 2017-05-24 ENCOUNTER — Encounter: Payer: Self-pay | Admitting: Medical

## 2017-05-24 ENCOUNTER — Encounter: Payer: Federal, State, Local not specified - PPO | Admitting: Family Medicine

## 2017-05-24 DIAGNOSIS — Z0289 Encounter for other administrative examinations: Secondary | ICD-10-CM

## 2017-05-24 NOTE — Progress Notes (Signed)
[facility] Progress Note   Nancy [name] [id number] October 21, 1965 51 y.o.  Nancy [name] is managed by Dr. 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 She will return to the clinic on Thursday, 05/26/2017 for further evaluation.  Nausea and anorexia:. The patient was given 1 L of normal saline today and was given a prescription for olanzapine 2.5 mg once daily. She will return to the clinic on Thursday, 05/26/2017 for further evaluation.  Please see After Visit Summary for patient specific instructions.  Future Appointments Date Time Provider Nancy [name]  05/24/2017 3:00 PM Nancy [name], Nancy Fries R, DO [facility] None  05/26/2017 1:00 PM Nancy Mealy E., PA-C [facility] None  05/30/2017 8:00 AM [facility] LAB 2 [facility] None  05/30/2017 8:15 AM [facility] FLUSH NURSE 2 [facility] None  05/30/2017 9:00 AM Nancy Merle, MD [facility] None  05/30/2017 10:00 AM [facility] J32 DNS [facility] None  06/01/2017 11:00 AM [facility] J32 DNS [facility] None  06/13/2017 8:45 AM [facility] LAB 2 [facility] None  06/13/2017 9:00 AM [facility] FLUSH NURSE [facility] None  06/13/2017 9:30 AM Nancy Feeling, NP [facility] None  06/13/2017 10:30 AM [facility] I25 DNS [facility] None  06/15/2017 11:00 AM [facility] FLUSH NURSE 2 [facility] None    No orders of the defined types were placed in this encounter.      Subjective:   Patient ID:  Nancy [name] is a 51 y.o. (DOB [birth year])  female.  Chief Complaint: No chief complaint on file.   HPI Nancy [name] presents to the office today with a compliant of continued nausea and anorexia. The patient has been taking Ativan sublingually every 8 hours with improvement in her nausea. She reports that she does becomes sleepy after taking Ativan. She has stopped Marinol as it was of no benefit and appeared to be increasing her nausea. She states that Zofran is not helping her nausea. She reports having a temperature of 99.8 on Saturday. She is eating small bites of food but reports that she is having difficulty eating due to changes in the taste of foods. She states that most foods taste like paste. She has had recent palpitations but no chest pain. She vomited once on Saturday. She had one cup of water earlier today. According to her husband that was the most she had drank at any one time since Friday. Her abdominal pain has resolved and is rated as 0/10 today. She continues to have back pain which she rates as 3/10 today. She has taken 1 hydrocodone today. She had a bowel movement today.   Medications: I have reviewed the patient's current medications.  Allergies:  Allergies  Allergen Reactions  . Penicillins Anaphylaxis    Has patient had a PCN reaction causing immediate rash, facial/tongue/throat swelling, SOB or lightheadedness with hypotensionYes Swelling in throat  Has patient had a PCN reaction causing severe rash involving mucus membranes or skin necrosis:/No Has patient had a PCN reaction that required hospitalization/No Has patient had a PCN reaction occurring within the last 10 years:NO If all of the above answers  are "NO", then may proceed with Cephalosporin use.   . Lipitor [Atorvastatin] Palpitations    Tingling, flushing    Past Medical History:  Diagnosis Date  . Allergy    SEASONAL  . Anemia    HIGH SCHOOL  . Atopic eczema   . cholangio ca dx'd 07/12/16  . Fibrocystic breast disease   . GERD  (gastroesophageal reflux disease)   . Hx of migraines    seasonal   . Pneumonia    6 years ago   . TIA (transient ischemic attack)     Past Surgical History:  Procedure Laterality Date  . EUS N/A 07/07/2016   Procedure: UPPER ENDOSCOPIC ULTRASOUND (EUS) RADIAL;  Surgeon: [name], MD;  Location: [facility];  Service: Endoscopy;  Laterality: N/A;  . IR GENERIC HISTORICAL  08/03/2016   IR US GUIDE VASC ACCESS RIGHT 08/03/2016 Corrie Mckusick, DO [facility]  . IR GENERIC HISTORICAL  08/03/2016   IR FLUORO GUIDE PORT INSERTION RIGHT 08/03/2016 Corrie Mckusick, DO [facility]  . MYOMECTOMY  2002    Family History  Problem Relation Age of Onset  . Gout Mother   . Diabetes Mother   . Hypertension Father   . Diabetes Father   . Breast cancer Paternal Aunt   . Cancer Maternal Grandfather        throat cancer   . Cancer Cousin 74       GI cancer   . Thyroid disease Neg Hx     Social History   Social History  . Marital status: Married    Spouse name: N/A  . Number of children: 0  . Years of education: 32   Occupational History  . [employer]  .  [employer]   Social History Main Topics  . Smoking status: Never Smoker  . Smokeless tobacco: Never Used  . Alcohol use Yes     Comment: maybe 3 times a year  . Drug use: No  . Sexual activity: Yes    Partners: Male   Other Topics Concern  . Not on file   Social History Narrative  . No narrative on file    Past Medical History, Surgical history, Social history, and Family history were reviewed and updated as appropriate.   Please see review of systems for further details on the patient's review from today.   Review of Systems:  Review of Systems  Constitutional: Positive for appetite change, fatigue and unexpected weight change (10 pound weight loss in 1 week.). Negative for chills, diaphoresis and fever.  HENT: Negative for trouble swallowing.   Respiratory: Negative for cough and shortness of breath.    Cardiovascular: Positive for palpitations. Negative for chest pain and leg swelling.  Gastrointestinal: Positive for abdominal pain, nausea and vomiting. Negative for constipation and diarrhea.  Musculoskeletal: Positive for back pain.    Objective:   Physical Exam:  BP 133/88 (BP Location: Right Arm, Patient Position: Sitting)   Pulse 84   Temp 98.8 F (37.1 C) (Oral)   Resp 16   Ht 5\' 6"  (1.676 m)   Wt 123 lb 1.6 oz (55.8 kg)   SpO2 100%   BMI 19.87 kg/m  ECOG: 2  Physical Exam  Constitutional: No distress.  HENT:  Head: Normocephalic and atraumatic.  Mouth/Throat: Oropharynx is clear and moist. No oropharyngeal exudate.  Cardiovascular: Normal rate, regular rhythm and normal heart sounds.  Exam reveals no gallop and no friction rub.   No murmur heard. Pulmonary/Chest:  Effort normal and breath sounds normal. No respiratory distress. She has no wheezes. She has no rales.  Abdominal: Soft. She exhibits no distension. Bowel sounds are increased. There is no tenderness. There is no rebound and no guarding.  Musculoskeletal: She exhibits no edema.  Skin: Skin is warm and dry. She is not diaphoretic.  Increased tenting of the dorsal surfaces of the hands bilaterally    Lab Review:     Component Value Date/Time   NA 139 05/19/2017 1438   K 3.8 05/19/2017 1438   CL 100 (L) 05/17/2017 0335   CO2 21 (L) 05/19/2017 1438   GLUCOSE 70 05/19/2017 1438   BUN 16.6 05/19/2017 1438   CREATININE 0.8 05/19/2017 1438   CALCIUM 9.4 05/19/2017 1438   PROT 7.4 05/19/2017 1438   ALBUMIN 4.5 05/19/2017 1438   AST 31 05/19/2017 1438   ALT 28 05/19/2017 1438   ALKPHOS 55 05/19/2017 1438   BILITOT 0.66 05/19/2017 1438   GFRNONAA >60 05/17/2017 0335   GFRAA >60 05/17/2017 0335       Component Value Date/Time   WBC 69.4 (HH) 05/19/2017 1438   WBC 4.0 05/17/2017 0335   RBC 4.02 05/19/2017 1438   RBC 3.99 05/17/2017 0335   HGB 11.4 (L) 05/19/2017 1438   HCT 35.5 05/19/2017 1438    PLT 273 05/19/2017 1438   MCV 88.2 05/19/2017 1438   MCH 28.5 05/19/2017 1438   MCH 29.6 05/17/2017 0335   MCHC 32.3 05/19/2017 1438   MCHC 34.4 05/17/2017 0335   RDW 16.5 (H) 05/19/2017 1438   LYMPHSABS 2.3 05/19/2017 1438   MONOABS 0.7 05/19/2017 1438   EOSABS 0.0 05/19/2017 1438   BASOSABS 0.1 05/19/2017 1438   -------------------------------  Imaging from last 24 hours (if applicable):  Radiology interpretation: Dg Abd 1 View  Result Date: 05/18/2017 CLINICAL DATA:  Three-week history of left upper quadrant abdominal pain acutely worsened 2 days ago. Current history of metastatic cholangiocarcinoma. EXAM: ABDOMEN - 1 VIEW COMPARISON:  CT abdomen and pelvis 04/13/2017, 01/25/2017 and earlier. FINDINGS: Bowel gas pattern unremarkable without evidence of obstruction or significant ileus. Expected stool burden in the colon. No suggestion of free air on the supine image. No visible opaque urinary tract calculi. Regional skeleton intact. IMPRESSION: No acute abdominal abnormality. Electronically Signed   By: Evangeline Dakin M.D.   On: 05/18/2017 10:56        This patient was seen with Dr. Burr Medico with my treatment plan reviewed with her. She expressed agreement with my medical management of this patient.  I have seen the patient, examined her. I agree with the assessment and and plan and have edited the notes.   Veona is getting better slowly, still nauseated with low appetite, will try olanzapine. She will return in 2-3 days for more IVF and f/u.    Nancy [name]  05/23/2017

## 2017-05-25 LAB — CULTURE, BLOOD (SINGLE)

## 2017-05-26 ENCOUNTER — Encounter: Payer: Federal, State, Local not specified - PPO | Admitting: Medical

## 2017-05-26 ENCOUNTER — Telehealth: Payer: Self-pay | Admitting: *Deleted

## 2017-05-26 NOTE — Telephone Encounter (Signed)
Pt called and left message requesting to cancel appts for today with [place].  Stated she was feeling better, able to eat better.  Stated she would try to eat more and aimed to gain weight over the weekend. Pt also would like to know if Dr. Burr Medico would postpone her chemo to  Oct 8.   Pt needed a break. Pt's   Phone      [phone].

## 2017-05-27 ENCOUNTER — Telehealth: Payer: Self-pay | Admitting: Medical

## 2017-05-27 NOTE — Telephone Encounter (Signed)
Called pt and left message on voice mail informing pt that appts for 05/30/17 will be cancelled per pt's request and as ok per Dr. Burr Medico.   Left message that a scheduler will contact pt with new appts date and time.

## 2017-05-27 NOTE — Telephone Encounter (Signed)
Called the patient left a message that her blood culture results were negative.

## 2017-05-30 ENCOUNTER — Ambulatory Visit: Payer: Federal, State, Local not specified - PPO

## 2017-05-30 ENCOUNTER — Telehealth: Payer: Self-pay | Admitting: Hematology

## 2017-05-30 ENCOUNTER — Other Ambulatory Visit: Payer: Federal, State, Local not specified - PPO

## 2017-05-30 ENCOUNTER — Ambulatory Visit: Payer: Federal, State, Local not specified - PPO | Admitting: Hematology

## 2017-05-30 NOTE — Telephone Encounter (Signed)
Scheduled appt per 9/28 sch msg. Patient is aware of the appts.

## 2017-06-08 ENCOUNTER — Ambulatory Visit: Payer: Federal, State, Local not specified - PPO

## 2017-06-08 ENCOUNTER — Ambulatory Visit (HOSPITAL_BASED_OUTPATIENT_CLINIC_OR_DEPARTMENT_OTHER): Payer: Federal, State, Local not specified - PPO | Admitting: Nurse Practitioner

## 2017-06-08 ENCOUNTER — Telehealth: Payer: Self-pay | Admitting: Hematology

## 2017-06-08 ENCOUNTER — Other Ambulatory Visit (HOSPITAL_BASED_OUTPATIENT_CLINIC_OR_DEPARTMENT_OTHER): Payer: Federal, State, Local not specified - PPO

## 2017-06-08 VITALS — BP 109/75 | HR 90 | Temp 98.8°F | Resp 17 | Ht 66.0 in | Wt 132.2 lb

## 2017-06-08 DIAGNOSIS — R109 Unspecified abdominal pain: Secondary | ICD-10-CM | POA: Diagnosis not present

## 2017-06-08 DIAGNOSIS — R200 Anesthesia of skin: Secondary | ICD-10-CM | POA: Diagnosis not present

## 2017-06-08 DIAGNOSIS — D701 Agranulocytosis secondary to cancer chemotherapy: Secondary | ICD-10-CM | POA: Diagnosis not present

## 2017-06-08 DIAGNOSIS — C7800 Secondary malignant neoplasm of unspecified lung: Principal | ICD-10-CM

## 2017-06-08 DIAGNOSIS — C221 Intrahepatic bile duct carcinoma: Secondary | ICD-10-CM

## 2017-06-08 DIAGNOSIS — C7801 Secondary malignant neoplasm of right lung: Secondary | ICD-10-CM

## 2017-06-08 DIAGNOSIS — M545 Low back pain: Secondary | ICD-10-CM

## 2017-06-08 DIAGNOSIS — D6481 Anemia due to antineoplastic chemotherapy: Secondary | ICD-10-CM

## 2017-06-08 LAB — COMPREHENSIVE METABOLIC PANEL
ALT: 34 U/L (ref 0–55)
AST: 23 U/L (ref 5–34)
Albumin: 3.5 g/dL (ref 3.5–5.0)
Alkaline Phosphatase: 43 U/L (ref 40–150)
Anion Gap: 8 mEq/L (ref 3–11)
BUN: 10.1 mg/dL (ref 7.0–26.0)
CO2: 25 mEq/L (ref 22–29)
Calcium: 8.8 mg/dL (ref 8.4–10.4)
Chloride: 106 mEq/L (ref 98–109)
Creatinine: 0.7 mg/dL (ref 0.6–1.1)
EGFR: >60 mL/min/{1.73_m2} (ref >60)
Glucose: 98 mg/dl (ref 70–140)
Potassium: 3.9 mEq/L (ref 3.5–5.1)
Sodium: 139 mEq/L (ref 136–145)
Total Bilirubin: 0.29 mg/dL (ref 0.20–1.20)
Total Protein: 6.3 g/dL — ABNORMAL LOW (ref 6.4–8.3)

## 2017-06-08 LAB — CBC WITH DIFFERENTIAL/PLATELET
BASO%: 0.2 % (ref 0.0–2.0)
Basophils Absolute: 0 10*3/uL (ref 0.0–0.1)
EOS%: 0.5 % (ref 0.0–7.0)
Eosinophils Absolute: 0 10*3/uL (ref 0.0–0.5)
HCT: 30 % — ABNORMAL LOW (ref 34.8–46.6)
HGB: 9.7 g/dL — ABNORMAL LOW (ref 11.6–15.9)
LYMPH%: 28.2 % (ref 14.0–49.7)
MCH: 29.3 pg (ref 25.1–34.0)
MCHC: 32.3 g/dL (ref 31.5–36.0)
MCV: 90.6 fL (ref 79.5–101.0)
MONO#: 0.5 10*3/uL (ref 0.1–0.9)
MONO%: 8 % (ref 0.0–14.0)
NEUT#: 3.5 10*3/uL (ref 1.5–6.5)
NEUT%: 63.1 % (ref 38.4–76.8)
Platelets: 193 10*3/uL (ref 145–400)
RBC: 3.31 10*6/uL — ABNORMAL LOW (ref 3.70–5.45)
RDW: 15.9 % — ABNORMAL HIGH (ref 11.2–14.5)
WBC: 5.6 10*3/uL (ref 3.9–10.3)
lymph#: 1.6 10*3/uL (ref 0.9–3.3)

## 2017-06-08 MED ORDER — HEPARIN SOD (PORK) LOCK FLUSH 100 UNIT/ML IV SOLN
500.0000 [IU] | Freq: Once | INTRAVENOUS | Status: AC | PRN
  Administered 2017-06-08: 500 [IU]
  Filled 2017-06-08: qty 5

## 2017-06-08 MED ORDER — HYDROCODONE-ACETAMINOPHEN 5-325 MG PO TABS: 1.0000 | ORAL_TABLET | Freq: Four times a day (QID) | ORAL | 0 refills | Status: DC | PRN

## 2017-06-08 MED ORDER — SODIUM CHLORIDE 0.9% FLUSH
10.0000 mL | INTRAVENOUS | Status: DC | PRN
  Administered 2017-06-08: 10 mL
  Filled 2017-06-08: qty 10

## 2017-06-08 MED ORDER — DRONABINOL 5 MG PO CAPS: 5.0000 mg | ORAL_CAPSULE | Freq: Two times a day (BID) | ORAL | 1 refills | Status: DC

## 2017-06-08 NOTE — Telephone Encounter (Signed)
Scheduled appt per 10/10 los - Gave patient AVS and calender per los.  

## 2017-06-08 NOTE — Progress Notes (Addendum)
[place] OFFICE PROGRESS NOTE   Diagnosis:  Metastatic intrahepatic cholangiocarcinoma Oncology History   Metastatic cholangiocarcinoma to lung [facility])   Staging form: Perihilar Bile Ducts, AJCC 7th Edition   - Clinical stage from 07/07/2016: Stage IVB (T2b, N1, M1) - Signed by Truitt Merle, MD on 07/28/2016      Metastatic cholangiocarcinoma to lung ([place])   05/22/2014 Procedure    Routine screening colonoscopy showed a sessile polyp measuring 4 mm in the sigmoid colon, removed, otherwise negative.      06/29/2016 Imaging    CT chest, abdomen and pelvis showed 2 indeterminant nodule in left and the right lung, 4-33mm, indeterminate a hypovascular liver lesions largest in the caudate lobe measuring 2.6 cm, mild abdominal lymphadenopathy in the gastrohepatic ligament and portocaval space.      07/02/2016 Imaging    Abdominal MRI with and without contrast showed 2 lobular lesions in the caudate lobe, and is regular lymph nodes in the gastrohepatic ligament which is adjacent to the liver lesion, suspicious for malignancy. 2 small lesions in the right hepatic lobe are indeterminate, but concerning for metastasis.      07/07/2016 Procedure    EGD was negative, EUS biopsy of the liver and adjacent lymph node      07/07/2016 Initial Diagnosis    Metastatic cholangiocarcinoma to lung ([place])      07/07/2016 Initial Biopsy    Fine-needle aspiration of the liver lesion in caudate lobe and adjacent lymph nodes both showed metastatic adenocarcinoma.      08/11/2016 - 04/25/2017 Chemotherapy    Cisplatin 25 mg/m, gemcitabine 1000 mg/m, on day one and 8, every 21 days   Cisplatin and gemcitabine on Day 1, 8 every 21 days, with neulasta on day 9, Gemcitabine dosed reduced to 800 mg/m due to cytopenias, increased to 900mg /m2 on 12/06/2016. Changes to gemcitabine maintenance therapy on day 1 and 8 every 21 days, from 04/18/2017, gemcitabine was reduced  dose to 400 mg/m on 04/25/17       10/18/2016 Imaging    CT CAP  IMPRESSION: 1. Again noted are multifocal liver metastasis. Overall there has been no significant interval change in overall volume of liver metastases. 2. Similar appearance of upper abdominal adenopathy 3. Slight decrease in size of small pulmonary metastases.      01/25/2017 Imaging    CT CAP W Contrast 01/25/17 IMPRESSION: 1. Response to therapy. 2. Similar to minimal decrease in pulmonary nodules/metastasis. 3. Improvement in hepatic disease burden. 4. Decrease in abdominal adenopathy. 5. Significantly age advanced atherosclerosis, including within the coronary arteries. 6. Uterine fibroids. 7. Suspect a complex cyst in the left ovary. Recommend attention on follow-up.       04/13/2017 Imaging    CT CAP W Contrast IMPRESSION: 1. The dominant caudate lobe mass is stable in size and appearance, as is the indistinct adenopathy in the porta hepatis adjacent to the caudate lobe, and in the retroperitoneum. Several subtle additional liver lesions are slightly less conspicuous on today' s exam. The pulmonary nodules are stable, and mild omental nodularity in the left lower quadrant is very slightly worsened compared to prior. Overall the burden of malignancy appears similar to prior, and some of the findings may represent effectively treated metastatic lesions. 2. Complex left adnexal mass, 6.7 by 4.1 cm, enlarging. Pelvic sonography recommended for further characterization. 3. Other imaging findings of potential clinical significance: Aortic Atherosclerosis (ICD10-I70.0). Coronary atherosclerosis. Prominent stool throughout the colon favors constipation. Fibroid uterus.  05/16/2017 -  Chemotherapy    Second-line chemo FOLFOX every 2 weeks with neulasta after pump DC       INTERVAL HISTORY:   Ms. Hegner returns for follow-up. She completed cycle 1 FOLFOX beginning 05/16/2017.  She had significant delayed nausea requiring several days of intravenous hydration.The nausea lasted for 5 or 6 days. She found that Compazine and Zofran were not particularly effective. She did note improvement with dexamethasone and Ativan. No mouth sores though she did note thrush. She had 2 episodes of loose stools. She did not note the cold sensitivity. She has noted however persistent numbness in her toes. Appetite has improved. She has gained back some weight. Abdominal pain is better. She takes Norco typically at bedtime.  Objective:  Vital signs in last 24 hours:  Blood pressure 109/75, pulse 90, temperature 98.8 F (37.1 C), temperature source Oral, resp. rate 17, height 5\' 6"  (1.676 m), weight 132 lb 3.2 oz (60 kg), SpO2 100 %.    HEENT: No thrush or ulcers. Resp: Lungs clear bilaterally. Cardio: Regular rate and rhythm. GI: Abdomen soft. Mild tenderness at the left mid upper abdomen. No hepatomegaly. Vascular: No leg edema. Calves soft and nontender. Neuro: Vibratory sense mildly decreased over the fingertips per tuning fork exam.  Port-A-Cath without erythema.  Lab Results:  Lab Results  Component Value Date   WBC 5.6 06/08/2017   HGB 9.7 (L) 06/08/2017   HCT 30.0 (L) 06/08/2017   MCV 90.6 06/08/2017   PLT 193 06/08/2017   NEUTROABS 3.5 06/08/2017    Imaging:  No results found.  Medications: I have reviewed the patient's current medications.  Assessment/Plan: 1. Metastatic intrahepatic cholangiocarcinoma, with probable node and lung metastasis, cT2bN1M1, stage IV initially treated with cisplatin/gemcitabine. Most recently she began treatment with FOLFOX, cycle 1 05/16/2017. 2. Abdominal and back pain. She takes hydrocodone as needed. She was provided with a new prescription at today's visit. 3. Nausea, anorexia, weight loss. She continues Marinol twice a day. 4. Neutropenia and anemia related to chemotherapy. 5. Constipation. Probably worse with narcotics. She  will continue a laxative regimen. 6. Delayed nausea following cycle 1 FOLFOX.   Disposition: Ms. Daffern appears stable. She has completed 1 cycle of FOLFOX. She experienced significant delayed nausea requiring intravenous hydration. The plan is to proceed with cycle 2 FOLFOX 06/13/2017, oxaliplatin will be held. Consideration will be given to resuming oxaliplatin with cycle 3 pending her overall status at that time. Adjustments will be made to the pre and post nausea medication regimen if oxaliplatin is resumed.  She has persistent numbness in the toes. 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 Of note, she has been treated with cisplatin in the past.  She will return for cycle 2 FOLFOX 06/13/2017 without oxaliplatin. She will return for labs and a follow-up visit on 06/20/2017. She will contact the office in the interim with any problems.  Patient seen with Dr. Burr Medico.    Ned Card ANP/GNP-BC   06/08/2017  3:05 PM   I have seen the patient, examined her. I agree with the assessment and and plan and have edited the notes.   Marcheta Grammes has recovered well from last cycle chemo. Will restart chemo with 5-FU only next week on 10/15, if does well, will do FOLFOX with further dose reduction of oxaliplatin on next cycle. Nausea management discussed with her again in detail.  Truitt Merle  06/08/2017

## 2017-06-09 LAB — CANCER ANTIGEN 19-9: CA 19-9: 119 U/mL — ABNORMAL HIGH (ref 0–35)

## 2017-06-09 NOTE — Addendum Note (Signed)
Addended by: Truitt Merle on: 06/09/2017 11:54 AM   Modules accepted: Orders

## 2017-06-10 ENCOUNTER — Other Ambulatory Visit: Payer: Self-pay | Admitting: *Deleted

## 2017-06-10 DIAGNOSIS — C7800 Secondary malignant neoplasm of unspecified lung: Principal | ICD-10-CM

## 2017-06-10 DIAGNOSIS — C221 Intrahepatic bile duct carcinoma: Secondary | ICD-10-CM

## 2017-06-13 ENCOUNTER — Ambulatory Visit: Payer: Federal, State, Local not specified - PPO | Admitting: Nutrition

## 2017-06-13 ENCOUNTER — Ambulatory Visit: Payer: Federal, State, Local not specified - PPO

## 2017-06-13 ENCOUNTER — Ambulatory Visit (HOSPITAL_BASED_OUTPATIENT_CLINIC_OR_DEPARTMENT_OTHER): Payer: Federal, State, Local not specified - PPO

## 2017-06-13 ENCOUNTER — Other Ambulatory Visit (HOSPITAL_BASED_OUTPATIENT_CLINIC_OR_DEPARTMENT_OTHER): Payer: Federal, State, Local not specified - PPO

## 2017-06-13 ENCOUNTER — Ambulatory Visit: Payer: Federal, State, Local not specified - PPO | Admitting: Nurse Practitioner

## 2017-06-13 VITALS — BP 132/82 | HR 78 | Temp 98.3°F | Resp 18

## 2017-06-13 DIAGNOSIS — Z5111 Encounter for antineoplastic chemotherapy: Secondary | ICD-10-CM

## 2017-06-13 DIAGNOSIS — C221 Intrahepatic bile duct carcinoma: Secondary | ICD-10-CM

## 2017-06-13 DIAGNOSIS — C7801 Secondary malignant neoplasm of right lung: Secondary | ICD-10-CM

## 2017-06-13 DIAGNOSIS — C7800 Secondary malignant neoplasm of unspecified lung: Principal | ICD-10-CM

## 2017-06-13 LAB — CBC WITH DIFFERENTIAL/PLATELET
BASO%: 0 % (ref 0.0–2.0)
Basophils Absolute: 0 10*3/uL (ref 0.0–0.1)
EOS%: 1.5 % (ref 0.0–7.0)
Eosinophils Absolute: 0.1 10*3/uL (ref 0.0–0.5)
HCT: 32.1 % — ABNORMAL LOW (ref 34.8–46.6)
HGB: 10.2 g/dL — ABNORMAL LOW (ref 11.6–15.9)
LYMPH%: 25.6 % (ref 14.0–49.7)
MCH: 28.7 pg (ref 25.1–34.0)
MCHC: 31.8 g/dL (ref 31.5–36.0)
MCV: 90.4 fL (ref 79.5–101.0)
MONO#: 0.1 10*3/uL (ref 0.1–0.9)
MONO%: 3.5 % (ref 0.0–14.0)
NEUT#: 2.8 10*3/uL (ref 1.5–6.5)
NEUT%: 69.4 % (ref 38.4–76.8)
Platelets: 194 10*3/uL (ref 145–400)
RBC: 3.55 10*6/uL — ABNORMAL LOW (ref 3.70–5.45)
RDW: 15 % — ABNORMAL HIGH (ref 11.2–14.5)
WBC: 4 10*3/uL (ref 3.9–10.3)
lymph#: 1 10*3/uL (ref 0.9–3.3)

## 2017-06-13 LAB — COMPREHENSIVE METABOLIC PANEL
ALT: 27 U/L (ref 0–55)
AST: 20 U/L (ref 5–34)
Albumin: 3.7 g/dL (ref 3.5–5.0)
Alkaline Phosphatase: 44 U/L (ref 40–150)
Anion Gap: 7 mEq/L (ref 3–11)
BUN: 7.2 mg/dL (ref 7.0–26.0)
CO2: 26 mEq/L (ref 22–29)
Calcium: 9 mg/dL (ref 8.4–10.4)
Chloride: 108 mEq/L (ref 98–109)
Creatinine: 0.7 mg/dL (ref 0.6–1.1)
EGFR: >60 mL/min/{1.73_m2} (ref >60)
Glucose: 99 mg/dl (ref 70–140)
Potassium: 3.9 mEq/L (ref 3.5–5.1)
Sodium: 141 mEq/L (ref 136–145)
Total Bilirubin: 0.27 mg/dL (ref 0.20–1.20)
Total Protein: 6.6 g/dL (ref 6.4–8.3)

## 2017-06-13 MED ORDER — SODIUM CHLORIDE 0.9 % IJ SOLN
10.0000 mL | Freq: Once | INTRAMUSCULAR | Status: AC
  Administered 2017-06-13: 10 mL
  Filled 2017-06-13: qty 10

## 2017-06-13 MED ORDER — SODIUM CHLORIDE 0.9 % IV SOLN
Freq: Once | INTRAVENOUS | Status: AC
  Administered 2017-06-13: 10:00:00 via INTRAVENOUS

## 2017-06-13 MED ORDER — SODIUM CHLORIDE 0.9 % IV SOLN: Freq: Once | INTRAVENOUS | Status: DC

## 2017-06-13 MED ORDER — ONDANSETRON HCL 4 MG/2ML IJ SOLN: 8.0000 mg | Freq: Once | INTRAMUSCULAR | Status: DC

## 2017-06-13 MED ORDER — SODIUM CHLORIDE 0.9 % IV SOLN
2000.0000 mg/m2 | INTRAVENOUS | Status: DC
  Administered 2017-06-13: 3350 mg via INTRAVENOUS
  Filled 2017-06-13: qty 67

## 2017-06-13 MED ORDER — LEUCOVORIN CALCIUM INJECTION 350 MG
400.0000 mg/m2 | Freq: Once | INTRAVENOUS | Status: AC
  Administered 2017-06-13: 672 mg via INTRAVENOUS
  Filled 2017-06-13: qty 33.6

## 2017-06-13 MED ORDER — DEXAMETHASONE SODIUM PHOSPHATE 10 MG/ML IJ SOLN: 10.0000 mg | Freq: Once | INTRAMUSCULAR | Status: DC

## 2017-06-13 NOTE — Patient Instructions (Signed)
[facility] Discharge Instructions for Patients Receiving Chemotherapy  Today you received the following chemotherapy agents: Leucovorin and Adrucil   To help prevent nausea and vomiting after your treatment, we encourage you to take your nausea medication as directed.    If you develop nausea and vomiting that is not controlled by your nausea medication, call the clinic.   BELOW ARE SYMPTOMS THAT SHOULD BE REPORTED IMMEDIATELY:  *FEVER GREATER THAN 100.5 F  *CHILLS WITH OR WITHOUT FEVER  NAUSEA AND VOMITING THAT IS NOT CONTROLLED WITH YOUR NAUSEA MEDICATION  *UNUSUAL SHORTNESS OF BREATH  *UNUSUAL BRUISING OR BLEEDING  TENDERNESS IN MOUTH AND THROAT WITH OR WITHOUT PRESENCE OF ULCERS  *URINARY PROBLEMS  *BOWEL PROBLEMS  UNUSUAL RASH Items with * indicate a potential emergency and should be followed up as soon as possible.  Feel free to call the clinic should you have any questions or concerns. The clinic phone number is [phone].  Please show the [facility] at check-in to the Emergency Department and triage nurse.

## 2017-06-13 NOTE — Progress Notes (Signed)
Nutrition follow-up completed with patient during infusion for cholioangiocarcinoma with metastasis to the lung. Weight decreased and documented as 132.2 pounds on October 10. Patient reports her appetite comes and goes. She reports increased fatigue. She is exploring other food options to increase calories and protein. Request suggestions for protein powder.  Nutrition diagnosis: Unintended weight loss continues.  Intervention: I educated patient to continue high-calorie high-protein foods to minimize further weight loss. Recommended patient try Unjury protein powder and provided purchasing information. Provided a fact sheet on iron at patient request. Questions were answered.  Teach back method.  Monitoring, evaluation, goals:  Patient will tolerate increased calories and protein to minimize weight loss.  Next visit: To be scheduled as needed.  **Disclaimer: This note was dictated with voice recognition software. Similar sounding words can inadvertently be transcribed and this note may contain transcription errors which may not have been corrected upon publication of note.**

## 2017-06-15 ENCOUNTER — Ambulatory Visit (HOSPITAL_BASED_OUTPATIENT_CLINIC_OR_DEPARTMENT_OTHER): Payer: Federal, State, Local not specified - PPO

## 2017-06-15 VITALS — BP 101/61 | HR 84 | Temp 98.4°F | Resp 18

## 2017-06-15 DIAGNOSIS — C221 Intrahepatic bile duct carcinoma: Secondary | ICD-10-CM

## 2017-06-15 DIAGNOSIS — Z452 Encounter for adjustment and management of vascular access device: Secondary | ICD-10-CM | POA: Diagnosis not present

## 2017-06-15 DIAGNOSIS — C7801 Secondary malignant neoplasm of right lung: Secondary | ICD-10-CM | POA: Diagnosis not present

## 2017-06-15 MED ORDER — SODIUM CHLORIDE 0.9% FLUSH
10.0000 mL | INTRAVENOUS | Status: DC | PRN
  Administered 2017-06-15: 10 mL
  Filled 2017-06-15: qty 10

## 2017-06-15 MED ORDER — PEGFILGRASTIM INJECTION 6 MG/0.6ML ~~LOC~~
6.0000 mg | PREFILLED_SYRINGE | Freq: Once | SUBCUTANEOUS | Status: DC
  Filled 2017-06-15: qty 0.6

## 2017-06-15 MED ORDER — HEPARIN SOD (PORK) LOCK FLUSH 100 UNIT/ML IV SOLN
500.0000 [IU] | Freq: Once | INTRAVENOUS | Status: AC | PRN
  Administered 2017-06-15: 500 [IU]
  Filled 2017-06-15: qty 5

## 2017-06-20 ENCOUNTER — Ambulatory Visit: Payer: Federal, State, Local not specified - PPO

## 2017-06-20 ENCOUNTER — Telehealth: Payer: Self-pay | Admitting: Nurse Practitioner

## 2017-06-20 ENCOUNTER — Ambulatory Visit (HOSPITAL_BASED_OUTPATIENT_CLINIC_OR_DEPARTMENT_OTHER): Payer: Federal, State, Local not specified - PPO | Admitting: Nurse Practitioner

## 2017-06-20 ENCOUNTER — Other Ambulatory Visit (HOSPITAL_BASED_OUTPATIENT_CLINIC_OR_DEPARTMENT_OTHER): Payer: Federal, State, Local not specified - PPO

## 2017-06-20 ENCOUNTER — Encounter: Payer: Self-pay | Admitting: Nurse Practitioner

## 2017-06-20 VITALS — Wt 134.9 lb

## 2017-06-20 VITALS — BP 122/76 | HR 72 | Temp 98.6°F | Resp 18 | Ht 66.0 in | Wt 134.9 lb

## 2017-06-20 DIAGNOSIS — R110 Nausea: Secondary | ICD-10-CM

## 2017-06-20 DIAGNOSIS — C7800 Secondary malignant neoplasm of unspecified lung: Principal | ICD-10-CM

## 2017-06-20 DIAGNOSIS — Z7189 Other specified counseling: Secondary | ICD-10-CM | POA: Diagnosis not present

## 2017-06-20 DIAGNOSIS — D649 Anemia, unspecified: Secondary | ICD-10-CM

## 2017-06-20 DIAGNOSIS — G8929 Other chronic pain: Secondary | ICD-10-CM

## 2017-06-20 DIAGNOSIS — C221 Intrahepatic bile duct carcinoma: Secondary | ICD-10-CM

## 2017-06-20 DIAGNOSIS — Z23 Encounter for immunization: Secondary | ICD-10-CM

## 2017-06-20 DIAGNOSIS — K5900 Constipation, unspecified: Secondary | ICD-10-CM

## 2017-06-20 DIAGNOSIS — C7801 Secondary malignant neoplasm of right lung: Principal | ICD-10-CM

## 2017-06-20 DIAGNOSIS — M545 Low back pain: Secondary | ICD-10-CM | POA: Diagnosis not present

## 2017-06-20 DIAGNOSIS — D709 Neutropenia, unspecified: Secondary | ICD-10-CM

## 2017-06-20 DIAGNOSIS — R109 Unspecified abdominal pain: Secondary | ICD-10-CM

## 2017-06-20 LAB — CBC WITH DIFFERENTIAL/PLATELET
BASO%: 0.4 % (ref 0.0–2.0)
Basophils Absolute: 0 10*3/uL (ref 0.0–0.1)
EOS%: 0.8 % (ref 0.0–7.0)
Eosinophils Absolute: 0 10*3/uL (ref 0.0–0.5)
HCT: 30 % — ABNORMAL LOW (ref 34.8–46.6)
HGB: 9.9 g/dL — ABNORMAL LOW (ref 11.6–15.9)
LYMPH%: 51.2 % — ABNORMAL HIGH (ref 14.0–49.7)
MCH: 29 pg (ref 25.1–34.0)
MCHC: 33.1 g/dL (ref 31.5–36.0)
MCV: 87.8 fL (ref 79.5–101.0)
MONO#: 0.2 10*3/uL (ref 0.1–0.9)
MONO%: 5.8 % (ref 0.0–14.0)
NEUT#: 1.5 10*3/uL (ref 1.5–6.5)
NEUT%: 41.8 % (ref 38.4–76.8)
Platelets: 238 10*3/uL (ref 145–400)
RBC: 3.41 10*6/uL — ABNORMAL LOW (ref 3.70–5.45)
RDW: 15.1 % — ABNORMAL HIGH (ref 11.2–14.5)
WBC: 3.5 10*3/uL — ABNORMAL LOW (ref 3.9–10.3)
lymph#: 1.8 10*3/uL (ref 0.9–3.3)

## 2017-06-20 LAB — COMPREHENSIVE METABOLIC PANEL
ALT: 30 U/L (ref 0–55)
AST: 29 U/L (ref 5–34)
Albumin: 3.8 g/dL (ref 3.5–5.0)
Alkaline Phosphatase: 37 U/L — ABNORMAL LOW (ref 40–150)
Anion Gap: 10 mEq/L (ref 3–11)
BUN: 7.5 mg/dL (ref 7.0–26.0)
CO2: 26 mEq/L (ref 22–29)
Calcium: 9 mg/dL (ref 8.4–10.4)
Chloride: 103 mEq/L (ref 98–109)
Creatinine: 0.7 mg/dL (ref 0.6–1.1)
EGFR: >60 mL/min/{1.73_m2} (ref >60)
Glucose: 87 mg/dl (ref 70–140)
Potassium: 3.7 mEq/L (ref 3.5–5.1)
Sodium: 139 mEq/L (ref 136–145)
Total Bilirubin: 0.27 mg/dL (ref 0.20–1.20)
Total Protein: 6.5 g/dL (ref 6.4–8.3)

## 2017-06-20 MED ORDER — HYDROCODONE-ACETAMINOPHEN 5-325 MG PO TABS: 1.0000 | ORAL_TABLET | Freq: Four times a day (QID) | ORAL | 0 refills | Status: DC | PRN

## 2017-06-20 MED ORDER — HEPARIN SOD (PORK) LOCK FLUSH 100 UNIT/ML IV SOLN
500.0000 [IU] | Freq: Once | INTRAVENOUS | Status: AC | PRN
  Administered 2017-06-20: 500 [IU]
  Filled 2017-06-20: qty 5

## 2017-06-20 MED ORDER — INFLUENZA VAC SPLIT QUAD 0.5 ML IM SUSY
0.5000 mL | PREFILLED_SYRINGE | Freq: Once | INTRAMUSCULAR | Status: AC
  Administered 2017-06-20: 0.5 mL via INTRAMUSCULAR
  Filled 2017-06-20: qty 0.5

## 2017-06-20 MED ORDER — SODIUM CHLORIDE 0.9% FLUSH
10.0000 mL | INTRAVENOUS | Status: DC | PRN
  Administered 2017-06-20: 10 mL
  Filled 2017-06-20: qty 10

## 2017-06-20 NOTE — Telephone Encounter (Signed)
Gave avs and calendar for November  °

## 2017-06-20 NOTE — Progress Notes (Signed)
[facility]  Telephone:([phone]) [phone] Fax:([phone]) [phone]  Clinic Follow up Note   Patient Care Team: Carollee Herter, Alferd Apa, DO as PCP - General Pyrtle, Lajuan Lines, MD as Consulting Physician (Gastroenterology) 06/20/2017  SUMMARY OF ONCOLOGIC HISTORY: Oncology History   Metastatic cholangiocarcinoma to lung [facility])   Staging form: Perihilar Bile Ducts, AJCC 7th Edition   - Clinical stage from 07/07/2016: Stage IVB (T2b, N1, M1) - Signed by Truitt Merle, MD on 07/28/2016      Metastatic cholangiocarcinoma to lung ([place])   05/22/2014 Procedure    Routine screening colonoscopy showed a sessile polyp measuring 4 mm in the sigmoid colon, removed, otherwise negative.      06/29/2016 Imaging    CT chest, abdomen and pelvis showed 2 indeterminant nodule in left and the right lung, 4-18mm, indeterminate a hypovascular liver lesions largest in the caudate lobe measuring 2.6 cm, mild abdominal lymphadenopathy in the gastrohepatic ligament and portocaval space.      07/02/2016 Imaging    Abdominal MRI with and without contrast showed 2 lobular lesions in the caudate lobe, and is regular lymph nodes in the gastrohepatic ligament which is adjacent to the liver lesion, suspicious for malignancy. 2 small lesions in the right hepatic lobe are indeterminate, but concerning for metastasis.      07/07/2016 Procedure    EGD was negative, EUS biopsy of the liver and adjacent lymph node      07/07/2016 Initial Diagnosis    Metastatic cholangiocarcinoma to lung ([place])      07/07/2016 Initial Biopsy    Fine-needle aspiration of the liver lesion in caudate lobe and adjacent lymph nodes both showed metastatic adenocarcinoma.      08/11/2016 - 04/25/2017 Chemotherapy    Cisplatin 25 mg/m, gemcitabine 1000 mg/m, on day one and 8, every 21 days   Cisplatin and gemcitabine on Day 1, 8 every 21 days, with neulasta on day 9, Gemcitabine dosed reduced to 800 mg/m due to cytopenias, increased to 900mg /m2  on 12/06/2016. Changes to gemcitabine maintenance therapy on day 1 and 8 every 21 days, from 04/18/2017, gemcitabine was reduced dose to 400 mg/m on 04/25/17       10/18/2016 Imaging    CT CAP  IMPRESSION: 1. Again noted are multifocal liver metastasis. Overall there has been no significant interval change in overall volume of liver metastases. 2. Similar appearance of upper abdominal adenopathy 3. Slight decrease in size of small pulmonary metastases.      01/25/2017 Imaging    CT CAP W Contrast 01/25/17 IMPRESSION: 1. Response to therapy. 2. Similar to minimal decrease in pulmonary nodules/metastasis. 3. Improvement in hepatic disease burden. 4. Decrease in abdominal adenopathy. 5. Significantly age advanced atherosclerosis, including within the coronary arteries. 6. Uterine fibroids. 7. Suspect a complex cyst in the left ovary. Recommend attention on follow-up.       04/13/2017 Imaging    CT CAP W Contrast IMPRESSION: 1. The dominant caudate lobe mass is stable in size and appearance, as is the indistinct adenopathy in the porta hepatis adjacent to the caudate lobe, and in the retroperitoneum. Several subtle additional liver lesions are slightly less conspicuous on today' s exam. The pulmonary nodules are stable, and mild omental nodularity in the left lower quadrant is very slightly worsened compared to prior. Overall the burden of malignancy appears similar to prior, and some of the findings may represent effectively treated metastatic lesions. 2. Complex left adnexal mass, 6.7 by 4.1 cm, enlarging. Pelvic sonography recommended for  further characterization. 3. Other imaging findings of potential clinical significance: Aortic Atherosclerosis (ICD10-I70.0). Coronary atherosclerosis. Prominent stool throughout the colon favors constipation. Fibroid uterus.      05/16/2017 -  Chemotherapy    Second-line chemo FOLFOX every 2 weeks with neulasta after pump DC     CURRENT  THERAPY:  Changed chemo to Second-line chemo FOLFOX every 2 weeks with neulasta starting 05/16/17  INTERVAL HISTORY: Ms. Mauney returns today one week after cycle 2 FOLFOX, she received 5-FU only on 06/13/17 due to significant delayed nausea requiring IV hydration. She feels well today with good appetite. She experienced nausea for 2 days, managed with zofran PRN. She has mild constipation, managed with sennakot, milk of magnesia, fruits, and vegetable juices. She has chronic back and abdominal pain which she manages with 1-2 norco Q4-6 hours and a heating pad. She is concerned about prominent blood vessels in her right eye.  REVIEW OF SYSTEMS:   Constitutional: Denies fatigue, fevers, chills or abnormal weight loss (+) weight increased 2 lbs from last visit  Eyes: Denies blurriness of vision, eye pain, itching, discharge, or vision changes Ears, nose, mouth, throat, and face: Denies mucositis, sore throat, or thrush Respiratory: Denies cough, dyspnea or wheezes Cardiovascular: Denies palpitation, chest discomfort or lower extremity swelling Gastrointestinal:  Denies vomiting, diarrhea, heartburn or change in bowel habits (+) intermittent nausea, controlled with zofran PRN (+) constipation managed with sennakot, milk of magnesia, fruits, vegetable juices Skin: Denies abnormal skin rashes Lymphatics: Denies new lymphadenopathy (+) easy bruising Neurological:Denies new numbness, tingling weaknesses Behavioral/Psych: Mood is stable, no new changes  All other systems were reviewed with the patient and are negative.  MEDICAL HISTORY:  Past Medical History:  Diagnosis Date  . Allergy    SEASONAL  . Anemia    [employer]  . Atopic eczema   . cholangio ca dx'd 07/12/16  . Fibrocystic breast disease   . GERD (gastroesophageal reflux disease)   . Hx of migraines    seasonal   . Pneumonia    6 years ago   . TIA (transient ischemic attack)     SURGICAL HISTORY: Past Surgical History:    Procedure Laterality Date  . EUS N/A 07/07/2016   Procedure: UPPER ENDOSCOPIC ULTRASOUND (EUS) RADIAL;  Surgeon: [name], MD;  Location: [facility] ENDOSCOPY;  Service: Endoscopy;  Laterality: N/A;  . IR GENERIC HISTORICAL  08/03/2016   IR US GUIDE VASC ACCESS RIGHT 08/03/2016 Corrie Mckusick, DO [facility]-INTERV RAD  . IR GENERIC HISTORICAL  08/03/2016   IR FLUORO GUIDE PORT INSERTION RIGHT 08/03/2016 Corrie Mckusick, DO [facility]-INTERV RAD  . MYOMECTOMY  2002    I have reviewed the social history and family history with the patient and they are unchanged from previous note.  ALLERGIES:  is allergic to penicillins and lipitor [atorvastatin].  MEDICATIONS:  Current Outpatient Prescriptions  Medication Sig Dispense Refill  . aspirin 81 MG tablet Take 81 mg by mouth daily.    . cetirizine (ZYRTEC) 10 MG tablet Take 10 mg by mouth daily as needed. For allergies     . Cholecalciferol (VITAMIN D3) 5000 UNITS CAPS Take 1 capsule by mouth daily.    . COLOSTRUM PO Take 1.25 g by mouth 2 (two) times daily. 1.25 grams twice daily    . dicyclomine (BENTYL) 20 MG tablet 3 (three) times daily as needed.     . dronabinol (MARINOL) 5 MG capsule Take 1 capsule (5 mg total) by mouth 2 (two) times daily before a meal.  60 capsule 1  . HYDROcodone-acetaminophen (NORCO) 5-325 MG tablet Take 1-2 tablets by mouth every 6 (six) hours as needed for severe pain. 60 tablet 0  . lidocaine-prilocaine (EMLA) cream Apply 1 application topically as needed. Apply to portacath 1 1/2 hours - 2 hours prior to procedures as needed. 30 g 1  . magnesium hydroxide (MILK OF MAGNESIA) 400 MG/5ML suspension Take 30 mLs by mouth daily as needed for mild constipation.    . meclizine (ANTIVERT) 50 MG tablet Take 1 tablet (50 mg total) by mouth 3 (three) times daily as needed. 30 tablet 0  . Menaquinone-7 (VITAMIN K2) 40 MCG TABS Take by mouth.    . mometasone (ELOCON) 0.1 % lotion Apply topically daily. 60 mL 0  . omeprazole (PRILOSEC) 40 MG capsule  Take 1 capsule (40 mg total) by mouth 2 (two) times daily before a meal. 60 capsule 11  . Prenatal Vit-Fe Fumarate-FA (PRENATAL MULTIVITAMIN) TABS tablet Take 2 tablets by mouth daily at 12 noon.    . triamcinolone cream (KENALOG) 0.1 % Apply topically.    Loura Pardon Salicylate (ASPERCREME EX) Apply 1 application topically.    . ALPRAZolam (XANAX) 0.5 MG tablet Take 1 tablet (0.5 mg total) by mouth as needed for anxiety. 10 tablet 0  . LORazepam (ATIVAN) 0.5 MG tablet Place under tongue and dissolve every 8 hours as needed for nausea. Do not take Xanax while taking this medication. 30 tablet 0  . OLANZapine (ZYPREXA) 2.5 MG tablet Take 1 tablet (2.5 mg total) by mouth at bedtime. 30 tablet 1  . ondansetron (ZOFRAN) 8 MG tablet Take 1 tablet (8 mg total) by mouth every 8 (eight) hours as needed for nausea or vomiting. 20 tablet 1  . oxyCODONE (OXYCONTIN) 10 mg 12 hr tablet Take 1 tablet (10 mg total) by mouth every 12 (twelve) hours. 30 tablet 0  . promethazine (PHENERGAN) 25 MG tablet Take 1 tablet (25 mg total) by mouth every 6 (six) hours as needed for nausea or vomiting. 40 tablet 0  . VITAMIN K PO Take 1 capsule by mouth daily.     No current facility-administered medications for this visit.    Facility-Administered Medications Ordered in Other Visits  Medication Dose Route Frequency Provider Last Rate Last Dose  . alteplase (CATHFLO ACTIVASE) injection 2 mg  2 mg Intracatheter Once PRN Truitt Merle, MD      . heparin lock flush 100 unit/mL  250 Units Intracatheter Once PRN Truitt Merle, MD      . ondansetron [facility]) injection 8 mg  8 mg Intravenous Once Truitt Merle, MD      . sodium chloride flush (NS) 0.9 % injection 10 mL  10 mL Intracatheter PRN Truitt Merle, MD   10 mL at 09/09/16 1645  . sodium chloride flush (NS) 0.9 % injection 10 mL  10 mL Intravenous PRN Truitt Merle, MD   10 mL at 02/11/17 1515  . sodium chloride flush (NS) 0.9 % injection 10 mL  10 mL Intracatheter PRN Truitt Merle, MD   10 mL  at 05/18/17 1510    PHYSICAL EXAMINATION: ECOG PERFORMANCE STATUS: 1 - Symptomatic but completely ambulatory  Vitals:   06/20/17 1454  BP: 122/76  Pulse: 72  Resp: 18  Temp: 98.6 F (37 C)  SpO2: 100%   Filed Weights   06/20/17 1454  Weight: 134 lb 14.4 oz (61.2 kg)    GENERAL:alert, no distress and comfortable SKIN: skin color, texture, turgor are normal, no rashes  or significant lesions EYES: normal, Conjunctiva are pink and non-injected, sclera clear, no hemorrhage  OROPHARYNX: no exudate, no erythema and lips, buccal mucosa, and tongue normal  NECK: supple, thyroid normal size, non-tender, without nodularity LYMPH:  no palpable cervical or supraclavicular lymphadenopathy LUNGS: clear to auscultation bilaterally with normal breathing effort HEART: regular rate & rhythm, no murmurs and no lower extremity edema ABDOMEN:abdomen soft, flat, non-tender and normal bowel sounds. No palpable hepatomegaly or masses  Musculoskeletal:no cyanosis of digits and no clubbing  NEURO: alert & oriented x 3 with fluent speech, no focal motor deficits. Mildly decreased vibratory sense to fingertips per tuning fork exam PAC without erythema.   LABORATORY DATA:  I have reviewed the data as listed CBC Latest Ref Rng & Units 06/20/2017 06/13/2017 06/08/2017  WBC 3.9 - 10.3 10e3/uL 3.5(L) 4.0 5.6  Hemoglobin 11.6 - 15.9 g/dL 9.9(L) 10.2(L) 9.7(L)  Hematocrit 34.8 - 46.6 % 30.0(L) 32.1(L) 30.0(L)  Platelets 145 - 400 10e3/uL 238 194 193     CMP Latest Ref Rng & Units 06/20/2017 06/13/2017 06/08/2017  Glucose 70 - 140 mg/dl 87 99 98  BUN 7.0 - 26.0 mg/dL 7.5 7.2 10.1  Creatinine 0.6 - 1.1 mg/dL 0.7 0.7 0.7  Sodium 136 - 145 mEq/L 139 141 139  Potassium 3.5 - 5.1 mEq/L 3.7 3.9 3.9  Chloride 101 - 111 mmol/L - - -  CO2 22 - 29 mEq/L 26 26 25   Calcium 8.4 - 10.4 mg/dL 9.0 9.0 8.8  Total Protein 6.4 - 8.3 g/dL 6.5 6.6 6.3(L)  Total Bilirubin 0.20 - 1.20 mg/dL 0.27 0.27 0.29  Alkaline Phos  40 - 150 U/L 37(L) 44 43  AST 5 - 34 U/L 29 20 23   ALT 0 - 55 U/L 30 27 34    RADIOGRAPHIC STUDIES: I have personally reviewed the radiological images as listed and agreed with the findings in the report. No results found.   ASSESSMENT & PLAN: 51 y.o. African-American female, without significant past medical history, presented with abdominal pain  1. Metastatic intrahepatic cholangiocarcinoma, with probable node and lung metastasis, cT2bN1M1, stage IV 2. Abdominal pain, back pain, worsening  3. Nausea and anorexia and weight loss  4. Neutropenia and anemia 5. Constipation 6. Goals of care discussion 7. Oral thrush   Ms. Lewey is stable today, appears to have tolerated cycle 2 FOLFOX with 5FU only well overall. Mild nausea and constipation are well managed on current regimen. Abdominal and back pain appear to be controlled well on norco 1-2 tablets q4-6 hours. I refilled this for her today.  VS and weight are stable, physical exam is unremarkable. Thrush resolved. CBC and Cmet are adequate for treatment. She will return in 1 week for f/u and FOLFOX, with plan to add back oxaliplatin at reduced dose.   PLAN -flu vaccine today -continue zofran PRN for nausea, sennakot and milk of magnesia for constipation, and norco PRN for pain -refilled norco  -return in 1 week for f/u and cycle 3 FOLFOX, oxaliplatin added back at reduced dose   All questions were answered. The patient knows to call the clinic with any problems, questions or concerns. No barriers to learning was detected.     Alla Feeling, NP 06/20/17

## 2017-06-21 LAB — CANCER ANTIGEN 19-9: CA 19-9: 107 U/mL — ABNORMAL HIGH (ref 0–35)

## 2017-06-27 ENCOUNTER — Encounter: Payer: Self-pay | Admitting: Nurse Practitioner

## 2017-06-27 ENCOUNTER — Other Ambulatory Visit (HOSPITAL_BASED_OUTPATIENT_CLINIC_OR_DEPARTMENT_OTHER): Payer: Federal, State, Local not specified - PPO

## 2017-06-27 ENCOUNTER — Ambulatory Visit (HOSPITAL_BASED_OUTPATIENT_CLINIC_OR_DEPARTMENT_OTHER): Payer: Federal, State, Local not specified - PPO | Admitting: Nurse Practitioner

## 2017-06-27 ENCOUNTER — Ambulatory Visit: Payer: Federal, State, Local not specified - PPO

## 2017-06-27 ENCOUNTER — Ambulatory Visit (HOSPITAL_BASED_OUTPATIENT_CLINIC_OR_DEPARTMENT_OTHER): Payer: Federal, State, Local not specified - PPO

## 2017-06-27 VITALS — BP 128/82 | HR 93 | Temp 98.7°F | Resp 17 | Ht 66.0 in | Wt 130.8 lb

## 2017-06-27 DIAGNOSIS — C221 Intrahepatic bile duct carcinoma: Secondary | ICD-10-CM

## 2017-06-27 DIAGNOSIS — C7801 Secondary malignant neoplasm of right lung: Principal | ICD-10-CM

## 2017-06-27 DIAGNOSIS — R630 Anorexia: Secondary | ICD-10-CM | POA: Diagnosis not present

## 2017-06-27 DIAGNOSIS — D649 Anemia, unspecified: Secondary | ICD-10-CM | POA: Diagnosis not present

## 2017-06-27 DIAGNOSIS — R634 Abnormal weight loss: Secondary | ICD-10-CM | POA: Diagnosis not present

## 2017-06-27 DIAGNOSIS — R1011 Right upper quadrant pain: Secondary | ICD-10-CM

## 2017-06-27 DIAGNOSIS — M545 Low back pain: Secondary | ICD-10-CM

## 2017-06-27 DIAGNOSIS — D709 Neutropenia, unspecified: Secondary | ICD-10-CM | POA: Diagnosis not present

## 2017-06-27 DIAGNOSIS — G620 Drug-induced polyneuropathy: Secondary | ICD-10-CM | POA: Diagnosis not present

## 2017-06-27 DIAGNOSIS — C7800 Secondary malignant neoplasm of unspecified lung: Principal | ICD-10-CM

## 2017-06-27 DIAGNOSIS — Z5111 Encounter for antineoplastic chemotherapy: Secondary | ICD-10-CM

## 2017-06-27 LAB — COMPREHENSIVE METABOLIC PANEL
ALT: 24 U/L (ref 0–55)
AST: 23 U/L (ref 5–34)
Albumin: 4 g/dL (ref 3.5–5.0)
Alkaline Phosphatase: 37 U/L — ABNORMAL LOW (ref 40–150)
Anion Gap: 9 mEq/L (ref 3–11)
BUN: 8.7 mg/dL (ref 7.0–26.0)
CO2: 26 mEq/L (ref 22–29)
Calcium: 9.2 mg/dL (ref 8.4–10.4)
Chloride: 103 mEq/L (ref 98–109)
Creatinine: 0.7 mg/dL (ref 0.6–1.1)
EGFR: >60 mL/min/{1.73_m2} (ref >60)
Glucose: 103 mg/dl (ref 70–140)
Potassium: 3.9 mEq/L (ref 3.5–5.1)
Sodium: 138 mEq/L (ref 136–145)
Total Bilirubin: 0.51 mg/dL (ref 0.20–1.20)
Total Protein: 6.7 g/dL (ref 6.4–8.3)

## 2017-06-27 LAB — CBC WITH DIFFERENTIAL/PLATELET
BASO%: 0.4 % (ref 0.0–2.0)
Basophils Absolute: 0 10*3/uL (ref 0.0–0.1)
EOS%: 0.8 % (ref 0.0–7.0)
Eosinophils Absolute: 0 10*3/uL (ref 0.0–0.5)
HCT: 33.2 % — ABNORMAL LOW (ref 34.8–46.6)
HGB: 10.8 g/dL — ABNORMAL LOW (ref 11.6–15.9)
LYMPH%: 36.2 % (ref 14.0–49.7)
MCH: 28.5 pg (ref 25.1–34.0)
MCHC: 32.6 g/dL (ref 31.5–36.0)
MCV: 87.5 fL (ref 79.5–101.0)
MONO#: 0.2 10*3/uL (ref 0.1–0.9)
MONO%: 6.2 % (ref 0.0–14.0)
NEUT#: 1.5 10*3/uL (ref 1.5–6.5)
NEUT%: 56.4 % (ref 38.4–76.8)
Platelets: 224 10*3/uL (ref 145–400)
RBC: 3.8 10*6/uL (ref 3.70–5.45)
RDW: 15.6 % — ABNORMAL HIGH (ref 11.2–14.5)
WBC: 2.7 10*3/uL — ABNORMAL LOW (ref 3.9–10.3)
lymph#: 1 10*3/uL (ref 0.9–3.3)

## 2017-06-27 MED ORDER — ONDANSETRON HCL 8 MG PO TABS: 8.0000 mg | ORAL_TABLET | Freq: Three times a day (TID) | ORAL | 3 refills | Status: DC | PRN

## 2017-06-27 MED ORDER — PALONOSETRON HCL INJECTION 0.25 MG/5ML
INTRAVENOUS | Status: AC
  Filled 2017-06-27: qty 5

## 2017-06-27 MED ORDER — MORPHINE SULFATE (PF) 4 MG/ML IV SOLN
INTRAVENOUS | Status: AC
  Filled 2017-06-27: qty 1

## 2017-06-27 MED ORDER — DEXAMETHASONE SODIUM PHOSPHATE 10 MG/ML IJ SOLN
10.0000 mg | Freq: Once | INTRAMUSCULAR | Status: AC
  Administered 2017-06-27: 10 mg via INTRAVENOUS

## 2017-06-27 MED ORDER — PALONOSETRON HCL INJECTION 0.25 MG/5ML
0.2500 mg | Freq: Once | INTRAVENOUS | Status: AC
  Administered 2017-06-27: 0.25 mg via INTRAVENOUS

## 2017-06-27 MED ORDER — LEUCOVORIN CALCIUM INJECTION 350 MG
400.0000 mg/m2 | Freq: Once | INTRAVENOUS | Status: AC
  Administered 2017-06-27: 672 mg via INTRAVENOUS
  Filled 2017-06-27: qty 33.6

## 2017-06-27 MED ORDER — OXYCODONE HCL 5 MG PO TABS
5.0000 mg | ORAL_TABLET | ORAL | 0 refills | Status: DC | PRN
Start: 2017-06-27 — End: 2017-07-18

## 2017-06-27 MED ORDER — SODIUM CHLORIDE 0.9 % IV SOLN
2000.0000 mg/m2 | INTRAVENOUS | Status: DC
  Administered 2017-06-27: 3350 mg via INTRAVENOUS
  Filled 2017-06-27: qty 67

## 2017-06-27 MED ORDER — SODIUM CHLORIDE 0.9% FLUSH
10.0000 mL | INTRAVENOUS | Status: DC | PRN
  Administered 2017-06-27: 10 mL
  Filled 2017-06-27: qty 10

## 2017-06-27 MED ORDER — DEXAMETHASONE SODIUM PHOSPHATE 10 MG/ML IJ SOLN
INTRAMUSCULAR | Status: AC
  Filled 2017-06-27: qty 1

## 2017-06-27 MED ORDER — MORPHINE SULFATE 4 MG/ML IJ SOLN
1.0000 mg | Freq: Once | INTRAMUSCULAR | Status: AC
  Administered 2017-06-27: 1 mg via INTRAVENOUS
  Filled 2017-06-27: qty 1

## 2017-06-27 MED ORDER — MORPHINE SULFATE 4 MG/ML IJ SOLN
2.0000 mg | Freq: Once | INTRAMUSCULAR | Status: AC
  Administered 2017-06-27: 2 mg via INTRAVENOUS
  Filled 2017-06-27: qty 1

## 2017-06-27 NOTE — Progress Notes (Addendum)
[place]  [phone] [phone]  Clinic Follow up Note   Patient Care Team: Carollee Herter, Alferd Apa, DO as PCP - General Pyrtle, Lajuan Lines, MD as Consulting Physician (Gastroenterology) 06/27/2017  SUMMARY OF ONCOLOGIC HISTORY: Oncology History   Metastatic cholangiocarcinoma to lung [facility])   Staging form: Perihilar Bile Ducts, AJCC 7th Edition   - Clinical stage from 07/07/2016: Stage IVB (T2b, N1, M1) - Signed by Truitt Merle, MD on 07/28/2016      Metastatic cholangiocarcinoma to lung ([place])   05/22/2014 Procedure    Routine screening colonoscopy showed a sessile polyp measuring 4 mm in the sigmoid colon, removed, otherwise negative.      06/29/2016 Imaging    CT chest, abdomen and pelvis showed 2 indeterminant nodule in left and the right lung, 4-20mm, indeterminate a hypovascular liver lesions largest in the caudate lobe measuring 2.6 cm, mild abdominal lymphadenopathy in the gastrohepatic ligament and portocaval space.      07/02/2016 Imaging    Abdominal MRI with and without contrast showed 2 lobular lesions in the caudate lobe, and is regular lymph nodes in the gastrohepatic ligament which is adjacent to the liver lesion, suspicious for malignancy. 2 small lesions in the right hepatic lobe are indeterminate, but concerning for metastasis.      07/07/2016 Procedure    EGD was negative, EUS biopsy of the liver and adjacent lymph node      07/07/2016 Initial Diagnosis    Metastatic cholangiocarcinoma to lung ([place])      07/07/2016 Initial Biopsy    Fine-needle aspiration of the liver lesion in caudate lobe and adjacent lymph nodes both showed metastatic adenocarcinoma.      08/11/2016 - 04/25/2017 Chemotherapy    Cisplatin 25 mg/m, gemcitabine 1000 mg/m, on day one and 8, every 21 days   Cisplatin and gemcitabine on Day 1, 8 every 21 days, with neulasta on day 9, Gemcitabine dosed reduced to 800 mg/m due to cytopenias, increased to 900mg /m2  on 12/06/2016. Changes to gemcitabine maintenance therapy on day 1 and 8 every 21 days, from 04/18/2017, gemcitabine was reduced dose to 400 mg/m on 04/25/17       10/18/2016 Imaging    CT CAP  IMPRESSION: 1. Again noted are multifocal liver metastasis. Overall there has been no significant interval change in overall volume of liver metastases. 2. Similar appearance of upper abdominal adenopathy 3. Slight decrease in size of small pulmonary metastases.      01/25/2017 Imaging    CT CAP W Contrast 01/25/17 IMPRESSION: 1. Response to therapy. 2. Similar to minimal decrease in pulmonary nodules/metastasis. 3. Improvement in hepatic disease burden. 4. Decrease in abdominal adenopathy. 5. Significantly age advanced atherosclerosis, including within the coronary arteries. 6. Uterine fibroids. 7. Suspect a complex cyst in the left ovary. Recommend attention on follow-up.       04/13/2017 Imaging    CT CAP W Contrast IMPRESSION: 1. The dominant caudate lobe mass is stable in size and appearance, as is the indistinct adenopathy in the porta hepatis adjacent to the caudate lobe, and in the retroperitoneum. Several subtle additional liver lesions are slightly less conspicuous on today' s exam. The pulmonary nodules are stable, and mild omental nodularity in the left lower quadrant is very slightly worsened compared to prior. Overall the burden of malignancy appears similar to prior, and some of the findings may represent effectively treated metastatic lesions. 2. Complex left adnexal mass, 6.7 by 4.1 cm, enlarging. Pelvic sonography recommended for  further characterization. 3. Other imaging findings of potential clinical significance: Aortic Atherosclerosis (ICD10-I70.0). Coronary atherosclerosis. Prominent stool throughout the colon favors constipation. Fibroid uterus.      05/16/2017 -  Chemotherapy    Second-line chemo FOLFOX every 2 weeks with neulasta after pump DC     CURRENT  THERAPY: Changed chemo to Second-line chemo FOLFOX every 2 weeks with neulasta starting 05/16/17; Oxali held with cycle 2 and 3 due to delayed nausea, vomiting, weight loss, and abdominal pain  INTERVAL HISTORY: Ms. Hakimian returns for follow up as scheduled prior to cycle 3 FOLFOX. She received cycle 2 with oxaliplatin omitted on 06/13/17. She tolerated this well with minimal nausea; she gained weight back after cycle 1. However today she presents with increased abdominal pain that causes nausea when not managed well. She has lost weight since last week because she is not able to eat when she has abdominal pain. Her middle to right upper abd pain is 5/10 prior to norco and improves to 3/10 but does not completely go away. She requires 2 norco q5-6 hours. Today she is writhing in her chair, visibly uncomfortable, last norco 5 hours ago. Nausea without emesis is managed with zofran PRN, she has begun taking zofran with norco for better relief. Persistent numbness/tingling to toes is stable today, not impairing gait or function. Denies fatigue, fever, chills, cough, chest pain, dyspnea, constipation, or diarrhea.   REVIEW OF SYSTEMS:   Constitutional: Denies fatigue, fevers, chills (+) 4 lbs weight loss in 1 week  Eyes: Denies blurriness of vision Ears, nose, mouth, throat, and face: Denies mucositis or sore throat Respiratory: Denies cough, dyspnea or wheezes Cardiovascular: Denies palpitation, chest discomfort or lower extremity swelling Gastrointestinal:  Denies emesis, heartburn or change in bowel habits (+) middle- to right side upper abd pain 5/10 (+) nausea that increases when pain is not well managed, zofran PRN provides relief (+) constipation managed with sennakot, Milk of magnesia Skin: Denies abnormal skin rashes Lymphatics: Denies new lymphadenopathy or easy bruising Neurological:Denies new weaknesses (+) tingling to toes, stable, does not impair function  Behavioral/Psych: Mood is  stable, no new changes  MSK: denies back pain All other systems were reviewed with the patient and are negative.  MEDICAL HISTORY:  Past Medical History:  Diagnosis Date  . Allergy    SEASONAL  . Anemia    [employer]  . Atopic eczema   . cholangio ca dx'd 07/12/16  . Fibrocystic breast disease   . GERD (gastroesophageal reflux disease)   . Hx of migraines    seasonal   . Pneumonia    6 years ago   . TIA (transient ischemic attack)     SURGICAL HISTORY: Past Surgical History:  Procedure Laterality Date  . EUS N/A 07/07/2016   Procedure: UPPER ENDOSCOPIC ULTRASOUND (EUS) RADIAL;  Surgeon: [name], MD;  Location: [facility] ENDOSCOPY;  Service: Endoscopy;  Laterality: N/A;  . IR GENERIC HISTORICAL  08/03/2016   IR US GUIDE VASC ACCESS RIGHT 08/03/2016 Corrie Mckusick, DO [facility]-INTERV RAD  . IR GENERIC HISTORICAL  08/03/2016   IR FLUORO GUIDE PORT INSERTION RIGHT 08/03/2016 Corrie Mckusick, DO [facility]  . MYOMECTOMY  2002    I have reviewed the social history and family history with the patient and they are unchanged from previous note.  ALLERGIES:  is allergic to penicillins and lipitor [atorvastatin].  MEDICATIONS:  Current Outpatient Prescriptions  Medication Sig Dispense Refill  . dronabinol (MARINOL) 5 MG capsule Take 1 capsule (5  mg total) by mouth 2 (two) times daily before a meal. 60 capsule 1  . HYDROcodone-acetaminophen (NORCO) 5-325 MG tablet Take 1-2 tablets by mouth every 6 (six) hours as needed for severe pain. 60 tablet 0  . lidocaine-prilocaine (EMLA) cream Apply 1 application topically as needed. Apply to portacath 1 1/2 hours - 2 hours prior to procedures as needed. 30 g 1  . LORazepam (ATIVAN) 0.5 MG tablet Place under tongue and dissolve every 8 hours as needed for nausea. Do not take Xanax while taking this medication. 30 tablet 0  . magnesium hydroxide (MILK OF MAGNESIA) 400 MG/5ML suspension Take 30 mLs by mouth daily as needed for mild constipation.    .  ondansetron (ZOFRAN) 8 MG tablet Take 1 tablet (8 mg total) by mouth every 8 (eight) hours as needed for nausea or vomiting. 30 tablet 3  . ALPRAZolam (XANAX) 0.5 MG tablet Take 1 tablet (0.5 mg total) by mouth as needed for anxiety. 10 tablet 0  . aspirin 81 MG tablet Take 81 mg by mouth daily.    . cetirizine (ZYRTEC) 10 MG tablet Take 10 mg by mouth daily as needed. For allergies     . Cholecalciferol (VITAMIN D3) 5000 UNITS CAPS Take 1 capsule by mouth daily.    . COLOSTRUM PO Take 1.25 g by mouth 2 (two) times daily. 1.25 grams twice daily    . dicyclomine (BENTYL) 20 MG tablet 3 (three) times daily as needed.     . meclizine (ANTIVERT) 50 MG tablet Take 1 tablet (50 mg total) by mouth 3 (three) times daily as needed. 30 tablet 0  . Menaquinone-7 (VITAMIN K2) 40 MCG TABS Take by mouth.    . mometasone (ELOCON) 0.1 % lotion Apply topically daily. 60 mL 0  . OLANZapine (ZYPREXA) 2.5 MG tablet Take 1 tablet (2.5 mg total) by mouth at bedtime. 30 tablet 1  . omeprazole (PRILOSEC) 40 MG capsule Take 1 capsule (40 mg total) by mouth 2 (two) times daily before a meal. 60 capsule 11  . oxyCODONE (OXY IR/ROXICODONE) 5 MG immediate release tablet Take 1 tablet (5 mg total) by mouth every 4 (four) hours as needed for severe pain. 40 tablet 0  . oxyCODONE (OXYCONTIN) 10 mg 12 hr tablet Take 1 tablet (10 mg total) by mouth every 12 (twelve) hours. 30 tablet 0  . Prenatal Vit-Fe Fumarate-FA (PRENATAL MULTIVITAMIN) TABS tablet Take 2 tablets by mouth daily at 12 noon.    . promethazine (PHENERGAN) 25 MG tablet Take 1 tablet (25 mg total) by mouth every 6 (six) hours as needed for nausea or vomiting. 40 tablet 0  . triamcinolone cream (KENALOG) 0.1 % Apply topically.    Loura Pardon Salicylate (ASPERCREME EX) Apply 1 application topically.    Marland Kitchen VITAMIN K PO Take 1 capsule by mouth daily.     No current facility-administered medications for this visit.    Facility-Administered Medications Ordered in Other  Visits  Medication Dose Route Frequency Provider Last Rate Last Dose  . alteplase (CATHFLO ACTIVASE) injection 2 mg  2 mg Intracatheter Once PRN Truitt Merle, MD      . dexamethasone (DECADRON) injection 10 mg  10 mg Intravenous Once Truitt Merle, MD      . fluorouracil (ADRUCIL) 3,350 mg in sodium chloride 0.9 % 83 mL chemo infusion  2,000 mg/m2 (Treatment Plan Recorded) Intravenous 1 day or 1 dose Truitt Merle, MD      . heparin lock flush 100 unit/mL  250  Units Intracatheter Once PRN Truitt Merle, MD      . leucovorin 672 mg in dextrose 5 % 250 mL infusion  400 mg/m2 (Treatment Plan Recorded) Intravenous Once Truitt Merle, MD      . ondansetron [facility]) injection 8 mg  8 mg Intravenous Once Truitt Merle, MD      . palonosetron (ALOXI) injection 0.25 mg  0.25 mg Intravenous Once Truitt Merle, MD      . sodium chloride flush (NS) 0.9 % injection 10 mL  10 mL Intracatheter PRN Truitt Merle, MD   10 mL at 09/09/16 1645  . sodium chloride flush (NS) 0.9 % injection 10 mL  10 mL Intravenous PRN Truitt Merle, MD   10 mL at 02/11/17 1515  . sodium chloride flush (NS) 0.9 % injection 10 mL  10 mL Intracatheter PRN Truitt Merle, MD   10 mL at 05/18/17 1510    PHYSICAL EXAMINATION: ECOG PERFORMANCE STATUS: 2 - Symptomatic, <50% confined to bed  Vitals:   06/27/17 1015  BP: 128/82  Pulse: 93  Resp: 17  Temp: 98.7 F (37.1 C)  SpO2: 100%   Filed Weights   06/27/17 1015  Weight: 130 lb 12.8 oz (59.3 kg)    GENERAL:alert, no distress; visibly in pain, writhing in chair  SKIN: skin color, texture, turgor are normal, no rashes or significant lesions EYES: normal, Conjunctiva are pink and non-injected, sclera clear OROPHARYNX:no exudate, no erythema and lips, buccal mucosa, and tongue normal  NECK: supple, thyroid normal size, non-tender, without nodularity LYMPH:  no palpable cervical or supraclavicular lymphadenopathy LUNGS: clear to auscultation bilaterally with normal breathing effort HEART: regular rate & rhythm and  no murmurs and no lower extremity edema ABDOMEN:abdomen soft and normal bowel sounds (+) tenderness to RUQ and upper middle ABD. No palpable hepatomegaly or masses  Musculoskeletal:no cyanosis of digits and no clubbing  NEURO: alert & oriented x 3 with fluent speech, no focal motor deficits. No Madelyn Brunner deficits  LABORATORY DATA:  I have reviewed the data as listed CBC Latest Ref Rng & Units 06/27/2017 06/20/2017 06/13/2017  WBC 3.9 - 10.3 10e3/uL 2.7(L) 3.5(L) 4.0  Hemoglobin 11.6 - 15.9 g/dL 10.8(L) 9.9(L) 10.2(L)  Hematocrit 34.8 - 46.6 % 33.2(L) 30.0(L) 32.1(L)  Platelets 145 - 400 10e3/uL 224 238 194     CMP Latest Ref Rng & Units 06/27/2017 06/20/2017 06/13/2017  Glucose 70 - 140 mg/dl 103 87 99  BUN 7.0 - 26.0 mg/dL 8.7 7.5 7.2  Creatinine 0.6 - 1.1 mg/dL 0.7 0.7 0.7  Sodium 136 - 145 mEq/L 138 139 141  Potassium 3.5 - 5.1 mEq/L 3.9 3.7 3.9  Chloride 101 - 111 mmol/L - - -  CO2 22 - 29 mEq/L 26 26 26   Calcium 8.4 - 10.4 mg/dL 9.2 9.0 9.0  Total Protein 6.4 - 8.3 g/dL 6.7 6.5 6.6  Total Bilirubin 0.20 - 1.20 mg/dL 0.51 0.27 0.27  Alkaline Phos 40 - 150 U/L 37(L) 37(L) 44  AST 5 - 34 U/L 23 29 20   ALT 0 - 55 U/L 24 30 27       RADIOGRAPHIC STUDIES: I have personally reviewed the radiological images as listed and agreed with the findings in the report. No results found.   ASSESSMENT & PLAN: 51 y.o.African-American female, without significant past medical history, presented with abdominal pain  1. Metastatic intrahepatic cholangiocarcinoma, with probable node and lung metastasis, cT2bN1M1, stage IV 2. Abdominal pain, back pain, worsening  3. Nausea and anorexia and weight loss  4. Neutropenia and anemia 5. Constipation 6. Goals of care discussion 7. Oral thrush  8. Peripheral neuropathy, grade 1  Ms. Tumblin presents today with weight loss, intermittent nausea, and poorly controlled abdominal pain. She requires 2 tablets norco q5-6 hours. She was previously  prescribed oxycontin q12 hour, she became nauseated after the first dose and has not taken subsequently. I recommend she retry this medication with food to decrease nausea and achieve better pain control. She will continue norco PRN for pain and was also given Rx for oxy IR for breakthrough pain. We discussed possible increased sedation with concurrent use of opioids, she will use caution. She feels her nausea is perpetuated by increased pain, hopefully better pain management will prevent nausea and she will gain some weight; she will continue to use zofran PRN, she uses with pain meds for optimal benefit. I refilled zofran today. She has stable grade 1 peripheral neuropathy to her feet, likely secondary to chemotherapy. We will continue to hold oxaliplatin until pain, nausea and weight loss improve. Will add emend with oxaliplatin likely with next cycle. Labs reviewed, adequate for treatment. She will proceed with cycle 3 FOLFOX today, hold oxaliplatin; I informed pharmacy and infusion RN. She will not get neulasta with pump d/c. She will return in 2 weeks for f/u and next cycle.   PLAN: -restart oxycontin q12, take with food -Rx for oxy IR for breakthrough, continue norco PRN -refill zofran -proceed with cycle 3 chemo with 5-fu only, oxaliplatin today;  -She will not require Neulasta with pump d/c, I updated treatment plan to reflect this -pt received iv morphine 2mg  once and 1mg  once in infusion room today for her abdominal pain   This was a shared visit with Dr. Burr Medico. All questions were answered. The patient knows to call the clinic with any problems, questions or concerns. No barriers to learning was detected. I spent 20 minutes counseling the patient face to face. The total time spent in the appointment was 25 minutes and more than 50% was on counseling and review of test results     Alla Feeling, NP 06/27/17   I have seen the patient, examined her. I agree with the assessment and and plan  and have edited the notes.   Truitt Merle  06/27/2017

## 2017-06-27 NOTE — Patient Instructions (Signed)
[facility] Discharge Instructions for Patients Receiving Chemotherapy  Today you received the following chemotherapy agents Leucovorin and Fluorouracil  To help prevent nausea and vomiting after your treatment, we encourage you to take your nausea medication as directed   If you develop nausea and vomiting that is not controlled by your nausea medication, call the clinic.   BELOW ARE SYMPTOMS THAT SHOULD BE REPORTED IMMEDIATELY:  *FEVER GREATER THAN 100.5 F  *CHILLS WITH OR WITHOUT FEVER  NAUSEA AND VOMITING THAT IS NOT CONTROLLED WITH YOUR NAUSEA MEDICATION  *UNUSUAL SHORTNESS OF BREATH  *UNUSUAL BRUISING OR BLEEDING  TENDERNESS IN MOUTH AND THROAT WITH OR WITHOUT PRESENCE OF ULCERS  *URINARY PROBLEMS  *BOWEL PROBLEMS  UNUSUAL RASH Items with * indicate a potential emergency and should be followed up as soon as possible.  Feel free to call the clinic should you have any questions or concerns. The clinic phone number is [phone].  Please show the [facility] at check-in to the Emergency Department and triage nurse.

## 2017-06-28 ENCOUNTER — Telehealth: Payer: Self-pay

## 2017-06-28 NOTE — Telephone Encounter (Signed)
No los. Per 10/29

## 2017-06-29 ENCOUNTER — Ambulatory Visit (HOSPITAL_BASED_OUTPATIENT_CLINIC_OR_DEPARTMENT_OTHER): Payer: Federal, State, Local not specified - PPO | Admitting: Medical

## 2017-06-29 ENCOUNTER — Telehealth: Payer: Self-pay | Admitting: Emergency Medicine

## 2017-06-29 ENCOUNTER — Ambulatory Visit: Payer: Federal, State, Local not specified - PPO

## 2017-06-29 VITALS — BP 149/74 | HR 88

## 2017-06-29 VITALS — BP 133/66 | HR 87 | Temp 98.3°F | Resp 18

## 2017-06-29 DIAGNOSIS — R1013 Epigastric pain: Secondary | ICD-10-CM

## 2017-06-29 DIAGNOSIS — C221 Intrahepatic bile duct carcinoma: Secondary | ICD-10-CM

## 2017-06-29 DIAGNOSIS — R110 Nausea: Secondary | ICD-10-CM | POA: Diagnosis not present

## 2017-06-29 DIAGNOSIS — C7801 Secondary malignant neoplasm of right lung: Principal | ICD-10-CM

## 2017-06-29 DIAGNOSIS — C7800 Secondary malignant neoplasm of unspecified lung: Secondary | ICD-10-CM

## 2017-06-29 MED ORDER — HEPARIN SOD (PORK) LOCK FLUSH 100 UNIT/ML IV SOLN
500.0000 [IU] | Freq: Once | INTRAVENOUS | Status: AC
  Administered 2017-06-29: 500 [IU] via INTRAVENOUS
  Filled 2017-06-29: qty 5

## 2017-06-29 MED ORDER — SODIUM CHLORIDE 0.9% FLUSH
10.0000 mL | Freq: Once | INTRAVENOUS | Status: AC
  Administered 2017-06-29: 10 mL via INTRAVENOUS
  Filled 2017-06-29: qty 10

## 2017-06-29 MED ORDER — LORAZEPAM 1 MG PO TABS
0.5000 mg | ORAL_TABLET | Freq: Once | ORAL | Status: AC
  Administered 2017-06-29: 0.5 mg via ORAL

## 2017-06-29 MED ORDER — SODIUM CHLORIDE 0.9% FLUSH
10.0000 mL | INTRAVENOUS | Status: DC | PRN
  Administered 2017-06-29: 10 mL
  Filled 2017-06-29: qty 10

## 2017-06-29 MED ORDER — HEPARIN SOD (PORK) LOCK FLUSH 100 UNIT/ML IV SOLN
500.0000 [IU] | Freq: Once | INTRAVENOUS | Status: DC | PRN
  Filled 2017-06-29: qty 5

## 2017-06-29 MED ORDER — MORPHINE SULFATE 4 MG/ML IJ SOLN
2.0000 mg | Freq: Once | INTRAMUSCULAR | Status: AC
  Administered 2017-06-29: 2 mg via INTRAVENOUS
  Filled 2017-06-29: qty 1

## 2017-06-29 MED ORDER — MORPHINE SULFATE (PF) 4 MG/ML IV SOLN
INTRAVENOUS | Status: AC
  Filled 2017-06-29: qty 1

## 2017-06-29 MED ORDER — LORAZEPAM 1 MG PO TABS
ORAL_TABLET | ORAL | Status: AC
  Filled 2017-06-29: qty 1

## 2017-06-29 MED ORDER — SODIUM CHLORIDE 0.9 % IV SOLN
Freq: Once | INTRAVENOUS | Status: AC
  Administered 2017-06-29: 13:00:00 via INTRAVENOUS

## 2017-06-29 NOTE — Telephone Encounter (Signed)
Pt arrived to Fl[facility] 2 via wheel chair c/o of increased pain to upper abd. Pt states she took Oxycodone at 10am today with no relief. Rates pain at 7 of 10. Janifer Adie, RN made aware and Eustaquio Maize Rn made aware. LOS sent by myrtle for pt to be put on van tanners schedule Urgent. Pt pump removed by this nurse but left accessed. Escorted via wheelchair by this nurse to room 26 for further eval by Lucianne Lei.

## 2017-06-29 NOTE — Progress Notes (Signed)
Pt arrived from Flush 2 after 5FU pump d/c. She is very anxious, c/o persistent nausea and upper abdominal pain. Rocking back and forth, holding a pillow against her abdomen. Rates pain 9/10. Sandi Mealy, PA made aware. Treated nausea with SL Ativan and pain with MS 2mg  IV with relief of symptoms within 15 minutes.  Resting comfortably while receiving IV fluids.  D/c'd home with pt's aunt. Pt verbalizes understanding regarding pain meds and and nausea meds.

## 2017-06-29 NOTE — Patient Instructions (Signed)
Nausea, Adult  Nausea is the feeling of an upset stomach or having to vomit. Nausea on its own is not usually a serious concern, but it may be an early sign of a more serious medical problem. As nausea gets worse, it can lead to vomiting. If vomiting develops, or if you are not able to drink enough fluids, you are at risk of becoming dehydrated. Dehydration can make you tired and thirsty, cause you to have a dry mouth, and decrease how often you urinate. Older adults and people with other diseases or a weak immune system are at higher risk for dehydration. The main goals of treating your nausea are:  · To limit repeated nausea episodes.  · To prevent vomiting and dehydration.    Follow these instructions at home:  Follow instructions from your health care provider about how to care for yourself at home.  Eating and drinking   Follow these recommendations as told by your health care provider:  · Take an oral rehydration solution (ORS). This is a drink that is sold at pharmacies and retail stores.  · Drink clear fluids in small amounts as you are able. Clear fluids include water, ice chips, diluted fruit juice, and low-calorie sports drinks.  · Eat bland, easy-to-digest foods in small amounts as you are able. These foods include bananas, applesauce, rice, lean meats, toast, and crackers.  · Avoid drinking fluids that contain a lot of sugar or caffeine, such as energy drinks, sports drinks, and soda.  · Avoid alcohol.  · Avoid spicy or fatty foods.    General instructions   · Drink enough fluid to keep your urine clear or pale yellow.  · Wash your hands often. If soap and water are not available, use hand sanitizer.  · Make sure that all people in your household wash their hands well and often.  · Rest at home while you recover.  · Take over-the-counter and prescription medicines only as told by your health care provider.  · Breathe slowly and deeply when you feel nauseous.  · Watch your condition for any  changes.  · Keep all follow-up visits as told by your health care provider. This is important.  Contact a health care provider if:  · You have a headache.  · You have new symptoms.  · Your nausea gets worse.  · You have a fever.  · You feel light-headed or dizzy.  · You vomit.  · You cannot keep fluids down.  Get help right away if:  · You have pain in your chest, neck, arm, or jaw.  · You feel extremely weak or you faint.  · You have vomit that is bright red or looks like coffee grounds.  · You have bloody or black stools or stools that look like tar.  · You have a severe headache, a stiff neck, or both.  · You have severe pain, cramping, or bloating in your abdomen.  · You have a rash.  · You have difficulty breathing or are breathing very quickly.  · Your heart is beating very quickly.  · Your skin feels cold and clammy.  · You feel confused.  · You have pain when you urinate.  · You have signs of dehydration, such as:  ? Dark urine, very little, or no urine.  ? Cracked lips.  ? Dry mouth.  ? Sunken eyes.  ? Sleepiness.  ? Weakness.  These symptoms may represent a serious problem that is an emergency. Do   not wait to see if the symptoms will go away. Get medical help right away. Call your local emergency services (911 in the [place] Do not drive yourself to the hospital.  This information is not intended to replace advice given to you by your health care provider. Make sure you discuss any questions you have with your health care provider.  Document Released: 09/23/2004 Document Revised: 01/19/2016 Document Reviewed: 04/22/2015  Elsevier Interactive Patient Education © 2017 [employer]

## 2017-06-30 NOTE — Progress Notes (Signed)
[facility] Progress Note   Nancy [name] [id number] 13-Jan-1966 51 y.o.  Nancy [name] is managed by Dr. 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 She was told to continue using her Compazine and to begin using her Zofran as appropriate.  Epigastric pain: This was discussed with Dr. Burr Medico who reports that the patient's epigastric pain is likely a manifestation of disease progression. The patient was given morphine 2 mg IV 1.  Cholangiocarcinoma with metastasis to the lung: The patient is status post cycle 3 of 5-FU and leucovorin. She will follow-up with Dr. Burr Medico on 07/11/2017 for consideration of cycle 4 of chemotherapy.  Please see After Visit Summary for patient specific instructions.  Future Appointments Date Time Provider [place]  07/11/2017 12:15 PM [facility] LAB 6 [facility] None  07/11/2017 12:30 PM [facility] FLUSH NURSE 2 [facility] None  07/11/2017 1:00 PM Truitt Merle, MD [facility] None  07/11/2017 2:00 PM [facility] B6 [facility] None  07/13/2017 3:45 PM [facility] INJ NURSE [facility] None  07/25/2017 10:45 AM [facility] ONLY [facility] None  07/25/2017 11:00 AM [facility] INJ NURSE [facility] None  07/25/2017 11:30 AM Alla Feeling, NP [facility] None  07/25/2017 12:30 PM [facility] D11 [facility] None  07/27/2017  1:30 PM [facility] INJ NURSE [facility] None    No orders of the defined types were placed in this encounter.      Subjective:   Patient ID:  Nancy [name] is a 51 y.o. (DOB 25-Nov-1965) female.  Chief Complaint:  Chief Complaint  Patient presents with  . Nausea    HPI Nancy [name] is a 51 year old female with a history of a cholangiocarcinoma which is metastatic to the lungs. The patient is status post cycle 3 of 5-FU and leucovorin last dosed on 06/27/2017. The patient presented to the office today for discontinuation of her pump. She reported that she was having nausea but no vomiting despite her use of Compazine and was having upper abdominal pain. She rated her pain is 9/10. The patient had a similar reaction 2 cycles ago but stated that she made it through her last cycle without increased abdominal pain or nausea. She denies fevers, chills, or sweats.  Medications: I have reviewed the patient's current medications.  Allergies:  Allergies  Allergen Reactions  . Penicillins Anaphylaxis    Has patient had a PCN reaction causing immediate rash, facial/tongue/throat swelling, SOB or lightheadedness with hypotensionYes Swelling in throat  Has patient had a PCN reaction causing severe rash involving mucus membranes or skin necrosis:/No Has patient had a PCN reaction that required hospitalization/No Has patient had a PCN reaction occurring within the last 10 years:NO If all of the above answers are "NO", then may proceed with Cephalosporin use.   . Lipitor [Atorvastatin] Palpitations    Tingling, flushing    Past Medical History:  Diagnosis Date  . Allergy    SEASONAL  . Anemia  [employer]  . Atopic eczema   . cholangio ca dx'd 07/12/16  . Fibrocystic breast disease   . GERD (gastroesophageal reflux disease)   . Hx of migraines    seasonal   . Pneumonia    6 years ago   . TIA (transient ischemic attack)     Past Surgical History:  Procedure Laterality  Date  . EUS N/A 07/07/2016   Procedure: UPPER ENDOSCOPIC ULTRASOUND (EUS) RADIAL;  Surgeon: [name], MD;  Location: [facility];  Service: Endoscopy;  Laterality: N/A;  . IR GENERIC HISTORICAL  08/03/2016   IR US GUIDE VASC ACCESS RIGHT 08/03/2016 Corrie Mckusick, DO [facility]  . IR GENERIC HISTORICAL  08/03/2016   IR FLUORO GUIDE PORT INSERTION RIGHT 08/03/2016 Corrie Mckusick, DO [facility]  . MYOMECTOMY  2002    Family History  Problem Relation Age of Onset  . Gout Mother   . Diabetes Mother   . Hypertension Father   . Diabetes Father   . Breast cancer Paternal Aunt   . Cancer Maternal Grandfather        throat cancer   . Cancer Cousin 64       GI cancer   . Thyroid disease Neg Hx     Social History   Social History  . Marital status: Married    Spouse name: N/A  . Number of children: 0  . Years of education: 33   Occupational History  . [employer] f[employer]  .  [employer]   Social History Main Topics  . Smoking status: Never Smoker  . Smokeless tobacco: Never Used  . Alcohol use Yes     Comment: maybe 3 times a year  . Drug use: No  . Sexual activity: Yes    Partners: Male   Other Topics Concern  . Not on file   Social History Narrative  . No narrative on file    Past Medical History, Surgical history, Social history, and Family history were reviewed and updated as appropriate.   Please see review of systems for further details on the patient's review from today.   Review of Systems:  Review of Systems  Constitutional: Negative for chills, diaphoresis and fever.  Gastrointestinal: Positive for abdominal pain and nausea. Negative for constipation, diarrhea and vomiting.    Objective:   Physical Exam:  BP (!) 149/74 (BP Location: Left Arm, Patient Position: Sitting)   Pulse 88  ECOG: 1  Physical Exam  HENT:  Head: Normocephalic and atraumatic.  Cardiovascular: Normal rate, regular rhythm and normal heart sounds.  Exam reveals no gallop and no  friction rub.   No murmur heard. Pulmonary/Chest: Effort normal and breath sounds normal. No respiratory distress. She has no wheezes. She has no rales.  Abdominal: Soft. Bowel sounds are normal. She exhibits no distension. There is tenderness. There is guarding. There is no rebound.  Neurological: She is alert.  Skin: Skin is warm and dry. She is not diaphoretic.    Lab Review:     Component Value Date/Time   NA 138 06/27/2017 0920   K 3.9 06/27/2017 0920   CL 100 (L) 05/17/2017 0335   CO2 26 06/27/2017 0920   GLUCOSE 103 06/27/2017 0920   BUN 8.7 06/27/2017 0920   CREATININE 0.7 06/27/2017 0920   CALCIUM 9.2 06/27/2017 0920   PROT 6.7 06/27/2017 0920   ALBUMIN 4.0 06/27/2017 0920   AST 23 06/27/2017 0920   ALT 24 06/27/2017 0920   ALKPHOS  37 (L) 06/27/2017 0920   BILITOT 0.51 06/27/2017 0920   GFRNONAA >60 05/17/2017 0335   GFRAA >60 05/17/2017 0335       Component Value Date/Time   WBC 2.7 (L) 06/27/2017 0920   WBC 4.0 05/17/2017 0335   RBC 3.80 06/27/2017 0920   RBC 3.99 05/17/2017 0335   HGB 10.8 (L) 06/27/2017 0920   HCT 33.2 (L) 06/27/2017 0920   PLT 224 06/27/2017 0920   MCV 87.5 06/27/2017 0920   MCH 28.5 06/27/2017 0920   MCH 29.6 05/17/2017 0335   MCHC 32.6 06/27/2017 0920   MCHC 34.4 05/17/2017 0335   RDW 15.6 (H) 06/27/2017 0920   LYMPHSABS 1.0 06/27/2017 0920   MONOABS 0.2 06/27/2017 0920   EOSABS 0.0 06/27/2017 0920   BASOSABS 0.0 06/27/2017 0920   -------------------------------  Imaging from last 24 hours (if applicable):  Radiology interpretation: No results found.

## 2017-07-01 ENCOUNTER — Telehealth: Payer: Self-pay | Admitting: *Deleted

## 2017-07-01 DIAGNOSIS — C221 Intrahepatic bile duct carcinoma: Secondary | ICD-10-CM

## 2017-07-01 DIAGNOSIS — C7800 Secondary malignant neoplasm of unspecified lung: Principal | ICD-10-CM

## 2017-07-01 MED ORDER — HYDROCODONE-ACETAMINOPHEN 5-325 MG PO TABS: 1.0000 | ORAL_TABLET | ORAL | 0 refills | Status: DC | PRN

## 2017-07-01 NOTE — Telephone Encounter (Signed)
Received vm call from pt asking for refill on her Norco.  She states she has two pills left. Called pt & she has only taken one of her long acting oxycodone.  Encouraged to r/s long acting & will refill Norco per Dr Burr Medico.

## 2017-07-04 ENCOUNTER — Other Ambulatory Visit: Payer: Self-pay | Admitting: Internal Medicine

## 2017-07-05 ENCOUNTER — Other Ambulatory Visit: Payer: Self-pay | Admitting: Internal Medicine

## 2017-07-11 ENCOUNTER — Ambulatory Visit: Payer: Federal, State, Local not specified - PPO | Admitting: Hematology

## 2017-07-11 ENCOUNTER — Other Ambulatory Visit: Payer: Federal, State, Local not specified - PPO

## 2017-07-11 ENCOUNTER — Ambulatory Visit: Payer: Federal, State, Local not specified - PPO

## 2017-07-11 ENCOUNTER — Telehealth: Payer: Self-pay | Admitting: Hematology

## 2017-07-11 NOTE — Telephone Encounter (Signed)
07/11/2017 @ 4:24 pm called [phone] left message that FMLA forms were completed and ready for pick up per cover sheet request.  Left copies at front desk receptionist.

## 2017-07-13 ENCOUNTER — Ambulatory Visit (HOSPITAL_BASED_OUTPATIENT_CLINIC_OR_DEPARTMENT_OTHER): Payer: Federal, State, Local not specified - PPO

## 2017-07-13 ENCOUNTER — Ambulatory Visit: Payer: Federal, State, Local not specified - PPO

## 2017-07-13 ENCOUNTER — Ambulatory Visit (HOSPITAL_BASED_OUTPATIENT_CLINIC_OR_DEPARTMENT_OTHER): Payer: Federal, State, Local not specified - PPO | Admitting: Medical

## 2017-07-13 ENCOUNTER — Telehealth: Payer: Self-pay

## 2017-07-13 VITALS — BP 158/87 | HR 93 | Temp 98.0°F | Resp 20

## 2017-07-13 DIAGNOSIS — R682 Dry mouth, unspecified: Secondary | ICD-10-CM

## 2017-07-13 DIAGNOSIS — C7801 Secondary malignant neoplasm of right lung: Principal | ICD-10-CM

## 2017-07-13 DIAGNOSIS — C7800 Secondary malignant neoplasm of unspecified lung: Principal | ICD-10-CM

## 2017-07-13 DIAGNOSIS — C221 Intrahepatic bile duct carcinoma: Secondary | ICD-10-CM

## 2017-07-13 DIAGNOSIS — G893 Neoplasm related pain (acute) (chronic): Secondary | ICD-10-CM

## 2017-07-13 DIAGNOSIS — R112 Nausea with vomiting, unspecified: Secondary | ICD-10-CM

## 2017-07-13 LAB — CBC WITH DIFFERENTIAL/PLATELET
BASO%: 0.8 % (ref 0.0–2.0)
Basophils Absolute: 0 10*3/uL (ref 0.0–0.1)
EOS%: 0.3 % (ref 0.0–7.0)
Eosinophils Absolute: 0 10*3/uL (ref 0.0–0.5)
HCT: 35 % (ref 34.8–46.6)
HGB: 11.5 g/dL — ABNORMAL LOW (ref 11.6–15.9)
LYMPH%: 41.6 % (ref 14.0–49.7)
MCH: 28.9 pg (ref 25.1–34.0)
MCHC: 32.8 g/dL (ref 31.5–36.0)
MCV: 87.9 fL (ref 79.5–101.0)
MONO#: 0.2 10*3/uL (ref 0.1–0.9)
MONO%: 5.5 % (ref 0.0–14.0)
NEUT#: 1.8 10*3/uL (ref 1.5–6.5)
NEUT%: 51.8 % (ref 38.4–76.8)
Platelets: 192 10*3/uL (ref 145–400)
RBC: 3.98 10*6/uL (ref 3.70–5.45)
RDW: 14.9 % — ABNORMAL HIGH (ref 11.2–14.5)
WBC: 3.5 10*3/uL — ABNORMAL LOW (ref 3.9–10.3)
lymph#: 1.5 10*3/uL (ref 0.9–3.3)

## 2017-07-13 LAB — COMPREHENSIVE METABOLIC PANEL
ALT: 15 U/L (ref 0–55)
AST: 21 U/L (ref 5–34)
Albumin: 4.3 g/dL (ref 3.5–5.0)
Alkaline Phosphatase: 35 U/L — ABNORMAL LOW (ref 40–150)
Anion Gap: 15 mEq/L — ABNORMAL HIGH (ref 3–11)
BUN: 11.5 mg/dL (ref 7.0–26.0)
CO2: 21 mEq/L — ABNORMAL LOW (ref 22–29)
Calcium: 9.3 mg/dL (ref 8.4–10.4)
Chloride: 101 mEq/L (ref 98–109)
Creatinine: 0.7 mg/dL (ref 0.6–1.1)
EGFR: >60 mL/min/{1.73_m2} (ref >60)
Glucose: 62 mg/dl — ABNORMAL LOW (ref 70–140)
Potassium: 3.9 mEq/L (ref 3.5–5.1)
Sodium: 137 mEq/L (ref 136–145)
Total Bilirubin: 0.69 mg/dL (ref 0.20–1.20)
Total Protein: 7.2 g/dL (ref 6.4–8.3)

## 2017-07-13 LAB — MAGNESIUM: Magnesium: 2 mg/dl (ref 1.5–2.5)

## 2017-07-13 MED ORDER — MORPHINE SULFATE (PF) 4 MG/ML IV SOLN
INTRAVENOUS | Status: AC
  Filled 2017-07-13: qty 1

## 2017-07-13 MED ORDER — SODIUM CHLORIDE 0.9 % IV SOLN
Freq: Once | INTRAVENOUS | Status: AC
  Administered 2017-07-13: 13:00:00 via INTRAVENOUS

## 2017-07-13 MED ORDER — HEPARIN SOD (PORK) LOCK FLUSH 100 UNIT/ML IV SOLN
500.0000 [IU] | Freq: Once | INTRAVENOUS | Status: DC | PRN
  Filled 2017-07-13: qty 5

## 2017-07-13 MED ORDER — MORPHINE SULFATE 4 MG/ML IJ SOLN
2.0000 mg | Freq: Once | INTRAMUSCULAR | Status: AC
Start: 2017-07-13 — End: 2017-07-13
  Administered 2017-07-13: 2 mg via INTRAVENOUS
  Filled 2017-07-13: qty 1

## 2017-07-13 MED ORDER — OXYCODONE HCL ER 20 MG PO T12A: 20.0000 mg | EXTENDED_RELEASE_TABLET | Freq: Two times a day (BID) | ORAL | 0 refills | Status: DC

## 2017-07-13 MED ORDER — HEPARIN SOD (PORK) LOCK FLUSH 100 UNIT/ML IV SOLN
500.0000 [IU] | Freq: Once | INTRAVENOUS | Status: AC
  Administered 2017-07-13: 500 [IU] via INTRAVENOUS
  Filled 2017-07-13: qty 5

## 2017-07-13 MED ORDER — SODIUM CHLORIDE 0.9% FLUSH
10.0000 mL | Freq: Once | INTRAVENOUS | Status: AC
  Administered 2017-07-13: 10 mL via INTRAVENOUS
  Filled 2017-07-13: qty 10

## 2017-07-13 MED ORDER — SCOPOLAMINE 1 MG/3DAYS TD PT72: 1.0000 | MEDICATED_PATCH | TRANSDERMAL | 2 refills | Status: DC

## 2017-07-13 MED ORDER — SODIUM CHLORIDE 0.9% FLUSH
10.0000 mL | INTRAVENOUS | Status: DC | PRN
  Administered 2017-07-13: 10 mL
  Filled 2017-07-13: qty 10

## 2017-07-13 MED ORDER — HYDROCODONE-ACETAMINOPHEN 5-325 MG PO TABS: 1.0000 | ORAL_TABLET | ORAL | 0 refills | Status: DC | PRN

## 2017-07-13 NOTE — Patient Instructions (Addendum)
Sugar free candy for mouth dryness   Dehydration, Adult Dehydration is a condition in which there is not enough fluid or water in the body. This happens when you lose more fluids than you take in. Important organs, such as the kidneys, brain, and heart, cannot function without a proper amount of fluids. Any loss of fluids from the body can lead to dehydration. Dehydration can range from mild to severe. This condition should be treated right away to prevent it from becoming severe. What are the causes? This condition may be caused by:  Vomiting.  Diarrhea.  Excessive sweating, such as from heat exposure or exercise.  Not drinking enough fluid, especially: ? When ill. ? While doing activity that requires a lot of energy.  Excessive urination.  Fever.  Infection.  Certain medicines, such as medicines that cause the body to lose excess fluid (diuretics).  Inability to access safe drinking water.  Reduced physical ability to get adequate water and food.  What increases the risk? This condition is more likely to develop in people:  Who have a poorly controlled long-term (chronic) illness, such as diabetes, heart disease, or kidney disease.  Who are age 50 or older.  Who are disabled.  Who live in a place with high altitude.  Who play endurance sports.  What are the signs or symptoms? Symptoms of mild dehydration may include:  Thirst.  Dry lips.  Slightly dry mouth.  Dry, warm skin.  Dizziness. Symptoms of moderate dehydration may include:  Very dry mouth.  Muscle cramps.  Dark urine. Urine may be the color of tea.  Decreased urine production.  Decreased tear production.  Heartbeat that is irregular or faster than normal (palpitations).  Headache.  Light-headedness, especially when you stand up from a sitting position.  Fainting (syncope). Symptoms of severe dehydration may include:  Changes in skin, such as: ? Cold and clammy skin. ? Blotchy  (mottled) or pale skin. ? Skin that does not quickly return to normal after being lightly pinched and released (poor skin turgor).  Changes in body fluids, such as: ? Extreme thirst. ? No tear production. ? Inability to sweat when body temperature is high, such as in hot weather. ? Very little urine production.  Changes in vital signs, such as: ? Weak pulse. ? Pulse that is more than 100 beats a minute when sitting still. ? Rapid breathing. ? Low blood pressure.  Other changes, such as: ? Sunken eyes. ? Cold hands and feet. ? Confusion. ? Lack of energy (lethargy). ? Difficulty waking up from sleep. ? Short-term weight loss. ? Unconsciousness. How is this diagnosed? This condition is diagnosed based on your symptoms and a physical exam. Blood and urine tests may be done to help confirm the diagnosis. How is this treated? Treatment for this condition depends on the severity. Mild or moderate dehydration can often be treated at home. Treatment should be started right away. Do not wait until dehydration becomes severe. Severe dehydration is an emergency and it needs to be treated in a hospital. Treatment for mild dehydration may include:  Drinking more fluids.  Replacing salts and minerals in your blood (electrolytes) that you may have lost. Treatment for moderate dehydration may include:  Drinking an oral rehydration solution (ORS). This is a drink that helps you replace fluids and electrolytes (rehydrate). It can be found at pharmacies and retail stores. Treatment for severe dehydration may include:  Receiving fluids through an IV tube.  Receiving an electrolyte solution through a  feeding tube that is passed through your nose and into your stomach (nasogastric tube, or NG tube).  Correcting any abnormalities in electrolytes.  Treating the underlying cause of dehydration. Follow these instructions at home:  If directed by your health care provider, drink an ORS: ? Make an  ORS by following instructions on the package. ? Start by drinking small amounts, about  cup (120 mL) every 5-10 minutes. ? Slowly increase how much you drink until you have taken the amount recommended by your health care provider.  Drink enough clear fluid to keep your urine clear or pale yellow. If you were told to drink an ORS, finish the ORS first, then start slowly drinking other clear fluids. Drink fluids such as: ? Water. Do not drink only water. Doing that can lead to having too little salt (sodium) in the body (hyponatremia). ? Ice chips. ? Fruit juice that you have added water to (diluted fruit juice). ? Low-calorie sports drinks.  Avoid: ? Alcohol. ? Drinks that contain a lot of sugar. These include high-calorie sports drinks, fruit juice that is not diluted, and soda. ? Caffeine. ? Foods that are greasy or contain a lot of fat or sugar.  Take over-the-counter and prescription medicines only as told by your health care provider.  Do not take sodium tablets. This can lead to having too much sodium in the body (hypernatremia).  Eat foods that contain a healthy balance of electrolytes, such as bananas, oranges, potatoes, tomatoes, and spinach.  Keep all follow-up visits as told by your health care provider. This is important. Contact a health care provider if:  You have abdominal pain that: ? Gets worse. ? Stays in one area (localizes).  You have a rash.  You have a stiff neck.  You are more irritable than usual.  You are sleepier or more difficult to wake up than usual.  You feel weak or dizzy.  You feel very thirsty.  You have urinated only a small amount of very dark urine over 6-8 hours. Get help right away if:  You have symptoms of severe dehydration.  You cannot drink fluids without vomiting.  Your symptoms get worse with treatment.  You have a fever.  You have a severe headache.  You have vomiting or diarrhea that: ? Gets worse. ? Does not go  away.  You have blood or green matter (bile) in your vomit.  You have blood in your stool. This may cause stool to look black and tarry.  You have not urinated in 6-8 hours.  You faint.  Your heart rate while sitting still is over 100 beats a minute.  You have trouble breathing. This information is not intended to replace advice given to you by your health care provider. Make sure you discuss any questions you have with your health care provider. Document Released: 08/16/2005 Document Revised: 03/12/2016 Document Reviewed: 10/10/2015 Elsevier Interactive Patient Education  [employer].

## 2017-07-13 NOTE — Progress Notes (Signed)
Sympt[facility] Management Clinic Progress Note   Nancy [name] [id number] [birth year] 51 y.o.  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  She was also given a new prescription for OxyContin 20 mg every 12 hours and was given a refill of Norco 5-3 25.  Cholangiocarcinoma with metastatic disease to the lung: The patient will follow up as scheduled on 07/18/2017.  She has been referred for restaging CT scans.  Non-intractable vomiting with nausea: Patient has been given a prescription for scopolamine transdermal patches 1 patch every 3 days for nausea.  She has been instructed to continue using her Ativan Zofran as needed.  Xerosis: The patient was told to get sugar-free candy which could help to keep her mouth moistened.  Please see After Visit Summary for patient specific instructions.  Future Appointments  Date Time Provider [place]  07/18/2017 10:00 AM [facility] A2 [facility] None  07/18/2017 10:00 AM [facility] ONLY [facility] None  07/18/2017 11:30 AM Curcio, Roselie Awkward, NP [facility] None  07/18/2017 12:45 PM [facility] F21 [facility] None  07/20/2017  2:30 PM [facility] FLUSH NURSE 2 [facility] None    07/25/2017 10:45 AM [facility] ONLY [facility] None  07/25/2017 11:00 AM [facility] INJ NURSE [facility] None  07/25/2017 11:30 AM Nancy Feeling, NP [facility] None  07/25/2017 12:30 PM [facility] F19 [facility] None  07/27/2017  1:30 PM [facility] INJ NURSE [facility] None    No orders of the defined types were placed in this encounter.      Subjective:   Patient ID:  Nancy [name] is a 51 y.o. (DOB 07-03-1966) female.  Chief Complaint:  Chief Complaint  Patient presents with  . Nausea    HPI Nancy [name] is a 51 year old female with a history of a cholangiocarcinoma which is metastatic to the lungs. The patient is status post cycle 3 of 5-FU and leucovorin last dosed on 06/27/2017. The patient is having nausea and vomiting.  She vomited 3 times yesterday.  She is not eating or drinking much.  She reports having some mouth dryness.  She takes Ativan at 5 AM which helps with her nausea.  This helps more than Zofran does.  Her last bowel movement was yesterday.  She continues to have cramping abdominal pain.  She has nausea with her pain medications.  She has been taking OxyContin ER 10 mg p.o. twice daily and 4-6 Norco per day.  She denies fevers, chills, sweats, shortness of breath, or cough.  Medications: I have reviewed the patient's current medications.  Allergies:  Allergies  Allergen Reactions  . Penicillins Anaphylaxis    Has patient had a PCN reaction causing immediate rash, facial/tongue/throat swelling, SOB or lightheadedness with hypotensionYes Swelling in throat  Has patient had a PCN reaction causing severe rash involving mucus membranes or  skin necrosis:/No Has patient had a PCN reaction that required hospitalization/No Has patient had a PCN reaction occurring within the last 10 years:NO If all of the above answers are "NO", then may proceed with Cephalosporin use.   . Lipitor [Atorvastatin] Palpitations    Tingling, flushing    Past Medical  History:  Diagnosis Date  . Allergy    SEASONAL  . Anemia    HIGH SCHOOL  . Atopic eczema   . cholangio ca dx'd 07/12/16  . Fibrocystic breast disease   . GERD (gastroesophageal reflux disease)   . Hx of migraines    seasonal   . Pneumonia    6 years ago   . TIA (transient ischemic attack)     Past Surgical History:  Procedure Laterality Date  . IR GENERIC HISTORICAL  08/03/2016   IR US GUIDE VASC ACCESS RIGHT 08/03/2016 Nancy Mckusick, DO [facility]  . IR GENERIC HISTORICAL  08/03/2016   IR FLUORO GUIDE PORT INSERTION RIGHT 08/03/2016 Nancy Mckusick, DO [facility]  . MYOMECTOMY  2002    Family History  Problem Relation Age of Onset  . Gout Mother   . Diabetes Mother   . Hypertension Father   . Diabetes Father   . Breast cancer Paternal Aunt   . Cancer Maternal Grandfather        throat cancer   . Cancer Cousin 17       GI cancer   . Thyroid disease Neg Hx     Social History   Socioeconomic History  . Marital status: Married    Spouse name: Not on file  . Number of children: 0  . Years of education: 61  . Highest education level: Not on file  Social Needs  . Financial resource strain: Not on file  . Food insecurity - worry: Not on file  . Food insecurity - inability: Not on file  . Transportation needs - medical: Not on file  . Transportation needs - non-medical: Not on file  Occupational History  . Occupation: HR fmla    Employer: [employer]    Employer: [employer]  Tobacco Use  . Smoking status: Never Smoker  . Smokeless tobacco: Never Used  Substance and Sexual Activity  . Alcohol use: Yes    Comment: maybe 3 times a year  . Drug use: No  . Sexual activity: Yes    Partners: Male  Other Topics Concern  . Not on file  Social History Narrative  . Not on file    Past Medical History, Surgical history, Social history, and Family history were reviewed and updated as appropriate.   Please see review of systems for further details on the patient's  review from today.   Review of Systems:  Review of Systems  Constitutional: Positive for appetite change and fatigue. Negative for chills, diaphoresis and fever.  HENT: Negative for mouth sores and trouble swallowing.   Respiratory: Negative for cough, choking and shortness of breath.   Gastrointestinal: Positive for abdominal pain, nausea and vomiting. Negative for constipation and diarrhea.  Genitourinary: Negative for difficulty urinating and dysuria.    Objective:   Physical Exam:  BP (!) 158/87 (BP Location: Right Arm, Patient Position: Sitting)   Pulse 93   Temp 98 F (36.7 C) (Oral)   Resp 20   SpO2 100%  ECOG: 1  Physical Exam  Constitutional: No distress.  HENT:  Head: Normocephalic and atraumatic.  Mouth/Throat: Oropharynx is clear and moist. No oropharyngeal exudate.  Eyes: Right eye exhibits no discharge. Left eye exhibits no discharge. No scleral icterus.  Neck: Normal range of motion. Neck supple.  Cardiovascular: S1 normal and S2 normal. Tachycardia present.  Pulmonary/Chest: Effort normal and breath sounds normal. No respiratory distress. She has no wheezes. She has no rales.  Abdominal: Soft. Bowel sounds are normal. She exhibits no distension. There is tenderness (Tenderness across the epigastrium). There is no rebound and no guarding.  Lymphadenopathy:    She has no cervical adenopathy.  Neurological: She is alert.  Skin: Skin is warm and dry. She is not diaphoretic.    Lab Review:     Component Value Date/Time   NA 137 07/13/2017 1027   K 3.9 07/13/2017 1027   CL 100 (L) 05/17/2017 0335   CO2 21 (L) 07/13/2017 1027   GLUCOSE 62 (L) 07/13/2017 1027   BUN 11.5 07/13/2017 1027   CREATININE 0.7 07/13/2017 1027   CALCIUM 9.3 07/13/2017 1027   PROT 7.2 07/13/2017 1027   ALBUMIN 4.3 07/13/2017 1027   AST 21 07/13/2017 1027   ALT 15 07/13/2017 1027   ALKPHOS 35 (L) 07/13/2017 1027   BILITOT 0.69 07/13/2017 1027   GFRNONAA >60 05/17/2017 0335   GFRAA  >60 05/17/2017 0335       Component Value Date/Time   WBC 3.5 (L) 07/13/2017 1027   WBC 4.0 05/17/2017 0335   RBC 3.98 07/13/2017 1027   RBC 3.99 05/17/2017 0335   HGB 11.5 (L) 07/13/2017 1027   HCT 35.0 07/13/2017 1027   PLT 192 07/13/2017 1027   MCV 87.9 07/13/2017 1027   MCH 28.9 07/13/2017 1027   MCH 29.6 05/17/2017 0335   MCHC 32.8 07/13/2017 1027   MCHC 34.4 05/17/2017 0335   RDW 14.9 (H) 07/13/2017 1027   LYMPHSABS 1.5 07/13/2017 1027   MONOABS 0.2 07/13/2017 1027   EOSABS 0.0 07/13/2017 1027   BASOSABS 0.0 07/13/2017 1027   -------------------------------  Imaging from last 24 hours (if applicable):  Radiology interpretation: No results found.      This case was discussed with Dr. Burr Medico. She expressed agreement with my management of this patient.

## 2017-07-13 NOTE — Telephone Encounter (Signed)
Pt is having n/v. Vomited 3x yesterday. Not eating or drinking much. Is having pain medication management issues with stomach pain. This has been going on about 3-4 days. She is wanting to see [facility]. She states she can be here by 0945. inbasket for lab and [facility] sent. Cbc, cmet, mag, ordered.

## 2017-07-18 ENCOUNTER — Ambulatory Visit: Payer: Federal, State, Local not specified - PPO

## 2017-07-18 ENCOUNTER — Other Ambulatory Visit: Payer: Self-pay | Admitting: Medical

## 2017-07-18 ENCOUNTER — Encounter: Payer: Self-pay | Admitting: Oncology

## 2017-07-18 ENCOUNTER — Other Ambulatory Visit (HOSPITAL_BASED_OUTPATIENT_CLINIC_OR_DEPARTMENT_OTHER): Payer: Federal, State, Local not specified - PPO

## 2017-07-18 ENCOUNTER — Other Ambulatory Visit: Payer: Federal, State, Local not specified - PPO

## 2017-07-18 ENCOUNTER — Ambulatory Visit (HOSPITAL_BASED_OUTPATIENT_CLINIC_OR_DEPARTMENT_OTHER): Payer: Federal, State, Local not specified - PPO | Admitting: Oncology

## 2017-07-18 VITALS — BP 146/87 | HR 98 | Temp 98.5°F | Resp 18 | Ht 66.0 in | Wt 118.4 lb

## 2017-07-18 DIAGNOSIS — R634 Abnormal weight loss: Secondary | ICD-10-CM

## 2017-07-18 DIAGNOSIS — C221 Intrahepatic bile duct carcinoma: Secondary | ICD-10-CM

## 2017-07-18 DIAGNOSIS — B370 Candidal stomatitis: Secondary | ICD-10-CM

## 2017-07-18 DIAGNOSIS — R109 Unspecified abdominal pain: Secondary | ICD-10-CM

## 2017-07-18 DIAGNOSIS — B379 Candidiasis, unspecified: Secondary | ICD-10-CM

## 2017-07-18 DIAGNOSIS — G620 Drug-induced polyneuropathy: Secondary | ICD-10-CM | POA: Diagnosis not present

## 2017-07-18 DIAGNOSIS — C7801 Secondary malignant neoplasm of right lung: Secondary | ICD-10-CM

## 2017-07-18 DIAGNOSIS — R112 Nausea with vomiting, unspecified: Secondary | ICD-10-CM | POA: Diagnosis not present

## 2017-07-18 DIAGNOSIS — C7800 Secondary malignant neoplasm of unspecified lung: Principal | ICD-10-CM

## 2017-07-18 DIAGNOSIS — R630 Anorexia: Secondary | ICD-10-CM

## 2017-07-18 DIAGNOSIS — R110 Nausea: Secondary | ICD-10-CM

## 2017-07-18 DIAGNOSIS — M545 Low back pain: Secondary | ICD-10-CM

## 2017-07-18 DIAGNOSIS — K5900 Constipation, unspecified: Secondary | ICD-10-CM

## 2017-07-18 DIAGNOSIS — G893 Neoplasm related pain (acute) (chronic): Secondary | ICD-10-CM

## 2017-07-18 DIAGNOSIS — R911 Solitary pulmonary nodule: Secondary | ICD-10-CM | POA: Diagnosis not present

## 2017-07-18 DIAGNOSIS — C786 Secondary malignant neoplasm of retroperitoneum and peritoneum: Secondary | ICD-10-CM

## 2017-07-18 LAB — COMPREHENSIVE METABOLIC PANEL
ALT: 14 U/L (ref 0–55)
AST: 20 U/L (ref 5–34)
Albumin: 4.6 g/dL (ref 3.5–5.0)
Alkaline Phosphatase: 35 U/L — ABNORMAL LOW (ref 40–150)
Anion Gap: 19 mEq/L — ABNORMAL HIGH (ref 3–11)
BUN: 11.2 mg/dL (ref 7.0–26.0)
CO2: 18 mEq/L — ABNORMAL LOW (ref 22–29)
Calcium: 9.8 mg/dL (ref 8.4–10.4)
Chloride: 101 mEq/L (ref 98–109)
Creatinine: 0.9 mg/dL (ref 0.6–1.1)
EGFR: >60 mL/min/{1.73_m2} (ref >60)
Glucose: 82 mg/dl (ref 70–140)
Potassium: 3.8 mEq/L (ref 3.5–5.1)
Sodium: 138 mEq/L (ref 136–145)
Total Bilirubin: 0.59 mg/dL (ref 0.20–1.20)
Total Protein: 7.8 g/dL (ref 6.4–8.3)

## 2017-07-18 LAB — CBC WITH DIFFERENTIAL/PLATELET
BASO%: 0.7 % (ref 0.0–2.0)
Basophils Absolute: 0 10*3/uL (ref 0.0–0.1)
EOS%: 0.4 % (ref 0.0–7.0)
Eosinophils Absolute: 0 10*3/uL (ref 0.0–0.5)
HCT: 38.2 % (ref 34.8–46.6)
HGB: 12.8 g/dL (ref 11.6–15.9)
LYMPH%: 45.6 % (ref 14.0–49.7)
MCH: 28.4 pg (ref 25.1–34.0)
MCHC: 33.5 g/dL (ref 31.5–36.0)
MCV: 84.9 fL (ref 79.5–101.0)
MONO#: 0.2 10*3/uL (ref 0.1–0.9)
MONO%: 8.8 % (ref 0.0–14.0)
NEUT#: 1.2 10*3/uL — ABNORMAL LOW (ref 1.5–6.5)
NEUT%: 44.5 % (ref 38.4–76.8)
Platelets: 232 10*3/uL (ref 145–400)
RBC: 4.5 10*6/uL (ref 3.70–5.45)
RDW: 13.9 % (ref 11.2–14.5)
WBC: 2.7 10*3/uL — ABNORMAL LOW (ref 3.9–10.3)
lymph#: 1.2 10*3/uL (ref 0.9–3.3)

## 2017-07-18 MED ORDER — DEXAMETHASONE SODIUM PHOSPHATE 10 MG/ML IJ SOLN
10.0000 mg | Freq: Once | INTRAMUSCULAR | Status: AC
  Administered 2017-07-18: 10 mg via INTRAVENOUS

## 2017-07-18 MED ORDER — MORPHINE SULFATE 4 MG/ML IJ SOLN
2.0000 mg | Freq: Once | INTRAMUSCULAR | Status: DC
  Filled 2017-07-18: qty 1

## 2017-07-18 MED ORDER — SODIUM CHLORIDE 0.9% FLUSH
10.0000 mL | INTRAVENOUS | Status: DC | PRN
  Administered 2017-07-18: 10 mL
  Filled 2017-07-18: qty 10

## 2017-07-18 MED ORDER — SODIUM CHLORIDE 0.9 % IV SOLN
Freq: Once | INTRAVENOUS | Status: AC
  Administered 2017-07-18: 13:00:00 via INTRAVENOUS

## 2017-07-18 MED ORDER — DEXAMETHASONE SODIUM PHOSPHATE 100 MG/10ML IJ SOLN
Freq: Once | INTRAMUSCULAR | Status: DC
Start: 2017-07-18 — End: 2017-07-18

## 2017-07-18 MED ORDER — ONDANSETRON HCL 4 MG/2ML IJ SOLN
8.0000 mg | Freq: Once | INTRAMUSCULAR | Status: AC
  Administered 2017-07-18: 8 mg via INTRAVENOUS

## 2017-07-18 MED ORDER — MORPHINE SULFATE 4 MG/ML IJ SOLN
4.0000 mg | Freq: Once | INTRAMUSCULAR | Status: AC
  Administered 2017-07-18: 4 mg via INTRAVENOUS
  Filled 2017-07-18: qty 1

## 2017-07-18 MED ORDER — HEPARIN SOD (PORK) LOCK FLUSH 100 UNIT/ML IV SOLN
500.0000 [IU] | Freq: Once | INTRAVENOUS | Status: AC | PRN
  Administered 2017-07-18: 500 [IU]
  Filled 2017-07-18: qty 5

## 2017-07-18 MED ORDER — FLUCONAZOLE 100 MG PO TABS: 100.0000 mg | ORAL_TABLET | Freq: Every day | ORAL | 0 refills | Status: DC

## 2017-07-18 MED ORDER — DEXAMETHASONE SODIUM PHOSPHATE 10 MG/ML IJ SOLN
INTRAMUSCULAR | Status: AC
  Filled 2017-07-18: qty 1

## 2017-07-18 MED ORDER — OXYCODONE HCL 5 MG PO TABS: 5.0000 mg | ORAL_TABLET | ORAL | 0 refills | Status: DC | PRN

## 2017-07-18 MED ORDER — ONDANSETRON HCL 4 MG/2ML IJ SOLN
INTRAMUSCULAR | Status: AC
  Filled 2017-07-18: qty 4

## 2017-07-18 MED ORDER — MORPHINE SULFATE (PF) 4 MG/ML IV SOLN
INTRAVENOUS | Status: AC
  Filled 2017-07-18: qty 1

## 2017-07-18 NOTE — Patient Instructions (Addendum)
Continue Oxycontin 20 mg every 12 hours. May change to every 8 hours if pain not controlled.  Stop Hydrocodone Use Oxycodone 1-2 tablets every 4 hours as needed for pain.  Pick up fluconazole at your pharmacy. Take 1 daily for 10 days. This is for thrush.

## 2017-07-18 NOTE — Progress Notes (Signed)
[facility] OFFICE PROGRESS NOTE  Nancy Held, DO [address] [place] [address]  DIAGNOSIS: Metastatic cholangiocarcinoma to the lung  Oncology History   Metastatic cholangiocarcinoma to lung [facility])   Staging form: Perihilar Bile Ducts, AJCC 7th Edition   - Clinical stage from 07/07/2016: Stage IVB (T2b, N1, M1) - Signed by Truitt Merle, MD on 07/28/2016      Metastatic cholangiocarcinoma to lung ([place])   05/22/2014 Procedure    Routine screening colonoscopy showed a sessile polyp measuring 4 mm in the sigmoid colon, removed, otherwise negative.      06/29/2016 Imaging    CT chest, abdomen and pelvis showed 2 indeterminant nodule in left and the right lung, 4-35mm, indeterminate a hypovascular liver lesions largest in the caudate lobe measuring 2.6 cm, mild abdominal lymphadenopathy in the gastrohepatic ligament and portocaval space.      07/02/2016 Imaging    Abdominal MRI with and without contrast showed 2 lobular lesions in the caudate lobe, and is regular lymph nodes in the gastrohepatic ligament which is adjacent to the liver lesion, suspicious for malignancy. 2 small lesions in the right hepatic lobe are indeterminate, but concerning for metastasis.      07/07/2016 Procedure    EGD was negative, EUS biopsy of the liver and adjacent lymph node      07/07/2016 Initial Diagnosis    Metastatic cholangiocarcinoma to lung ([place])      07/07/2016 Initial Biopsy    Fine-needle aspiration of the liver lesion in caudate lobe and adjacent lymph nodes both showed metastatic adenocarcinoma.      08/11/2016 - 04/25/2017 Chemotherapy    Cisplatin 25 mg/m, gemcitabine 1000 mg/m, on day one and 8, every 21 days   Cisplatin and gemcitabine on Day 1, 8 every 21 days, with neulasta on day 9, Gemcitabine dosed reduced to 800 mg/m due to cytopenias, increased to 900mg /m2 on 12/06/2016. Changes to gemcitabine maintenance therapy on day 1 and 8 every 21 days,  from 04/18/2017, gemcitabine was reduced dose to 400 mg/m on 04/25/17       10/18/2016 Imaging    CT CAP  IMPRESSION: 1. Again noted are multifocal liver metastasis. Overall there has been no significant interval change in overall volume of liver metastases. 2. Similar appearance of upper abdominal adenopathy 3. Slight decrease in size of small pulmonary metastases.      01/25/2017 Imaging    CT CAP W Contrast 01/25/17 IMPRESSION: 1. Response to therapy. 2. Similar to minimal decrease in pulmonary nodules/metastasis. 3. Improvement in hepatic disease burden. 4. Decrease in abdominal adenopathy. 5. Significantly age advanced atherosclerosis, including within the coronary arteries. 6. Uterine fibroids. 7. Suspect a complex cyst in the left ovary. Recommend attention on follow-up.       04/13/2017 Imaging    CT CAP W Contrast IMPRESSION: 1. The dominant caudate lobe mass is stable in size and appearance, as is the indistinct adenopathy in the porta hepatis adjacent to the caudate lobe, and in the retroperitoneum. Several subtle additional liver lesions are slightly less conspicuous on today' s exam. The pulmonary nodules are stable, and mild omental nodularity in the left lower quadrant is very slightly worsened compared to prior. Overall the burden of malignancy appears similar to prior, and some of the findings may represent effectively treated metastatic lesions. 2. Complex left adnexal mass, 6.7 by 4.1 cm, enlarging. Pelvic sonography recommended for further characterization. 3. Other imaging findings of potential clinical significance: Aortic  Atherosclerosis (ICD10-I70.0). Coronary atherosclerosis. Prominent stool throughout the colon favors constipation. Fibroid uterus.      05/16/2017 -  Chemotherapy    Second-line chemo FOLFOX every 2 weeks with neulasta after pump DC      CURRENT THERAPY: Second-line chemo FOLFOX every 2 weeks with neulasta starting 05/16/17; Oxali  [name] with cycle 2 and 3 due to delayed nausea, vomiting, weight loss, and abdominal pain.  Status post 3 cycles.  INTERVAL HISTORY: Aeriel [name] 51 y.o. female returns for routine follow-up with her mother.  The patient is having significant pain today.  Her pain is in her abdomen and she reports it as a cramping type pain.  She rates her pain a 10 out of 10.  She reports that she has been using her OxyContin 20 mg every 12 hours and then using one Norco every 4 hours for breakthrough pain.  She is not getting complete relief with her current pain medication regimen.  She reports that she is not eating very well and has taste bud changes.  She noticed a white coating to her tongue.  She continues to lose weight.  She does have some nausea and vomiting when taking the Norco.  If she takes Zofran prior to her Norco, but this usually helps.  She reports that she does not have difficulty with nausea and vomiting with oxycodone, but she has not been taking this and is out of this.  She denies fevers and chills.  Denies chest pain, shortness of breath, cough, hemoptysis.  She reports constipation and plans to begin her Senokot as today.  Restaging CT scans have been ordered, but the patient has not yet scheduled this.  The patient is here for evaluation prior to cycle 4 of her chemotherapy.  MEDICAL HISTORY: Past Medical History:  Diagnosis Date  . Allergy    SEASONAL  . Anemia    [employer]  . Atopic eczema   . cholangio ca dx'd 07/12/16  . Fibrocystic breast disease   . GERD (gastroesophageal reflux disease)   . Hx of migraines    seasonal   . Pneumonia    6 years ago   . TIA (transient ischemic attack)     ALLERGIES:  is allergic to penicillins and lipitor [atorvastatin].  MEDICATIONS:  Current Outpatient Medications  Medication Sig Dispense Refill  . ALPRAZolam (XANAX) 0.5 MG tablet Take 1 tablet (0.5 mg total) by mouth as needed for anxiety. (Patient not taking: Reported on  07/13/2017) 10 tablet 0  . aspirin 81 MG tablet Take 81 mg by mouth daily.    . cetirizine (ZYRTEC) 10 MG tablet Take 10 mg by mouth daily as needed. For allergies     . Cholecalciferol (VITAMIN D3) 5000 UNITS CAPS Take 1 capsule by mouth daily.    . COLOSTRUM PO Take 1.25 g by mouth 2 (two) times daily. 1.25 grams twice daily    . dicyclomine (BENTYL) 20 MG tablet 3 (three) times daily as needed.     . dronabinol (MARINOL) 5 MG capsule Take 1 capsule (5 mg total) by mouth 2 (two) times daily before a meal. 60 capsule 1  . HYDROcodone-acetaminophen (NORCO) 5-325 MG tablet Take 1-2 tablets every 4 (four) hours as needed by mouth for severe pain. 120 tablet 0  . lidocaine-prilocaine (EMLA) cream Apply 1 application topically as needed. Apply to portacath 1 1/2 hours - 2 hours prior to procedures as needed. 30 g 1  . LORazepam (ATIVAN) 0.5 MG tablet Place under  tongue and dissolve every 8 hours as needed for nausea. Do not take Xanax while taking this medication. 30 tablet 0  . magnesium hydroxide (MILK OF MAGNESIA) 400 MG/5ML suspension Take 30 mLs by mouth daily as needed for mild constipation.    . Menaquinone-7 (VITAMIN K2) 40 MCG TABS Take by mouth.    . mometasone (ELOCON) 0.1 % lotion Apply topically daily. 60 mL 0  . OLANZapine (ZYPREXA) 2.5 MG tablet Take 1 tablet (2.5 mg total) by mouth at bedtime. 30 tablet 1  . omeprazole (PRILOSEC) 40 MG capsule Take 1 capsule (40 mg total) by mouth 2 (two) times daily before a meal. (Patient not taking: Reported on 07/13/2017) 60 capsule 11  . ondansetron (ZOFRAN) 8 MG tablet Take 1 tablet (8 mg total) by mouth every 8 (eight) hours as needed for nausea or vomiting. 30 tablet 3  . oxyCODONE (OXY IR/ROXICODONE) 5 MG immediate release tablet Take 1 tablet (5 mg total) by mouth every 4 (four) hours as needed for severe pain. 40 tablet 0  . oxyCODONE (OXYCONTIN) 20 mg 12 hr tablet Take 1 tablet (20 mg total) every 12 (twelve) hours by mouth. 60 tablet 0  .  Prenatal Vit-Fe Fumarate-FA (PRENATAL MULTIVITAMIN) TABS tablet Take 2 tablets by mouth daily at 12 noon.    . promethazine (PHENERGAN) 25 MG tablet Take 1 tablet (25 mg total) by mouth every 6 (six) hours as needed for nausea or vomiting. 40 tablet 0  . scopolamine (TRANSDERM-SCOP) 1 MG/3DAYS Place 1 patch (1.5 mg total) every 3 (three) days onto the skin. 10 patch 2  . triamcinolone cream (KENALOG) 0.1 % Apply topically.    Loura Pardon Salicylate (ASPERCREME EX) Apply 1 application topically.    Marland Kitchen VITAMIN K PO Take 1 capsule by mouth daily.     No current facility-administered medications for this visit.    Facility-Administered Medications Ordered in Other Visits  Medication Dose Route Frequency Provider Last Rate Last Dose  . alteplase (CATHFLO ACTIVASE) injection 2 mg  2 mg Intracatheter Once PRN Truitt Merle, MD      . heparin lock flush 100 unit/mL  250 Units Intracatheter Once PRN Truitt Merle, MD      . ondansetron [facility]) injection 8 mg  8 mg Intravenous Once Truitt Merle, MD      . sodium chloride flush (NS) 0.9 % injection 10 mL  10 mL Intracatheter PRN Truitt Merle, MD   10 mL at 09/09/16 1645  . sodium chloride flush (NS) 0.9 % injection 10 mL  10 mL Intravenous PRN Truitt Merle, MD   10 mL at 02/11/17 1515  . sodium chloride flush (NS) 0.9 % injection 10 mL  10 mL Intracatheter PRN Truitt Merle, MD   10 mL at 05/18/17 1510    SURGICAL HISTORY:  Past Surgical History:  Procedure Laterality Date  . IR GENERIC HISTORICAL  08/03/2016   IR US GUIDE VASC ACCESS RIGHT 08/03/2016 Corrie Mckusick, DO [facility]INTER[facility]  . IR GENERIC HISTORICAL  08/03/2016   IR FLUORO GUIDE PORT INSERTION RIGHT 08/03/2016 Corrie Mckusick, DO [facility]  . MYOMECTOMY  2002  . UPPER ENDOSCOPIC ULTRASOUND (EUS) RADIAL N/A 07/07/2016   Performed by [name], MD at [place]:   Review of Systems  Constitutional: Negative for chills, fatigue, fever. Positive for weight loss and decreased  appetite. HENT:   Negative for mouth sores, nosebleeds, sore throat and trouble swallowing.  Positive for taste bud  changes and a white coating to her tongue. Eyes: Negative for eye problems and icterus.  Respiratory: Negative for cough, hemoptysis, shortness of breath and wheezing.   Cardiovascular: Negative for chest pain and leg swelling.  Gastrointestinal: Negative for diarrhea. Positive for abdominal pain, constipation, nausea, and vomiting when she takes hydrocodone. Genitourinary: Negative for bladder incontinence, difficulty urinating, dysuria, frequency and hematuria.   Musculoskeletal: Negative for back pain, gait problem, neck pain and neck stiffness.  Skin: Negative for itching and rash.  Neurological: Negative for dizziness, extremity weakness, gait problem, headaches, light-headedness and seizures.  Hematological: Negative for adenopathy. Does not bruise/bleed easily.  Psychiatric/Behavioral: Negative for confusion, depression and sleep disturbance. The patient is not nervous/anxious.     PHYSICAL EXAMINATION:  Blood pressure (!) 162/124, pulse (!) 131, temperature 99 F (37.2 C), temperature source Oral, resp. rate 18, height 5\' 6"  (1.676 m), weight 118 lb 6.4 oz (53.7 kg), SpO2 100 %. Repeat blood pressure and pulse after IV fluids were BP 146/78 and pulse 98  ECOG PERFORMANCE STATUS: 2 - Symptomatic, <50% confined to bed  Physical Exam  Constitutional: Oriented to person, place, and time.  Patient is in a wheelchair and writhing in pain.  HENT:  Head: Normocephalic and atraumatic.  Mouth/Throat: No oropharyngeal exudate. Tongue with a white coating consistent with thrush. Eyes: Conjunctivae are normal. Right eye exhibits no discharge. Left eye exhibits no discharge. No scleral icterus.  Neck: Normal range of motion. Neck supple.  Cardiovascular: Regular rhythm, normal heart sounds and intact distal pulses.  Tachycardia and elevated blood pressure likely due to pain.   Pulmonary/Chest: Effort normal and breath sounds normal. No respiratory distress. No wheezes. No rales.  Abdominal: Soft. Bowel sounds are normal. Exhibits no distension and no mass.  Abdomen is tender with light palpation.  Musculoskeletal: Normal range of motion. Exhibits no edema.  Lymphadenopathy:    No cervical adenopathy.  Neurological: Alert and oriented to person, place, and time. Exhibits normal muscle tone. Gait normal. Coordination normal.  Skin: Skin is warm and dry. No rash noted. Not diaphoretic. No erythema. No pallor.  Psychiatric: Mood, memory and judgment normal.  Vitals reviewed.  LABORATORY DATA: Lab Results  Component Value Date   WBC 2.7 (L) 07/18/2017   HGB 12.8 07/18/2017   HCT 38.2 07/18/2017   MCV 84.9 07/18/2017   PLT 232 07/18/2017      Chemistry      Component Value Date/Time   NA 137 07/13/2017 1027   K 3.9 07/13/2017 1027   CL 100 (L) 05/17/2017 0335   CO2 21 (L) 07/13/2017 1027   BUN 11.5 07/13/2017 1027   CREATININE 0.7 07/13/2017 1027      Component Value Date/Time   CALCIUM 9.3 07/13/2017 1027   ALKPHOS 35 (L) 07/13/2017 1027   AST 21 07/13/2017 1027   ALT 15 07/13/2017 1027   BILITOT 0.69 07/13/2017 1027       RADIOGRAPHIC STUDIES:  No results found.   ASSESSMENT/PLAN:   51 y.o.African-American female, without significant past medical history, presented with abdominal pain  1. Metastatic intrahepatic cholangiocarcinoma, with probable node and lung metastasis, cT2bN1M1, stage IV 2. Abdominal pain, back pain, worsening  3. Nausea and anorexia and weight loss  4. Neutropenia and anemia -improving 5. Constipation 6. Goals of care discussion 7. Oral thrush  8. Peripheral neuropathy, grade 1  Ms. Lonigro presents today with weight loss, nausea, vomiting, thrush, and poorly controlled abdominal pain. She reports that she is taking OxyContin 20 mg  every 12 hours and using hydrocodone 1 tablet every 4 hours for breakthrough  pain.  She reports that she has not been using the oxycodone that is on her medication list and is currently out of this.  Hydrocodone is causing her to have nausea and vomiting.  She reports that she tolerates the oxycodone better without nausea and vomiting.  Discussed continuing OxyContin 20 mg every 12 hours.  She was instructed to discontinue taking hydrocodone and to begin oxycodone 5 mg tablets 1-2 every 4 hours as needed for pain.  A new prescription was provided today.  She was instructed to keep track of how many oxycodone she is taking per day.  We may need to titrate up her OxyContin dose based on this.  She may continue to use Zofran as needed for nausea. Hopefully better pain management will prevent nausea and she will gain some weight.  She was instructed to continue Marinol 5 mg twice a day.  A prescription for fluconazole 100 mg daily for 10 days was sent to her local pharmacy for treatment of oral candidiasis.  She has stable grade 1 peripheral neuropathy to her feet, likely secondary to chemotherapy.   Due to her pain here in the office today, she was given morphine 4 mg IV x2 doses.  She was also given IV fluids for hydration and a dose of Zofran 8 mg with dexamethasone 10 mg IV for nausea.  The patient has stated that she does not want her chemotherapy as scheduled today.  This will be [name].  We have explained to the patient that she needs to schedule her CT scan so that we can restage her disease.  PLAN: -1 L of normal saline here in the office today. -Morphine 4 mg IV -Zofran 8 mg with dexamethasone 10 mg IV x1 dose -continue Oxycontin q12, take with food. -Prescription given for oxycodone 5 mg tablets 1-2 tablets every 4 hours as needed for pain.  Discontinue Norco. -Continue Zofran as needed for nausea. -Fluconazole 100 mg daily for 10 days for oral candidiasis. -Hold chemotherapy per patient request today. -CT scan to be done prior to her visit next week. -Keep follow-up visit  on 07/25/2017 for repeat labs and evaluation and for possible chemotherapy.  Her CT scan results will be reviewed at that visit.  This was a shared visit with Dr. Burr Medico. All questions were answered. The patient knows to call the clinic with any problems, questions or concerns. No barriers to learning was detected.  No orders of the defined types were placed in this encounter.  Mikey Bussing, DNP, AGPCNP-BC, AOCNP 07/18/17

## 2017-07-19 DIAGNOSIS — B370 Candidal stomatitis: Secondary | ICD-10-CM | POA: Insufficient documentation

## 2017-07-19 DIAGNOSIS — G893 Neoplasm related pain (acute) (chronic): Secondary | ICD-10-CM | POA: Insufficient documentation

## 2017-07-19 LAB — CANCER ANTIGEN 19-9: CA 19-9: 161 U/mL — ABNORMAL HIGH (ref 0–35)

## 2017-07-20 ENCOUNTER — Encounter (HOSPITAL_COMMUNITY): Payer: Self-pay

## 2017-07-20 ENCOUNTER — Ambulatory Visit (HOSPITAL_COMMUNITY)
Admission: RE | Admit: 2017-07-20 | Discharge: 2017-07-20 | Disposition: A | Discharge status: Home / Self Care | Payer: Federal, State, Local not specified - PPO | Source: Ambulatory Visit | Attending: Medical | Admitting: Medical

## 2017-07-20 ENCOUNTER — Other Ambulatory Visit: Payer: Self-pay | Admitting: *Deleted

## 2017-07-20 DIAGNOSIS — C7800 Secondary malignant neoplasm of unspecified lung: Secondary | ICD-10-CM | POA: Diagnosis present

## 2017-07-20 DIAGNOSIS — R1909 Other intra-abdominal and pelvic swelling, mass and lump: Secondary | ICD-10-CM | POA: Insufficient documentation

## 2017-07-20 DIAGNOSIS — I2510 Atherosclerotic heart disease of native coronary artery without angina pectoris: Secondary | ICD-10-CM | POA: Diagnosis not present

## 2017-07-20 DIAGNOSIS — R110 Nausea: Secondary | ICD-10-CM

## 2017-07-20 DIAGNOSIS — C221 Intrahepatic bile duct carcinoma: Secondary | ICD-10-CM | POA: Insufficient documentation

## 2017-07-20 DIAGNOSIS — R630 Anorexia: Secondary | ICD-10-CM

## 2017-07-20 DIAGNOSIS — R918 Other nonspecific abnormal finding of lung field: Secondary | ICD-10-CM | POA: Insufficient documentation

## 2017-07-20 MED ORDER — HEPARIN SOD (PORK) LOCK FLUSH 100 UNIT/ML IV SOLN: 500.0000 [IU] | Freq: Once | INTRAVENOUS | Status: DC

## 2017-07-20 MED ORDER — IOPAMIDOL (ISOVUE-300) INJECTION 61%
INTRAVENOUS | Status: AC
  Filled 2017-07-20: qty 100

## 2017-07-20 MED ORDER — HEPARIN SOD (PORK) LOCK FLUSH 100 UNIT/ML IV SOLN
INTRAVENOUS | Status: AC
  Administered 2017-07-20: 500 [IU]
  Filled 2017-07-20: qty 5

## 2017-07-20 MED ORDER — OLANZAPINE 2.5 MG PO TABS: 2.5000 mg | ORAL_TABLET | Freq: Every day | ORAL | 1 refills | Status: DC

## 2017-07-20 MED ORDER — IOPAMIDOL (ISOVUE-300) INJECTION 61%
100.0000 mL | Freq: Once | INTRAVENOUS | Status: AC | PRN
Start: 2017-07-20 — End: 2017-07-20
  Administered 2017-07-20: 100 mL via INTRAVENOUS

## 2017-07-22 ENCOUNTER — Telehealth: Payer: Self-pay | Admitting: *Deleted

## 2017-07-22 ENCOUNTER — Other Ambulatory Visit: Payer: Self-pay | Admitting: *Deleted

## 2017-07-22 DIAGNOSIS — C221 Intrahepatic bile duct carcinoma: Secondary | ICD-10-CM

## 2017-07-22 DIAGNOSIS — C7801 Secondary malignant neoplasm of right lung: Principal | ICD-10-CM

## 2017-07-22 MED ORDER — OMEPRAZOLE 40 MG PO CPDR: 40.0000 mg | DELAYED_RELEASE_CAPSULE | Freq: Two times a day (BID) | ORAL | 1 refills | Status: DC

## 2017-07-22 NOTE — Telephone Encounter (Signed)
Pt called requesting refill of Omeprazole.  Noted pt was given  Zyprexa on  07/20/17.  Consulted with Theadora Rama, PharmD. No interactions noted per pharmacist.  Script escribed to [facility] pharmacy per pt's request.

## 2017-07-25 ENCOUNTER — Ambulatory Visit (HOSPITAL_BASED_OUTPATIENT_CLINIC_OR_DEPARTMENT_OTHER): Payer: Federal, State, Local not specified - PPO | Admitting: Nurse Practitioner

## 2017-07-25 ENCOUNTER — Other Ambulatory Visit: Payer: Self-pay | Admitting: Internal Medicine

## 2017-07-25 ENCOUNTER — Telehealth: Payer: Self-pay

## 2017-07-25 ENCOUNTER — Other Ambulatory Visit: Payer: Self-pay | Admitting: Hematology

## 2017-07-25 ENCOUNTER — Encounter: Payer: Self-pay | Admitting: Nurse Practitioner

## 2017-07-25 ENCOUNTER — Ambulatory Visit (HOSPITAL_BASED_OUTPATIENT_CLINIC_OR_DEPARTMENT_OTHER): Payer: Federal, State, Local not specified - PPO

## 2017-07-25 ENCOUNTER — Other Ambulatory Visit (HOSPITAL_BASED_OUTPATIENT_CLINIC_OR_DEPARTMENT_OTHER): Payer: Federal, State, Local not specified - PPO

## 2017-07-25 VITALS — BP 119/79 | HR 75 | Temp 97.8°F | Resp 18

## 2017-07-25 DIAGNOSIS — C7801 Secondary malignant neoplasm of right lung: Principal | ICD-10-CM

## 2017-07-25 DIAGNOSIS — C221 Intrahepatic bile duct carcinoma: Secondary | ICD-10-CM | POA: Diagnosis not present

## 2017-07-25 DIAGNOSIS — K5900 Constipation, unspecified: Secondary | ICD-10-CM

## 2017-07-25 DIAGNOSIS — C786 Secondary malignant neoplasm of retroperitoneum and peritoneum: Secondary | ICD-10-CM

## 2017-07-25 DIAGNOSIS — Z7189 Other specified counseling: Secondary | ICD-10-CM

## 2017-07-25 DIAGNOSIS — R110 Nausea: Secondary | ICD-10-CM | POA: Diagnosis not present

## 2017-07-25 DIAGNOSIS — N838 Other noninflammatory disorders of ovary, fallopian tube and broad ligament: Secondary | ICD-10-CM

## 2017-07-25 DIAGNOSIS — R112 Nausea with vomiting, unspecified: Secondary | ICD-10-CM

## 2017-07-25 DIAGNOSIS — N839 Noninflammatory disorder of ovary, fallopian tube and broad ligament, unspecified: Secondary | ICD-10-CM

## 2017-07-25 DIAGNOSIS — B379 Candidiasis, unspecified: Secondary | ICD-10-CM | POA: Diagnosis not present

## 2017-07-25 DIAGNOSIS — R911 Solitary pulmonary nodule: Secondary | ICD-10-CM | POA: Diagnosis not present

## 2017-07-25 DIAGNOSIS — C7800 Secondary malignant neoplasm of unspecified lung: Principal | ICD-10-CM

## 2017-07-25 LAB — CBC WITH DIFFERENTIAL/PLATELET
BASO%: 0.3 % (ref 0.0–2.0)
Basophils Absolute: 0 10*3/uL (ref 0.0–0.1)
EOS%: 1.3 % (ref 0.0–7.0)
Eosinophils Absolute: 0.1 10*3/uL (ref 0.0–0.5)
HCT: 34 % — ABNORMAL LOW (ref 34.8–46.6)
HGB: 11.2 g/dL — ABNORMAL LOW (ref 11.6–15.9)
LYMPH%: 39.3 % (ref 14.0–49.7)
MCH: 28.6 pg (ref 25.1–34.0)
MCHC: 32.9 g/dL (ref 31.5–36.0)
MCV: 87 fL (ref 79.5–101.0)
MONO#: 0.3 10*3/uL (ref 0.1–0.9)
MONO%: 6.8 % (ref 0.0–14.0)
NEUT#: 2 10*3/uL (ref 1.5–6.5)
NEUT%: 52.3 % (ref 38.4–76.8)
Platelets: 173 10*3/uL (ref 145–400)
RBC: 3.91 10*6/uL (ref 3.70–5.45)
RDW: 13.9 % (ref 11.2–14.5)
WBC: 3.8 10*3/uL — ABNORMAL LOW (ref 3.9–10.3)
lymph#: 1.5 10*3/uL (ref 0.9–3.3)
nRBC: 0 % (ref 0–0)

## 2017-07-25 LAB — COMPREHENSIVE METABOLIC PANEL
ALT: 14 U/L (ref 0–55)
AST: 22 U/L (ref 5–34)
Albumin: 4.1 g/dL (ref 3.5–5.0)
Alkaline Phosphatase: 31 U/L — ABNORMAL LOW (ref 40–150)
Anion Gap: 11 mEq/L (ref 3–11)
BUN: 9.3 mg/dL (ref 7.0–26.0)
CO2: 29 mEq/L (ref 22–29)
Calcium: 9.2 mg/dL (ref 8.4–10.4)
Chloride: 97 mEq/L — ABNORMAL LOW (ref 98–109)
Creatinine: 0.8 mg/dL (ref 0.6–1.1)
EGFR: >60 mL/min/{1.73_m2} (ref >60)
Glucose: 89 mg/dl (ref 70–140)
Potassium: 3.5 mEq/L (ref 3.5–5.1)
Sodium: 137 mEq/L (ref 136–145)
Total Bilirubin: 0.33 mg/dL (ref 0.20–1.20)
Total Protein: 6.7 g/dL (ref 6.4–8.3)

## 2017-07-25 MED ORDER — SODIUM CHLORIDE 0.9% FLUSH
10.0000 mL | INTRAVENOUS | Status: DC | PRN
  Administered 2017-07-25: 10 mL
  Filled 2017-07-25: qty 10

## 2017-07-25 MED ORDER — ONDANSETRON HCL 8 MG PO TABS: 8.0000 mg | ORAL_TABLET | Freq: Three times a day (TID) | ORAL | 3 refills | Status: DC | PRN

## 2017-07-25 MED ORDER — SODIUM CHLORIDE 0.9 % IV SOLN
Freq: Once | INTRAVENOUS | Status: AC
  Administered 2017-07-25: 12:00:00 via INTRAVENOUS

## 2017-07-25 MED ORDER — HEPARIN SOD (PORK) LOCK FLUSH 100 UNIT/ML IV SOLN
500.0000 [IU] | Freq: Once | INTRAVENOUS | Status: AC | PRN
  Administered 2017-07-25: 500 [IU]
  Filled 2017-07-25: qty 5

## 2017-07-25 NOTE — Telephone Encounter (Signed)
Printed patient avs and calender for upcoming appointments per 11/26 los

## 2017-07-25 NOTE — Patient Instructions (Signed)
Implanted Port Home Guide An implanted port is a type of central line that is placed under the skin. Central lines are used to provide IV access when treatment or nutrition needs to be given through a person's veins. Implanted ports are used for long-term IV access. An implanted port may be placed because:  You need IV medicine that would be irritating to the small veins in your hands or arms.  You need long-term IV medicines, such as antibiotics.  You need IV nutrition for a long period.  You need frequent blood draws for lab tests.  You need dialysis.  Implanted ports are usually placed in the chest area, but they can also be placed in the upper arm, the abdomen, or the leg. An implanted port has two main parts:  Reservoir. The reservoir is round and will appear as a small, raised area under your skin. The reservoir is the part where a needle is inserted to give medicines or draw blood.  Catheter. The catheter is a thin, flexible tube that extends from the reservoir. The catheter is placed into a large vein. Medicine that is inserted into the reservoir goes into the catheter and then into the vein.  How will I care for my incision site? Do not get the incision site wet. Bathe or shower as directed by your health care provider. How is my port accessed? Special steps must be taken to access the port:  Before the port is accessed, a numbing cream can be placed on the skin. This helps numb the skin over the port site.  Your health care provider uses a sterile technique to access the port. ? Your health care provider must put on a mask and sterile gloves. ? The skin over your port is cleaned carefully with an antiseptic and allowed to dry. ? The port is gently pinched between sterile gloves, and a needle is inserted into the port.  Only "non-coring" port needles should be used to access the port. Once the port is accessed, a blood return should be checked. This helps ensure that the port  is in the vein and is not clogged.  If your port needs to remain accessed for a constant infusion, a clear (transparent) bandage will be placed over the needle site. The bandage and needle will need to be changed every week, or as directed by your health care provider.  Keep the bandage covering the needle clean and dry. Do not get it wet. Follow your health care provider's instructions on how to take a shower or bath while the port is accessed.  If your port does not need to stay accessed, no bandage is needed over the port.  What is flushing? Flushing helps keep the port from getting clogged. Follow your health care provider's instructions on how and when to flush the port. Ports are usually flushed with saline solution or a medicine called heparin. The need for flushing will depend on how the port is used.  If the port is used for intermittent medicines or blood draws, the port will need to be flushed: ? After medicines have been given. ? After blood has been drawn. ? As part of routine maintenance.  If a constant infusion is running, the port may not need to be flushed.  How long will my port stay implanted? The port can stay in for as long as your health care provider thinks it is needed. When it is time for the port to come out, surgery will be   done to remove it. The procedure is similar to the one performed when the port was put in. When should I seek immediate medical care? When you have an implanted port, you should seek immediate medical care if:  You notice a bad smell coming from the incision site.  You have swelling, redness, or drainage at the incision site.  You have more swelling or pain at the port site or the surrounding area.  You have a fever that is not controlled with medicine.  This information is not intended to replace advice given to you by your health care provider. Make sure you discuss any questions you have with your health care provider. Document  Released: 08/16/2005 Document Revised: 01/22/2016 Document Reviewed: 04/23/2013 Elsevier Interactive Patient Education  2017 Else[employer] Inc.  

## 2017-07-25 NOTE — Progress Notes (Addendum)
Nancy [name]  [phone] [phone]  Clinic Follow up Note   Patient Care Team: Carollee Herter, Alferd Apa, DO as PCP - General Pyrtle, Lajuan Lines, MD as Consulting Physician (Gastroenterology) 07/25/2017  CHIEF COMPLAINT: F/u metastatic cholangiocarcinoma    SUMMARY OF ONCOLOGIC HISTORY: Oncology History   Metastatic cholangiocarcinoma to lung [facility])   Staging form: Perihilar Bile Ducts, AJCC 7th Edition   - Clinical stage from 07/07/2016: Stage IVB (T2b, N1, M1) - Signed by Truitt Merle, MD on 07/28/2016      Metastatic cholangiocarcinoma to lung ([place])   05/22/2014 Procedure    Routine screening colonoscopy showed a sessile polyp measuring 4 mm in the sigmoid colon, removed, otherwise negative.      06/29/2016 Imaging    CT chest, abdomen and pelvis showed 2 indeterminant nodule in left and the right lung, 4-109mm, indeterminate a hypovascular liver lesions largest in the caudate lobe measuring 2.6 cm, mild abdominal lymphadenopathy in the gastrohepatic ligament and portocaval space.      07/02/2016 Imaging    Abdominal MRI with and without contrast showed 2 lobular lesions in the caudate lobe, and is regular lymph nodes in the gastrohepatic ligament which is adjacent to the liver lesion, suspicious for malignancy. 2 small lesions in the right hepatic lobe are indeterminate, but concerning for metastasis.      07/07/2016 Procedure    EGD was negative, EUS biopsy of the liver and adjacent lymph node      07/07/2016 Initial Diagnosis    Metastatic cholangiocarcinoma to lung ([place])      07/07/2016 Initial Biopsy    Fine-needle aspiration of the liver lesion in caudate lobe and adjacent lymph nodes both showed metastatic adenocarcinoma.      08/11/2016 - 04/25/2017 Chemotherapy    Cisplatin 25 mg/m, gemcitabine 1000 mg/m, on day one and 8, every 21 days   Cisplatin and gemcitabine on Day 1, 8 every 21 days, with neulasta on day 9, Gemcitabine dosed  reduced to 800 mg/m due to cytopenias, increased to 900mg /m2 on 12/06/2016. Changes to gemcitabine maintenance therapy on day 1 and 8 every 21 days, from 04/18/2017, gemcitabine was reduced dose to 400 mg/m on 04/25/17       10/18/2016 Imaging    CT CAP  IMPRESSION: 1. Again noted are multifocal liver metastasis. Overall there has been no significant interval change in overall volume of liver metastases. 2. Similar appearance of upper abdominal adenopathy 3. Slight decrease in size of small pulmonary metastases.      01/25/2017 Imaging    CT CAP W Contrast 01/25/17 IMPRESSION: 1. Response to therapy. 2. Similar to minimal decrease in pulmonary nodules/metastasis. 3. Improvement in hepatic disease burden. 4. Decrease in abdominal adenopathy. 5. Significantly age advanced atherosclerosis, including within the coronary arteries. 6. Uterine fibroids. 7. Suspect a complex cyst in the left ovary. Recommend attention on follow-up.       04/13/2017 Imaging    CT CAP W Contrast IMPRESSION: 1. The dominant caudate lobe mass is stable in size and appearance, as is the indistinct adenopathy in the porta hepatis adjacent to the caudate lobe, and in the retroperitoneum. Several subtle additional liver lesions are slightly less conspicuous on today' s exam. The pulmonary nodules are stable, and mild omental nodularity in the left lower quadrant is very slightly worsened compared to prior. Overall the burden of malignancy appears similar to prior, and some of the findings may represent effectively treated metastatic lesions. 2. Complex left adnexal mass, 6.7  by 4.1 cm, enlarging. Pelvic sonography recommended for further characterization. 3. Other imaging findings of potential clinical significance: Aortic Atherosclerosis (ICD10-I70.0). Coronary atherosclerosis. Prominent stool throughout the colon favors constipation. Fibroid uterus.      05/16/2017 -  Chemotherapy    Second-line chemo  FOLFOX every 2 weeks with neulasta after pump DC      07/20/2017 Imaging    IMPRESSION: CT CHEST  1. Interval development of 2 small new pulmonary nodules concerning for progressive, or new metastatic disease. Recommend continued attention on follow-up imaging. 2. The previously noted right upper and left upper lobe pulmonary nodules remain stable in size consistent with treated metastatic disease. 3. Stable position of right IJ approach power injectable port catheter. 4. Coronary artery calcifications. CT ABD/PELVIS  1. Marked interval enlargement of a complex, enhancing, cystic and solid left ovarian mass which now fills the posterior aspect of the pelvis exerting mass effect on the adjacent rectosigmoid colon and bladder. This likely accounts for the patient's lower abdominal pain and sensation of constipation (there is no significant stool burden within the rectum or sigmoid colon). These findings are highly suspicious for a primary ovarian cystic or mucinous adenocarcinoma. 2. Enlarging omental implants in the left lower quadrant highly concerning for metastatic ovarian carcinoma. 3. New/enlarging subcapsular hepatic lesion may represent progressive cholangio or ovarian carcinoma metastases. 4. Enlarging caudate lobe lesion and aortocaval/portacaval confluent necrotic adenopathy concerning for progressive metastatic cholangiocarcinoma. 5. The segment 6 hepatic lesion remains stable or may be smaller on today's examination. These results will be called to the ordering clinician or representative by the Radiologist Assistant, and communication documented in the PACS or zVision Dashboard.       CURRENT THERAPY: Changed chemo to Second-line chemo FOLFOX every 2 weeks with neulasta starting 05/16/17; Oxali held with cycle 2 and 3 due to delayed nausea, vomiting, weight loss, and abdominal pain.   INTERVAL HISTORY: Nancy [name] returns with family for f/u as  scheduled prior to next cycle chemotherapy. She was last treated with 5FU alone on 06/27/17. She was seen in symptom management clinic on 07/13/17 for abdominal pain, n/v, and fatigue. A CT CAP was ordered at that time. Chemo was held on 07/18/17 per patient request, pain and nausea medication were adjusted at that time and she received IVF. Today she reports slight improvement in nausea with addition of scopalomine patch. She has an appetite and is able to eat but has decreased taste. Uses carnation breakfast, dilutes with water. Occasionally vomits approx 1 hour after meals. She is asking for zofran refill. Epigastric pain is well controlled today, 3/10; taking oxycontin 20 mg q12 and oxycodone 5 mg twice daily. She uses heat for supplementation. Last BM 1 day ago, which was her 1st in 4 days; required senokot s and milk of magnesia. Occasional urge to defecate and urinate but then won't produce urine or stool. Developed muscle twitching or jerking over last week, especially right before falling asleep.     REVIEW OF SYSTEMS:   Constitutional: Denies fevers, chills or abnormal weight loss (+) 1 lbs weight gain (+) decreased taste (+) appetite present   Eyes: Denies blurriness of vision Ears, nose, mouth, throat, and face: Denies mucositis or sore throat (+) white coating to tongue, on diflucan for thrush Respiratory: Denies cough, dyspnea or wheezes Cardiovascular: Denies palpitation, chest discomfort or lower extremity swelling Gastrointestinal:  Denies diarrhea, bloating, distention, heartburn or change in bowel habits (+) intermittent nausea, improved with scopalomine and zofran (+) constipation, BM 07/24/17  was first in 4 days (+) occasional urge to defecate without subsequent BM GU: (+) occasional urge to void without subsequent urination Skin: Denies abnormal skin rashes (+) discoloration to abdomen, attributes to heating pad  Lymphatics: Denies new lymphadenopathy or easy  bruising Neurological:Denies numbness, tingling or new weaknesses (+) generalized twitching/jerking before sleep Behavioral/Psych: Mood is stable, no new changes  All other systems were reviewed with the patient and are negative.  MEDICAL HISTORY:  Past Medical History:  Diagnosis Date  . Allergy    SEASONAL  . Anemia    [employer]  . Atopic eczema   . cholangio ca dx'd 07/12/16  . Fibrocystic breast disease   . GERD (gastroesophageal reflux disease)   . Hx of migraines    seasonal   . Pneumonia    6 years ago   . TIA (transient ischemic attack)     SURGICAL HISTORY: Past Surgical History:  Procedure Laterality Date  . EUS N/A 07/07/2016   Procedure: UPPER ENDOSCOPIC ULTRASOUND (EUS) RADIAL;  Surgeon: [name], MD;  Location: [facility] ENDOS[facility];  Service: Endoscopy;  Laterality: N/A;  . IR GENERIC HISTORICAL  08/03/2016   IR US GUIDE VASC ACCESS RIGHT 08/03/2016 Corrie Mckusick, DO [facility]  . IR GENERIC HISTORICAL  08/03/2016   IR FLUORO GUIDE PORT INSERTION RIGHT 08/03/2016 Corrie Mckusick, DO [facility]  . MYOMECTOMY  2002    I have reviewed the social history and family history with the patient and they are unchanged from previous note.  ALLERGIES:  is allergic to penicillins and lipitor [atorvastatin].  MEDICATIONS:  Current Outpatient Medications  Medication Sig Dispense Refill  . ALPRAZolam (XANAX) 0.5 MG tablet Take 1 tablet (0.5 mg total) by mouth as needed for anxiety. (Patient not taking: Reported on 07/13/2017) 10 tablet 0  . aspirin 81 MG tablet Take 81 mg by mouth daily.    . cetirizine (ZYRTEC) 10 MG tablet Take 10 mg by mouth daily as needed. For allergies     . Cholecalciferol (VITAMIN D3) 5000 UNITS CAPS Take 1 capsule by mouth daily.    . COLOSTRUM PO Take 1.25 g by mouth 2 (two) times daily. 1.25 grams twice daily    . dicyclomine (BENTYL) 20 MG tablet 3 (three) times daily as needed.     . dronabinol (MARINOL) 5 MG capsule Take 1 capsule (5 mg  total) by mouth 2 (two) times daily before a meal. 60 capsule 1  . fluconazole (DIFLUCAN) 100 MG tablet Take 1 tablet (100 mg total) daily by mouth. 10 tablet 0  . HYDROcodone-acetaminophen (NORCO) 5-325 MG tablet Take 1-2 tablets every 4 (four) hours as needed by mouth for severe pain. (Patient not taking: Reported on 07/18/2017) 120 tablet 0  . lidocaine-prilocaine (EMLA) cream Apply 1 application topically as needed. Apply to portacath 1 1/2 hours - 2 hours prior to procedures as needed. 30 g 1  . LORazepam (ATIVAN) 0.5 MG tablet Place under tongue and dissolve every 8 hours as needed for nausea. Do not take Xanax while taking this medication. 30 tablet 0  . magnesium hydroxide (MILK OF MAGNESIA) 400 MG/5ML suspension Take 30 mLs by mouth daily as needed for mild constipation.    . Menaquinone-7 (VITAMIN K2) 40 MCG TABS Take by mouth.    . mometasone (ELOCON) 0.1 % lotion Apply topically daily. 60 mL 0  . OLANZapine (ZYPREXA) 2.5 MG tablet Take 1 tablet (2.5 mg total) by mouth at bedtime. 30 tablet 1  . omeprazole (PRILOSEC)  40 MG capsule Take 1 capsule (40 mg total) by mouth 2 (two) times daily before a meal. 60 capsule 1  . omeprazole (PRILOSEC) 40 MG capsule TAKE 1 CAPSULE (40 MG TOTAL) BY MOUTH 2 (TWO) TIMES DAILY BEFORE A MEAL. 60 capsule 0  . ondansetron (ZOFRAN) 8 MG tablet Take 1 tablet (8 mg total) by mouth every 8 (eight) hours as needed for nausea or vomiting. 30 tablet 3  . oxyCODONE (OXY IR/ROXICODONE) 5 MG immediate release tablet Take 1-2 tablets (5-10 mg total) every 4 (four) hours as needed by mouth for severe pain. 60 tablet 0  . oxyCODONE (OXYCONTIN) 20 mg 12 hr tablet Take 1 tablet (20 mg total) every 12 (twelve) hours by mouth. 60 tablet 0  . Prenatal Vit-Fe Fumarate-FA (PRENATAL MULTIVITAMIN) TABS tablet Take 2 tablets by mouth daily at 12 noon.    . promethazine (PHENERGAN) 25 MG tablet Take 1 tablet (25 mg total) by mouth every 6 (six) hours as needed for nausea or vomiting.  40 tablet 0  . scopolamine (TRANSDERM-SCOP) 1 MG/3DAYS Place 1 patch (1.5 mg total) every 3 (three) days onto the skin. 10 patch 2  . triamcinolone cream (KENALOG) 0.1 % Apply topically.    Nancy [name] (ASPERCREME EX) Apply 1 application topically.    Marland Kitchen VITAMIN K PO Take 1 capsule by mouth daily.     No current facility-administered medications for this visit.    Facility-Administered Medications Ordered in Other Visits  Medication Dose Route Frequency Provider Last Rate Last Dose  . alteplase (CATHFLO ACTIVASE) injection 2 mg  2 mg Intracatheter Once PRN Truitt Merle, MD      . heparin lock flush 100 unit/mL  250 Units Intracatheter Once PRN Truitt Merle, MD      . ondansetron [facility]) injection 8 mg  8 mg Intravenous Once Truitt Merle, MD      . sodium chloride flush (NS) 0.9 % injection 10 mL  10 mL Intracatheter PRN Truitt Merle, MD   10 mL at 09/09/16 1645  . sodium chloride flush (NS) 0.9 % injection 10 mL  10 mL Intravenous PRN Truitt Merle, MD   10 mL at 02/11/17 1515  . sodium chloride flush (NS) 0.9 % injection 10 mL  10 mL Intracatheter PRN Truitt Merle, MD   10 mL at 05/18/17 1510  . sodium chloride flush (NS) 0.9 % injection 10 mL  10 mL Intracatheter PRN Truitt Merle, MD   10 mL at 07/18/17 1442    PHYSICAL EXAMINATION: ECOG PERFORMANCE STATUS: 3 - Symptomatic, >50% confined to bed  Vitals:   07/25/17 1109  BP: 130/80  Pulse: 80  Resp: 20  Temp: 98.6 F (37 C)  SpO2: 100%   Filed Weights   07/25/17 1109  Weight: 119 lb 6.4 oz (54.2 kg)    GENERAL:alert, no distress and standing but bent over supported on table  SKIN: skin color, texture, turgor are normal, no rashes or significant lesions EYES: normal, Conjunctiva are pink and non-injected, sclera clear OROPHARYNX:no exudate, no erythema and lips and buccal mucosa are normal; tongue with white coating  NECK: supple, thyroid normal size, non-tender, without nodularity LYMPH:  no palpable cervical, supraclavicular,  axillary, or inguinal lymphadenopathy  LUNGS: clear to auscultation bilaterally with normal breathing effort HEART: regular rate & rhythm and no murmurs and no lower extremity edema ABDOMEN:abdomen soft, flat with scattered hyperpigmented skin on abdomen. Hypoactive bowel sounds. Tenderness to epigastrium and LLQ  Musculoskeletal:no cyanosis of digits and no  clubbing  NEURO: alert & oriented x 3 with fluent speech, no focal motor/sensory deficits PAC without erythema  LABORATORY DATA:  I have reviewed the data as listed CBC Latest Ref Rng & Units 07/25/2017 07/18/2017 07/13/2017  WBC 3.9 - 10.3 10e3/uL 3.8(L) 2.7(L) 3.5(L)  Hemoglobin 11.6 - 15.9 g/dL 11.2(L) 12.8 11.5(L)  Hematocrit 34.8 - 46.6 % 34.0(L) 38.2 35.0  Platelets 145 - 400 10e3/uL 173 232 192     CMP Latest Ref Rng & Units 07/25/2017 07/18/2017 07/13/2017  Glucose 70 - 140 mg/dl 89 82 62(L)  BUN 7.0 - 26.0 mg/dL 9.3 11.2 11.5  Creatinine 0.6 - 1.1 mg/dL 0.8 0.9 0.7  Sodium 136 - 145 mEq/L 137 138 137  Potassium 3.5 - 5.1 mEq/L 3.5 3.8 3.9  Chloride 101 - 111 mmol/L - - -  CO2 22 - 29 mEq/L 29 18(L) 21(L)  Calcium 8.4 - 10.4 mg/dL 9.2 9.8 9.3  Total Protein 6.4 - 8.3 g/dL 6.7 7.8 7.2  Total Bilirubin 0.20 - 1.20 mg/dL 0.33 0.59 0.69  Alkaline Phos 40 - 150 U/L 31(L) 35(L) 35(L)  AST 5 - 34 U/L 22 20 21   ALT 0 - 55 U/L 14 14 15     RADIOGRAPHIC STUDIES: I have personally reviewed the radiological images as listed and agreed with the findings in the report. No results found.   07/20/17 CT CAP IMPRESSION: CT CHEST  1. Interval development of 2 small new pulmonary nodules concerning for progressive, or new metastatic disease. Recommend continued attention on follow-up imaging. 2. The previously noted right upper and left upper lobe pulmonary nodules remain stable in size consistent with treated metastatic disease. 3. Stable position of right IJ approach power injectable port catheter. 4. Coronary artery  calcifications. CT ABD/PELVIS  1. Marked interval enlargement of a complex, enhancing, cystic and solid left ovarian mass which now fills the posterior aspect of the pelvis exerting mass effect on the adjacent rectosigmoid colon and bladder. This likely accounts for the patient's lower abdominal pain and sensation of constipation (there is no significant stool burden within the rectum or sigmoid colon). These findings are highly suspicious for a primary ovarian cystic or mucinous adenocarcinoma. 2. Enlarging omental implants in the left lower quadrant highly concerning for metastatic ovarian carcinoma. 3. New/enlarging subcapsular hepatic lesion may represent progressive cholangio or ovarian carcinoma metastases. 4. Enlarging caudate lobe lesion and aortocaval/portacaval confluent necrotic adenopathy concerning for progressive metastatic cholangiocarcinoma. 5. The segment 6 hepatic lesion remains stable or may be smaller on today's examination. These results will be called to the ordering clinician or representative by the Radiologist Assistant, and communication documented in the PACS or zVision Dashboard.   ASSESSMENT & PLAN: 51 y.o.African-American female, without significant past medical history, presented with abdominal pain  1. Metastatic intrahepatic cholangiocarcinoma, with probable node and lung metastasis, cT2bN1M1, stage IV 2. Abdominal pain, back pain, worsening  3. Nausea and anorexia and weight loss  4. Neutropenia and anemia 5. Constipation 6. Goals of care discussion 7. Oral thrush  8. Peripheral neuropathy, grade 1 9. Complex cystic and solid left ovarian mass, omental implants   Ms. Kitch presents today with improved epigastric pain and nausea control. She takes 20 mg oxycontin q12 and oxycodone 5 mg twice daily. We reviewed she can take this more frequently, q4h, but she does not require increased frequency at this time. For nausea, she will continue  scopolamine patch, PPI, and zofran; I refilled zofran today. She has sensation of constipation, last BM 07/24/17 but  previous stool was 4 days prior. She will take senokot with or without miralax daily to maintain BM q1-2 days. She will take mag citrate if no BM in 4 days.   We reviewed her CT scan in detail, which shows interval development of 2 small new pulmonary nodules concerning for progressive metastatic disease. Previously noted pulmonary nodules and hepatic lesion remain stable.  There is marked interval enlargement of a complex, enhancing, cystic and solid left ovarian mass measuring 13 x 10 x 7.7 cm and exerts mass effect on the uterus, bladder, and rectosigmoid colon. Findings are highly suspicious for primary ovarian cystic or mucinous adenocarcinoma. Enlarging omental implants in LLQ highly concerning for metastatic ovarian carcinoma. In addition, new/enlarging subcapsular hepatic lesion may represent progressive cholangio or ovarian carcinoma metastases. Physical exam consistent with LLQ tenderness on palpation. We discussed this large mass can cause sensation of constipation and may lead to bowel obstruction. She will keep bowels moving regularly. Recommend IR biopsy of LLQ omental implant to confirm metastatic cholangiocarcinoma vs ovarian carcinoma, she agrees to proceed. Will discuss with our gyn onc team. Will f/u CA 125. If biopsy confirms metastatic cholangiocarcinoma, will discuss with Dr. Leamon Arnt at [facility] for consideration of other options or clinical trial.   VS and weight stable; CBC and Cmet unremarkable today. She has resolving oral thrush, will continue diflucan until complete. Hyperpigmentation to abdominal skin likely related to falling asleep with heating pad, she will use additional barrier to protect skin and avoid sleeping with the device. Muscle twitching and jerking I did not observe during the visit but may be related to opioid pain medication. Will monitor. Discontinue FOLFOX  due to disease progression; she received oxaliplatin with cycle 1 only, subsequent cycles with 5FU alone. Will get IVF today. Return next week for further discussion after biopsy results.   PLAN: Discontinue FOLFOX due to disease progression, IVF today only  Continue anti-emetic and pain regimen at current doses; refilled zofran today Senokot +/- miralax daily to maintain BM q1-2 days; mag citrate if no BM in 4 days F/u CA 125 IR biopsy to confirm metastatic ovarian vs cholangiocarcinoma  Discuss with Dr. Leamon Arnt at [facility] and [place] [facility] Return in 1 week to discuss biopsy results   This was a shared visit with Dr. Burr Medico. All questions were answered. The patient knows to call the clinic with any problems, questions or concerns. No barriers to learning was detected.     Alla Feeling, NP 07/25/17   Addendum  I have seen the patient, examined her. I agree with the assessment and and plan and have edited the notes.   Jadia's pain is better controlled now. I reviewed her CT scan findings, which unfortunately showed new peritoneal carcinomatosis with a large complex ovarian mass, primary ovarian cancer vs metastatic cholangiocarcinoma. I recommend IR biopsy of her peritoneal lesion, checked CA125 today. Likely will change her chemo based on biopsy result. Hold chemo today, will give iVF, continue supportive care.   Truitt Merle  07/25/2017

## 2017-07-25 NOTE — Progress Notes (Signed)
These preliminary result these preliminary results were noted.  Awaiting final report.

## 2017-07-26 ENCOUNTER — Telehealth: Payer: Self-pay | Admitting: *Deleted

## 2017-07-26 LAB — CA 125: Cancer Antigen (CA) 125: 100.6 U/mL — ABNORMAL HIGH (ref 0.0–38.1)

## 2017-07-26 NOTE — Telephone Encounter (Signed)
Hi Ulice Dash,  Could you or your partner do the biopsy earlier than 12/7? Thanks much!  Krista Blue

## 2017-07-26 NOTE — Telephone Encounter (Signed)
Pt called stating that she has her biopsy scheduled for 08/05/17 which was 1st available & wants to know if Dr Burr Medico thinks this is appropriate.  She has a flush/lab/MD appt on 08/04/17.  Informed that we will ask Dr Burr Medico & see if we need to move MD appt to after Bx.

## 2017-07-27 ENCOUNTER — Other Ambulatory Visit: Payer: Self-pay | Admitting: *Deleted

## 2017-07-27 DIAGNOSIS — C7800 Secondary malignant neoplasm of unspecified lung: Principal | ICD-10-CM

## 2017-07-27 DIAGNOSIS — C221 Intrahepatic bile duct carcinoma: Secondary | ICD-10-CM

## 2017-07-27 MED ORDER — LIDOCAINE-PRILOCAINE 2.5-2.5 % EX CREA: 1.0000 "application " | TOPICAL_CREAM | CUTANEOUS | 1 refills | Status: DC | PRN

## 2017-07-28 ENCOUNTER — Telehealth: Payer: Self-pay | Admitting: *Deleted

## 2017-07-28 ENCOUNTER — Telehealth: Payer: Self-pay | Admitting: Hematology

## 2017-07-28 MED ORDER — FLUCONAZOLE 100 MG PO TABS: 100.0000 mg | ORAL_TABLET | Freq: Every day | ORAL | 0 refills | Status: DC

## 2017-07-28 NOTE — Telephone Encounter (Signed)
I have reviewed her recent CT scans with GYN oncology Drs. Denman George and [employer]. Will schedule her for GYN consultation. I spoke with pt and she is agreeable.   Truitt Merle  07/28/2017

## 2017-07-28 NOTE — Telephone Encounter (Signed)
Pt left a message requesting IVF on Friday- message sent to scheduler to add.   Also requesting a refill of antibiotic for thrust- states it is a little better, but still present on tongue.

## 2017-07-28 NOTE — Telephone Encounter (Signed)
Pt will come in Friday at 1:00pm for IVF.   Is not using nystatin. Dr Burr Medico gave approval for Fluconazole for 5 days

## 2017-07-28 NOTE — Telephone Encounter (Signed)
Please make sure she is using nystatin mouthwash 4 times a day.  If it is, please refill fluconazole 100 mg daily for 5 days.  Truitt Merle MD

## 2017-07-29 ENCOUNTER — Encounter (HOSPITAL_COMMUNITY): Payer: Self-pay | Admitting: *Deleted

## 2017-07-29 ENCOUNTER — Ambulatory Visit (HOSPITAL_BASED_OUTPATIENT_CLINIC_OR_DEPARTMENT_OTHER): Payer: Federal, State, Local not specified - PPO | Admitting: Medical

## 2017-07-29 ENCOUNTER — Ambulatory Visit: Payer: Federal, State, Local not specified - PPO

## 2017-07-29 ENCOUNTER — Telehealth: Payer: Self-pay | Admitting: Medical Oncology

## 2017-07-29 ENCOUNTER — Inpatient Hospital Stay (HOSPITAL_COMMUNITY)
Admission: AD | Admit: 2017-07-29 | Discharge: 2017-08-01 | DRG: 947 | Disposition: A | Discharge status: Home / Self Care | Payer: Federal, State, Local not specified - PPO | Source: Ambulatory Visit | Attending: Internal Medicine | Admitting: Internal Medicine

## 2017-07-29 ENCOUNTER — Telehealth: Payer: Self-pay | Admitting: *Deleted

## 2017-07-29 VITALS — BP 137/70 | HR 99 | Temp 99.0°F | Resp 18

## 2017-07-29 DIAGNOSIS — G893 Neoplasm related pain (acute) (chronic): Secondary | ICD-10-CM

## 2017-07-29 DIAGNOSIS — G629 Polyneuropathy, unspecified: Secondary | ICD-10-CM | POA: Diagnosis present

## 2017-07-29 DIAGNOSIS — Z8673 Personal history of transient ischemic attack (TIA), and cerebral infarction without residual deficits: Secondary | ICD-10-CM

## 2017-07-29 DIAGNOSIS — R112 Nausea with vomiting, unspecified: Secondary | ICD-10-CM

## 2017-07-29 DIAGNOSIS — E785 Hyperlipidemia, unspecified: Secondary | ICD-10-CM | POA: Diagnosis present

## 2017-07-29 DIAGNOSIS — E876 Hypokalemia: Secondary | ICD-10-CM | POA: Diagnosis not present

## 2017-07-29 DIAGNOSIS — E860 Dehydration: Secondary | ICD-10-CM | POA: Diagnosis not present

## 2017-07-29 DIAGNOSIS — G459 Transient cerebral ischemic attack, unspecified: Secondary | ICD-10-CM | POA: Diagnosis present

## 2017-07-29 DIAGNOSIS — T451X5A Adverse effect of antineoplastic and immunosuppressive drugs, initial encounter: Secondary | ICD-10-CM | POA: Diagnosis present

## 2017-07-29 DIAGNOSIS — R110 Nausea: Secondary | ICD-10-CM

## 2017-07-29 DIAGNOSIS — D6481 Anemia due to antineoplastic chemotherapy: Secondary | ICD-10-CM | POA: Diagnosis present

## 2017-07-29 DIAGNOSIS — R1012 Left upper quadrant pain: Secondary | ICD-10-CM | POA: Diagnosis present

## 2017-07-29 DIAGNOSIS — C221 Intrahepatic bile duct carcinoma: Secondary | ICD-10-CM

## 2017-07-29 DIAGNOSIS — F0280 Dementia in other diseases classified elsewhere without behavioral disturbance: Secondary | ICD-10-CM | POA: Diagnosis present

## 2017-07-29 DIAGNOSIS — B370 Candidal stomatitis: Secondary | ICD-10-CM | POA: Diagnosis present

## 2017-07-29 DIAGNOSIS — Z808 Family history of malignant neoplasm of other organs or systems: Secondary | ICD-10-CM

## 2017-07-29 DIAGNOSIS — E43 Unspecified severe protein-calorie malnutrition: Secondary | ICD-10-CM | POA: Diagnosis present

## 2017-07-29 DIAGNOSIS — C7800 Secondary malignant neoplasm of unspecified lung: Secondary | ICD-10-CM

## 2017-07-29 DIAGNOSIS — K5900 Constipation, unspecified: Secondary | ICD-10-CM | POA: Diagnosis present

## 2017-07-29 DIAGNOSIS — K219 Gastro-esophageal reflux disease without esophagitis: Secondary | ICD-10-CM | POA: Diagnosis present

## 2017-07-29 DIAGNOSIS — C7801 Secondary malignant neoplasm of right lung: Secondary | ICD-10-CM

## 2017-07-29 DIAGNOSIS — D709 Neutropenia, unspecified: Secondary | ICD-10-CM | POA: Diagnosis present

## 2017-07-29 DIAGNOSIS — C786 Secondary malignant neoplasm of retroperitoneum and peritoneum: Secondary | ICD-10-CM

## 2017-07-29 DIAGNOSIS — Z803 Family history of malignant neoplasm of breast: Secondary | ICD-10-CM

## 2017-07-29 DIAGNOSIS — C7802 Secondary malignant neoplasm of left lung: Secondary | ICD-10-CM | POA: Diagnosis present

## 2017-07-29 DIAGNOSIS — Z800 Family history of malignant neoplasm of digestive organs: Secondary | ICD-10-CM

## 2017-07-29 DIAGNOSIS — Z681 Body mass index (BMI) 19 or less, adult: Secondary | ICD-10-CM

## 2017-07-29 LAB — CBC WITH DIFFERENTIAL/PLATELET
BASO%: 0 % (ref 0.0–2.0)
Basophils Absolute: 0 10*3/uL (ref 0.0–0.1)
EOS%: 0.6 % (ref 0.0–7.0)
Eosinophils Absolute: 0 10*3/uL (ref 0.0–0.5)
HCT: 33.7 % — ABNORMAL LOW (ref 34.8–46.6)
HGB: 11 g/dL — ABNORMAL LOW (ref 11.6–15.9)
LYMPH%: 49.4 % (ref 14.0–49.7)
MCH: 28.5 pg (ref 25.1–34.0)
MCHC: 32.6 g/dL (ref 31.5–36.0)
MCV: 87.3 fL (ref 79.5–101.0)
MONO#: 0.2 10*3/uL (ref 0.1–0.9)
MONO%: 6.1 % (ref 0.0–14.0)
NEUT#: 1.5 10*3/uL (ref 1.5–6.5)
NEUT%: 43.9 % (ref 38.4–76.8)
Platelets: 163 10*3/uL (ref 145–400)
RBC: 3.86 10*6/uL (ref 3.70–5.45)
RDW: 13.9 % (ref 11.2–14.5)
WBC: 3.5 10*3/uL — ABNORMAL LOW (ref 3.9–10.3)
lymph#: 1.7 10*3/uL (ref 0.9–3.3)

## 2017-07-29 LAB — COMPREHENSIVE METABOLIC PANEL
ALT: 12 U/L (ref 0–55)
AST: 19 U/L (ref 5–34)
Albumin: 3.8 g/dL (ref 3.5–5.0)
Alkaline Phosphatase: 29 U/L — ABNORMAL LOW (ref 40–150)
Anion Gap: 11 mEq/L (ref 3–11)
BUN: 4.2 mg/dL — ABNORMAL LOW (ref 7.0–26.0)
CO2: 23 mEq/L (ref 22–29)
Calcium: 8.7 mg/dL (ref 8.4–10.4)
Chloride: 107 mEq/L (ref 98–109)
Creatinine: 0.6 mg/dL (ref 0.6–1.1)
EGFR: >60 mL/min/{1.73_m2} (ref >60)
Glucose: 74 mg/dl (ref 70–140)
Potassium: 3.8 mEq/L (ref 3.5–5.1)
Sodium: 142 mEq/L (ref 136–145)
Total Bilirubin: 0.39 mg/dL (ref 0.20–1.20)
Total Protein: 6.3 g/dL — ABNORMAL LOW (ref 6.4–8.3)

## 2017-07-29 LAB — URINALYSIS, ROUTINE W REFLEX MICROSCOPIC
Bilirubin Urine: NEGATIVE
Glucose, UA: NEGATIVE mg/dL
Hgb urine dipstick: NEGATIVE
Ketones, ur: 20 mg/dL — AB
Leukocytes, UA: NEGATIVE
Nitrite: NEGATIVE
Protein, ur: NEGATIVE mg/dL
Specific Gravity, Urine: 1.004 — ABNORMAL LOW (ref 1.005–1.030)
pH: 6 (ref 5.0–8.0)

## 2017-07-29 MED ORDER — SODIUM CHLORIDE 0.9% FLUSH
10.0000 mL | INTRAVENOUS | Status: DC | PRN
  Administered 2017-07-29: 10 mL
  Filled 2017-07-29: qty 10

## 2017-07-29 MED ORDER — ACETAMINOPHEN 650 MG RE SUPP: 650.0000 mg | Freq: Four times a day (QID) | RECTAL | Status: DC | PRN

## 2017-07-29 MED ORDER — ONDANSETRON HCL 4 MG PO TABS: 4.0000 mg | ORAL_TABLET | Freq: Four times a day (QID) | ORAL | Status: DC | PRN

## 2017-07-29 MED ORDER — SODIUM CHLORIDE 0.9 % IV SOLN
25.0000 mg | Freq: Once | INTRAVENOUS | Status: AC
  Administered 2017-07-29: 25 mg via INTRAVENOUS
  Filled 2017-07-29: qty 1

## 2017-07-29 MED ORDER — MORPHINE SULFATE (PF) 4 MG/ML IV SOLN
INTRAVENOUS | Status: AC
  Filled 2017-07-29: qty 1

## 2017-07-29 MED ORDER — IPRATROPIUM BROMIDE 0.02 % IN SOLN: 0.5000 mg | RESPIRATORY_TRACT | Status: DC | PRN

## 2017-07-29 MED ORDER — SENNA 8.6 MG PO TABS
1.0000 | ORAL_TABLET | Freq: Two times a day (BID) | ORAL | Status: DC
  Administered 2017-07-29 – 2017-07-31 (×4): 8.6 mg via ORAL
  Filled 2017-07-29 (×4): qty 1

## 2017-07-29 MED ORDER — ASPIRIN 81 MG PO CHEW
81.0000 mg | CHEWABLE_TABLET | Freq: Every day | ORAL | Status: DC
  Administered 2017-07-30 – 2017-08-01 (×3): 81 mg via ORAL
  Filled 2017-07-29 (×4): qty 1

## 2017-07-29 MED ORDER — ACETAMINOPHEN 325 MG PO TABS: 650.0000 mg | ORAL_TABLET | Freq: Four times a day (QID) | ORAL | Status: DC | PRN

## 2017-07-29 MED ORDER — IPRATROPIUM-ALBUTEROL 0.5-2.5 (3) MG/3ML IN SOLN: 3.0000 mL | RESPIRATORY_TRACT | Status: DC | PRN

## 2017-07-29 MED ORDER — ONDANSETRON HCL 4 MG/2ML IJ SOLN
4.0000 mg | Freq: Four times a day (QID) | INTRAMUSCULAR | Status: DC | PRN
  Administered 2017-07-29: 4 mg via INTRAVENOUS
  Filled 2017-07-29: qty 2

## 2017-07-29 MED ORDER — DICYCLOMINE HCL 20 MG PO TABS
20.0000 mg | ORAL_TABLET | Freq: Three times a day (TID) | ORAL | Status: DC | PRN
  Filled 2017-07-29: qty 1

## 2017-07-29 MED ORDER — LORATADINE 10 MG PO TABS
10.0000 mg | ORAL_TABLET | Freq: Every day | ORAL | Status: DC
  Administered 2017-07-29 – 2017-08-01 (×4): 10 mg via ORAL
  Filled 2017-07-29 (×4): qty 1

## 2017-07-29 MED ORDER — MORPHINE SULFATE 4 MG/ML IJ SOLN
2.0000 mg | Freq: Once | INTRAMUSCULAR | Status: AC
  Administered 2017-07-29: 2 mg via INTRAVENOUS
  Filled 2017-07-29: qty 1

## 2017-07-29 MED ORDER — ALBUTEROL SULFATE (2.5 MG/3ML) 0.083% IN NEBU: 2.5000 mg | INHALATION_SOLUTION | RESPIRATORY_TRACT | Status: DC | PRN

## 2017-07-29 MED ORDER — SODIUM CHLORIDE 0.9 % IV SOLN
INTRAVENOUS | Status: DC
  Filled 2017-07-29 (×3): qty 1000

## 2017-07-29 MED ORDER — MORPHINE SULFATE (PF) 4 MG/ML IV SOLN
4.0000 mg | INTRAVENOUS | Status: DC | PRN
  Administered 2017-07-29 – 2017-08-01 (×10): 4 mg via INTRAVENOUS
  Filled 2017-07-29 (×10): qty 1

## 2017-07-29 MED ORDER — ENOXAPARIN SODIUM 40 MG/0.4ML ~~LOC~~ SOLN
40.0000 mg | SUBCUTANEOUS | Status: DC
  Administered 2017-07-31: 40 mg via SUBCUTANEOUS
  Filled 2017-07-29 (×3): qty 0.4

## 2017-07-29 MED ORDER — DRONABINOL 5 MG PO CAPS
5.0000 mg | ORAL_CAPSULE | Freq: Two times a day (BID) | ORAL | Status: DC
  Administered 2017-07-30 – 2017-08-01 (×5): 5 mg via ORAL
  Filled 2017-07-29 (×5): qty 1

## 2017-07-29 MED ORDER — SODIUM CHLORIDE 0.9 % IV BOLUS (SEPSIS)
1000.0000 mL | Freq: Once | INTRAVENOUS | Status: AC
  Administered 2017-07-29: 1000 mL via INTRAVENOUS

## 2017-07-29 MED ORDER — FLUCONAZOLE 100 MG PO TABS
100.0000 mg | ORAL_TABLET | Freq: Every day | ORAL | Status: DC
  Administered 2017-07-29 – 2017-08-01 (×4): 100 mg via ORAL
  Filled 2017-07-29 (×4): qty 1

## 2017-07-29 MED ORDER — VITAMIN D 1000 UNITS PO TABS
5000.0000 [IU] | ORAL_TABLET | Freq: Every day | ORAL | Status: DC
  Administered 2017-07-29 – 2017-08-01 (×4): 5000 [IU] via ORAL
  Filled 2017-07-29 (×4): qty 5

## 2017-07-29 MED ORDER — ALPRAZOLAM 0.5 MG PO TABS: 0.5000 mg | ORAL_TABLET | Freq: Three times a day (TID) | ORAL | Status: DC | PRN

## 2017-07-29 MED ORDER — PROMETHAZINE HCL 25 MG/ML IJ SOLN: 25.0000 mg | Freq: Once | INTRAMUSCULAR | Status: DC

## 2017-07-29 MED ORDER — SORBITOL 70 % SOLN: 30.0000 mL | Freq: Every day | Status: DC | PRN

## 2017-07-29 MED ORDER — POLYETHYLENE GLYCOL 3350 17 G PO PACK: 17.0000 g | PACK | Freq: Every day | ORAL | Status: DC | PRN

## 2017-07-29 MED ORDER — SODIUM CHLORIDE 0.9 % IV SOLN
INTRAVENOUS | Status: DC
  Administered 2017-07-29: 11:00:00 via INTRAVENOUS

## 2017-07-29 MED ORDER — MORPHINE SULFATE (PF) 2 MG/ML IV SOLN
INTRAVENOUS | Status: AC
  Administered 2017-07-29: 2 mg via INTRAVENOUS
  Filled 2017-07-29: qty 1

## 2017-07-29 MED ORDER — OXYCODONE HCL 5 MG PO TABS
5.0000 mg | ORAL_TABLET | ORAL | Status: DC | PRN
  Administered 2017-07-29 – 2017-07-31 (×4): 10 mg via ORAL
  Filled 2017-07-29 (×4): qty 2

## 2017-07-29 MED ORDER — OXYCODONE HCL ER 15 MG PO T12A
30.0000 mg | EXTENDED_RELEASE_TABLET | Freq: Two times a day (BID) | ORAL | Status: DC
  Administered 2017-07-29 – 2017-07-31 (×4): 30 mg via ORAL
  Filled 2017-07-29 (×4): qty 2

## 2017-07-29 MED ORDER — HEPARIN SOD (PORK) LOCK FLUSH 100 UNIT/ML IV SOLN
500.0000 [IU] | Freq: Once | INTRAVENOUS | Status: DC | PRN
  Filled 2017-07-29: qty 5

## 2017-07-29 MED ORDER — PANTOPRAZOLE SODIUM 40 MG PO TBEC
40.0000 mg | DELAYED_RELEASE_TABLET | Freq: Two times a day (BID) | ORAL | Status: DC
  Administered 2017-07-30 – 2017-07-31 (×3): 40 mg via ORAL
  Filled 2017-07-29 (×3): qty 1

## 2017-07-29 MED ORDER — MORPHINE SULFATE (PF) 4 MG/ML IV SOLN
4.0000 mg | INTRAVENOUS | Status: DC | PRN
  Administered 2017-07-29: 4 mg via INTRAVENOUS
  Filled 2017-07-29 (×2): qty 1

## 2017-07-29 MED ORDER — MORPHINE SULFATE (PF) 2 MG/ML IV SOLN: 2.0000 mg | INTRAVENOUS | Status: DC | PRN

## 2017-07-29 MED ORDER — SCOPOLAMINE 1 MG/3DAYS TD PT72
1.0000 | MEDICATED_PATCH | TRANSDERMAL | Status: DC
Start: 2017-07-29 — End: 2017-07-31
  Administered 2017-07-29: 1.5 mg via TRANSDERMAL
  Filled 2017-07-29: qty 1

## 2017-07-29 NOTE — Progress Notes (Signed)
[facility] Progress Note   Nancy [name] [id number] 07-31-1966 51 y.o.  [name] is managed by Dr. Truitt Merle   Actively treated with chemotherapy: Chemotherapy currently on hold while awaiting IR biopsy of a peritoneal lesion.  Assessment: Plan:    Neoplasm related pain - Plan: morphine 4 MG/ML injection 2 mg, 0.9 %  sodium chloride infusion, morphine 4 MG/ML injection 2 mg  Nausea without vomiting - Plan: promethazine (PHENERGAN) 25 mg in sodium chloride 0.9 % 50 mL infusion, DISCONTINUED: promethazine (PHENERGAN) injection 25 mg   Neoplasm related pain:  The patient was given morphine 2 mg IV x 3 today.  She was told to increase her OxyIR from 1 tablet every 4 hours to 2 tablets every 4 hours as needed for pain.  She was also told to continue to maintain good bowel regiment.  The patient is being admitted to the care of Dr. Grandville Silos.  Dr. Grandville Silos requests that Dr. Burr Medico follow the patient with him this weekend. Dr. Truitt Merle suggests that the patient may need to have a repeat CT scan of the abdomen and pelvis to rule out any acute process.   Nausea without vomiting: The patient was given Phenergan 25 mg IV x1.  Please see After Visit Summary for patient specific instructions.  Future Appointments  Date Time Provider [place]  08/04/2017  9:45 AM [facility] LAB 1 [facility] None  08/04/2017 10:00 AM [facility] I26 DNS [facility] None  08/04/2017 10:30 AM Alla Feeling, NP [facility] None  08/05/2017  7:00 AM [facility] ROOM [facility] None  08/05/2017  9:00 AM [facility] 1 [facility] [place]  08/12/2017 12:15 PM Clarke-Pearson, Daniel, MD [facility] None    No orders of the defined types were placed in this encounter.      Subjective:   Patient ID:  Nancy [name] is a 51 y.o. (DOB [birth year]) female.  Chief Complaint: No chief complaint on file.   HPI Nancy [name] is a 51 year old female with a diagnosis of a metastatic  cholangiocarcinoma who presents to the office today in a pain crisis.  The patient is having abdominal pain localized over her left abdomen with radiation to her back.  She has prescriptions at home for oxycodone IR 5 mg, 1-2 p.o. every 4 hours as needed for pain OxyContin 20 mg every 12 hours.  She took oxycodone IR early this morning and again after getting.  She also took OxyContin 20 mg this morning.  Despite this she is having 10/10 pain.  She did have a bowel movement this morning.  She is having some nausea without vomiting.  She has noted a hard, painless area in her left chest wall immediately inferior to her left breast versus in her inferior left breast.  She only noted this area earlier this week.    Her most recent restaging CT scans from 07/20/2017 showed the following: CT CHEST  1. Interval development of 2 small new pulmonary nodules concerning for progressive, or new metastatic disease. Recommend continued attention on follow-up imaging. 2. The previously noted right upper and left upper lobe pulmonary nodules remain stable in size consistent with treated metastatic disease. 3. Stable position of right IJ approach power injectable port catheter. 4. Coronary artery calcifications. CT ABD/PELVIS  1. Marked interval enlargement of a complex, enhancing, cystic and solid left ovarian mass which now fills the posterior aspect of the pelvis exerting mass effect on the adjacent rectosigmoid colon and bladder. This likely accounts for the patient's lower abdominal  pain and sensation of constipation (there is no significant stool burden within the rectum or sigmoid colon). These findings are highly suspicious for a primary ovarian cystic or mucinous adenocarcinoma. 2. Enlarging omental implants in the left lower quadrant highly concerning for metastatic ovarian carcinoma. 3. New/enlarging subcapsular hepatic lesion may represent progressive cholangio or ovarian carcinoma  metastases. 4. Enlarging caudate lobe lesion and aortocaval/portacaval confluent necrotic adenopathy concerning for progressive metastatic cholangiocarcinoma. 5. The segment 6 hepatic lesion remains stable or may be smaller on today's examination.  A CA 125 from 07/25/2017 returned elevated at 100.6. She is scheduled for follow-up on 08/04/2017 and is scheduled for her IR biopsy on 08/05/2017.  She presents to the clinic today with her aunt.  She was dosed with morphine 2 mg IV x3 during her visit today.  Despite that she continues to have significant abdominal pain.  Dr. Truitt Merle would like her to be admitted this weekend for pain control.  Dr. Truitt Merle also suggests that the patient may need to have a repeat CT scan of the abdomen and pelvis to rule out any acute process.  Medications: I have reviewed the patient's current medications.  Allergies:  Allergies  Allergen Reactions  . Penicillins Anaphylaxis    Has patient had a PCN reaction causing immediate rash, facial/tongue/throat swelling, SOB or lightheadedness with hypotensionYes Swelling in throat  Has patient had a PCN reaction causing severe rash involving mucus membranes or skin necrosis:/No Has patient had a PCN reaction that required hospitalization/No Has patient had a PCN reaction occurring within the last 10 years:NO If all of the above answers are "NO", then may proceed with Cephalosporin use.   . Lipitor [Atorvastatin] Palpitations    Tingling, flushing    Past Medical History:  Diagnosis Date  . Allergy    SEASONAL  . Anemia    [employer]  . Atopic eczema   . cholangio ca dx'd 07/12/16  . Fibrocystic breast disease   . GERD (gastroesophageal reflux disease)   . Hx of migraines    seasonal   . Pneumonia    6 years ago   . TIA (transient ischemic attack)     Past Surgical History:  Procedure Laterality Date  . EUS N/A 07/07/2016   Procedure: UPPER ENDOSCOPIC ULTRASOUND (EUS) RADIAL;  Surgeon: [name], MD;  Location: [facility];  Service: Endoscopy;  Laterality: N/A;  . IR GENERIC HISTORICAL  08/03/2016   IR US GUIDE VASC ACCESS RIGHT 08/03/2016 Corrie Mckusick, DO [facility]  . IR GENERIC HISTORICAL  08/03/2016   IR FLUORO GUIDE PORT INSERTION RIGHT 08/03/2016 Corrie Mckusick, DO [facility]  . MYOMECTOMY  2002    Family History  Problem Relation Age of Onset  . Gout Mother   . Diabetes Mother   . Hypertension Father   . Diabetes Father   . Breast cancer Paternal Aunt   . Cancer Maternal Grandfather        throat cancer   . Cancer Cousin 51       GI cancer   . Thyroid disease Neg Hx     Social History   Socioeconomic History  . Marital status: Married    Spouse name: Not on file  . Number of children: 0  . Years of education: 72  . Highest education level: Not on file  Social Needs  . Financial resource strain: Not on file  . Food insecurity - worry: Not on file  . Food insecurity - inability:  Not on file  . Transportation needs - medical: Not on file  . Transportation needs - non-medical: Not on file  Occupational History  . Occupation: HR fml[employer]    Employer: [employer]    Employer: [employer]  Tobacco Use  . Smoking status: Never Smoker  . Smokeless tobacco: Never Used  Substance and Sexual Activity  . Alcohol use: Yes    Comment: maybe 3 times a year  . Drug use: No  . Sexual activity: Yes    Partners: Male  Other Topics Concern  . Not on file  Social History Narrative  . Not on file    Past Medical History, Surgical history, Social history, and Family history were reviewed and updated as appropriate.   Please see review of systems for further details on the patient's review from today.   Review of Systems:  Review of Systems  Constitutional: Negative for chills, diaphoresis and fever.  Respiratory: Negative for chest tightness and shortness of breath.   Cardiovascular: Negative for chest pain.  Gastrointestinal: Positive for abdominal pain and  nausea. Negative for constipation and vomiting.  Musculoskeletal:       Left chest wall mass versus inferior left breast mass.    Objective:   Physical Exam:  BP (!) 156/97 (BP Location: Right Arm, Patient Position: Sitting)   Pulse (!) 106  ECOG: 2   Physical Exam  Constitutional: She appears distressed.  HENT:  Head: Normocephalic and atraumatic.  Cardiovascular: S1 normal and S2 normal. Tachycardia present.  Pulmonary/Chest: Effort normal and breath sounds normal. No respiratory distress. She has no wheezes. She has no rales.    Abdominal: Soft. Bowel sounds are normal. She exhibits no distension.  Musculoskeletal: She exhibits no edema.  Neurological: She is alert. Coordination (The patient is ambulating with a wheelchair) abnormal.  Skin: Skin is warm and dry. She is not diaphoretic.    Lab Review:     Component Value Date/Time   NA 137 07/25/2017 1019   K 3.5 07/25/2017 1019   CL 100 (L) 05/17/2017 0335   CO2 29 07/25/2017 1019   GLUCOSE 89 07/25/2017 1019   BUN 9.3 07/25/2017 1019   CREATININE 0.8 07/25/2017 1019   CALCIUM 9.2 07/25/2017 1019   PROT 6.7 07/25/2017 1019   ALBUMIN 4.1 07/25/2017 1019   AST 22 07/25/2017 1019   ALT 14 07/25/2017 1019   ALKPHOS 31 (L) 07/25/2017 1019   BILITOT 0.33 07/25/2017 1019   GFRNONAA >60 05/17/2017 0335   GFRAA >60 05/17/2017 0335       Component Value Date/Time   WBC 3.8 (L) 07/25/2017 1019   WBC 4.0 05/17/2017 0335   RBC 3.91 07/25/2017 1019   RBC 3.99 05/17/2017 0335   HGB 11.2 (L) 07/25/2017 1019   HCT 34.0 (L) 07/25/2017 1019   PLT 173 07/25/2017 1019   MCV 87.0 07/25/2017 1019   MCH 28.6 07/25/2017 1019   MCH 29.6 05/17/2017 0335   MCHC 32.9 07/25/2017 1019   MCHC 34.4 05/17/2017 0335   RDW 13.9 07/25/2017 1019   LYMPHSABS 1.5 07/25/2017 1019   MONOABS 0.3 07/25/2017 1019   EOSABS 0.1 07/25/2017 1019   BASOSABS 0.0 07/25/2017 1019   -------------------------------  Imaging from last 24 hours (if  applicable):  Radiology interpretation: Ct Chest W Contrast  Result Date: 07/21/2017 CLINICAL DATA:  51 year old female with a history of cholangiocarcinoma diagnosed in November of 2017. Current complaint of diffuse abdominal pain and constipation for the past 3 days. EXAM: CT  CHEST, ABDOMEN AND PELVIS WITHOUT CONTRAST TECHNIQUE: Multidetector CT imaging of the chest, abdomen and pelvis was performed following the standard protocol without IV contrast. COMPARISON:  Numerous prior studies including CT scans of chest (04/13/2014) and the abdomen and pelvis dated 04/13/2017 and 01/25/2017 FINDINGS: CT CHEST FINDINGS Cardiovascular: Right IJ approach single-lumen power injectable port catheter. Catheter tip in good position at the superior cavoatrial junction. The heart is normal in size. Conventional 3 vessel aortic arch anatomy. The aorta is normal in size. Calcifications are present along the proximal left anterior descending coronary artery. No pericardial effusion. Unremarkable main and central pulmonary arteries. Mediastinum/Nodes: Unremarkable CT appearance of the thyroid gland. No suspicious mediastinal or hilar adenopathy. No soft tissue mediastinal mass. The thoracic esophagus is unremarkable. Lungs/Pleura: Stable 3 mm left upper lobe pulmonary nodule (image 41, series 7). Stable 4 mm right upper lobe pulmonary nodule (image 62 series 7). These nodules were previously larger on prior imaging from November of 2017. Findings indicate persistent and unchanged response to therapy. New 3 mm nodule in the anterior aspect of the right upper lobe several cm away from the treated metastatic nodule described above on image 60 of series 7. Interval development of a 4 mm subpleural pulmonary nodule associated with the superior aspect of the left major fissure (image 44, series 7). In retrospect, this may have been present previously as a tiny 1 mm density. Musculoskeletal: No acute fracture or aggressive appearing  lytic or blastic osseous lesion. CT ABDOMEN PELVIS FINDINGS Hepatobiliary: Slight interval enlargement in the size of the caudate lobe mass presently measuring 2.4 x 2.0 cm compared to 2.4 x 1.5 cm previously. The lesion is somewhat irregular in shape which makes precise measurements difficult. The previously noted segment 6 lesion remains similar to slightly smaller at a maximum of 0.7 cm. The conglomerate of necrotic aortocaval/ portacaval lymphadenopathy is enlarging now measuring 3.8 x 3.1 cm compared to 3.5 x 2.6 cm previously. Progressive geographic subcapsular low-attenuation along the anterior aspect of hepatic segment 4B and 3 adjacent to the fissure for the falciform ligament measures 2.5 x 0.9 cm. This was previously barely detectable at 0.8 cm and is favored to represent metastatic disease. The gallbladder remains normal in appearance. No significant biliary ductal dilatation. Pancreas: The pancreas itself remains normal in appearance. Spleen: Normal in size without focal abnormality. Adrenals/Urinary Tract: Adrenal glands are unremarkable. Kidneys are normal, without renal calculi, focal lesion, or hydronephrosis. Bladder is unremarkable. Stomach/Bowel: No focal bowel wall thickening or evidence of obstruction. Vascular/Lymphatic: Minimal atherosclerotic vascular calcifications along the abdominal aorta. No aneurysm, or dissection. Progressive aortocaval and peripancreatic lymphadenopathy. Aortocaval lymph node posterior to the pancreatic neck now measures 1.7 cm compared to 1.3 cm previously. Reproductive: Marked interval enlargement of the complex cystic and solid mass arising from the left ovary. The mass has become large enough that it is not difficult to measure precisely. Using the coronal, and sagittal reformatted images the mass measures approximately 13 x 10 x 7.7 cm compared to a maximum of 6.8 x 4.1 cm previously. The mass exerts local mass effect on the uterus, bladder and rectosigmoid  colon. Multi fibroid uterus again noted with multiple calcified and dystrophic uterine fibroids. The right ovary is unremarkable. Other: Enlarging omental masses in the left lateral inguinal recess. The more superior measures 1.7 x 1.8 cm while the more inferior measures 2.3 x 1.5 cm. No definite ascites. Musculoskeletal: No acute fracture or aggressive appearing lytic or blastic osseous lesion. IMPRESSION: CT CHEST 1.  Interval development of 2 small new pulmonary nodules concerning for progressive, or new metastatic disease. Recommend continued attention on follow-up imaging. 2. The previously noted right upper and left upper lobe pulmonary nodules remain stable in size consistent with treated metastatic disease. 3. Stable position of right IJ approach power injectable port catheter. 4. Coronary artery calcifications. CT ABD/PELVIS 1. Marked interval enlargement of a complex, enhancing, cystic and solid left ovarian mass which now fills the posterior aspect of the pelvis exerting mass effect on the adjacent rectosigmoid colon and bladder. This likely accounts for the patient's lower abdominal pain and sensation of constipation (there is no significant stool burden within the rectum or sigmoid colon). These findings are highly suspicious for a primary ovarian cystic or mucinous adenocarcinoma. 2. Enlarging omental implants in the left lower quadrant highly concerning for metastatic ovarian carcinoma. 3. New/enlarging subcapsular hepatic lesion may represent progressive cholangio or ovarian carcinoma metastases. 4. Enlarging caudate lobe lesion and aortocaval/portacaval confluent necrotic adenopathy concerning for progressive metastatic cholangiocarcinoma. 5. The segment 6 hepatic lesion remains stable or may be smaller on today's examination. These results will be called to the ordering clinician or representative by the Radiologist Assistant, and communication documented in the PACS or zVision Dashboard.  Electronically Signed   By: Jacqulynn Cadet M.D.   On: 07/21/2017 09:54   Ct Abdomen Pelvis W Contrast  Result Date: 07/21/2017 CLINICAL DATA:  51 year old female with a history of cholangiocarcinoma diagnosed in November of 2017. Current complaint of diffuse abdominal pain and constipation for the past 3 days. EXAM: CT CHEST, ABDOMEN AND PELVIS WITHOUT CONTRAST TECHNIQUE: Multidetector CT imaging of the chest, abdomen and pelvis was performed following the standard protocol without IV contrast. COMPARISON:  Numerous prior studies including CT scans of chest (04/13/2014) and the abdomen and pelvis dated 04/13/2017 and 01/25/2017 FINDINGS: CT CHEST FINDINGS Cardiovascular: Right IJ approach single-lumen power injectable port catheter. Catheter tip in good position at the superior cavoatrial junction. The heart is normal in size. Conventional 3 vessel aortic arch anatomy. The aorta is normal in size. Calcifications are present along the proximal left anterior descending coronary artery. No pericardial effusion. Unremarkable main and central pulmonary arteries. Mediastinum/Nodes: Unremarkable CT appearance of the thyroid gland. No suspicious mediastinal or hilar adenopathy. No soft tissue mediastinal mass. The thoracic esophagus is unremarkable. Lungs/Pleura: Stable 3 mm left upper lobe pulmonary nodule (image 41, series 7). Stable 4 mm right upper lobe pulmonary nodule (image 62 series 7). These nodules were previously larger on prior imaging from November of 2017. Findings indicate persistent and unchanged response to therapy. New 3 mm nodule in the anterior aspect of the right upper lobe several cm away from the treated metastatic nodule described above on image 60 of series 7. Interval development of a 4 mm subpleural pulmonary nodule associated with the superior aspect of the left major fissure (image 44, series 7). In retrospect, this may have been present previously as a tiny 1 mm density.  Musculoskeletal: No acute fracture or aggressive appearing lytic or blastic osseous lesion. CT ABDOMEN PELVIS FINDINGS Hepatobiliary: Slight interval enlargement in the size of the caudate lobe mass presently measuring 2.4 x 2.0 cm compared to 2.4 x 1.5 cm previously. The lesion is somewhat irregular in shape which makes precise measurements difficult. The previously noted segment 6 lesion remains similar to slightly smaller at a maximum of 0.7 cm. The conglomerate of necrotic aortocaval/ portacaval lymphadenopathy is enlarging now measuring 3.8 x 3.1 cm compared to 3.5 x 2.6  cm previously. Progressive geographic subcapsular low-attenuation along the anterior aspect of hepatic segment 4B and 3 adjacent to the fissure for the falciform ligament measures 2.5 x 0.9 cm. This was previously barely detectable at 0.8 cm and is favored to represent metastatic disease. The gallbladder remains normal in appearance. No significant biliary ductal dilatation. Pancreas: The pancreas itself remains normal in appearance. Spleen: Normal in size without focal abnormality. Adrenals/Urinary Tract: Adrenal glands are unremarkable. Kidneys are normal, without renal calculi, focal lesion, or hydronephrosis. Bladder is unremarkable. Stomach/Bowel: No focal bowel wall thickening or evidence of obstruction. Vascular/Lymphatic: Minimal atherosclerotic vascular calcifications along the abdominal aorta. No aneurysm, or dissection. Progressive aortocaval and peripancreatic lymphadenopathy. Aortocaval lymph node posterior to the pancreatic neck now measures 1.7 cm compared to 1.3 cm previously. Reproductive: Marked interval enlargement of the complex cystic and solid mass arising from the left ovary. The mass has become large enough that it is not difficult to measure precisely. Using the coronal, and sagittal reformatted images the mass measures approximately 13 x 10 x 7.7 cm compared to a maximum of 6.8 x 4.1 cm previously. The mass exerts  local mass effect on the uterus, bladder and rectosigmoid colon. Multi fibroid uterus again noted with multiple calcified and dystrophic uterine fibroids. The right ovary is unremarkable. Other: Enlarging omental masses in the left lateral inguinal recess. The more superior measures 1.7 x 1.8 cm while the more inferior measures 2.3 x 1.5 cm. No definite ascites. Musculoskeletal: No acute fracture or aggressive appearing lytic or blastic osseous lesion. IMPRESSION: CT CHEST 1. Interval development of 2 small new pulmonary nodules concerning for progressive, or new metastatic disease. Recommend continued attention on follow-up imaging. 2. The previously noted right upper and left upper lobe pulmonary nodules remain stable in size consistent with treated metastatic disease. 3. Stable position of right IJ approach power injectable port catheter. 4. Coronary artery calcifications. CT ABD/PELVIS 1. Marked interval enlargement of a complex, enhancing, cystic and solid left ovarian mass which now fills the posterior aspect of the pelvis exerting mass effect on the adjacent rectosigmoid colon and bladder. This likely accounts for the patient's lower abdominal pain and sensation of constipation (there is no significant stool burden within the rectum or sigmoid colon). These findings are highly suspicious for a primary ovarian cystic or mucinous adenocarcinoma. 2. Enlarging omental implants in the left lower quadrant highly concerning for metastatic ovarian carcinoma. 3. New/enlarging subcapsular hepatic lesion may represent progressive cholangio or ovarian carcinoma metastases. 4. Enlarging caudate lobe lesion and aortocaval/portacaval confluent necrotic adenopathy concerning for progressive metastatic cholangiocarcinoma. 5. The segment 6 hepatic lesion remains stable or may be smaller on today's examination. These results will be called to the ordering clinician or representative by the Radiologist Assistant, and  communication documented in the PACS or zVision Dashboard. Electronically Signed   By: Jacqulynn Cadet M.D.   On: 07/21/2017 09:54        This patient was seen with Dr. Burr Medico with my treatment plan reviewed with her. She expressed agreement with my medical management of this patient.

## 2017-07-29 NOTE — Telephone Encounter (Signed)
Woke up at 2 am in severe pain described as cramping below left breast and under sternum. She took 2 oxycodone and 1 oxycontin since 2 am without little relief. Her voice sounds distressed. Per Dr Burr Medico , [facility] today . Appt made. -pt notified.

## 2017-07-29 NOTE — Progress Notes (Signed)
These preliminary result these preliminary results were noted.  Awaiting final report.

## 2017-07-29 NOTE — Telephone Encounter (Signed)
Called and left the patient a message to call the office back. Patient needs to be given new patient appt with Dr. Fermin Schwab on December 14th at 12:15pm

## 2017-07-29 NOTE — H&P (Signed)
History and Physical    Darshana Curnutt XBM:[id number] DOB: 10/12/1965 DOA: 07/29/2017  PCP: [name], DO  Patient coming from: Home  I have personally briefly reviewed patient's old medical records in [place]  Chief Complaint: Abdominal pain  HPI: Nancy [name] is a 51 y.o. female with medical history significant of gastroesophageal disease, metastatic cholangiocarcinoma diagnosed in November 2017, hyperlipidemia, seasonal allergies, migraines presented in follow-up by her oncologist office secondary to dehydration and worsening abdominal pain.  Patient stated that abdominal pain started at 2 AM on the day of admission and on the left side of the abdomen which is different from the site of the abnormal staining which is chronic dementia secondary to her cancer.  Patient describes the pain as cramping in nature, constant.  Patient denies any fevers, no chills, no nausea, no vomiting, no chest pain, no shortness of breath, no diarrhea, no constipation, no dysuria, no hematemesis, no melena, no hematochezia.  Patient does endorse some generalized weakness.  Patient also noted some occasional jerking every other day.  Patient while an elderly hematologist office patient is actually given several rounds of IV morphine to manage abdominal pain however no significant improvement and patient was subsequently recommended for admission.  Time of my assessment patient laying in bed with knees bent up rocking back and forth with significant abdominal discomfort/pain.  ED Course: Direct admission to the floor  Review of Systems: As per HPI otherwise 10 point review of systems negative.   Past Medical History:  Diagnosis Date  . Allergy    SEASONAL  . Anemia    [employer]  . Atopic eczema   . cholangio ca dx'd 07/12/16  . Fibrocystic breast disease   . GERD (gastroesophageal reflux disease)   . Hx of migraines    seasonal   . Pneumonia    6 years ago   . TIA (transient  ischemic attack)     Past Surgical History:  Procedure Laterality Date  . EUS N/A 07/07/2016   Procedure: UPPER ENDOSCOPIC ULTRASOUND (EUS) RADIAL;  Surgeon: [name], MD;  Location: [facility];  Service: Endoscopy;  Laterality: N/A;  . IR GENERIC HISTORICAL  08/03/2016   IR US GUIDE VASC ACCESS RIGHT 08/03/2016 Corrie Mckusick, DO [facility]  . IR GENERIC HISTORICAL  08/03/2016   IR FLUORO GUIDE PORT INSERTION RIGHT 08/03/2016 Corrie Mckusick, DO [facility]  . MYOMECTOMY  2002     reports that  has never smoked. she has never used smokeless tobacco. She reports that she drinks alcohol. She reports that she does not use drugs.  Allergies  Allergen Reactions  . Penicillins Anaphylaxis    Has patient had a PCN reaction causing immediate rash, facial/tongue/throat swelling, SOB or lightheadedness with hypotensionYes Swelling in throat  Has patient had a PCN reaction causing severe rash involving mucus membranes or skin necrosis:/No Has patient had a PCN reaction that required hospitalization/No Has patient had a PCN reaction occurring within the last 10 years:NO If all of the above answers are "NO", then may proceed with Cephalosporin use.   . Lipitor [Atorvastatin] Palpitations    Tingling, flushing    Family History  Problem Relation Age of Onset  . Gout Mother   . Diabetes Mother   . Hypertension Father   . Diabetes Father   . Breast cancer Paternal Aunt   . Cancer Maternal Grandfather        throat cancer   . Cancer Cousin 58  GI cancer   . Thyroid disease Neg Hx    Mother alive age 2 with a history of diabetes, rheumatoid arthritis, morbid obesity.  Father alive age 68 with history of diabetes and coronary artery disease.  Prior to Admission medications   Medication Sig Start Date End Date Taking? Authorizing Provider  ALPRAZolam Duanne Moron) 0.5 MG tablet Take 1 tablet (0.5 mg total) by mouth as needed for anxiety. 06/23/16  Yes Pyrtle, Lajuan Lines, MD  aspirin 81 MG  tablet Take 81 mg by mouth daily.   Yes [provider]  cetirizine (ZYRTEC) 10 MG tablet Take 10 mg by mouth daily as needed. For allergies    Yes [provider]  Cholecalciferol (VITAMIN D3) 5000 UNITS CAPS Take 1 capsule by mouth daily.   Yes [provider]  COLOSTRUM PO Take 1.25 g by mouth daily. 1.25 grams twice daily    Yes [provider]  dicyclomine (BENTYL) 20 MG tablet 3 (three) times daily as needed.  07/22/16  Yes [provider]  dronabinol (MARINOL) 5 MG capsule Take 1 capsule (5 mg total) by mouth 2 (two) times daily before a meal. 06/08/17  Yes Owens Shark, NP  fluconazole (DIFLUCAN) 100 MG tablet Take 1 tablet (100 mg total) by mouth daily. 07/28/17  Yes Truitt Merle, MD  lidocaine-prilocaine (EMLA) cream Apply 1 application topically as needed. Apply to portacath 1 1/2 hours - 2 hours prior to procedures as needed. 07/27/17  Yes Truitt Merle, MD  LORazepam (ATIVAN) 0.5 MG tablet Place under tongue and dissolve every 8 hours as needed for nausea. Do not take Xanax while taking this medication. 05/18/17  Yes Tanner, Lyndon Code., PA-C  magnesium hydroxide (MILK OF MAGNESIA) 400 MG/5ML suspension Take 30 mLs by mouth daily as needed for mild constipation.   Yes [provider]  Menaquinone-7 (VITAMIN K2) 40 MCG TABS Take by mouth.   Yes [provider]  mometasone (ELOCON) 0.1 % lotion Apply topically daily. 08/21/15  Yes [name], DO  omeprazole (PRILOSEC) 40 MG capsule Take 1 capsule (40 mg total) by mouth 2 (two) times daily before a meal. 07/22/17  Yes Truitt Merle, MD  ondansetron (ZOFRAN) 8 MG tablet Take 1 tablet (8 mg total) by mouth every 8 (eight) hours as needed for nausea or vomiting. 07/25/17  Yes Alla Feeling, NP  oxyCODONE (OXY IR/ROXICODONE) 5 MG immediate release tablet Take 1-2 tablets (5-10 mg total) every 4 (four) hours as needed by mouth for severe pain. 07/18/17  Yes Maryanna Shape, NP    oxyCODONE (OXYCONTIN) 20 mg 12 hr tablet Take 1 tablet (20 mg total) every 12 (twelve) hours by mouth. 07/13/17  Yes Tanner, Lyndon Code., PA-C  promethazine (PHENERGAN) 25 MG tablet Take 1 tablet (25 mg total) by mouth every 6 (six) hours as needed for nausea or vomiting. 05/18/17  Yes Tanner, Lyndon Code., PA-C  scopolamine (TRANSDERM-SCOP) 1 MG/3DAYS Place 1 patch (1.5 mg total) every 3 (three) days onto the skin. 07/13/17  Yes Tanner, Lyndon Code., PA-C  triamcinolone cream (KENALOG) 0.1 % Apply topically.   Yes [provider]  Trolamine Salicylate (ASPERCREME EX) Apply 1 application topically.   Yes [provider]  HYDROcodone-acetaminophen (NORCO) 5-325 MG tablet Take 1-2 tablets every 4 (four) hours as needed by mouth for severe pain. Patient not taking: Reported on 07/18/2017 07/13/17   Sandi Mealy E., PA-C  OLANZapine (ZYPREXA) 2.5 MG tablet Take 1 tablet (2.5 mg total) by  mouth at bedtime. Patient not taking: Reported on 07/29/2017 07/20/17   Harle Stanford., PA-C  omeprazole (PRILOSEC) 40 MG capsule TAKE 1 CAPSULE (40 MG TOTAL) BY MOUTH 2 (TWO) TIMES DAILY BEFORE A MEAL. 07/25/17   Pyrtle, Lajuan Lines, MD    Physical Exam: Vitals:   07/29/17 1640  BP: (!) 157/97  Pulse: 97  Resp: 18  Temp: 98.8 F (37.1 C)  TempSrc: Oral  SpO2: 100%  Weight: 55.3 kg (122 lb)  Height: 5\' 6"  (1.676 m)    Constitutional: Rocking back and forth/writhling in significant abdominal pain/discomfort Vitals:   07/29/17 1640  BP: (!) 157/97  Pulse: 97  Resp: 18  Temp: 98.8 F (37.1 C)  TempSrc: Oral  SpO2: 100%  Weight: 55.3 kg (122 lb)  Height: 5\' 6"  (1.676 m)   Eyes: PERRLA, lids and conjunctivae normal ENMT: Mucous membranes are dry. Posterior pharynx with some whitish exudates.   Neck: normal, supple, no masses, no thyromegaly Respiratory: clear to auscultation bilaterally, no wheezing, no crackles. Normal respiratory effort. No accessory muscle use.  Cardiovascular: Regular rate and  rhythm, no murmurs / rubs / gallops. No extremity edema. 2+ pedal pulses. No carotid bruits.  Abdomen: Soft, nondistended, no hepatosplenomegaly, no tenderness to palpation, decreased bowel sounds. Musculoskeletal: no clubbing / cyanosis. No joint deformity upper and lower extremities. Good ROM, no contractures. Normal muscle tone.  Skin: no rashes, lesions, ulcers. No induration Neurologic: CN 2-12 grossly intact. Sensation intact, DTR normal. Strength 5/5 in all 4.  Psychiatric: Normal judgment and insight. Alert and oriented x 3. Normal mood.   Labs on Admission: I have personally reviewed following labs and imaging studies  CBC: Recent Labs  Lab 07/25/17 1019 07/29/17 1540  WBC 3.8* 3.5*  NEUTROABS 2.0 1.5  HGB 11.2* 11.0*  HCT 34.0* 33.7*  MCV 87.0 87.3  PLT 173 604   Basic Metabolic Panel: Recent Labs  Lab 07/25/17 1019 07/29/17 1540  NA 137 142  K 3.5 3.8  CO2 29 23  GLUCOSE 89 74  BUN 9.3 4.2*  CREATININE 0.8 0.6  CALCIUM 9.2 8.7   GFR: Estimated Creatinine Clearance: 73.4 mL/min (by C-G formula based on SCr of 0.6 mg/dL). Liver Function Tests: Recent Labs  Lab 07/25/17 1019 07/29/17 1540  AST 22 19  ALT 14 12  ALKPHOS 31* 29*  BILITOT 0.33 0.39  PROT 6.7 6.3*  ALBUMIN 4.1 3.8   No results for input(s): LIPASE, AMYLASE in the last 168 hours. No results for input(s): AMMONIA in the last 168 hours. Coagulation Profile: No results for input(s): INR, PROTIME in the last 168 hours. Cardiac Enzymes: No results for input(s): CKTOTAL, CKMB, CKMBINDEX, TROPONINI in the last 168 hours. BNP (last 3 results) No results for input(s): PROBNP in the last 8760 hours. HbA1C: No results for input(s): HGBA1C in the last 72 hours. CBG: No results for input(s): GLUCAP in the last 168 hours. Lipid Profile: No results for input(s): CHOL, HDL, LDLCALC, TRIG, CHOLHDL, LDLDIRECT in the last 72 hours. Thyroid Function Tests: No results for input(s): TSH, T4TOTAL, FREET4,  T3FREE, THYROIDAB in the last 72 hours. Anemia Panel: No results for input(s): VITAMINB12, FOLATE, FERRITIN, TIBC, IRON, RETICCTPCT in the last 72 hours. Urine analysis:    Component Value Date/Time   COLORURINE YELLOW 09/08/2016 0200   APPEARANCEUR CLEAR 09/08/2016 0200   LABSPEC 1.030 05/19/2017 1438   PHURINE 6.0 05/19/2017 1438   PHURINE 7.5 09/08/2016 0200   GLUCOSEU Negative 05/19/2017 1438   HGBUR Trace  05/19/2017 1438   HGBUR NEGATIVE 09/08/2016 0200   HGBUR large 12/30/2009 1114   BILIRUBINUR Negative 05/19/2017 1438   KETONESUR 160 05/19/2017 1438   KETONESUR NEGATIVE 09/08/2016 0200   PROTEINUR 30 05/19/2017 1438   PROTEINUR NEGATIVE 09/08/2016 0200   UROBILINOGEN 0.2 05/19/2017 1438   NITRITE Negative 05/19/2017 1438   NITRITE NEGATIVE 09/08/2016 0200   LEUKOCYTESUR Trace 05/19/2017 1438    Radiological Exams on Admission: No results found.  EKG: None  Assessment/Plan Principal Problem:   Abdominal pain, acute, left upper quadrant Active Problems:   Acute neoplasm-related pain   TIA (transient ischemic attack)   Hyperlipidemia LDL goal <70   Metastatic cholangiocarcinoma to lung ([place])   Anemia due to antineoplastic chemotherapy   Oral candidiasis   Dehydration   #1 acute left-sided abdominal pain Questionable etiology.  Patient with history of metastatic cholangiocarcinoma with chronic right-sided upper abdominal pain and chronic pain regimen presented with acute left-sided abdominal pain.  Pain has been ongoing for several hours constant and cramping in nature per patient.  Patient states has had 2 bowel movements today.  Patient seen in oncologist office and due to significant abdominal pain not relieved by several rounds of IV morphine patient was admitted for pain management.  Concerned that pain may be secondary to metastatic cholangiocarcinoma.  Will check a CT abdomen and pelvis for further evaluation and management.  Patient is currently afebrile.   Patient does not have a leukocytosis.  Will place patient on morphine 4 mg IV every 4 hours as needed pain.  Increase on regimen of OxyContin to 30 mg p.o. twice daily.  Continue home regimen of OxyContin 5-10 mg p.o. every 4 hours as needed breakthrough pain.  Follow.  Worsening abdominal pain despite IV morphine may need to switch to IV Dilaudid.  Will monitor for now.  2.  Dehydration IV fluids.  3.  Oral candidiasis Continue oral Diflucan.  Will place on nystatin swish and swallow.  4.  History of TIA Stable.  Continue aspirin for secondary stroke prophylaxis.  5.  Anemia Follow H&H.  6.  Gastroesophageal reflux disease PPI.  7.  Metastatic cholangiocarcinoma Per Oncology.  DVT prophylaxis: Lovenox Code Status: Full Family Communication: Updated patient.  No family at bedside. Disposition Plan: Likely home once pain is controlled and etiology of abdominal pain found. Consults called: Oncology following Admission status: Placed in observation.   Irine Seal MD [facility] Pager [phone]  If 7PM-7AM, please contact night-coverage [email or web address] Password [facility]  07/29/2017, 7:36 PM

## 2017-07-30 ENCOUNTER — Encounter (HOSPITAL_COMMUNITY): Payer: Self-pay | Admitting: Radiology

## 2017-07-30 ENCOUNTER — Other Ambulatory Visit (HOSPITAL_COMMUNITY): Payer: Self-pay | Admitting: *Deleted

## 2017-07-30 ENCOUNTER — Observation Stay (HOSPITAL_COMMUNITY): Payer: Federal, State, Local not specified - PPO

## 2017-07-30 DIAGNOSIS — R630 Anorexia: Secondary | ICD-10-CM | POA: Diagnosis not present

## 2017-07-30 DIAGNOSIS — B370 Candidal stomatitis: Secondary | ICD-10-CM | POA: Diagnosis present

## 2017-07-30 DIAGNOSIS — E860 Dehydration: Secondary | ICD-10-CM | POA: Diagnosis present

## 2017-07-30 DIAGNOSIS — C779 Secondary and unspecified malignant neoplasm of lymph node, unspecified: Secondary | ICD-10-CM

## 2017-07-30 DIAGNOSIS — D709 Neutropenia, unspecified: Secondary | ICD-10-CM | POA: Diagnosis present

## 2017-07-30 DIAGNOSIS — D6481 Anemia due to antineoplastic chemotherapy: Secondary | ICD-10-CM | POA: Diagnosis present

## 2017-07-30 DIAGNOSIS — Z808 Family history of malignant neoplasm of other organs or systems: Secondary | ICD-10-CM | POA: Diagnosis not present

## 2017-07-30 DIAGNOSIS — R1012 Left upper quadrant pain: Secondary | ICD-10-CM | POA: Diagnosis not present

## 2017-07-30 DIAGNOSIS — C7802 Secondary malignant neoplasm of left lung: Secondary | ICD-10-CM | POA: Diagnosis present

## 2017-07-30 DIAGNOSIS — C7800 Secondary malignant neoplasm of unspecified lung: Secondary | ICD-10-CM | POA: Diagnosis not present

## 2017-07-30 DIAGNOSIS — E876 Hypokalemia: Secondary | ICD-10-CM | POA: Diagnosis present

## 2017-07-30 DIAGNOSIS — Z681 Body mass index (BMI) 19 or less, adult: Secondary | ICD-10-CM | POA: Diagnosis not present

## 2017-07-30 DIAGNOSIS — N839 Noninflammatory disorder of ovary, fallopian tube and broad ligament, unspecified: Secondary | ICD-10-CM

## 2017-07-30 DIAGNOSIS — F0280 Dementia in other diseases classified elsewhere without behavioral disturbance: Secondary | ICD-10-CM | POA: Diagnosis present

## 2017-07-30 DIAGNOSIS — Z803 Family history of malignant neoplasm of breast: Secondary | ICD-10-CM | POA: Diagnosis not present

## 2017-07-30 DIAGNOSIS — Z800 Family history of malignant neoplasm of digestive organs: Secondary | ICD-10-CM | POA: Diagnosis not present

## 2017-07-30 DIAGNOSIS — C221 Intrahepatic bile duct carcinoma: Secondary | ICD-10-CM | POA: Diagnosis present

## 2017-07-30 DIAGNOSIS — G629 Polyneuropathy, unspecified: Secondary | ICD-10-CM | POA: Diagnosis present

## 2017-07-30 DIAGNOSIS — R110 Nausea: Secondary | ICD-10-CM | POA: Diagnosis not present

## 2017-07-30 DIAGNOSIS — K219 Gastro-esophageal reflux disease without esophagitis: Secondary | ICD-10-CM | POA: Diagnosis present

## 2017-07-30 DIAGNOSIS — G893 Neoplasm related pain (acute) (chronic): Secondary | ICD-10-CM | POA: Diagnosis present

## 2017-07-30 DIAGNOSIS — T451X5A Adverse effect of antineoplastic and immunosuppressive drugs, initial encounter: Secondary | ICD-10-CM | POA: Diagnosis present

## 2017-07-30 DIAGNOSIS — K5900 Constipation, unspecified: Secondary | ICD-10-CM | POA: Diagnosis present

## 2017-07-30 DIAGNOSIS — E43 Unspecified severe protein-calorie malnutrition: Secondary | ICD-10-CM | POA: Diagnosis present

## 2017-07-30 DIAGNOSIS — E785 Hyperlipidemia, unspecified: Secondary | ICD-10-CM | POA: Diagnosis present

## 2017-07-30 DIAGNOSIS — Z8673 Personal history of transient ischemic attack (TIA), and cerebral infarction without residual deficits: Secondary | ICD-10-CM | POA: Diagnosis not present

## 2017-07-30 LAB — COMPREHENSIVE METABOLIC PANEL
ALT: 12 U/L — ABNORMAL LOW (ref 14–54)
AST: 16 U/L (ref 15–41)
Albumin: 3.5 g/dL (ref 3.5–5.0)
Alkaline Phosphatase: 24 U/L — ABNORMAL LOW (ref 38–126)
Anion gap: 6 (ref 5–15)
BUN: <5 mg/dL — ABNORMAL LOW (ref 6–20)
CO2: 27 mmol/L (ref 22–32)
Calcium: 8.5 mg/dL — ABNORMAL LOW (ref 8.9–10.3)
Chloride: 107 mmol/L (ref 101–111)
Creatinine, Ser: 0.57 mg/dL (ref 0.44–1.00)
GFR calc Af Amer: >60 mL/min (ref >60)
GFR calc non Af Amer: >60 mL/min (ref >60)
Glucose, Bld: 81 mg/dL (ref 65–99)
Potassium: 3.4 mmol/L — ABNORMAL LOW (ref 3.5–5.1)
Sodium: 140 mmol/L (ref 135–145)
Total Bilirubin: 0.9 mg/dL (ref 0.3–1.2)
Total Protein: 5.8 g/dL — ABNORMAL LOW (ref 6.5–8.1)

## 2017-07-30 LAB — CBC
HCT: 31.3 % — ABNORMAL LOW (ref 36.0–46.0)
Hemoglobin: 10.1 g/dL — ABNORMAL LOW (ref 12.0–15.0)
MCH: 28.2 pg (ref 26.0–34.0)
MCHC: 32.3 g/dL (ref 30.0–36.0)
MCV: 87.4 fL (ref 78.0–100.0)
Platelets: 144 10*3/uL — ABNORMAL LOW (ref 150–400)
RBC: 3.58 MIL/uL — ABNORMAL LOW (ref 3.87–5.11)
RDW: 13.9 % (ref 11.5–15.5)
WBC: 3.9 10*3/uL — ABNORMAL LOW (ref 4.0–10.5)

## 2017-07-30 LAB — MAGNESIUM: Magnesium: 1.7 mg/dL (ref 1.7–2.4)

## 2017-07-30 LAB — LIPASE: Lipase: 27 U/L (ref 14–72)

## 2017-07-30 LAB — PHOSPHORUS: Phosphorus: 4.4 mg/dL (ref 2.5–4.6)

## 2017-07-30 LAB — AMYLASE: Amylase, Serum: 39 U/L (ref 31–124)

## 2017-07-30 LAB — TSH: TSH: 1.405 u[IU]/mL (ref 0.350–4.500)

## 2017-07-30 LAB — GLUCOSE, CAPILLARY: Glucose-Capillary: 73 mg/dL (ref 65–99)

## 2017-07-30 LAB — HIV ANTIBODY (ROUTINE TESTING W REFLEX): HIV Screen 4th Generation wRfx: NONREACTIVE

## 2017-07-30 MED ORDER — IOPAMIDOL (ISOVUE-300) INJECTION 61%
100.0000 mL | Freq: Once | INTRAVENOUS | Status: AC | PRN
  Administered 2017-07-30: 80 mL via INTRAVENOUS
  Administered 2017-07-30: 100 mL via INTRAVENOUS

## 2017-07-30 MED ORDER — MEGESTROL ACETATE 400 MG/10ML PO SUSP
400.0000 mg | Freq: Every day | ORAL | Status: DC
  Administered 2017-07-30 – 2017-08-01 (×3): 400 mg via ORAL
  Filled 2017-07-30 (×3): qty 10

## 2017-07-30 MED ORDER — SODIUM CHLORIDE 0.9 % IV SOLN
INTRAVENOUS | Status: DC
  Administered 2017-07-31: 11:00:00 via INTRAVENOUS

## 2017-07-30 MED ORDER — POTASSIUM CHLORIDE CRYS ER 20 MEQ PO TBCR
40.0000 meq | EXTENDED_RELEASE_TABLET | Freq: Once | ORAL | Status: AC
  Administered 2017-07-30: 40 meq via ORAL
  Filled 2017-07-30: qty 2

## 2017-07-30 MED ORDER — IOPAMIDOL (ISOVUE-300) INJECTION 61%: 30.0000 mL | Freq: Once | INTRAVENOUS | Status: DC | PRN

## 2017-07-30 MED ORDER — GI COCKTAIL ~~LOC~~: 30.0000 mL | Freq: Three times a day (TID) | ORAL | Status: DC | PRN

## 2017-07-30 MED ORDER — IOPAMIDOL (ISOVUE-300) INJECTION 61%
INTRAVENOUS | Status: AC
  Administered 2017-07-30: 30 mL
  Filled 2017-07-30: qty 30

## 2017-07-30 MED ORDER — IOPAMIDOL (ISOVUE-300) INJECTION 61%
INTRAVENOUS | Status: AC
  Administered 2017-07-30: 100 mL via INTRAVENOUS
  Filled 2017-07-30: qty 100

## 2017-07-30 MED ORDER — MAGNESIUM SULFATE 4 GM/100ML IV SOLN
4.0000 g | Freq: Once | INTRAVENOUS | Status: AC
  Administered 2017-07-30: 4 g via INTRAVENOUS
  Filled 2017-07-30: qty 100

## 2017-07-30 MED ORDER — OLANZAPINE 5 MG PO TABS
2.5000 mg | ORAL_TABLET | Freq: Every day | ORAL | Status: DC
  Administered 2017-07-30 – 2017-07-31 (×2): 2.5 mg via ORAL
  Filled 2017-07-30 (×2): qty 1

## 2017-07-30 MED ORDER — NYSTATIN 100000 UNIT/ML MT SUSP
5.0000 mL | Freq: Four times a day (QID) | OROMUCOSAL | Status: DC
  Administered 2017-07-30 – 2017-08-01 (×9): 500000 [IU] via ORAL
  Filled 2017-07-30 (×9): qty 5

## 2017-07-30 NOTE — Progress Notes (Signed)
Nancy [name]   DOB:[birth year]   PZ#:[id number]   EUM#:[id number]  Oncology follow-up   Subjective: Patient is well-known to me, under my care for her unresectable cholangiocarcinoma.  Her recent CT scan unfortunately showed new peritoneal carcinomatosis and a large ovarian cyst.  Her abdominal pain has been progressively getting worse.  She was admitted from my clinic yesterday to hospital for further pain management.  She received multiple doses of IV morphine, her pain finally improved and she had a good sleep last night.  However her pain is still quite severe when she changed positions, and has been requiring frequent IV morphine.  She also has very little appetite, and intermittent nausea.  Objective:  Vitals:   07/30/17 0537 07/30/17 1333  BP: 116/76 138/86  Pulse: 76 86  Resp: 14 16  Temp: 98.1 F (36.7 C) 99.2 F (37.3 C)  SpO2: 100% 100%    Body mass index is 19.69 kg/m. No intake or output data in the 24 hours ending 07/30/17 1650   Sclerae unicteric  Oropharynx clear  No peripheral adenopathy  Lungs clear -- no rales or rhonchi  Heart regular rate and rhythm  Abdomen soft, diffuse tenderness, especially at left side   MSK no focal spinal tenderness, no peripheral edema  Neuro nonfocal   CBG (last 3)  Recent Labs    07/30/17 0738  GLUCAP 73     Labs:  Lab Results  Component Value Date   WBC 3.9 (L) 07/30/2017   HGB 10.1 (L) 07/30/2017   HCT 31.3 (L) 07/30/2017   MCV 87.4 07/30/2017   PLT 144 (L) 07/30/2017   NEUTROABS 1.5 07/29/2017   CMP Latest Ref Rng & Units 07/30/2017 07/29/2017 07/25/2017  Glucose 65 - 99 mg/dL 81 74 89  BUN 6 - 20 mg/dL <5(L) 4.2(L) 9.3  Creatinine 0.44 - 1.00 mg/dL 0.57 0.6 0.8  Sodium 135 - 145 mmol/L 140 142 137  Potassium 3.5 - 5.1 mmol/L 3.4(L) 3.8 3.5  Chloride 101 - 111 mmol/L 107 - -  CO2 22 - 32 mmol/L 27 23 29   Calcium 8.9 - 10.3 mg/dL 8.5(L) 8.7 9.2  Total Protein 6.5 - 8.1 g/dL 5.8(L) 6.3(L) 6.7  Total Bilirubin  0.3 - 1.2 mg/dL 0.9 0.39 0.33  Alkaline Phos 38 - 126 U/L 24(L) 29(L) 31(L)  AST 15 - 41 U/L 16 19 22   ALT 14 - 54 U/L 12(L) 12 14     Urine Studies No results for input(s): UHGB, CRYS in the last 72 hours.  Invalid input(s): UACOL, UAPR, USPG, UPH, UTP, UGL, UKET, UBIL, UNIT, UROB, ULEU, UEPI, UWBC, URBC, UBAC, CAST, UCOM, BILUA  Basic Metabolic Panel: Recent Labs  Lab 07/25/17 1019 07/29/17 1540 07/30/17 0507  NA 137 142 140  K 3.5 3.8 3.4*  CL  --   --  107  CO2 29 23 27   GLUCOSE 89 74 81  BUN 9.3 4.2* <5*  CREATININE 0.8 0.6 0.57  CALCIUM 9.2 8.7 8.5*  MG  --   --  1.7  PHOS  --   --  4.4   GFR Estimated Creatinine Clearance: 73.4 mL/min (by C-G formula based on SCr of 0.57 mg/dL). Liver Function Tests: Recent Labs  Lab 07/25/17 1019 07/29/17 1540 07/30/17 0507  AST 22 19 16   ALT 14 12 12*  ALKPHOS 31* 29* 24*  BILITOT 0.33 0.39 0.9  PROT 6.7 6.3* 5.8*  ALBUMIN 4.1 3.8 3.5   Recent Labs  Lab 07/29/17 1540  LIPASE 27  AMYLASE 39   No results for input(s): AMMONIA in the last 168 hours. Coagulation profile No results for input(s): INR, PROTIME in the last 168 hours.  CBC: Recent Labs  Lab 07/25/17 1019 07/29/17 1540 07/30/17 0507  WBC 3.8* 3.5* 3.9*  NEUTROABS 2.0 1.5  --   HGB 11.2* 11.0* 10.1*  HCT 34.0* 33.7* 31.3*  MCV 87.0 87.3 87.4  PLT 173 163 144*   Cardiac Enzymes: No results for input(s): CKTOTAL, CKMB, CKMBINDEX, TROPONINI in the last 168 hours. BNP: Invalid input(s): POCBNP CBG: Recent Labs  Lab 07/30/17 0738  GLUCAP 73   D-Dimer No results for input(s): DDIMER in the last 72 hours. Hgb A1c No results for input(s): HGBA1C in the last 72 hours. Lipid Profile No results for input(s): CHOL, HDL, LDLCALC, TRIG, CHOLHDL, LDLDIRECT in the last 72 hours. Thyroid function studies Recent Labs    07/30/17 0507  TSH 1.405   Anemia work up No results for input(s): VITAMINB12, FOLATE, FERRITIN, TIBC, IRON, RETICCTPCT in the  last 72 hours. Microbiology No results found for this or any previous visit (from the past 240 hour(s)).    Studies:  Ct Abdomen Pelvis W Contrast  Result Date: 07/30/2017 CLINICAL DATA:  Left upper quadrant and left-sided abdominal pain. History of metastatic cholangiocarcinoma. EXAM: CT ABDOMEN AND PELVIS WITH CONTRAST TECHNIQUE: Multidetector CT imaging of the abdomen and pelvis was performed using the standard protocol following bolus administration of intravenous contrast. CONTRAST:  41mL ISOVUE-300 IOPAMIDOL (ISOVUE-300) INJECTION 61% COMPARISON:  07/20/2017 FINDINGS: Lower chest: Unremarkable. Hepatobiliary: Caudate lesion again identified in similar in appearance measuring 2.3 x 1.9 cm today compared to 2.4 x 2.0 cm previously. Anterior lesion at the falciform ligament is also stable measuring 2.4 x 0.8 cm today compared 2.5 x 0.9 cm previously. The segment VI lesion described on the previous study is not readily evident today. There is no evidence for gallstones, gallbladder wall thickening, or pericholecystic fluid. No intrahepatic or extrahepatic biliary dilation. Pancreas: No focal mass lesion. No dilatation of the main duct. No intraparenchymal cyst. No peripancreatic edema. Spleen: No splenomegaly. No focal mass lesion. Adrenals/Urinary Tract: No adrenal nodule or mass. Kidneys unremarkable. No evidence for hydroureter. The urinary bladder appears normal for the degree of distention. Stomach/Bowel: Stomach is distended. Duodenum is normally positioned as is the ligament of Treitz. No small bowel wall thickening. No small bowel dilatation. Colon is nondilated sigmoid colon is decompressed and may have mild diffuse wall thickening but this is similar to prior. Vascular/Lymphatic: There is abdominal aortic atherosclerosis without aneurysm. Confluent nodal conglomeration in the hepatoduodenal ligament/porta hepatis is similar to prior measuring 3.9 x 3.0 cm today compared to 3.8 x 3.1 cm  previously. This generates mass-effect on the main portal vein which remains patent. There is substantial attenuation of the left renal vein at the confluence to the IVC. Abnormal soft tissue nearly encases the right renal artery. No pelvic sidewall lymphadenopathy. Reproductive: The large cystic and solid lesion filling the cul-de-sac and left adnexal space is similar to prior. This lesion measures up to 13.1 cm craniocaudal length. Multiple calcified uterine fibroids are evident. Other: The abnormal soft tissues seen in the anterior left lower quadrant (image 59 series 2) is similar to prior there is trace intraperitoneal free fluid. Musculoskeletal: Bone windows reveal no worrisome lytic or sclerotic osseous lesions. IMPRESSION: 1. No substantial interval change in exam. 2. Complex cystic and solid mass filling the cul-de-sac and left adnexal space is similar to prior. There is abnormal  soft tissue in the anterior left lower quadrant concerning for metastatic disease. 3. Stable appearance of the caudate lesion in this patient with known cholangiocarcinoma. There is confluent necrotic lymphadenopathy in the porta hepatis, similar to prior with substantial mass-effect on the left renal vein as it enters the IVC. 4. Fibroid change in the uterus. 5. Trace intraperitoneal free fluid. Electronically Signed   By: Misty Stanley M.D.   On: 07/30/2017 13:44    Assessment: 51 y.o.African-American female, without significant past medical history, presented with abdominal pain  1. Metastatic intrahepatic cholangiocarcinoma, with probable node and lung metastasis, [id number], stage IV 2.  Intractable abdominal pain, secondary to cancer progression 3. Nausea and anorexia and weight loss  4. Neutropenia and anemia 5. Constipation 6. Oral thrush  7. Peripheral neuropathy, grade 1 8.  Severe protein and calorie malnutrition   Plan:  -I will consult palliative care for her pain and other symptom management, I spoke  with Dr. Rowe Pavy today  -repeat CT abdomen with contrast is pending  -I have scheduled her omental biopsy by IR on 12/7, will contact IR on Monday to see if they can do the biopsy when she is in the hospital  -she is scheduled to see GYN oncology on 12/14 for her large ovarian cystic lesion, will check with them to see if they can see her in the hospital  -I suggest her to try Megace for her anorexia, she agrees, I ordered -Olanzapine 2.5 mg at bedtime, for nausea and low appetite  -continue supportive care  -I will f/u when she is in the hospital -I greatly appreciate hospitalist team's excellent care    Truitt Merle, MD 07/30/2017  1pm

## 2017-07-30 NOTE — Evaluation (Signed)
Physical Therapy Evaluation Patient Details Name: Nancy [name] MRN: [id number] DOB: 1966/06/16 Today's Date: 07/30/2017   History of Present Illness  Nancy [name] is a 51 y.o. female with medical history significant of gastroesophageal disease, metastatic cholangiocarcinoma diagnosed in November 2017, hyperlipidemia, seasonal allergies, migraines presented in follow-up by her oncologist office secondary to dehydration and worsening abdominal pain.    Clinical Impression  Patient evaluated by Physical Therapy with no further acute PT needs identified. All education has been completed and the patient has no further questions. Pt mod I with transfers and ambulation, walked 500' and was able to change gait speed as well as perform bkwd walking without LOB. Pt active at home when pain allows. No acute needs at this time, ambulate in hallway as tolerated.  See below for any follow-up Physical Therapy or equipment needs. PT is signing off. Thank you for this referral.     Follow Up Recommendations No PT follow up    Equipment Recommendations  None recommended by PT    Recommendations for Other Services       Precautions / Restrictions Precautions Precautions: None Restrictions Weight Bearing Restrictions: No      Mobility  Bed Mobility Overal bed mobility: Independent                Transfers Overall transfer level: Independent                  Ambulation/Gait Ambulation/Gait assistance: Modified independent (Device/Increase time) Ambulation Distance (Feet): 500 Feet Assistive device: None Gait Pattern/deviations: WFL(Within Functional Limits) Gait velocity: WFL Gait velocity interpretation: at or above normal speed for age/gender General Gait Details: pt began with decreased speed and slight guarding but increased pace and fluidity with distance  Stairs            Wheelchair Mobility    Modified Rankin (Stroke Patients Only)       Balance  Overall balance assessment: No apparent balance deficits (not formally assessed)                                           Pertinent Vitals/Pain Pain Assessment: Faces Faces Pain Scale: Hurts a little bit Pain Location: L upper quadrant pain Pain Descriptors / Indicators: Aching Pain Intervention(s): Limited activity within patient's tolerance;Premedicated before session    Home Living Family/patient expects to be discharged to:: Private residence Living Arrangements: Spouse/significant other Available Help at Discharge: Family;Available PRN/intermittently Type of Home: House Home Access: Level entry     Home Layout: Two level Home Equipment: None Additional Comments: pt about to move to house that is 2 story but has Restaurant manager, fast food on main.     Prior Function Level of Independence: Independent         Comments: prior to abdominal pain was working from home for [employer] as well as lifting some wts and walking 2-4 mi. In last month she has been unable to do this due to abdominal pain and the need for narcotic pain meds.      Hand Dominance        Extremity/Trunk Assessment   Upper Extremity Assessment Upper Extremity Assessment: Overall WFL for tasks assessed    Lower Extremity Assessment Lower Extremity Assessment: RLE deficits/detail RLE Deficits / Details: 4+/5 throughout, LLE same    Cervical / Trunk Assessment Cervical / Trunk Assessment: Normal  Communication   Communication: No  difficulties  Cognition Arousal/Alertness: Awake/alert Behavior During Therapy: WFL for tasks assessed/performed Overall Cognitive Status: Within Functional Limits for tasks assessed                                        General Comments General comments (skin integrity, edema, etc.): discussed appropriate activity level and walking distance when pt is able    Exercises     Assessment/Plan    PT Assessment Patent does not need any further  PT services  PT Problem List         PT Treatment Interventions      PT Goals (Current goals can be found in the Care Plan section)  Acute Rehab PT Goals Patient Stated Goal: return home, no pain PT Goal Formulation: All assessment and education complete, DC therapy    Frequency     Barriers to discharge        Co-evaluation               AM-PAC PT "6 Clicks" Daily Activity  Outcome Measure Difficulty turning over in bed (including adjusting bedclothes, sheets and blankets)?: A Little Difficulty moving from lying on back to sitting on the side of the bed? : A Little Difficulty sitting down on and standing up from a chair with arms (e.g., wheelchair, bedside commode, etc,.)?: None Help needed moving to and from a bed to chair (including a wheelchair)?: None Help needed walking in hospital room?: None Help needed climbing 3-5 steps with a railing? : None 6 Click Score: 22    End of Session   Activity Tolerance: Patient tolerated treatment well Patient left: in chair;with call bell/phone within reach Nurse Communication: Mobility status PT Visit Diagnosis: Muscle weakness (generalized) (M62.81)    Time: 2683-4196 PT Time Calculation (min) (ACUTE ONLY): 20 min   Charges:   PT Evaluation $PT Eval Low Complexity: 1 Low     PT G Codes:   PT G-Codes **NOT FOR INPATIENT CLASS** Functional Assessment Tool Used: AM-PAC 6 Clicks Basic Mobility Functional Limitation: Mobility: Walking and moving around Mobility: Walking and Moving Around Current Status (Q2297): At least 20 percent but less than 40 percent impaired, limited or restricted Mobility: Walking and Moving Around Goal Status [phone]): At least 20 percent but less than 40 percent impaired, limited or restricted Mobility: Walking and Moving Around Discharge Status [phone]): At least 20 percent but less than 40 percent impaired, limited or restricted    [place], [place]   [place] 07/30/2017, 3:28 PM

## 2017-07-30 NOTE — Progress Notes (Signed)
PROGRESS NOTE    Nancy [name]  NTI:[id number] DOB: Nov 07, 1965 DOA: 07/29/2017 PCP: [name], DO    Brief Narrative:  Patient is a pleasant 51 year old female unfortunate with history of metastatic cholangiocarcinoma diagnosed November 2017, history of gastroesophageal reflux disease, hyperlipidemia, migraine headaches presenting from oncologist office secondary to dehydration and worsening abdominal pain.  Patient being hydrated with IV fluids, pain management.  CT abdomen and pelvis ordered and pending.  Oncology following.   Assessment & Plan:   Principal Problem:   Abdominal pain, acute, left upper quadrant Active Problems:   Acute neoplasm-related pain   TIA (transient ischemic attack)   Hyperlipidemia LDL goal <70   Metastatic cholangiocarcinoma to lung ([place])   Anemia due to antineoplastic chemotherapy   Oral candidiasis   Dehydration  #1 acute left-sided abdominal pain Questionable etiology.  Patient with history of metastatic cholangiocarcinoma with chronic right-sided upper abdominal pain and chronic pain regimen presented with acute left-sided abdominal pain.  Patient with significant cramping pain on admission and patient noted to be having bowel movements.  The patient's long-acting OxyContin was increased to 30 mg twice daily.  Patient currently on oxycodone 5-10 mg every 4 hours as needed for breakthrough pain.  Patient also noted on morphine 4 mg IV every 2 hours as needed for breakthrough pain.  CT abdomen and pelvis pending.  Patient with some improvement with pain management at this time.  IV fluids.  Supportive care.  Oncology following.  2.  Dehydration IV fluids.  3.  Oral candidiasis Continue nystatin swish and swallow as well as oral Diflucan.  4.  History of TIA Stable.  Continue aspirin for secondary stroke prophylaxis.  5.  Anemia Hemoglobin currently at 10.1.  No overt bleeding.  Follow.  6.  Hypokalemia Replete.  Keep magnesium  greater than 2.  7.  Gastroesophageal reflux disease.  Continue PPI.  GI cocktail as needed.  8.  Metastatic cholangiocarcinoma Per oncology.   DVT prophylaxis: Lovenox Code Status: Full Family Communication: Dated patient and husband at bedside. Disposition Plan: Likely home once acute medical issues have resolved and pending PT evaluation.   Consultants:   Oncology: [facility] 07/29/2017  Procedures:   CT abdomen and pelvis pending 07/30/2017    Antimicrobials:   None   Subjective: She laying in bed.  Recently received pain medication.  Patient states pain is currently better controlled at this time.  No nausea or emesis.  Chest pain.  No shortness of breath.  Awaiting CT abdomen and pelvis.  Objective: Vitals:   07/29/17 1640 07/29/17 2109 07/30/17 0537  BP: (!) 157/97 (!) 156/88 116/76  Pulse: 97 87 76  Resp: 18 16 14   Temp: 98.8 F (37.1 C) 99.2 F (37.3 C) 98.1 F (36.7 C)  TempSrc: Oral Oral Oral  SpO2: 100% 100% 100%  Weight: 55.3 kg (122 lb)    Height: 5\' 6"  (1.676 m)     No intake or output data in the 24 hours ending 07/30/17 1123 Filed Weights   07/29/17 1640  Weight: 55.3 kg (122 lb)    Examination:  General exam: Appears calm and comfortable  Respiratory system: Clear to auscultation. Respiratory effort normal. Cardiovascular system: S1 & S2 heard, RRR. No JVD, murmurs, rubs, gallops or clicks. No pedal edema. Gastrointestinal system: Abdomen is nondistended, soft and nontender. No organomegaly or masses felt. Normal bowel sounds heard. Central nervous system: Alert and oriented. No focal neurological deficits. Extremities: Symmetric 5 x 5 power. Skin: No rashes,  lesions or ulcers Psychiatry: Judgement and insight appear normal. Mood & affect appropriate.     Data Reviewed: I have personally reviewed following labs and imaging studies  CBC: Recent Labs  Lab 07/25/17 1019 07/29/17 1540 07/30/17 0507  WBC 3.8* 3.5* 3.9*  NEUTROABS  2.0 1.5  --   HGB 11.2* 11.0* 10.1*  HCT 34.0* 33.7* 31.3*  MCV 87.0 87.3 87.4  PLT 173 163 657*   Basic Metabolic Panel: Recent Labs  Lab 07/25/17 1019 07/29/17 1540 07/30/17 0507  NA 137 142 140  K 3.5 3.8 3.4*  CL  --   --  107  CO2 29 23 27   GLUCOSE 89 74 81  BUN 9.3 4.2* <5*  CREATININE 0.8 0.6 0.57  CALCIUM 9.2 8.7 8.5*  MG  --   --  1.7  PHOS  --   --  4.4   GFR: Estimated Creatinine Clearance: 73.4 mL/min (by C-G formula based on SCr of 0.57 mg/dL). Liver Function Tests: Recent Labs  Lab 07/25/17 1019 07/29/17 1540 07/30/17 0507  AST 22 19 16   ALT 14 12 12*  ALKPHOS 31* 29* 24*  BILITOT 0.33 0.39 0.9  PROT 6.7 6.3* 5.8*  ALBUMIN 4.1 3.8 3.5   Recent Labs  Lab 07/29/17 1540  LIPASE 27  AMYLASE 39   No results for input(s): AMMONIA in the last 168 hours. Coagulation Profile: No results for input(s): INR, PROTIME in the last 168 hours. Cardiac Enzymes: No results for input(s): CKTOTAL, CKMB, CKMBINDEX, TROPONINI in the last 168 hours. BNP (last 3 results) No results for input(s): PROBNP in the last 8760 hours. HbA1C: No results for input(s): HGBA1C in the last 72 hours. CBG: Recent Labs  Lab 07/30/17 0738  GLUCAP 73   Lipid Profile: No results for input(s): CHOL, HDL, LDLCALC, TRIG, CHOLHDL, LDLDIRECT in the last 72 hours. Thyroid Function Tests: Recent Labs    07/30/17 0507  TSH 1.405   Anemia Panel: No results for input(s): VITAMINB12, FOLATE, FERRITIN, TIBC, IRON, RETICCTPCT in the last 72 hours. Sepsis Labs: No results for input(s): PROCALCITON, LATICACIDVEN in the last 168 hours.  No results found for this or any previous visit (from the past 240 hour(s)).       Radiology Studies: No results found.      Scheduled Meds: . aspirin  81 mg Oral Daily  . cholecalciferol  5,000 Units Oral Daily  . dronabinol  5 mg Oral BID AC  . enoxaparin (LOVENOX) injection  40 mg Subcutaneous Q24H  . fluconazole  100 mg Oral Daily  .  loratadine  10 mg Oral Daily  . oxyCODONE  30 mg Oral Q12H  . pantoprazole  40 mg Oral BID AC  . scopolamine  1 patch Transdermal Q72H  . senna  1 tablet Oral BID   Continuous Infusions: . [START ON 07/31/2017] sodium chloride    . magnesium sulfate 1 - 4 g bolus IVPB 4 g (07/30/17 0931)     LOS: 1 day    Time spent: 35 mins    Irine Seal, MD [facility] Pager [phone]  If 7PM-7AM, please contact night-coverage [email or web address] Password [email or web address] 07/30/2017, 11:23 AM

## 2017-07-31 LAB — URINE CULTURE: Culture: <10000 — AB

## 2017-07-31 LAB — CBC
HCT: 29.7 % — ABNORMAL LOW (ref 36.0–46.0)
Hemoglobin: 9.8 g/dL — ABNORMAL LOW (ref 12.0–15.0)
MCH: 28.8 pg (ref 26.0–34.0)
MCHC: 33 g/dL (ref 30.0–36.0)
MCV: 87.4 fL (ref 78.0–100.0)
Platelets: 142 10*3/uL — ABNORMAL LOW (ref 150–400)
RBC: 3.4 MIL/uL — ABNORMAL LOW (ref 3.87–5.11)
RDW: 14 % (ref 11.5–15.5)
WBC: 3.6 10*3/uL — ABNORMAL LOW (ref 4.0–10.5)

## 2017-07-31 LAB — BASIC METABOLIC PANEL
Anion gap: 6 (ref 5–15)
BUN: 8 mg/dL (ref 6–20)
CO2: 28 mmol/L (ref 22–32)
Calcium: 8.6 mg/dL — ABNORMAL LOW (ref 8.9–10.3)
Chloride: 106 mmol/L (ref 101–111)
Creatinine, Ser: 0.7 mg/dL (ref 0.44–1.00)
GFR calc Af Amer: >60 mL/min (ref >60)
GFR calc non Af Amer: >60 mL/min (ref >60)
Glucose, Bld: 124 mg/dL — ABNORMAL HIGH (ref 65–99)
Potassium: 3.8 mmol/L (ref 3.5–5.1)
Sodium: 140 mmol/L (ref 135–145)

## 2017-07-31 LAB — MAGNESIUM: Magnesium: 2.3 mg/dL (ref 1.7–2.4)

## 2017-07-31 MED ORDER — MAGNESIUM CITRATE PO SOLN: 1.0000 | Freq: Every day | ORAL | Status: DC | PRN

## 2017-07-31 MED ORDER — BISACODYL 10 MG RE SUPP: 10.0000 mg | Freq: Every day | RECTAL | Status: DC | PRN

## 2017-07-31 MED ORDER — PANTOPRAZOLE SODIUM 40 MG PO TBEC
40.0000 mg | DELAYED_RELEASE_TABLET | Freq: Two times a day (BID) | ORAL | Status: DC
  Administered 2017-07-31 – 2017-08-01 (×3): 40 mg via ORAL
  Filled 2017-07-31 (×3): qty 1

## 2017-07-31 MED ORDER — PANTOPRAZOLE SODIUM 40 MG PO TBEC: 40.0000 mg | DELAYED_RELEASE_TABLET | Freq: Every day | ORAL | Status: DC

## 2017-07-31 MED ORDER — OXYCODONE HCL ER 10 MG PO T12A
10.0000 mg | EXTENDED_RELEASE_TABLET | Freq: Once | ORAL | Status: AC
  Administered 2017-07-31: 10 mg via ORAL
  Filled 2017-07-31: qty 1

## 2017-07-31 MED ORDER — HYDROMORPHONE HCL 4 MG PO TABS
2.0000 mg | ORAL_TABLET | ORAL | Status: DC | PRN
  Administered 2017-07-31 – 2017-08-01 (×4): 2 mg via ORAL
  Filled 2017-07-31 (×4): qty 1

## 2017-07-31 MED ORDER — SENNA 8.6 MG PO TABS
2.0000 | ORAL_TABLET | Freq: Two times a day (BID) | ORAL | Status: DC
  Administered 2017-07-31 – 2017-08-01 (×2): 17.2 mg via ORAL
  Filled 2017-07-31 (×2): qty 2

## 2017-07-31 MED ORDER — OXYCODONE HCL ER 40 MG PO T12A
40.0000 mg | EXTENDED_RELEASE_TABLET | Freq: Two times a day (BID) | ORAL | Status: DC
  Administered 2017-07-31 – 2017-08-01 (×2): 40 mg via ORAL
  Filled 2017-07-31 (×2): qty 1

## 2017-07-31 MED ORDER — SCOPOLAMINE 1 MG/3DAYS TD PT72
1.0000 | MEDICATED_PATCH | TRANSDERMAL | Status: DC
  Administered 2017-07-31: 1.5 mg via TRANSDERMAL
  Filled 2017-07-31: qty 1

## 2017-07-31 NOTE — Consult Note (Addendum)
Consultation Note Date: 07/31/2017   Patient Name: Nancy [name]  DOB: 09/01/1965  MRN: [id number]  Age / Sex: 51 y.o., female  PCP: Nancy [name] Referring Physician: Eugenie Filler, [name]  Reason for Consultation: Non pain symptom management and Pain control  HPI/Patient Profile: 51 y.o. female  admitted on 07/29/2017   Nancy [name] is a 51 year old lady, originally from [place], has lived in several states, currently lives locally with her husband, has been diagnosed with unresectable cholangiocarcinoma with recent imaging CT scan showing new peritoneal carcinomatosis and large ovarian cyst, admitted from medical oncology clinic for pain management, required IV morphine, also had mild constipation, decreased appetite and intermittent nausea.  A palliative consultation has been requested for pain and non-pain symptom management as well as early any sedation of goals of care discussions.  Nancy [name] is a pleasant lady resting in bed. She denies any pain. She denies any nausea. She ate almost 80-90 percent of her breakfast. She slept well and is having a good morning. She wants to go home. Her current pain regimen is reviewed and agree with scheduled long-acting pain medicine as well as when necessary opioids. She is also on anti-emetic and bowel regimen. Her last bowel movement was 3-4 days ago. It is normal for her to go every other day. At home, she states she has over-the-counter laxatives that she sometimes has had to use for relief from constipation. At present she denies abdominal discomfort.  Patient states she knows she is up against a very serious illness. She wishes to remain positive. She has follow-up scheduled with her oncologist Nancy [name] within a few days after discharge. I offered active listening and supportive care and endorsed her desire to maintain hope.     NEXT OF KIN    husband, she is originally from [place].   SUMMARY OF RECOMMENDATIONS    Pain regimen: Agree with current pain regimen of scheduled oxycodone as well as as needed oxycodone when necessary.  Non pain regimen: Agree with current bowel and antiemetic regimen. Will add Dulcolax suppository for occasional relief from constipation as patient escalates her opioid use to avoid further worsening of constipation.  Continue IV when necessary morphine when the patient is hospitalized for breakthrough pain.  Agree with nystatin and oral Diflucan for candidiasis.  Goals of care: Remains full code for now. Is eager for discharge home. Will follow-up with medical oncologist soon after discharge.  Code Status/Advance Care Planning:  Full code    Symptom Management:    as above   Palliative Prophylaxis:   Bowel Regimen  Additional Recommendations (Limitations, Scope, Preferences):  Full Scope Treatment  Psycho-social/Spiritual:   Desire for further Chaplaincy support:no  Additional Recommendations: Caregiving  Support/Resources  Prognosis:   Unable to determine  Discharge Planning: Home with Home Health      Primary Diagnoses: Present on Admission: . Acute neoplasm-related pain . Abdominal pain, acute, left upper quadrant . TIA (transient ischemic attack) . Hyperlipidemia LDL goal <70 . Metastatic cholangiocarcinoma to lung (  [facility]) . Oral candidiasis . Anemia due to antineoplastic chemotherapy . Dehydration   I have reviewed the medical record, interviewed the patient and family, and examined the patient. The following aspects are pertinent.  Past Medical History:  Diagnosis Date  . Allergy    SEASONAL  . Anemia    HIGH SCHOOL  . Atopic eczema   . cholangio ca dx'd 07/12/16  . Fibrocystic breast disease   . GERD (gastroesophageal reflux disease)   . Hx of migraines    seasonal   . Pneumonia    6 years ago   . TIA (transient ischemic attack)    Social  History   Socioeconomic History  . Marital status: Married    Spouse name: None  . Number of children: 0  . Years of education: 28  . Highest education level: None  Social Needs  . Financial resource strain: None  . Food insecurity - worry: None  . Food insecurity - inability: None  . Transportation needs - medical: None  . Transportation needs - non-medical: None  Occupational History  . Occupation: HR fml[employer]    Employer: [employer]    Employer: [employer]  Tobacco Use  . Smoking status: Never Smoker  . Smokeless tobacco: Never Used  Substance and Sexual Activity  . Alcohol use: Yes    Comment: maybe 3 times a year  . Drug use: No  . Sexual activity: Yes    Partners: Male  Other Topics Concern  . None  Social History Narrative  . None   Family History  Problem Relation Age of Onset  . Gout Mother   . Diabetes Mother   . Hypertension Father   . Diabetes Father   . Breast cancer Paternal Aunt   . Cancer Maternal Grandfather        throat cancer   . Cancer Cousin 58       GI cancer   . Thyroid disease Neg Hx    Scheduled Meds: . aspirin  81 mg Oral Daily  . cholecalciferol  5,000 Units Oral Daily  . dronabinol  5 mg Oral BID AC  . enoxaparin (LOVENOX) injection  40 mg Subcutaneous Q24H  . fluconazole  100 mg Oral Daily  . loratadine  10 mg Oral Daily  . megestrol  400 mg Oral Daily  . nystatin  5 mL Oral QID  . OLANZapine  2.5 mg Oral QHS  . oxyCODONE  30 mg Oral Q12H  . pantoprazole  40 mg Oral BID AC  . scopolamine  1 patch Transdermal Q72H  . senna  1 tablet Oral BID   Continuous Infusions: . sodium chloride     PRN Meds:.acetaminophen **OR** acetaminophen, ALPRAZolam, bisacodyl, dicyclomine, gi cocktail, iopamidol, ipratropium-albuterol, morphine injection, ondansetron **OR** ondansetron (ZOFRAN) IV, oxyCODONE, polyethylene glycol, sorbitol Medications Prior to Admission:  Prior to Admission medications   Medication Sig Start Date End Date Taking?  Authorizing Provider  ALPRAZolam Duanne Moron) 0.5 MG tablet Take 1 tablet (0.5 mg total) by mouth as needed for anxiety. 06/23/16  Yes Pyrtle, Lajuan Lines, [name]  aspirin 81 MG tablet Take 81 mg by mouth daily.   Yes Provider, Historical, [name]  cetirizine (ZYRTEC) 10 MG tablet Take 10 mg by mouth daily as needed. For allergies    Yes Provider, Historical, [name]  Cholecalciferol (VITAMIN D3) 5000 UNITS CAPS Take 1 capsule by mouth daily.   Yes Provider, Historical, [name]  COLOSTRUM PO Take 1.25 g by mouth daily. 1.25 grams twice daily  Yes Provider, Historical, [name]  dicyclomine (BENTYL) 20 MG tablet 3 (three) times daily as needed.  07/22/16  Yes Provider, Historical, [name]  dronabinol (MARINOL) 5 MG capsule Take 1 capsule (5 mg total) by mouth 2 (two) times daily before a meal. 06/08/17  Yes Owens Shark, NP  fluconazole (DIFLUCAN) 100 MG tablet Take 1 tablet (100 mg total) by mouth daily. 07/28/17  Yes Truitt Merle, [name]  lidocaine-prilocaine (EMLA) cream Apply 1 application topically as needed. Apply to portacath 1 1/2 hours - 2 hours prior to procedures as needed. 07/27/17  Yes Truitt Merle, [name]  LORazepam (ATIVAN) 0.5 MG tablet Place under tongue and dissolve every 8 hours as needed for nausea. [name] not take Xanax while taking this medication. 05/18/17  Yes Tanner, Lyndon Code., PA-C  magnesium hydroxide (MILK OF MAGNESIA) 400 MG/5ML suspension Take 30 mLs by mouth daily as needed for mild constipation.   Yes Provider, Historical, [name]  Menaquinone-7 (VITAMIN K2) 40 MCG TABS Take by mouth.   Yes Provider, Historical, [name]  mometasone (ELOCON) 0.1 % lotion Apply topically daily. 08/21/15  Yes [name], [name]  omeprazole (PRILOSEC) 40 MG capsule Take 1 capsule (40 mg total) by mouth 2 (two) times daily before a meal. 07/22/17  Yes Truitt Merle, [name]  ondansetron (ZOFRAN) 8 MG tablet Take 1 tablet (8 mg total) by mouth every 8 (eight) hours as needed for nausea or vomiting. 07/25/17  Yes Alla Feeling, NP  oxyCODONE (OXY  IR/ROXICODONE) 5 MG immediate release tablet Take 1-2 tablets (5-10 mg total) every 4 (four) hours as needed by mouth for severe pain. 07/18/17  Yes Maryanna Shape, NP  oxyCODONE (OXYCONTIN) 20 mg 12 hr tablet Take 1 tablet (20 mg total) every 12 (twelve) hours by mouth. 07/13/17  Yes Tanner, Lyndon Code., PA-C  promethazine (PHENERGAN) 25 MG tablet Take 1 tablet (25 mg total) by mouth every 6 (six) hours as needed for nausea or vomiting. 05/18/17  Yes Tanner, Lyndon Code., PA-C  scopolamine (TRANSDERM-SCOP) 1 MG/3DAYS Place 1 patch (1.5 mg total) every 3 (three) days onto the skin. 07/13/17  Yes Tanner, Lyndon Code., PA-C  triamcinolone cream (KENALOG) 0.1 % Apply topically.   Yes Provider, Historical, [name]  Trolamine Salicylate (ASPERCREME EX) Apply 1 application topically.   Yes Provider, Historical, [name]  HYDROcodone-acetaminophen (NORCO) 5-325 MG tablet Take 1-2 tablets every 4 (four) hours as needed by mouth for severe pain. Patient not taking: Reported on 07/18/2017 07/13/17   Sandi Mealy E., PA-C  OLANZapine (ZYPREXA) 2.5 MG tablet Take 1 tablet (2.5 mg total) by mouth at bedtime. Patient not taking: Reported on 07/29/2017 07/20/17   Harle Stanford., PA-C  omeprazole (PRILOSEC) 40 MG capsule TAKE 1 CAPSULE (40 MG TOTAL) BY MOUTH 2 (TWO) TIMES DAILY BEFORE A MEAL. 07/25/17   Pyrtle, Lajuan Lines, [name]   Allergies  Allergen Reactions  . Penicillins Anaphylaxis    Has patient had a PCN reaction causing immediate rash, facial/tongue/throat swelling, SOB or lightheadedness with hypotensionYes Swelling in throat  Has patient had a PCN reaction causing severe rash involving mucus membranes or skin necrosis:/No Has patient had a PCN reaction that required hospitalization/No Has patient had a PCN reaction occurring within the last 10 years:NO If all of the above answers are "NO", then may proceed with Cephalosporin use.   . Lipitor [Atorvastatin] Palpitations    Tingling, flushing   Review of Systems Posterior for  abdominal pain none currently Physical Exam Awake alert sitting up  in bed S1-S2 Clear Abdomen mildly distended mild diffuse generalized tenderness on palpation No edema Thin, muscle wasting Awake alert nonfocal  Vital Signs: BP 106/68 (BP Location: Left Arm)   Pulse 68   Temp 98.9 F (37.2 C) (Oral)   Resp 20   Ht 5\' 6"  (9.983 m)   Wt 55.3 kg (122 lb)   SpO2 97%   BMI 19.69 kg/m  Pain Assessment: No/denies pain   Pain Score: 0-No pain   SpO2: SpO2: 97 % O2 Device:SpO2: 97 % O2 Flow Rate: .   IO: Intake/output summary:   Intake/Output Summary (Last 24 hours) at 07/31/2017 3825 Last data filed at 07/30/2017 1700 Gross per 24 hour  Intake 480 ml  Output -  Net 480 ml    LBM: Last BM Date: 07/29/17 Baseline Weight: Weight: 55.3 kg (122 lb) Most recent weight: Weight: 55.3 kg (122 lb)     Palliative Assessment/Data:    palliative performance scale 40%  Time In:  8:30 Time Out:  9:40 Time Total:  70 Greater than 50%  of this time was spent counseling and coordinating care related to the above assessment and plan.  Signed by: Loistine Chance, [name]  [phone] Please contact Palliative Medicine Team phone at [phone] for questions and concerns.  For individual provider: See Shea Evans

## 2017-07-31 NOTE — Progress Notes (Signed)
Nancy [name]   DOB:1966/03/18   YD#:[id number]   EHM#:[id number]  Oncology follow-up   Subjective: Nancy [name] slept well last night, her pain overall is controlled, but she did require multiple (4) doses of IV morphine, and she feels she would need one dose when I saw her this morning.  Her nausea overall is also better, she has been eating a little bit more.  She tried Megace, which did seem to be beneficial.  She would like to continue.  She is eager to go home today.  Objective:  Vitals:   07/30/17 2020 07/31/17 0538  BP: 117/70 106/68  Pulse: 83 68  Resp: 16 20  Temp: 98.6 F (37 C) 98.9 F (37.2 C)  SpO2: 99% 97%    Body mass index is 19.69 kg/m.  Intake/Output Summary (Last 24 hours) at 07/31/2017 1109 Last data filed at 07/30/2017 1700 Gross per 24 hour  Intake 480 ml  Output -  Net 480 ml     Sclerae unicteric  Oropharynx clear  No peripheral adenopathy  Lungs clear -- no rales or rhonchi  Heart regular rate and rhythm  Abdomen soft, diffuse tenderness, especially at left side   MSK no focal spinal tenderness, no peripheral edema  Neuro nonfocal   CBG (last 3)  Recent Labs    07/30/17 0738  GLUCAP 73     Labs:  Lab Results  Component Value Date   WBC 3.6 (L) 07/31/2017   HGB 9.8 (L) 07/31/2017   HCT 29.7 (L) 07/31/2017   MCV 87.4 07/31/2017   PLT 142 (L) 07/31/2017   NEUTROABS 1.5 07/29/2017   CMP Latest Ref Rng & Units 07/31/2017 07/30/2017 07/29/2017  Glucose 65 - 99 mg/dL 124(H) 81 74  BUN 6 - 20 mg/dL 8 <5(L) 4.2(L)  Creatinine 0.44 - 1.00 mg/dL 0.70 0.57 0.6  Sodium 135 - 145 mmol/L 140 140 142  Potassium 3.5 - 5.1 mmol/L 3.8 3.4(L) 3.8  Chloride 101 - 111 mmol/L 106 107 -  CO2 22 - 32 mmol/L 28 27 23   Calcium 8.9 - 10.3 mg/dL 8.6(L) 8.5(L) 8.7  Total Protein 6.5 - 8.1 g/dL - 5.8(L) 6.3(L)  Total Bilirubin 0.3 - 1.2 mg/dL - 0.9 0.39  Alkaline Phos 38 - 126 U/L - 24(L) 29(L)  AST 15 - 41 U/L - 16 19  ALT 14 - 54 U/L - 12(L) 12     Urine  Studies No results for input(s): UHGB, CRYS in the last 72 hours.  Invalid input(s): UACOL, UAPR, USPG, UPH, UTP, UGL, UKET, UBIL, UNIT, UROB, ULEU, UEPI, UWBC, URBC, UBAC, CAST, UCOM, [place]  Basic Metabolic Panel: Recent Labs  Lab 07/25/17 1019 07/29/17 1540 07/30/17 0507 07/31/17 0421  NA 137 142 140 140  K 3.5 3.8 3.4* 3.8  CL  --   --  107 106  CO2 29 23 27 28   GLUCOSE 89 74 81 124*  BUN 9.3 4.2* <5* 8  CREATININE 0.8 0.6 0.57 0.70  CALCIUM 9.2 8.7 8.5* 8.6*  MG  --   --  1.7 2.3  PHOS  --   --  4.4  --    GFR Estimated Creatinine Clearance: 73.4 mL/min (by C-G formula based on SCr of 0.7 mg/dL). Liver Function Tests: Recent Labs  Lab 07/25/17 1019 07/29/17 1540 07/30/17 0507  AST 22 19 16   ALT 14 12 12*  ALKPHOS 31* 29* 24*  BILITOT 0.33 0.39 0.9  PROT 6.7 6.3* 5.8*  ALBUMIN 4.1 3.8 3.5   Recent Labs  Lab 07/29/17 1540  LIPASE 27  AMYLASE 39   No results for input(s): AMMONIA in the last 168 hours. Coagulation profile No results for input(s): INR, PROTIME in the last 168 hours.  CBC: Recent Labs  Lab 07/25/17 1019 07/29/17 1540 07/30/17 0507 07/31/17 0421  WBC 3.8* 3.5* 3.9* 3.6*  NEUTROABS 2.0 1.5  --   --   HGB 11.2* 11.0* 10.1* 9.8*  HCT 34.0* 33.7* 31.3* 29.7*  MCV 87.0 87.3 87.4 87.4  PLT 173 163 144* 142*   Cardiac Enzymes: No results for input(s): CKTOTAL, CKMB, CKMBINDEX, TROPONINI in the last 168 hours. BNP: Invalid input(s): POCBNP CBG: Recent Labs  Lab 07/30/17 0738  GLUCAP 73   D-Dimer No results for input(s): DDIMER in the last 72 hours. Hgb A1c No results for input(s): HGBA1C in the last 72 hours. Lipid Profile No results for input(s): CHOL, HDL, LDLCALC, TRIG, CHOLHDL, LDLDIRECT in the last 72 hours. Thyroid function studies Recent Labs    07/30/17 0507  TSH 1.405   Anemia work up No results for input(s): VITAMINB12, FOLATE, FERRITIN, TIBC, IRON, RETICCTPCT in the last 72 hours. Microbiology Recent Results  (from the past 240 hour(s))  Culture, Urine     Status: Abnormal   Collection Time: 07/29/17 10:53 PM  Result Value Ref Range Status   Specimen Description URINE, CLEAN CATCH  Final   Special Requests NONE  Final   Culture (A)  Final    <10,000 COLONIES/mL INSIGNIFICANT GROWTH Performed at [facility], [address] [place],  [address]    Report Status 07/31/2017 FINAL  Final      Studies:  Ct Abdomen Pelvis W Contrast  Result Date: 07/30/2017 CLINICAL DATA:  Left upper quadrant and left-sided abdominal pain. History of metastatic cholangiocarcinoma. EXAM: CT ABDOMEN AND PELVIS WITH CONTRAST TECHNIQUE: Multidetector CT imaging of the abdomen and pelvis was performed using the standard protocol following bolus administration of intravenous contrast. CONTRAST:  40mL ISOVUE-300 IOPAMIDOL (ISOVUE-300) INJECTION 61% COMPARISON:  07/20/2017 FINDINGS: Lower chest: Unremarkable. Hepatobiliary: Caudate lesion again identified in similar in appearance measuring 2.3 x 1.9 cm today compared to 2.4 x 2.0 cm previously. Anterior lesion at the falciform ligament is also stable measuring 2.4 x 0.8 cm today compared 2.5 x 0.9 cm previously. The segment VI lesion described on the previous study is not readily evident today. There is no evidence for gallstones, gallbladder wall thickening, or pericholecystic fluid. No intrahepatic or extrahepatic biliary dilation. Pancreas: No focal mass lesion. No dilatation of the main duct. No intraparenchymal cyst. No peripancreatic edema. Spleen: No splenomegaly. No focal mass lesion. Adrenals/Urinary Tract: No adrenal nodule or mass. Kidneys unremarkable. No evidence for hydroureter. The urinary bladder appears normal for the degree of distention. Stomach/Bowel: Stomach is distended. Duodenum is normally positioned as is the ligament of Treitz. No small bowel wall thickening. No small bowel dilatation. Colon is nondilated sigmoid colon is decompressed and may  have mild diffuse wall thickening but this is similar to prior. Vascular/Lymphatic: There is abdominal aortic atherosclerosis without aneurysm. Confluent nodal conglomeration in the hepatoduodenal ligament/porta hepatis is similar to prior measuring 3.9 x 3.0 cm today compared to 3.8 x 3.1 cm previously. This generates mass-effect on the main portal vein which remains patent. There is substantial attenuation of the left renal vein at the confluence to the IVC. Abnormal soft tissue nearly encases the right renal artery. No pelvic sidewall lymphadenopathy. Reproductive: The large cystic and solid lesion filling the cul-de-sac and left adnexal  space is similar to prior. This lesion measures up to 13.1 cm craniocaudal length. Multiple calcified uterine fibroids are evident. Other: The abnormal soft tissues seen in the anterior left lower quadrant (image 59 series 2) is similar to prior there is trace intraperitoneal free fluid. Musculoskeletal: Bone windows reveal no worrisome lytic or sclerotic osseous lesions. IMPRESSION: 1. No substantial interval change in exam. 2. Complex cystic and solid mass filling the cul-de-sac and left adnexal space is similar to prior. There is abnormal soft tissue in the anterior left lower quadrant concerning for metastatic disease. 3. Stable appearance of the caudate lesion in this patient with known cholangiocarcinoma. There is confluent necrotic lymphadenopathy in the porta hepatis, similar to prior with substantial mass-effect on the left renal vein as it enters the IVC. 4. Fibroid change in the uterus. 5. Trace intraperitoneal free fluid. Electronically Signed   By: Misty Stanley M.D.   On: 07/30/2017 13:44    Assessment: 51 y.o.African-American female, without significant past medical history, presented with abdominal pain  1. Metastatic intrahepatic cholangiocarcinoma, with probable node and lung metastasis, [id number], stage IV 2.  Intractable abdominal pain, secondary to  cancer progression 3. Nausea and anorexia and weight loss  4. Neutropenia and anemia 5. Constipation 6. Oral thrush  7. Peripheral neuropathy, grade 1 8.  Severe protein and calorie malnutrition   Plan:  -I reviewed her CT scan findings, which is essentially the same as 10 days ago.   -Her pain is overall much better controlled , I will transition her pain medication to oral. I will let her try oral Dilaudid 2 mg every 3 hours as needed for severe pain and increase her OxyContin, to 40 mg every 12 hours. -She would need to have a prescription of Megace, and possible Dilaudid if she responds well upon discharge.  She would likely go home with a regimen of OxyContin 40 mg every 12 hours, Dilaudid as needed for severe pain, and oxycodone as needed for moderate pain -Oral thrush is much improved, okay to discontinue Diflucan upon discharge tomorrow, and continue nystatin mouthwash -Patient is eager to go home today, I recommend to try oral pain meds today and discharge home tomorrow morning if her pain is well controlled -I greatly appreciate palliative consult and hospitalist team's excellent care    Truitt Merle, MD 07/31/2017  1pm

## 2017-07-31 NOTE — Progress Notes (Signed)
Initial Nutrition Assessment  DOCUMENTATION CODES:   Non-severe (moderate) malnutrition in context of chronic illness  INTERVENTION:   Pt to request [employer] Breakfast packets via kitchen Encouraged PO intake and to continue protein supplements at home  RD will continue to monitor plan  NUTRITION DIAGNOSIS:   Moderate Malnutrition related to chronic illness, cancer and cancer related treatments as evidenced by percent weight loss, energy intake < or equal to 75% for > or equal to 1 month, severe muscle depletion.  GOAL:   Patient will meet greater than or equal to 90% of their needs  MONITOR:   PO intake, Supplement acceptance, Labs, Weight trends, I & O's  REASON FOR ASSESSMENT:   Malnutrition Screening Tool    ASSESSMENT:   51 year old female unfortunate with history of metastatic cholangiocarcinoma diagnosed November 2017, history of gastroesophageal reflux disease, hyperlipidemia, migraine headaches presenting from oncologist office secondary to dehydration and worsening abdominal pain.  Patient being hydrated with IV fluids, pain management.  CT abdomen and pelvis ordered and pending.  Oncology following.  Patient reports worsened taste changes in the past 2 weeks which has impacted her PO intake. Pt states "chicken tastes like paper". On 12/1 meal completions ranged between 50-65%. Pt is trying to eat and drink plenty of liquids. States her appetite is "sporadic" and she has experienced sensitivity to sweetness. Pt has been using unflavored [employer] protein powder at home and drinks pre-made [employer] drinks.  Pt encouraged to request [employer] from kitchen.   Per chart review, pt has lost 12 lb since 10/22 (9% wt loss x 1.5 months, significant for time frame).   Labs reviewed. Medications: Vitamin D tablet daily, Marinol capsule BID, Megace daily, Protonix tablet BID, senokot tablet BID  NUTRITION - FOCUSED PHYSICAL EXAM:   Most Recent Value  Orbital Region  No depletion  Upper Arm Region  No depletion  Thoracic and Lumbar Region  Unable to assess  Buccal Region  No depletion  Temple Region  No depletion  Clavicle Bone Region  Severe depletion  Clavicle and Acromion Bone Region  Severe depletion  Scapular Bone Region  Unable to assess  Dorsal Hand  No depletion  Patellar Region  Unable to assess  Anterior Thigh Region  Unable to assess  Posterior Calf Region  Unable to assess  Edema (RD Assessment)  None       Diet Order:  Diet regular Room service appropriate? Yes; Fluid consistency: Thin  EDUCATION NEEDS:   Education needs have been addressed  Skin:  Skin Assessment: Reviewed RN Assessment  Last BM:  11/30  Height:   Ht Readings from Last 1 Encounters:  07/29/17 5\' 6"  (1.676 m)    Weight:   Wt Readings from Last 1 Encounters:  07/29/17 122 lb (55.3 kg)    Ideal Body Weight:  59.1 kg  BMI:  Body mass index is 19.69 kg/m.  Estimated Nutritional Needs:   Kcal:  1700-1900  Protein:  80-90g  Fluid:  1.9L/day   Clayton Bibles, MS, RD, LDN [place] Dietitian Pager: [phone] After Hours Pager: [phone]

## 2017-07-31 NOTE — Evaluation (Signed)
Occupational Therapy Evaluation/Discharge Patient Details Name: Nancy [name] MRN: [id number] DOB: 18-Jun-1966 Today's Date: 07/31/2017    History of Present Illness Nancy [name] is a 51 y.o. female with medical history significant of gastroesophageal disease, metastatic cholangiocarcinoma diagnosed in November 2017, hyperlipidemia, seasonal allergies, migraines presented in follow-up by her oncologist office secondary to dehydration and worsening abdominal pain.     Clinical Impression   Patient evaluated by Occupational Therapy with no further acute OT needs identified. All education has been completed and the patient has no further questions. Pt is independent with all ADL and IADL. Educated pt on energy conservation strategies and non-pharmocological pain management strategies including guided meditation, mindfulness, and aromatherapy and provided handouts per pt request. Pt with no further acute OT needs. OT signing off.    Follow Up Recommendations  No OT follow up    Equipment Recommendations  None recommended by OT    Recommendations for Other Services       Precautions / Restrictions Precautions Precautions: None Restrictions Weight Bearing Restrictions: No      Mobility Bed Mobility Overal bed mobility: Independent                Transfers Overall transfer level: Independent                    Balance Overall balance assessment: No apparent balance deficits (not formally assessed)                                         ADL either performed or assessed with clinical judgement   ADL Overall ADL's : Modified independent                                       General ADL Comments: Discussed energy conservation strategies for daily tasks including use of equipment, breathing strategies, activity pacing, and body mechanics. Also discussed alternative pain management strategies including guided meditation,  aromatherapy, and mindfulness.     Vision Baseline Vision/History: Wears glasses Wears Glasses: At all times Patient Visual Report: No change from baseline       Perception     Praxis      Pertinent Vitals/Pain Pain Assessment: Faces Faces Pain Scale: Hurts little more Pain Location: L upper quadrant pain Pain Descriptors / Indicators: Aching Pain Intervention(s): Limited activity within patient's tolerance;Monitored during session     Hand Dominance Right   Extremity/Trunk Assessment Upper Extremity Assessment Upper Extremity Assessment: Overall WFL for tasks assessed   Lower Extremity Assessment Lower Extremity Assessment: Defer to PT evaluation   Cervical / Trunk Assessment Cervical / Trunk Assessment: Normal   Communication Communication Communication: No difficulties   Cognition Arousal/Alertness: Awake/alert Behavior During Therapy: WFL for tasks assessed/performed Overall Cognitive Status: Within Functional Limits for tasks assessed                                     General Comments       Exercises     Shoulder Instructions      Home Living Family/patient expects to be discharged to:: Private residence Living Arrangements: Spouse/significant other Available Help at Discharge: Family;Available PRN/intermittently Type of Home: House Home Access: Level entry     Home Layout:  Two level Alternate Level Stairs-Number of Steps: 16 Alternate Level Stairs-Rails: Can reach both;Left;Right Bathroom Shower/Tub: Tub/shower unit;Walk-in shower   Bathroom Toilet: Standard     Home Equipment: Shower seat   Additional Comments: pt about to move to house that is 2 story but has Restaurant manager, fast food on main.       Prior Functioning/Environment Level of Independence: Independent        Comments: prior to abdominal pain was working from home for [employer] as well as lifting some wts and walking 2-4 mi. In last month she has been unable to do  this due to abdominal pain and the need for narcotic pain meds.         OT Problem List:        OT Treatment/Interventions:      OT Goals(Current goals can be found in the care plan section) Acute Rehab OT Goals Patient Stated Goal: return home, no pain OT Goal Formulation: With patient Time For Goal Achievement: 08/14/17 Potential to Achieve Goals: Good  OT Frequency:     Barriers to D/C:            Co-evaluation              AM-PAC PT "6 Clicks" Daily Activity     Outcome Measure Help from another person eating meals?: None Help from another person taking care of personal grooming?: None Help from another person toileting, which includes using toliet, bedpan, or urinal?: None Help from another person bathing (including washing, rinsing, drying)?: None Help from another person to put on and taking off regular upper body clothing?: None Help from another person to put on and taking off regular lower body clothing?: None 6 Click Score: 24   End of Session    Activity Tolerance: Patient tolerated treatment well Patient left: in bed                   Time: 1000-1010 OT Time Calculation (min): 10 min Charges:  OT General Charges $OT Visit: 1 Visit OT Evaluation $OT Eval Low Complexity: 1 Low G-Codes:     Redmond Baseman, MS, OTR/L 07/31/2017, 11:31 AM

## 2017-07-31 NOTE — Progress Notes (Signed)
PROGRESS NOTE    Nancy [name]  GEX:[id number] DOB: [birth year] DOA: 07/29/2017 PCP: [name], DO    Brief Narrative:  Patient is a pleasant 51 year old female unfortunate with history of metastatic cholangiocarcinoma diagnosed November 2017, history of gastroesophageal reflux disease, hyperlipidemia, migraine headaches presenting from oncologist office secondary to dehydration and worsening abdominal pain.  Patient being hydrated with IV fluids, pain management.  CT abdomen and pelvis ordered and pending.  Oncology following.   Assessment & Plan:   Principal Problem:   Abdominal pain, acute, left upper quadrant Active Problems:   Acute neoplasm-related pain   TIA (transient ischemic attack)   Hyperlipidemia LDL goal <70   Metastatic cholangiocarcinoma to lung ([place])   Anemia due to antineoplastic chemotherapy   Oral candidiasis   Dehydration   Hypokalemia  #1 acute left-sided abdominal pain Likely secondary to metastatic disease.  Patient with history of metastatic cholangiocarcinoma with chronic right-sided upper abdominal pain and chronic pain regimen presented with acute left-sided abdominal pain.  Patient with significant cramping pain on admission and patient noted to be having bowel movements.  Nominal pain currently improving on current regimen.  In discussions with oncology will increase the patient's long-acting OxyContin to 40 mg twice daily.  Patient currently on oxycodone 5-10 mg every 4 hours as needed for breakthrough pain.  Patient also noted on morphine 4 mg IV every 2 hours as needed for breakthrough pain.  Oncology recommending addition of oral Dilaudid 2 mg p.o. daily for breakthrough pain.  CT abdomen and pelvis done 07/30/2017 with complex cystic and solid mass filling the cul-de-sac and left adnexal space unchanged from prior CT.  Abnormal soft tissue in anterior left lower quadrant concerning for metastatic disease.  Stable appearance of coated lesion  in patient with known cholangiocarcinoma.  Confluent necrotic lymphadenopathy in porta hepatis, similar to prior with substantial mass-effect on left renal vein as it enters IVC.  Fibroid change in the uterus.  Oncology following who feels CT scan findings similar as prior CT scan.  Outpatient follow-up.  Palliative care also has assessed the patient for pain management.   2.  Dehydration IV fluids.  If patient has adequate oral intake over the next 24 hours saline lock IV fluids.  3.  Oral candidiasis Continue nystatin swish and swallow as well as oral Diflucan.  4.  History of TIA Stable.  Continue aspirin for secondary stroke prophylaxis.  5.  Anemia Hemoglobin currently at stable at 9.8.  No overt bleeding.  Follow.  6.  Hypokalemia Replete.  Keep magnesium greater than 2.  7.  Gastroesophageal reflux disease.  Increase PPI to twice daily.  GI cocktail as needed.  8.  Metastatic cholangiocarcinoma Per oncology.   DVT prophylaxis: Lovenox Code Status: Full Family Communication: Dated patient and husband at bedside. Disposition Plan: Likely home once acute medical issues have resolved and pending PT evaluation, hopefully in the next 24-48 hours.   Consultants:   Oncology: [facility] 07/29/2017  Palliative care: Dr. Rowe Pavy 07/31/2017  Procedures:   CT abdomen and pelvis 07/30/2017    Antimicrobials:   None   Subjective: Patient laying in bed.  Patient states abdominal pain being managed on current pain regimen.  With no nausea or emesis.  Patient states poor oral intake yesterday.  Patient tolerated some of her breakfast today.  Hematology/oncology at bedside.  Patient asking to go home.  Objective: Vitals:   07/30/17 0537 07/30/17 1333 07/30/17 2020 07/31/17 0538  BP: 116/76 138/86 117/70 106/68  Pulse: 76 86 83 68  Resp: 14 16 16 20   Temp: 98.1 F (36.7 C) 99.2 F (37.3 C) 98.6 F (37 C) 98.9 F (37.2 C)  TempSrc: Oral Oral Oral Oral  SpO2: 100% 100% 99%  97%  Weight:      Height:        Intake/Output Summary (Last 24 hours) at 07/31/2017 1051 Last data filed at 07/30/2017 1700 Gross per 24 hour  Intake 480 ml  Output -  Net 480 ml   Filed Weights   07/29/17 1640  Weight: 55.3 kg (122 lb)    Examination:  General exam: Appears calm and comfortable  Respiratory system: Lungs clear to auscultation bilaterally.  No wheezes, no crackles, no rhonchi.  Respiratory effort normal. Cardiovascular system: Regular rate and rhythm no murmurs rubs or gallops.  No lower extremity edema.   Gastrointestinal system: Abdomen is soft, nontender, nondistended, positive bowel sounds.  No hepatosplenomegaly.   Central nervous system: Alert and oriented. No focal neurological deficits. Extremities: Symmetric 5 x 5 power. Skin: No rashes, lesions or ulcers Psychiatry: Judgement and insight appear normal. Mood & affect appropriate.     Data Reviewed: I have personally reviewed following labs and imaging studies  CBC: Recent Labs  Lab 07/25/17 1019 07/29/17 1540 07/30/17 0507 07/31/17 0421  WBC 3.8* 3.5* 3.9* 3.6*  NEUTROABS 2.0 1.5  --   --   HGB 11.2* 11.0* 10.1* 9.8*  HCT 34.0* 33.7* 31.3* 29.7*  MCV 87.0 87.3 87.4 87.4  PLT 173 163 144* 846*   Basic Metabolic Panel: Recent Labs  Lab 07/25/17 1019 07/29/17 1540 07/30/17 0507 07/31/17 0421  NA 137 142 140 140  K 3.5 3.8 3.4* 3.8  CL  --   --  107 106  CO2 29 23 27 28   GLUCOSE 89 74 81 124*  BUN 9.3 4.2* <5* 8  CREATININE 0.8 0.6 0.57 0.70  CALCIUM 9.2 8.7 8.5* 8.6*  MG  --   --  1.7 2.3  PHOS  --   --  4.4  --    GFR: Estimated Creatinine Clearance: 73.4 mL/min (by C-G formula based on SCr of 0.7 mg/dL). Liver Function Tests: Recent Labs  Lab 07/25/17 1019 07/29/17 1540 07/30/17 0507  AST 22 19 16   ALT 14 12 12*  ALKPHOS 31* 29* 24*  BILITOT 0.33 0.39 0.9  PROT 6.7 6.3* 5.8*  ALBUMIN 4.1 3.8 3.5   Recent Labs  Lab 07/29/17 1540  LIPASE 27  AMYLASE 39   No  results for input(s): AMMONIA in the last 168 hours. Coagulation Profile: No results for input(s): INR, PROTIME in the last 168 hours. Cardiac Enzymes: No results for input(s): CKTOTAL, CKMB, CKMBINDEX, TROPONINI in the last 168 hours. BNP (last 3 results) No results for input(s): PROBNP in the last 8760 hours. HbA1C: No results for input(s): HGBA1C in the last 72 hours. CBG: Recent Labs  Lab 07/30/17 0738  GLUCAP 73   Lipid Profile: No results for input(s): CHOL, HDL, LDLCALC, TRIG, CHOLHDL, LDLDIRECT in the last 72 hours. Thyroid Function Tests: Recent Labs    07/30/17 0507  TSH 1.405   Anemia Panel: No results for input(s): VITAMINB12, FOLATE, FERRITIN, TIBC, IRON, RETICCTPCT in the last 72 hours. Sepsis Labs: No results for input(s): PROCALCITON, LATICACIDVEN in the last 168 hours.  Recent Results (from the past 240 hour(s))  Culture, Urine     Status: Abnormal   Collection Time: 07/29/17 10:53 PM  Result Value Ref Range Status  Specimen Description URINE, CLEAN CATCH  Final   Special Requests NONE  Final   Culture (A)  Final    <10,000 COLONIES/mL INSIGNIFICANT GROWTH Performed at [facility], [place] [address] [place], [place] [address]    Report Status 07/31/2017 FINAL  Final         Radiology Studies: Ct Abdomen Pelvis W Contrast  Result Date: 07/30/2017 CLINICAL DATA:  Left upper quadrant and left-sided abdominal pain. History of metastatic cholangiocarcinoma. EXAM: CT ABDOMEN AND PELVIS WITH CONTRAST TECHNIQUE: Multidetector CT imaging of the abdomen and pelvis was performed using the standard protocol following bolus administration of intravenous contrast. CONTRAST:  58mL ISOVUE-300 IOPAMIDOL (ISOVUE-300) INJECTION 61% COMPARISON:  07/20/2017 FINDINGS: Lower chest: Unremarkable. Hepatobiliary: Caudate lesion again identified in similar in appearance measuring 2.3 x 1.9 cm today compared to 2.4 x 2.0 cm previously. Anterior lesion at the falciform  ligament is also stable measuring 2.4 x 0.8 cm today compared 2.5 x 0.9 cm previously. The segment VI lesion described on the previous study is not readily evident today. There is no evidence for gallstones, gallbladder wall thickening, or pericholecystic fluid. No intrahepatic or extrahepatic biliary dilation. Pancreas: No focal mass lesion. No dilatation of the main duct. No intraparenchymal cyst. No peripancreatic edema. Spleen: No splenomegaly. No focal mass lesion. Adrenals/Urinary Tract: No adrenal nodule or mass. Kidneys unremarkable. No evidence for hydroureter. The urinary bladder appears normal for the degree of distention. Stomach/Bowel: Stomach is distended. Duodenum is normally positioned as is the ligament of Treitz. No small bowel wall thickening. No small bowel dilatation. Colon is nondilated sigmoid colon is decompressed and may have mild diffuse wall thickening but this is similar to prior. Vascular/Lymphatic: There is abdominal aortic atherosclerosis without aneurysm. Confluent nodal conglomeration in the hepatoduodenal ligament/porta hepatis is similar to prior measuring 3.9 x 3.0 cm today compared to 3.8 x 3.1 cm previously. This generates mass-effect on the main portal vein which remains patent. There is substantial attenuation of the left renal vein at the confluence to the IVC. Abnormal soft tissue nearly encases the right renal artery. No pelvic sidewall lymphadenopathy. Reproductive: The large cystic and solid lesion filling the cul-de-sac and left adnexal space is similar to prior. This lesion measures up to 13.1 cm craniocaudal length. Multiple calcified uterine fibroids are evident. Other: The abnormal soft tissues seen in the anterior left lower quadrant (image 59 series 2) is similar to prior there is trace intraperitoneal free fluid. Musculoskeletal: Bone windows reveal no worrisome lytic or sclerotic osseous lesions. IMPRESSION: 1. No substantial interval change in exam. 2. Complex  cystic and solid mass filling the cul-de-sac and left adnexal space is similar to prior. There is abnormal soft tissue in the anterior left lower quadrant concerning for metastatic disease. 3. Stable appearance of the caudate lesion in this patient with known cholangiocarcinoma. There is confluent necrotic lymphadenopathy in the porta hepatis, similar to prior with substantial mass-effect on the left renal vein as it enters the IVC. 4. Fibroid change in the uterus. 5. Trace intraperitoneal free fluid. Electronically Signed   By: Misty Stanley M.D.   On: 07/30/2017 13:44        Scheduled Meds: . aspirin  81 mg Oral Daily  . cholecalciferol  5,000 Units Oral Daily  . dronabinol  5 mg Oral BID AC  . enoxaparin (LOVENOX) injection  40 mg Subcutaneous Q24H  . fluconazole  100 mg Oral Daily  . loratadine  10 mg Oral Daily  . megestrol  400 mg Oral Daily  . nystatin  5 mL Oral QID  . OLANZapine  2.5 mg Oral QHS  . oxyCODONE  10 mg Oral Once  . oxyCODONE  40 mg Oral Q12H  . pantoprazole  40 mg Oral BID AC  . scopolamine  1 patch Transdermal Q72H  . senna  2 tablet Oral BID   Continuous Infusions: . sodium chloride       LOS: 2 days    Time spent: 35 mins    Irine Seal, MD [facility] Pager [phone]  If 7PM-7AM, please contact night-coverage [email or web address] Password [facility] 07/31/2017, 10:51 AM

## 2017-08-01 ENCOUNTER — Other Ambulatory Visit: Payer: Self-pay | Admitting: Radiology

## 2017-08-01 ENCOUNTER — Telehealth: Payer: Self-pay | Admitting: Hematology

## 2017-08-01 DIAGNOSIS — C7801 Secondary malignant neoplasm of right lung: Secondary | ICD-10-CM

## 2017-08-01 LAB — BASIC METABOLIC PANEL
Anion gap: 5 (ref 5–15)
BUN: <5 mg/dL — ABNORMAL LOW (ref 6–20)
CO2: 27 mmol/L (ref 22–32)
Calcium: 8.8 mg/dL — ABNORMAL LOW (ref 8.9–10.3)
Chloride: 112 mmol/L — ABNORMAL HIGH (ref 101–111)
Creatinine, Ser: 0.57 mg/dL (ref 0.44–1.00)
GFR calc Af Amer: >60 mL/min (ref >60)
GFR calc non Af Amer: >60 mL/min (ref >60)
Glucose, Bld: 121 mg/dL — ABNORMAL HIGH (ref 65–99)
Potassium: 3.9 mmol/L (ref 3.5–5.1)
Sodium: 144 mmol/L (ref 135–145)

## 2017-08-01 LAB — CBC
HCT: 31.2 % — ABNORMAL LOW (ref 36.0–46.0)
Hemoglobin: 10.2 g/dL — ABNORMAL LOW (ref 12.0–15.0)
MCH: 28.6 pg (ref 26.0–34.0)
MCHC: 32.7 g/dL (ref 30.0–36.0)
MCV: 87.4 fL (ref 78.0–100.0)
Platelets: 157 10*3/uL (ref 150–400)
RBC: 3.57 MIL/uL — ABNORMAL LOW (ref 3.87–5.11)
RDW: 13.9 % (ref 11.5–15.5)
WBC: 3.5 10*3/uL — ABNORMAL LOW (ref 4.0–10.5)

## 2017-08-01 MED ORDER — NYSTATIN 100000 UNIT/ML MT SUSP: 5.0000 mL | Freq: Four times a day (QID) | OROMUCOSAL | 0 refills | Status: AC

## 2017-08-01 MED ORDER — OMEPRAZOLE 40 MG PO CPDR: 40.0000 mg | DELAYED_RELEASE_CAPSULE | Freq: Two times a day (BID) | ORAL | 1 refills | Status: DC

## 2017-08-01 MED ORDER — MAGNESIUM HYDROXIDE 400 MG/5ML PO SUSP
30.0000 mL | Freq: Every day | ORAL | Status: DC
  Administered 2017-08-01: 30 mL via ORAL
  Filled 2017-08-01: qty 30

## 2017-08-01 MED ORDER — HEPARIN SOD (PORK) LOCK FLUSH 100 UNIT/ML IV SOLN
500.0000 [IU] | INTRAVENOUS | Status: DC | PRN
  Filled 2017-08-01: qty 5

## 2017-08-01 MED ORDER — ALPRAZOLAM 0.5 MG PO TABS: 0.5000 mg | ORAL_TABLET | Freq: Three times a day (TID) | ORAL | 0 refills | Status: DC | PRN

## 2017-08-01 MED ORDER — POLYETHYLENE GLYCOL 3350 17 G PO PACK
17.0000 g | PACK | Freq: Two times a day (BID) | ORAL | Status: DC
  Administered 2017-08-01: 17 g via ORAL

## 2017-08-01 MED ORDER — POLYETHYLENE GLYCOL 3350 17 G PO PACK: 17.0000 g | PACK | Freq: Two times a day (BID) | ORAL | 0 refills | Status: AC

## 2017-08-01 MED ORDER — OXYCODONE HCL 5 MG PO TABS
5.0000 mg | ORAL_TABLET | ORAL | Status: DC | PRN
  Administered 2017-08-01: 10 mg via ORAL
  Filled 2017-08-01: qty 2

## 2017-08-01 MED ORDER — HYDROMORPHONE HCL 2 MG PO TABS: 2.0000 mg | ORAL_TABLET | ORAL | 0 refills | Status: DC | PRN

## 2017-08-01 MED ORDER — MEGESTROL ACETATE 400 MG/10ML PO SUSP: 400.0000 mg | Freq: Every day | ORAL | 0 refills | Status: DC

## 2017-08-01 MED ORDER — OXYCODONE HCL ER 40 MG PO T12A: 40.0000 mg | EXTENDED_RELEASE_TABLET | Freq: Two times a day (BID) | ORAL | 0 refills | Status: DC

## 2017-08-01 MED ORDER — SENNA 8.6 MG PO TABS: 2.0000 | ORAL_TABLET | Freq: Two times a day (BID) | ORAL | 0 refills | Status: AC

## 2017-08-01 NOTE — Progress Notes (Signed)
Discharge instructions completed with patient, pt verbalized understanding. Port deacssessed. Scripts given.

## 2017-08-01 NOTE — Discharge Summary (Signed)
Physician Discharge Summary  Nancy [name] GGE:[id number] DOB: 1965/12/11 DOA: 07/29/2017  PCP: [name], DO  Admit date: 07/29/2017 Discharge date: 08/01/2017  Time spent: 55 minutes  Recommendations for Outpatient Follow-up:  1. Follow-up with Dr. Burr Medico, hematology/oncology as previously scheduled.   Discharge Diagnoses:  Principal Problem:   Abdominal pain, acute, left upper quadrant Active Problems:   Acute neoplasm-related pain   TIA (transient ischemic attack)   Hyperlipidemia LDL goal <70   Metastatic cholangiocarcinoma to lung ([place])   Anemia due to antineoplastic chemotherapy   Oral candidiasis   Dehydration   Hypokalemia   Discharge Condition: Stable and improved  Diet recommendation: Regular  Filed Weights   07/29/17 1640  Weight: 55.3 kg (122 lb)    History of present illness:   Nancy [name] is a 51 y.o. female with medical history significant of gastroesophageal disease, metastatic cholangiocarcinoma diagnosed in November 2017, hyperlipidemia, seasonal allergies, migraines presented in follow-up by her oncologist office secondary to dehydration and worsening abdominal pain.  Patient stated that abdominal pain started at 2 AM on the day of admission and on the left side of the abdomen which is different from the site of the abnormal staining which is chronic dementia secondary to her cancer.  Patient described the pain as cramping in nature, constant.  Patient denied any fevers, no chills, no nausea, no vomiting, no chest pain, no shortness of breath, no diarrhea, no constipation, no dysuria, no hematemesis, no melena, no hematochezia.  Patient does endorse some generalized weakness.  Patient also noted some occasional jerking every other day.  Patient while at her hematologist/oncologist office patient was actually given several rounds of IV morphine to manage abdominal pain however no significant improvement and patient was subsequently recommended  for admission.  Time of my assessment patient laying in bed with knees bent up rocking back and forth with significant abdominal discomfort/pain    Hospital Course:  #1 acute left-sided abdominal pain Likely secondary to metastatic disease.  Patient with history of metastatic cholangiocarcinoma with chronic right-sided upper abdominal pain and chronic pain regimen presented with acute left-sided abdominal pain.  Patient with significant cramping pain on admission and patient noted to be having bowel movements.  She has long-acting OxyContin was initially increased to 30 mg twice daily.  IV morphine was also placed for severe breakthrough pain.  Patient's home regimen of oxycodone 5-10 mg every 4 hours as needed was also made available for moderate breakthrough pain.  Patient improved clinically and also seen in consultation by palliative care who had recommended to continue current regimen.   In discussions with oncology it was recommended to increase the patient's long-acting OxyContin to 40 mg twice daily.  Patient currently on oxycodone 5-10 mg every 4 hours as needed for breakthrough pain.  Patient also noted on morphine 4 mg IV every 2 hours as needed for breakthrough pain.  Oncology recommended addition of oral Dilaudid 2 mg p.o. every 3 hours as needed for severe breakthrough pain.  CT abdomen and pelvis done 07/30/2017 with complex cystic and solid mass filling the cul-de-sac and left adnexal space unchanged from prior CT.  Abnormal soft tissue in anterior left lower quadrant concerning for metastatic disease.  Stable appearance of coated lesion in patient with known cholangiocarcinoma.  Confluent necrotic lymphadenopathy in porta hepatis, similar to prior with substantial mass-effect on left renal vein as it enters IVC.  Fibroid change in the uterus.  Oncology following who feels CT scan findings similar as prior  CT scan.  Patient's pain improved and was better controlled on OxyContin 40 mg twice  daily, oxycodone 5-10 mg every 4 hours for moderate breakthrough as well as Dilaudid 2 mg every 3 hours as needed for severe breakthrough pain.  Patient's pain seemed to be managed on this current regimen and patient be discharged on this regimen.  Patient was also placed on MiraLAX twice daily as well as Senokot S twice daily.  Patient was seen by palliative care.  Outpatient follow-up with oncology as previously scheduled.   2.  Dehydration Patient noted to be dehydrated on admission and as such was subsequently placed on IV fluids.  Patient had a poor appetite Megace was added to her regimen and as her appetite improved IV fluids were subsequently discontinued.  Outpatient follow-up.   3.  Oral candidiasis Patient was placed on nystatin swish and swallow as well as oral Diflucan.  Diflucan will be discontinued on discharge and patient was discharged home on 4 more days of oral nystatin swish and swallow.  4.  History of TIA Stable.  Continued on home regimen of aspirin for secondary stroke prophylaxis.  5.  Anemia Hemoglobin remained stable at 9-10 during the hospitalization.  Patient with no overt bleeding.  Outpatient follow-up.    6.  Hypokalemia Repleted.  7.  Gastroesophageal reflux disease.  Patient was maintained on a PPI twice daily as well as a GI cocktail as needed.    8.  Metastatic cholangiocarcinoma Per oncology.  Outpatient follow-up.    Procedures:  CT abdomen and pelvis 07/30/2017      Consultations:  Oncology: [facility] 07/29/2017  Palliative care: Dr. Rowe Pavy 07/31/2017      Discharge Exam: Vitals:   08/01/17 0415 08/01/17 1408  BP: 102/63 118/71  Pulse: 92 82  Resp: 16 16  Temp: 98.6 F (37 C) 99 F (37.2 C)  SpO2: 98% 98%    General: NAD Cardiovascular: RRR Respiratory: CTAB  Discharge Instructions   Discharge Instructions    Diet general   Complete by:  As directed    Increase activity slowly   Complete by:  As directed       Allergies as of 08/01/2017      Reactions   Penicillins Anaphylaxis   Has patient had a PCN reaction causing immediate rash, facial/tongue/throat swelling, SOB or lightheadedness with hypotensionYes Swelling in throat  Has patient had a PCN reaction causing severe rash involving mucus membranes or skin necrosis:/No Has patient had a PCN reaction that required hospitalization/No Has patient had a PCN reaction occurring within the last 10 years:NO If all of the above answers are "NO", then may proceed with Cephalosporin use.   Lipitor [atorvastatin] Palpitations   Tingling, flushing      Medication List    STOP taking these medications   fluconazole 100 MG tablet Commonly known as:  DIFLUCAN   HYDROcodone-acetaminophen 5-325 MG tablet Commonly known as:  NORCO     TAKE these medications   ALPRAZolam 0.5 MG tablet Commonly known as:  XANAX Take 1 tablet (0.5 mg total) by mouth 3 (three) times daily as needed for anxiety. What changed:  when to take this   ASPERCREME EX Apply 1 application topically.   aspirin 81 MG tablet Take 81 mg by mouth daily.   cetirizine 10 MG tablet Commonly known as:  ZYRTEC Take 10 mg by mouth daily as needed. For allergies   COLOSTRUM PO Take 1.25 g by mouth daily. 1.25 grams twice daily  dicyclomine 20 MG tablet Commonly known as:  BENTYL 3 (three) times daily as needed.   dronabinol 5 MG capsule Commonly known as:  MARINOL Take 1 capsule (5 mg total) by mouth 2 (two) times daily before a meal.   HYDROmorphone 2 MG tablet Commonly known as:  DILAUDID Take 1 tablet (2 mg total) by mouth every 3 (three) hours as needed for severe pain.   lidocaine-prilocaine cream Commonly known as:  EMLA Apply 1 application topically as needed. Apply to portacath 1 1/2 hours - 2 hours prior to procedures as needed.   LORazepam 0.5 MG tablet Commonly known as:  ATIVAN Place under tongue and dissolve every 8 hours as needed for nausea. Do not take  Xanax while taking this medication.   magnesium hydroxide 400 MG/5ML suspension Commonly known as:  MILK OF MAGNESIA Take 30 mLs by mouth daily as needed for mild constipation.   megestrol 400 MG/10ML suspension Commonly known as:  MEGACE Take 10 mLs (400 mg total) by mouth daily. Start taking on:  08/02/2017   mometasone 0.1 % lotion Commonly known as:  ELOCON Apply topically daily.   nystatin 100000 UNIT/ML suspension Commonly known as:  MYCOSTATIN Take 5 mLs (500,000 Units total) by mouth 4 (four) times daily for 4 days.   OLANZapine 2.5 MG tablet Commonly known as:  ZYPREXA Take 1 tablet (2.5 mg total) by mouth at bedtime.   omeprazole 40 MG capsule Commonly known as:  PRILOSEC Take 1 capsule (40 mg total) by mouth 2 (two) times daily before a meal. What changed:  Another medication with the same name was removed. Continue taking this medication, and follow the directions you see here.   ondansetron 8 MG tablet Commonly known as:  ZOFRAN Take 1 tablet (8 mg total) by mouth every 8 (eight) hours as needed for nausea or vomiting.   oxyCODONE 5 MG immediate release tablet Commonly known as:  Oxy IR/ROXICODONE Take 1-2 tablets (5-10 mg total) every 4 (four) hours as needed by mouth for severe pain. What changed:  Another medication with the same name was changed. Make sure you understand how and when to take each.   oxyCODONE 40 mg 12 hr tablet Commonly known as:  OXYCONTIN Take 1 tablet (40 mg total) by mouth every 12 (twelve) hours. What changed:    medication strength  how much to take   polyethylene glycol packet Commonly known as:  MIRALAX / GLYCOLAX Take 17 g by mouth 2 (two) times daily. Hold if you develop diarrhea   promethazine 25 MG tablet Commonly known as:  PHENERGAN Take 1 tablet (25 mg total) by mouth every 6 (six) hours as needed for nausea or vomiting.   scopolamine 1 MG/3DAYS Commonly known as:  TRANSDERM-SCOP Place 1 patch (1.5 mg total) every  3 (three) days onto the skin.   senna 8.6 MG Tabs tablet Commonly known as:  SENOKOT Take 2 tablets (17.2 mg total) by mouth 2 (two) times daily.   triamcinolone cream 0.1 % Commonly known as:  KENALOG Apply topically.   Vitamin D3 5000 units Caps Take 1 capsule by mouth daily.   Vitamin K2 40 MCG Tabs Take by mouth.      Allergies  Allergen Reactions  . Penicillins Anaphylaxis    Has patient had a PCN reaction causing immediate rash, facial/tongue/throat swelling, SOB or lightheadedness with hypotensionYes Swelling in throat  Has patient had a PCN reaction causing severe rash involving mucus membranes or skin necrosis:/No Has patient had  a PCN reaction that required hospitalization/No Has patient had a PCN reaction occurring within the last 10 years:NO If all of the above answers are "NO", then may proceed with Cephalosporin use.   . Lipitor [Atorvastatin] Palpitations    Tingling, flushing   Follow-up Information    Truitt Merle, MD Follow up.   Specialties:  Hematology, Oncology Why:  f/u as scheduled. Contact information: [place] [address] [phone]            The results of significant diagnostics from this hospitalization (including imaging, microbiology, ancillary and laboratory) are listed below for reference.    Significant Diagnostic Studies: Ct Chest W Contrast  Result Date: 07/21/2017 CLINICAL DATA:  51 year old female with a history of cholangiocarcinoma diagnosed in November of 2017. Current complaint of diffuse abdominal pain and constipation for the past 3 days. EXAM: CT CHEST, ABDOMEN AND PELVIS WITHOUT CONTRAST TECHNIQUE: Multidetector CT imaging of the chest, abdomen and pelvis was performed following the standard protocol without IV contrast. COMPARISON:  Numerous prior studies including CT scans of chest (04/13/2014) and the abdomen and pelvis dated 04/13/2017 and 01/25/2017 FINDINGS: CT CHEST FINDINGS  Cardiovascular: Right IJ approach single-lumen power injectable port catheter. Catheter tip in good position at the superior cavoatrial junction. The heart is normal in size. Conventional 3 vessel aortic arch anatomy. The aorta is normal in size. Calcifications are present along the proximal left anterior descending coronary artery. No pericardial effusion. Unremarkable main and central pulmonary arteries. Mediastinum/Nodes: Unremarkable CT appearance of the thyroid gland. No suspicious mediastinal or hilar adenopathy. No soft tissue mediastinal mass. The thoracic esophagus is unremarkable. Lungs/Pleura: Stable 3 mm left upper lobe pulmonary nodule (image 41, series 7). Stable 4 mm right upper lobe pulmonary nodule (image 62 series 7). These nodules were previously larger on prior imaging from November of 2017. Findings indicate persistent and unchanged response to therapy. New 3 mm nodule in the anterior aspect of the right upper lobe several cm away from the treated metastatic nodule described above on image 60 of series 7. Interval development of a 4 mm subpleural pulmonary nodule associated with the superior aspect of the left major fissure (image 44, series 7). In retrospect, this may have been present previously as a tiny 1 mm density. Musculoskeletal: No acute fracture or aggressive appearing lytic or blastic osseous lesion. CT ABDOMEN PELVIS FINDINGS Hepatobiliary: Slight interval enlargement in the size of the caudate lobe mass presently measuring 2.4 x 2.0 cm compared to 2.4 x 1.5 cm previously. The lesion is somewhat irregular in shape which makes precise measurements difficult. The previously noted segment 6 lesion remains similar to slightly smaller at a maximum of 0.7 cm. The conglomerate of necrotic aortocaval/ portacaval lymphadenopathy is enlarging now measuring 3.8 x 3.1 cm compared to 3.5 x 2.6 cm previously. Progressive geographic subcapsular low-attenuation along the anterior aspect of hepatic  segment 4B and 3 adjacent to the fissure for the falciform ligament measures 2.5 x 0.9 cm. This was previously barely detectable at 0.8 cm and is favored to represent metastatic disease. The gallbladder remains normal in appearance. No significant biliary ductal dilatation. Pancreas: The pancreas itself remains normal in appearance. Spleen: Normal in size without focal abnormality. Adrenals/Urinary Tract: Adrenal glands are unremarkable. Kidneys are normal, without renal calculi, focal lesion, or hydronephrosis. Bladder is unremarkable. Stomach/Bowel: No focal bowel wall thickening or evidence of obstruction. Vascular/Lymphatic: Minimal atherosclerotic vascular calcifications along the abdominal aorta. No aneurysm, or dissection. Progressive aortocaval and peripancreatic lymphadenopathy.  Aortocaval lymph node posterior to the pancreatic neck now measures 1.7 cm compared to 1.3 cm previously. Reproductive: Marked interval enlargement of the complex cystic and solid mass arising from the left ovary. The mass has become large enough that it is not difficult to measure precisely. Using the coronal, and sagittal reformatted images the mass measures approximately 13 x 10 x 7.7 cm compared to a maximum of 6.8 x 4.1 cm previously. The mass exerts local mass effect on the uterus, bladder and rectosigmoid colon. Multi fibroid uterus again noted with multiple calcified and dystrophic uterine fibroids. The right ovary is unremarkable. Other: Enlarging omental masses in the left lateral inguinal recess. The more superior measures 1.7 x 1.8 cm while the more inferior measures 2.3 x 1.5 cm. No definite ascites. Musculoskeletal: No acute fracture or aggressive appearing lytic or blastic osseous lesion. IMPRESSION: CT CHEST 1. Interval development of 2 small new pulmonary nodules concerning for progressive, or new metastatic disease. Recommend continued attention on follow-up imaging. 2. The previously noted right upper and left  upper lobe pulmonary nodules remain stable in size consistent with treated metastatic disease. 3. Stable position of right IJ approach power injectable port catheter. 4. Coronary artery calcifications. CT ABD/PELVIS 1. Marked interval enlargement of a complex, enhancing, cystic and solid left ovarian mass which now fills the posterior aspect of the pelvis exerting mass effect on the adjacent rectosigmoid colon and bladder. This likely accounts for the patient's lower abdominal pain and sensation of constipation (there is no significant stool burden within the rectum or sigmoid colon). These findings are highly suspicious for a primary ovarian cystic or mucinous adenocarcinoma. 2. Enlarging omental implants in the left lower quadrant highly concerning for metastatic ovarian carcinoma. 3. New/enlarging subcapsular hepatic lesion may represent progressive cholangio or ovarian carcinoma metastases. 4. Enlarging caudate lobe lesion and aortocaval/portacaval confluent necrotic adenopathy concerning for progressive metastatic cholangiocarcinoma. 5. The segment 6 hepatic lesion remains stable or may be smaller on today's examination. These results will be called to the ordering clinician or representative by the Radiologist Assistant, and communication documented in the PACS or zVision Dashboard. Electronically Signed   By: Jacqulynn Cadet M.D.   On: 07/21/2017 09:54   Ct Abdomen Pelvis W Contrast  Result Date: 07/30/2017 CLINICAL DATA:  Left upper quadrant and left-sided abdominal pain. History of metastatic cholangiocarcinoma. EXAM: CT ABDOMEN AND PELVIS WITH CONTRAST TECHNIQUE: Multidetector CT imaging of the abdomen and pelvis was performed using the standard protocol following bolus administration of intravenous contrast. CONTRAST:  80mL ISOVUE-300 IOPAMIDOL (ISOVUE-300) INJECTION 61% COMPARISON:  07/20/2017 FINDINGS: Lower chest: Unremarkable. Hepatobiliary: Caudate lesion again identified in similar in  appearance measuring 2.3 x 1.9 cm today compared to 2.4 x 2.0 cm previously. Anterior lesion at the falciform ligament is also stable measuring 2.4 x 0.8 cm today compared 2.5 x 0.9 cm previously. The segment VI lesion described on the previous study is not readily evident today. There is no evidence for gallstones, gallbladder wall thickening, or pericholecystic fluid. No intrahepatic or extrahepatic biliary dilation. Pancreas: No focal mass lesion. No dilatation of the main duct. No intraparenchymal cyst. No peripancreatic edema. Spleen: No splenomegaly. No focal mass lesion. Adrenals/Urinary Tract: No adrenal nodule or mass. Kidneys unremarkable. No evidence for hydroureter. The urinary bladder appears normal for the degree of distention. Stomach/Bowel: Stomach is distended. Duodenum is normally positioned as is the ligament of Treitz. No small bowel wall thickening. No small bowel dilatation. Colon is nondilated sigmoid colon is decompressed and may  have mild diffuse wall thickening but this is similar to prior. Vascular/Lymphatic: There is abdominal aortic atherosclerosis without aneurysm. Confluent nodal conglomeration in the hepatoduodenal ligament/porta hepatis is similar to prior measuring 3.9 x 3.0 cm today compared to 3.8 x 3.1 cm previously. This generates mass-effect on the main portal vein which remains patent. There is substantial attenuation of the left renal vein at the confluence to the IVC. Abnormal soft tissue nearly encases the right renal artery. No pelvic sidewall lymphadenopathy. Reproductive: The large cystic and solid lesion filling the cul-de-sac and left adnexal space is similar to prior. This lesion measures up to 13.1 cm craniocaudal length. Multiple calcified uterine fibroids are evident. Other: The abnormal soft tissues seen in the anterior left lower quadrant (image 59 series 2) is similar to prior there is trace intraperitoneal free fluid. Musculoskeletal: Bone windows reveal no  worrisome lytic or sclerotic osseous lesions. IMPRESSION: 1. No substantial interval change in exam. 2. Complex cystic and solid mass filling the cul-de-sac and left adnexal space is similar to prior. There is abnormal soft tissue in the anterior left lower quadrant concerning for metastatic disease. 3. Stable appearance of the caudate lesion in this patient with known cholangiocarcinoma. There is confluent necrotic lymphadenopathy in the porta hepatis, similar to prior with substantial mass-effect on the left renal vein as it enters the IVC. 4. Fibroid change in the uterus. 5. Trace intraperitoneal free fluid. Electronically Signed   By: Misty Stanley M.D.   On: 07/30/2017 13:44   Ct Abdomen Pelvis W Contrast  Result Date: 07/21/2017 CLINICAL DATA:  51 year old female with a history of cholangiocarcinoma diagnosed in November of 2017. Current complaint of diffuse abdominal pain and constipation for the past 3 days. EXAM: CT CHEST, ABDOMEN AND PELVIS WITHOUT CONTRAST TECHNIQUE: Multidetector CT imaging of the chest, abdomen and pelvis was performed following the standard protocol without IV contrast. COMPARISON:  Numerous prior studies including CT scans of chest (04/13/2014) and the abdomen and pelvis dated 04/13/2017 and 01/25/2017 FINDINGS: CT CHEST FINDINGS Cardiovascular: Right IJ approach single-lumen power injectable port catheter. Catheter tip in good position at the superior cavoatrial junction. The heart is normal in size. Conventional 3 vessel aortic arch anatomy. The aorta is normal in size. Calcifications are present along the proximal left anterior descending coronary artery. No pericardial effusion. Unremarkable main and central pulmonary arteries. Mediastinum/Nodes: Unremarkable CT appearance of the thyroid gland. No suspicious mediastinal or hilar adenopathy. No soft tissue mediastinal mass. The thoracic esophagus is unremarkable. Lungs/Pleura: Stable 3 mm left upper lobe pulmonary nodule  (image 41, series 7). Stable 4 mm right upper lobe pulmonary nodule (image 62 series 7). These nodules were previously larger on prior imaging from November of 2017. Findings indicate persistent and unchanged response to therapy. New 3 mm nodule in the anterior aspect of the right upper lobe several cm away from the treated metastatic nodule described above on image 60 of series 7. Interval development of a 4 mm subpleural pulmonary nodule associated with the superior aspect of the left major fissure (image 44, series 7). In retrospect, this may have been present previously as a tiny 1 mm density. Musculoskeletal: No acute fracture or aggressive appearing lytic or blastic osseous lesion. CT ABDOMEN PELVIS FINDINGS Hepatobiliary: Slight interval enlargement in the size of the caudate lobe mass presently measuring 2.4 x 2.0 cm compared to 2.4 x 1.5 cm previously. The lesion is somewhat irregular in shape which makes precise measurements difficult. The previously noted segment 6 lesion remains  similar to slightly smaller at a maximum of 0.7 cm. The conglomerate of necrotic aortocaval/ portacaval lymphadenopathy is enlarging now measuring 3.8 x 3.1 cm compared to 3.5 x 2.6 cm previously. Progressive geographic subcapsular low-attenuation along the anterior aspect of hepatic segment 4B and 3 adjacent to the fissure for the falciform ligament measures 2.5 x 0.9 cm. This was previously barely detectable at 0.8 cm and is favored to represent metastatic disease. The gallbladder remains normal in appearance. No significant biliary ductal dilatation. Pancreas: The pancreas itself remains normal in appearance. Spleen: Normal in size without focal abnormality. Adrenals/Urinary Tract: Adrenal glands are unremarkable. Kidneys are normal, without renal calculi, focal lesion, or hydronephrosis. Bladder is unremarkable. Stomach/Bowel: No focal bowel wall thickening or evidence of obstruction. Vascular/Lymphatic: Minimal  atherosclerotic vascular calcifications along the abdominal aorta. No aneurysm, or dissection. Progressive aortocaval and peripancreatic lymphadenopathy. Aortocaval lymph node posterior to the pancreatic neck now measures 1.7 cm compared to 1.3 cm previously. Reproductive: Marked interval enlargement of the complex cystic and solid mass arising from the left ovary. The mass has become large enough that it is not difficult to measure precisely. Using the coronal, and sagittal reformatted images the mass measures approximately 13 x 10 x 7.7 cm compared to a maximum of 6.8 x 4.1 cm previously. The mass exerts local mass effect on the uterus, bladder and rectosigmoid colon. Multi fibroid uterus again noted with multiple calcified and dystrophic uterine fibroids. The right ovary is unremarkable. Other: Enlarging omental masses in the left lateral inguinal recess. The more superior measures 1.7 x 1.8 cm while the more inferior measures 2.3 x 1.5 cm. No definite ascites. Musculoskeletal: No acute fracture or aggressive appearing lytic or blastic osseous lesion. IMPRESSION: CT CHEST 1. Interval development of 2 small new pulmonary nodules concerning for progressive, or new metastatic disease. Recommend continued attention on follow-up imaging. 2. The previously noted right upper and left upper lobe pulmonary nodules remain stable in size consistent with treated metastatic disease. 3. Stable position of right IJ approach power injectable port catheter. 4. Coronary artery calcifications. CT ABD/PELVIS 1. Marked interval enlargement of a complex, enhancing, cystic and solid left ovarian mass which now fills the posterior aspect of the pelvis exerting mass effect on the adjacent rectosigmoid colon and bladder. This likely accounts for the patient's lower abdominal pain and sensation of constipation (there is no significant stool burden within the rectum or sigmoid colon). These findings are highly suspicious for a primary  ovarian cystic or mucinous adenocarcinoma. 2. Enlarging omental implants in the left lower quadrant highly concerning for metastatic ovarian carcinoma. 3. New/enlarging subcapsular hepatic lesion may represent progressive cholangio or ovarian carcinoma metastases. 4. Enlarging caudate lobe lesion and aortocaval/portacaval confluent necrotic adenopathy concerning for progressive metastatic cholangiocarcinoma. 5. The segment 6 hepatic lesion remains stable or may be smaller on today's examination. These results will be called to the ordering clinician or representative by the Radiologist Assistant, and communication documented in the PACS or zVision Dashboard. Electronically Signed   By: Jacqulynn Cadet M.D.   On: 07/21/2017 09:54    Microbiology: Recent Results (from the past 240 hour(s))  Culture, Urine     Status: Abnormal   Collection Time: 07/29/17 10:53 PM  Result Value Ref Range Status   Specimen Description URINE, CLEAN CATCH  Final   Special Requests NONE  Final   Culture (A)  Final    <10,000 COLONIES/mL INSIGNIFICANT GROWTH Performed at [facility], [address], [place], [place] [address]  Report Status 07/31/2017 FINAL  Final     Labs: Basic Metabolic Panel: Recent Labs  Lab 07/29/17 1540 07/30/17 0507 07/31/17 0421 08/01/17 0419  NA 142 140 140 144  K 3.8 3.4* 3.8 3.9  CL  --  107 106 112*  CO2 23 27 28 27   GLUCOSE 74 81 124* 121*  BUN 4.2* <5* 8 <5*  CREATININE 0.6 0.57 0.70 0.57  CALCIUM 8.7 8.5* 8.6* 8.8*  MG  --  1.7 2.3  --   PHOS  --  4.4  --   --    Liver Function Tests: Recent Labs  Lab 07/29/17 1540 07/30/17 0507  AST 19 16  ALT 12 12*  ALKPHOS 29* 24*  BILITOT 0.39 0.9  PROT 6.3* 5.8*  ALBUMIN 3.8 3.5   Recent Labs  Lab 07/29/17 1540  LIPASE 27  AMYLASE 39   No results for input(s): AMMONIA in the last 168 hours. CBC: Recent Labs  Lab 07/29/17 1540 07/30/17 0507 07/31/17 0421 08/01/17 0419  WBC 3.5* 3.9* 3.6* 3.5*   NEUTROABS 1.5  --   --   --   HGB 11.0* 10.1* 9.8* 10.2*  HCT 33.7* 31.3* 29.7* 31.2*  MCV 87.3 87.4 87.4 87.4  PLT 163 144* 142* 157   Cardiac Enzymes: No results for input(s): CKTOTAL, CKMB, CKMBINDEX, TROPONINI in the last 168 hours. BNP: BNP (last 3 results) No results for input(s): BNP in the last 8760 hours.  ProBNP (last 3 results) No results for input(s): PROBNP in the last 8760 hours.  CBG: Recent Labs  Lab 07/30/17 0738  GLUCAP 73       Signed:  Irine Seal MD.  [facility] 08/01/2017, 2:18 PM

## 2017-08-01 NOTE — Telephone Encounter (Signed)
Scheduled appt per 11/29 sch message - patient is aware of appt date and time.

## 2017-08-02 ENCOUNTER — Telehealth: Payer: Self-pay | Admitting: *Deleted

## 2017-08-02 NOTE — Telephone Encounter (Signed)
Called and gave the patient the date/time for her appt with Dr. Fermin Schwab.

## 2017-08-04 ENCOUNTER — Other Ambulatory Visit: Payer: Self-pay | Admitting: General Surgery

## 2017-08-04 ENCOUNTER — Other Ambulatory Visit: Payer: Federal, State, Local not specified - PPO

## 2017-08-04 ENCOUNTER — Other Ambulatory Visit: Payer: Self-pay | Admitting: Radiology

## 2017-08-04 ENCOUNTER — Ambulatory Visit: Payer: Federal, State, Local not specified - PPO | Admitting: Nurse Practitioner

## 2017-08-05 ENCOUNTER — Other Ambulatory Visit: Payer: Self-pay | Admitting: General Surgery

## 2017-08-05 ENCOUNTER — Encounter (HOSPITAL_COMMUNITY): Payer: Self-pay

## 2017-08-05 ENCOUNTER — Ambulatory Visit (HOSPITAL_COMMUNITY)
Admission: RE | Admit: 2017-08-05 | Discharge: 2017-08-05 | Disposition: A | Discharge status: Home / Self Care | Payer: Federal, State, Local not specified - PPO | Source: Ambulatory Visit | Attending: Hematology | Admitting: Hematology

## 2017-08-05 DIAGNOSIS — C7800 Secondary malignant neoplasm of unspecified lung: Secondary | ICD-10-CM | POA: Diagnosis present

## 2017-08-05 DIAGNOSIS — C221 Intrahepatic bile duct carcinoma: Secondary | ICD-10-CM | POA: Diagnosis present

## 2017-08-05 DIAGNOSIS — C786 Secondary malignant neoplasm of retroperitoneum and peritoneum: Secondary | ICD-10-CM | POA: Insufficient documentation

## 2017-08-05 HISTORY — DX: Personal history of antineoplastic chemotherapy: Z92.21

## 2017-08-05 LAB — CBC WITH DIFFERENTIAL/PLATELET
Basophils Absolute: 0 10*3/uL (ref 0.0–0.1)
Basophils Relative: 0 %
Eosinophils Absolute: 0.1 10*3/uL (ref 0.0–0.7)
Eosinophils Relative: 1 %
HCT: 29.2 % — ABNORMAL LOW (ref 36.0–46.0)
Hemoglobin: 9.4 g/dL — ABNORMAL LOW (ref 12.0–15.0)
Lymphocytes Relative: 52 %
Lymphs Abs: 1.9 10*3/uL (ref 0.7–4.0)
MCH: 28.4 pg (ref 26.0–34.0)
MCHC: 32.2 g/dL (ref 30.0–36.0)
MCV: 88.2 fL (ref 78.0–100.0)
Monocytes Absolute: 0.3 10*3/uL (ref 0.1–1.0)
Monocytes Relative: 7 %
Neutro Abs: 1.5 10*3/uL — ABNORMAL LOW (ref 1.7–7.7)
Neutrophils Relative %: 40 %
Platelets: 156 10*3/uL (ref 150–400)
RBC: 3.31 MIL/uL — ABNORMAL LOW (ref 3.87–5.11)
RDW: 14.3 % (ref 11.5–15.5)
WBC: 3.7 10*3/uL — ABNORMAL LOW (ref 4.0–10.5)

## 2017-08-05 LAB — PROTIME-INR
INR: 0.97
Prothrombin Time: 12.8 seconds (ref 11.4–15.2)

## 2017-08-05 LAB — COMPREHENSIVE METABOLIC PANEL
ALT: 14 U/L (ref 14–54)
AST: 21 U/L (ref 15–41)
Albumin: 3.5 g/dL (ref 3.5–5.0)
Alkaline Phosphatase: 30 U/L — ABNORMAL LOW (ref 38–126)
Anion gap: 5 (ref 5–15)
BUN: 8 mg/dL (ref 6–20)
CO2: 30 mmol/L (ref 22–32)
Calcium: 9 mg/dL (ref 8.9–10.3)
Chloride: 107 mmol/L (ref 101–111)
Creatinine, Ser: 0.68 mg/dL (ref 0.44–1.00)
GFR calc Af Amer: >60 mL/min (ref >60)
GFR calc non Af Amer: >60 mL/min (ref >60)
Glucose, Bld: 127 mg/dL — ABNORMAL HIGH (ref 65–99)
Potassium: 3.6 mmol/L (ref 3.5–5.1)
Sodium: 142 mmol/L (ref 135–145)
Total Bilirubin: 0.2 mg/dL — ABNORMAL LOW (ref 0.3–1.2)
Total Protein: 5.9 g/dL — ABNORMAL LOW (ref 6.5–8.1)

## 2017-08-05 MED ORDER — OXYCODONE HCL 5 MG PO TABS
5.0000 mg | ORAL_TABLET | ORAL | Status: DC | PRN
  Filled 2017-08-05: qty 1

## 2017-08-05 MED ORDER — SODIUM CHLORIDE 0.9 % IV SOLN
INTRAVENOUS | Status: DC
  Administered 2017-08-05: 08:00:00 via INTRAVENOUS

## 2017-08-05 MED ORDER — MIDAZOLAM HCL 2 MG/2ML IJ SOLN
INTRAMUSCULAR | Status: AC | PRN
  Administered 2017-08-05 (×2): 1 mg via INTRAVENOUS

## 2017-08-05 MED ORDER — MIDAZOLAM HCL 2 MG/2ML IJ SOLN
INTRAMUSCULAR | Status: AC
  Filled 2017-08-05: qty 2

## 2017-08-05 MED ORDER — HEPARIN SOD (PORK) LOCK FLUSH 100 UNIT/ML IV SOLN
500.0000 [IU] | INTRAVENOUS | Status: AC | PRN
  Administered 2017-08-05: 500 [IU]
  Filled 2017-08-05: qty 5

## 2017-08-05 MED ORDER — FENTANYL CITRATE (PF) 100 MCG/2ML IJ SOLN
INTRAMUSCULAR | Status: AC | PRN
  Administered 2017-08-05 (×2): 50 ug via INTRAVENOUS

## 2017-08-05 MED ORDER — LIDOCAINE HCL (PF) 1 % IJ SOLN
INTRAMUSCULAR | Status: AC | PRN
  Administered 2017-08-05: 20 mL

## 2017-08-05 MED ORDER — FENTANYL CITRATE (PF) 100 MCG/2ML IJ SOLN
INTRAMUSCULAR | Status: AC
  Filled 2017-08-05: qty 2

## 2017-08-05 NOTE — H&P (Signed)
Chief Complaint: cholangiocarcinoma with omental nodule  Referring Physician:Dr. Truitt Merle  Supervising Physician: Markus Daft  Patient Status: [facility] - Out-pt  HPI: Nancy [name] is a 51 y.o. female who has been diagnosed with cholangiocarcinoma.  She is currently undergoing chemotherapy.  She has significant abdominal pain at times and takes multiple different narcotics to control her pain.  She has recently been having more left sided abdominal pain.  Recent imaging has shown a complex cystic solid mass on the left ovary concerning for ovarian cancer with peritoneal carcinomatosis.  A request for a biopsy has been made to determine etiology of these new abdominal findings.  She admits to some chills recently, but no fevers, CP, or SOB.  Past Medical History:  Past Medical History:  Diagnosis Date  . Allergy    SEASONAL  . Anemia    [employer]  . Atopic eczema   . cholangio ca dx'd 07/12/16  . Fibrocystic breast disease   . GERD (gastroesophageal reflux disease)   . Hx of migraines    seasonal   . Personal history of chemotherapy   . Pneumonia    6 years ago   . TIA (transient ischemic attack)     Past Surgical History:  Past Surgical History:  Procedure Laterality Date  . EUS N/A 07/07/2016   Procedure: UPPER ENDOSCOPIC ULTRASOUND (EUS) RADIAL;  Surgeon: [name], MD;  Location: [facility] ENDOSCOPY;  Service: Endoscopy;  Laterality: N/A;  . IR GENERIC HISTORICAL  08/03/2016   IR US GUIDE VASC ACCESS RIGHT 08/03/2016 Corrie Mckusick, DO [facility]-INTER[facility]  . IR GENERIC HISTORICAL  08/03/2016   IR FLUORO GUIDE PORT INSERTION RIGHT 08/03/2016 Corrie Mckusick, DO [facility]  . MYOMECTOMY  2002    Family History:  Family History  Problem Relation Age of Onset  . Gout Mother   . Diabetes Mother   . Hypertension Father   . Diabetes Father   . Breast cancer Paternal Aunt   . Cancer Maternal Grandfather        throat cancer   . Cancer Cousin 50       GI cancer   . Thyroid  disease Neg Hx     Social History:  reports that  has never smoked. she has never used smokeless tobacco. She reports that she drinks alcohol. She reports that she does not use drugs.  Allergies:  Allergies  Allergen Reactions  . Penicillins Anaphylaxis    Has patient had a PCN reaction causing immediate rash, facial/tongue/throat swelling, SOB or lightheadedness with hypotensionYes Swelling in throat  Has patient had a PCN reaction causing severe rash involving mucus membranes or skin necrosis:/No Has patient had a PCN reaction that required hospitalization/No Has patient had a PCN reaction occurring within the last 10 years:NO If all of the above answers are "NO", then may proceed with Cephalosporin use.   . Contrast Media [Iodinated Diagnostic Agents] Other (See Comments)    Tingling under left rib area   . Lipitor [Atorvastatin] Palpitations    Tingling, flushing    Medications: Medications reviewed in epic  Please HPI for pertinent positives, otherwise complete 10 system ROS negative.  Mallampati Score: MD Evaluation Airway: WNL Heart: WNL Abdomen: WNL Chest/ Lungs: Other (comments) Chest/ lungs comments: port in right upper chest ASA  Classification: 3 Mallampati/Airway Score: One  Physical Exam: BP 124/76   Pulse 80   Temp 98.9 F (37.2 C) (Oral)   Resp 16   Ht '5\' 6"'$  (1.676 m)  Wt 130 lb (59 kg)   SpO2 100%   BMI 20.98 kg/m  Body mass index is 20.98 kg/m. General: pleasant, ill-appearing black female who is laying in bed in NAD HEENT: head is normocephalic, atraumatic.  Sclera are noninjected.  PERRL.  Ears and nose without any masses or lesions.  Mouth is pink and moist Heart: regular, rate, and rhythm.  Normal s1,s2. No obvious murmurs, gallops, or rubs noted.  Palpable radial pulses bilaterally Lungs: CTAB, no wheezes, rhonchi, or rales noted.  Respiratory effort nonlabored Abd: soft, NT, ND, +BS, no masses, hernias, or organomegaly Psych: A&Ox3 with an  appropriate affect, but somewhat lethargic at times.   Labs: Results for orders placed or performed during the hospital encounter of 08/05/17 (from the past 48 hour(s))  CBC with Differential/Platelet     Status: Abnormal   Collection Time: 08/05/17  7:29 AM  Result Value Ref Range   WBC 3.7 (L) 4.0 - 10.5 K/uL   RBC 3.31 (L) 3.87 - 5.11 MIL/uL   Hemoglobin 9.4 (L) 12.0 - 15.0 g/dL   HCT 29.2 (L) 36.0 - 46.0 %   MCV 88.2 78.0 - 100.0 fL   MCH 28.4 26.0 - 34.0 pg   MCHC 32.2 30.0 - 36.0 g/dL   RDW 14.3 11.5 - 15.5 %   Platelets 156 150 - 400 K/uL   Neutrophils Relative % 40 %   Neutro Abs 1.5 (L) 1.7 - 7.7 K/uL   Lymphocytes Relative 52 %   Lymphs Abs 1.9 0.7 - 4.0 K/uL   Monocytes Relative 7 %   Monocytes Absolute 0.3 0.1 - 1.0 K/uL   Eosinophils Relative 1 %   Eosinophils Absolute 0.1 0.0 - 0.7 K/uL   Basophils Relative 0 %   Basophils Absolute 0.0 0.0 - 0.1 K/uL  Comprehensive metabolic panel     Status: Abnormal   Collection Time: 08/05/17  7:29 AM  Result Value Ref Range   Sodium 142 135 - 145 mmol/L   Potassium 3.6 3.5 - 5.1 mmol/L   Chloride 107 101 - 111 mmol/L   CO2 30 22 - 32 mmol/L   Glucose, Bld 127 (H) 65 - 99 mg/dL   BUN 8 6 - 20 mg/dL   Creatinine, Ser 0.68 0.44 - 1.00 mg/dL   Calcium 9.0 8.9 - 10.3 mg/dL   Total Protein 5.9 (L) 6.5 - 8.1 g/dL   Albumin 3.5 3.5 - 5.0 g/dL   AST 21 15 - 41 U/L   ALT 14 14 - 54 U/L   Alkaline Phosphatase 30 (L) 38 - 126 U/L   Total Bilirubin 0.2 (L) 0.3 - 1.2 mg/dL   GFR calc non Af Amer >60 >60 mL/min   GFR calc Af Amer >60 >60 mL/min    Comment: (NOTE) The eGFR has been calculated using the CKD EPI equation. This calculation has not been validated in all clinical situations. eGFR's persistently <60 mL/min signify possible Chronic Kidney Disease.    Anion gap 5 5 - 15  Protime-INR     Status: None   Collection Time: 08/05/17  7:29 AM  Result Value Ref Range   Prothrombin Time 12.8 11.4 - 15.2 seconds   INR 0.97       Imaging: No results found.  Assessment/Plan 1. Omental nodules  The patient has a history of cholangiocarcinoma and is undergoing treatment.  New abdominal pain has resulted in recent CT scan findings concerning for ovarian cancer with metastatic disease.  We have been asked to  biopsy an omental nodule for confirmation.  Labs and vitals have been reviewed. Risks and benefits discussed with the patient including, but not limited to bleeding, infection, damage to adjacent structures or low yield requiring additional tests. All of the patient's questions were answered, patient is agreeable to proceed. Consent signed and in chart.  Thank you for this interesting consult.  I greatly enjoyed meeting Nancy [name] and look forward to participating in their care.  A copy of this report was sent to the requesting provider on this date.  Electronically Signed: Henreitta Cea 08/05/2017, 8:42 AM   I spent a total of  30 Minutes   in face to face in clinical consultation, greater than 50% of which was counseling/coordinating care for omental nodule

## 2017-08-05 NOTE — Discharge Instructions (Signed)
Moderate Conscious Sedation, Adult, Care After These instructions provide you with information about caring for yourself after your procedure. Your health care provider may also give you more specific instructions. Your treatment has been planned according to current medical practices, but problems sometimes occur. Call your health care provider if you have any problems or questions after your procedure. What can I expect after the procedure? After your procedure, it is common:  To feel sleepy for several hours.  To feel clumsy and have poor balance for several hours.  To have poor judgment for several hours.  To vomit if you eat too soon.  Follow these instructions at home: For at least 24 hours after the procedure:   Do not: ? Participate in activities where you could fall or become injured. ? Drive. ? Use heavy machinery. ? Drink alcohol. ? Take sleeping pills or medicines that cause drowsiness. ? Make important decisions or sign legal documents. ? Take care of children on your own.  Rest. Eating and drinking  Follow the diet recommended by your health care provider.  If you vomit: ? Drink water, juice, or soup when you can drink without vomiting. ? Make sure you have little or no nausea before eating solid foods. General instructions  Have a responsible adult stay with you until you are awake and alert.  Take over-the-counter and prescription medicines only as told by your health care provider.  If you smoke, do not smoke without supervision.  Keep all follow-up visits as told by your health care provider. This is important. Contact a health care provider if:  You keep feeling nauseous or you keep vomiting.  You feel light-headed.  You develop a rash.  You have a fever. Get help right away if:  You have trouble breathing. This information is not intended to replace advice given to you by your health care provider. Make sure you discuss any questions you have  with your health care provider. Document Released: 06/06/2013 Document Revised: 01/19/2016 Document Reviewed: 12/06/2015 Elsevier Interactive Patient Education  2018 [employer].   Needle Biopsy, Care After These instructions give you information about caring for yourself after your procedure. Your doctor may also give you more specific instructions. Call your doctor if you have any problems or questions after your procedure. Follow these instructions at home:  Rest as told by your doctor.  Take medicines only as told by your doctor.  There are many different ways to close and cover the biopsy site, including stitches (sutures), skin glue, and adhesive strips. Follow instructions from your doctor about: ? How to take care of your biopsy site. ? When and how you should change your bandage (dressing). ? When you should remove your dressing. ? Removing whatever was used to close your biopsy site.  Check your biopsy site every day for signs of infection. Watch for: ? Redness, swelling, or pain. ? Fluid, blood, or pus. Contact a doctor if:  You have a fever.  You have redness, swelling, or pain at the biopsy site, and it lasts longer than a few days.  You have fluid, blood, or pus coming from the biopsy site.  You feel sick to your stomach (nauseous).  You throw up (vomit). Get help right away if:  You are short of breath.  You have trouble breathing.  Your chest hurts.  You feel dizzy or you pass out (faint).  You have bleeding that does not stop with pressure or a bandage.  You cough up blood.  Your belly (abdomen) hurts. This information is not intended to replace advice given to you by your health care provider. Make sure you discuss any questions you have with your health care provider. Document Released: 07/29/2008 Document Revised: 01/22/2016 Document Reviewed: 08/12/2014 Elsevier Interactive Patient Education  [employer].

## 2017-08-07 ENCOUNTER — Emergency Department (HOSPITAL_BASED_OUTPATIENT_CLINIC_OR_DEPARTMENT_OTHER)
Admission: EM | Admit: 2017-08-07 | Discharge: 2017-08-08 | Disposition: A | Discharge status: Home / Self Care | Payer: Federal, State, Local not specified - PPO | Source: Home / Self Care | Attending: Emergency Medicine | Admitting: Emergency Medicine

## 2017-08-07 ENCOUNTER — Encounter (HOSPITAL_BASED_OUTPATIENT_CLINIC_OR_DEPARTMENT_OTHER): Payer: Self-pay | Admitting: *Deleted

## 2017-08-07 ENCOUNTER — Other Ambulatory Visit: Payer: Self-pay

## 2017-08-07 DIAGNOSIS — R1012 Left upper quadrant pain: Secondary | ICD-10-CM

## 2017-08-07 DIAGNOSIS — Z8673 Personal history of transient ischemic attack (TIA), and cerebral infarction without residual deficits: Secondary | ICD-10-CM | POA: Insufficient documentation

## 2017-08-07 DIAGNOSIS — Z7982 Long term (current) use of aspirin: Secondary | ICD-10-CM | POA: Insufficient documentation

## 2017-08-07 DIAGNOSIS — Z85118 Personal history of other malignant neoplasm of bronchus and lung: Secondary | ICD-10-CM | POA: Insufficient documentation

## 2017-08-07 DIAGNOSIS — Z9221 Personal history of antineoplastic chemotherapy: Secondary | ICD-10-CM | POA: Insufficient documentation

## 2017-08-07 DIAGNOSIS — Z79899 Other long term (current) drug therapy: Secondary | ICD-10-CM

## 2017-08-07 DIAGNOSIS — E876 Hypokalemia: Secondary | ICD-10-CM | POA: Diagnosis not present

## 2017-08-07 DIAGNOSIS — G893 Neoplasm related pain (acute) (chronic): Secondary | ICD-10-CM | POA: Insufficient documentation

## 2017-08-07 DIAGNOSIS — C221 Intrahepatic bile duct carcinoma: Secondary | ICD-10-CM | POA: Insufficient documentation

## 2017-08-07 DIAGNOSIS — C7800 Secondary malignant neoplasm of unspecified lung: Secondary | ICD-10-CM

## 2017-08-07 DIAGNOSIS — R1010 Upper abdominal pain, unspecified: Secondary | ICD-10-CM

## 2017-08-07 LAB — HEPATIC FUNCTION PANEL
ALT: 13 U/L — ABNORMAL LOW (ref 14–54)
AST: 22 U/L (ref 15–41)
Albumin: 3.4 g/dL — ABNORMAL LOW (ref 3.5–5.0)
Alkaline Phosphatase: 30 U/L — ABNORMAL LOW (ref 38–126)
Bilirubin, Direct: 0.1 mg/dL (ref 0.1–0.5)
Indirect Bilirubin: 0.3 mg/dL (ref 0.3–0.9)
Total Bilirubin: 0.4 mg/dL (ref 0.3–1.2)
Total Protein: 5.9 g/dL — ABNORMAL LOW (ref 6.5–8.1)

## 2017-08-07 LAB — CBC WITH DIFFERENTIAL/PLATELET
Basophils Absolute: 0 10*3/uL (ref 0.0–0.1)
Basophils Relative: 0 %
Eosinophils Absolute: 0.1 10*3/uL (ref 0.0–0.7)
Eosinophils Relative: 2 %
HCT: 28.1 % — ABNORMAL LOW (ref 36.0–46.0)
Hemoglobin: 9 g/dL — ABNORMAL LOW (ref 12.0–15.0)
Lymphocytes Relative: 51 %
Lymphs Abs: 1.8 10*3/uL (ref 0.7–4.0)
MCH: 28.2 pg (ref 26.0–34.0)
MCHC: 32 g/dL (ref 30.0–36.0)
MCV: 88.1 fL (ref 78.0–100.0)
Monocytes Absolute: 0.3 10*3/uL (ref 0.1–1.0)
Monocytes Relative: 9 %
Neutro Abs: 1.4 10*3/uL — ABNORMAL LOW (ref 1.7–7.7)
Neutrophils Relative %: 38 %
Platelets: 185 10*3/uL (ref 150–400)
RBC: 3.19 MIL/uL — ABNORMAL LOW (ref 3.87–5.11)
RDW: 13.8 % (ref 11.5–15.5)
WBC: 3.6 10*3/uL — ABNORMAL LOW (ref 4.0–10.5)

## 2017-08-07 LAB — BASIC METABOLIC PANEL
Anion gap: 6 (ref 5–15)
BUN: 6 mg/dL (ref 6–20)
CO2: 26 mmol/L (ref 22–32)
Calcium: 8.6 mg/dL — ABNORMAL LOW (ref 8.9–10.3)
Chloride: 109 mmol/L (ref 101–111)
Creatinine, Ser: 0.53 mg/dL (ref 0.44–1.00)
GFR calc Af Amer: >60 mL/min (ref >60)
GFR calc non Af Amer: >60 mL/min (ref >60)
Glucose, Bld: 91 mg/dL (ref 65–99)
Potassium: 3.5 mmol/L (ref 3.5–5.1)
Sodium: 141 mmol/L (ref 135–145)

## 2017-08-07 LAB — LIPASE, BLOOD: Lipase: 23 U/L (ref 11–51)

## 2017-08-07 MED ORDER — SODIUM CHLORIDE 0.9 % IV SOLN
Freq: Once | INTRAVENOUS | Status: AC
  Administered 2017-08-07: via INTRAVENOUS

## 2017-08-07 MED ORDER — ONDANSETRON HCL 4 MG/2ML IJ SOLN
4.0000 mg | Freq: Once | INTRAMUSCULAR | Status: AC
  Administered 2017-08-07: 4 mg via INTRAVENOUS
  Filled 2017-08-07: qty 2

## 2017-08-07 MED ORDER — HYDROMORPHONE HCL 1 MG/ML IJ SOLN
2.0000 mg | Freq: Once | INTRAMUSCULAR | Status: AC
  Administered 2017-08-07: 2 mg via INTRAVENOUS
  Filled 2017-08-07: qty 2

## 2017-08-07 NOTE — ED Triage Notes (Signed)
C/o pain to upper left abdomen that started for past 6 hours. State she is out of her pain medications. States she took some xanax this afternoon which helped a little, but states she is here for pain control. Describes pain as sharp and constant. Denies any fevers. Denies any urinary symptoms. Denies any n/v. States she had a biospy on Friday and awaiting results.

## 2017-08-07 NOTE — ED Notes (Signed)
Alert, NAD, calm, interactive, resps e/u, speaking in clear complete sentences, no dyspnea noted, skin W&D. Family at [facility].
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
## 2017-08-08 ENCOUNTER — Encounter (HOSPITAL_BASED_OUTPATIENT_CLINIC_OR_DEPARTMENT_OTHER): Payer: Self-pay | Admitting: Emergency Medicine

## 2017-08-08 ENCOUNTER — Other Ambulatory Visit: Payer: Self-pay

## 2017-08-08 ENCOUNTER — Inpatient Hospital Stay (HOSPITAL_COMMUNITY)
Admission: EM | Admit: 2017-08-08 | Discharge: 2017-08-10 | DRG: 947 | Disposition: A | Discharge status: Home / Self Care | Payer: Federal, State, Local not specified - PPO | Attending: Internal Medicine | Admitting: Internal Medicine

## 2017-08-08 ENCOUNTER — Encounter (HOSPITAL_COMMUNITY): Payer: Self-pay | Admitting: Emergency Medicine

## 2017-08-08 ENCOUNTER — Emergency Department (HOSPITAL_BASED_OUTPATIENT_CLINIC_OR_DEPARTMENT_OTHER): Payer: Federal, State, Local not specified - PPO

## 2017-08-08 ENCOUNTER — Emergency Department (HOSPITAL_BASED_OUTPATIENT_CLINIC_OR_DEPARTMENT_OTHER)
Admission: EM | Admit: 2017-08-08 | Discharge: 2017-08-08 | Disposition: A | Discharge status: Home / Self Care | Payer: Federal, State, Local not specified - PPO | Source: Home / Self Care | Attending: Emergency Medicine | Admitting: Emergency Medicine

## 2017-08-08 DIAGNOSIS — N83209 Unspecified ovarian cyst, unspecified side: Secondary | ICD-10-CM | POA: Diagnosis present

## 2017-08-08 DIAGNOSIS — R110 Nausea: Secondary | ICD-10-CM

## 2017-08-08 DIAGNOSIS — Z6820 Body mass index (BMI) 20.0-20.9, adult: Secondary | ICD-10-CM

## 2017-08-08 DIAGNOSIS — K5900 Constipation, unspecified: Secondary | ICD-10-CM | POA: Diagnosis present

## 2017-08-08 DIAGNOSIS — R1012 Left upper quadrant pain: Secondary | ICD-10-CM

## 2017-08-08 DIAGNOSIS — E43 Unspecified severe protein-calorie malnutrition: Secondary | ICD-10-CM | POA: Diagnosis present

## 2017-08-08 DIAGNOSIS — D638 Anemia in other chronic diseases classified elsewhere: Secondary | ICD-10-CM | POA: Diagnosis present

## 2017-08-08 DIAGNOSIS — E860 Dehydration: Secondary | ICD-10-CM | POA: Diagnosis present

## 2017-08-08 DIAGNOSIS — C221 Intrahepatic bile duct carcinoma: Secondary | ICD-10-CM | POA: Diagnosis present

## 2017-08-08 DIAGNOSIS — F1123 Opioid dependence with withdrawal: Secondary | ICD-10-CM | POA: Diagnosis present

## 2017-08-08 DIAGNOSIS — R109 Unspecified abdominal pain: Secondary | ICD-10-CM | POA: Diagnosis present

## 2017-08-08 DIAGNOSIS — G893 Neoplasm related pain (acute) (chronic): Principal | ICD-10-CM | POA: Diagnosis present

## 2017-08-08 DIAGNOSIS — R112 Nausea with vomiting, unspecified: Secondary | ICD-10-CM | POA: Diagnosis present

## 2017-08-08 DIAGNOSIS — G629 Polyneuropathy, unspecified: Secondary | ICD-10-CM | POA: Diagnosis present

## 2017-08-08 DIAGNOSIS — C7800 Secondary malignant neoplasm of unspecified lung: Secondary | ICD-10-CM | POA: Diagnosis present

## 2017-08-08 DIAGNOSIS — D72819 Decreased white blood cell count, unspecified: Secondary | ICD-10-CM | POA: Diagnosis present

## 2017-08-08 DIAGNOSIS — E440 Moderate protein-calorie malnutrition: Secondary | ICD-10-CM

## 2017-08-08 DIAGNOSIS — E876 Hypokalemia: Secondary | ICD-10-CM | POA: Diagnosis present

## 2017-08-08 DIAGNOSIS — R030 Elevated blood-pressure reading, without diagnosis of hypertension: Secondary | ICD-10-CM | POA: Diagnosis present

## 2017-08-08 DIAGNOSIS — D630 Anemia in neoplastic disease: Secondary | ICD-10-CM | POA: Diagnosis present

## 2017-08-08 LAB — CBC WITH DIFFERENTIAL/PLATELET
Basophils Absolute: 0 10*3/uL (ref 0.0–0.1)
Basophils Relative: 0 %
Eosinophils Absolute: 0 10*3/uL (ref 0.0–0.7)
Eosinophils Relative: 1 %
HCT: 29.5 % — ABNORMAL LOW (ref 36.0–46.0)
Hemoglobin: 9.7 g/dL — ABNORMAL LOW (ref 12.0–15.0)
Lymphocytes Relative: 28 %
Lymphs Abs: 1 10*3/uL (ref 0.7–4.0)
MCH: 28.4 pg (ref 26.0–34.0)
MCHC: 32.9 g/dL (ref 30.0–36.0)
MCV: 86.3 fL (ref 78.0–100.0)
Monocytes Absolute: 0.3 10*3/uL (ref 0.1–1.0)
Monocytes Relative: 7 %
Neutro Abs: 2.4 10*3/uL (ref 1.7–7.7)
Neutrophils Relative %: 64 %
Platelets: 205 10*3/uL (ref 150–400)
RBC: 3.42 MIL/uL — ABNORMAL LOW (ref 3.87–5.11)
RDW: 13.5 % (ref 11.5–15.5)
WBC: 3.7 10*3/uL — ABNORMAL LOW (ref 4.0–10.5)

## 2017-08-08 LAB — COMPREHENSIVE METABOLIC PANEL
ALT: 16 U/L (ref 14–54)
ALT: 18 U/L (ref 14–54)
AST: 24 U/L (ref 15–41)
AST: 32 U/L (ref 15–41)
Albumin: 3.7 g/dL (ref 3.5–5.0)
Albumin: 4.3 g/dL (ref 3.5–5.0)
Alkaline Phosphatase: 35 U/L — ABNORMAL LOW (ref 38–126)
Alkaline Phosphatase: 40 U/L (ref 38–126)
Anion gap: 8 (ref 5–15)
Anion gap: 9 (ref 5–15)
BUN: >5 mg/dL — ABNORMAL LOW (ref 6–20)
BUN: <5 mg/dL — ABNORMAL LOW (ref 6–20)
CO2: 25 mmol/L (ref 22–32)
CO2: 26 mmol/L (ref 22–32)
Calcium: 8.8 mg/dL — ABNORMAL LOW (ref 8.9–10.3)
Calcium: 9 mg/dL (ref 8.9–10.3)
Chloride: 106 mmol/L (ref 101–111)
Chloride: 104 mmol/L (ref 101–111)
Creatinine, Ser: 0.55 mg/dL (ref 0.44–1.00)
Creatinine, Ser: 0.57 mg/dL (ref 0.44–1.00)
GFR calc Af Amer: >60 mL/min (ref >60)
GFR calc Af Amer: >60 mL/min (ref >60)
GFR calc non Af Amer: >60 mL/min (ref >60)
GFR calc non Af Amer: >60 mL/min (ref >60)
Glucose, Bld: 100 mg/dL — ABNORMAL HIGH (ref 65–99)
Glucose, Bld: 106 mg/dL — ABNORMAL HIGH (ref 65–99)
Potassium: 3.4 mmol/L — ABNORMAL LOW (ref 3.5–5.1)
Potassium: 2.9 mmol/L — ABNORMAL LOW (ref 3.5–5.1)
Sodium: 139 mmol/L (ref 135–145)
Sodium: 139 mmol/L (ref 135–145)
Total Bilirubin: 0.3 mg/dL (ref 0.3–1.2)
Total Bilirubin: 0.5 mg/dL (ref 0.3–1.2)
Total Protein: 6.3 g/dL — ABNORMAL LOW (ref 6.5–8.1)
Total Protein: 7.1 g/dL (ref 6.5–8.1)

## 2017-08-08 LAB — CBC
HCT: 32.1 % — ABNORMAL LOW (ref 36.0–46.0)
Hemoglobin: 10.8 g/dL — ABNORMAL LOW (ref 12.0–15.0)
MCH: 28.8 pg (ref 26.0–34.0)
MCHC: 33.6 g/dL (ref 30.0–36.0)
MCV: 85.6 fL (ref 78.0–100.0)
Platelets: 225 10*3/uL (ref 150–400)
RBC: 3.75 MIL/uL — ABNORMAL LOW (ref 3.87–5.11)
RDW: 14.1 % (ref 11.5–15.5)
WBC: 3.8 10*3/uL — ABNORMAL LOW (ref 4.0–10.5)

## 2017-08-08 LAB — URINALYSIS, ROUTINE W REFLEX MICROSCOPIC
Bilirubin Urine: NEGATIVE
Glucose, UA: NEGATIVE mg/dL
Hgb urine dipstick: NEGATIVE
Ketones, ur: 20 mg/dL — AB
Leukocytes, UA: NEGATIVE
Nitrite: NEGATIVE
Protein, ur: NEGATIVE mg/dL
Specific Gravity, Urine: 1.009 (ref 1.005–1.030)
pH: 8 (ref 5.0–8.0)

## 2017-08-08 LAB — LIPASE, BLOOD
Lipase: 20 U/L (ref 11–51)
Lipase: 21 U/L (ref 11–51)

## 2017-08-08 LAB — I-STAT BETA HCG BLOOD, ED (MC, WL, AP ONLY): I-stat hCG, quantitative: <5 m[IU]/mL (ref <5)

## 2017-08-08 MED ORDER — HYDROMORPHONE HCL 1 MG/ML IJ SOLN
1.0000 mg | Freq: Once | INTRAMUSCULAR | Status: AC
  Administered 2017-08-08: 1 mg via INTRAVENOUS

## 2017-08-08 MED ORDER — HYDROMORPHONE HCL 1 MG/ML IJ SOLN
1.0000 mg | Freq: Once | INTRAMUSCULAR | Status: AC
  Administered 2017-08-08: 1 mg via INTRAVENOUS
  Filled 2017-08-08: qty 1

## 2017-08-08 MED ORDER — POTASSIUM CHLORIDE 10 MEQ/100ML IV SOLN
10.0000 meq | INTRAVENOUS | Status: AC
  Administered 2017-08-08 – 2017-08-09 (×2): 10 meq via INTRAVENOUS
  Filled 2017-08-08 (×2): qty 100

## 2017-08-08 MED ORDER — OXYCODONE HCL 5 MG PO TABS: 5.0000 mg | ORAL_TABLET | ORAL | 0 refills | Status: DC | PRN

## 2017-08-08 MED ORDER — SODIUM CHLORIDE 0.9 % IV SOLN
INTRAVENOUS | Status: DC
  Administered 2017-08-08: 16:00:00 via INTRAVENOUS

## 2017-08-08 MED ORDER — HEPARIN SOD (PORK) LOCK FLUSH 100 UNIT/ML IV SOLN
500.0000 [IU] | Freq: Once | INTRAVENOUS | Status: AC
  Administered 2017-08-08: 500 [IU]
  Filled 2017-08-08: qty 5

## 2017-08-08 MED ORDER — HEPARIN SOD (PORK) LOCK FLUSH 100 UNIT/ML IV SOLN
INTRAVENOUS | Status: AC
  Administered 2017-08-08: 500 [IU]
  Filled 2017-08-08: qty 5

## 2017-08-08 MED ORDER — ONDANSETRON HCL 4 MG/2ML IJ SOLN
INTRAMUSCULAR | Status: AC
  Filled 2017-08-08: qty 2

## 2017-08-08 MED ORDER — ONDANSETRON HCL 4 MG/2ML IJ SOLN
4.0000 mg | Freq: Once | INTRAMUSCULAR | Status: AC
  Administered 2017-08-08: 4 mg via INTRAVENOUS

## 2017-08-08 MED ORDER — SODIUM CHLORIDE 0.9 % IV BOLUS (SEPSIS)
1000.0000 mL | Freq: Once | INTRAVENOUS | Status: AC
  Administered 2017-08-08: 1000 mL via INTRAVENOUS

## 2017-08-08 MED ORDER — HYDROMORPHONE HCL 1 MG/ML IJ SOLN
INTRAMUSCULAR | Status: AC
  Filled 2017-08-08: qty 1

## 2017-08-08 MED ORDER — FENTANYL CITRATE (PF) 100 MCG/2ML IJ SOLN
50.0000 ug | INTRAMUSCULAR | Status: DC | PRN
  Administered 2017-08-08: 50 ug via INTRAVENOUS
  Filled 2017-08-08: qty 2

## 2017-08-08 MED ORDER — OXYCODONE HCL ER 40 MG PO T12A: 40.0000 mg | EXTENDED_RELEASE_TABLET | Freq: Two times a day (BID) | ORAL | 0 refills | Status: DC

## 2017-08-08 MED ORDER — POTASSIUM CHLORIDE CRYS ER 20 MEQ PO TBCR
40.0000 meq | EXTENDED_RELEASE_TABLET | Freq: Once | ORAL | Status: AC
  Administered 2017-08-08: 40 meq via ORAL
  Filled 2017-08-08: qty 2

## 2017-08-08 MED ORDER — HYDROMORPHONE HCL 2 MG PO TABS: 2.0000 mg | ORAL_TABLET | ORAL | 0 refills | Status: DC | PRN

## 2017-08-08 NOTE — ED Provider Notes (Signed)
[facility] DEPT Provider Note   CSN: [id number] Arrival date & time: 08/08/17  2029     History   Chief Complaint Chief Complaint  Patient presents with  . Abdominal Pain    HPI Nancy [name] is a 51 y.o. female.  The history is provided by the patient, medical records and a significant other. No language interpreter was used.  Abdominal Pain   This is a recurrent problem. The current episode started yesterday. The problem occurs constantly. The problem has not changed since onset.The pain is associated with an unknown factor. The pain is located in the LUQ and epigastric region. The quality of the pain is aching and sharp. The pain is at a severity of 10/10. The pain is severe. Associated symptoms include nausea and vomiting. Pertinent negatives include fever, diarrhea, constipation, dysuria, frequency and headaches. The symptoms are aggravated by palpation. Nothing relieves the symptoms. Past workup includes CT scan (earlier today).    Past Medical History:  Diagnosis Date  . Allergy    SEASONAL  . Anemia    [employer]  . Atopic eczema   . cholangio ca dx'd 07/12/16  . Fibrocystic breast disease   . GERD (gastroesophageal reflux disease)   . Hx of migraines    seasonal   . Personal history of chemotherapy   . Pneumonia    6 years ago   . TIA (transient ischemic attack)     Patient Active Problem List   Diagnosis Date Noted  . Hypokalemia   . Acute neoplasm-related pain 07/29/2017  . Abdominal pain, acute, left upper quadrant 07/29/2017  . Dehydration 07/29/2017  . Oral candidiasis 07/19/2017  . Neoplasm related pain 07/19/2017  . Anemia due to antineoplastic chemotherapy 02/13/2017  . Goals of care, counseling/discussion 10/04/2016  . Cerumen impaction 09/16/2016  . Abnormal finding on GI tract imaging   . Metastatic cholangiocarcinoma to lung ([place])   . Acute bronchitis 10/01/2015  . Vestibular migraine 06/20/2015  . TIA  (transient ischemic attack) 06/12/2015  . Hyperlipidemia LDL goal <70 06/12/2015  . Diplopia 06/09/2015  . Vertigo 06/09/2015  . Right leg pain 04/12/2014  . Hyperthyroidism 03/13/2014  . MAMMOGRAM, ABNORMAL 10/31/2008  . ECZEMA, ATOPIC 10/15/2008  . FIBROCYSTIC BREAST DISEASE, HX OF 10/15/2008    Past Surgical History:  Procedure Laterality Date  . EUS N/A 07/07/2016   Procedure: UPPER ENDOSCOPIC ULTRASOUND (EUS) RADIAL;  Surgeon: [name], MD;  Location: [facility];  Service: Endoscopy;  Laterality: N/A;  . IR GENERIC HISTORICAL  08/03/2016   IR US GUIDE VASC ACCESS RIGHT 08/03/2016 Corrie Mckusick, DO [facility]  . IR GENERIC HISTORICAL  08/03/2016   IR FLUORO GUIDE PORT INSERTION RIGHT 08/03/2016 Corrie Mckusick, DO [facility]  . MYOMECTOMY  2002    OB History    No data available       Home Medications    Prior to Admission medications   Medication Sig Start Date End Date Taking? Authorizing Provider  ALPRAZolam Duanne Moron) 0.5 MG tablet Take 1 tablet (0.5 mg total) by mouth 3 (three) times daily as needed for anxiety. 08/01/17   Eugenie Filler, MD  aspirin 81 MG tablet Take 81 mg by mouth daily.    [provider]  cetirizine (ZYRTEC) 10 MG tablet Take 10 mg by mouth daily as needed. For allergies     [provider]  Cholecalciferol (VITAMIN D3) 5000 UNITS CAPS Take 1 capsule by mouth daily.    [provider]  COLOSTRUM PO Take 1.25 g by mouth daily. 1.25 grams twice daily     [provider]  dicyclomine (BENTYL) 20 MG tablet 3 (three) times daily as needed.  07/22/16   [provider]  dronabinol (MARINOL) 5 MG capsule Take 1 capsule (5 mg total) by mouth 2 (two) times daily before a meal. 06/08/17   Owens Shark, NP  HYDROmorphone (DILAUDID) 2 MG tablet Take 1 tablet (2 mg total) by mouth every 4 (four) hours as needed for severe pain. 08/08/17   Orpah Greek, MD  lidocaine-prilocaine (EMLA) cream Apply 1  application topically as needed. Apply to portacath 1 1/2 hours - 2 hours prior to procedures as needed. 07/27/17   Truitt Merle, MD  LORazepam (ATIVAN) 0.5 MG tablet Place under tongue and dissolve every 8 hours as needed for nausea. Do not take Xanax while taking this medication. 05/18/17   Tanner, Lyndon Code., PA-C  magnesium hydroxide (MILK OF MAGNESIA) 400 MG/5ML suspension Take 30 mLs by mouth daily as needed for mild constipation.    [provider]  megestrol (MEGACE) 400 MG/10ML suspension Take 10 mLs (400 mg total) by mouth daily. 08/02/17   Eugenie Filler, MD  Menaquinone-7 (VITAMIN K2) 40 MCG TABS Take by mouth.    [provider]  mometasone (ELOCON) 0.1 % lotion Apply topically daily. 08/21/15   Roma Schanz R, DO  OLANZapine (ZYPREXA) 2.5 MG tablet Take 1 tablet (2.5 mg total) by mouth at bedtime. 07/20/17   Tanner, Lyndon Code., PA-C  omeprazole (PRILOSEC) 40 MG capsule Take 1 capsule (40 mg total) by mouth 2 (two) times daily before a meal. 08/01/17   Eugenie Filler, MD  ondansetron (ZOFRAN) 8 MG tablet Take 1 tablet (8 mg total) by mouth every 8 (eight) hours as needed for nausea or vomiting. 07/25/17   Alla Feeling, NP  oxyCODONE (OXY IR/ROXICODONE) 5 MG immediate release tablet Take 1-2 tablets (5-10 mg total) by mouth every 4 (four) hours as needed for severe pain. 08/08/17   Orpah Greek, MD  oxyCODONE (OXYCONTIN) 40 mg 12 hr tablet Take 1 tablet (40 mg total) by mouth every 12 (twelve) hours. 08/08/17   Orpah Greek, MD  polyethylene glycol (MIRALAX / GLYCOLAX) packet Take 17 g by mouth 2 (two) times daily. Hold if you develop diarrhea 08/01/17   Eugenie Filler, MD  promethazine (PHENERGAN) 25 MG tablet Take 1 tablet (25 mg total) by mouth every 6 (six) hours as needed for nausea or vomiting. 05/18/17   Tanner, Lyndon Code., PA-C  scopolamine (TRANSDERM-SCOP) 1 MG/3DAYS Place 1 patch (1.5 mg total) every 3 (three) days onto the skin. 07/13/17    Tanner, Lyndon Code., PA-C  senna (SENOKOT) 8.6 MG TABS tablet Take 2 tablets (17.2 mg total) by mouth 2 (two) times daily. 08/01/17   Eugenie Filler, MD  triamcinolone cream (KENALOG) 0.1 % Apply topically.    [provider]  Trolamine Salicylate (ASPERCREME EX) Apply 1 application topically.    [provider]    Family History Family History  Problem Relation Age of Onset  . Gout Mother   . Diabetes Mother   . Hypertension Father   . Diabetes Father   . Breast cancer Paternal Aunt   . Cancer Maternal Grandfather        throat cancer   . Cancer Cousin 31       GI cancer   . Thyroid disease Neg Hx  Social History Social History   Tobacco Use  . Smoking status: Never Smoker  . Smokeless tobacco: Never Used  Substance Use Topics  . Alcohol use: Yes    Comment: maybe 3 times a year  . Drug use: No     Allergies   Penicillins; Contrast media [iodinated diagnostic agents]; and Lipitor [atorvastatin]   Review of Systems Review of Systems  Constitutional: Negative for chills, diaphoresis, fatigue and fever.  HENT: Negative for congestion.   Respiratory: Negative for cough, chest tightness, shortness of breath, wheezing and stridor.   Cardiovascular: Negative for chest pain and palpitations.  Gastrointestinal: Positive for abdominal pain, nausea and vomiting. Negative for abdominal distention, constipation and diarrhea.  Genitourinary: Negative for dysuria, flank pain and frequency.  Musculoskeletal: Negative for back pain, neck pain and neck stiffness.  Neurological: Negative for light-headedness, numbness and headaches.  Psychiatric/Behavioral: Negative for agitation.  All other systems reviewed and are negative.    Physical Exam Updated Vital Signs BP (!) 171/105 (BP Location: Left Arm) Comment: 3rd attempt- RN notified   Pulse (!) 102   Temp 98 F (36.7 C) (Oral)   Resp (!) 26   Ht 5\' 6"  (1.676 m)   Wt 59 kg (130 lb)   SpO2 100%   BMI  20.98 kg/m   Physical Exam  Constitutional: She is oriented to person, place, and time. She appears well-developed and well-nourished. No distress.  HENT:  Head: Normocephalic.  Mouth/Throat: Oropharynx is clear and moist.  Eyes: Conjunctivae and EOM are normal. Pupils are equal, round, and reactive to light.  Neck: Normal range of motion.  Cardiovascular: Intact distal pulses.  No murmur heard. Pulmonary/Chest: Effort normal. No respiratory distress. She has no wheezes. She exhibits no tenderness.  Abdominal: Normal appearance and bowel sounds are normal. There is tenderness in the epigastric area and left upper quadrant. There is no rigidity and no CVA tenderness.    Musculoskeletal: She exhibits no tenderness.  Neurological: She is alert and oriented to person, place, and time. No sensory deficit. She exhibits normal muscle tone.  Skin: Capillary refill takes less than 2 seconds. No rash noted. She is not diaphoretic. No erythema.  Psychiatric: She has a normal mood and affect.  Nursing note and vitals reviewed.    ED Treatments / Results  Labs (all labs ordered are listed, but only abnormal results are displayed) Labs Reviewed  COMPREHENSIVE METABOLIC PANEL - Abnormal; Notable for the following components:      Result Value   Potassium 2.9 (*)    Glucose, Bld 106 (*)    BUN <5 (*)    All other components within normal limits  CBC - Abnormal; Notable for the following components:   WBC 3.8 (*)    RBC 3.75 (*)    Hemoglobin 10.8 (*)    HCT 32.1 (*)    All other components within normal limits  URINALYSIS, ROUTINE W REFLEX MICROSCOPIC - Abnormal; Notable for the following components:   Color, Urine STRAW (*)    Ketones, ur 20 (*)    All other components within normal limits  LIPASE, BLOOD  I-STAT BETA HCG BLOOD, ED ([facility], [facility], [facility] ONLY)    EKG  EKG Interpretation None       Radiology Ct Abdomen Pelvis Wo Contrast  Result Date: 08/08/2017 CLINICAL DATA:  Left  upper quadrant pain with abdominal distention. EXAM: CT ABDOMEN AND PELVIS WITHOUT CONTRAST TECHNIQUE: Multidetector CT imaging of the abdomen and pelvis was performed following the  standard protocol without IV contrast. COMPARISON:  07/30/2017 FINDINGS: Lower chest:  Unremarkable. Hepatobiliary: Patient's known caudate lesion not well demonstrated on today's study. There is no evidence for gallstones, gallbladder wall thickening, or pericholecystic fluid. No intrahepatic or extrahepatic biliary dilation. Pancreas: No focal mass lesion. No dilatation of the main duct. No intraparenchymal cyst. No peripancreatic edema. Spleen: No splenomegaly. No focal mass lesion. Adrenals/Urinary Tract: No adrenal nodule or mass. No gross abnormality in either kidney on this noncontrast exam. No hydroureter. The urinary bladder appears normal for the degree of distention. Stomach/Bowel: Stomach is distended with fluid. Duodenum is normally positioned as is the ligament of Treitz. No small bowel wall thickening. No small bowel dilatation. The terminal ileum is normal. The appendix is normal. No gross colonic mass. No colonic wall thickening. No substantial diverticular change. Vascular/Lymphatic: There is abdominal aortic atherosclerosis without aneurysm. There is no gastrohepatic or hepatoduodenal ligament lymphadenopathy. No intraperitoneal or retroperitoneal lymphadenopathy. No pelvic sidewall lymphadenopathy. Reproductive: Ive groin change noted in the uterus. Other: Cystic and solid mass in the cul-de-sac and left adnexal space is similar to prior. Soft tissue nodule anterior left lower quadrant is similar to prior. Musculoskeletal: Bone windows reveal no worrisome lytic or sclerotic osseous lesions. IMPRESSION: 1. No substantial interval change in exam although assessment today limited by lack of intravenous contrast material. 2. Stomach is distended with fluid, similar to prior. 3. Patient's known caudate liver lesion not  well demonstrated on today's exam. 4. Similar appearance large complex cystic and solid mass in the cul-de-sac and left adnexal space. 5. Similar appearance abnormal soft tissue in the anterior left lower quadrant. 6. Fibroid change in the uterus. Electronically Signed   By: Misty Stanley M.D.   On: 08/08/2017 16:02    Procedures Procedures (including critical care time)  Medications Ordered in ED Medications  fentaNYL (SUBLIMAZE) injection 50 mcg (50 mcg Intravenous Given 08/08/17 2053)  potassium chloride 10 mEq in 100 mL IVPB (10 mEq Intravenous New Bag/Given 08/08/17 2357)  ondansetron (ZOFRAN) injection 4 mg (4 mg Intravenous Given 08/08/17 2053)  HYDROmorphone (DILAUDID) injection 1 mg (1 mg Intravenous Given 08/08/17 2125)  sodium chloride 0.9 % bolus 1,000 mL (0 mLs Intravenous Stopped 08/08/17 2359)  HYDROmorphone (DILAUDID) injection 1 mg (1 mg Intravenous Given 08/08/17 2235)  potassium chloride SA (K-DUR,KLOR-CON) CR tablet 40 mEq (40 mEq Oral Given 08/08/17 2357)  HYDROmorphone (DILAUDID) injection 1 mg (1 mg Intravenous Given 08/08/17 2357)     Initial Impression / Assessment and Plan / ED Course  I have reviewed the triage vital signs and the nursing notes.  Pertinent labs & imaging results that were available during my care of the patient were reviewed by me and considered in my medical decision making (see chart for details).     Cheri Ayotte is a 51 y.o. female with a past medical history significant for cholangiocarcinoma with intra-abdominal metastasis, hyperlipidemia, TIAs, and hyperthyroidism who presents with severe abdominal pain.  According to documentation and report, this is the third visit for the patient in the last 24 hours.  Patient was seen last night for severe abdominal pain and then again this afternoon at a different facility.  She had a biopsy of a likely metastasis in her pelvis 3 days ago but her pain began yesterday.  She reports pain in her  epigastrium and left upper quadrant that is greater than 10 out of 10 in severity.  She reports that she has been using all of her home pain  medications including Dilaudid without relief.  Patient is writhing and screaming in pain on my initial evaluation.  Patient is coming by her husband reports she has had no new fevers, chills, cough, congestion, or shortness of breath.  Patient has had nausea and vomiting and has had decreased oral intake since the pain began.  She has had no change in urination or bowel movements.  Patient has been able to have a bowel movement today without significant bleeding, conservation, or diarrhea.  She denied any urinary symptoms.  On exam, patient has tenderness in the epigastrium and left upper quadrant.  Patient is intermittently pacing around the room or doubled over in pain on the bed.  Lungs are clear and chest is nontender.  Husband reports that this is how she has been since yesterday.  According to documentation, patient had improvement after IV pain medications but every time she gets home the pain returns.  Patient has nausea vomiting and cannot tolerate oral intake.  Chart review also shows that patient had a CT scan earlier this morning without significant abnormalities or changes.  Patient will have repeat blood testing and be given fluids and IV pain medicine.  As this is her third visit for pain likely due to her cancer that is uncontrolled at home and she is intolerant of oral intake, patient may require admission for further management and rehydration.  Patient found to have hypokalemia.  I suspect this is related to her decreased oral intake.  Oral potassium supplementation and IV potassium supplementation will be ordered.  Next  Patient continued to require IV pain medications.  I suspect her pain is due to the metastasis.   Due to patient's worsening electrolytes, intolerance of p.o., and continued severe pain with 3 visits to the emergency department  in the last 24 hours, and I do not feel patient is able to go home.  Hospitalist team will be called for admission for further management of her pain and for rehydration with electrolyte monitoring.   Final Clinical Impressions(s) / ED Diagnoses   Final diagnoses:  Left upper quadrant pain  Nausea  Intractable vomiting with nausea, unspecified vomiting type  Hypokalemia    ED Discharge Orders    None      Clinical Impression: 1. Left upper quadrant pain   2. Nausea   3. Intractable vomiting with nausea, unspecified vomiting type   4. Hypokalemia     Disposition: Admit    This note was prepared with assistance of Dragon voice recognition software. Occasional wrong-word or sound-a-like substitutions may have occurred due to the inherent limitations of voice recognition software.      Climmie Cronce, Gwenyth Allegra, MD 08/09/17 1135

## 2017-08-08 NOTE — ED Notes (Signed)
Patient transported to CT 

## 2017-08-08 NOTE — ED Notes (Signed)
Patient ambulatory to restroom with steady gait.

## 2017-08-08 NOTE — ED Notes (Signed)
amb to BR w/o difficulty 

## 2017-08-08 NOTE — ED Triage Notes (Addendum)
Reports "severe abdominal pain".  Seen last night for same.  States "dilaudid is the only thing that helps the pain".  Reports filled RX for oxycodone-patient taking without relief.

## 2017-08-08 NOTE — ED Notes (Signed)
ED Provider at bedside. 

## 2017-08-08 NOTE — ED Triage Notes (Signed)
Pt states she had a biopsy of her ovary done last week and nothing she has been taking at home has helped her pain  Pt was seen at [facility] earlier today for same  Pt is c/o left lower quadrant pain

## 2017-08-08 NOTE — ED Provider Notes (Signed)
[facility] EMERGENCY DEPARTMENT Provider Note   CSN: [id number] Arrival date & time: 08/08/17  1458     History   Chief Complaint Chief Complaint  Patient presents with  . Abdominal Pain    HPI Nancy [name] is a 51 y.o. female.  Patient is a 51 year old female with a history of known cholangio-sarcoma who is currently undergoing treatment and recently 4 days ago had a biopsy due to new pain in the left upper quadrant for the last 4 weeks presenting today with persistent severe pain in her upper abdomen including the left upper quadrant.  Patient was seen yesterday due to severe pain and given IV Dilaudid.  It was thought the pain was related to her running out of her pain medication and unable to get the prescription filled until today.  Patient's labs were stable last night and she was discharged home.  Patient filled her prescription for medications today and did take her Dilaudid, oxycodone and OxyContin but the pain has not subsided.  The pain is still in the left upper quadrant and severe despite the pain medications.  She denies any nausea, vomiting or diarrhea.  She has had no fever.  She has been eating some and it does not seem to make the pain worse.   The history is provided by the patient and the spouse.  Abdominal Pain   This is a recurrent problem. The current episode started yesterday. The problem occurs constantly. The problem has been gradually worsening. The pain is associated with an unknown factor. The pain is located in the epigastric region and LLQ. The quality of the pain is shooting, cramping and throbbing. The pain is at a severity of 10/10. The pain is severe. Pertinent negatives include anorexia, fever, diarrhea, nausea, vomiting, constipation, dysuria and frequency. The symptoms are aggravated by activity and certain positions. Nothing relieves the symptoms. Past medical history comments: cholangiosarcoma.    Past Medical History:  Diagnosis  Date  . Allergy    SEASONAL  . Anemia    [employer]  . Atopic eczema   . cholangio ca dx'd 07/12/16  . Fibrocystic breast disease   . GERD (gastroesophageal reflux disease)   . Hx of migraines    seasonal   . Personal history of chemotherapy   . Pneumonia    6 years ago   . TIA (transient ischemic attack)     Patient Active Problem List   Diagnosis Date Noted  . Hypokalemia   . Acute neoplasm-related pain 07/29/2017  . Abdominal pain, acute, left upper quadrant 07/29/2017  . Dehydration 07/29/2017  . Oral candidiasis 07/19/2017  . Neoplasm related pain 07/19/2017  . Anemia due to antineoplastic chemotherapy 02/13/2017  . Goals of care, counseling/discussion 10/04/2016  . Cerumen impaction 09/16/2016  . Abnormal finding on GI tract imaging   . Metastatic cholangiocarcinoma to lung ([place])   . Acute bronchitis 10/01/2015  . Vestibular migraine 06/20/2015  . TIA (transient ischemic attack) 06/12/2015  . Hyperlipidemia LDL goal <70 06/12/2015  . Diplopia 06/09/2015  . Vertigo 06/09/2015  . Right leg pain 04/12/2014  . Hyperthyroidism 03/13/2014  . MAMMOGRAM, ABNORMAL 10/31/2008  . ECZEMA, ATOPIC 10/15/2008  . FIBROCYSTIC BREAST DISEASE, HX OF 10/15/2008    Past Surgical History:  Procedure Laterality Date  . EUS N/A 07/07/2016   Procedure: UPPER ENDOSCOPIC ULTRASOUND (EUS) RADIAL;  Surgeon: [name], MD;  Location: [facility] ENDOS[facility];  Service: Endoscopy;  Laterality: N/A;  . IR GENERIC HISTORICAL  08/03/2016  IR US GUIDE VASC ACCESS RIGHT 08/03/2016 Corrie Mckusick, DO [facility]  . IR GENERIC HISTORICAL  08/03/2016   IR FLUORO GUIDE PORT INSERTION RIGHT 08/03/2016 Corrie Mckusick, DO [facility]  . MYOMECTOMY  2002    OB History    No data available       Home Medications    Prior to Admission medications   Medication Sig Start Date End Date Taking? Authorizing Provider  ALPRAZolam Duanne Moron) 0.5 MG tablet Take 1 tablet (0.5 mg total) by mouth 3 (three) times  daily as needed for anxiety. 08/01/17   Eugenie Filler, MD  aspirin 81 MG tablet Take 81 mg by mouth daily.    [provider]  cetirizine (ZYRTEC) 10 MG tablet Take 10 mg by mouth daily as needed. For allergies     [provider]  Cholecalciferol (VITAMIN D3) 5000 UNITS CAPS Take 1 capsule by mouth daily.    [provider]  COLOSTRUM PO Take 1.25 g by mouth daily. 1.25 grams twice daily     [provider]  dicyclomine (BENTYL) 20 MG tablet 3 (three) times daily as needed.  07/22/16   [provider]  dronabinol (MARINOL) 5 MG capsule Take 1 capsule (5 mg total) by mouth 2 (two) times daily before a meal. 06/08/17   Owens Shark, NP  HYDROmorphone (DILAUDID) 2 MG tablet Take 1 tablet (2 mg total) by mouth every 4 (four) hours as needed for severe pain. 08/08/17   Orpah Greek, MD  lidocaine-prilocaine (EMLA) cream Apply 1 application topically as needed. Apply to portacath 1 1/2 hours - 2 hours prior to procedures as needed. 07/27/17   Truitt Merle, MD  LORazepam (ATIVAN) 0.5 MG tablet Place under tongue and dissolve every 8 hours as needed for nausea. Do not take Xanax while taking this medication. 05/18/17   Tanner, Lyndon Code., PA-C  magnesium hydroxide (MILK OF MAGNESIA) 400 MG/5ML suspension Take 30 mLs by mouth daily as needed for mild constipation.    [provider]  megestrol (MEGACE) 400 MG/10ML suspension Take 10 mLs (400 mg total) by mouth daily. 08/02/17   Eugenie Filler, MD  Menaquinone-7 (VITAMIN K2) 40 MCG TABS Take by mouth.    [provider]  mometasone (ELOCON) 0.1 % lotion Apply topically daily. 08/21/15   Roma Schanz R, DO  OLANZapine (ZYPREXA) 2.5 MG tablet Take 1 tablet (2.5 mg total) by mouth at bedtime. 07/20/17   Tanner, Lyndon Code., PA-C  omeprazole (PRILOSEC) 40 MG capsule Take 1 capsule (40 mg total) by mouth 2 (two) times daily before a meal. 08/01/17   Eugenie Filler, MD  ondansetron  (ZOFRAN) 8 MG tablet Take 1 tablet (8 mg total) by mouth every 8 (eight) hours as needed for nausea or vomiting. 07/25/17   Alla Feeling, NP  oxyCODONE (OXY IR/ROXICODONE) 5 MG immediate release tablet Take 1-2 tablets (5-10 mg total) by mouth every 4 (four) hours as needed for severe pain. 08/08/17   Orpah Greek, MD  oxyCODONE (OXYCONTIN) 40 mg 12 hr tablet Take 1 tablet (40 mg total) by mouth every 12 (twelve) hours. 08/08/17   Orpah Greek, MD  polyethylene glycol (MIRALAX / GLYCOLAX) packet Take 17 g by mouth 2 (two) times daily. Hold if you develop diarrhea 08/01/17   Eugenie Filler, MD  promethazine (PHENERGAN) 25 MG tablet Take 1 tablet (25 mg total) by mouth every 6 (six) hours as needed for nausea or vomiting.  05/18/17   Tanner, Lyndon Code., PA-C  scopolamine (TRANSDERM-SCOP) 1 MG/3DAYS Place 1 patch (1.5 mg total) every 3 (three) days onto the skin. 07/13/17   Tanner, Lyndon Code., PA-C  senna (SENOKOT) 8.6 MG TABS tablet Take 2 tablets (17.2 mg total) by mouth 2 (two) times daily. 08/01/17   Eugenie Filler, MD  triamcinolone cream (KENALOG) 0.1 % Apply topically.    [provider]  Trolamine Salicylate (ASPERCREME EX) Apply 1 application topically.    [provider]    Family History Family History  Problem Relation Age of Onset  . Gout Mother   . Diabetes Mother   . Hypertension Father   . Diabetes Father   . Breast cancer Paternal Aunt   . Cancer Maternal Grandfather        throat cancer   . Cancer Cousin 60       GI cancer   . Thyroid disease Neg Hx     Social History Social History   Tobacco Use  . Smoking status: Never Smoker  . Smokeless tobacco: Never Used  Substance Use Topics  . Alcohol use: Yes    Comment: maybe 3 times a year  . Drug use: No     Allergies   Penicillins; Contrast media [iodinated diagnostic agents]; and Lipitor [atorvastatin]   Review of Systems Review of Systems  Constitutional: Negative for  fever.  Gastrointestinal: Positive for abdominal pain. Negative for anorexia, constipation, diarrhea, nausea and vomiting.  Genitourinary: Negative for dysuria and frequency.  All other systems reviewed and are negative.    Physical Exam Updated Vital Signs BP (!) 143/93   Pulse (!) 110   Temp 98.8 F (37.1 C) (Oral)   Resp (!) 24   Ht 5\' 6"  (1.676 m)   Wt 59 kg (130 lb)   SpO2 100%   BMI 20.98 kg/m   Physical Exam  Constitutional: She is oriented to person, place, and time. She appears well-developed and well-nourished. She appears distressed.  Rocking in the bed and moaning, clutching her abdomen  HENT:  Head: Normocephalic and atraumatic.  Mouth/Throat: Oropharynx is clear and moist.  Pale mucous membranes, but moist  Eyes: Conjunctivae and EOM are normal. Pupils are equal, round, and reactive to light.  Pale conjunctive a  Neck: Normal range of motion. Neck supple.  Cardiovascular: Regular rhythm and intact distal pulses. Tachycardia present.  No murmur heard. Pulmonary/Chest: Effort normal and breath sounds normal. No respiratory distress. She has no wheezes. She has no rales.  Abdominal: Soft. She exhibits no distension. There is tenderness in the epigastric area and left upper quadrant. There is guarding. There is no rebound.  Musculoskeletal: Normal range of motion. She exhibits no edema or tenderness.  Neurological: She is alert and oriented to person, place, and time.  Skin: Skin is warm and dry. Capillary refill takes 2 to 3 seconds. No rash noted. No erythema. There is pallor.  Psychiatric: She has a normal mood and affect. Her behavior is normal.  Nursing note and vitals reviewed.    ED Treatments / Results  Labs (all labs ordered are listed, but only abnormal results are displayed) Labs Reviewed  CBC WITH DIFFERENTIAL/PLATELET - Abnormal; Notable for the following components:      Result Value   WBC 3.7 (*)    RBC 3.42 (*)    Hemoglobin 9.7 (*)    HCT  29.5 (*)    All other components within normal limits  COMPREHENSIVE METABOLIC PANEL - Abnormal;  Notable for the following components:   Potassium 3.4 (*)    Glucose, Bld 100 (*)    BUN >5 (*)    Calcium 8.8 (*)    Total Protein 6.3 (*)    Alkaline Phosphatase 35 (*)    All other components within normal limits  LIPASE, BLOOD    EKG  EKG Interpretation None       Radiology Ct Abdomen Pelvis Wo Contrast  Result Date: 08/08/2017 CLINICAL DATA:  Left upper quadrant pain with abdominal distention. EXAM: CT ABDOMEN AND PELVIS WITHOUT CONTRAST TECHNIQUE: Multidetector CT imaging of the abdomen and pelvis was performed following the standard protocol without IV contrast. COMPARISON:  07/30/2017 FINDINGS: Lower chest:  Unremarkable. Hepatobiliary: Patient's known caudate lesion not well demonstrated on today's study. There is no evidence for gallstones, gallbladder wall thickening, or pericholecystic fluid. No intrahepatic or extrahepatic biliary dilation. Pancreas: No focal mass lesion. No dilatation of the main duct. No intraparenchymal cyst. No peripancreatic edema. Spleen: No splenomegaly. No focal mass lesion. Adrenals/Urinary Tract: No adrenal nodule or mass. No gross abnormality in either kidney on this noncontrast exam. No hydroureter. The urinary bladder appears normal for the degree of distention. Stomach/Bowel: Stomach is distended with fluid. Duodenum is normally positioned as is the ligament of Treitz. No small bowel wall thickening. No small bowel dilatation. The terminal ileum is normal. The appendix is normal. No gross colonic mass. No colonic wall thickening. No substantial diverticular change. Vascular/Lymphatic: There is abdominal aortic atherosclerosis without aneurysm. There is no gastrohepatic or hepatoduodenal ligament lymphadenopathy. No intraperitoneal or retroperitoneal lymphadenopathy. No pelvic sidewall lymphadenopathy. Reproductive: Ive groin change noted in the uterus.  Other: Cystic and solid mass in the cul-de-sac and left adnexal space is similar to prior. Soft tissue nodule anterior left lower quadrant is similar to prior. Musculoskeletal: Bone windows reveal no worrisome lytic or sclerotic osseous lesions. IMPRESSION: 1. No substantial interval change in exam although assessment today limited by lack of intravenous contrast material. 2. Stomach is distended with fluid, similar to prior. 3. Patient's known caudate liver lesion not well demonstrated on today's exam. 4. Similar appearance large complex cystic and solid mass in the cul-de-sac and left adnexal space. 5. Similar appearance abnormal soft tissue in the anterior left lower quadrant. 6. Fibroid change in the uterus. Electronically Signed   By: Misty Stanley M.D.   On: 08/08/2017 16:02    Procedures Procedures (including critical care time)  Medications Ordered in ED Medications  0.9 %  sodium chloride infusion ( Intravenous New Bag/Given 08/08/17 1537)  HYDROmorphone (DILAUDID) 1 MG/ML injection (not administered)  HYDROmorphone (DILAUDID) injection 1 mg (1 mg Intravenous Given 08/08/17 1537)  HYDROmorphone (DILAUDID) injection 1 mg (1 mg Intravenous Given 08/08/17 1648)  heparin lock flush 100 UNIT/ML injection (500 Units  Given 08/08/17 1711)     Initial Impression / Assessment and Plan / ED Course  I have reviewed the triage vital signs and the nursing notes.  Pertinent labs & imaging results that were available during my care of the patient were reviewed by me and considered in my medical decision making (see chart for details).     Patient with a complicated history of cholangiocarcinoma with possible metastases recently with a biopsy presenting with worsening left upper quadrant pain.  Initially last night when patient was seen it was thought that the pain was related to the fact that she ran out of her pain medication and did not have any pain control.  However patient filled  her  prescriptions today and took all 3 of her medications without control of her pain.  Patient is slightly tachycardic today and appears pale.  Hemoglobin yesterday was 9 however concern for possible intra-abdominal bleeding from recent biopsy versus infection or abscess.  However patient denies any infectious symptoms.  She is not having any nausea or vomiting but could also be pancreatitis.  We will repeat labs to ensure no acute changes.  Will do a CT without contrast due to contrast allergy for further evaluation.  Pt given IV pain control.  CT done 07/30/2017 showed abnormal soft tissue in the left lower quadrant concerning for metastatic disease which is where she had the biopsy.  However that time she was also having left upper quadrant pain and there was nothing to explain the pain she is having in her upper abdomen.  5:09 PM Rubs without significant change.  Hemoglobin remained stable.  CT without acute changes.  Patient's pain is controlled after 2 mg of Dilaudid.  Now that she has her home medications she and her husband want to go home and continue doing oral therapy.  They will follow-up with her oncologist if pain continues to be uncontrolled.    Final Clinical Impressions(s) / ED Diagnoses   Final diagnoses:  Left upper quadrant pain    ED Discharge Orders    None       Blanchie Dessert, MD 08/08/17 1711

## 2017-08-09 ENCOUNTER — Telehealth: Payer: Self-pay | Admitting: *Deleted

## 2017-08-09 ENCOUNTER — Encounter (HOSPITAL_COMMUNITY): Payer: Self-pay | Admitting: Internal Medicine

## 2017-08-09 DIAGNOSIS — N839 Noninflammatory disorder of ovary, fallopian tube and broad ligament, unspecified: Secondary | ICD-10-CM

## 2017-08-09 DIAGNOSIS — C7800 Secondary malignant neoplasm of unspecified lung: Secondary | ICD-10-CM

## 2017-08-09 DIAGNOSIS — R030 Elevated blood-pressure reading, without diagnosis of hypertension: Secondary | ICD-10-CM | POA: Diagnosis present

## 2017-08-09 DIAGNOSIS — C221 Intrahepatic bile duct carcinoma: Secondary | ICD-10-CM

## 2017-08-09 DIAGNOSIS — K5900 Constipation, unspecified: Secondary | ICD-10-CM | POA: Diagnosis present

## 2017-08-09 DIAGNOSIS — R1909 Other intra-abdominal and pelvic swelling, mass and lump: Secondary | ICD-10-CM | POA: Diagnosis not present

## 2017-08-09 DIAGNOSIS — R1110 Vomiting, unspecified: Secondary | ICD-10-CM | POA: Diagnosis not present

## 2017-08-09 DIAGNOSIS — G893 Neoplasm related pain (acute) (chronic): Principal | ICD-10-CM

## 2017-08-09 DIAGNOSIS — Z6820 Body mass index (BMI) 20.0-20.9, adult: Secondary | ICD-10-CM | POA: Diagnosis not present

## 2017-08-09 DIAGNOSIS — E440 Moderate protein-calorie malnutrition: Secondary | ICD-10-CM | POA: Diagnosis not present

## 2017-08-09 DIAGNOSIS — E876 Hypokalemia: Secondary | ICD-10-CM | POA: Diagnosis present

## 2017-08-09 DIAGNOSIS — E43 Unspecified severe protein-calorie malnutrition: Secondary | ICD-10-CM | POA: Diagnosis present

## 2017-08-09 DIAGNOSIS — R112 Nausea with vomiting, unspecified: Secondary | ICD-10-CM | POA: Diagnosis present

## 2017-08-09 DIAGNOSIS — G629 Polyneuropathy, unspecified: Secondary | ICD-10-CM | POA: Diagnosis present

## 2017-08-09 DIAGNOSIS — R109 Unspecified abdominal pain: Secondary | ICD-10-CM | POA: Diagnosis not present

## 2017-08-09 DIAGNOSIS — N83209 Unspecified ovarian cyst, unspecified side: Secondary | ICD-10-CM | POA: Diagnosis present

## 2017-08-09 DIAGNOSIS — E860 Dehydration: Secondary | ICD-10-CM | POA: Diagnosis present

## 2017-08-09 DIAGNOSIS — D72819 Decreased white blood cell count, unspecified: Secondary | ICD-10-CM | POA: Diagnosis present

## 2017-08-09 DIAGNOSIS — D630 Anemia in neoplastic disease: Secondary | ICD-10-CM | POA: Diagnosis present

## 2017-08-09 DIAGNOSIS — F1123 Opioid dependence with withdrawal: Secondary | ICD-10-CM | POA: Diagnosis present

## 2017-08-09 DIAGNOSIS — D638 Anemia in other chronic diseases classified elsewhere: Secondary | ICD-10-CM | POA: Diagnosis present

## 2017-08-09 LAB — HEPATIC FUNCTION PANEL
ALT: 16 U/L (ref 14–54)
AST: 25 U/L (ref 15–41)
Albumin: 3.8 g/dL (ref 3.5–5.0)
Alkaline Phosphatase: 33 U/L — ABNORMAL LOW (ref 38–126)
Bilirubin, Direct: 0.2 mg/dL (ref 0.1–0.5)
Indirect Bilirubin: 0.8 mg/dL (ref 0.3–0.9)
Total Bilirubin: 1 mg/dL (ref 0.3–1.2)
Total Protein: 6.5 g/dL (ref 6.5–8.1)

## 2017-08-09 LAB — CBC
HCT: 31.1 % — ABNORMAL LOW (ref 36.0–46.0)
Hemoglobin: 10.2 g/dL — ABNORMAL LOW (ref 12.0–15.0)
MCH: 27.9 pg (ref 26.0–34.0)
MCHC: 32.8 g/dL (ref 30.0–36.0)
MCV: 85 fL (ref 78.0–100.0)
Platelets: 234 10*3/uL (ref 150–400)
RBC: 3.66 MIL/uL — ABNORMAL LOW (ref 3.87–5.11)
RDW: 14 % (ref 11.5–15.5)
WBC: 3.9 10*3/uL — ABNORMAL LOW (ref 4.0–10.5)

## 2017-08-09 LAB — BASIC METABOLIC PANEL
Anion gap: 10 (ref 5–15)
BUN: <5 mg/dL — ABNORMAL LOW (ref 6–20)
CO2: 23 mmol/L (ref 22–32)
Calcium: 8.8 mg/dL — ABNORMAL LOW (ref 8.9–10.3)
Chloride: 106 mmol/L (ref 101–111)
Creatinine, Ser: 0.43 mg/dL — ABNORMAL LOW (ref 0.44–1.00)
GFR calc Af Amer: >60 mL/min (ref >60)
GFR calc non Af Amer: >60 mL/min (ref >60)
Glucose, Bld: 109 mg/dL — ABNORMAL HIGH (ref 65–99)
Potassium: 3.6 mmol/L (ref 3.5–5.1)
Sodium: 139 mmol/L (ref 135–145)

## 2017-08-09 MED ORDER — LORATADINE 10 MG PO TABS
10.0000 mg | ORAL_TABLET | Freq: Every day | ORAL | Status: DC
  Administered 2017-08-09 – 2017-08-10 (×2): 10 mg via ORAL
  Filled 2017-08-09 (×2): qty 1

## 2017-08-09 MED ORDER — POTASSIUM CHLORIDE IN NACL 20-0.9 MEQ/L-% IV SOLN
INTRAVENOUS | Status: AC
  Administered 2017-08-09 (×2): via INTRAVENOUS
  Filled 2017-08-09 (×3): qty 1000

## 2017-08-09 MED ORDER — LORAZEPAM 0.5 MG PO TABS
0.5000 mg | ORAL_TABLET | Freq: Three times a day (TID) | ORAL | Status: DC | PRN
  Administered 2017-08-09: 0.5 mg via SUBLINGUAL
  Filled 2017-08-09: qty 1

## 2017-08-09 MED ORDER — ACETAMINOPHEN 325 MG PO TABS: 650.0000 mg | ORAL_TABLET | Freq: Four times a day (QID) | ORAL | Status: DC | PRN

## 2017-08-09 MED ORDER — ALPRAZOLAM 0.5 MG PO TABS: 0.5000 mg | ORAL_TABLET | Freq: Three times a day (TID) | ORAL | Status: DC | PRN

## 2017-08-09 MED ORDER — OXYCODONE HCL ER 10 MG PO T12A
40.0000 mg | EXTENDED_RELEASE_TABLET | Freq: Two times a day (BID) | ORAL | Status: DC
  Administered 2017-08-09 (×2): 40 mg via ORAL
  Filled 2017-08-09 (×2): qty 4

## 2017-08-09 MED ORDER — DICYCLOMINE HCL 20 MG PO TABS
20.0000 mg | ORAL_TABLET | Freq: Three times a day (TID) | ORAL | Status: DC | PRN
  Administered 2017-08-09: 20 mg via ORAL
  Filled 2017-08-09 (×2): qty 1

## 2017-08-09 MED ORDER — ONDANSETRON HCL 4 MG/2ML IJ SOLN: 4.0000 mg | Freq: Four times a day (QID) | INTRAMUSCULAR | Status: DC | PRN

## 2017-08-09 MED ORDER — PANTOPRAZOLE SODIUM 40 MG PO TBEC
40.0000 mg | DELAYED_RELEASE_TABLET | Freq: Every day | ORAL | Status: DC
  Administered 2017-08-09 – 2017-08-10 (×2): 40 mg via ORAL
  Filled 2017-08-09 (×2): qty 1

## 2017-08-09 MED ORDER — MEGESTROL ACETATE 400 MG/10ML PO SUSP
400.0000 mg | Freq: Every day | ORAL | Status: DC
  Administered 2017-08-09 – 2017-08-10 (×2): 400 mg via ORAL
  Filled 2017-08-09 (×2): qty 10

## 2017-08-09 MED ORDER — SCOPOLAMINE 1 MG/3DAYS TD PT72: 1.0000 | MEDICATED_PATCH | TRANSDERMAL | Status: DC

## 2017-08-09 MED ORDER — OLANZAPINE 2.5 MG PO TABS
2.5000 mg | ORAL_TABLET | Freq: Every day | ORAL | Status: DC
  Administered 2017-08-09: 2.5 mg via ORAL
  Filled 2017-08-09 (×2): qty 1

## 2017-08-09 MED ORDER — OXYCODONE HCL 5 MG PO TABS
5.0000 mg | ORAL_TABLET | ORAL | Status: DC | PRN
  Administered 2017-08-09 – 2017-08-10 (×7): 10 mg via ORAL
  Filled 2017-08-09 (×7): qty 2

## 2017-08-09 MED ORDER — SENNA 8.6 MG PO TABS
2.0000 | ORAL_TABLET | Freq: Two times a day (BID) | ORAL | Status: DC
  Administered 2017-08-09 – 2017-08-10 (×3): 17.2 mg via ORAL
  Filled 2017-08-09 (×3): qty 2

## 2017-08-09 MED ORDER — HYDROMORPHONE HCL 1 MG/ML IJ SOLN
1.0000 mg | INTRAMUSCULAR | Status: DC | PRN
  Administered 2017-08-09 – 2017-08-10 (×10): 1 mg via INTRAVENOUS
  Filled 2017-08-09 (×10): qty 1

## 2017-08-09 MED ORDER — ACETAMINOPHEN 650 MG RE SUPP
650.0000 mg | Freq: Four times a day (QID) | RECTAL | Status: DC | PRN
Start: 2017-08-09 — End: 2017-08-10

## 2017-08-09 MED ORDER — DRONABINOL 2.5 MG PO CAPS
5.0000 mg | ORAL_CAPSULE | Freq: Two times a day (BID) | ORAL | Status: DC
  Administered 2017-08-09 – 2017-08-10 (×4): 5 mg via ORAL
  Filled 2017-08-09 (×3): qty 2
  Filled 2017-08-09: qty 1

## 2017-08-09 MED ORDER — HYDROMORPHONE HCL 1 MG/ML IJ SOLN
1.0000 mg | Freq: Once | INTRAMUSCULAR | Status: AC
  Administered 2017-08-09: 1 mg via INTRAVENOUS
  Filled 2017-08-09: qty 1

## 2017-08-09 MED ORDER — ASPIRIN 81 MG PO CHEW
81.0000 mg | CHEWABLE_TABLET | Freq: Every day | ORAL | Status: DC
  Administered 2017-08-09 – 2017-08-10 (×2): 81 mg via ORAL
  Filled 2017-08-09 (×2): qty 1

## 2017-08-09 MED ORDER — POLYETHYLENE GLYCOL 3350 17 G PO PACK: 17.0000 g | PACK | Freq: Every day | ORAL | Status: DC | PRN

## 2017-08-09 MED ORDER — OXYCODONE HCL ER 40 MG PO T12A
60.0000 mg | EXTENDED_RELEASE_TABLET | Freq: Two times a day (BID) | ORAL | Status: DC
  Administered 2017-08-09 – 2017-08-10 (×2): 60 mg via ORAL
  Filled 2017-08-09 (×2): qty 1

## 2017-08-09 MED ORDER — ENOXAPARIN SODIUM 40 MG/0.4ML ~~LOC~~ SOLN
40.0000 mg | SUBCUTANEOUS | Status: DC
  Filled 2017-08-09 (×2): qty 0.4

## 2017-08-09 MED ORDER — ONDANSETRON HCL 4 MG PO TABS
4.0000 mg | ORAL_TABLET | Freq: Four times a day (QID) | ORAL | Status: DC | PRN
Start: 2017-08-09 — End: 2017-08-10

## 2017-08-09 NOTE — Telephone Encounter (Signed)
Patient's spouse Octavia Bruckner called requesting refill for pain medications.  Return number when ready for pick up is [phone].

## 2017-08-09 NOTE — ED Notes (Signed)
ED TO INPATIENT HANDOFF REPORT  Name/Age/Gender Nancy [name] 51 y.o. female  Code Status    Code Status Orders  (From admission, onward)        Start     Ordered   08/09/17 0130  Full code  Continuous     08/09/17 0131    Code Status History    Date Active Date Inactive Code Status Order ID Comments User Context   07/29/2017 19:05 08/01/2017 18:55 Full Code [id number]  Eugenie Filler, MD Inpatient   06/09/2015 23:22 06/10/2015 18:29 Full Code [id number]  Rise Patience, MD ED    Advance Directive Documentation     Most Recent Value  Type of Advance Directive  Healthcare Power of Attorney, Living will  Pre-existing out of facility DNR order (yellow form or pink MOST form)  No data  "MOST" Form in Place?  No data      Home/SNF/Other Home  Chief Complaint vomiting ( chemo Card )   Level of Care/Admitting Diagnosis ED Disposition    ED Disposition Condition Comment   Admit  Hospital Area: [facility] [[id number]]  Level of Care: Telemetry [5]  Admit to tele based on following criteria: Monitor QTC interval  Diagnosis: Nausea & vomiting [272536]  Admitting Physician: Rise Patience [phone]  Attending Physician: Rise Patience [email or web address]  PT Class (Do Not Modify): Observation [104]  PT Acc Code (Do Not Modify): Observation [10022]       Medical History Past Medical History:  Diagnosis Date  . Allergy    SEASONAL  . Anemia    [employer]  . Atopic eczema   . cholangio ca dx'd 07/12/16  . Fibrocystic breast disease   . GERD (gastroesophageal reflux disease)   . Hx of migraines    seasonal   . Personal history of chemotherapy   . Pneumonia    6 years ago   . TIA (transient ischemic attack)     Allergies Allergies  Allergen Reactions  . Penicillins Anaphylaxis    Has patient had a PCN reaction causing immediate rash, facial/tongue/throat swelling, SOB or lightheadedness with hypotensionYes Swelling in throat  Has  patient had a PCN reaction causing severe rash involving mucus membranes or skin necrosis:/No Has patient had a PCN reaction that required hospitalization/No Has patient had a PCN reaction occurring within the last 10 years:NO If all of the above answers are "NO", then may proceed with Cephalosporin use.   . Contrast Media [Iodinated Diagnostic Agents] Other (See Comments)    Tingling under left rib area   . Lipitor [Atorvastatin] Palpitations    Tingling, flushing    IV Location/Drains/Wounds Patient Lines/Drains/Airways Status   Active Line/Drains/Airways    Name:   Placement date:   Placement time:   Site:   Days:   Implanted Port 08/03/16 Right Chest   08/03/16    1527    Chest   371   Peripheral IV 08/08/17 Right Antecubital   08/08/17    2054    Antecubital   1          Labs/Imaging Results for orders placed or performed during the hospital encounter of 08/08/17 (from the past 48 hour(s))  Lipase, blood     Status: None   Collection Time: 08/08/17  8:51 PM  Result Value Ref Range   Lipase 21 11 - 51 U/L  Comprehensive metabolic panel     Status: Abnormal   Collection Time: 08/08/17  8:51 PM  Result Value  Ref Range   Sodium 139 135 - 145 mmol/L   Potassium 2.9 (L) 3.5 - 5.1 mmol/L   Chloride 104 101 - 111 mmol/L   CO2 26 22 - 32 mmol/L   Glucose, Bld 106 (H) 65 - 99 mg/dL   BUN <5 (L) 6 - 20 mg/dL   Creatinine, Ser 0.57 0.44 - 1.00 mg/dL   Calcium 9.0 8.9 - 10.3 mg/dL   Total Protein 7.1 6.5 - 8.1 g/dL   Albumin 4.3 3.5 - 5.0 g/dL   AST 32 15 - 41 U/L   ALT 18 14 - 54 U/L   Alkaline Phosphatase 40 38 - 126 U/L   Total Bilirubin 0.5 0.3 - 1.2 mg/dL   GFR calc non Af Amer >60 >60 mL/min   GFR calc Af Amer >60 >60 mL/min    Comment: (NOTE) The eGFR has been calculated using the CKD EPI equation. This calculation has not been validated in all clinical situations. eGFR's persistently <60 mL/min signify possible Chronic Kidney Disease.    Anion gap 9 5 - 15  CBC      Status: Abnormal   Collection Time: 08/08/17  8:51 PM  Result Value Ref Range   WBC 3.8 (L) 4.0 - 10.5 K/uL   RBC 3.75 (L) 3.87 - 5.11 MIL/uL   Hemoglobin 10.8 (L) 12.0 - 15.0 g/dL   HCT 32.1 (L) 36.0 - 46.0 %   MCV 85.6 78.0 - 100.0 fL   MCH 28.8 26.0 - 34.0 pg   MCHC 33.6 30.0 - 36.0 g/dL   RDW 14.1 11.5 - 15.5 %   Platelets 225 150 - 400 K/uL  Urinalysis, Routine w reflex microscopic     Status: Abnormal   Collection Time: 08/08/17  8:56 PM  Result Value Ref Range   Color, Urine STRAW (A) YELLOW   APPearance CLEAR CLEAR   Specific Gravity, Urine 1.009 1.005 - 1.030   pH 8.0 5.0 - 8.0   Glucose, UA NEGATIVE NEGATIVE mg/dL   Hgb urine dipstick NEGATIVE NEGATIVE   Bilirubin Urine NEGATIVE NEGATIVE   Ketones, ur 20 (A) NEGATIVE mg/dL   Protein, ur NEGATIVE NEGATIVE mg/dL   Nitrite NEGATIVE NEGATIVE   Leukocytes, UA NEGATIVE NEGATIVE  I-Stat beta hCG blood, ED     Status: None   Collection Time: 08/08/17  9:04 PM  Result Value Ref Range   I-stat hCG, quantitative <5.0 <5 mIU/mL   Comment 3            Comment:   GEST. AGE      CONC.  (mIU/mL)   <=1 WEEK        5 - 50     2 WEEKS       50 - 500     3 WEEKS       100 - 10,000     4 WEEKS     1,000 - 30,000        FEMALE AND NON-PREGNANT FEMALE:     LESS THAN 5 mIU/mL   Basic metabolic panel     Status: Abnormal   Collection Time: 08/09/17  5:33 AM  Result Value Ref Range   Sodium 139 135 - 145 mmol/L   Potassium 3.6 3.5 - 5.1 mmol/L    Comment: DELTA CHECK NOTED   Chloride 106 101 - 111 mmol/L   CO2 23 22 - 32 mmol/L   Glucose, Bld 109 (H) 65 - 99 mg/dL   BUN <5 (L) 6 - 20 mg/dL  Creatinine, Ser 0.43 (L) 0.44 - 1.00 mg/dL   Calcium 8.8 (L) 8.9 - 10.3 mg/dL   GFR calc non Af Amer >60 >60 mL/min   GFR calc Af Amer >60 >60 mL/min    Comment: (NOTE) The eGFR has been calculated using the CKD EPI equation. This calculation has not been validated in all clinical situations. eGFR's persistently <60 mL/min signify  possible Chronic Kidney Disease.    Anion gap 10 5 - 15  CBC     Status: Abnormal   Collection Time: 08/09/17  5:33 AM  Result Value Ref Range   WBC 3.9 (L) 4.0 - 10.5 K/uL   RBC 3.66 (L) 3.87 - 5.11 MIL/uL   Hemoglobin 10.2 (L) 12.0 - 15.0 g/dL   HCT 31.1 (L) 36.0 - 46.0 %   MCV 85.0 78.0 - 100.0 fL   MCH 27.9 26.0 - 34.0 pg   MCHC 32.8 30.0 - 36.0 g/dL   RDW 14.0 11.5 - 15.5 %   Platelets 234 150 - 400 K/uL  Hepatic function panel     Status: Abnormal   Collection Time: 08/09/17  5:33 AM  Result Value Ref Range   Total Protein 6.5 6.5 - 8.1 g/dL   Albumin 3.8 3.5 - 5.0 g/dL   AST 25 15 - 41 U/L   ALT 16 14 - 54 U/L   Alkaline Phosphatase 33 (L) 38 - 126 U/L   Total Bilirubin 1.0 0.3 - 1.2 mg/dL   Bilirubin, Direct 0.2 0.1 - 0.5 mg/dL   Indirect Bilirubin 0.8 0.3 - 0.9 mg/dL   Ct Abdomen Pelvis Wo Contrast  Result Date: 08/08/2017 CLINICAL DATA:  Left upper quadrant pain with abdominal distention. EXAM: CT ABDOMEN AND PELVIS WITHOUT CONTRAST TECHNIQUE: Multidetector CT imaging of the abdomen and pelvis was performed following the standard protocol without IV contrast. COMPARISON:  07/30/2017 FINDINGS: Lower chest:  Unremarkable. Hepatobiliary: Patient's known caudate lesion not well demonstrated on today's study. There is no evidence for gallstones, gallbladder wall thickening, or pericholecystic fluid. No intrahepatic or extrahepatic biliary dilation. Pancreas: No focal mass lesion. No dilatation of the main duct. No intraparenchymal cyst. No peripancreatic edema. Spleen: No splenomegaly. No focal mass lesion. Adrenals/Urinary Tract: No adrenal nodule or mass. No gross abnormality in either kidney on this noncontrast exam. No hydroureter. The urinary bladder appears normal for the degree of distention. Stomach/Bowel: Stomach is distended with fluid. Duodenum is normally positioned as is the ligament of Treitz. No small bowel wall thickening. No small bowel dilatation. The terminal  ileum is normal. The appendix is normal. No gross colonic mass. No colonic wall thickening. No substantial diverticular change. Vascular/Lymphatic: There is abdominal aortic atherosclerosis without aneurysm. There is no gastrohepatic or hepatoduodenal ligament lymphadenopathy. No intraperitoneal or retroperitoneal lymphadenopathy. No pelvic sidewall lymphadenopathy. Reproductive: Ive groin change noted in the uterus. Other: Cystic and solid mass in the cul-de-sac and left adnexal space is similar to prior. Soft tissue nodule anterior left lower quadrant is similar to prior. Musculoskeletal: Bone windows reveal no worrisome lytic or sclerotic osseous lesions. IMPRESSION: 1. No substantial interval change in exam although assessment today limited by lack of intravenous contrast material. 2. Stomach is distended with fluid, similar to prior. 3. Patient's known caudate liver lesion not well demonstrated on today's exam. 4. Similar appearance large complex cystic and solid mass in the cul-de-sac and left adnexal space. 5. Similar appearance abnormal soft tissue in the anterior left lower quadrant. 6. Fibroid change in the uterus. Electronically Signed  By: Misty Stanley M.D.   On: 08/08/2017 16:02    Pending Labs Unresulted Labs (From admission, onward)   Start     Ordered   08/16/17 0500  Creatinine, serum  (enoxaparin (LOVENOX)    CrCl >/= 30 ml/min)  Weekly,   R    Comments:  while on enoxaparin therapy    08/09/17 0131   08/10/17 0500  CBC with Differential/Platelet  Tomorrow morning,   R     08/09/17 1150   08/10/17 0500  Comprehensive metabolic panel  Tomorrow morning,   R     08/09/17 1150   08/10/17 0500  Magnesium  Tomorrow morning,   R     08/09/17 1150   08/09/17 0130  CBC  (enoxaparin (LOVENOX)    CrCl >/= 30 ml/min)  Once,   R    Comments:  Baseline for enoxaparin therapy IF NOT ALREADY DRAWN.  Notify MD if PLT < 100 K.    08/09/17 0131   08/09/17 0130  Creatinine, serum  (enoxaparin  (LOVENOX)    CrCl >/= 30 ml/min)  Once,   R    Comments:  Baseline for enoxaparin therapy IF NOT ALREADY DRAWN.    08/09/17 0131      Vitals/Pain Today's Vitals   08/09/17 0740 08/09/17 0937 08/09/17 1159 08/09/17 1231  BP: (!) 170/106   132/82  Pulse: (!) 104   97  Resp: 18   16  Temp:      TempSrc:      SpO2: 100%   99%  Weight:      Height:      PainSc: 3  5  Asleep     Isolation Precautions No active isolations  Medications Medications  ALPRAZolam (XANAX) tablet 0.5 mg (not administered)  aspirin chewable tablet 81 mg (81 mg Oral Given 08/09/17 0929)  loratadine (CLARITIN) tablet 10 mg (10 mg Oral Given 08/09/17 0930)  dicyclomine (BENTYL) tablet 20 mg (20 mg Oral Given 08/09/17 0511)  dronabinol (MARINOL) capsule 5 mg (5 mg Oral Given 08/09/17 0929)  LORazepam (ATIVAN) tablet 0.5 mg (0.5 mg Sublingual Given 08/09/17 0425)  megestrol (MEGACE) 400 MG/10ML suspension 400 mg (400 mg Oral Given 08/09/17 0927)  OLANZapine (ZYPREXA) tablet 2.5 mg (2.5 mg Oral Not Given 08/09/17 1023)  pantoprazole (PROTONIX) EC tablet 40 mg (40 mg Oral Given 08/09/17 0930)  oxyCODONE (Oxy IR/ROXICODONE) immediate release tablet 5-10 mg (10 mg Oral Given 08/09/17 0910)  scopolamine (TRANSDERM-SCOP) 1 MG/3DAYS 1.5 mg (not administered)  senna (SENOKOT) tablet 17.2 mg (17.2 mg Oral Not Given 08/09/17 1022)  acetaminophen (TYLENOL) tablet 650 mg (not administered)    Or  acetaminophen (TYLENOL) suppository 650 mg (not administered)  ondansetron (ZOFRAN) tablet 4 mg (not administered)    Or  ondansetron (ZOFRAN) injection 4 mg (not administered)  enoxaparin (LOVENOX) injection 40 mg (40 mg Subcutaneous Refused 08/09/17 0931)  0.9 % NaCl with KCl 20 mEq/ L  infusion ( Intravenous Transfusing/Transfer 08/09/17 1233)  HYDROmorphone (DILAUDID) injection 1 mg (1 mg Intravenous Given 08/09/17 0956)  oxyCODONE (OXYCONTIN) 12 hr tablet 60 mg (not administered)  ondansetron (ZOFRAN) injection 4 mg (4  mg Intravenous Given 08/08/17 2053)  HYDROmorphone (DILAUDID) injection 1 mg (1 mg Intravenous Given 08/08/17 2125)  sodium chloride 0.9 % bolus 1,000 mL (0 mLs Intravenous Stopped 08/08/17 2359)  HYDROmorphone (DILAUDID) injection 1 mg (1 mg Intravenous Given 08/08/17 2235)  potassium chloride SA (K-DUR,KLOR-CON) CR tablet 40 mEq (40 mEq Oral Given 08/08/17 2357)  potassium  chloride 10 mEq in 100 mL IVPB (0 mEq Intravenous Stopped 08/09/17 0158)  HYDROmorphone (DILAUDID) injection 1 mg (1 mg Intravenous Given 08/08/17 2357)  HYDROmorphone (DILAUDID) injection 1 mg (1 mg Intravenous Given 08/09/17 0058)    Mobility walks

## 2017-08-09 NOTE — Progress Notes (Signed)
Patient ID: Nancy [name], female   DOB: 1966-08-27, 51 y.o.   MRN: [id number] Patient was admitted early this morning for worsening abdominal pain as she apparently missed her pain medications for the last 2 days.  I have reviewed them medical records and H&P myself.  Patient seen and examined at bedside and discussed the plan of care.  Continue pain management.  Spoke to Dr. Feng/oncology and discussed the plan of care.  Repeat labs in a.m.  Probable discharge in the next 1-2 days once pain is better controlled.

## 2017-08-09 NOTE — Progress Notes (Signed)
Nancy [name]   DOB:[birth year]   HO#:[id number]   BBC#:[id number]  Oncology follow-up   Subjective: Nancy [name] is well known to me, under my care for her metastatic cholangiocarcinoma.  She was recently hospitalized for pain control.  She ran out of her pain medication for 2 days, went to a local urgent care, got reviewed 2 days ago.  However her pain was getting worse yesterday, and came to the [facility] emergency room.  She is being admitted to [facility] today.  Objective:  Vitals:   08/09/17 0430 08/09/17 0740  BP: (!) 157/89 (!) 170/106  Pulse: 100 (!) 104  Resp: 19 18  Temp:    SpO2: 100% 100%    Body mass index is 20.98 kg/m.  Intake/Output Summary (Last 24 hours) at 08/09/2017 1013 Last data filed at 08/09/2017 0158 Gross per 24 hour  Intake 1200 ml  Output -  Net 1200 ml     Sclerae unicteric  Oropharynx clear  No peripheral adenopathy  Lungs clear -- no rales or rhonchi  Heart regular rate and rhythm  Abdomen soft, diffuse tenderness, especially at left side   MSK no focal spinal tenderness, no peripheral edema  Neuro nonfocal   CBG (last 3)  No results for input(s): GLUCAP in the last 72 hours.   Labs:  Lab Results  Component Value Date   WBC 3.9 (L) 08/09/2017   HGB 10.2 (L) 08/09/2017   HCT 31.1 (L) 08/09/2017   MCV 85.0 08/09/2017   PLT 234 08/09/2017   NEUTROABS 2.4 08/08/2017   CMP Latest Ref Rng & Units 08/09/2017 08/08/2017 08/08/2017  Glucose 65 - 99 mg/dL 109(H) 106(H) 100(H)  BUN 6 - 20 mg/dL <5(L) <5(L) >5(L)  Creatinine 0.44 - 1.00 mg/dL 0.43(L) 0.57 0.55  Sodium 135 - 145 mmol/L 139 139 139  Potassium 3.5 - 5.1 mmol/L 3.6 2.9(L) 3.4(L)  Chloride 101 - 111 mmol/L 106 104 106  CO2 22 - 32 mmol/L 23 26 25   Calcium 8.9 - 10.3 mg/dL 8.8(L) 9.0 8.8(L)  Total Protein 6.5 - 8.1 g/dL 6.5 7.1 6.3(L)  Total Bilirubin 0.3 - 1.2 mg/dL 1.0 0.5 0.3  Alkaline Phos 38 - 126 U/L 33(L) 40 35(L)  AST 15 - 41 U/L 25 32 24  ALT 14 - 54 U/L 16 18 16       Urine Studies No results for input(s): UHGB, CRYS in the last 72 hours.  Invalid input(s): UACOL, UAPR, USPG, UPH, UTP, UGL, UKET, UBIL, UNIT, UROB, ULEU, UEPI, UWBC, URBC, Lance Bosch, [place]  Basic Metabolic Panel: Recent Labs  Lab 08/05/17 0729 08/07/17 2334 08/08/17 1532 08/08/17 2051 08/09/17 0533  NA 142 141 139 139 139  K 3.6 3.5 3.4* 2.9* 3.6  CL 107 109 106 104 106  CO2 30 26 25 26 23   GLUCOSE 127* 91 100* 106* 109*  BUN 8 6 >5* <5* <5*  CREATININE 0.68 0.53 0.55 0.57 0.43*  CALCIUM 9.0 8.6* 8.8* 9.0 8.8*   GFR Estimated Creatinine Clearance: 78.4 mL/min (A) (by C-G formula based on SCr of 0.43 mg/dL (L)). Liver Function Tests: Recent Labs  Lab 08/05/17 0729 08/07/17 2334 08/08/17 1532 08/08/17 2051 08/09/17 0533  AST 21 22 24  32 25  ALT 14 13* 16 18 16   ALKPHOS 30* 30* 35* 40 33*  BILITOT 0.2* 0.4 0.3 0.5 1.0  PROT 5.9* 5.9* 6.3* 7.1 6.5  ALBUMIN 3.5 3.4* 3.7 4.3 3.8   Recent Labs  Lab 08/07/17 2334 08/08/17 1532 08/08/17 2051  LIPASE 23 20 21    No results for input(s): AMMONIA in the last 168 hours. Coagulation profile Recent Labs  Lab 08/05/17 0729  INR 0.97    CBC: Recent Labs  Lab 08/05/17 0729 08/07/17 2334 08/08/17 1532 08/08/17 2051 08/09/17 0533  WBC 3.7* 3.6* 3.7* 3.8* 3.9*  NEUTROABS 1.5* 1.4* 2.4  --   --   HGB 9.4* 9.0* 9.7* 10.8* 10.2*  HCT 29.2* 28.1* 29.5* 32.1* 31.1*  MCV 88.2 88.1 86.3 85.6 85.0  PLT 156 185 205 225 234   Cardiac Enzymes: No results for input(s): CKTOTAL, CKMB, CKMBINDEX, TROPONINI in the last 168 hours. BNP: Invalid input(s): POCBNP CBG: No results for input(s): GLUCAP in the last 168 hours. D-Dimer No results for input(s): DDIMER in the last 72 hours. Hgb A1c No results for input(s): HGBA1C in the last 72 hours. Lipid Profile No results for input(s): CHOL, HDL, LDLCALC, TRIG, CHOLHDL, LDLDIRECT in the last 72 hours. Thyroid function studies No results for input(s): TSH, T4TOTAL,  T3FREE, THYROIDAB in the last 72 hours.  Invalid input(s): FREET3 Anemia work up No results for input(s): VITAMINB12, FOLATE, FERRITIN, TIBC, IRON, RETICCTPCT in the last 72 hours. Microbiology No results found for this or any previous visit (from the past 240 hour(s)).    Studies:  Ct Abdomen Pelvis Wo Contrast  Result Date: 08/08/2017 CLINICAL DATA:  Left upper quadrant pain with abdominal distention. EXAM: CT ABDOMEN AND PELVIS WITHOUT CONTRAST TECHNIQUE: Multidetector CT imaging of the abdomen and pelvis was performed following the standard protocol without IV contrast. COMPARISON:  07/30/2017 FINDINGS: Lower chest:  Unremarkable. Hepatobiliary: Patient's known caudate lesion not well demonstrated on today's study. There is no evidence for gallstones, gallbladder wall thickening, or pericholecystic fluid. No intrahepatic or extrahepatic biliary dilation. Pancreas: No focal mass lesion. No dilatation of the main duct. No intraparenchymal cyst. No peripancreatic edema. Spleen: No splenomegaly. No focal mass lesion. Adrenals/Urinary Tract: No adrenal nodule or mass. No gross abnormality in either kidney on this noncontrast exam. No hydroureter. The urinary bladder appears normal for the degree of distention. Stomach/Bowel: Stomach is distended with fluid. Duodenum is normally positioned as is the ligament of Treitz. No small bowel wall thickening. No small bowel dilatation. The terminal ileum is normal. The appendix is normal. No gross colonic mass. No colonic wall thickening. No substantial diverticular change. Vascular/Lymphatic: There is abdominal aortic atherosclerosis without aneurysm. There is no gastrohepatic or hepatoduodenal ligament lymphadenopathy. No intraperitoneal or retroperitoneal lymphadenopathy. No pelvic sidewall lymphadenopathy. Reproductive: Ive groin change noted in the uterus. Other: Cystic and solid mass in the cul-de-sac and left adnexal space is similar to prior. Soft tissue  nodule anterior left lower quadrant is similar to prior. Musculoskeletal: Bone windows reveal no worrisome lytic or sclerotic osseous lesions. IMPRESSION: 1. No substantial interval change in exam although assessment today limited by lack of intravenous contrast material. 2. Stomach is distended with fluid, similar to prior. 3. Patient's known caudate liver lesion not well demonstrated on today's exam. 4. Similar appearance large complex cystic and solid mass in the cul-de-sac and left adnexal space. 5. Similar appearance abnormal soft tissue in the anterior left lower quadrant. 6. Fibroid change in the uterus. Electronically Signed   By: Misty Stanley M.D.   On: 08/08/2017 16:02    Assessment: 51 y.o.African-American female, without significant past medical history, presented with abdominal pain  1. Metastatic intrahepatic cholangiocarcinoma, with probable node and lung metastasis, [id number], stage IV 2.  Intractable abdominal pain, secondary to cancer progression  3. Nausea and anorexia and weight loss  4. Leukopenia and anemia 5. Constipation 6. Peripheral neuropathy, grade 1 8.  Severe protein and calorie malnutrition    Plan:  -Her uncontrolled pain was probably related to her lack of pain medication for a few days, she is now back to Dilaudid, oxycodone and OxyContin.  I recommend her to increase OxyContin from 40 mg to 60 mg every 12 hours -I discussed her recent biopsy of omental nodule, which showed adenocarcinoma, likely from her cholangiocarcinoma.  We discussed next line treatment -I will see if GYN can see her in the hospital, she is scheduled to see them in the clinic this Friday for her large ovarian cystic lesion -continue supportive care  -I will f/u. I appreciate the excellent care from the hospitalist team  Truitt Merle, MD 08/09/2017

## 2017-08-09 NOTE — H&P (Signed)
History and Physical    Nancy [name] OZH:[id number] DOB: 19-Apr-1966 DOA: 08/08/2017  PCP: [name], DO  Patient coming from: Home.  Chief Complaint: Abdominal pain.  HPI: Nancy [name] is a 51 y.o. female with history of cholangiocarcinoma presents to the ER because of worsening abdominal pain.  Patient was just recently discharged last week for similar complaints.  At that time patient's pain medication dose was increased.  Patient had come to the ER yesterday and had CT abdomen and pelvis done which did not show anything acute.  Patient has missed her pain medications for last 2 days as she ran out of it.  Patient got a refill but before she can take the refill medications patient's pain again got worse and had nausea vomiting.  And presented to the ER.  ED Course: In the ER patient had continued severe abdominal pain which was getting relieved with IV pain medication and has been admitted for further observation.  Denies any chest pain or shortness of breath.  Denies any fever chills.  Patient was given IV Dilaudid.  Patient also was getting IV potassium replacement.  Review of Systems: As per HPI, rest all negative.   Past Medical History:  Diagnosis Date  . Allergy    SEASONAL  . Anemia    [employer]  . Atopic eczema   . cholangio ca dx'd 07/12/16  . Fibrocystic breast disease   . GERD (gastroesophageal reflux disease)   . Hx of migraines    seasonal   . Personal history of chemotherapy   . Pneumonia    6 years ago   . TIA (transient ischemic attack)     Past Surgical History:  Procedure Laterality Date  . EUS N/A 07/07/2016   Procedure: UPPER ENDOSCOPIC ULTRASOUND (EUS) RADIAL;  Surgeon: [name], MD;  Location: [facility];  Service: Endoscopy;  Laterality: N/A;  . IR GENERIC HISTORICAL  08/03/2016   IR US GUIDE VASC ACCESS RIGHT 08/03/2016 Corrie Mckusick, DO [facility]  . IR GENERIC HISTORICAL  08/03/2016   IR FLUORO GUIDE PORT INSERTION  RIGHT 08/03/2016 Corrie Mckusick, DO [facility]  . MYOMECTOMY  2002     reports that  has never smoked. she has never used smokeless tobacco. She reports that she drinks alcohol. She reports that she does not use drugs.  Allergies  Allergen Reactions  . Penicillins Anaphylaxis    Has patient had a PCN reaction causing immediate rash, facial/tongue/throat swelling, SOB or lightheadedness with hypotensionYes Swelling in throat  Has patient had a PCN reaction causing severe rash involving mucus membranes or skin necrosis:/No Has patient had a PCN reaction that required hospitalization/No Has patient had a PCN reaction occurring within the last 10 years:NO If all of the above answers are "NO", then may proceed with Cephalosporin use.   . Contrast Media [Iodinated Diagnostic Agents] Other (See Comments)    Tingling under left rib area   . Lipitor [Atorvastatin] Palpitations    Tingling, flushing    Family History  Problem Relation Age of Onset  . Gout Mother   . Diabetes Mother   . Hypertension Father   . Diabetes Father   . Breast cancer Paternal Aunt   . Cancer Maternal Grandfather        throat cancer   . Cancer Cousin 17       GI cancer   . Thyroid disease Neg Hx     Prior to Admission medications   Medication Sig  Start Date End Date Taking? Authorizing Provider  ALPRAZolam Duanne Moron) 0.5 MG tablet Take 1 tablet (0.5 mg total) by mouth 3 (three) times daily as needed for anxiety. 08/01/17   Eugenie Filler, MD  aspirin 81 MG tablet Take 81 mg by mouth daily.    [provider]  cetirizine (ZYRTEC) 10 MG tablet Take 10 mg by mouth daily as needed. For allergies     [provider]  Cholecalciferol (VITAMIN D3) 5000 UNITS CAPS Take 1 capsule by mouth daily.    [provider]  COLOSTRUM PO Take 1.25 g by mouth daily. 1.25 grams twice daily     [provider]  dicyclomine (BENTYL) 20 MG tablet 3 (three) times daily as needed.  07/22/16    [provider]  dronabinol (MARINOL) 5 MG capsule Take 1 capsule (5 mg total) by mouth 2 (two) times daily before a meal. 06/08/17   Owens Shark, NP  HYDROmorphone (DILAUDID) 2 MG tablet Take 1 tablet (2 mg total) by mouth every 4 (four) hours as needed for severe pain. 08/08/17   Orpah Greek, MD  lidocaine-prilocaine (EMLA) cream Apply 1 application topically as needed. Apply to portacath 1 1/2 hours - 2 hours prior to procedures as needed. 07/27/17   Truitt Merle, MD  LORazepam (ATIVAN) 0.5 MG tablet Place under tongue and dissolve every 8 hours as needed for nausea. Do not take Xanax while taking this medication. 05/18/17   Tanner, Lyndon Code., PA-C  magnesium hydroxide (MILK OF MAGNESIA) 400 MG/5ML suspension Take 30 mLs by mouth daily as needed for mild constipation.    [provider]  megestrol (MEGACE) 400 MG/10ML suspension Take 10 mLs (400 mg total) by mouth daily. 08/02/17   Eugenie Filler, MD  Menaquinone-7 (VITAMIN K2) 40 MCG TABS Take by mouth.    [provider]  mometasone (ELOCON) 0.1 % lotion Apply topically daily. 08/21/15   Roma Schanz R, DO  OLANZapine (ZYPREXA) 2.5 MG tablet Take 1 tablet (2.5 mg total) by mouth at bedtime. 07/20/17   Tanner, Lyndon Code., PA-C  omeprazole (PRILOSEC) 40 MG capsule Take 1 capsule (40 mg total) by mouth 2 (two) times daily before a meal. 08/01/17   Eugenie Filler, MD  ondansetron (ZOFRAN) 8 MG tablet Take 1 tablet (8 mg total) by mouth every 8 (eight) hours as needed for nausea or vomiting. 07/25/17   Alla Feeling, NP  oxyCODONE (OXY IR/ROXICODONE) 5 MG immediate release tablet Take 1-2 tablets (5-10 mg total) by mouth every 4 (four) hours as needed for severe pain. 08/08/17   Orpah Greek, MD  oxyCODONE (OXYCONTIN) 40 mg 12 hr tablet Take 1 tablet (40 mg total) by mouth every 12 (twelve) hours. 08/08/17   Orpah Greek, MD  polyethylene glycol (MIRALAX / GLYCOLAX) packet Take 17 g by  mouth 2 (two) times daily. Hold if you develop diarrhea 08/01/17   Eugenie Filler, MD  promethazine (PHENERGAN) 25 MG tablet Take 1 tablet (25 mg total) by mouth every 6 (six) hours as needed for nausea or vomiting. 05/18/17   Tanner, Lyndon Code., PA-C  scopolamine (TRANSDERM-SCOP) 1 MG/3DAYS Place 1 patch (1.5 mg total) every 3 (three) days onto the skin. 07/13/17   Tanner, Lyndon Code., PA-C  senna (SENOKOT) 8.6 MG TABS tablet Take 2 tablets (17.2 mg total) by mouth 2 (two) times daily. 08/01/17   Eugenie Filler, MD  triamcinolone cream (KENALOG) 0.1 % Apply topically.  [provider]  Trolamine Salicylate (ASPERCREME EX) Apply 1 application topically.    [provider]    Physical Exam: Vitals:   08/08/17 2100 08/08/17 2358 08/09/17 0000 08/09/17 0058  BP: (!) 117/95 (!) 179/99  (!) 172/97  Pulse: (!) 104 (!) 104  (!) 101  Resp:  16 18 15   Temp:      TempSrc:      SpO2: 100% 100%  99%  Weight:      Height:          Constitutional: Moderately built and poorly nourished. Vitals:   08/08/17 2100 08/08/17 2358 08/09/17 0000 08/09/17 0058  BP: (!) 117/95 (!) 179/99  (!) 172/97  Pulse: (!) 104 (!) 104  (!) 101  Resp:  16 18 15   Temp:      TempSrc:      SpO2: 100% 100%  99%  Weight:      Height:       Eyes: Anicteric no pallor. ENMT: No discharge from the ears eyes nose or mouth. Neck: No JVD appreciated no mass felt. Respiratory: No rhonchi or crepitations. Cardiovascular: S1-S2 heard no murmurs appreciated. Abdomen: Soft nontender bowel sounds present. Musculoskeletal: No edema. Skin: No rash. Neurologic: Alert awake oriented to time place and person.  Moves all extremities. Psychiatric: Appears normal.   Labs on Admission: I have personally reviewed following labs and imaging studies  CBC: Recent Labs  Lab 08/05/17 0729 08/07/17 2334 08/08/17 1532 08/08/17 2051  WBC 3.7* 3.6* 3.7* 3.8*  NEUTROABS 1.5* 1.4* 2.4  --   HGB 9.4* 9.0* 9.7* 10.8*    HCT 29.2* 28.1* 29.5* 32.1*  MCV 88.2 88.1 86.3 85.6  PLT 156 185 205 767   Basic Metabolic Panel: Recent Labs  Lab 08/05/17 0729 08/07/17 2334 08/08/17 1532 08/08/17 2051  NA 142 141 139 139  K 3.6 3.5 3.4* 2.9*  CL 107 109 106 104  CO2 30 26 25 26   GLUCOSE 127* 91 100* 106*  BUN 8 6 >5* <5*  CREATININE 0.68 0.53 0.55 0.57  CALCIUM 9.0 8.6* 8.8* 9.0   GFR: Estimated Creatinine Clearance: 78.4 mL/min (by C-G formula based on SCr of 0.57 mg/dL). Liver Function Tests: Recent Labs  Lab 08/05/17 0729 08/07/17 2334 08/08/17 1532 08/08/17 2051  AST 21 22 24  32  ALT 14 13* 16 18  ALKPHOS 30* 30* 35* 40  BILITOT 0.2* 0.4 0.3 0.5  PROT 5.9* 5.9* 6.3* 7.1  ALBUMIN 3.5 3.4* 3.7 4.3   Recent Labs  Lab 08/07/17 2334 08/08/17 1532 08/08/17 2051  LIPASE 23 20 21    No results for input(s): AMMONIA in the last 168 hours. Coagulation Profile: Recent Labs  Lab 08/05/17 0729  INR 0.97   Cardiac Enzymes: No results for input(s): CKTOTAL, CKMB, CKMBINDEX, TROPONINI in the last 168 hours. BNP (last 3 results) No results for input(s): PROBNP in the last 8760 hours. HbA1C: No results for input(s): HGBA1C in the last 72 hours. CBG: No results for input(s): GLUCAP in the last 168 hours. Lipid Profile: No results for input(s): CHOL, HDL, LDLCALC, TRIG, CHOLHDL, LDLDIRECT in the last 72 hours. Thyroid Function Tests: No results for input(s): TSH, T4TOTAL, FREET4, T3FREE, THYROIDAB in the last 72 hours. Anemia Panel: No results for input(s): VITAMINB12, FOLATE, FERRITIN, TIBC, IRON, RETICCTPCT in the last 72 hours. Urine analysis:    Component Value Date/Time   COLORURINE STRAW (A) 08/08/2017 2056   APPEARANCEUR CLEAR 08/08/2017 2056   LABSPEC 1.009 08/08/2017 2056  LABSPEC 1.030 05/19/2017 1438   PHURINE 8.0 08/08/2017 2056   GLUCOSEU NEGATIVE 08/08/2017 2056   GLUCOSEU Negative 05/19/2017 1438   HGBUR NEGATIVE 08/08/2017 2056   HGBUR large 12/30/2009 1114    BILIRUBINUR NEGATIVE 08/08/2017 2056   BILIRUBINUR Negative 05/19/2017 1438   KETONESUR 20 (A) 08/08/2017 2056   PROTEINUR NEGATIVE 08/08/2017 2056   UROBILINOGEN 0.2 05/19/2017 1438   NITRITE NEGATIVE 08/08/2017 2056   LEUKOCYTESUR NEGATIVE 08/08/2017 2056   LEUKOCYTESUR Trace 05/19/2017 1438   Sepsis Labs: @LABRCNTIP (procalcitonin:4,lacticidven:4) )No results found for this or any previous visit (from the past 240 hour(s)).   Radiological Exams on Admission: Ct Abdomen Pelvis Wo Contrast  Result Date: 08/08/2017 CLINICAL DATA:  Left upper quadrant pain with abdominal distention. EXAM: CT ABDOMEN AND PELVIS WITHOUT CONTRAST TECHNIQUE: Multidetector CT imaging of the abdomen and pelvis was performed following the standard protocol without IV contrast. COMPARISON:  07/30/2017 FINDINGS: Lower chest:  Unremarkable. Hepatobiliary: Patient's known caudate lesion not well demonstrated on today's study. There is no evidence for gallstones, gallbladder wall thickening, or pericholecystic fluid. No intrahepatic or extrahepatic biliary dilation. Pancreas: No focal mass lesion. No dilatation of the main duct. No intraparenchymal cyst. No peripancreatic edema. Spleen: No splenomegaly. No focal mass lesion. Adrenals/Urinary Tract: No adrenal nodule or mass. No gross abnormality in either kidney on this noncontrast exam. No hydroureter. The urinary bladder appears normal for the degree of distention. Stomach/Bowel: Stomach is distended with fluid. Duodenum is normally positioned as is the ligament of Treitz. No small bowel wall thickening. No small bowel dilatation. The terminal ileum is normal. The appendix is normal. No gross colonic mass. No colonic wall thickening. No substantial diverticular change. Vascular/Lymphatic: There is abdominal aortic atherosclerosis without aneurysm. There is no gastrohepatic or hepatoduodenal ligament lymphadenopathy. No intraperitoneal or retroperitoneal lymphadenopathy. No  pelvic sidewall lymphadenopathy. Reproductive: Ive groin change noted in the uterus. Other: Cystic and solid mass in the cul-de-sac and left adnexal space is similar to prior. Soft tissue nodule anterior left lower quadrant is similar to prior. Musculoskeletal: Bone windows reveal no worrisome lytic or sclerotic osseous lesions. IMPRESSION: 1. No substantial interval change in exam although assessment today limited by lack of intravenous contrast material. 2. Stomach is distended with fluid, similar to prior. 3. Patient's known caudate liver lesion not well demonstrated on today's exam. 4. Similar appearance large complex cystic and solid mass in the cul-de-sac and left adnexal space. 5. Similar appearance abnormal soft tissue in the anterior left lower quadrant. 6. Fibroid change in the uterus. Electronically Signed   By: Misty Stanley M.D.   On: 08/08/2017 16:02     Assessment/Plan Active Problems:   Metastatic cholangiocarcinoma to lung (HCC)   Hypokalemia   Nausea & vomiting   Abdominal pain    1. Intractable abdominal pain likely from progression of metastatic cholangiocarcinoma and patient also missing her home dose of pain medications due to patient running out of it -I have restarted her home medications with as needed IV Dilaudid for now.  Gently hydrate. 2. Hypokalemia likely from nausea vomiting -replace and recheck. 3. Metastatic cholangiocarcinoma as per oncology. 4. Anemia appears to be chronic probably related to malignancy -follow CBC. 5. Elevated blood pressure -follow blood pressure trends.  Patient has been placed on as needed IV hydralazine   DVT prophylaxis: Lovenox. Code Status: Full code. Family Communication: Patient's husband. Disposition Plan: Home. Consults called: None. Admission status: Observation.   Rise Patience MD [facility] Pager [phone].  If  7PM-7AM, please contact night-coverage [email or web address] Password [email or web address]  08/09/2017, 1:33 AM

## 2017-08-10 ENCOUNTER — Encounter: Payer: Self-pay | Admitting: Gynecologic Oncology

## 2017-08-10 DIAGNOSIS — E440 Moderate protein-calorie malnutrition: Secondary | ICD-10-CM

## 2017-08-10 DIAGNOSIS — E860 Dehydration: Secondary | ICD-10-CM

## 2017-08-10 DIAGNOSIS — R1110 Vomiting, unspecified: Secondary | ICD-10-CM

## 2017-08-10 DIAGNOSIS — R109 Unspecified abdominal pain: Secondary | ICD-10-CM

## 2017-08-10 DIAGNOSIS — R112 Nausea with vomiting, unspecified: Secondary | ICD-10-CM

## 2017-08-10 LAB — CBC WITH DIFFERENTIAL/PLATELET
Basophils Absolute: 0 10*3/uL (ref 0.0–0.1)
Basophils Relative: 1 %
Eosinophils Absolute: 0 10*3/uL (ref 0.0–0.7)
Eosinophils Relative: 1 %
HCT: 31.1 % — ABNORMAL LOW (ref 36.0–46.0)
Hemoglobin: 10.1 g/dL — ABNORMAL LOW (ref 12.0–15.0)
Lymphocytes Relative: 53 %
Lymphs Abs: 2.2 10*3/uL (ref 0.7–4.0)
MCH: 28.1 pg (ref 26.0–34.0)
MCHC: 32.5 g/dL (ref 30.0–36.0)
MCV: 86.6 fL (ref 78.0–100.0)
Monocytes Absolute: 0.4 10*3/uL (ref 0.1–1.0)
Monocytes Relative: 9 %
Neutro Abs: 1.5 10*3/uL — ABNORMAL LOW (ref 1.7–7.7)
Neutrophils Relative %: 36 %
Platelets: 229 10*3/uL (ref 150–400)
RBC: 3.59 MIL/uL — ABNORMAL LOW (ref 3.87–5.11)
RDW: 14.2 % (ref 11.5–15.5)
WBC: 4.2 10*3/uL (ref 4.0–10.5)

## 2017-08-10 LAB — COMPREHENSIVE METABOLIC PANEL
ALT: 13 U/L — ABNORMAL LOW (ref 14–54)
AST: 20 U/L (ref 15–41)
Albumin: 3.6 g/dL (ref 3.5–5.0)
Alkaline Phosphatase: 33 U/L — ABNORMAL LOW (ref 38–126)
Anion gap: 6 (ref 5–15)
BUN: 7 mg/dL (ref 6–20)
CO2: 24 mmol/L (ref 22–32)
Calcium: 9.2 mg/dL (ref 8.9–10.3)
Chloride: 111 mmol/L (ref 101–111)
Creatinine, Ser: 0.57 mg/dL (ref 0.44–1.00)
GFR calc Af Amer: >60 mL/min (ref >60)
GFR calc non Af Amer: >60 mL/min (ref >60)
Glucose, Bld: 108 mg/dL — ABNORMAL HIGH (ref 65–99)
Potassium: 4.2 mmol/L (ref 3.5–5.1)
Sodium: 141 mmol/L (ref 135–145)
Total Bilirubin: 0.5 mg/dL (ref 0.3–1.2)
Total Protein: 6.4 g/dL — ABNORMAL LOW (ref 6.5–8.1)

## 2017-08-10 LAB — MAGNESIUM: Magnesium: 1.8 mg/dL (ref 1.7–2.4)

## 2017-08-10 MED ORDER — OXYCODONE HCL 10 MG PO TABS: 10.0000 mg | ORAL_TABLET | ORAL | 0 refills | Status: DC | PRN

## 2017-08-10 MED ORDER — OXYCODONE HCL ER 60 MG PO T12A: 60.0000 mg | EXTENDED_RELEASE_TABLET | Freq: Two times a day (BID) | ORAL | 0 refills | Status: DC

## 2017-08-10 NOTE — Progress Notes (Signed)
Initial Nutrition Assessment  DOCUMENTATION CODES:   Non-severe (moderate) malnutrition in context of chronic illness  INTERVENTION:    Carnation Instant Breakfast PO TID, each supplement provides 220 kcal and 13 grams of protein.    Discussed the importance of protein intake for preservation of lean body mass.   NUTRITION DIAGNOSIS:   Moderate Malnutrition related to chronic illness, cancer and cancer related treatments as evidenced by 7.5% weight loss in 3 months, energy intake < or equal to 75% for > or equal to 1 month, severe muscle depletion.  GOAL:   Patient will meet greater than or equal to 90% of their needs   MONITOR:   PO intake, Supplement acceptance, Weight trends, Labs  REASON FOR ASSESSMENT:   Malnutrition Screening Tool    ASSESSMENT:   Pt with PMH significant for metastatic cholangiocarcinoma diagnosed November 2017, GERD, HLD,and migraine headaches. Recently admitted for worsening abdominal pain. Presents this admission with similar symptoms likely from progression of metastatic cholangiocarcinoma and missing home pain medication dosage.    Spoke with pt and husband at bedside. Pt reports having off and on appetite related to chemotherapy treatments. States immediatly after chemotherapy her appetite decreases significantly and she experiences taste changes. On weeks that she doesn't have chemotherapy she tries to eat "excessive" calories to gain weight. Pt typically has 4-5 small meals per day and carnation instant breakfast BID. Pt began using Unjury protein powder two weeks ago. Provided tips on how to use protein powder. Pt amendable to carnation this hospital stay.   Pt noted to have lost 7.5% of body weight since September 2018 (133 lb to 123 lb). This is significant.   Limited Nutrition-Focused physical exam completed. Pt had jeans on making it hard to assess bilateral lower extremities.   Medications reviewed and include: marinol, megace Labs  reviewed: ALP 33 (H)   NUTRITION - FOCUSED PHYSICAL EXAM:    Most Recent Value  Orbital Region  No depletion  Upper Arm Region  No depletion  Thoracic and Lumbar Region  Unable to assess  Buccal Region  No depletion  Temple Region  No depletion  Clavicle Bone Region  Severe depletion  Clavicle and Acromion Bone Region  Severe depletion  Scapular Bone Region  Unable to assess  Dorsal Hand  Mild depletion  Patellar Region  Unable to assess  Anterior Thigh Region  Unable to assess  Posterior Calf Region  Unable to assess  Edema (RD Assessment)  None  Hair  Reviewed  Eyes  Reviewed  Mouth  Reviewed  Skin  Reviewed  Nails  Reviewed       Diet Order:  Diet regular Room service appropriate? Yes; Fluid consistency: Thin  EDUCATION NEEDS:   Education needs have been addressed  Skin:  Skin Assessment: Reviewed RN Assessment  Last BM:  08/08/17  Height:   Ht Readings from Last 1 Encounters:  08/09/17 5\' 6"  (1.676 m)    Weight:   Wt Readings from Last 1 Encounters:  08/09/17 123 lb 0.3 oz (55.8 kg)    Ideal Body Weight:  59.1 kg  BMI:  Body mass index is 19.86 kg/m.  Estimated Nutritional Needs:   Kcal:  1700-1900 kcal/day  Protein:  80-90 g/day  Fluid:  >1.9 L/day    Mariana Single RD, LDN Clinical Nutrition Pager # - [phone]

## 2017-08-10 NOTE — Care Management Note (Signed)
Case Management Note  Patient Details  Name: Nancy [name] MRN: [id number] Date of Birth: Oct 26, 1965  Subjective/Objective:                  Abd, pain  Action/Plan: Date: August 10, 2017 Velva Harman, BSN, [place], [place] Chart and notes review for patient progress and needs. Will follow for case management and discharge needs. Next review date: [id number]  Expected Discharge Date:  (unknown)               Expected Discharge Plan:  Home/Self Care  In-House Referral:     Discharge planning Services  CM Consult  Post Acute Care Choice:    Choice offered to:     DME Arranged:    DME Agency:     HH Arranged:    HH Agency:     Status of Service:  In process, will continue to follow  If discussed at Long Length of Stay Meetings, dates discussed:    Additional Comments:  Leeroy Cha, RN 08/10/2017, 8:39 AM

## 2017-08-10 NOTE — Discharge Summary (Signed)
Physician Discharge Summary  Nancy [name] MWN:[id number] DOB: [birth year] DOA: 08/08/2017  PCP: Nancy Held, DO  Admit date: 08/08/2017 Discharge date: 08/10/2017  Time spent: 35 minutes  Recommendations for Outpatient Follow-up:  1. Repeat basic metabolic panel to follow electrolytes and renal function 2. Repeat CBC to follow hemoglobin trend 3. Continue reassessing patient pain and further adjust her analgesics regimen as needed for better control.   4. Repeat vital signs and further assess patient blood pressure fluctuation to determine the need of antihypertensive regimen.   Discharge Diagnoses:  Active Problems:   Metastatic cholangiocarcinoma to lung (HCC)   Hypokalemia   Nausea & vomiting   Abdominal pain   Nausea and vomiting   Moderate protein-calorie malnutrition ([place])   Discharge Condition: Stable and improved.  Patient discharged back home with prescriptions for pain medication (15 days provided).  She will follow-up with her PCP in 1 week and also with oncology service as previously instructed.  Diet recommendation: Regular diet  Filed Weights   08/08/17 2040 08/09/17 1352  Weight: 59 kg (130 lb) 55.8 kg (123 lb 0.3 oz)    History of present illness:  As per H&P written by [name] on 08/09/17 51 y.o. female with history of cholangiocarcinoma presents to the ER because of worsening abdominal pain.  Patient was just recently discharged last week for similar complaints.  At that time patient's pain medication dose was increased.  Patient had come to the ER yesterday and had CT abdomen and pelvis done which did not show anything acute.  Patient has missed her pain medications for last 2 days as she ran out of it.  Patient got a refill but before she can take the prescription to the pharmacy, her pain got worse and she ended experiencing associated nausea/vomiting. Note, while in the ED patient continued to have severe pain which was only relieved by  IV pain medication and was admitted for further evaluation and pain management.  Hospital Course:  1-severe cancer pain: Worse after running out of pain medication. -Patient requires multiple doses of IV pain medication to control her symptoms -After discussing with oncology service decision was to increase her long-acting pain medication (OxyContin) to 60 mg every 12 hours and also to use oxycodone 10 mg every 3 hours as needed for severe pain and breakthrough. -Patient symptoms has been controlled with the use of these medication and the patient has been educated and instructed how to take them. -Patient will follow up with her PCP and also with oncology service and further adjustment into her pain management can be done as needed.  2-nausea/vomiting: -Most likely associated with mild opiates withdrawal and also associated with current metastatic cholangiocarcinoma. -Patient antiemetic medications will be continued -Opiates regimen for pain control was resumed -Patient symptoms resolved at discharge. -Advised to keep herself well-hydrated and to take medications as prescribed.  3-hypokalemia/dehydration: -Resolved with fluid resuscitation and electrolyte repletion -Will recommend repeat basic metabolic panel at follow-up to reassess patient electrolytes trend.  4-metastatic cholangiocarcinoma: -Patient will continue follow-up with oncology service -Prognosis is guarded currently.  5-moderate protein calorie malnutrition: In the context of chronic illness. -Patient has been instructed to continue the use of carnation instant breakfasts by mouth 3 times a day and to maintain a proper intake of protein in an attempt to preserve lean body mass. -Appreciate nutritional service instructions and recommendations given to the patient. -She is advised to keep herself well-hydrated.  6-elevated blood pressure, without prior history of hypertension -Most  likely associated with pain -No  medications have been started at this moment for blood pressure control -Recommend follow-up of her vital signs in multiple locations as an outpatient and decide if she will need or not antihypertensive meds.  7-anemia chronic disease: Associated with malignancy most likely -No transfusion needed -Repeat CBC as an outpatient to follow hemoglobin trend.  8-large ovarian cystic lesion  -outpatient follow up with GYN/Oncology service, already planned for 08/12/17  Procedures:  See below for x-ray reports  Consultations:  Oncology service  Discharge Exam: Vitals:   08/10/17 0452 08/10/17 1400  BP: 126/73 126/82  Pulse: 73 76  Resp: 18 17  Temp: 98.2 F (36.8 C) 97.9 F (36.6 C)  SpO2: 98% 100%    General: Afebrile, no chest pain, no shortness of breath, no nausea or vomiting.  Patient reports feeling a whole lot better and even is still experiencing some intermittent pain in her abdomen and mid back, reported that is better controlled with current medication regimen. Cardiovascular: S1-S2, no rubs, no gallops, no JVD Respiratory: Good air movement bilaterally, no wheezing, no crackles Abdomen: No distention, tenderness to palpation (diffusely), positive bowel sounds, no guarding. Extremities: No edema, no cyanosis, no clubbing.  Discharge Instructions   Discharge Instructions    Discharge instructions   Complete by:  As directed    Keep yourself well-hydrated Take medication as prescribed Follow-up with PCP in 1 week Follow-up with oncology service as previously instructed (first visit with the gynecologist oncologist on Friday, 08/12/17) and also with Dr. Burr Medico as previously instructed.     Allergies as of 08/10/2017      Reactions   Penicillins Anaphylaxis   Has patient had a PCN reaction causing immediate rash, facial/tongue/throat swelling, SOB or lightheadedness with hypotensionYes Swelling in throat  Has patient had a PCN reaction causing severe rash involving  mucus membranes or skin necrosis:/No Has patient had a PCN reaction that required hospitalization/No Has patient had a PCN reaction occurring within the last 10 years:NO If all of the above answers are "NO", then may proceed with Cephalosporin use.   Contrast Media [iodinated Diagnostic Agents] Other (See Comments)   Tingling under left rib area    Lipitor [atorvastatin] Palpitations   Tingling, flushing      Medication List    STOP taking these medications   HYDROmorphone 2 MG tablet Commonly known as:  DILAUDID     TAKE these medications   ALPRAZolam 0.5 MG tablet Commonly known as:  XANAX Take 1 tablet (0.5 mg total) by mouth 3 (three) times daily as needed for anxiety.   aspirin 81 MG tablet Take 81 mg by mouth daily.   cetirizine 10 MG tablet Commonly known as:  ZYRTEC Take 10 mg by mouth daily as needed for allergies. For allergies   COLOSTRUM PO Take 1.25 g by mouth 2 (two) times daily.   dicyclomine 20 MG tablet Commonly known as:  BENTYL Take 20 mg by mouth 3 (three) times daily as needed for spasms.   dronabinol 5 MG capsule Commonly known as:  MARINOL Take 1 capsule (5 mg total) by mouth 2 (two) times daily before a meal.   lidocaine-prilocaine cream Commonly known as:  EMLA Apply 1 application topically as needed. Apply to portacath 1 1/2 hours - 2 hours prior to procedures as needed.   LORazepam 0.5 MG tablet Commonly known as:  ATIVAN Place under tongue and dissolve every 8 hours as needed for nausea. Do not take Xanax while taking  this medication.   magnesium hydroxide 400 MG/5ML suspension Commonly known as:  MILK OF MAGNESIA Take 30 mLs by mouth daily as needed for mild constipation.   megestrol 400 MG/10ML suspension Commonly known as:  MEGACE Take 10 mLs (400 mg total) by mouth daily.   mometasone 0.1 % lotion Commonly known as:  ELOCON Apply topically daily. What changed:    how much to take  when to take this  reasons to take  this   OLANZapine 2.5 MG tablet Commonly known as:  ZYPREXA Take 1 tablet (2.5 mg total) by mouth at bedtime.   omeprazole 40 MG capsule Commonly known as:  PRILOSEC Take 1 capsule (40 mg total) by mouth 2 (two) times daily before a meal.   ondansetron 8 MG tablet Commonly known as:  ZOFRAN Take 1 tablet (8 mg total) by mouth every 8 (eight) hours as needed for nausea or vomiting.   oxyCODONE 60 MG 12 hr tablet Commonly known as:  OXYCONTIN Take 60 mg by mouth every 12 (twelve) hours. What changed:    medication strength  how much to take   Oxycodone HCl 10 MG Tabs Take 1 tablet (10 mg total) by mouth every 3 (three) hours as needed for severe pain or breakthrough pain. What changed:    medication strength  how much to take  when to take this  reasons to take this   polyethylene glycol packet Commonly known as:  MIRALAX / GLYCOLAX Take 17 g by mouth 2 (two) times daily. Hold if you develop diarrhea What changed:    when to take this  reasons to take this  additional instructions   promethazine 25 MG tablet Commonly known as:  PHENERGAN Take 1 tablet (25 mg total) by mouth every 6 (six) hours as needed for nausea or vomiting.   scopolamine 1 MG/3DAYS Commonly known as:  TRANSDERM-SCOP Place 1 patch (1.5 mg total) every 3 (three) days onto the skin.   senna 8.6 MG Tabs tablet Commonly known as:  SENOKOT Take 2 tablets (17.2 mg total) by mouth 2 (two) times daily.   Vitamin D3 5000 units Caps Take 5,000 Units by mouth daily.   Vitamin K2 40 MCG Tabs Take 40 mg by mouth daily.      Allergies  Allergen Reactions  . Penicillins Anaphylaxis    Has patient had a PCN reaction causing immediate rash, facial/tongue/throat swelling, SOB or lightheadedness with hypotensionYes Swelling in throat  Has patient had a PCN reaction causing severe rash involving mucus membranes or skin necrosis:/No Has patient had a PCN reaction that required hospitalization/No Has  patient had a PCN reaction occurring within the last 10 years:NO If all of the above answers are "NO", then may proceed with Cephalosporin use.   . Contrast Media [Iodinated Diagnostic Agents] Other (See Comments)    Tingling under left rib area   . Lipitor [Atorvastatin] Palpitations    Tingling, flushing   Follow-up Information    Nancy Held, DO. Schedule an appointment as soon as possible for a visit in 1 week(s).   Specialty:  Family Medicine Contact information: [address] [place] [address] [phone]           The results of significant diagnostics from this hospitalization (including imaging, microbiology, ancillary and laboratory) are listed below for reference.    Significant Diagnostic Studies: Ct Abdomen Pelvis Wo Contrast  Result Date: 08/08/2017 CLINICAL DATA:  Left upper quadrant pain with abdominal distention.  EXAM: CT ABDOMEN AND PELVIS WITHOUT CONTRAST TECHNIQUE: Multidetector CT imaging of the abdomen and pelvis was performed following the standard protocol without IV contrast. COMPARISON:  07/30/2017 FINDINGS: Lower chest:  Unremarkable. Hepatobiliary: Patient's known caudate lesion not well demonstrated on today's study. There is no evidence for gallstones, gallbladder wall thickening, or pericholecystic fluid. No intrahepatic or extrahepatic biliary dilation. Pancreas: No focal mass lesion. No dilatation of the main duct. No intraparenchymal cyst. No peripancreatic edema. Spleen: No splenomegaly. No focal mass lesion. Adrenals/Urinary Tract: No adrenal nodule or mass. No gross abnormality in either kidney on this noncontrast exam. No hydroureter. The urinary bladder appears normal for the degree of distention. Stomach/Bowel: Stomach is distended with fluid. Duodenum is normally positioned as is the ligament of Treitz. No small bowel wall thickening. No small bowel dilatation. The terminal ileum is normal. The appendix is normal. No  gross colonic mass. No colonic wall thickening. No substantial diverticular change. Vascular/Lymphatic: There is abdominal aortic atherosclerosis without aneurysm. There is no gastrohepatic or hepatoduodenal ligament lymphadenopathy. No intraperitoneal or retroperitoneal lymphadenopathy. No pelvic sidewall lymphadenopathy. Reproductive: Ive groin change noted in the uterus. Other: Cystic and solid mass in the cul-de-sac and left adnexal space is similar to prior. Soft tissue nodule anterior left lower quadrant is similar to prior. Musculoskeletal: Bone windows reveal no worrisome lytic or sclerotic osseous lesions. IMPRESSION: 1. No substantial interval change in exam although assessment today limited by lack of intravenous contrast material. 2. Stomach is distended with fluid, similar to prior. 3. Patient's known caudate liver lesion not well demonstrated on today's exam. 4. Similar appearance large complex cystic and solid mass in the cul-de-sac and left adnexal space. 5. Similar appearance abnormal soft tissue in the anterior left lower quadrant. 6. Fibroid change in the uterus. Electronically Signed   By: Misty Stanley M.D.   On: 08/08/2017 16:02   Ct Chest W Contrast  Result Date: 07/21/2017 CLINICAL DATA:  51 year old female with a history of cholangiocarcinoma diagnosed in November of 2017. Current complaint of diffuse abdominal pain and constipation for the past 3 days. EXAM: CT CHEST, ABDOMEN AND PELVIS WITHOUT CONTRAST TECHNIQUE: Multidetector CT imaging of the chest, abdomen and pelvis was performed following the standard protocol without IV contrast. COMPARISON:  Numerous prior studies including CT scans of chest (04/13/2014) and the abdomen and pelvis dated 04/13/2017 and 01/25/2017 FINDINGS: CT CHEST FINDINGS Cardiovascular: Right IJ approach single-lumen power injectable port catheter. Catheter tip in good position at the superior cavoatrial junction. The heart is normal in size. Conventional 3  vessel aortic arch anatomy. The aorta is normal in size. Calcifications are present along the proximal left anterior descending coronary artery. No pericardial effusion. Unremarkable main and central pulmonary arteries. Mediastinum/Nodes: Unremarkable CT appearance of the thyroid gland. No suspicious mediastinal or hilar adenopathy. No soft tissue mediastinal mass. The thoracic esophagus is unremarkable. Lungs/Pleura: Stable 3 mm left upper lobe pulmonary nodule (image 41, series 7). Stable 4 mm right upper lobe pulmonary nodule (image 62 series 7). These nodules were previously larger on prior imaging from November of 2017. Findings indicate persistent and unchanged response to therapy. New 3 mm nodule in the anterior aspect of the right upper lobe several cm away from the treated metastatic nodule described above on image 60 of series 7. Interval development of a 4 mm subpleural pulmonary nodule associated with the superior aspect of the left major fissure (image 44, series 7). In retrospect, this may have been present previously as a tiny 1  mm density. Musculoskeletal: No acute fracture or aggressive appearing lytic or blastic osseous lesion. CT ABDOMEN PELVIS FINDINGS Hepatobiliary: Slight interval enlargement in the size of the caudate lobe mass presently measuring 2.4 x 2.0 cm compared to 2.4 x 1.5 cm previously. The lesion is somewhat irregular in shape which makes precise measurements difficult. The previously noted segment 6 lesion remains similar to slightly smaller at a maximum of 0.7 cm. The conglomerate of necrotic aortocaval/ portacaval lymphadenopathy is enlarging now measuring 3.8 x 3.1 cm compared to 3.5 x 2.6 cm previously. Progressive geographic subcapsular low-attenuation along the anterior aspect of hepatic segment 4B and 3 adjacent to the fissure for the falciform ligament measures 2.5 x 0.9 cm. This was previously barely detectable at 0.8 cm and is favored to represent metastatic disease. The  gallbladder remains normal in appearance. No significant biliary ductal dilatation. Pancreas: The pancreas itself remains normal in appearance. Spleen: Normal in size without focal abnormality. Adrenals/Urinary Tract: Adrenal glands are unremarkable. Kidneys are normal, without renal calculi, focal lesion, or hydronephrosis. Bladder is unremarkable. Stomach/Bowel: No focal bowel wall thickening or evidence of obstruction. Vascular/Lymphatic: Minimal atherosclerotic vascular calcifications along the abdominal aorta. No aneurysm, or dissection. Progressive aortocaval and peripancreatic lymphadenopathy. Aortocaval lymph node posterior to the pancreatic neck now measures 1.7 cm compared to 1.3 cm previously. Reproductive: Marked interval enlargement of the complex cystic and solid mass arising from the left ovary. The mass has become large enough that it is not difficult to measure precisely. Using the coronal, and sagittal reformatted images the mass measures approximately 13 x 10 x 7.7 cm compared to a maximum of 6.8 x 4.1 cm previously. The mass exerts local mass effect on the uterus, bladder and rectosigmoid colon. Multi fibroid uterus again noted with multiple calcified and dystrophic uterine fibroids. The right ovary is unremarkable. Other: Enlarging omental masses in the left lateral inguinal recess. The more superior measures 1.7 x 1.8 cm while the more inferior measures 2.3 x 1.5 cm. No definite ascites. Musculoskeletal: No acute fracture or aggressive appearing lytic or blastic osseous lesion. IMPRESSION: CT CHEST 1. Interval development of 2 small new pulmonary nodules concerning for progressive, or new metastatic disease. Recommend continued attention on follow-up imaging. 2. The previously noted right upper and left upper lobe pulmonary nodules remain stable in size consistent with treated metastatic disease. 3. Stable position of right IJ approach power injectable port catheter. 4. Coronary artery  calcifications. CT ABD/PELVIS 1. Marked interval enlargement of a complex, enhancing, cystic and solid left ovarian mass which now fills the posterior aspect of the pelvis exerting mass effect on the adjacent rectosigmoid colon and bladder. This likely accounts for the patient's lower abdominal pain and sensation of constipation (there is no significant stool burden within the rectum or sigmoid colon). These findings are highly suspicious for a primary ovarian cystic or mucinous adenocarcinoma. 2. Enlarging omental implants in the left lower quadrant highly concerning for metastatic ovarian carcinoma. 3. New/enlarging subcapsular hepatic lesion may represent progressive cholangio or ovarian carcinoma metastases. 4. Enlarging caudate lobe lesion and aortocaval/portacaval confluent necrotic adenopathy concerning for progressive metastatic cholangiocarcinoma. 5. The segment 6 hepatic lesion remains stable or may be smaller on today's examination. These results will be called to the ordering clinician or representative by the Radiologist Assistant, and communication documented in the PACS or zVision Dashboard. Electronically Signed   By: Jacqulynn Cadet M.D.   On: 07/21/2017 09:54   Ct Abdomen Pelvis W Contrast  Result Date: 07/30/2017 CLINICAL DATA:  Left upper quadrant and left-sided abdominal pain. History of metastatic cholangiocarcinoma. EXAM: CT ABDOMEN AND PELVIS WITH CONTRAST TECHNIQUE: Multidetector CT imaging of the abdomen and pelvis was performed using the standard protocol following bolus administration of intravenous contrast. CONTRAST:  63mL ISOVUE-300 IOPAMIDOL (ISOVUE-300) INJECTION 61% COMPARISON:  07/20/2017 FINDINGS: Lower chest: Unremarkable. Hepatobiliary: Caudate lesion again identified in similar in appearance measuring 2.3 x 1.9 cm today compared to 2.4 x 2.0 cm previously. Anterior lesion at the falciform ligament is also stable measuring 2.4 x 0.8 cm today compared 2.5 x 0.9 cm  previously. The segment VI lesion described on the previous study is not readily evident today. There is no evidence for gallstones, gallbladder wall thickening, or pericholecystic fluid. No intrahepatic or extrahepatic biliary dilation. Pancreas: No focal mass lesion. No dilatation of the main duct. No intraparenchymal cyst. No peripancreatic edema. Spleen: No splenomegaly. No focal mass lesion. Adrenals/Urinary Tract: No adrenal nodule or mass. Kidneys unremarkable. No evidence for hydroureter. The urinary bladder appears normal for the degree of distention. Stomach/Bowel: Stomach is distended. Duodenum is normally positioned as is the ligament of Treitz. No small bowel wall thickening. No small bowel dilatation. Colon is nondilated sigmoid colon is decompressed and may have mild diffuse wall thickening but this is similar to prior. Vascular/Lymphatic: There is abdominal aortic atherosclerosis without aneurysm. Confluent nodal conglomeration in the hepatoduodenal ligament/porta hepatis is similar to prior measuring 3.9 x 3.0 cm today compared to 3.8 x 3.1 cm previously. This generates mass-effect on the main portal vein which remains patent. There is substantial attenuation of the left renal vein at the confluence to the IVC. Abnormal soft tissue nearly encases the right renal artery. No pelvic sidewall lymphadenopathy. Reproductive: The large cystic and solid lesion filling the cul-de-sac and left adnexal space is similar to prior. This lesion measures up to 13.1 cm craniocaudal length. Multiple calcified uterine fibroids are evident. Other: The abnormal soft tissues seen in the anterior left lower quadrant (image 59 series 2) is similar to prior there is trace intraperitoneal free fluid. Musculoskeletal: Bone windows reveal no worrisome lytic or sclerotic osseous lesions. IMPRESSION: 1. No substantial interval change in exam. 2. Complex cystic and solid mass filling the cul-de-sac and left adnexal space is  similar to prior. There is abnormal soft tissue in the anterior left lower quadrant concerning for metastatic disease. 3. Stable appearance of the caudate lesion in this patient with known cholangiocarcinoma. There is confluent necrotic lymphadenopathy in the porta hepatis, similar to prior with substantial mass-effect on the left renal vein as it enters the IVC. 4. Fibroid change in the uterus. 5. Trace intraperitoneal free fluid. Electronically Signed   By: Misty Stanley M.D.   On: 07/30/2017 13:44   Ct Abdomen Pelvis W Contrast  Result Date: 07/21/2017 CLINICAL DATA:  51 year old female with a history of cholangiocarcinoma diagnosed in November of 2017. Current complaint of diffuse abdominal pain and constipation for the past 3 days. EXAM: CT CHEST, ABDOMEN AND PELVIS WITHOUT CONTRAST TECHNIQUE: Multidetector CT imaging of the chest, abdomen and pelvis was performed following the standard protocol without IV contrast. COMPARISON:  Numerous prior studies including CT scans of chest (04/13/2014) and the abdomen and pelvis dated 04/13/2017 and 01/25/2017 FINDINGS: CT CHEST FINDINGS Cardiovascular: Right IJ approach single-lumen power injectable port catheter. Catheter tip in good position at the superior cavoatrial junction. The heart is normal in size. Conventional 3 vessel aortic arch anatomy. The aorta is normal in size. Calcifications are present along the proximal left  anterior descending coronary artery. No pericardial effusion. Unremarkable main and central pulmonary arteries. Mediastinum/Nodes: Unremarkable CT appearance of the thyroid gland. No suspicious mediastinal or hilar adenopathy. No soft tissue mediastinal mass. The thoracic esophagus is unremarkable. Lungs/Pleura: Stable 3 mm left upper lobe pulmonary nodule (image 41, series 7). Stable 4 mm right upper lobe pulmonary nodule (image 62 series 7). These nodules were previously larger on prior imaging from November of 2017. Findings indicate  persistent and unchanged response to therapy. New 3 mm nodule in the anterior aspect of the right upper lobe several cm away from the treated metastatic nodule described above on image 60 of series 7. Interval development of a 4 mm subpleural pulmonary nodule associated with the superior aspect of the left major fissure (image 44, series 7). In retrospect, this may have been present previously as a tiny 1 mm density. Musculoskeletal: No acute fracture or aggressive appearing lytic or blastic osseous lesion. CT ABDOMEN PELVIS FINDINGS Hepatobiliary: Slight interval enlargement in the size of the caudate lobe mass presently measuring 2.4 x 2.0 cm compared to 2.4 x 1.5 cm previously. The lesion is somewhat irregular in shape which makes precise measurements difficult. The previously noted segment 6 lesion remains similar to slightly smaller at a maximum of 0.7 cm. The conglomerate of necrotic aortocaval/ portacaval lymphadenopathy is enlarging now measuring 3.8 x 3.1 cm compared to 3.5 x 2.6 cm previously. Progressive geographic subcapsular low-attenuation along the anterior aspect of hepatic segment 4B and 3 adjacent to the fissure for the falciform ligament measures 2.5 x 0.9 cm. This was previously barely detectable at 0.8 cm and is favored to represent metastatic disease. The gallbladder remains normal in appearance. No significant biliary ductal dilatation. Pancreas: The pancreas itself remains normal in appearance. Spleen: Normal in size without focal abnormality. Adrenals/Urinary Tract: Adrenal glands are unremarkable. Kidneys are normal, without renal calculi, focal lesion, or hydronephrosis. Bladder is unremarkable. Stomach/Bowel: No focal bowel wall thickening or evidence of obstruction. Vascular/Lymphatic: Minimal atherosclerotic vascular calcifications along the abdominal aorta. No aneurysm, or dissection. Progressive aortocaval and peripancreatic lymphadenopathy. Aortocaval lymph node posterior to the  pancreatic neck now measures 1.7 cm compared to 1.3 cm previously. Reproductive: Marked interval enlargement of the complex cystic and solid mass arising from the left ovary. The mass has become large enough that it is not difficult to measure precisely. Using the coronal, and sagittal reformatted images the mass measures approximately 13 x 10 x 7.7 cm compared to a maximum of 6.8 x 4.1 cm previously. The mass exerts local mass effect on the uterus, bladder and rectosigmoid colon. Multi fibroid uterus again noted with multiple calcified and dystrophic uterine fibroids. The right ovary is unremarkable. Other: Enlarging omental masses in the left lateral inguinal recess. The more superior measures 1.7 x 1.8 cm while the more inferior measures 2.3 x 1.5 cm. No definite ascites. Musculoskeletal: No acute fracture or aggressive appearing lytic or blastic osseous lesion. IMPRESSION: CT CHEST 1. Interval development of 2 small new pulmonary nodules concerning for progressive, or new metastatic disease. Recommend continued attention on follow-up imaging. 2. The previously noted right upper and left upper lobe pulmonary nodules remain stable in size consistent with treated metastatic disease. 3. Stable position of right IJ approach power injectable port catheter. 4. Coronary artery calcifications. CT ABD/PELVIS 1. Marked interval enlargement of a complex, enhancing, cystic and solid left ovarian mass which now fills the posterior aspect of the pelvis exerting mass effect on the adjacent rectosigmoid colon and bladder. This  likely accounts for the patient's lower abdominal pain and sensation of constipation (there is no significant stool burden within the rectum or sigmoid colon). These findings are highly suspicious for a primary ovarian cystic or mucinous adenocarcinoma. 2. Enlarging omental implants in the left lower quadrant highly concerning for metastatic ovarian carcinoma. 3. New/enlarging subcapsular hepatic lesion  may represent progressive cholangio or ovarian carcinoma metastases. 4. Enlarging caudate lobe lesion and aortocaval/portacaval confluent necrotic adenopathy concerning for progressive metastatic cholangiocarcinoma. 5. The segment 6 hepatic lesion remains stable or may be smaller on today's examination. These results will be called to the ordering clinician or representative by the Radiologist Assistant, and communication documented in the PACS or zVision Dashboard. Electronically Signed   By: Jacqulynn Cadet M.D.   On: 07/21/2017 09:54   Ct Biopsy  Result Date: 08/05/2017 INDICATION: 51 year old female with cholangiocarcinoma. Suspicious peritoneal nodules. EXAM: CT-GUIDED BIOPSY OF PERITONEAL NODULE MEDICATIONS: None. ANESTHESIA/SEDATION: Moderate (conscious) sedation was employed during this procedure. A total of Versed 2.0 mg and Fentanyl 100 mcg was administered intravenously. Moderate Sedation Time: 16 minutes. The patient's level of consciousness and vital signs were monitored continuously by radiology nursing throughout the procedure under my direct supervision. FLUOROSCOPY TIME:  None COMPLICATIONS: None immediate. PROCEDURE: Informed written consent was obtained from the patient after a thorough discussion of the procedural risks, benefits and alternatives. All questions were addressed. A timeout was performed prior to the initiation of the procedure. CT images obtained with the patient in supine position. Peritoneal nodule in the left lower abdomen was identified. The left side of the abdomen was prepped with chlorhexidine and sterile field was created. Skin and soft tissues were anesthetized with 1% lidocaine. Using CT guidance, a 17 gauge coaxial needle was directed into the peritoneal nodule. Four core biopsies were obtained with an 18 gauge device and 3 adequate samples were obtained. Needle removed without complication. Specimens placed in formalin. Bandage placed over the puncture site.  FINDINGS: Again noted are irregular peritoneal nodules in the left lower quadrant of the abdomen. Needle was directed into a peritoneal nodule. Small amount of gas around the nodule and peritoneal space following the core biopsies. IMPRESSION: CT-guided core biopsies of a peritoneal nodule in the left lower quadrant. Electronically Signed   By: Markus Daft M.D.   On: 08/05/2017 11:31   Labs: Basic Metabolic Panel: Recent Labs  Lab 08/07/17 2334 08/08/17 1532 08/08/17 2051 08/09/17 0533 08/10/17 0518  NA 141 139 139 139 141  K 3.5 3.4* 2.9* 3.6 4.2  CL 109 106 104 106 111  CO2 26 25 26 23 24   GLUCOSE 91 100* 106* 109* 108*  BUN 6 >5* <5* <5* 7  CREATININE 0.53 0.55 0.57 0.43* 0.57  CALCIUM 8.6* 8.8* 9.0 8.8* 9.2  MG  --   --   --   --  1.8   Liver Function Tests: Recent Labs  Lab 08/07/17 2334 08/08/17 1532 08/08/17 2051 08/09/17 0533 08/10/17 0518  AST 22 24 32 25 20  ALT 13* 16 18 16  13*  ALKPHOS 30* 35* 40 33* 33*  BILITOT 0.4 0.3 0.5 1.0 0.5  PROT 5.9* 6.3* 7.1 6.5 6.4*  ALBUMIN 3.4* 3.7 4.3 3.8 3.6   Recent Labs  Lab 08/07/17 2334 08/08/17 1532 08/08/17 2051  LIPASE 23 20 21    No results for input(s): AMMONIA in the last 168 hours. CBC: Recent Labs  Lab 08/05/17 0729 08/07/17 2334 08/08/17 1532 08/08/17 2051 08/09/17 0533 08/10/17 0518  WBC 3.7* 3.6*  3.7* 3.8* 3.9* 4.2  NEUTROABS 1.5* 1.4* 2.4  --   --  1.5*  HGB 9.4* 9.0* 9.7* 10.8* 10.2* 10.1*  HCT 29.2* 28.1* 29.5* 32.1* 31.1* 31.1*  MCV 88.2 88.1 86.3 85.6 85.0 86.6  PLT 156 185 205 225 234 229    Signed:  Barton Dubois MD.  [facility] 08/10/2017, 5:31 PM

## 2017-08-11 ENCOUNTER — Other Ambulatory Visit: Payer: Federal, State, Local not specified - PPO

## 2017-08-11 ENCOUNTER — Other Ambulatory Visit: Payer: Self-pay | Admitting: *Deleted

## 2017-08-11 ENCOUNTER — Ambulatory Visit: Payer: Federal, State, Local not specified - PPO | Admitting: Nurse Practitioner

## 2017-08-11 NOTE — Progress Notes (Signed)
Consult Note: Gyn-Onc  Nancy [name] 51 y.o. female  CC: No chief complaint on file.   HPI: Patient is seen today in consultation at the request of Dr. Burr Medico.  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 The patient is status post cycle 3 of 5-FU and leucovorin last dosed on 06/27/2017.   She was admitted through the ER as she had more pain due to in part running out of her medications.   CT 5/18: Reproductive: Uterine fibroids. Probable complex cyst involving the left ovary at 4.5 x 3.4 cm.  CT 8/18: Reproductive: Fibroid uterus. Complex but hypodense left adnexal mass 6.7 by 4.1 cm, formerly 5.1 by 3.7 cm by my measurements.  CT 11/18: Reproductive: Marked interval enlargement of the complex cystic and solid mass arising from the left ovary. The mass has become large enough that it is not difficult to measure precisely. Using the coronal, and sagittal reformatted images the mass measures approximately 13 x 10 x 7.7 cm compared to a maximum of 6.8 x 4.1 cm previously. The mass exerts local mass effect on the uterus, bladder and rectosigmoid colon. Multi fibroid uterus again noted with multiple calcified and dystrophic uterine fibroids. The right ovary is unremarkable.  CT 12/18: Other: Cystic and solid mass in the cul-de-sac and left adnexal space is similar to prior. Soft tissue nodule anterior left lower quadrant is similar to prior.  Musculoskeletal: Bone windows reveal no worrisome lytic or sclerotic osseous lesions.  IMPRESSION: 1. No substantial interval change in exam although assessment today limited by lack of intravenous contrast material. 2. Stomach is distended with fluid, similar to prior. 3. Patient's known caudate liver lesion not well demonstrated on today's exam. 4. Similar appearance large complex cystic and solid mass in the cul-de-sac and left adnexal space. 5. Similar appearance  abnormal soft tissue in the anterior left lower quadrant. 6. Fibroid change in the uterus.  FNA 08/05/17: Diagnosis Peritoneum, biopsy, left lower abdomen METASTATIC ADENOCARCINOMA, CONSISTENT WITH CHOLANGIOCARCINOMA. Microscopic Comment The adenocarcinoma stains positive for ck7, negative for ER, GATA 3, ck20, cdx2, and TTF-1. Pax 8 (patchy stain). The morphology and immunostain pattern support the above diagnosis. The block B is sent to [facility] for further molecular testing.  We were asked to come see the patient to discuss the role of surgical debulking of her adnexal mass. This was prior to the results from the biopsy returning. I had a lengthy meeting with the patient and her aunt. The patient's husband had stepped away.  Her pain is better controlled today with her new pain regimen.            Current Meds:  Outpatient Encounter Medications as of 08/10/2017  Medication Sig  . ALPRAZolam (XANAX) 0.5 MG tablet Take 1 tablet (0.5 mg total) by mouth 3 (three) times daily as needed for anxiety.  Marland Kitchen aspirin 81 MG tablet Take 81 mg by mouth daily.  . cetirizine (ZYRTEC) 10 MG tablet Take 10 mg by mouth daily as needed for allergies. For allergies   . Cholecalciferol (VITAMIN D3) 5000 UNITS CAPS Take 5,000 Units by mouth daily.   . COLOSTRUM PO Take 1.25 g by mouth 2 (two) times daily.   Marland Kitchen dicyclomine (BENTYL) 20 MG tablet Take 20 mg by mouth 3 (three) times daily as needed for spasms.   Marland Kitchen dronabinol (MARINOL) 5 MG capsule Take 1 capsule (5 mg total) by mouth 2 (two) times daily before  a meal.  . lidocaine-prilocaine (EMLA) cream Apply 1 application topically as needed. Apply to portacath 1 1/2 hours - 2 hours prior to procedures as needed.  Marland Kitchen LORazepam (ATIVAN) 0.5 MG tablet Place under tongue and dissolve every 8 hours as needed for nausea. Do not take Xanax while taking this medication.  . magnesium hydroxide (MILK OF MAGNESIA) 400 MG/5ML suspension Take 30 mLs by mouth daily as  needed for mild constipation.  . megestrol (MEGACE) 400 MG/10ML suspension Take 10 mLs (400 mg total) by mouth daily.  . Menaquinone-7 (VITAMIN K2) 40 MCG TABS Take 40 mg by mouth daily.   . mometasone (ELOCON) 0.1 % lotion Apply topically daily. (Patient taking differently: Apply 1 application topically daily as needed (itching). )  . OLANZapine (ZYPREXA) 2.5 MG tablet Take 1 tablet (2.5 mg total) by mouth at bedtime. (Patient not taking: Reported on 08/09/2017)  . omeprazole (PRILOSEC) 40 MG capsule Take 1 capsule (40 mg total) by mouth 2 (two) times daily before a meal.  . ondansetron (ZOFRAN) 8 MG tablet Take 1 tablet (8 mg total) by mouth every 8 (eight) hours as needed for nausea or vomiting.  Marland Kitchen oxyCODONE (OXYCONTIN) 60 MG 12 hr tablet Take 60 mg by mouth every 12 (twelve) hours.  . Oxycodone HCl 10 MG TABS Take 1 tablet (10 mg total) by mouth every 3 (three) hours as needed.  . polyethylene glycol (MIRALAX / GLYCOLAX) packet Take 17 g by mouth 2 (two) times daily. Hold if you develop diarrhea (Patient taking differently: Take 17 g by mouth 2 (two) times daily as needed for moderate constipation. Hold if you develop diarrhea)  . promethazine (PHENERGAN) 25 MG tablet Take 1 tablet (25 mg total) by mouth every 6 (six) hours as needed for nausea or vomiting.  Marland Kitchen scopolamine (TRANSDERM-SCOP) 1 MG/3DAYS Place 1 patch (1.5 mg total) every 3 (three) days onto the skin.  Marland Kitchen senna (SENOKOT) 8.6 MG TABS tablet Take 2 tablets (17.2 mg total) by mouth 2 (two) times daily.   Facility-Administered Encounter Medications as of 08/10/2017  Medication  . alteplase (CATHFLO ACTIVASE) injection 2 mg  . heparin lock flush 100 unit/mL  . ondansetron (ZOFRAN) injection 8 mg  . sodium chloride flush (NS) 0.9 % injection 10 mL  . sodium chloride flush (NS) 0.9 % injection 10 mL  . sodium chloride flush (NS) 0.9 % injection 10 mL    Allergy:  Allergies  Allergen Reactions  . Penicillins Anaphylaxis    Has  patient had a PCN reaction causing immediate rash, facial/tongue/throat swelling, SOB or lightheadedness with hypotensionYes Swelling in throat  Has patient had a PCN reaction causing severe rash involving mucus membranes or skin necrosis:/No Has patient had a PCN reaction that required hospitalization/No Has patient had a PCN reaction occurring within the last 10 years:NO If all of the above answers are "NO", then may proceed with Cephalosporin use.   . Contrast Media [Iodinated Diagnostic Agents] Other (See Comments)    Tingling under left rib area   . Lipitor [Atorvastatin] Palpitations    Tingling, flushing    Social Hx:   Social History   Socioeconomic History  . Marital status: Married    Spouse name: Not on file  . Number of children: 0  . Years of education: 13  . Highest education level: Not on file  Social Needs  . Financial resource strain: Not on file  . Food insecurity - worry: Not on file  . Food insecurity - inability:  Not on file  . Transportation needs - medical: Not on file  . Transportation needs - non-medical: Not on file  Occupational History  . Occupation: HR fml[employer]    Employer: [employer]    Employer: [employer]  Tobacco Use  . Smoking status: Never Smoker  . Smokeless tobacco: Never Used  Substance and Sexual Activity  . Alcohol use: Yes    Comment: maybe 3 times a year  . Drug use: No  . Sexual activity: Yes    Partners: Male  Other Topics Concern  . Not on file  Social History Narrative  . Not on file    Past Surgical Hx:  Past Surgical History:  Procedure Laterality Date  . EUS N/A 07/07/2016   Procedure: UPPER ENDOSCOPIC ULTRASOUND (EUS) RADIAL;  Surgeon: [name], MD;  Location: [facility] ENDOSCOPY;  Service: Endoscopy;  Laterality: N/A;  . IR GENERIC HISTORICAL  08/03/2016   IR US GUIDE VASC ACCESS RIGHT 08/03/2016 Corrie Mckusick, DO [facility]  . IR GENERIC HISTORICAL  08/03/2016   IR FLUORO GUIDE PORT INSERTION RIGHT 08/03/2016 Corrie Mckusick, DO [facility]  . MYOMECTOMY  2002    Past Medical Hx:  Past Medical History:  Diagnosis Date  . Allergy    SEASONAL  . Anemia    HIGH SCHOOL  . Atopic eczema   . cholangio ca dx'd 07/12/16  . Fibrocystic breast disease   . GERD (gastroesophageal reflux disease)   . Hx of migraines    seasonal   . Personal history of chemotherapy   . Pneumonia    6 years ago   . TIA (transient ischemic attack)     Oncology Hx:  Oncology History   Metastatic cholangiocarcinoma to lung [facility])   Staging form: Perihilar Bile Ducts, AJCC 7th Edition   - Clinical stage from 07/07/2016: Stage IVB (T2b, N1, M1) - Signed by Truitt Merle, MD on 07/28/2016      Metastatic cholangiocarcinoma to lung ([place])   05/22/2014 Procedure    Routine screening colonoscopy showed a sessile polyp measuring 4 mm in the sigmoid colon, removed, otherwise negative.      06/29/2016 Imaging    CT chest, abdomen and pelvis showed 2 indeterminant nodule in left and the right lung, 4-69mm, indeterminate a hypovascular liver lesions largest in the caudate lobe measuring 2.6 cm, mild abdominal lymphadenopathy in the gastrohepatic ligament and portocaval space.      07/02/2016 Imaging    Abdominal MRI with and without contrast showed 2 lobular lesions in the caudate lobe, and is regular lymph nodes in the gastrohepatic ligament which is adjacent to the liver lesion, suspicious for malignancy. 2 small lesions in the right hepatic lobe are indeterminate, but concerning for metastasis.      07/07/2016 Procedure    EGD was negative, EUS biopsy of the liver and adjacent lymph node      07/07/2016 Initial Diagnosis    Metastatic cholangiocarcinoma to lung ([place])      07/07/2016 Initial Biopsy    Fine-needle aspiration of the liver lesion in caudate lobe and adjacent lymph nodes both showed metastatic adenocarcinoma.      08/11/2016 - 04/25/2017 Chemotherapy    Cisplatin 25 mg/m, gemcitabine 1000 mg/m, on day one and 8,  every 21 days   Cisplatin and gemcitabine on Day 1, 8 every 21 days, with neulasta on day 9, Gemcitabine dosed reduced to 800 mg/m due to cytopenias, increased to 900mg /m2 on 12/06/2016. Changes to gemcitabine maintenance therapy on  day 1 and 8 every 21 days, from 04/18/2017, gemcitabine was reduced dose to 400 mg/m on 04/25/17       10/18/2016 Imaging    CT CAP  IMPRESSION: 1. Again noted are multifocal liver metastasis. Overall there has been no significant interval change in overall volume of liver metastases. 2. Similar appearance of upper abdominal adenopathy 3. Slight decrease in size of small pulmonary metastases.      01/25/2017 Imaging    CT CAP W Contrast 01/25/17 IMPRESSION: 1. Response to therapy. 2. Similar to minimal decrease in pulmonary nodules/metastasis. 3. Improvement in hepatic disease burden. 4. Decrease in abdominal adenopathy. 5. Significantly age advanced atherosclerosis, including within the coronary arteries. 6. Uterine fibroids. 7. Suspect a complex cyst in the left ovary. Recommend attention on follow-up.       04/13/2017 Imaging    CT CAP W Contrast IMPRESSION: 1. The dominant caudate lobe mass is stable in size and appearance, as is the indistinct adenopathy in the porta hepatis adjacent to the caudate lobe, and in the retroperitoneum. Several subtle additional liver lesions are slightly less conspicuous on today' s exam. The pulmonary nodules are stable, and mild omental nodularity in the left lower quadrant is very slightly worsened compared to prior. Overall the burden of malignancy appears similar to prior, and some of the findings may represent effectively treated metastatic lesions. 2. Complex left adnexal mass, 6.7 by 4.1 cm, enlarging. Pelvic sonography recommended for further characterization. 3. Other imaging findings of potential clinical significance: Aortic Atherosclerosis (ICD10-I70.0). Coronary atherosclerosis. Prominent stool  throughout the colon favors constipation. Fibroid uterus.      05/16/2017 -  Chemotherapy    Second-line chemo FOLFOX every 2 weeks with neulasta after pump DC      07/20/2017 Imaging    IMPRESSION: CT CHEST  1. Interval development of 2 small new pulmonary nodules concerning for progressive, or new metastatic disease. Recommend continued attention on follow-up imaging. 2. The previously noted right upper and left upper lobe pulmonary nodules remain stable in size consistent with treated metastatic disease. 3. Stable position of right IJ approach power injectable port catheter. 4. Coronary artery calcifications. CT ABD/PELVIS  1. Marked interval enlargement of a complex, enhancing, cystic and solid left ovarian mass which now fills the posterior aspect of the pelvis exerting mass effect on the adjacent rectosigmoid colon and bladder. This likely accounts for the patient's lower abdominal pain and sensation of constipation (there is no significant stool burden within the rectum or sigmoid colon). These findings are highly suspicious for a primary ovarian cystic or mucinous adenocarcinoma. 2. Enlarging omental implants in the left lower quadrant highly concerning for metastatic ovarian carcinoma. 3. New/enlarging subcapsular hepatic lesion may represent progressive cholangio or ovarian carcinoma metastases. 4. Enlarging caudate lobe lesion and aortocaval/portacaval confluent necrotic adenopathy concerning for progressive metastatic cholangiocarcinoma. 5. The segment 6 hepatic lesion remains stable or may be smaller on today's examination. These results will be called to the ordering clinician or representative by the Radiologist Assistant, and communication documented in the PACS or zVision Dashboard.         Family Hx:  Family History  Problem Relation Age of Onset  . Gout Mother   . Diabetes Mother   . Hypertension Father   . Diabetes Father   . Breast cancer  Paternal Aunt   . Cancer Maternal Grandfather        throat cancer   . Cancer Cousin 57       GI cancer   .  Thyroid disease Neg Hx     Vitals:  There were no vitals taken for this visit.  Physical Exam: WN/WD thin female laying comfortably in her hospital bed.  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 The patient is status post cycle 3 of 5-FU and leucovorin last dosed on 06/27/2017.  She has clearly progressive disease in the liver, peritoneal cavity and most likely the left adnexal mass represents an ovarian metastasis of her cholangiocarcinoma.  Goals of care discussed with the patient and her aunt. We discussed that while surgery to remove the ovarian mass is possible, that it would most likely delay her ability to start a new chemotherapy regimen for her metastatic disease and that ultimately this may be detrimental to her. I also cannot honestly tell her that removal of this mass would make a big difference with regards to her abdominal pain as she has many other sites of disease that could be contributing to her pain.  She asked that we review scans going back to early this year which I did and I graphically showed her the change in size of the left ovarian mass.  After our meeting, she wishes to go home when able and then will follow up with Dr. Burr Medico for discussion of next chemotherapy regimen. She understands that we will be happy to reconsider surgery if she or Dr. Burr Medico think that this would be helpful to her some time in the future for palliation of symptoms.  We appreciate the opportunity to partner in the care of this very pleasant patient.  Nancy [name] A., MD 08/11/2017, 10:13 AM

## 2017-08-12 ENCOUNTER — Telehealth: Payer: Self-pay | Admitting: Hematology

## 2017-08-12 ENCOUNTER — Other Ambulatory Visit: Payer: Self-pay | Admitting: Hematology

## 2017-08-12 ENCOUNTER — Ambulatory Visit: Payer: Federal, State, Local not specified - PPO | Admitting: Gynecology

## 2017-08-12 ENCOUNTER — Other Ambulatory Visit: Payer: Self-pay | Admitting: *Deleted

## 2017-08-12 ENCOUNTER — Telehealth: Payer: Self-pay | Admitting: Pharmacist

## 2017-08-12 DIAGNOSIS — C7800 Secondary malignant neoplasm of unspecified lung: Principal | ICD-10-CM

## 2017-08-12 DIAGNOSIS — C221 Intrahepatic bile duct carcinoma: Secondary | ICD-10-CM

## 2017-08-12 MED ORDER — CAPECITABINE 500 MG PO TABS: 1000.0000 mg/m2 | ORAL_TABLET | Freq: Two times a day (BID) | ORAL | 1 refills | Status: DC

## 2017-08-12 MED ORDER — LIDOCAINE-PRILOCAINE 2.5-2.5 % EX CREA: 1.0000 "application " | TOPICAL_CREAM | CUTANEOUS | 1 refills | Status: AC | PRN

## 2017-08-12 NOTE — Progress Notes (Unsigned)
xeloda

## 2017-08-12 NOTE — Progress Notes (Signed)
OFF PATHWAY REGIMEN - [Other Dx]  No Change  Continue With Treatment as Ordered.   [id number]:FOLFOX (q14d):   A cycle is every 14 days:     Oxaliplatin      Leucovorin      5-Fluorouracil      5-Fluorouracil   **Always confirm dose/schedule in your pharmacy ordering system**    Patient Characteristics: Intent of Therapy: Non-Curative / Palliative Intent, Discussed with Patient

## 2017-08-12 NOTE — Telephone Encounter (Signed)
Oral Chemotherapy Pharmacist Encounter   Attempted to reach patient for update oral medication: Xeloda. No answer. Left VM for patient to call back with any questions or issues.   Thank you,  Johny Drilling, PharmD, BCPS, BCOP 08/12/2017   1:33 PM  Oral Oncology Clinic [phone]

## 2017-08-12 NOTE — Telephone Encounter (Signed)
Scheduled appt per 12/14 sch msg - spoke with patient regarding appts

## 2017-08-12 NOTE — Telephone Encounter (Signed)
Oral Oncology Pharmacist Encounter  Received new prescription for Xeloda (capecitabine) for the treatment of previously treated, metastatic cholangiocarcinoma in conjunction with infusional oxaliplatin, planned duration until disease progression or unacceptable toxicity.  Labs from 08/10/17 assessed, OK for treatment.  Current medication list in Epic reviewed, no significant DDIs with Xeloda identified.  Prescription has been e-scribed to the [facility] for benefits analysis and approval. Test claim revealed copayment $0 at this time for 28 day supply.  Oral Oncology Clinic will continue to follow for initial counseling and start date.  Johny Drilling, PharmD, BCPS, BCOP 08/12/2017 1:23 PM Oral Oncology Clinic [phone]

## 2017-08-12 NOTE — Progress Notes (Signed)
DISCONTINUE OFF PATHWAY REGIMEN - [Other Dx]   [id number] (q14d):   A cycle is every 14 days:     Oxaliplatin      Leucovorin      5-Fluorouracil      5-Fluorouracil   **Always confirm dose/schedule in your pharmacy ordering system**    REASON: Disease Progression PRIOR TREATMENT: FOLFOX (q14d) TREATMENT RESPONSE: Progressive Disease (PD)  START OFF PATHWAY REGIMEN - [Other Dx]   [id number]:Capecitabine + Oxaliplatin (850/130):   A cycle is every 21 days:     Capecitabine      Oxaliplatin   **Always confirm dose/schedule in your pharmacy ordering system**    Patient Characteristics: Intent of Therapy: Non-Curative / Palliative Intent, Discussed with Patient

## 2017-08-15 ENCOUNTER — Other Ambulatory Visit: Payer: Self-pay | Admitting: Hematology

## 2017-08-15 MED FILL — CAPECITABINE 500 MG TABLET: 500 | 28 days supply | Qty: 84 | Fill #0

## 2017-08-15 NOTE — Progress Notes (Signed)
[name]  [phone] [phone]  Clinic Follow Up Note   Patient Care Team: Carollee Herter, Alferd Apa, DO as PCP - General Pyrtle, Lajuan Lines, MD as Consulting Physician (Gastroenterology)   Date of Service:  08/16/2017  CHIEF COMPLAINTS:  Follow up metastatic intrahepatic cholangiocarcinoma  Oncology History   Metastatic cholangiocarcinoma to lung [facility])   Staging form: Perihilar Bile Ducts, AJCC 7th Edition   - Clinical stage from 07/07/2016: Stage IVB (T2b, N1, M1) - Signed by Truitt Merle, MD on 07/28/2016      Metastatic cholangiocarcinoma to lung ([place])   05/22/2014 Procedure    Routine screening colonoscopy showed a sessile polyp measuring 4 mm in the sigmoid colon, removed, otherwise negative.      06/29/2016 Imaging    CT chest, abdomen and pelvis showed 2 indeterminant nodule in left and the right lung, 4-48mm, indeterminate a hypovascular liver lesions largest in the caudate lobe measuring 2.6 cm, mild abdominal lymphadenopathy in the gastrohepatic ligament and portocaval space.      07/02/2016 Imaging    Abdominal MRI with and without contrast showed 2 lobular lesions in the caudate lobe, and is regular lymph nodes in the gastrohepatic ligament which is adjacent to the liver lesion, suspicious for malignancy. 2 small lesions in the right hepatic lobe are indeterminate, but concerning for metastasis.      07/07/2016 Procedure    EGD was negative, EUS biopsy of the liver and adjacent lymph node      07/07/2016 Initial Diagnosis    Metastatic cholangiocarcinoma to lung ([place])      07/07/2016 Initial Biopsy    Fine-needle aspiration of the liver lesion in caudate lobe and adjacent lymph nodes both showed metastatic adenocarcinoma.      08/11/2016 - 04/25/2017 Chemotherapy    Cisplatin 25 mg/m, gemcitabine 1000 mg/m, on day one and 8, every 21 days   Cisplatin and gemcitabine on Day 1, 8 every 21 days, with neulasta on day 9, Gemcitabine  dosed reduced to 800 mg/m due to cytopenias, increased to 900mg /m2 on 12/06/2016. Changes to gemcitabine maintenance therapy on day 1 and 8 every 21 days, from 04/18/2017, gemcitabine was reduced dose to 400 mg/m on 04/25/17       10/18/2016 Imaging    CT CAP  IMPRESSION: 1. Again noted are multifocal liver metastasis. Overall there has been no significant interval change in overall volume of liver metastases. 2. Similar appearance of upper abdominal adenopathy 3. Slight decrease in size of small pulmonary metastases.      01/25/2017 Imaging    CT CAP W Contrast 01/25/17 IMPRESSION: 1. Response to therapy. 2. Similar to minimal decrease in pulmonary nodules/metastasis. 3. Improvement in hepatic disease burden. 4. Decrease in abdominal adenopathy. 5. Significantly age advanced atherosclerosis, including within the coronary arteries. 6. Uterine fibroids. 7. Suspect a complex cyst in the left ovary. Recommend attention on follow-up.       04/13/2017 Imaging    CT CAP W Contrast IMPRESSION: 1. The dominant caudate lobe mass is stable in size and appearance, as is the indistinct adenopathy in the porta hepatis adjacent to the caudate lobe, and in the retroperitoneum. Several subtle additional liver lesions are slightly less conspicuous on today' s exam. The pulmonary nodules are stable, and mild omental nodularity in the left lower quadrant is very slightly worsened compared to prior. Overall the burden of malignancy appears similar to prior, and some of the findings may represent effectively treated metastatic lesions. 2. Complex left  adnexal mass, 6.7 by 4.1 cm, enlarging. Pelvic sonography recommended for further characterization. 3. Other imaging findings of potential clinical significance: Aortic Atherosclerosis (ICD10-I70.0). Coronary atherosclerosis. Prominent stool throughout the colon favors constipation. Fibroid uterus.      05/16/2017 - 06/27/2017 Chemotherapy     Second-line chemo FOLFOX every 2 weeks with neulasta after pump DC. She will started with low dose FOLFOX on 05/16/17, due to her neutropenia. Held oxaliplatin with cycle two due to poor toleration. Discontinued after cycle 3 (06/27/17) due to disease progression.       07/20/2017 Imaging    IMPRESSION: CT CHEST  1. Interval development of 2 small new pulmonary nodules concerning for progressive, or new metastatic disease. Recommend continued attention on follow-up imaging. 2. The previously noted right upper and left upper lobe pulmonary nodules remain stable in size consistent with treated metastatic disease. 3. Stable position of right IJ approach power injectable port catheter. 4. Coronary artery calcifications. CT ABD/PELVIS  1. Marked interval enlargement of a complex, enhancing, cystic and solid left ovarian mass which now fills the posterior aspect of the pelvis exerting mass effect on the adjacent rectosigmoid colon and bladder. This likely accounts for the patient's lower abdominal pain and sensation of constipation (there is no significant stool burden within the rectum or sigmoid colon). These findings are highly suspicious for a primary ovarian cystic or mucinous adenocarcinoma. 2. Enlarging omental implants in the left lower quadrant highly concerning for metastatic ovarian carcinoma. 3. New/enlarging subcapsular hepatic lesion may represent progressive cholangio or ovarian carcinoma metastases. 4. Enlarging caudate lobe lesion and aortocaval/portacaval confluent necrotic adenopathy concerning for progressive metastatic cholangiocarcinoma. 5. The segment 6 hepatic lesion remains stable or may be smaller on today's examination. These results will be called to the ordering clinician or representative by the Radiologist Assistant, and communication documented in the PACS or zVision Dashboard.        08/05/2017 Pathology Results    Diagnosis 08/05/17  Peritoneum,  biopsy, left lower abdomen METASTATIC ADENOCARCINOMA, CONSISTENT WITH CHOLANGIOCARCINOMA. Microscopic Comment      08/16/2017 -  Chemotherapy    CAPOX: Xeloda 1500mg  BID for 1 week on and 1 week off with low dose oxaliplatin starting 08/16/17         HISTORY OF PRESENTING ILLNESS: 07/28/16 Nancy [name] 51 y.o. female is here because of Her recently diagnosed metastatic glandular carcinoma. She is accompanied by her husband and mother to my clinic today.  She has been having RUQ abdominal pain since mid 04/2016, she was seen by PCP and was treated for gas with GI cocktail and pepcide, which she did not help much. Her pain got worse, and radiates to right shoulder, she denies significant nausea, or bloating, BM normal, no fever, cough or dyspnea. She recently noticed mild chest discomfort in the low sternum area. She initially had the lab, ultrasound done by her primary care physician, which was unrevealing. She was referred to GI Dr. Hilarie Fredrickson and underwent EGD and CT scan, which showed a lobulated mass in the caudate lobe of liver, with at adjacent adenopathy. He underwent EUS and fine-needle biopsy of the liver mass and lymph nodes, all reviewed adenocarcinoma.  She has lost about 7lbs in the past 3 months. She has mild fatigue, but able to function at home. She has stopped working due to her abdominal pain and fatigue. She takes Norco every 1-3 times a day, and her pain seems not well controlled.  CURRENT THERAPY: Third line CAPOX: Xeloda 1500mg  BID for 1  week on and 1 week off with low dose oxaliplatin every 2 weeks starting 08/16/17    INTERIM HISTORY:  Nancy [name] returns for follow up and is accompanied by her husband and aunt. She notes she is working on a pain regimen to feel better. Currently she is taking oxycodone 10mg  q3hours and OxyContin 60mg  BID. She still has  dilaudid for severe pain. She is able to sleep without needing pain meds. Her pain returns every 3 hours at a duller rate.  She still has megace that she uses occasionally. She notes she is eating better. Her aunt is helping her eat healthier. This has helped her gain some weight back. She is able to be more active now. She tries to be up more than 50% of the time.     REVIEW OF SYSTEMS: Constitutional: Denies fevers, chills or abnormal night sweats(+) improved appetite  (+) weight gain (+) more energy  Eyes: Denies blurriness of vision, double vision or watery eyes Ears, nose, mouth, throat, and face: Denies mucositis or sore throat Respiratory: Denies dyspnea or wheezes Cardiovascular: Denies palpitation, chest discomfort or lower extremity swelling Gastrointestinal:  Denies heartburn or change in bowel habits  (+) better controlled nausea and abdominal pain Skin: Denies abnormal skin rashes  Lymphatics: Denies new lymphadenopathy or easy bruising Neurological:Denies numbness, tingling or new weaknesses (+) "chemo brain" (+)  distibular migraine Behavioral/Psych: Mood is stable, no new changes  (+) anxiety  Musculoskeletal: (+) back pain  All other systems were reviewed with the patient and are negative.   MEDICAL HISTORY:  Past Medical History:  Diagnosis Date  . Allergy    SEASONAL  . Anemia    HIGH SCHOOL  . Atopic eczema   . cholangio ca dx'd 07/12/16  . Fibrocystic breast disease   . GERD (gastroesophageal reflux disease)   . Hx of migraines    seasonal   . Personal history of chemotherapy   . Pneumonia    6 years ago   . TIA (transient ischemic attack)     SURGICAL HISTORY: Past Surgical History:  Procedure Laterality Date  . EUS N/A 07/07/2016   Procedure: UPPER ENDOSCOPIC ULTRASOUND (EUS) RADIAL;  Surgeon: [name], MD;  Location: [facility] ENDOSCOPY;  Service: Endoscopy;  Laterality: N/A;  . IR GENERIC HISTORICAL  08/03/2016   IR US GUIDE VASC ACCESS RIGHT 08/03/2016 Corrie Mckusick, DO [facility]INTER[facility]  . IR GENERIC HISTORICAL  08/03/2016   IR FLUORO GUIDE PORT INSERTION RIGHT 08/03/2016  Corrie Mckusick, DO [facility]  . MYOMECTOMY  2002    SOCIAL HISTORY: Social History   Socioeconomic History  . Marital status: Married    Spouse name: Not on file  . Number of children: 0  . Years of education: 38  . Highest education level: Not on file  Social Needs  . Financial resource strain: Not on file  . Food insecurity - worry: Not on file  . Food insecurity - inability: Not on file  . Transportation needs - medical: Not on file  . Transportation needs - non-medical: Not on file  Occupational History  . Occupation: HR fml[employer]    Employer: [employer]    Employer: [employer]  Tobacco Use  . Smoking status: Never Smoker  . Smokeless tobacco: Never Used  Substance and Sexual Activity  . Alcohol use: Yes    Comment: maybe 3 times a year  . Drug use: No  . Sexual activity: Yes    Partners: Male  Other Topics Concern  .  Not on file  Social History Narrative  . Not on file    FAMILY HISTORY: Family History  Problem Relation Age of Onset  . Gout Mother   . Diabetes Mother   . Hypertension Father   . Diabetes Father   . Breast cancer Paternal Aunt   . Cancer Maternal Grandfather        throat cancer   . Cancer Cousin 51       GI cancer   . Thyroid disease Neg Hx     ALLERGIES:  is allergic to penicillins; contrast media [iodinated diagnostic agents]; and lipitor [atorvastatin].  MEDICATIONS:  Current Outpatient Medications  Medication Sig Dispense Refill  . ALPRAZolam (XANAX) 0.5 MG tablet Take 1 tablet (0.5 mg total) by mouth 3 (three) times daily as needed for anxiety. 30 tablet 0  . aspirin 81 MG tablet Take 81 mg by mouth daily.    . cetirizine (ZYRTEC) 10 MG tablet Take 10 mg by mouth daily as needed for allergies. For allergies     . Cholecalciferol (VITAMIN D3) 5000 UNITS CAPS Take 5,000 Units by mouth daily.     . COLOSTRUM PO Take 1.25 g by mouth 2 (two) times daily.     Marland Kitchen dicyclomine (BENTYL) 20 MG tablet Take 20 mg by mouth 3 (three) times daily as  needed for spasms.     Marland Kitchen lidocaine-prilocaine (EMLA) cream Apply 1 application topically as needed. Apply to portacath 1 1/2 hours - 2 hours prior to procedures as needed. 30 g 1  . LORazepam (ATIVAN) 0.5 MG tablet Place under tongue and dissolve every 8 hours as needed for nausea. Do not take Xanax while taking this medication. 30 tablet 0  . magnesium hydroxide (MILK OF MAGNESIA) 400 MG/5ML suspension Take 30 mLs by mouth daily as needed for mild constipation.    . megestrol (MEGACE) 400 MG/10ML suspension Take 10 mLs (400 mg total) by mouth daily. 240 mL 0  . Menaquinone-7 (VITAMIN K2) 40 MCG TABS Take 40 mg by mouth daily.     . mometasone (ELOCON) 0.1 % lotion Apply topically daily. (Patient taking differently: Apply 1 application topically daily as needed (itching). ) 60 mL 0  . OLANZapine (ZYPREXA) 2.5 MG tablet Take 1 tablet (2.5 mg total) by mouth at bedtime. 30 tablet 1  . omeprazole (PRILOSEC) 40 MG capsule Take 1 capsule (40 mg total) by mouth 2 (two) times daily before a meal. 60 capsule 1  . ondansetron (ZOFRAN) 8 MG tablet Take 1 tablet (8 mg total) by mouth every 8 (eight) hours as needed for nausea or vomiting. 30 tablet 3  . oxyCODONE (OXYCONTIN) 60 MG 12 hr tablet Take 60 mg by mouth every 12 (twelve) hours. 30 tablet 0  . Oxycodone HCl 10 MG TABS Take 1 tablet (10 mg total) by mouth every 3 (three) hours as needed. 100 tablet 0  . scopolamine (TRANSDERM-SCOP) 1 MG/3DAYS Place 1 patch (1.5 mg total) every 3 (three) days onto the skin. 10 patch 2  . senna (SENOKOT) 8.6 MG TABS tablet Take 2 tablets (17.2 mg total) by mouth 2 (two) times daily. 120 each 0  . capecitabine (XELODA) 500 MG tablet Take 3 tablets (1,500 mg total) by mouth 2 (two) times daily after a meal. Take for 7 days on, 7 days off, repeat (Patient not taking: Reported on 08/16/2017) 84 tablet 1  . dronabinol (MARINOL) 5 MG capsule Take 1 capsule (5 mg total) by mouth 2 (two) times daily before  a meal. (Patient not  taking: Reported on 08/16/2017) 60 capsule 1  . polyethylene glycol (MIRALAX / GLYCOLAX) packet Take 17 g by mouth 2 (two) times daily. Hold if you develop diarrhea (Patient not taking: Reported on 08/16/2017) 100 each 0  . promethazine (PHENERGAN) 25 MG tablet Take 1 tablet (25 mg total) by mouth every 6 (six) hours as needed for nausea or vomiting. (Patient not taking: Reported on 08/16/2017) 40 tablet 0   No current facility-administered medications for this visit.    Facility-Administered Medications Ordered in Other Visits  Medication Dose Route Frequency Provider Last Rate Last Dose  . alteplase (CATHFLO ACTIVASE) injection 2 mg  2 mg Intracatheter Once PRN Truitt Merle, MD      . heparin lock flush 100 unit/mL  250 Units Intracatheter Once PRN Truitt Merle, MD      . heparin lock flush 100 unit/mL  500 Units Intracatheter Once PRN Truitt Merle, MD      . ondansetron [facility]) injection 8 mg  8 mg Intravenous Once Truitt Merle, MD      . oxaliplatin (ELOXATIN) 65 mg in dextrose 5 % 500 mL chemo infusion  40 mg/m2 (Treatment Plan Recorded) Intravenous Once Truitt Merle, MD 257 mL/hr at 08/16/17 1244 65 mg at 08/16/17 1244  . sodium chloride flush (NS) 0.9 % injection 10 mL  10 mL Intracatheter PRN Truitt Merle, MD   10 mL at 09/09/16 1645  . sodium chloride flush (NS) 0.9 % injection 10 mL  10 mL Intravenous PRN Truitt Merle, MD   10 mL at 02/11/17 1515  . sodium chloride flush (NS) 0.9 % injection 10 mL  10 mL Intracatheter PRN Truitt Merle, MD       PHYSICAL EXAMINATION:  ECOG PERFORMANCE STATUS: 2 Vitals:   08/16/17 0930  BP: 135/82  Pulse: 91  Resp: 20  Temp: 98.7 F (37.1 C)  TempSrc: Oral  SpO2: 100%  Weight: 125 lb 12.8 oz (57.1 kg)    GENERAL:alert, no distress and comfortable SKIN: skin color, texture, turgor are normal, no rashes or significant lesions, several hematomas on extremities  EYES: normal, conjunctiva are pink and non-injected, sclera clear OROPHARYNX:no exudate, no erythema and  lips, buccal mucosa (+) oral thrush  NECK: supple, thyroid normal size, non-tender, without nodularity LYMPH:  no palpable lymphadenopathy in the cervical, axillary or inguinal LUNGS: clear to auscultation and percussion with normal breathing effort HEART: regular rate & rhythm and no murmurs and no lower extremity edema ABDOMEN:abdomen soft, non-tender and active bowel sounds, no hepatomegaly  Musculoskeletal:no cyanosis of digits and no clubbing  PSYCH: alert & oriented x 3 with fluent speech NEURO: no focal motor/sensory deficits  LABORATORY DATA:  I have reviewed the data as listed CBC Latest Ref Rng & Units 08/16/2017 08/10/2017 08/09/2017  WBC 3.9 - 10.3 10e3/uL 3.6(L) 4.2 3.9(L)  Hemoglobin 11.6 - 15.9 g/dL 10.0(L) 10.1(L) 10.2(L)  Hematocrit 34.8 - 46.6 % 30.5(L) 31.1(L) 31.1(L)  Platelets 145 - 400 10e3/uL 251 229 234   CMP Latest Ref Rng & Units 08/16/2017 08/10/2017 08/09/2017  Glucose 70 - 140 mg/dl 104 108(H) 109(H)  BUN 7.0 - 26.0 mg/dL 11.0 7 <5(L)  Creatinine 0.6 - 1.1 mg/dL 1.0 0.57 0.43(L)  Sodium 136 - 145 mEq/L 142 141 139  Potassium 3.5 - 5.1 mEq/L 3.6 4.2 3.6  Chloride 101 - 111 mmol/L - 111 106  CO2 22 - 29 mEq/L 27 24 23   Calcium 8.4 - 10.4 mg/dL 9.2 9.2 8.8(L)  Total  Protein 6.4 - 8.3 g/dL 6.7 6.4(L) 6.5  Total Bilirubin 0.20 - 1.20 mg/dL 0.26 0.5 1.0  Alkaline Phos 40 - 150 U/L 36(L) 33(L) 33(L)  AST 5 - 34 U/L 14 20 25   ALT 0 - 55 U/L 10 13(L) 16    CA19.9 (0-35U/ml) 07/28/2016: 36 08/18/2016: 51 09/13/2016: 56 10/19/2016: 43 11/09/2016: 44 12/06/2016: 41 01/05/2017: 35 02/02/2017: 37 03/07/17: 43 04/18/17: 66 05/16/17: 111 06/08/17: 119 06/18/17: 107 07/18/17: 161 08/16/17: PENDING    CA 125 07/25/17: 100.6    PATHOLOGY    Diagnosis 08/05/17  Peritoneum, biopsy, left lower abdomen METASTATIC ADENOCARCINOMA, CONSISTENT WITH CHOLANGIOCARCINOMA. Microscopic Comment The adenocarcinoma stains positive for ck7, negative for ER, GATA 3, ck20,  cdx2, and TTF-1. Pax 8 (patchy stain). The morphology and immunostain pattern support the above diagnosis. The block B is sent to [facility] for further molecular testing.   Diagnosis 07/07/2016 FINE NEEDLE ASPIRATION, ENDOSCOPIC, LIVER (SPECIMEN 2 OF 2 COLLECTED 07/07/16): MALIGNANT CELLS CONSISTENT WITH ADENOCARCINOMA.  ADDITIONAL INFORMATION: Per request, immunohistochemistry was performed. The cells are positive for cytokeratin 7 and negative for cytokeratin 20 and CDX-2. This excludes a colorectal primary. Pancreaticobiliary and upper GI sources remain in the differential. Called to Dr. Burr Medico on 07/28/2016.  Diagnosis 07/07/2016 FINE NEEDLE ASPIRATION, ENDOSCOPIC, GASTRO-HEPATIC LYMPH NODE (SPECIMEN 1 OF 2 COLLECTED 07/07/16): MALIGNANT CELLS CONSISTENT WITH ADENOCARCINOMA, SEE COMMENT. Preliminary Diagnosis Intraoperative Diagnosis: Few atypical glandular clusters - recommend additional material. 4) Adequate ([facility])   PROCEDURES  Upper EUS 07/07/2016 Dr. Ardis Hughs  - Endoscopically normal UGI tract (good views with standard gastroscope) - Vaguely bordered 4cm mass in the caudate lobe of liver that is confluent with what appear to be enlarged gastrohepatic lymphnodes (these may represent direct tumor extension however). Preliminary cytology from the lymphnodes shows malignancy, glandular. Preliminary cytology from the liver mass shows the same. Await final cytology results but this is most suspicious for peripheral intrahepatic cholangiocarcinoma with adjacent malignant adenopathy.  COLONOSCOPY 05/22/2014 ENDOSCOPIC IMPRESSION: 1. Sessile polyp measuring 4 mm in size was found in the sigmoid colon; polypectomy was performed with a cold snare 2. The colon mucosa was otherwise normal    RADIOGRAPHIC STUDIES: I have personally reviewed the radiological images as listed and agreed with the findings in the report.   CT CAP W Contrast 07/21/17 IMPRESSION: CT CHEST 1. Interval  development of 2 small new pulmonary nodules concerning for progressive, or new metastatic disease. Recommend continued attention on follow-up imaging. 2. The previously noted right upper and left upper lobe pulmonary nodules remain stable in size consistent with treated metastatic disease. 3. Stable position of right IJ approach power injectable port catheter. 4. Coronary artery calcifications. CT ABD/PELVIS 1. Marked interval enlargement of a complex, enhancing, cystic and solid left ovarian mass which now fills the posterior aspect of the pelvis exerting mass effect on the adjacent rectosigmoid colon and bladder. This likely accounts for the patient's lower abdominal pain and sensation of constipation (there is no significant stool burden within the rectum or sigmoid colon). These findings are highly suspicious for a primary ovarian cystic or mucinous adenocarcinoma. 2. Enlarging omental implants in the left lower quadrant highly concerning for metastatic ovarian carcinoma. 3. New/enlarging subcapsular hepatic lesion may represent progressive cholangio or ovarian carcinoma metastases. 4. Enlarging caudate lobe lesion and aortocaval/portacaval confluent necrotic adenopathy concerning for progressive metastatic cholangiocarcinoma. 5. The segment 6 hepatic lesion remains stable or may be smaller on today's examination. These results will be called to the ordering clinician or representative  by the Radiologist Assistant, and communication documented in the PACS or zVision Dashboard.   CT CAP W Contrast, 04/13/17 IMPRESSION: 1. The dominant caudate lobe mass is stable in size and appearance, as is the indistinct adenopathy in the porta hepatis adjacent to the caudate lobe, and in the retroperitoneum. Several subtle additional liver lesions are slightly less conspicuous on today' s exam. The pulmonary nodules are stable, and mild omental nodularity in the left lower quadrant is  very slightly worsened compared to prior. Overall the burden of malignancy appears similar to prior, and some of the findings may represent effectively treated metastatic lesions. 2. Complex left adnexal mass, 6.7 by 4.1 cm, enlarging. Pelvic sonography recommended for further characterization. 3. Other imaging findings of potential clinical significance: Aortic Atherosclerosis (ICD10-I70.0). Coronary atherosclerosis. Prominent stool throughout the colon favors constipation. Fibroid uterus.  CT CAP W Contrast 01/25/17 IMPRESSION: 1. Response to therapy. 2. Similar to minimal decrease in pulmonary nodules/metastasis. 3. Improvement in hepatic disease burden. 4. Decrease in abdominal adenopathy. 5. Significantly age advanced atherosclerosis, including within the coronary arteries. 6. Uterine fibroids. 7. Suspect a complex cyst in the left ovary. Recommend attention on follow-up.  CT CAP W CONTRAST 10/18/16 IMPRESSION: 1. Again noted are multifocal liver metastasis. Overall there has been no significant interval change in overall volume of liver metastases. 2. Similar appearance of upper abdominal adenopathy 3. Slight decrease in size of small pulmonary metastases.   ASSESSMENT & PLAN:  51 y.o. African-American female, without significant past medical history, presented with abdominal pain  1. Metastatic intrahepatic cholangiocarcinoma, with probable node and lung metastasis, [id number], stage IV -I previously reviewed her CT, MRI, endoscopy findings and her biopsy results with patient and her family members in details. -We have previously reviewed her case in our GI tumor board. Based on the scans and endoscopy findings, this is most consistent with cholangiocarcinoma. Her liver and inguinal biopsy showed adenocarcinoma, IHC was consistent with pancreatic-biliary primary -She has 2 additional small liver lesions and 2 4-47mm lung lesions, which are indeterminate but that is suspicious  for metastatic disease. -She was seen by surgeon Dr. Barry Dienes who thinks she is a not a candidate for surgical resection due to her metastatic disease. -We previously discussed her cancer is incurable at this stage, the goal of therapy is palliation, to prolong life and improve her quality life -she is on first-line cisplatin and gemcitabine, on day 1, 8 every 21 days.  -She has been tolerating chemotherapy moderately well, but had significant neutropenia and thrombocytopenia, cycle 2 chemotherapy was dose reduced -I previously reviewed her restaging CT scan from 10/19/2016, which showed stable disease overall, no other new lesions. She is clinically doing well, her abdominal pain has much improved. -CT from 5/29 reviewed with pt and it shows excellent partial response to treatment, both liver lesions and adenopathy have decreased in size, small lung nodules are stable, no new lesions.  -I previously reviewed her CT scan from 04/13/2017, which showed a stable disease overall. Her tumor marker CA 19.9 has been slowly trending up, concerning for early resistance. -She has developed prolonged neutropenia, secondary to chemotherapy.  -She started with low dose FOLFOX on 05/16/17, due to her neutropenia. Held oxaliplatin with cycle two due to poor toleration.  -Her CT CAP from 07/21/17 showed new peritoneal carcinomatosis with a large complex ovarian mass, primary ovarian cancer vs metastatic cholangiocarcinoma. I recommend IR biopsy of her peritoneal lesion, checked CA125 and it was 100.6 on 07/25/17.  -Her IR biopsy of  LLQ omental implant from 08/05/17 showed metastatic disease consistent with cholangiocarcinoma. Will discuss with Dr. Leamon Arnt at [facility] for consideration of other options or clinical trial.  -I recommend CAPOX for treatment. Xeloda 1500mg  BID for 1 week on and 1 week off with low dose oxaliplatin. If she does with I may increase to 60-70% chemo. She will start oxaliplatin today and Xeloda tonight.    -I reviewed the possible side effects she may experience. She knows to contact the clinic for me or symptom management clinic if she develops severe or unexpected side effects.  -F/u in 2 weeks     2. Abdominal pain, back pain  --- She has had worsening abdominal pain lately due to cancer progression, now much better controlled  -Continue OxyContin 60 mg every 12 hours, and oxycodone 10 mg every 3 hours as needed for pain  -She also has small amount of Dilaudid at home, she will use it as needed for severe pain  -she takes Xanax about 3 times a week now. Refilled on 08/16/17   3. Nausea and anorexia and weight loss  -We previously reviewed her nausea management, she will continue Compazine and Zofran -I previously encouraged her to take a nutritional supplement -We previously discussed appetite stimulant, she is on Marinol, dose was increased to 5 mg twice daily last week, appetite improved, will continue. -she will f/u with dietician  -She has tried Marinol, now on Megace, appetite has improved since her pain is controlled -Continue Megace -Nausea controlled with scopolamine patch, PPI, and zofran   4. Neutropenia and anemia  -Secondary to chemotherapy, neutropenia much improved with neulasta. -She is taking pre-natal vitamins that contain B-12 which is helpful -Continue monitoring  -She has persistent neutropenia from chemotherapy, we'll continue GCSF after chemo.  -Her Hg is stable at 10.0 and her WBC is 3.6, ANC normal at 1.6 today (08/16/17)  5. Goal of care discussion  -We previously discussed the incurable nature of her cancer, and the overall poor prognosis, especially she has had a major disease progression lately, and she has had several minor treatment.  Her prognosis is guarded -The patient understands the goal of care is palliative. -I have previously recommended DNR/DNI, she will think about it.   6. Constipation -I discussed this can worsen with increase in pain  medication   -I suggest she can take 4-6 tablets of seneca a day. If not enough she can take half bottle magnesium citrate until she has a BM. She can take seneca at least 2 tablets daily to make sure she has regular BM -She will continue senokot with or without miralax daily to maintain BM q1-2 days. She will take mag citrate if no BM in 4 days  7. Peripheral Neuropathy, grade 1 -to her feet, likely secondary to chemotherapy.    Plan -Refilled oxycodone and Xanax today  -Start IV oxaliplatin today at low dose 40mg /m2 and Xeloda 1500mg  bid tonight, for 7 days on and 7 days off  -ivf 2hr on 12/22 -Lab, flush, f/u and chemo oxaliplatin 1/2, or 1/3 or 1/4 and 2 weeks after    All questions were answered. The patient knows to call the clinic with any problems, questions or concerns.  I spent 25 minutes counseling the patient face to face. The total time spent in the appointment was 30 minutes and more than 50% was on counseling.  This document serves as a record of services personally performed by Truitt Merle, MD. It was created on her behalf  by Joslyn Devon, a trained medical scribe. The creation of this record is based on the scribe's personal observations and the provider's statements to them.    I have reviewed the above documentation for accuracy and completeness, and I agree with the above.   Truitt Merle, MD 08/16/2017

## 2017-08-15 NOTE — Telephone Encounter (Signed)
Oral Chemotherapy Pharmacist Encounter   I spoke with patient for overview of: Xeloda.   Pt is doing well. Counseled patient on administration, dosing, side effects, monitoring, drug-food interactions, safe handling, storage, and disposal.  Patient will take Xeloda 500mg  tablets, 3 tablets (1500mg ) by mouth in AM and 3 tabs (1500mg ) by mouth in PM, within 30 minutes of finishing meals, on days 1-7 and 15-21 of each 28 day cycle.  Oxaliplatin will be infused on day 1 and day 15 of each 28 day cycle. Xeloda start date: 08/16/17  Side effects of Xeloda include but not limited to: fatigue, decreased blood counts, GI upset, diarrhea, and hand-foot syndrome. Patient has loperamide at home and will call the office if diarrhea develops.    Reviewed with patient importance of keeping a medication schedule and plan for any missed doses.  Nancy [name] voiced understanding and appreciation.   All questions answered. Medication reconciliation performed and medication/allergy list updated.  Patient will pick up her Xeloda tablets from the [facility] tomorrow (08/16/17) prior to [facility] appointments and oxaliplatin infusion. Copayment $0 at this time.  Patient understands prescription may have to be filled at outside pharmacy after the first of the year depending on copayment changes and insurance requirements Science writer).   Patient knows to call the office with questions or concerns. Oral Oncology Clinic will continue to follow.  Thank you,  Johny Drilling, PharmD, BCPS, BCOP 08/15/2017   10:39 AM Oral Oncology Clinic [phone]

## 2017-08-16 ENCOUNTER — Telehealth: Payer: Self-pay | Admitting: Hematology

## 2017-08-16 ENCOUNTER — Ambulatory Visit (HOSPITAL_BASED_OUTPATIENT_CLINIC_OR_DEPARTMENT_OTHER): Payer: Federal, State, Local not specified - PPO | Admitting: Hematology

## 2017-08-16 ENCOUNTER — Encounter: Payer: Self-pay | Admitting: Hematology

## 2017-08-16 ENCOUNTER — Other Ambulatory Visit (HOSPITAL_BASED_OUTPATIENT_CLINIC_OR_DEPARTMENT_OTHER): Payer: Federal, State, Local not specified - PPO

## 2017-08-16 ENCOUNTER — Ambulatory Visit (HOSPITAL_BASED_OUTPATIENT_CLINIC_OR_DEPARTMENT_OTHER): Payer: Federal, State, Local not specified - PPO

## 2017-08-16 ENCOUNTER — Ambulatory Visit: Payer: Federal, State, Local not specified - PPO

## 2017-08-16 VITALS — BP 135/82 | HR 91 | Temp 98.7°F | Resp 20 | Wt 125.8 lb

## 2017-08-16 DIAGNOSIS — R911 Solitary pulmonary nodule: Secondary | ICD-10-CM

## 2017-08-16 DIAGNOSIS — C7800 Secondary malignant neoplasm of unspecified lung: Principal | ICD-10-CM

## 2017-08-16 DIAGNOSIS — D6481 Anemia due to antineoplastic chemotherapy: Secondary | ICD-10-CM

## 2017-08-16 DIAGNOSIS — R109 Unspecified abdominal pain: Secondary | ICD-10-CM

## 2017-08-16 DIAGNOSIS — C7801 Secondary malignant neoplasm of right lung: Principal | ICD-10-CM

## 2017-08-16 DIAGNOSIS — G629 Polyneuropathy, unspecified: Secondary | ICD-10-CM | POA: Diagnosis not present

## 2017-08-16 DIAGNOSIS — D701 Agranulocytosis secondary to cancer chemotherapy: Secondary | ICD-10-CM | POA: Diagnosis not present

## 2017-08-16 DIAGNOSIS — C221 Intrahepatic bile duct carcinoma: Secondary | ICD-10-CM

## 2017-08-16 DIAGNOSIS — N839 Noninflammatory disorder of ovary, fallopian tube and broad ligament, unspecified: Secondary | ICD-10-CM | POA: Diagnosis not present

## 2017-08-16 DIAGNOSIS — T451X5A Adverse effect of antineoplastic and immunosuppressive drugs, initial encounter: Secondary | ICD-10-CM

## 2017-08-16 DIAGNOSIS — M545 Low back pain: Secondary | ICD-10-CM | POA: Diagnosis not present

## 2017-08-16 DIAGNOSIS — C786 Secondary malignant neoplasm of retroperitoneum and peritoneum: Secondary | ICD-10-CM

## 2017-08-16 DIAGNOSIS — Z5111 Encounter for antineoplastic chemotherapy: Secondary | ICD-10-CM

## 2017-08-16 DIAGNOSIS — Z7189 Other specified counseling: Secondary | ICD-10-CM | POA: Diagnosis not present

## 2017-08-16 LAB — CBC WITH DIFFERENTIAL/PLATELET
BASO%: 0.5 % (ref 0.0–2.0)
Basophils Absolute: 0 10*3/uL (ref 0.0–0.1)
EOS%: 2 % (ref 0.0–7.0)
Eosinophils Absolute: 0.1 10*3/uL (ref 0.0–0.5)
HCT: 30.5 % — ABNORMAL LOW (ref 34.8–46.6)
HGB: 10 g/dL — ABNORMAL LOW (ref 11.6–15.9)
LYMPH%: 44.9 % (ref 14.0–49.7)
MCH: 28.2 pg (ref 25.1–34.0)
MCHC: 32.8 g/dL (ref 31.5–36.0)
MCV: 86.2 fL (ref 79.5–101.0)
MONO#: 0.3 10*3/uL (ref 0.1–0.9)
MONO%: 7.4 % (ref 0.0–14.0)
NEUT#: 1.6 10*3/uL (ref 1.5–6.5)
NEUT%: 45.2 % (ref 38.4–76.8)
Platelets: 251 10*3/uL (ref 145–400)
RBC: 3.54 10*6/uL — ABNORMAL LOW (ref 3.70–5.45)
RDW: 15.5 % — ABNORMAL HIGH (ref 11.2–14.5)
WBC: 3.6 10*3/uL — ABNORMAL LOW (ref 3.9–10.3)
lymph#: 1.6 10*3/uL (ref 0.9–3.3)

## 2017-08-16 LAB — COMPREHENSIVE METABOLIC PANEL
ALT: 10 U/L (ref 0–55)
AST: 14 U/L (ref 5–34)
Albumin: 4 g/dL (ref 3.5–5.0)
Alkaline Phosphatase: 36 U/L — ABNORMAL LOW (ref 40–150)
Anion Gap: 9 mEq/L (ref 3–11)
BUN: 11 mg/dL (ref 7.0–26.0)
CO2: 27 mEq/L (ref 22–29)
Calcium: 9.2 mg/dL (ref 8.4–10.4)
Chloride: 106 mEq/L (ref 98–109)
Creatinine: 1 mg/dL (ref 0.6–1.1)
EGFR: >60 mL/min/{1.73_m2} (ref >60)
Glucose: 104 mg/dl (ref 70–140)
Potassium: 3.6 mEq/L (ref 3.5–5.1)
Sodium: 142 mEq/L (ref 136–145)
Total Bilirubin: 0.26 mg/dL (ref 0.20–1.20)
Total Protein: 6.7 g/dL (ref 6.4–8.3)

## 2017-08-16 MED ORDER — HEPARIN SOD (PORK) LOCK FLUSH 100 UNIT/ML IV SOLN
500.0000 [IU] | Freq: Once | INTRAVENOUS | Status: AC | PRN
  Administered 2017-08-16: 500 [IU]
  Filled 2017-08-16: qty 5

## 2017-08-16 MED ORDER — SODIUM CHLORIDE 0.9% FLUSH
10.0000 mL | INTRAVENOUS | Status: DC | PRN
  Administered 2017-08-16: 10 mL
  Filled 2017-08-16: qty 10

## 2017-08-16 MED ORDER — LIDOCAINE-PRILOCAINE 2.5-2.5 % EX CREA
TOPICAL_CREAM | CUTANEOUS | Status: AC
  Filled 2017-08-16: qty 5

## 2017-08-16 MED ORDER — OXALIPLATIN CHEMO INJECTION 100 MG/20ML
40.0000 mg/m2 | Freq: Once | INTRAVENOUS | Status: AC
  Administered 2017-08-16: 65 mg via INTRAVENOUS
  Filled 2017-08-16: qty 13

## 2017-08-16 MED ORDER — DEXTROSE 5 % IV SOLN
Freq: Once | INTRAVENOUS | Status: AC
  Administered 2017-08-16: 12:00:00 via INTRAVENOUS

## 2017-08-16 MED ORDER — PALONOSETRON HCL INJECTION 0.25 MG/5ML
0.2500 mg | Freq: Once | INTRAVENOUS | Status: AC
  Administered 2017-08-16: 0.25 mg via INTRAVENOUS

## 2017-08-16 MED ORDER — ALPRAZOLAM 0.5 MG PO TABS: 0.5000 mg | ORAL_TABLET | Freq: Three times a day (TID) | ORAL | 0 refills | Status: DC | PRN

## 2017-08-16 MED ORDER — LIDOCAINE-PRILOCAINE 2.5-2.5 % EX CREA
TOPICAL_CREAM | Freq: Once | CUTANEOUS | Status: AC
  Administered 2017-08-16: 10:00:00 via TOPICAL

## 2017-08-16 MED ORDER — SODIUM CHLORIDE 0.9 % IV SOLN
Freq: Once | INTRAVENOUS | Status: AC
  Administered 2017-08-16: 12:00:00 via INTRAVENOUS
  Filled 2017-08-16: qty 5

## 2017-08-16 MED ORDER — OXYCODONE HCL 10 MG PO TABS: 10.0000 mg | ORAL_TABLET | ORAL | 0 refills | Status: DC | PRN

## 2017-08-16 MED ORDER — PALONOSETRON HCL INJECTION 0.25 MG/5ML
INTRAVENOUS | Status: AC
  Filled 2017-08-16: qty 5

## 2017-08-16 NOTE — Patient Instructions (Addendum)
[facility] Discharge Instructions for Patients Receiving Chemotherapy  Today you received the following chemotherapy agents: Oxaliplatin (Eloxatin).  To help prevent nausea and vomiting after your treatment, we encourage you to take your nausea medication as prescribed.  If you develop nausea and vomiting that is not controlled by your nausea medication, call the clinic.   BELOW ARE SYMPTOMS THAT SHOULD BE REPORTED IMMEDIATELY:  *FEVER GREATER THAN 100.5 F  *CHILLS WITH OR WITHOUT FEVER  NAUSEA AND VOMITING THAT IS NOT CONTROLLED WITH YOUR NAUSEA MEDICATION  *UNUSUAL SHORTNESS OF BREATH  *UNUSUAL BRUISING OR BLEEDING  TENDERNESS IN MOUTH AND THROAT WITH OR WITHOUT PRESENCE OF ULCERS  *URINARY PROBLEMS  *BOWEL PROBLEMS  UNUSUAL RASH Items with * indicate a potential emergency and should be followed up as soon as possible.  Feel free to call the clinic should you have any questions or concerns. The clinic phone number is [phone].  Please show the [facility] at check-in to the Emergency Department and triage nurse.  Oxaliplatin Injection What is this medicine? OXALIPLATIN (ox AL i PLA tin) is a chemotherapy drug. It targets fast dividing cells, like cancer cells, and causes these cells to die. This medicine is used to treat cancers of the colon and rectum, and many other cancers. This medicine may be used for other purposes; ask your health care provider or pharmacist if you have questions. COMMON BRAND NAME(S): Eloxatin What should I tell my health care provider before I take this medicine? They need to know if you have any of these conditions: -kidney disease -an unusual or allergic reaction to oxaliplatin, other chemotherapy, other medicines, foods, dyes, or preservatives -pregnant or trying to get pregnant -breast-feeding How should I use this medicine? This drug is given as an infusion into a vein. It is administered in a hospital or clinic  by a specially trained health care professional. Talk to your pediatrician regarding the use of this medicine in children. Special care may be needed. Overdosage: If you think you have taken too much of this medicine contact a poison control center or emergency room at once. NOTE: This medicine is only for you. Do not share this medicine with others. What if I miss a dose? It is important not to miss a dose. Call your doctor or health care professional if you are unable to keep an appointment. What may interact with this medicine? -medicines to increase blood counts like filgrastim, pegfilgrastim, sargramostim -probenecid -some antibiotics like amikacin, gentamicin, neomycin, polymyxin B, streptomycin, tobramycin -zalcitabine Talk to your doctor or health care professional before taking any of these medicines: -acetaminophen -aspirin -ibuprofen -ketoprofen -naproxen This list may not describe all possible interactions. Give your health care provider a list of all the medicines, herbs, non-prescription drugs, or dietary supplements you use. Also tell them if you smoke, drink alcohol, or use illegal drugs. Some items may interact with your medicine. What should I watch for while using this medicine? Your condition will be monitored carefully while you are receiving this medicine. You will need important blood work done while you are taking this medicine. This medicine can make you more sensitive to cold. Do not drink cold drinks or use ice. Cover exposed skin before coming in contact with cold temperatures or cold objects. When out in cold weather wear warm clothing and cover your mouth and nose to warm the air that goes into your lungs. Tell your doctor if you get sensitive to the cold. This drug may  make you feel generally unwell. This is not uncommon, as chemotherapy can affect healthy cells as well as cancer cells. Report any side effects. Continue your course of treatment even though you feel  ill unless your doctor tells you to stop. In some cases, you may be given additional medicines to help with side effects. Follow all directions for their use. Call your doctor or health care professional for advice if you get a fever, chills or sore throat, or other symptoms of a cold or flu. Do not treat yourself. This drug decreases your body's ability to fight infections. Try to avoid being around people who are sick. This medicine may increase your risk to bruise or bleed. Call your doctor or health care professional if you notice any unusual bleeding. Be careful brushing and flossing your teeth or using a toothpick because you may get an infection or bleed more easily. If you have any dental work done, tell your dentist you are receiving this medicine. Avoid taking products that contain aspirin, acetaminophen, ibuprofen, naproxen, or ketoprofen unless instructed by your doctor. These medicines may hide a fever. Do not become pregnant while taking this medicine. Women should inform their doctor if they wish to become pregnant or think they might be pregnant. There is a potential for serious side effects to an unborn child. Talk to your health care professional or pharmacist for more information. Do not breast-feed an infant while taking this medicine. Call your doctor or health care professional if you get diarrhea. Do not treat yourself. What side effects may I notice from receiving this medicine? Side effects that you should report to your doctor or health care professional as soon as possible: -allergic reactions like skin rash, itching or hives, swelling of the face, lips, or tongue -low blood counts - This drug may decrease the number of white blood cells, red blood cells and platelets. You may be at increased risk for infections and bleeding. -signs of infection - fever or chills, cough, sore throat, pain or difficulty passing urine -signs of decreased platelets or bleeding - bruising,  pinpoint red spots on the skin, black, tarry stools, nosebleeds -signs of decreased red blood cells - unusually weak or tired, fainting spells, lightheadedness -breathing problems -chest pain, pressure -cough -diarrhea -jaw tightness -mouth sores -nausea and vomiting -pain, swelling, redness or irritation at the injection site -pain, tingling, numbness in the hands or feet -problems with balance, talking, walking -redness, blistering, peeling or loosening of the skin, including inside the mouth -trouble passing urine or change in the amount of urine Side effects that usually do not require medical attention (report to your doctor or health care professional if they continue or are bothersome): -changes in vision -constipation -hair loss -loss of appetite -metallic taste in the mouth or changes in taste -stomach pain This list may not describe all possible side effects. Call your doctor for medical advice about side effects. You may report side effects to FDA at 1-800-FDA-1088. Where should I keep my medicine? This drug is given in a hospital or clinic and will not be stored at home. NOTE: This sheet is a summary. It may not cover all possible information. If you have questions about this medicine, talk to your doctor, pharmacist, or health care provider.  2018 [employer]vier/Gold Standard (2008-03-12 17:22:47)

## 2017-08-16 NOTE — Telephone Encounter (Signed)
Gave avs and calendar for January waiting on 1/2

## 2017-08-17 LAB — CANCER ANTIGEN 19-9: CA 19-9: 272 U/mL — ABNORMAL HIGH (ref 0–35)

## 2017-08-19 ENCOUNTER — Telehealth: Payer: Self-pay | Admitting: *Deleted

## 2017-08-19 NOTE — Telephone Encounter (Signed)
Returned call after voicemails received.  "To report symptoms and request scheduling [facility] visit today or note my medical record for tomorrow's appointment for IVF's."  .     "I took a nap, feel much better.  Staying calm, non busy in bed.  Will eating brunch at 11:30, take first Xeloda dose and take second dose six hours later.  Symptoms after started Xeloda oral chemotherapy Tuesday.  Last BM on Tuesday was fine  Passed six, hard, small, walnut size balls this mornning.  Did not have to push hard.  The gassiness is decreasing.  .  Trying to drink water when I'm awake.  The megace is to stimulate appetite.  No longer feeling sensation of tingling or heat running up outer forearms.  No redness or patches on palms of hands.  I've had my flu vaccine, but felt minor flu symptoms.  Congestion developing in my chest.  Dull pressure sensation in my left chest.  Reminded me when I played outside as a child not wearing a coat.  No pain or pressure now.  No shortness of breath or sweating."   Encouraged to drink 64 oz water daily due to Xeloda side effect of diarrhea.  Call before 4:30 pm if symptoms return.  Report to ED if feeling ouy of sorts, short of breath, sweating, unable to do normal activities.  No further questions or complaints.

## 2017-08-20 ENCOUNTER — Ambulatory Visit (HOSPITAL_BASED_OUTPATIENT_CLINIC_OR_DEPARTMENT_OTHER): Payer: Federal, State, Local not specified - PPO

## 2017-08-20 VITALS — BP 131/80 | HR 98 | Temp 98.4°F | Resp 20

## 2017-08-20 DIAGNOSIS — C221 Intrahepatic bile duct carcinoma: Secondary | ICD-10-CM | POA: Diagnosis not present

## 2017-08-20 DIAGNOSIS — C7801 Secondary malignant neoplasm of right lung: Principal | ICD-10-CM

## 2017-08-20 MED ORDER — SODIUM CHLORIDE 0.9 % IV SOLN
1000.0000 mL | Freq: Once | INTRAVENOUS | Status: AC
  Administered 2017-08-20: 1000 mL via INTRAVENOUS

## 2017-08-20 MED ORDER — SODIUM CHLORIDE 0.9% FLUSH
10.0000 mL | INTRAVENOUS | Status: DC | PRN
  Administered 2017-08-20: 10 mL
  Filled 2017-08-20: qty 10

## 2017-08-20 MED ORDER — HEPARIN SOD (PORK) LOCK FLUSH 100 UNIT/ML IV SOLN
500.0000 [IU] | Freq: Once | INTRAVENOUS | Status: AC | PRN
  Administered 2017-08-20: 500 [IU]
  Filled 2017-08-20: qty 5

## 2017-08-20 NOTE — Progress Notes (Signed)
Patient presented with severe abdominal pain radiating to her upper back. Patient rates pain 7 out of 10 pain unrelieved with Oxycodone 60mg  and Oxycodone 10mg  both taken at 8:30am.  Patient brought in home prescriptions for review including Hydromorphone 2 mg tablet with directions to take one tablet every 4 hours as needed for pain. Reveiwed Dr Ernestina Penna last office noted and patient was advised to use this medication for break through pain. Patient was advised to use this for pain today. She is concerned about constipation side effects. Patient was provided a pill cutter to take half now and if pain management was not achieved she may take the other half in 45 minutes. Reinforced measures to take at home to help prevent constipation at home.   1125 reassess patient pain and patient is sleeping.

## 2017-08-20 NOTE — Patient Instructions (Signed)
Dehydration, Adult Dehydration is when there is not enough fluid or water in your body. This happens when you lose more fluids than you take in. Dehydration can range from mild to very bad. It should be treated right away to keep it from getting very bad. Symptoms of mild dehydration may include:  Thirst.  Dry lips.  Slightly dry mouth.  Dry, warm skin.  Dizziness. Symptoms of moderate dehydration may include:  Very dry mouth.  Muscle cramps.  Dark pee (urine). Pee may be the color of tea.  Your body making less pee.  Your eyes making fewer tears.  Heartbeat that is uneven or faster than normal (palpitations).  Headache.  Light-headedness, especially when you stand up from sitting.  Fainting (syncope). Symptoms of very bad dehydration may include:  Changes in skin, such as: ? Cold and clammy skin. ? Blotchy (mottled) or pale skin. ? Skin that does not quickly return to normal after being lightly pinched and let go (poor skin turgor).  Changes in body fluids, such as: ? Feeling very thirsty. ? Your eyes making fewer tears. ? Not sweating when body temperature is high, such as in hot weather. ? Your body making very little pee.  Changes in vital signs, such as: ? Weak pulse. ? Pulse that is more than 100 beats a minute when you are sitting still. ? Fast breathing. ? Low blood pressure.  Other changes, such as: ? Sunken eyes. ? Cold hands and feet. ? Confusion. ? Lack of energy (lethargy). ? Trouble waking up from sleep. ? Short-term weight loss. ? Unconsciousness. Follow these instructions at home:  If told by your doctor, drink an ORS: ? Make an ORS by using instructions on the package. ? Start by drinking small amounts, about  cup (120 mL) every 5-10 minutes. ? Slowly drink more until you have had the amount that your doctor said to have.  Drink enough clear fluid to keep your pee clear or pale yellow. If you were told to drink an ORS, finish the ORS  first, then start slowly drinking clear fluids. Drink fluids such as: ? Water. Do not drink only water by itself. Doing that can make the salt (sodium) level in your body get too low (hyponatremia). ? Ice chips. ? Fruit juice that you have added water to (diluted). ? Low-calorie sports drinks.  Avoid: ? Alcohol. ? Drinks that have a lot of sugar. These include high-calorie sports drinks, fruit juice that does not have water added, and soda. ? Caffeine. ? Foods that are greasy or have a lot of fat or sugar.  Take over-the-counter and prescription medicines only as told by your doctor.  Do not take salt tablets. Doing that can make the salt level in your body get too high (hypernatremia).  Eat foods that have minerals (electrolytes). Examples include bananas, oranges, potatoes, tomatoes, and spinach.  Keep all follow-up visits as told by your doctor. This is important. Contact a doctor if:  You have belly (abdominal) pain that: ? Gets worse. ? Stays in one area (localizes).  You have a rash.  You have a stiff neck.  You get angry or annoyed more easily than normal (irritability).  You are more sleepy than normal.  You have a harder time waking up than normal.  You feel: ? Weak. ? Dizzy. ? Very thirsty.  You have peed (urinated) only a small amount of very dark pee during 6-8 hours. Get help right away if:  You have symptoms of   very bad dehydration.  You cannot drink fluids without throwing up (vomiting).  Your symptoms get worse with treatment.  You have a fever.  You have a very bad headache.  You are throwing up or having watery poop (diarrhea) and it: ? Gets worse. ? Does not go away.  You have blood or something green (bile) in your throw-up.  You have blood in your poop (stool). This may cause poop to look black and tarry.  You have not peed in 6-8 hours.  You pass out (faint).  Your heart rate when you are sitting still is more than 100 beats a  minute.  You have trouble breathing. This information is not intended to replace advice given to you by your health care provider. Make sure you discuss any questions you have with your health care provider. Document Released: 06/12/2009 Document Revised: 03/05/2016 Document Reviewed: 10/10/2015 Elsevier Interactive Patient Education  2018 [employer]  

## 2017-08-21 ENCOUNTER — Emergency Department (HOSPITAL_BASED_OUTPATIENT_CLINIC_OR_DEPARTMENT_OTHER)
Admission: EM | Admit: 2017-08-21 | Discharge: 2017-08-21 | Disposition: A | Discharge status: Home / Self Care | Payer: Federal, State, Local not specified - PPO | Attending: Emergency Medicine | Admitting: Emergency Medicine

## 2017-08-21 ENCOUNTER — Other Ambulatory Visit: Payer: Self-pay

## 2017-08-21 ENCOUNTER — Encounter (HOSPITAL_BASED_OUTPATIENT_CLINIC_OR_DEPARTMENT_OTHER): Payer: Self-pay | Admitting: *Deleted

## 2017-08-21 ENCOUNTER — Emergency Department (HOSPITAL_BASED_OUTPATIENT_CLINIC_OR_DEPARTMENT_OTHER): Payer: Federal, State, Local not specified - PPO

## 2017-08-21 DIAGNOSIS — K5903 Drug induced constipation: Secondary | ICD-10-CM | POA: Insufficient documentation

## 2017-08-21 DIAGNOSIS — C221 Intrahepatic bile duct carcinoma: Secondary | ICD-10-CM

## 2017-08-21 DIAGNOSIS — Z8673 Personal history of transient ischemic attack (TIA), and cerebral infarction without residual deficits: Secondary | ICD-10-CM | POA: Diagnosis not present

## 2017-08-21 DIAGNOSIS — Z79899 Other long term (current) drug therapy: Secondary | ICD-10-CM | POA: Insufficient documentation

## 2017-08-21 DIAGNOSIS — G893 Neoplasm related pain (acute) (chronic): Secondary | ICD-10-CM | POA: Diagnosis not present

## 2017-08-21 DIAGNOSIS — R1013 Epigastric pain: Secondary | ICD-10-CM | POA: Diagnosis not present

## 2017-08-21 DIAGNOSIS — Z7982 Long term (current) use of aspirin: Secondary | ICD-10-CM | POA: Diagnosis not present

## 2017-08-21 DIAGNOSIS — K5900 Constipation, unspecified: Secondary | ICD-10-CM | POA: Diagnosis present

## 2017-08-21 LAB — COMPREHENSIVE METABOLIC PANEL
ALT: 10 U/L — ABNORMAL LOW (ref 14–54)
AST: 21 U/L (ref 15–41)
Albumin: 3.6 g/dL (ref 3.5–5.0)
Alkaline Phosphatase: 24 U/L — ABNORMAL LOW (ref 38–126)
Anion gap: 7 (ref 5–15)
BUN: 12 mg/dL (ref 6–20)
CO2: 24 mmol/L (ref 22–32)
Calcium: 8.6 mg/dL — ABNORMAL LOW (ref 8.9–10.3)
Chloride: 104 mmol/L (ref 101–111)
Creatinine, Ser: 0.59 mg/dL (ref 0.44–1.00)
GFR calc Af Amer: >60 mL/min (ref >60)
GFR calc non Af Amer: >60 mL/min (ref >60)
Glucose, Bld: 104 mg/dL — ABNORMAL HIGH (ref 65–99)
Potassium: 3.4 mmol/L — ABNORMAL LOW (ref 3.5–5.1)
Sodium: 135 mmol/L (ref 135–145)
Total Bilirubin: 0.5 mg/dL (ref 0.3–1.2)
Total Protein: 5.9 g/dL — ABNORMAL LOW (ref 6.5–8.1)

## 2017-08-21 LAB — URINALYSIS, ROUTINE W REFLEX MICROSCOPIC
Bilirubin Urine: NEGATIVE
Glucose, UA: NEGATIVE mg/dL
Hgb urine dipstick: NEGATIVE
Ketones, ur: 15 mg/dL — AB
Leukocytes, UA: NEGATIVE
Nitrite: NEGATIVE
Protein, ur: NEGATIVE mg/dL
Specific Gravity, Urine: 1.01 (ref 1.005–1.030)
pH: 7.5 (ref 5.0–8.0)

## 2017-08-21 LAB — CBC WITH DIFFERENTIAL/PLATELET
Basophils Absolute: 0 10*3/uL (ref 0.0–0.1)
Basophils Relative: 0 %
Eosinophils Absolute: 0 10*3/uL (ref 0.0–0.7)
Eosinophils Relative: 1 %
HCT: 29.2 % — ABNORMAL LOW (ref 36.0–46.0)
Hemoglobin: 9.6 g/dL — ABNORMAL LOW (ref 12.0–15.0)
Lymphocytes Relative: 40 %
Lymphs Abs: 1.9 10*3/uL (ref 0.7–4.0)
MCH: 28.2 pg (ref 26.0–34.0)
MCHC: 32.9 g/dL (ref 30.0–36.0)
MCV: 85.6 fL (ref 78.0–100.0)
Monocytes Absolute: 0.3 10*3/uL (ref 0.1–1.0)
Monocytes Relative: 6 %
Neutro Abs: 2.6 10*3/uL (ref 1.7–7.7)
Neutrophils Relative %: 53 %
Platelets: 224 10*3/uL (ref 150–400)
RBC: 3.41 MIL/uL — ABNORMAL LOW (ref 3.87–5.11)
RDW: 13.8 % (ref 11.5–15.5)
WBC: 4.8 10*3/uL (ref 4.0–10.5)

## 2017-08-21 LAB — LIPASE, BLOOD: Lipase: 22 U/L (ref 11–51)

## 2017-08-21 MED ORDER — ONDANSETRON HCL 4 MG/2ML IJ SOLN
4.0000 mg | Freq: Once | INTRAMUSCULAR | Status: AC
  Administered 2017-08-21: 4 mg via INTRAVENOUS
  Filled 2017-08-21: qty 2

## 2017-08-21 MED ORDER — SODIUM CHLORIDE 0.9 % IV SOLN
1000.0000 mL | Freq: Once | INTRAVENOUS | Status: AC
  Administered 2017-08-21: 1000 mL via INTRAVENOUS

## 2017-08-21 MED ORDER — HYDROMORPHONE HCL 1 MG/ML IJ SOLN
1.0000 mg | Freq: Once | INTRAMUSCULAR | Status: AC
  Administered 2017-08-21: 1 mg via INTRAVENOUS
  Filled 2017-08-21: qty 1

## 2017-08-21 NOTE — ED Triage Notes (Addendum)
Patient states she developed mid bilateral back pain and upper abdominal pain.  Describes the pain as constant and bloated and heavy in the upper abdomin. Pain is associated with nausea and constipation.  States she woke up this morning with tingling in the bottoms of her feet.  This patient is a cancer patient, received chemo this week and was started on a new chemo pill, Xeloda, this week. States she also took Miralax x 1 last night.

## 2017-08-21 NOTE — ED Provider Notes (Signed)
[facility] EMERGENCY DEPARTMENT Provider Note   CSN: [id number] Arrival date & time: 08/21/17  0818     History   Chief Complaint Chief Complaint  Patient presents with  . Abdominal Pain    HPI Nancy [name] is a 51 y.o. female.  Pt is an unfortunate 28 you female who has metastatic intrahepatic cholangiocarcinoma stage IV.  The pt has had problems with chronic epigastric pain from her cancer.  She is followed by Dr. Burr Medico who last saw her on 12/18.  She has is getting Xeloda and oxaliplatin for chemo.  Last dose yesterday.  Dr. Burr Medico has her on oxycontin 60 mg every 12 hrs and oxycodone 10 every 3 hours.  She also has prn dilaudid at home.  She has had problems with constipation and feels like she may be constipated.  She did have a small bm yesterday.  She took miralax last night and today without help.  Pt also feels burning and tingling to her feet.      Past Medical History:  Diagnosis Date  . Allergy    SEASONAL  . Anemia    H[employer]CHOOL  . Atopic eczema   . cholangio ca dx'd 07/12/16  . Fibrocystic breast disease   . GERD (gastroesophageal reflux disease)   . Hx of migraines    seasonal   . Personal history of chemotherapy   . Pneumonia    6 years ago   . TIA (transient ischemic attack)     Patient Active Problem List   Diagnosis Date Noted  . Moderate protein-calorie malnutrition ([place])   . Nausea & vomiting 08/09/2017  . Abdominal pain 08/09/2017  . Nausea and vomiting 08/09/2017  . Hypokalemia   . Acute neoplasm-related pain 07/29/2017  . Abdominal pain, acute, left upper quadrant 07/29/2017  . Dehydration 07/29/2017  . Oral candidiasis 07/19/2017  . Neoplasm related pain 07/19/2017  . Anemia due to antineoplastic chemotherapy 02/13/2017  . Goals of care, counseling/discussion 10/04/2016  . Cerumen impaction 09/16/2016  . Abnormal finding on GI tract imaging   . Metastatic cholangiocarcinoma to lung ([place])   . Acute bronchitis  10/01/2015  . Vestibular migraine 06/20/2015  . TIA (transient ischemic attack) 06/12/2015  . Hyperlipidemia LDL goal <70 06/12/2015  . Diplopia 06/09/2015  . Vertigo 06/09/2015  . Right leg pain 04/12/2014  . Hyperthyroidism 03/13/2014  . MAMMOGRAM, ABNORMAL 10/31/2008  . ECZEMA, ATOPIC 10/15/2008  . FIBROCYSTIC BREAST DISEASE, HX OF 10/15/2008    Past Surgical History:  Procedure Laterality Date  . EUS N/A 07/07/2016   Procedure: UPPER ENDOSCOPIC ULTRASOUND (EUS) RADIAL;  Surgeon: [name], MD;  Location: [facility];  Service: Endoscopy;  Laterality: N/A;  . IR GENERIC HISTORICAL  08/03/2016   IR US GUIDE VASC ACCESS RIGHT 08/03/2016 Corrie Mckusick, DO [facility]  . IR GENERIC HISTORICAL  08/03/2016   IR FLUORO GUIDE PORT INSERTION RIGHT 08/03/2016 Corrie Mckusick, DO [facility]  . MYOMECTOMY  2002    OB History    No data available       Home Medications    Prior to Admission medications   Medication Sig Start Date End Date Taking? Authorizing Provider  ALPRAZolam Duanne Moron) 0.5 MG tablet Take 1 tablet (0.5 mg total) by mouth 3 (three) times daily as needed for anxiety. 08/16/17   Truitt Merle, MD  aspirin 81 MG tablet Take 81 mg by mouth daily.    [provider]  capecitabine (XELODA) 500 MG tablet Take 3 tablets (  1,500 mg total) by mouth 2 (two) times daily after a meal. Take for 7 days on, 7 days off, repeat Patient not taking: Reported on 08/16/2017 08/12/17   Truitt Merle, MD  cetirizine (ZYRTEC) 10 MG tablet Take 10 mg by mouth daily as needed for allergies. For allergies     [provider]  Cholecalciferol (VITAMIN D3) 5000 UNITS CAPS Take 5,000 Units by mouth daily.     [provider]  COLOSTRUM PO Take 1.25 g by mouth 2 (two) times daily.     [provider]  dicyclomine (BENTYL) 20 MG tablet Take 20 mg by mouth 3 (three) times daily as needed for spasms.  07/22/16   [provider]  dronabinol (MARINOL) 5 MG capsule  Take 1 capsule (5 mg total) by mouth 2 (two) times daily before a meal. Patient not taking: Reported on 08/16/2017 06/08/17   Owens Shark, NP  lidocaine-prilocaine (EMLA) cream Apply 1 application topically as needed. Apply to portacath 1 1/2 hours - 2 hours prior to procedures as needed. 08/12/17   Truitt Merle, MD  LORazepam (ATIVAN) 0.5 MG tablet Place under tongue and dissolve every 8 hours as needed for nausea. Do not take Xanax while taking this medication. 05/18/17   Tanner, Lyndon Code., PA-C  magnesium hydroxide (MILK OF MAGNESIA) 400 MG/5ML suspension Take 30 mLs by mouth daily as needed for mild constipation.    [provider]  megestrol (MEGACE) 400 MG/10ML suspension Take 10 mLs (400 mg total) by mouth daily. 08/02/17   Eugenie Filler, MD  Menaquinone-7 (VITAMIN K2) 40 MCG TABS Take 40 mg by mouth daily.     [provider]  mometasone (ELOCON) 0.1 % lotion Apply topically daily. Patient taking differently: Apply 1 application topically daily as needed (itching).  08/21/15   Roma Schanz R, DO  OLANZapine (ZYPREXA) 2.5 MG tablet Take 1 tablet (2.5 mg total) by mouth at bedtime. 07/20/17   Tanner, Lyndon Code., PA-C  omeprazole (PRILOSEC) 40 MG capsule Take 1 capsule (40 mg total) by mouth 2 (two) times daily before a meal. 08/01/17   Eugenie Filler, MD  ondansetron (ZOFRAN) 8 MG tablet Take 1 tablet (8 mg total) by mouth every 8 (eight) hours as needed for nausea or vomiting. 07/25/17   Alla Feeling, NP  oxyCODONE (OXYCONTIN) 60 MG 12 hr tablet Take 60 mg by mouth every 12 (twelve) hours. 08/10/17   Barton Dubois, MD  Oxycodone HCl 10 MG TABS Take 1 tablet (10 mg total) by mouth every 3 (three) hours as needed. 08/16/17   Truitt Merle, MD  polyethylene glycol [facility] / Floria Raveling) packet Take 17 g by mouth 2 (two) times daily. Hold if you develop diarrhea Patient not taking: Reported on 08/16/2017 08/01/17   Eugenie Filler, MD  promethazine (PHENERGAN) 25 MG tablet  Take 1 tablet (25 mg total) by mouth every 6 (six) hours as needed for nausea or vomiting. Patient not taking: Reported on 08/16/2017 05/18/17   Harle Stanford., PA-C  scopolamine (TRANSDERM-SCOP) 1 MG/3DAYS Place 1 patch (1.5 mg total) every 3 (three) days onto the skin. 07/13/17   Tanner, Lyndon Code., PA-C  senna (SENOKOT) 8.6 MG TABS tablet Take 2 tablets (17.2 mg total) by mouth 2 (two) times daily. 08/01/17   Eugenie Filler, MD    Family History Family History  Problem Relation Age of Onset  . Gout Mother   . Diabetes Mother   . Hypertension Father   .  Diabetes Father   . Breast cancer Paternal Aunt   . Cancer Maternal Grandfather        throat cancer   . Cancer Cousin 54       GI cancer   . Thyroid disease Neg Hx     Social History Social History   Tobacco Use  . Smoking status: Never Smoker  . Smokeless tobacco: Never Used  Substance Use Topics  . Alcohol use: Yes    Comment: maybe 3 times a year  . Drug use: No     Allergies   Penicillins; Contrast media [iodinated diagnostic agents]; and Lipitor [atorvastatin]   Review of Systems Review of Systems  Gastrointestinal: Positive for abdominal pain and constipation.  All other systems reviewed and are negative.    Physical Exam Updated Vital Signs BP 127/87 (BP Location: Left Arm)   Pulse 78   Temp 97.8 F (36.6 C) (Oral)   Resp 16   Ht 5\' 6"  (1.676 m)   Wt 56.7 kg (125 lb)   SpO2 100%   BMI 20.18 kg/m   Physical Exam  Constitutional: She is oriented to person, place, and time. She appears well-developed and well-nourished.  HENT:  Head: Normocephalic and atraumatic.  Mouth/Throat: Oropharynx is clear and moist.  Eyes: EOM are normal. Pupils are equal, round, and reactive to light.  Cardiovascular: Normal rate, regular rhythm, normal heart sounds and intact distal pulses.  Pulmonary/Chest: Effort normal and breath sounds normal.  Abdominal: Soft. Normal appearance and normal aorta. Bowel sounds are  decreased. There is generalized tenderness.  Neurological: She is alert and oriented to person, place, and time.  Skin: Skin is warm. Capillary refill takes less than 2 seconds.  No rash or redness to palms or soles of feet  Psychiatric: She has a normal mood and affect. Her behavior is normal.  Nursing note and vitals reviewed.    ED Treatments / Results  Labs (all labs ordered are listed, but only abnormal results are displayed) Labs Reviewed  CBC WITH DIFFERENTIAL/PLATELET - Abnormal; Notable for the following components:      Result Value   RBC 3.41 (*)    Hemoglobin 9.6 (*)    HCT 29.2 (*)    All other components within normal limits  COMPREHENSIVE METABOLIC PANEL - Abnormal; Notable for the following components:   Potassium 3.4 (*)    Glucose, Bld 104 (*)    Calcium 8.6 (*)    Total Protein 5.9 (*)    ALT 10 (*)    Alkaline Phosphatase 24 (*)    All other components within normal limits  URINALYSIS, ROUTINE W REFLEX MICROSCOPIC - Abnormal; Notable for the following components:   Ketones, ur 15 (*)    All other components within normal limits  LIPASE, BLOOD    EKG  EKG Interpretation None       Radiology Dg Abdomen Acute W/chest  Result Date: 08/21/2017 CLINICAL DATA:  Mid chest and upper back pain since yesterday. Patient with a history of metastatic cholangiocarcinoma. EXAM: DG ABDOMEN ACUTE W/ 1V CHEST COMPARISON:  Two views of the abdomen 05/18/2017. CT chest, abdomen and pelvis 07/20/2017. FINDINGS: Single-view of the chest demonstrates clear lungs and normal heart size. No pneumothorax or pleural effusion. Port-A-Cath is in place. Two views of the abdomen show no free intraperitoneal air. The bowel gas pattern is nonobstructive. No abnormal abdominal calcification or focal bony abnormality. IMPRESSION: No acute finding chest or abdomen. Electronically Signed   By: Inge Rise  M.D.   On: 08/21/2017 10:42    Procedures Procedures (including critical care  time)  Medications Ordered in ED Medications  0.9 %  sodium chloride infusion (1,000 mLs Intravenous New Bag/Given 08/21/17 0915)  HYDROmorphone (DILAUDID) injection 1 mg (1 mg Intravenous Given 08/21/17 0921)  ondansetron (ZOFRAN) injection 4 mg (4 mg Intravenous Given 08/21/17 3244)     Initial Impression / Assessment and Plan / ED Course  I have reviewed the triage vital signs and the nursing notes.  Pertinent labs & imaging results that were available during my care of the patient were reviewed by me and considered in my medical decision making (see chart for details).    Pt had a soap suds enema and had a large bm.  She is feeling much better.  She is stable for d/c.  Final Clinical Impressions(s) / ED Diagnoses   Final diagnoses:  Drug-induced constipation  Cholangiocarcinoma [facility])    ED Discharge Orders    None       Isla Pence, MD 08/21/17 1251

## 2017-08-22 ENCOUNTER — Telehealth: Payer: Self-pay | Admitting: Hematology

## 2017-08-22 NOTE — Telephone Encounter (Signed)
Called regarding 1/2

## 2017-08-24 ENCOUNTER — Telehealth: Payer: Self-pay | Admitting: *Deleted

## 2017-08-24 MED ORDER — OXYCODONE HCL 10 MG PO TABS: 10.0000 mg | ORAL_TABLET | ORAL | 0 refills | Status: DC | PRN

## 2017-08-24 MED ORDER — OXYCODONE HCL ER 60 MG PO T12A: 60.0000 mg | EXTENDED_RELEASE_TABLET | Freq: Two times a day (BID) | ORAL | 0 refills | Status: DC

## 2017-08-24 NOTE — Telephone Encounter (Signed)
Received vm call from pt asking for refill on her oxycontin & oxycodone.  She reports have 1 left of each.  Called pt & she wants to pick up in am, stating that she doesn't have the stamina to come today.  Will get scripts ready.

## 2017-08-29 ENCOUNTER — Telehealth: Payer: Self-pay | Admitting: Hematology

## 2017-08-29 NOTE — Telephone Encounter (Signed)
Left voicemail for patient regarding appts that may need to be rescheduled due to 12/29 sch msg.

## 2017-08-30 ENCOUNTER — Other Ambulatory Visit: Payer: Self-pay

## 2017-08-30 ENCOUNTER — Encounter (HOSPITAL_BASED_OUTPATIENT_CLINIC_OR_DEPARTMENT_OTHER): Payer: Self-pay | Admitting: Emergency Medicine

## 2017-08-30 ENCOUNTER — Emergency Department (HOSPITAL_BASED_OUTPATIENT_CLINIC_OR_DEPARTMENT_OTHER)
Admission: EM | Admit: 2017-08-30 | Discharge: 2017-08-31 | Disposition: A | Discharge status: Home / Self Care | Payer: Federal, State, Local not specified - PPO | Attending: Emergency Medicine | Admitting: Emergency Medicine

## 2017-08-30 ENCOUNTER — Emergency Department (HOSPITAL_BASED_OUTPATIENT_CLINIC_OR_DEPARTMENT_OTHER): Payer: Federal, State, Local not specified - PPO

## 2017-08-30 DIAGNOSIS — Z9221 Personal history of antineoplastic chemotherapy: Secondary | ICD-10-CM | POA: Diagnosis not present

## 2017-08-30 DIAGNOSIS — Z7982 Long term (current) use of aspirin: Secondary | ICD-10-CM | POA: Insufficient documentation

## 2017-08-30 DIAGNOSIS — R109 Unspecified abdominal pain: Secondary | ICD-10-CM | POA: Insufficient documentation

## 2017-08-30 DIAGNOSIS — C3490 Malignant neoplasm of unspecified part of unspecified bronchus or lung: Secondary | ICD-10-CM | POA: Diagnosis not present

## 2017-08-30 DIAGNOSIS — Z8673 Personal history of transient ischemic attack (TIA), and cerebral infarction without residual deficits: Secondary | ICD-10-CM | POA: Diagnosis not present

## 2017-08-30 DIAGNOSIS — R112 Nausea with vomiting, unspecified: Secondary | ICD-10-CM | POA: Insufficient documentation

## 2017-08-30 DIAGNOSIS — R300 Dysuria: Secondary | ICD-10-CM | POA: Diagnosis not present

## 2017-08-30 DIAGNOSIS — G8929 Other chronic pain: Secondary | ICD-10-CM | POA: Insufficient documentation

## 2017-08-30 LAB — CBC WITH DIFFERENTIAL/PLATELET
Basophils Absolute: 0 10*3/uL (ref 0.0–0.1)
Basophils Relative: 0 %
Eosinophils Absolute: 0 10*3/uL (ref 0.0–0.7)
Eosinophils Relative: 0 %
HCT: 28.9 % — ABNORMAL LOW (ref 36.0–46.0)
Hemoglobin: 9.4 g/dL — ABNORMAL LOW (ref 12.0–15.0)
Lymphocytes Relative: 24 %
Lymphs Abs: 0.9 10*3/uL (ref 0.7–4.0)
MCH: 28.3 pg (ref 26.0–34.0)
MCHC: 32.5 g/dL (ref 30.0–36.0)
MCV: 87 fL (ref 78.0–100.0)
Monocytes Absolute: 0.2 10*3/uL (ref 0.1–1.0)
Monocytes Relative: 5 %
Neutro Abs: 2.6 10*3/uL (ref 1.7–7.7)
Neutrophils Relative %: 71 %
Platelets: 193 10*3/uL (ref 150–400)
RBC: 3.32 MIL/uL — ABNORMAL LOW (ref 3.87–5.11)
RDW: 14.5 % (ref 11.5–15.5)
WBC: 3.6 10*3/uL — ABNORMAL LOW (ref 4.0–10.5)

## 2017-08-30 LAB — URINALYSIS, ROUTINE W REFLEX MICROSCOPIC
Bilirubin Urine: NEGATIVE
Glucose, UA: NEGATIVE mg/dL
Hgb urine dipstick: NEGATIVE
Ketones, ur: 40 mg/dL — AB
Leukocytes, UA: NEGATIVE
Nitrite: NEGATIVE
Protein, ur: NEGATIVE mg/dL
Specific Gravity, Urine: 1.02 (ref 1.005–1.030)
pH: 6 (ref 5.0–8.0)

## 2017-08-30 LAB — COMPREHENSIVE METABOLIC PANEL
ALT: 12 U/L — ABNORMAL LOW (ref 14–54)
AST: 22 U/L (ref 15–41)
Albumin: 3.8 g/dL (ref 3.5–5.0)
Alkaline Phosphatase: 26 U/L — ABNORMAL LOW (ref 38–126)
Anion gap: 12 (ref 5–15)
BUN: 13 mg/dL (ref 6–20)
CO2: 22 mmol/L (ref 22–32)
Calcium: 8.9 mg/dL (ref 8.9–10.3)
Chloride: 103 mmol/L (ref 101–111)
Creatinine, Ser: 0.78 mg/dL (ref 0.44–1.00)
GFR calc Af Amer: >60 mL/min (ref >60)
GFR calc non Af Amer: >60 mL/min (ref >60)
Glucose, Bld: 89 mg/dL (ref 65–99)
Potassium: 3.8 mmol/L (ref 3.5–5.1)
Sodium: 137 mmol/L (ref 135–145)
Total Bilirubin: 0.8 mg/dL (ref 0.3–1.2)
Total Protein: 6.6 g/dL (ref 6.5–8.1)

## 2017-08-30 LAB — LIPASE, BLOOD: Lipase: 26 U/L (ref 11–51)

## 2017-08-30 MED ORDER — OXYCODONE HCL 5 MG PO TABS
10.0000 mg | ORAL_TABLET | Freq: Once | ORAL | Status: AC
  Administered 2017-08-30: 10 mg via ORAL
  Filled 2017-08-30: qty 2

## 2017-08-30 MED ORDER — SODIUM CHLORIDE 0.9 % IV BOLUS (SEPSIS)
1000.0000 mL | Freq: Once | INTRAVENOUS | Status: AC
  Administered 2017-08-30: 1000 mL via INTRAVENOUS

## 2017-08-30 MED ORDER — HEPARIN SOD (PORK) LOCK FLUSH 100 UNIT/ML IV SOLN
INTRAVENOUS | Status: AC
  Administered 2017-08-30: 500 [IU]
  Filled 2017-08-30: qty 5

## 2017-08-30 MED ORDER — HYDROMORPHONE HCL 1 MG/ML IJ SOLN
1.0000 mg | Freq: Once | INTRAMUSCULAR | Status: AC
  Administered 2017-08-30: 1 mg via INTRAVENOUS
  Filled 2017-08-30: qty 1

## 2017-08-30 MED ORDER — ONDANSETRON HCL 4 MG/2ML IJ SOLN
INTRAMUSCULAR | Status: AC
  Administered 2017-08-30: 4 mg
  Filled 2017-08-30: qty 2

## 2017-08-30 NOTE — ED Notes (Signed)
Returned from [employer]. States her pain is getting worse.

## 2017-08-30 NOTE — ED Triage Notes (Signed)
PT presents with c/o abdominal pain and vomiting all day today. PT is a current chemo patient. PT took oxy and dilaudid at home around 6pm.

## 2017-08-30 NOTE — ED Notes (Signed)
Pt and family given d/c papers; pt states she can move yet, wants to wait till meds kick in. Pt moaning and thrashing in pain

## 2017-08-30 NOTE — ED Provider Notes (Signed)
[facility] EMERGENCY DEPARTMENT Provider Note   CSN: [id number] Arrival date & time: 08/30/17  1916     History   Chief Complaint Chief Complaint  Patient presents with  . Abdominal Pain  . Emesis    HPI Nancy [name] is a 52 y.o. female.  HPI  52 year old female with a history of cholangiocarcinoma presents with severe abdominal pain and vomiting.  History is taken from significant other at the bedside because the patient is writhing in pain.  She started chemotherapy back in June or July 2018 and has been having this type of abdominal pain on and off since.  Has been vomiting all day today.  No fevers but did have a temperature of 99.  She has had some dysuria.  She has a chronic history of constipation related to opiates but had a soft bowel movement at 5 PM tonight.  She has not had any abdominal distention.  This abdominal pain is like many previous exacerbations of her abdominal pain.  There are no new features tonight.  She took her oral pain medicines without relief.  Past Medical History:  Diagnosis Date  . Allergy    SEASONAL  . Anemia    HIGH SCHOOL  . Atopic eczema   . cholangio ca dx'd 07/12/16  . Fibrocystic breast disease   . GERD (gastroesophageal reflux disease)   . Hx of migraines    seasonal   . Personal history of chemotherapy   . Pneumonia    6 years ago   . TIA (transient ischemic attack)     Patient Active Problem List   Diagnosis Date Noted  . Moderate protein-calorie malnutrition ([place])   . Nausea & vomiting 08/09/2017  . Abdominal pain 08/09/2017  . Nausea and vomiting 08/09/2017  . Hypokalemia   . Acute neoplasm-related pain 07/29/2017  . Abdominal pain, acute, left upper quadrant 07/29/2017  . Dehydration 07/29/2017  . Oral candidiasis 07/19/2017  . Neoplasm related pain 07/19/2017  . Anemia due to antineoplastic chemotherapy 02/13/2017  . Goals of care, counseling/discussion 10/04/2016  . Cerumen impaction 09/16/2016  .  Abnormal finding on GI tract imaging   . Metastatic cholangiocarcinoma to lung ([place])   . Acute bronchitis 10/01/2015  . Vestibular migraine 06/20/2015  . TIA (transient ischemic attack) 06/12/2015  . Hyperlipidemia LDL goal <70 06/12/2015  . Diplopia 06/09/2015  . Vertigo 06/09/2015  . Right leg pain 04/12/2014  . Hyperthyroidism 03/13/2014  . MAMMOGRAM, ABNORMAL 10/31/2008  . ECZEMA, ATOPIC 10/15/2008  . FIBROCYSTIC BREAST DISEASE, HX OF 10/15/2008    Past Surgical History:  Procedure Laterality Date  . EUS N/A 07/07/2016   Procedure: UPPER ENDOSCOPIC ULTRASOUND (EUS) RADIAL;  Surgeon: [name], MD;  Location: [facility];  Service: Endoscopy;  Laterality: N/A;  . IR GENERIC HISTORICAL  08/03/2016   IR US GUIDE VASC ACCESS RIGHT 08/03/2016 Corrie Mckusick, DO [facility]  . IR GENERIC HISTORICAL  08/03/2016   IR FLUORO GUIDE PORT INSERTION RIGHT 08/03/2016 Corrie Mckusick, DO [facility]  . MYOMECTOMY  2002    OB History    No data available       Home Medications    Prior to Admission medications   Medication Sig Start Date End Date Taking? Authorizing Provider  ALPRAZolam Duanne Moron) 0.5 MG tablet Take 1 tablet (0.5 mg total) by mouth 3 (three) times daily as needed for anxiety. 08/16/17   Truitt Merle, MD  aspirin 81 MG tablet Take 81 mg by mouth daily.  [provider]  capecitabine (XELODA) 500 MG tablet Take 3 tablets (1,500 mg total) by mouth 2 (two) times daily after a meal. Take for 7 days on, 7 days off, repeat Patient not taking: Reported on 08/16/2017 08/12/17   Truitt Merle, MD  cetirizine (ZYRTEC) 10 MG tablet Take 10 mg by mouth daily as needed for allergies. For allergies     [provider]  Cholecalciferol (VITAMIN D3) 5000 UNITS CAPS Take 5,000 Units by mouth daily.     [provider]  COLOSTRUM PO Take 1.25 g by mouth 2 (two) times daily.     [provider]  dicyclomine (BENTYL) 20 MG tablet Take 20 mg by mouth 3 (three)  times daily as needed for spasms.  07/22/16   [provider]  dronabinol (MARINOL) 5 MG capsule Take 1 capsule (5 mg total) by mouth 2 (two) times daily before a meal. Patient not taking: Reported on 08/16/2017 06/08/17   Owens Shark, NP  lidocaine-prilocaine (EMLA) cream Apply 1 application topically as needed. Apply to portacath 1 1/2 hours - 2 hours prior to procedures as needed. 08/12/17   Truitt Merle, MD  LORazepam (ATIVAN) 0.5 MG tablet Place under tongue and dissolve every 8 hours as needed for nausea. Do not take Xanax while taking this medication. 05/18/17   Tanner, Lyndon Code., PA-C  magnesium hydroxide (MILK OF MAGNESIA) 400 MG/5ML suspension Take 30 mLs by mouth daily as needed for mild constipation.    [provider]  megestrol (MEGACE) 400 MG/10ML suspension Take 10 mLs (400 mg total) by mouth daily. 08/02/17   Eugenie Filler, MD  Menaquinone-7 (VITAMIN K2) 40 MCG TABS Take 40 mg by mouth daily.     [provider]  mometasone (ELOCON) 0.1 % lotion Apply topically daily. Patient taking differently: Apply 1 application topically daily as needed (itching).  08/21/15   Roma Schanz R, DO  OLANZapine (ZYPREXA) 2.5 MG tablet Take 1 tablet (2.5 mg total) by mouth at bedtime. 07/20/17   Tanner, Lyndon Code., PA-C  omeprazole (PRILOSEC) 40 MG capsule Take 1 capsule (40 mg total) by mouth 2 (two) times daily before a meal. 08/01/17   Eugenie Filler, MD  ondansetron (ZOFRAN) 8 MG tablet Take 1 tablet (8 mg total) by mouth every 8 (eight) hours as needed for nausea or vomiting. 07/25/17   Alla Feeling, NP  oxyCODONE (OXYCONTIN) 60 MG 12 hr tablet Take 60 mg by mouth every 12 (twelve) hours. 08/24/17   Tanner, Lyndon Code., PA-C  Oxycodone HCl 10 MG TABS Take 1 tablet (10 mg total) by mouth every 3 (three) hours as needed. 08/24/17   Tanner, Lyndon Code., PA-C  polyethylene glycol (MIRALAX / GLYCOLAX) packet Take 17 g by mouth 2 (two) times daily. Hold if you develop  diarrhea Patient not taking: Reported on 08/16/2017 08/01/17   Eugenie Filler, MD  promethazine (PHENERGAN) 25 MG tablet Take 1 tablet (25 mg total) by mouth every 6 (six) hours as needed for nausea or vomiting. Patient not taking: Reported on 08/16/2017 05/18/17   Harle Stanford., PA-C  scopolamine (TRANSDERM-SCOP) 1 MG/3DAYS Place 1 patch (1.5 mg total) every 3 (three) days onto the skin. 07/13/17   Tanner, Lyndon Code., PA-C  senna (SENOKOT) 8.6 MG TABS tablet Take 2 tablets (17.2 mg total) by mouth 2 (two) times daily. 08/01/17   Eugenie Filler, MD    Family History Family History  Problem Relation Age of Onset  .  Gout Mother   . Diabetes Mother   . Hypertension Father   . Diabetes Father   . Breast cancer Paternal Aunt   . Cancer Maternal Grandfather        throat cancer   . Cancer Cousin 67       GI cancer   . Thyroid disease Neg Hx     Social History Social History   Tobacco Use  . Smoking status: Never Smoker  . Smokeless tobacco: Never Used  Substance Use Topics  . Alcohol use: Yes    Comment: maybe 3 times a year  . Drug use: No     Allergies   Penicillins; Contrast media [iodinated diagnostic agents]; and Lipitor [atorvastatin]   Review of Systems Review of Systems  Constitutional: Negative for fever.  Respiratory: Negative for shortness of breath.   Cardiovascular: Negative for chest pain.  Gastrointestinal: Positive for abdominal pain, nausea and vomiting. Negative for constipation and diarrhea.  Genitourinary: Positive for dysuria.  All other systems reviewed and are negative.    Physical Exam Updated Vital Signs BP (!) 164/98 (BP Location: Left Arm)   Pulse (!) 109   Temp 99.1 F (37.3 C) (Oral)   Resp 20   LMP 05/20/2016   SpO2 100%   Physical Exam  Constitutional: She is oriented to person, place, and time. She appears well-developed and well-nourished.  Non-toxic appearance. She appears distressed.  Can't sit still, moves between laying,  turning on side and sitting up. Moaning in pain  HENT:  Head: Normocephalic and atraumatic.  Right Ear: External ear normal.  Left Ear: External ear normal.  Nose: Nose normal.  Eyes: Right eye exhibits no discharge. Left eye exhibits no discharge.  Cardiovascular: Regular rhythm and normal heart sounds. Tachycardia present.  Pulmonary/Chest: Effort normal and breath sounds normal.  Abdominal: Soft. She exhibits no distension. There is no tenderness.  Neurological: She is alert and oriented to person, place, and time.  Skin: Skin is warm and dry.  Nursing note and vitals reviewed.    ED Treatments / Results  Labs (all labs ordered are listed, but only abnormal results are displayed) Labs Reviewed  COMPREHENSIVE METABOLIC PANEL - Abnormal; Notable for the following components:      Result Value   ALT 12 (*)    Alkaline Phosphatase 26 (*)    All other components within normal limits  CBC WITH DIFFERENTIAL/PLATELET - Abnormal; Notable for the following components:   WBC 3.6 (*)    RBC 3.32 (*)    Hemoglobin 9.4 (*)    HCT 28.9 (*)    All other components within normal limits  URINALYSIS, ROUTINE W REFLEX MICROSCOPIC - Abnormal; Notable for the following components:   Ketones, ur 40 (*)    All other components within normal limits  LIPASE, BLOOD    EKG  EKG Interpretation None       Radiology Dg Abd Acute W/chest  Result Date: 08/30/2017 CLINICAL DATA:  Abdominal pain , Nausea, Vomiting. Chemo treatment x 2 weeks HX Colon CA. Husband signed pregnancy waiver due patient inability to sign due to pain meds given. EXAM: DG ABDOMEN ACUTE W/ 1V CHEST COMPARISON:  08/21/2017 FINDINGS: Power port type central venous catheter with tip over the low SVC region. No pneumothorax. No change in position. Shallow inspiration. Heart size and pulmonary vascularity are normal. Lungs are clear. No blunting of costophrenic angles. No pneumothorax. Gas and stool throughout the colon. No small or  large bowel distention. No free  intra-abdominal air. No abnormal air-fluid levels. No radiopaque stones. Small focal calcification in the pelvis probably represents a calcified fibroid. Soft tissue contours are unremarkable. Visualized bones appear intact. IMPRESSION: No evidence of active pulmonary disease. Normal nonobstructive bowel gas pattern. Electronically Signed   By: Lucienne Capers M.D.   On: 08/30/2017 22:09    Procedures Procedures (including critical care time)  Medications Ordered in ED Medications  ondansetron (ZOFRAN) 4 MG/2ML injection (4 mg  Given 08/30/17 2043)  sodium chloride 0.9 % bolus 1,000 mL (0 mLs Intravenous Stopped 08/30/17 2316)  HYDROmorphone (DILAUDID) injection 1 mg (1 mg Intravenous Given 08/30/17 2051)  HYDROmorphone (DILAUDID) injection 1 mg (1 mg Intravenous Given 08/30/17 2209)  oxyCODONE (Oxy IR/ROXICODONE) immediate release tablet 10 mg (10 mg Oral Given 08/30/17 2323)  heparin lock flush 100 UNIT/ML injection (500 Units  Given 08/30/17 2324)     Initial Impression / Assessment and Plan / ED Course  I have reviewed the triage vital signs and the nursing notes.  Pertinent labs & imaging results that were available during my care of the patient were reviewed by me and considered in my medical decision making (see chart for details).     Patient's pain is significantly improved after a second dose of IV Dilaudid.  Lab work is at baseline.  I do not think CT imaging would be beneficial as this pain has been an on and off issue for over 6 months.  No UTI or other acute infectious source apparent.  No obvious obstruction.  She had a bowel movement just a couple hours ago.  She has not had any vomiting in the ED.  Given pain is improved, will discharge home and have her follow-up with her oncologist tomorrow as scheduled for chemotherapy.  Discussed return precautions.  Final Clinical Impressions(s) / ED Diagnoses   Final diagnoses:  Chronic abdominal pain    ED  Discharge Orders    None       Sherwood Gambler, MD 08/30/17 2352

## 2017-08-30 NOTE — ED Notes (Signed)
Pt is unable to void. Informed pt once her pain is under control we have to attempt to go to the bathroom for a urine or I&O cath.

## 2017-08-31 ENCOUNTER — Encounter: Payer: Self-pay | Admitting: Hematology

## 2017-08-31 ENCOUNTER — Ambulatory Visit (HOSPITAL_BASED_OUTPATIENT_CLINIC_OR_DEPARTMENT_OTHER): Payer: Federal, State, Local not specified - PPO | Admitting: Hematology

## 2017-08-31 ENCOUNTER — Other Ambulatory Visit: Payer: Federal, State, Local not specified - PPO

## 2017-08-31 ENCOUNTER — Ambulatory Visit (HOSPITAL_BASED_OUTPATIENT_CLINIC_OR_DEPARTMENT_OTHER): Payer: Federal, State, Local not specified - PPO

## 2017-08-31 ENCOUNTER — Telehealth: Payer: Self-pay | Admitting: Hematology

## 2017-08-31 ENCOUNTER — Other Ambulatory Visit (HOSPITAL_BASED_OUTPATIENT_CLINIC_OR_DEPARTMENT_OTHER): Payer: Federal, State, Local not specified - PPO

## 2017-08-31 VITALS — BP 179/90 | HR 92 | Temp 99.3°F | Resp 18

## 2017-08-31 DIAGNOSIS — D6481 Anemia due to antineoplastic chemotherapy: Secondary | ICD-10-CM | POA: Diagnosis not present

## 2017-08-31 DIAGNOSIS — Z7189 Other specified counseling: Secondary | ICD-10-CM

## 2017-08-31 DIAGNOSIS — C786 Secondary malignant neoplasm of retroperitoneum and peritoneum: Secondary | ICD-10-CM

## 2017-08-31 DIAGNOSIS — D701 Agranulocytosis secondary to cancer chemotherapy: Secondary | ICD-10-CM

## 2017-08-31 DIAGNOSIS — C221 Intrahepatic bile duct carcinoma: Secondary | ICD-10-CM

## 2017-08-31 DIAGNOSIS — T451X5A Adverse effect of antineoplastic and immunosuppressive drugs, initial encounter: Secondary | ICD-10-CM

## 2017-08-31 DIAGNOSIS — C7801 Secondary malignant neoplasm of right lung: Principal | ICD-10-CM

## 2017-08-31 DIAGNOSIS — R5383 Other fatigue: Secondary | ICD-10-CM

## 2017-08-31 DIAGNOSIS — R634 Abnormal weight loss: Secondary | ICD-10-CM | POA: Diagnosis not present

## 2017-08-31 DIAGNOSIS — C7800 Secondary malignant neoplasm of unspecified lung: Principal | ICD-10-CM

## 2017-08-31 DIAGNOSIS — R112 Nausea with vomiting, unspecified: Secondary | ICD-10-CM | POA: Diagnosis not present

## 2017-08-31 DIAGNOSIS — R109 Unspecified abdominal pain: Secondary | ICD-10-CM

## 2017-08-31 DIAGNOSIS — G8929 Other chronic pain: Secondary | ICD-10-CM

## 2017-08-31 DIAGNOSIS — G620 Drug-induced polyneuropathy: Secondary | ICD-10-CM | POA: Diagnosis not present

## 2017-08-31 DIAGNOSIS — R911 Solitary pulmonary nodule: Secondary | ICD-10-CM

## 2017-08-31 DIAGNOSIS — R1031 Right lower quadrant pain: Secondary | ICD-10-CM

## 2017-08-31 LAB — COMPREHENSIVE METABOLIC PANEL
ALT: 13 U/L (ref 0–55)
AST: 20 U/L (ref 5–34)
Albumin: 4 g/dL (ref 3.5–5.0)
Alkaline Phosphatase: 32 U/L — ABNORMAL LOW (ref 40–150)
Anion Gap: 15 mEq/L — ABNORMAL HIGH (ref 3–11)
BUN: 11.4 mg/dL (ref 7.0–26.0)
CO2: 21 mEq/L — ABNORMAL LOW (ref 22–29)
Calcium: 9.3 mg/dL (ref 8.4–10.4)
Chloride: 101 mEq/L (ref 98–109)
Creatinine: 0.8 mg/dL (ref 0.6–1.1)
EGFR: >60 mL/min/{1.73_m2} (ref >60)
Glucose: 103 mg/dl (ref 70–140)
Potassium: 3.4 mEq/L — ABNORMAL LOW (ref 3.5–5.1)
Sodium: 137 mEq/L (ref 136–145)
Total Bilirubin: 0.77 mg/dL (ref 0.20–1.20)
Total Protein: 7.1 g/dL (ref 6.4–8.3)

## 2017-08-31 LAB — CBC WITH DIFFERENTIAL/PLATELET
BASO%: 0.3 % (ref 0.0–2.0)
Basophils Absolute: 0 10*3/uL (ref 0.0–0.1)
EOS%: 0 % (ref 0.0–7.0)
Eosinophils Absolute: 0 10*3/uL (ref 0.0–0.5)
HCT: 30 % — ABNORMAL LOW (ref 34.8–46.6)
HGB: 10 g/dL — ABNORMAL LOW (ref 11.6–15.9)
LYMPH%: 26.8 % (ref 14.0–49.7)
MCH: 28.8 pg (ref 25.1–34.0)
MCHC: 33.4 g/dL (ref 31.5–36.0)
MCV: 86.4 fL (ref 79.5–101.0)
MONO#: 0.2 10*3/uL (ref 0.1–0.9)
MONO%: 5.7 % (ref 0.0–14.0)
NEUT#: 2.6 10*3/uL (ref 1.5–6.5)
NEUT%: 67.2 % (ref 38.4–76.8)
Platelets: 198 10*3/uL (ref 145–400)
RBC: 3.47 10*6/uL — ABNORMAL LOW (ref 3.70–5.45)
RDW: 15.6 % — ABNORMAL HIGH (ref 11.2–14.5)
WBC: 3.8 10*3/uL — ABNORMAL LOW (ref 3.9–10.3)
lymph#: 1 10*3/uL (ref 0.9–3.3)

## 2017-08-31 MED ORDER — HEPARIN SOD (PORK) LOCK FLUSH 100 UNIT/ML IV SOLN
500.0000 [IU] | Freq: Once | INTRAVENOUS | Status: AC | PRN
  Administered 2017-08-31: 500 [IU]
  Filled 2017-08-31: qty 5

## 2017-08-31 MED ORDER — HYDROMORPHONE HCL 4 MG/ML IJ SOLN
1.0000 mg | Freq: Once | INTRAMUSCULAR | Status: AC
Start: 2017-08-31 — End: 2017-08-31
  Administered 2017-08-31: 1 mg via INTRAVENOUS
  Filled 2017-08-31: qty 1

## 2017-08-31 MED ORDER — HYDROMORPHONE HCL 4 MG/ML IJ SOLN
1.0000 mg | Freq: Once | INTRAMUSCULAR | Status: AC
  Administered 2017-08-31: 1 mg via INTRAVENOUS
  Filled 2017-08-31: qty 1

## 2017-08-31 MED ORDER — SODIUM CHLORIDE 0.9 % IV SOLN: Freq: Once | INTRAVENOUS | Status: DC

## 2017-08-31 MED ORDER — HYDROMORPHONE HCL 1 MG/ML IJ SOLN
1.0000 mg | Freq: Once | INTRAMUSCULAR | Status: AC
  Administered 2017-08-31: 1 mg via INTRAVENOUS

## 2017-08-31 MED ORDER — HYDROMORPHONE HCL 2 MG/ML IJ SOLN
INTRAMUSCULAR | Status: AC
  Filled 2017-08-31: qty 1

## 2017-08-31 MED ORDER — ONDANSETRON HCL 4 MG/2ML IJ SOLN
8.0000 mg | Freq: Once | INTRAMUSCULAR | Status: AC
  Administered 2017-08-31: 8 mg via INTRAVENOUS

## 2017-08-31 MED ORDER — DEXAMETHASONE SODIUM PHOSPHATE 10 MG/ML IJ SOLN
10.0000 mg | Freq: Once | INTRAMUSCULAR | Status: AC
  Administered 2017-08-31: 10 mg via INTRAVENOUS

## 2017-08-31 MED ORDER — SODIUM CHLORIDE 0.9 % IV SOLN
Freq: Once | INTRAVENOUS | Status: AC
  Administered 2017-08-31: 10:00:00 via INTRAVENOUS

## 2017-08-31 MED ORDER — DEXAMETHASONE SODIUM PHOSPHATE 10 MG/ML IJ SOLN
INTRAMUSCULAR | Status: AC
  Filled 2017-08-31: qty 1

## 2017-08-31 MED ORDER — HYDROMORPHONE HCL 1 MG/ML IJ SOLN
INTRAMUSCULAR | Status: AC
  Filled 2017-08-31: qty 1

## 2017-08-31 MED ORDER — ONDANSETRON HCL 4 MG/2ML IJ SOLN
INTRAMUSCULAR | Status: AC
  Filled 2017-08-31: qty 4

## 2017-08-31 MED ORDER — SODIUM CHLORIDE 0.9% FLUSH
10.0000 mL | INTRAVENOUS | Status: DC | PRN
  Administered 2017-08-31: 10 mL
  Filled 2017-08-31: qty 10

## 2017-08-31 MED ORDER — HYDROMORPHONE HCL 2 MG PO TABS: 2.0000 mg | ORAL_TABLET | ORAL | 0 refills | Status: DC | PRN

## 2017-08-31 NOTE — Patient Instructions (Signed)
Dehydration, Adult Dehydration is when there is not enough fluid or water in your body. This happens when you lose more fluids than you take in. Dehydration can range from mild to very bad. It should be treated right away to keep it from getting very bad. Symptoms of mild dehydration may include:  Thirst.  Dry lips.  Slightly dry mouth.  Dry, warm skin.  Dizziness. Symptoms of moderate dehydration may include:  Very dry mouth.  Muscle cramps.  Dark pee (urine). Pee may be the color of tea.  Your body making less pee.  Your eyes making fewer tears.  Heartbeat that is uneven or faster than normal (palpitations).  Headache.  Light-headedness, especially when you stand up from sitting.  Fainting (syncope). Symptoms of very bad dehydration may include:  Changes in skin, such as: ? Cold and clammy skin. ? Blotchy (mottled) or pale skin. ? Skin that does not quickly return to normal after being lightly pinched and let go (poor skin turgor).  Changes in body fluids, such as: ? Feeling very thirsty. ? Your eyes making fewer tears. ? Not sweating when body temperature is high, such as in hot weather. ? Your body making very little pee.  Changes in vital signs, such as: ? Weak pulse. ? Pulse that is more than 100 beats a minute when you are sitting still. ? Fast breathing. ? Low blood pressure.  Other changes, such as: ? Sunken eyes. ? Cold hands and feet. ? Confusion. ? Lack of energy (lethargy). ? Trouble waking up from sleep. ? Short-term weight loss. ? Unconsciousness. Follow these instructions at home:  If told by your doctor, drink an ORS: ? Make an ORS by using instructions on the package. ? Start by drinking small amounts, about  cup (120 mL) every 5-10 minutes. ? Slowly drink more until you have had the amount that your doctor said to have.  Drink enough clear fluid to keep your pee clear or pale yellow. If you were told to drink an ORS, finish the ORS  first, then start slowly drinking clear fluids. Drink fluids such as: ? Water. Do not drink only water by itself. Doing that can make the salt (sodium) level in your body get too low (hyponatremia). ? Ice chips. ? Fruit juice that you have added water to (diluted). ? Low-calorie sports drinks.  Avoid: ? Alcohol. ? Drinks that have a lot of sugar. These include high-calorie sports drinks, fruit juice that does not have water added, and soda. ? Caffeine. ? Foods that are greasy or have a lot of fat or sugar.  Take over-the-counter and prescription medicines only as told by your doctor.  Do not take salt tablets. Doing that can make the salt level in your body get too high (hypernatremia).  Eat foods that have minerals (electrolytes). Examples include bananas, oranges, potatoes, tomatoes, and spinach.  Keep all follow-up visits as told by your doctor. This is important. Contact a doctor if:  You have belly (abdominal) pain that: ? Gets worse. ? Stays in one area (localizes).  You have a rash.  You have a stiff neck.  You get angry or annoyed more easily than normal (irritability).  You are more sleepy than normal.  You have a harder time waking up than normal.  You feel: ? Weak. ? Dizzy. ? Very thirsty.  You have peed (urinated) only a small amount of very dark pee during 6-8 hours. Get help right away if:  You have symptoms of   very bad dehydration.  You cannot drink fluids without throwing up (vomiting).  Your symptoms get worse with treatment.  You have a fever.  You have a very bad headache.  You are throwing up or having watery poop (diarrhea) and it: ? Gets worse. ? Does not go away.  You have blood or something green (bile) in your throw-up.  You have blood in your poop (stool). This may cause poop to look black and tarry.  You have not peed in 6-8 hours.  You pass out (faint).  Your heart rate when you are sitting still is more than 100 beats a  minute.  You have trouble breathing. This information is not intended to replace advice given to you by your health care provider. Make sure you discuss any questions you have with your health care provider. Document Released: 06/12/2009 Document Revised: 03/05/2016 Document Reviewed: 10/10/2015 Elsevier Interactive Patient Education  2018 [employer]  

## 2017-08-31 NOTE — ED Notes (Signed)
Pt states isn't feeling good to leave yet. Husband at bedside, wheelchair at bedside

## 2017-08-31 NOTE — Telephone Encounter (Signed)
Gave patient avs and calendar with appts per 1/2 los.  °

## 2017-08-31 NOTE — Progress Notes (Signed)
Due to recent pain exacerbation.  No chemo today per Dr. Burr Medico, will reschedule to Saturday or Monday of next week.  1L NS, IV dilaudid, zofran today per Dr. Burr Medico.

## 2017-08-31 NOTE — Progress Notes (Signed)
[place]  [phone] [phone]  Clinic Follow Up Note   Patient Care Team: Carollee Herter, Alferd Apa, DO as PCP - General Pyrtle, Lajuan Lines, MD as Consulting Physician (Gastroenterology)   Date of Service:  08/31/2017  CHIEF COMPLAINTS:  Follow up metastatic intrahepatic cholangiocarcinoma  Oncology History   Metastatic cholangiocarcinoma to lung [facility])   Staging form: Perihilar Bile Ducts, AJCC 7th Edition   - Clinical stage from 07/07/2016: Stage IVB (T2b, N1, M1) - Signed by Truitt Merle, MD on 07/28/2016      Metastatic cholangiocarcinoma to lung ([place])   05/22/2014 Procedure    Routine screening colonoscopy showed a sessile polyp measuring 4 mm in the sigmoid colon, removed, otherwise negative.      06/29/2016 Imaging    CT chest, abdomen and pelvis showed 2 indeterminant nodule in left and the right lung, 4-47m, indeterminate a hypovascular liver lesions largest in the caudate lobe measuring 2.6 cm, mild abdominal lymphadenopathy in the gastrohepatic ligament and portocaval space.      07/02/2016 Imaging    Abdominal MRI with and without contrast showed 2 lobular lesions in the caudate lobe, and is regular lymph nodes in the gastrohepatic ligament which is adjacent to the liver lesion, suspicious for malignancy. 2 small lesions in the right hepatic lobe are indeterminate, but concerning for metastasis.      07/07/2016 Procedure    EGD was negative, EUS biopsy of the liver and adjacent lymph node      07/07/2016 Initial Diagnosis    Metastatic cholangiocarcinoma to lung ([place]      07/07/2016 Initial Biopsy    Fine-needle aspiration of the liver lesion in caudate lobe and adjacent lymph nodes both showed metastatic adenocarcinoma.      08/11/2016 - 04/25/2017 Chemotherapy    Cisplatin 25 mg/m, gemcitabine 1000 mg/m, on day one and 8, every 21 days   Cisplatin and gemcitabine on Day 1, 8 every 21 days, with neulasta on day 9, Gemcitabine dosed  reduced to 800 mg/m due to cytopenias, increased to '900mg'$ /m2 on 12/06/2016. Changes to gemcitabine maintenance therapy on day 1 and 8 every 21 days, from 04/18/2017, gemcitabine was reduced dose to 400 mg/m on 04/25/17       10/18/2016 Imaging    CT CAP  IMPRESSION: 1. Again noted are multifocal liver metastasis. Overall there has been no significant interval change in overall volume of liver metastases. 2. Similar appearance of upper abdominal adenopathy 3. Slight decrease in size of small pulmonary metastases.      01/25/2017 Imaging    CT CAP W Contrast 01/25/17 IMPRESSION: 1. Response to therapy. 2. Similar to minimal decrease in pulmonary nodules/metastasis. 3. Improvement in hepatic disease burden. 4. Decrease in abdominal adenopathy. 5. Significantly age advanced atherosclerosis, including within the coronary arteries. 6. Uterine fibroids. 7. Suspect a complex cyst in the left ovary. Recommend attention on follow-up.       04/13/2017 Imaging    CT CAP W Contrast IMPRESSION: 1. The dominant caudate lobe mass is stable in size and appearance, as is the indistinct adenopathy in the porta hepatis adjacent to the caudate lobe, and in the retroperitoneum. Several subtle additional liver lesions are slightly less conspicuous on today' s exam. The pulmonary nodules are stable, and mild omental nodularity in the left lower quadrant is very slightly worsened compared to prior. Overall the burden of malignancy appears similar to prior, and some of the findings may represent effectively treated metastatic lesions. 2. Complex left  adnexal mass, 6.7 by 4.1 cm, enlarging. Pelvic sonography recommended for further characterization. 3. Other imaging findings of potential clinical significance: Aortic Atherosclerosis (ICD10-I70.0). Coronary atherosclerosis. Prominent stool throughout the colon favors constipation. Fibroid uterus.      05/16/2017 - 06/27/2017 Chemotherapy    Second-line  chemo FOLFOX every 2 weeks with neulasta after pump DC. She will started with low dose FOLFOX on 05/16/17, due to her neutropenia. Held oxaliplatin with cycle two due to poor toleration. Discontinued after cycle 3 (06/27/17) due to disease progression.       07/20/2017 Imaging    IMPRESSION: CT CHEST  1. Interval development of 2 small new pulmonary nodules concerning for progressive, or new metastatic disease. Recommend continued attention on follow-up imaging. 2. The previously noted right upper and left upper lobe pulmonary nodules remain stable in size consistent with treated metastatic disease. 3. Stable position of right IJ approach power injectable port catheter. 4. Coronary artery calcifications. CT ABD/PELVIS  1. Marked interval enlargement of a complex, enhancing, cystic and solid left ovarian mass which now fills the posterior aspect of the pelvis exerting mass effect on the adjacent rectosigmoid colon and bladder. This likely accounts for the patient's lower abdominal pain and sensation of constipation (there is no significant stool burden within the rectum or sigmoid colon). These findings are highly suspicious for a primary ovarian cystic or mucinous adenocarcinoma. 2. Enlarging omental implants in the left lower quadrant highly concerning for metastatic ovarian carcinoma. 3. New/enlarging subcapsular hepatic lesion may represent progressive cholangio or ovarian carcinoma metastases. 4. Enlarging caudate lobe lesion and aortocaval/portacaval confluent necrotic adenopathy concerning for progressive metastatic cholangiocarcinoma. 5. The segment 6 hepatic lesion remains stable or may be smaller on today's examination. These results will be called to the ordering clinician or representative by the Radiologist Assistant, and communication documented in the PACS or zVision Dashboard.        08/05/2017 Pathology Results    Diagnosis 08/05/17  Peritoneum, biopsy,  left lower abdomen METASTATIC ADENOCARCINOMA, CONSISTENT WITH CHOLANGIOCARCINOMA. Microscopic Comment      08/16/2017 -  Chemotherapy    CAPOX: Xeloda '1500mg'$  BID for 1 week on and 1 week off with low dose oxaliplatin starting 08/16/17         HISTORY OF PRESENTING ILLNESS: 07/28/16 Nancy [name] 52 y.o. female is here because of Her recently diagnosed metastatic glandular carcinoma. She is accompanied by her husband and mother to my clinic today.  She has been having RUQ abdominal pain since mid 04/2016, she was seen by PCP and was treated for gas with GI cocktail and pepcide, which she did not help much. Her pain got worse, and radiates to right shoulder, she denies significant nausea, or bloating, BM normal, no fever, cough or dyspnea. She recently noticed mild chest discomfort in the low sternum area. She initially had the lab, ultrasound done by her primary care physician, which was unrevealing. She was referred to GI Dr. Hilarie Fredrickson and underwent EGD and CT scan, which showed a lobulated mass in the caudate lobe of liver, with at adjacent adenopathy. He underwent EUS and fine-needle biopsy of the liver mass and lymph nodes, all reviewed adenocarcinoma.  She has lost about 7lbs in the past 3 months. She has mild fatigue, but able to function at home. She has stopped working due to her abdominal pain and fatigue. She takes Norco every 1-3 times a day, and her pain seems not well controlled.  CURRENT THERAPY: Third line CAPOX: Xeloda '1500mg'$  BID for 1  week on and 1 week off with low dose oxaliplatin every 2 weeks starting 08/16/17    INTERIM HISTORY:  Nancy [name] returns for follow up and is accompanied by her aunt. She reports she went to ER yesterday due to abdominal pain. She woke up yesterday and had automatic vomiting. She used her antiemetics patch and continued with pain since. She was given 2 shots of Dilaudid in ER. That did not last long for her. At home she took oxycodone and OxyContin.  She could not sleep well from pain. In infusion room today she was given more dilaudid IV. Right now her pain is down to 2/10. With Xeloda she completed her 7 days and she was able to tolerate her Oxaliplatin infusion. She did get to move to [facility] this weekend. This did tire her.     REVIEW OF SYSTEMS: Constitutional: Denies fevers, chills or abnormal night sweats(+) improved appetite  (+) weight gain (+) more energy  Eyes: Denies blurriness of vision, double vision or watery eyes Ears, nose, mouth, throat, and face: Denies mucositis or sore throat Respiratory: Denies dyspnea or wheezes Cardiovascular: Denies palpitation, chest discomfort or lower extremity swelling Gastrointestinal:  Denies heartburn or change in bowel habits  (+) increase in controlled nausea and abdominal pain, pain now 2/10 with IV dilaudid.  Skin: Denies abnormal skin rashes  Lymphatics: Denies new lymphadenopathy or easy bruising Neurological:Denies numbness, tingling or new weaknesses (+) "chemo brain" (+)  distibular migraine Behavioral/Psych: Mood is stable, no new changes  (+) anxiety  Musculoskeletal: (+) back pain  All other systems were reviewed with the patient and are negative.   MEDICAL HISTORY:  Past Medical History:  Diagnosis Date  . Allergy    SEASONAL  . Anemia    H[employer] SCHOOL  . Atopic eczema   . cholangio ca dx'd 07/12/16  . Fibrocystic breast disease   . GERD (gastroesophageal reflux disease)   . Hx of migraines    seasonal   . Personal history of chemotherapy   . Pneumonia    6 years ago   . TIA (transient ischemic attack)     SURGICAL HISTORY: Past Surgical History:  Procedure Laterality Date  . EUS N/A 07/07/2016   Procedure: UPPER ENDOSCOPIC ULTRASOUND (EUS) RADIAL;  Surgeon: [name], MD;  Location: [facility];  Service: Endoscopy;  Laterality: N/A;  . IR GENERIC HISTORICAL  08/03/2016   IR US GUIDE VASC ACCESS RIGHT 08/03/2016 Corrie Mckusick, DO [facility]  . IR  GENERIC HISTORICAL  08/03/2016   IR FLUORO GUIDE PORT INSERTION RIGHT 08/03/2016 Corrie Mckusick, DO [facility]  . MYOMECTOMY  2002    SOCIAL HISTORY: Social History   Socioeconomic History  . Marital status: Married    Spouse name: Not on file  . Number of children: 0  . Years of education: 80  . Highest education level: Not on file  Social Needs  . Financial resource strain: Not on file  . Food insecurity - worry: Not on file  . Food insecurity - inability: Not on file  . Transportation needs - medical: Not on file  . Transportation needs - non-medical: Not on file  Occupational History  . Occupation: HR fml[employer]    Employer: [employer]    Employer: [employer]  Tobacco Use  . Smoking status: Never Smoker  . Smokeless tobacco: Never Used  Substance and Sexual Activity  . Alcohol use: Yes    Comment: maybe 3 times a year  . Drug use: No  .  Sexual activity: Yes    Partners: Male  Other Topics Concern  . Not on file  Social History Narrative  . Not on file    FAMILY HISTORY: Family History  Problem Relation Age of Onset  . Gout Mother   . Diabetes Mother   . Hypertension Father   . Diabetes Father   . Breast cancer Paternal Aunt   . Cancer Maternal Grandfather        throat cancer   . Cancer Cousin 81       GI cancer   . Thyroid disease Neg Hx     ALLERGIES:  is allergic to penicillins; contrast media [iodinated diagnostic agents]; and lipitor [atorvastatin].  MEDICATIONS:  Current Outpatient Medications  Medication Sig Dispense Refill  . ALPRAZolam (XANAX) 0.5 MG tablet Take 1 tablet (0.5 mg total) by mouth 3 (three) times daily as needed for anxiety. 30 tablet 0  . aspirin 81 MG tablet Take 81 mg by mouth daily.    . capecitabine (XELODA) 500 MG tablet Take 3 tablets (1,500 mg total) by mouth 2 (two) times daily after a meal. Take for 7 days on, 7 days off, repeat (Patient not taking: Reported on 08/16/2017) 84 tablet 1  . cetirizine (ZYRTEC) 10 MG tablet Take 10  mg by mouth daily as needed for allergies. For allergies     . Cholecalciferol (VITAMIN D3) 5000 UNITS CAPS Take 5,000 Units by mouth daily.     . COLOSTRUM PO Take 1.25 g by mouth 2 (two) times daily.     Marland Kitchen dicyclomine (BENTYL) 20 MG tablet Take 20 mg by mouth 3 (three) times daily as needed for spasms.     Marland Kitchen dronabinol (MARINOL) 5 MG capsule Take 1 capsule (5 mg total) by mouth 2 (two) times daily before a meal. (Patient not taking: Reported on 08/16/2017) 60 capsule 1  . HYDROmorphone (DILAUDID) 2 MG tablet Take 1-2 tablets (2-4 mg total) by mouth every 4 (four) hours as needed for severe pain. 60 tablet 0  . lidocaine-prilocaine (EMLA) cream Apply 1 application topically as needed. Apply to portacath 1 1/2 hours - 2 hours prior to procedures as needed. 30 g 1  . LORazepam (ATIVAN) 0.5 MG tablet Place under tongue and dissolve every 8 hours as needed for nausea. Do not take Xanax while taking this medication. 30 tablet 0  . magnesium hydroxide (MILK OF MAGNESIA) 400 MG/5ML suspension Take 30 mLs by mouth daily as needed for mild constipation.    . megestrol (MEGACE) 400 MG/10ML suspension Take 10 mLs (400 mg total) by mouth daily. 240 mL 0  . Menaquinone-7 (VITAMIN K2) 40 MCG TABS Take 40 mg by mouth daily.     . mometasone (ELOCON) 0.1 % lotion Apply topically daily. (Patient taking differently: Apply 1 application topically daily as needed (itching). ) 60 mL 0  . OLANZapine (ZYPREXA) 2.5 MG tablet Take 1 tablet (2.5 mg total) by mouth at bedtime. 30 tablet 1  . omeprazole (PRILOSEC) 40 MG capsule Take 1 capsule (40 mg total) by mouth 2 (two) times daily before a meal. 60 capsule 1  . ondansetron (ZOFRAN) 8 MG tablet Take 1 tablet (8 mg total) by mouth every 8 (eight) hours as needed for nausea or vomiting. 30 tablet 3  . oxyCODONE (OXYCONTIN) 60 MG 12 hr tablet Take 60 mg by mouth every 12 (twelve) hours. 60 each 0  . Oxycodone HCl 10 MG TABS Take 1 tablet (10 mg total) by mouth every 3 (  three)  hours as needed. 100 tablet 0  . polyethylene glycol (MIRALAX / GLYCOLAX) packet Take 17 g by mouth 2 (two) times daily. Hold if you develop diarrhea (Patient not taking: Reported on 08/16/2017) 100 each 0  . promethazine (PHENERGAN) 25 MG tablet Take 1 tablet (25 mg total) by mouth every 6 (six) hours as needed for nausea or vomiting. (Patient not taking: Reported on 08/16/2017) 40 tablet 0  . scopolamine (TRANSDERM-SCOP) 1 MG/3DAYS Place 1 patch (1.5 mg total) every 3 (three) days onto the skin. 10 patch 2  . senna (SENOKOT) 8.6 MG TABS tablet Take 2 tablets (17.2 mg total) by mouth 2 (two) times daily. 120 each 0   No current facility-administered medications for this visit.    Facility-Administered Medications Ordered in Other Visits  Medication Dose Route Frequency Provider Last Rate Last Dose  . alteplase (CATHFLO ACTIVASE) injection 2 mg  2 mg Intracatheter Once PRN Truitt Merle, MD      . heparin lock flush 100 unit/mL  250 Units Intracatheter Once PRN Truitt Merle, MD      . ondansetron [facility]) injection 8 mg  8 mg Intravenous Once Truitt Merle, MD      . sodium chloride flush (NS) 0.9 % injection 10 mL  10 mL Intracatheter PRN Truitt Merle, MD   10 mL at 09/09/16 1645  . sodium chloride flush (NS) 0.9 % injection 10 mL  10 mL Intravenous PRN Truitt Merle, MD   10 mL at 02/11/17 1515   PHYSICAL EXAMINATION:  ECOG PERFORMANCE STATUS: 2 There were no vitals filed for this visit.  GENERAL:alert, no distress and comfortable SKIN: skin color, texture, turgor are normal, no rashes or significant lesions, several hematomas on extremities  EYES: normal, conjunctiva are pink and non-injected, sclera clear OROPHARYNX:no exudate, no erythema and lips, buccal mucosa (+) oral thrush  NECK: supple, thyroid normal size, non-tender, without nodularity LYMPH:  no palpable lymphadenopathy in the cervical, axillary or inguinal LUNGS: clear to auscultation and percussion with normal breathing effort HEART: regular  rate & rhythm and no murmurs and no lower extremity edema ABDOMEN:abdomen soft, non-tender and active bowel sounds, no hepatomegaly  Musculoskeletal:no cyanosis of digits and no clubbing  PSYCH: alert & oriented x 3 with fluent speech NEURO: no focal motor/sensory deficits  LABORATORY DATA:  I have reviewed the data as listed CBC Latest Ref Rng & Units 08/31/2017 08/30/2017 08/21/2017  WBC 3.9 - 10.3 10e3/uL 3.8(L) 3.6(L) 4.8  Hemoglobin 11.6 - 15.9 g/dL 10.0(L) 9.4(L) 9.6(L)  Hematocrit 34.8 - 46.6 % 30.0(L) 28.9(L) 29.2(L)  Platelets 145 - 400 10e3/uL 198 193 224   CMP Latest Ref Rng & Units 08/31/2017 08/30/2017 08/21/2017  Glucose 70 - 140 mg/dl 103 89 104(H)  BUN 7.0 - 26.0 mg/dL 11.'4 13 12  '$ Creatinine 0.6 - 1.1 mg/dL 0.8 0.78 0.59  Sodium 136 - 145 mEq/L 137 137 135  Potassium 3.5 - 5.1 mEq/L 3.4(L) 3.8 3.4(L)  Chloride 101 - 111 mmol/L - 103 104  CO2 22 - 29 mEq/L 21(L) 22 24  Calcium 8.4 - 10.4 mg/dL 9.3 8.9 8.6(L)  Total Protein 6.4 - 8.3 g/dL 7.1 6.6 5.9(L)  Total Bilirubin 0.20 - 1.20 mg/dL 0.77 0.8 0.5  Alkaline Phos 40 - 150 U/L 32(L) 26(L) 24(L)  AST 5 - 34 U/L '20 22 21  '$ ALT 0 - 55 U/L 13 12(L) 10(L)    CA19.9 (0-35U/ml) 07/28/2016: 36 08/18/2016: 51 09/13/2016: 56 10/19/2016: 43 11/09/2016: 44 12/06/2016: 41 01/05/2017: 35  02/02/2017: 37 03/07/17: 43 04/18/17: 66 05/16/17: 111 06/08/17: 119 06/18/17: 107 07/18/17: 161 08/16/17: 272   CA 125 07/25/17: 100.6    PATHOLOGY    Diagnosis 08/05/17  Peritoneum, biopsy, left lower abdomen METASTATIC ADENOCARCINOMA, CONSISTENT WITH CHOLANGIOCARCINOMA. Microscopic Comment The adenocarcinoma stains positive for ck7, negative for ER, GATA 3, ck20, cdx2, and TTF-1. Pax 8 (patchy stain). The morphology and immunostain pattern support the above diagnosis. The block B is sent to [facility] for further molecular testing.   Diagnosis 07/07/2016 FINE NEEDLE ASPIRATION, ENDOSCOPIC, LIVER (SPECIMEN 2 OF 2 COLLECTED  07/07/16): MALIGNANT CELLS CONSISTENT WITH ADENOCARCINOMA.  ADDITIONAL INFORMATION: Per request, immunohistochemistry was performed. The cells are positive for cytokeratin 7 and negative for cytokeratin 20 and CDX-2. This excludes a colorectal primary. Pancreaticobiliary and upper GI sources remain in the differential. Called to Dr. Burr Medico on 07/28/2016.  Diagnosis 07/07/2016 FINE NEEDLE ASPIRATION, ENDOSCOPIC, GASTRO-HEPATIC LYMPH NODE (SPECIMEN 1 OF 2 COLLECTED 07/07/16): MALIGNANT CELLS CONSISTENT WITH ADENOCARCINOMA, SEE COMMENT. Preliminary Diagnosis Intraoperative Diagnosis: Few atypical glandular clusters - recommend additional material. 4) Adequate ([facility])   PROCEDURES  Upper EUS 07/07/2016 Dr. Ardis Hughs  - Endoscopically normal UGI tract (good views with standard gastroscope) - Vaguely bordered 4cm mass in the caudate lobe of liver that is confluent with what appear to be enlarged gastrohepatic lymphnodes (these may represent direct tumor extension however). Preliminary cytology from the lymphnodes shows malignancy, glandular. Preliminary cytology from the liver mass shows the same. Await final cytology results but this is most suspicious for peripheral intrahepatic cholangiocarcinoma with adjacent malignant adenopathy.  COLONOSCOPY 05/22/2014 ENDOSCOPIC IMPRESSION: 1. Sessile polyp measuring 4 mm in size was found in the sigmoid colon; polypectomy was performed with a cold snare 2. The colon mucosa was otherwise normal    RADIOGRAPHIC STUDIES: I have personally reviewed the radiological images as listed and agreed with the findings in the report.   CT CAP W Contrast 07/21/17 IMPRESSION: CT CHEST 1. Interval development of 2 small new pulmonary nodules concerning for progressive, or new metastatic disease. Recommend continued attention on follow-up imaging. 2. The previously noted right upper and left upper lobe pulmonary nodules remain stable in size consistent with treated  metastatic disease. 3. Stable position of right IJ approach power injectable port catheter. 4. Coronary artery calcifications. CT ABD/PELVIS 1. Marked interval enlargement of a complex, enhancing, cystic and solid left ovarian mass which now fills the posterior aspect of the pelvis exerting mass effect on the adjacent rectosigmoid colon and bladder. This likely accounts for the patient's lower abdominal pain and sensation of constipation (there is no significant stool burden within the rectum or sigmoid colon). These findings are highly suspicious for a primary ovarian cystic or mucinous adenocarcinoma. 2. Enlarging omental implants in the left lower quadrant highly concerning for metastatic ovarian carcinoma. 3. New/enlarging subcapsular hepatic lesion may represent progressive cholangio or ovarian carcinoma metastases. 4. Enlarging caudate lobe lesion and aortocaval/portacaval confluent necrotic adenopathy concerning for progressive metastatic cholangiocarcinoma. 5. The segment 6 hepatic lesion remains stable or may be smaller on today's examination. These results will be called to the ordering clinician or representative by the Radiologist Assistant, and communication documented in the PACS or zVision Dashboard.   CT CAP W Contrast, 04/13/17 IMPRESSION: 1. The dominant caudate lobe mass is stable in size and appearance, as is the indistinct adenopathy in the porta hepatis adjacent to the caudate lobe, and in the retroperitoneum. Several subtle additional liver lesions are slightly less conspicuous on today' s exam. The pulmonary  nodules are stable, and mild omental nodularity in the left lower quadrant is very slightly worsened compared to prior. Overall the burden of malignancy appears similar to prior, and some of the findings may represent effectively treated metastatic lesions. 2. Complex left adnexal mass, 6.7 by 4.1 cm, enlarging. Pelvic sonography recommended for  further characterization. 3. Other imaging findings of potential clinical significance: Aortic Atherosclerosis (ICD10-I70.0). Coronary atherosclerosis. Prominent stool throughout the colon favors constipation. Fibroid uterus.  CT CAP W Contrast 01/25/17 IMPRESSION: 1. Response to therapy. 2. Similar to minimal decrease in pulmonary nodules/metastasis. 3. Improvement in hepatic disease burden. 4. Decrease in abdominal adenopathy. 5. Significantly age advanced atherosclerosis, including within the coronary arteries. 6. Uterine fibroids. 7. Suspect a complex cyst in the left ovary. Recommend attention on follow-up.  CT CAP W CONTRAST 10/18/16 IMPRESSION: 1. Again noted are multifocal liver metastasis. Overall there has been no significant interval change in overall volume of liver metastases. 2. Similar appearance of upper abdominal adenopathy 3. Slight decrease in size of small pulmonary metastases.   ASSESSMENT & PLAN:  52 y.o. African-American female, without significant past medical history, presented with abdominal pain  1. Metastatic intrahepatic cholangiocarcinoma, with probable node and lung metastasis, [id number], stage IV -I previously reviewed her CT, MRI, endoscopy findings and her biopsy results with patient and her family members in details. -We have previously reviewed her case in our GI tumor board. Based on the scans and endoscopy findings, this is most consistent with cholangiocarcinoma. Her liver and inguinal biopsy showed adenocarcinoma, IHC was consistent with pancreatic-biliary primary -She has 2 additional small liver lesions and 2 4-81m lung lesions, which are indeterminate but that is suspicious for metastatic disease. -She was seen by surgeon Dr. BBarry Dieneswho thinks she is a not a candidate for surgical resection due to her metastatic disease. -We previously discussed her cancer is incurable at this stage, the goal of therapy is palliation, to prolong life and  improve her quality life -she is on first-line cisplatin and gemcitabine, on day 1, 8 every 21 days.  -She has been tolerating chemotherapy moderately well, but had significant neutropenia and thrombocytopenia, cycle 2 chemotherapy was dose reduced -I previously reviewed her restaging CT scan from 10/19/2016, which showed stable disease overall, no other new lesions. She is clinically doing well, her abdominal pain has much improved. -CT from 5/29 reviewed with pt and it shows excellent partial response to treatment, both liver lesions and adenopathy have decreased in size, small lung nodules are stable, no new lesions.  -I previously reviewed her CT scan from 04/13/2017, which showed a stable disease overall. Her tumor marker CA 19.9 has been slowly trending up, concerning for early resistance. -She has developed prolonged neutropenia, secondary to chemotherapy.  -She started with low dose FOLFOX on 05/16/17, due to her neutropenia. Held oxaliplatin with cycle two due to poor toleration.  -Her CT CAP from 07/21/17 showed new peritoneal carcinomatosis with a large complex ovarian mass, primary ovarian cancer vs metastatic cholangiocarcinoma. I recommend IR biopsy of her peritoneal lesion, checked CA125 and it was 100.6 on 07/25/17.  -Her IR biopsy of LLQ omental implant from 08/05/17 showed metastatic disease consistent with cholangiocarcinoma. I have [facility] or [facility]. 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  Unfortunately she is not a candidate for immunotherapy based on  the MSI, there was one rearrangement in intron is unlikely to predict response to TKI such as crizotinib. She also has ERBB2 amplification, I will request our pathologist to check HER2 by FISH, if she does have HER-2 amplification, I may  consider adding Herceptin to her chemo. -She had increase in abdominal pain and nausea since yesterday, possible related to underlying malignancy, less likely related to chemo. Will post pone treatment 1 week to give time to control pain.  -increased her Dilaudid and OxyContin.  -F/u on 1/14    2. Abdominal pain, back pain  --- She has had worsening abdominal pain lately due to cancer progression, now much better controlled  -Continue OxyContin 60 mg every 12 hours, and oxycodone 10 mg every 3 hours as needed for pain  -She also has small amount of Dilaudid at home, she will use it as needed for severe pain  -she takes Xanax about 3 times a week now. Refilled on 08/16/17 abdominal pain exacerbated by treatment, will post pone treatment 1 week to stabilize her pain.  -Will continue 60 mg of OxyContin increase to every 8 hrs and will increase Dilaudid. I refilled Dilaudid today (08/31/17)   3. Nausea and anorexia and weight loss -We previously reviewed her nausea management, she will continue Compazine and Zofran -I previously encouraged her to take a nutritional supplement -We previously discussed appetite stimulant, she is on Marinol, dose was increased to 5 mg twice daily last week, appetite improved, will continue. -she will f/u with dietician  -She has tried Marinol, now on Megace, appetite has improved since her pain is controlled -Continue Megace -Nausea controlled with scopolamine patch, PPI, and zofran   4. Neutropenia and anemia -Secondary to chemotherapy, neutropenia much improved with neulasta. -She is taking pre-natal vitamins that contain B-12 which is helpful -Continue monitoring  -She has persistent neutropenia from chemotherapy, we'll continue GCSF after chemo.  -Her Hg is stable at 10.0 and her WBC is 3.6, ANC normal at 1.6 (08/16/17)  5. Goal of care discussion  -We previously discussed the incurable nature of her cancer, and the overall poor prognosis, especially she  has had a major disease progression lately, and she has had several minor treatment.  Her prognosis is guarded -The patient understands the goal of care is palliative. -I have previously recommended DNR/DNI, she will think about it.   6. Constipation -I discussed this can worsen with increase in pain medication   -I suggest she can take 4-6 tablets of seneca a day. If not enough she can take half bottle magnesium citrate until she has a BM. She can take seneca at least 2 tablets daily to make sure she has regular BM -She will continue senokot with or without miralax daily to maintain BM q1-2 days. She will take mag citrate if no BM in 4 days  7. Peripheral Neuropathy, grade 1 -to her feet, likely secondary to chemotherapy.    Plan -Refilled Dilaudid today, she will take 2-4 mg every 4 hours as needed for severe pain -We will increase OxyContin to 60 mg every 8 hours -Given IV Fluids with Dilaudid '1mg'$  (X3) and antiemetics today  -Chemo cancelled today, reschedule to 1/5 or 1/7 and postpone appointments on 1/14 for one week, I will see her on 1/21 -I have spoke with pathologist Dr. Lyndon Code and requested HER2 FISH on her biopsy    All questions were answered. The patient knows to call the clinic with any problems, questions or concerns.  I spent  25 minutes counseling the patient face to face. The total time spent in the appointment was 30 minutes and more than 50% was on counseling.  This document serves as a record of services personally performed by Truitt Merle, MD. It was created on her behalf by Joslyn Devon, a trained medical scribe. The creation of this record is based on the scribe's personal observations and the provider's statements to them.    I have reviewed the above documentation for accuracy and completeness, and I agree with the above.   Truitt Merle, MD 08/31/2017

## 2017-09-03 ENCOUNTER — Ambulatory Visit (HOSPITAL_BASED_OUTPATIENT_CLINIC_OR_DEPARTMENT_OTHER): Payer: Federal, State, Local not specified - PPO

## 2017-09-03 VITALS — BP 149/95 | HR 92 | Temp 98.9°F | Resp 18

## 2017-09-03 DIAGNOSIS — Z5111 Encounter for antineoplastic chemotherapy: Secondary | ICD-10-CM

## 2017-09-03 DIAGNOSIS — C7801 Secondary malignant neoplasm of right lung: Principal | ICD-10-CM

## 2017-09-03 DIAGNOSIS — C221 Intrahepatic bile duct carcinoma: Secondary | ICD-10-CM

## 2017-09-03 DIAGNOSIS — C786 Secondary malignant neoplasm of retroperitoneum and peritoneum: Secondary | ICD-10-CM

## 2017-09-03 MED ORDER — DEXTROSE 5 % IV SOLN
Freq: Once | INTRAVENOUS | Status: AC
  Administered 2017-09-03: 10:00:00 via INTRAVENOUS

## 2017-09-03 MED ORDER — SODIUM CHLORIDE 0.9 % IV SOLN
Freq: Once | INTRAVENOUS | Status: AC
  Administered 2017-09-03: 10:00:00 via INTRAVENOUS
  Filled 2017-09-03: qty 5

## 2017-09-03 MED ORDER — OXALIPLATIN CHEMO INJECTION 100 MG/20ML
40.0000 mg/m2 | Freq: Once | INTRAVENOUS | Status: AC
  Administered 2017-09-03: 65 mg via INTRAVENOUS
  Filled 2017-09-03: qty 13

## 2017-09-03 MED ORDER — HEPARIN SOD (PORK) LOCK FLUSH 100 UNIT/ML IV SOLN
500.0000 [IU] | Freq: Once | INTRAVENOUS | Status: AC | PRN
  Administered 2017-09-03: 500 [IU]
  Filled 2017-09-03: qty 5

## 2017-09-03 MED ORDER — SODIUM CHLORIDE 0.9% FLUSH
10.0000 mL | INTRAVENOUS | Status: DC | PRN
  Administered 2017-09-03: 10 mL
  Filled 2017-09-03: qty 10

## 2017-09-03 MED ORDER — PALONOSETRON HCL INJECTION 0.25 MG/5ML
INTRAVENOUS | Status: AC
  Filled 2017-09-03: qty 5

## 2017-09-03 MED ORDER — PALONOSETRON HCL INJECTION 0.25 MG/5ML
0.2500 mg | Freq: Once | INTRAVENOUS | Status: AC
  Administered 2017-09-03: 0.25 mg via INTRAVENOUS

## 2017-09-03 NOTE — Patient Instructions (Signed)
[facility] Discharge Instructions for Patients Receiving Chemotherapy  Today you received the following chemotherapy agents:  Oxaliplatin  To help prevent nausea and vomiting after your treatment, we encourage you to take your nausea medication as prescribed.   If you develop nausea and vomiting that is not controlled by your nausea medication, call the clinic.   BELOW ARE SYMPTOMS THAT SHOULD BE REPORTED IMMEDIATELY:  *FEVER GREATER THAN 100.5 F  *CHILLS WITH OR WITHOUT FEVER  NAUSEA AND VOMITING THAT IS NOT CONTROLLED WITH YOUR NAUSEA MEDICATION  *UNUSUAL SHORTNESS OF BREATH  *UNUSUAL BRUISING OR BLEEDING  TENDERNESS IN MOUTH AND THROAT WITH OR WITHOUT PRESENCE OF ULCERS  *URINARY PROBLEMS  *BOWEL PROBLEMS  UNUSUAL RASH Items with * indicate a potential emergency and should be followed up as soon as possible.  Feel free to call the clinic should you have any questions or concerns. The clinic phone number is [phone].  Please show the [facility] at check-in to the Emergency Department and triage nurse.

## 2017-09-03 NOTE — Progress Notes (Signed)
Oxaliplatin dose decreased to 40 mg/m2 as was given for cycle #1.  Per Dr Ernestina Penna note patient is to receive low dose.  In Epic treatment plan had entered in as 85 mg/m2 for 09/03/17 dose.  Per Dr Benay Spice maintain dose at 40 mg/m2 for today and notify Dr Burr Medico.  Henreitta Leber, PharmD

## 2017-09-05 ENCOUNTER — Telehealth: Payer: Self-pay | Admitting: *Deleted

## 2017-09-05 NOTE — Telephone Encounter (Signed)
Pt called stating she received chemo on Sat 09/03/17.   Wanted to know if she should resume Xeloda po.  Dr. Burr Medico notified.  Spoke with pt again.  Instructed pt re:  Per Dr. Burr Medico,  Restart  Xeloda  1500 mg this pm.  Then Xeloda 1500 mg  BID for 7 days on;  7 days off.  Pt understood to take Xeloda 1500 mg in the am only next Monday 09/12/17. Pt voiced understanding. Pt's   Phone    [phone].

## 2017-09-06 ENCOUNTER — Telehealth: Payer: Self-pay | Admitting: Pharmacist

## 2017-09-06 DIAGNOSIS — C221 Intrahepatic bile duct carcinoma: Secondary | ICD-10-CM

## 2017-09-06 DIAGNOSIS — C7800 Secondary malignant neoplasm of unspecified lung: Principal | ICD-10-CM

## 2017-09-06 MED ORDER — CAPECITABINE 500 MG PO TABS: 1000.0000 mg/m2 | ORAL_TABLET | Freq: Two times a day (BID) | ORAL | 1 refills | Status: DC

## 2017-09-06 NOTE — Telephone Encounter (Signed)
Oral Oncology Pharmacist Encounter  Received notification from the [facility] that they are no longer able to fill patient's Xeloda.  Xeloda prescription e-scribed to [facility] per insurance requirement as tey are the pharmacy contracted to fill for patient in the [employer] program (FEP).  I LVM for patient with Rx update and phone number to [facility] department at the pharmacy [phone]).  Oral Oncology Clinic will continue to follow.  Johny Drilling, PharmD, BCPS, BCOP 09/06/2017 2:11 PM Oral Oncology Clinic [phone]

## 2017-09-07 ENCOUNTER — Encounter: Payer: Self-pay | Admitting: General Practice

## 2017-09-07 NOTE — Progress Notes (Signed)
[facility] CSW Progress Note  Call from patient requesting to speak w social worker re adjustment to illness issues/depression.  Call returned, left VM requesting call back to schedule.  Edwyna Shell, LCSW Clinical Social Worker Phone:  [phone]

## 2017-09-12 ENCOUNTER — Ambulatory Visit: Payer: Federal, State, Local not specified - PPO | Admitting: Hematology

## 2017-09-12 ENCOUNTER — Telehealth: Payer: Self-pay | Admitting: *Deleted

## 2017-09-12 ENCOUNTER — Ambulatory Visit: Payer: Federal, State, Local not specified - PPO

## 2017-09-12 ENCOUNTER — Other Ambulatory Visit: Payer: Federal, State, Local not specified - PPO

## 2017-09-12 NOTE — Telephone Encounter (Signed)
Received vm call from pt asking to speak with Dr Burr Medico regarding symptom management.  She states she stopped her chemo pills on Friday & noted that she had blood in her underwear that is like a period. Several attempts made to return call & only received vm.  Message left to call back tomorrow at last call.

## 2017-09-16 NOTE — Progress Notes (Signed)
Nancy [name]  [phone] [phone]  Clinic Follow Up Note   Patient Care Team: Nancy [name], Nancy Apa, DO as PCP - General Nancy [name], Nancy Lines, MD as Consulting Physician (Gastroenterology)   Date of Service:  09/19/2017  CHIEF COMPLAINTS:  Follow up metastatic intrahepatic cholangiocarcinoma  Oncology History   Metastatic cholangiocarcinoma to lung [facility])   Staging form: Perihilar Bile Ducts, AJCC 7th Edition   - Clinical stage from 07/07/2016: Stage IVB (T2b, N1, M1) - Signed by Truitt Merle, MD on 07/28/2016      Metastatic cholangiocarcinoma to lung ([place])   05/22/2014 Procedure    Routine screening colonoscopy showed a sessile polyp measuring 4 mm in the sigmoid colon, removed, otherwise negative.      06/29/2016 Imaging    CT chest, abdomen and pelvis showed 2 indeterminant nodule in left and the right lung, 4-75m, indeterminate a hypovascular liver lesions largest in the caudate lobe measuring 2.6 cm, mild abdominal lymphadenopathy in the gastrohepatic ligament and portocaval space.      07/02/2016 Imaging    Abdominal MRI with and without contrast showed 2 lobular lesions in the caudate lobe, and is regular lymph nodes in the gastrohepatic ligament which is adjacent to the liver lesion, suspicious for malignancy. 2 small lesions in the right hepatic lobe are indeterminate, but concerning for metastasis.      07/07/2016 Procedure    EGD was negative, EUS biopsy of the liver and adjacent lymph node      07/07/2016 Initial Diagnosis    Metastatic cholangiocarcinoma to lung ([place]      07/07/2016 Initial Biopsy    Fine-needle aspiration of the liver lesion in caudate lobe and adjacent lymph nodes both showed metastatic adenocarcinoma.      08/11/2016 - 04/25/2017 Chemotherapy    Cisplatin 25 mg/m, gemcitabine 1000 mg/m, on day one and 8, every 21 days   Cisplatin and gemcitabine on Day 1, 8 every 21 days, with neulasta on day 9, Gemcitabine  dosed reduced to 800 mg/m due to cytopenias, increased to '900mg'$ /m2 on 12/06/2016. Changes to gemcitabine maintenance therapy on day 1 and 8 every 21 days, from 04/18/2017, gemcitabine was reduced dose to 400 mg/m on 04/25/17       10/18/2016 Imaging    CT CAP  IMPRESSION: 1. Again noted are multifocal liver metastasis. Overall there has been no significant interval change in overall volume of liver metastases. 2. Similar appearance of upper abdominal adenopathy 3. Slight decrease in size of small pulmonary metastases.      01/25/2017 Imaging    CT CAP W Contrast 01/25/17 IMPRESSION: 1. Response to therapy. 2. Similar to minimal decrease in pulmonary nodules/metastasis. 3. Improvement in hepatic disease burden. 4. Decrease in abdominal adenopathy. 5. Significantly age advanced atherosclerosis, including within the coronary arteries. 6. Uterine fibroids. 7. Suspect a complex cyst in the left ovary. Recommend attention on follow-up.       04/13/2017 Imaging    CT CAP W Contrast IMPRESSION: 1. The dominant caudate lobe mass is stable in size and appearance, as is the indistinct adenopathy in the porta hepatis adjacent to the caudate lobe, and in the retroperitoneum. Several subtle additional liver lesions are slightly less conspicuous on today' s exam. The pulmonary nodules are stable, and mild omental nodularity in the left lower quadrant is very slightly worsened compared to prior. Overall the burden of malignancy appears similar to prior, and some of the findings may represent effectively treated metastatic lesions. 2. Complex left  adnexal mass, 6.7 by 4.1 cm, enlarging. Pelvic sonography recommended for further characterization. 3. Other imaging findings of potential clinical significance: Aortic Atherosclerosis (ICD10-I70.0). Coronary atherosclerosis. Prominent stool throughout the colon favors constipation. Fibroid uterus.      05/16/2017 - 06/27/2017 Chemotherapy     Second-line chemo FOLFOX every 2 weeks with neulasta after pump DC. She will started with low dose FOLFOX on 05/16/17, due to her neutropenia. Held oxaliplatin with cycle two due to poor toleration. Discontinued after cycle 3 (06/27/17) due to disease progression.       07/20/2017 Imaging    IMPRESSION: CT CHEST  1. Interval development of 2 small new pulmonary nodules concerning for progressive, or new metastatic disease. Recommend continued attention on follow-up imaging. 2. The previously noted right upper and left upper lobe pulmonary nodules remain stable in size consistent with treated metastatic disease. 3. Stable position of right IJ approach power injectable port catheter. 4. Coronary artery calcifications. CT ABD/PELVIS  1. Marked interval enlargement of a complex, enhancing, cystic and solid left ovarian mass which now fills the posterior aspect of the pelvis exerting mass effect on the adjacent rectosigmoid colon and bladder. This likely accounts for the patient's lower abdominal pain and sensation of constipation (there is no significant stool burden within the rectum or sigmoid colon). These findings are highly suspicious for a primary ovarian cystic or mucinous adenocarcinoma. 2. Enlarging omental implants in the left lower quadrant highly concerning for metastatic ovarian carcinoma. 3. New/enlarging subcapsular hepatic lesion may represent progressive cholangio or ovarian carcinoma metastases. 4. Enlarging caudate lobe lesion and aortocaval/portacaval confluent necrotic adenopathy concerning for progressive metastatic cholangiocarcinoma. 5. The segment 6 hepatic lesion remains stable or may be smaller on today's examination. These results will be called to the ordering clinician or representative by the Radiologist Assistant, and communication documented in the PACS or zVision Dashboard.        07/30/2017 Imaging     CT AP W Contrast  07/30/17 IMPRESSION: 1. No substantial interval change in exam. 2. Complex cystic and solid mass filling the cul-de-sac and left adnexal space is similar to prior. There is abnormal soft tissue in the anterior left lower quadrant concerning for metastatic disease. 3. Stable appearance of the caudate lesion in this patient with known cholangiocarcinoma. There is confluent necrotic lymphadenopathy in the porta hepatis, similar to prior with substantial mass-effect on the left renal vein as it enters the IVC. 4. Fibroid change in the uterus. 5. Trace intraperitoneal free fluid.      08/05/2017 Pathology Results    Diagnosis 08/05/17  Peritoneum, biopsy, left lower abdomen METASTATIC ADENOCARCINOMA, CONSISTENT WITH CHOLANGIOCARCINOMA. Microscopic Comment      08/08/2017 Imaging    CT AP WO Contrast 08/08/17 IMPRESSION: 1. No substantial interval change in exam although assessment today limited by lack of intravenous contrast material. 2. Stomach is distended with fluid, similar to prior. 3. Patient's known caudate liver lesion not well demonstrated on today's exam. 4. Similar appearance large complex cystic and solid mass in the cul-de-sac and left adnexal space. 5. Similar appearance abnormal soft tissue in the anterior left lower quadrant. 6. Fibroid change in the uterus.      08/16/2017 -  Chemotherapy    CAPOX: Xeloda '1500mg'$  BID for 1 week on and 1 week off with low dose oxaliplatin starting 08/16/17         HISTORY OF PRESENTING ILLNESS: 07/28/16 Nancy [name] 52 y.o. female is here because of Her recently diagnosed metastatic glandular carcinoma. She  is accompanied by her husband and mother to my clinic today.  She has been having RUQ abdominal pain since mid 04/2016, she was seen by PCP and was treated for gas with GI cocktail and pepcide, which she did not help much. Her pain got worse, and radiates to right shoulder, she denies significant nausea, or bloating, BM  normal, no fever, cough or dyspnea. She recently noticed mild chest discomfort in the low sternum area. She initially had the lab, ultrasound done by her primary care physician, which was unrevealing. She was referred to GI Dr. Hilarie Fredrickson and underwent EGD and CT scan, which showed a lobulated mass in the caudate lobe of liver, with at adjacent adenopathy. He underwent EUS and fine-needle biopsy of the liver mass and lymph nodes, all reviewed adenocarcinoma.  She has lost about 7lbs in the past 3 months. She has mild fatigue, but able to function at home. She has stopped working due to her abdominal pain and fatigue. She takes Norco every 1-3 times a day, and her pain seems not well controlled.  CURRENT THERAPY: Third line CAPOX: Xeloda '1500mg'$  BID for 1 week on and 1 week off with low dose oxaliplatin every 2 weeks starting 08/16/17. Stopped Xeloda 09/19/17 and start 5-FU pump.    INTERIM HISTORY:  Nancy [name] returns for follow up and is accompanied by her aunt. She reports she has thrush (5 days) and this is contributing to her decreased appetite and losing weight. She has some mild pain in her throat and cold sensitively in her throat as well. She has been vomiting after drinking cold beverages, but not warm liquid. She takes oxycontin twice daily and uses oxycodone twice weekly. She states her pain is controlled. She notes her neuropathy is greatly improved. Per pt, she says that she had some reddish vaginal discharge last week that lasted for 2 days. Her LMP was 1.5 years ago.   She is not pleased with Xeloda due to fatigue and difficulty taking the pill. She reports she is having orange colored urine even though she is drinking a lot of water. She reports dry mouth.   On review of systems, pt denies abdominal pain, or any other complaints at this time. Pt denies abdominal pain, nausea, vomiting. Pertinent positives are listed and detailed within the above HPI.     REVIEW OF SYSTEMS:   Constitutional: Denies fevers, chills or abnormal night sweats(+) decreased appetite  (+) weight loss  Eyes: Denies blurriness of vision, double vision or watery eyes Ears, nose, mouth, throat, and face: Denies mucositis or sore throat Respiratory: Denies dyspnea or wheezes Cardiovascular: Denies palpitation, chest discomfort or lower extremity swelling Gastrointestinal:  Denies heartburn or change in bowel habits  (+) increase in controlled nausea and abdominal pain, pain now 2/10 with IV dilaudid.  Skin: Denies abnormal skin rashes  Lymphatics: Denies new lymphadenopathy or easy bruising Neurological:Denies numbness, tingling or new weaknesses (+) "chemo brain" (+)  distibular migraine Behavioral/Psych: Mood is stable, no new changes  (+) anxiety  Musculoskeletal: (+) back pain  All other systems were reviewed with the patient and are negative.   MEDICAL HISTORY:  Past Medical History:  Diagnosis Date  . Allergy    SEASONAL  . Anemia    [employer]OL  . Atopic eczema   . cholangio ca dx'd 07/12/16  . Fibrocystic breast disease   . GERD (gastroesophageal reflux disease)   . Hx of migraines    seasonal   . Personal history of chemotherapy   .  Pneumonia    6 years ago   . TIA (transient ischemic attack)     SURGICAL HISTORY: Past Surgical History:  Procedure Laterality Date  . EUS N/A 07/07/2016   Procedure: UPPER ENDOSCOPIC ULTRASOUND (EUS) RADIAL;  Surgeon: [name], MD;  Location: [facility];  Service: Endoscopy;  Laterality: N/A;  . IR GENERIC HISTORICAL  08/03/2016   IR US GUIDE VASC ACCESS RIGHT 08/03/2016 Corrie Mckusick, DO [facility]  . IR GENERIC HISTORICAL  08/03/2016   IR FLUORO GUIDE PORT INSERTION RIGHT 08/03/2016 Corrie Mckusick, DO [facility]  . MYOMECTOMY  2002    SOCIAL HISTORY: Social History   Socioeconomic History  . Marital status: Married    Spouse name: Not on file  . Number of children: 0  . Years of education: 2  . Highest education  level: Not on file  Social Needs  . Financial resource strain: Not on file  . Food insecurity - worry: Not on file  . Food insecurity - inability: Not on file  . Transportation needs - medical: Not on file  . Transportation needs - non-medical: Not on file  Occupational History  . Occupation: HR fml[employer]    Employer: [employer]    Employer: [employer]  Tobacco Use  . Smoking status: Never Smoker  . Smokeless tobacco: Never Used  Substance and Sexual Activity  . Alcohol use: Yes    Comment: maybe 3 times a year  . Drug use: No  . Sexual activity: Yes    Partners: Male  Other Topics Concern  . Not on file  Social History Narrative  . Not on file    FAMILY HISTORY: Family History  Problem Relation Age of Onset  . Gout Mother   . Diabetes Mother   . Hypertension Father   . Diabetes Father   . Breast cancer Paternal Aunt   . Cancer Maternal Grandfather        throat cancer   . Cancer Cousin 42       GI cancer   . Thyroid disease Neg Hx     ALLERGIES:  is allergic to penicillins; contrast media [iodinated diagnostic agents]; and lipitor [atorvastatin].  MEDICATIONS:  Current Outpatient Medications  Medication Sig Dispense Refill  . ALPRAZolam (XANAX) 0.5 MG tablet Take 1 tablet (0.5 mg total) by mouth 3 (three) times daily as needed for anxiety. 30 tablet 0  . aspirin 81 MG tablet Take 81 mg by mouth daily.    . cetirizine (ZYRTEC) 10 MG tablet Take 10 mg by mouth daily as needed for allergies. For allergies     . Cholecalciferol (VITAMIN D3) 5000 UNITS CAPS Take 5,000 Units by mouth daily.     . COLOSTRUM PO Take 1.25 g by mouth 2 (two) times daily.     Marland Kitchen dicyclomine (BENTYL) 20 MG tablet Take 20 mg by mouth 3 (three) times daily as needed for spasms.     Marland Kitchen dronabinol (MARINOL) 5 MG capsule Take 1 capsule (5 mg total) by mouth 2 (two) times daily before a meal. 60 capsule 1  . HYDROmorphone (DILAUDID) 2 MG tablet Take 1-2 tablets (2-4 mg total) by mouth every 4 (four) hours  as needed for severe pain. 60 tablet 0  . lidocaine-prilocaine (EMLA) cream Apply 1 application topically as needed. Apply to portacath 1 1/2 hours - 2 hours prior to procedures as needed. 30 g 1  . LORazepam (ATIVAN) 0.5 MG tablet Place under tongue and dissolve every 8 hours  as needed for nausea. Do not take Xanax while taking this medication. 30 tablet 0  . magnesium hydroxide (MILK OF MAGNESIA) 400 MG/5ML suspension Take 30 mLs by mouth daily as needed for mild constipation.    . megestrol (MEGACE) 400 MG/10ML suspension Take 10 mLs (400 mg total) by mouth daily. 240 mL 0  . Menaquinone-7 (VITAMIN K2) 40 MCG TABS Take 40 mg by mouth daily.     Marland Kitchen omeprazole (PRILOSEC) 40 MG capsule Take 1 capsule (40 mg total) by mouth 2 (two) times daily before a meal. 60 capsule 1  . ondansetron (ZOFRAN) 8 MG tablet Take 1 tablet (8 mg total) by mouth every 8 (eight) hours as needed for nausea or vomiting. 30 tablet 3  . oxyCODONE (OXYCONTIN) 60 MG 12 hr tablet Take 60 mg by mouth every 12 (twelve) hours. 60 each 0  . Oxycodone HCl 10 MG TABS Take 1 tablet (10 mg total) by mouth every 3 (three) hours as needed. 100 tablet 0  . polyethylene glycol (MIRALAX / GLYCOLAX) packet Take 17 g by mouth 2 (two) times daily. Hold if you develop diarrhea 100 each 0  . scopolamine (TRANSDERM-SCOP) 1 MG/3DAYS Place 1 patch (1.5 mg total) every 3 (three) days onto the skin. 10 patch 2  . senna (SENOKOT) 8.6 MG TABS tablet Take 2 tablets (17.2 mg total) by mouth 2 (two) times daily. 120 each 0  . fluconazole (DIFLUCAN) 100 MG tablet Take 1 tablet (100 mg total) by mouth daily. 7 tablet 0  . mometasone (ELOCON) 0.1 % lotion Apply topically daily. (Patient not taking: Reported on 09/19/2017) 60 mL 0  . OLANZapine (ZYPREXA) 2.5 MG tablet Take 1 tablet (2.5 mg total) by mouth at bedtime. (Patient not taking: Reported on 09/19/2017) 30 tablet 1  . promethazine (PHENERGAN) 25 MG tablet Take 1 tablet (25 mg total) by mouth every 6 (six)  hours as needed for nausea or vomiting. (Patient not taking: Reported on 08/16/2017) 40 tablet 0   No current facility-administered medications for this visit.    Facility-Administered Medications Ordered in Other Visits  Medication Dose Route Frequency Provider Last Rate Last Dose  . alteplase (CATHFLO ACTIVASE) injection 2 mg  2 mg Intracatheter Once PRN Truitt Merle, MD      . heparin lock flush 100 unit/mL  250 Units Intracatheter Once PRN Truitt Merle, MD      . ondansetron [facility]) injection 8 mg  8 mg Intravenous Once Truitt Merle, MD      . sodium chloride flush (NS) 0.9 % injection 10 mL  10 mL Intracatheter PRN Truitt Merle, MD   10 mL at 09/09/16 1645  . sodium chloride flush (NS) 0.9 % injection 10 mL  10 mL Intravenous PRN Truitt Merle, MD   10 mL at 02/11/17 1515   PHYSICAL EXAMINATION:  ECOG PERFORMANCE STATUS: 2 Vitals:   09/19/17 0931  BP: 125/80  Pulse: 90  Resp: 20  Temp: 98.3 F (36.8 C)  TempSrc: Oral  SpO2: 100%  Weight: 112 lb 4.8 oz (50.9 kg)  Height: '5\' 6"'$  (1.676 m)    GENERAL:alert, no distress and comfortable SKIN: skin color, texture, turgor are normal, no rashes or significant lesions, several hematomas on extremities  EYES: normal, conjunctiva are pink and non-injected, sclera clear OROPHARYNX:no exudate, no erythema and lips, buccal mucosa (+) oral thrush  NECK: supple, thyroid normal size, non-tender, without nodularity LYMPH:  no palpable lymphadenopathy in the cervical, axillary or inguinal LUNGS: clear to auscultation and  percussion with normal breathing effort HEART: regular rate & rhythm and no murmurs and no lower extremity edema ABDOMEN:abdomen soft, non-tender and active bowel sounds, no hepatomegaly  Musculoskeletal:no cyanosis of digits and no clubbing  PSYCH: alert & oriented x 3 with fluent speech NEURO: no focal motor/sensory deficits  LABORATORY DATA:  I have reviewed the data as listed CBC Latest Ref Rng & Units 09/19/2017 08/31/2017 08/30/2017   WBC 3.9 - 10.3 K/uL 3.1(L) 3.8(L) 3.6(L)  Hemoglobin 11.6 - 15.9 g/dL 9.9(L) 10.0(L) 9.4(L)  Hematocrit 34.8 - 46.6 % 30.3(L) 30.0(L) 28.9(L)  Platelets 145 - 400 K/uL 178 198 193   CMP Latest Ref Rng & Units 09/19/2017 08/31/2017 08/30/2017  Glucose 70 - 140 mg/dL 72 103 89  BUN 7 - 26 mg/dL 14 11.4 13  Creatinine 0.60 - 1.10 mg/dL 1.10 0.8 0.78  Sodium 136 - 145 mmol/L 135(L) 137 137  Potassium 3.3 - 4.7 mmol/L 3.9 3.4(L) 3.8  Chloride 98 - 109 mmol/L 99 - 103  CO2 22 - 29 mmol/L 22 21(L) 22  Calcium 8.4 - 10.4 mg/dL 9.2 9.3 8.9  Total Protein 6.4 - 8.3 g/dL 6.8 7.1 6.6  Total Bilirubin 0.2 - 1.2 mg/dL 1.6(H) 0.77 0.8  Alkaline Phos 40 - 150 U/L 50 32(L) 26(L)  AST 5 - 34 U/L 45(H) 20 22  ALT 0 - 55 U/L 60(H) 13 12(L)    CA19.9 (0-35U/ml) 07/28/2016: 36 08/18/2016: 51 09/13/2016: 56 10/19/2016: 43 11/09/2016: 44 12/06/2016: 41 01/05/2017: 35 02/02/2017: 37 03/07/17: 43 04/18/17: 66 05/16/17: 111 06/08/17: 119 06/18/17: 107 07/18/17: 161 08/16/17: 272   CA 125 07/25/17: 100.6    PATHOLOGY    Diagnosis 08/05/17  Peritoneum, biopsy, left lower abdomen METASTATIC ADENOCARCINOMA, CONSISTENT WITH CHOLANGIOCARCINOMA. Microscopic Comment The adenocarcinoma stains positive for ck7, negative for ER, GATA 3, ck20, cdx2, and TTF-1. Pax 8 (patchy stain). The morphology and immunostain pattern support the above diagnosis. The block B is sent to [facility] for further molecular testing.   Diagnosis 07/07/2016 FINE NEEDLE ASPIRATION, ENDOSCOPIC, LIVER (SPECIMEN 2 OF 2 COLLECTED 07/07/16): MALIGNANT CELLS CONSISTENT WITH ADENOCARCINOMA.  ADDITIONAL INFORMATION: Per request, immunohistochemistry was performed. The cells are positive for cytokeratin 7 and negative for cytokeratin 20 and CDX-2. This excludes a colorectal primary. Pancreaticobiliary and upper GI sources remain in the differential. Called to Dr. Burr Medico on 07/28/2016.  Diagnosis 07/07/2016 FINE NEEDLE ASPIRATION, ENDOSCOPIC,  GASTRO-HEPATIC LYMPH NODE (SPECIMEN 1 OF 2 COLLECTED 07/07/16): MALIGNANT CELLS CONSISTENT WITH ADENOCARCINOMA, SEE COMMENT. Preliminary Diagnosis Intraoperative Diagnosis: Few atypical glandular clusters - recommend additional material. 4) Adequate ([facility])   PROCEDURES  Upper EUS 07/07/2016 Dr. Ardis Hughs  - Endoscopically normal UGI tract (good views with standard gastroscope) - Vaguely bordered 4cm mass in the caudate lobe of liver that is confluent with what appear to be enlarged gastrohepatic lymphnodes (these may represent direct tumor extension however). Preliminary cytology from the lymphnodes shows malignancy, glandular. Preliminary cytology from the liver mass shows the same. Await final cytology results but this is most suspicious for peripheral intrahepatic cholangiocarcinoma with adjacent malignant adenopathy.  COLONOSCOPY 05/22/2014 ENDOSCOPIC IMPRESSION: 1. Sessile polyp measuring 4 mm in size was found in the sigmoid colon; polypectomy was performed with a cold snare 2. The colon mucosa was otherwise normal    RADIOGRAPHIC STUDIES: I have personally reviewed the radiological images as listed and agreed with the findings in the report.  CT AP WO Contrast 08/08/17 IMPRESSION: 1. No substantial interval change in exam although assessment today limited by lack  of intravenous contrast material. 2. Stomach is distended with fluid, similar to prior. 3. Patient's known caudate liver lesion not well demonstrated on today's exam. 4. Similar appearance large complex cystic and solid mass in the cul-de-sac and left adnexal space. 5. Similar appearance abnormal soft tissue in the anterior left lower quadrant. 6. Fibroid change in the uterus.  CT AP W Contrast 07/30/17 IMPRESSION: 1. No substantial interval change in exam. 2. Complex cystic and solid mass filling the cul-de-sac and left adnexal space is similar to prior. There is abnormal soft tissue in the anterior left lower  quadrant concerning for metastatic disease. 3. Stable appearance of the caudate lesion in this patient with known cholangiocarcinoma. There is confluent necrotic lymphadenopathy in the porta hepatis, similar to prior with substantial mass-effect on the left renal vein as it enters the IVC. 4. Fibroid change in the uterus. 5. Trace intraperitoneal free fluid.  CT CAP W Contrast 07/21/17 IMPRESSION: CT CHEST 1. Interval development of 2 small new pulmonary nodules concerning for progressive, or new metastatic disease. Recommend continued attention on follow-up imaging. 2. The previously noted right upper and left upper lobe pulmonary nodules remain stable in size consistent with treated metastatic disease. 3. Stable position of right IJ approach power injectable port catheter. 4. Coronary artery calcifications. CT ABD/PELVIS 1. Marked interval enlargement of a complex, enhancing, cystic and solid left ovarian mass which now fills the posterior aspect of the pelvis exerting mass effect on the adjacent rectosigmoid colon and bladder. This likely accounts for the patient's lower abdominal pain and sensation of constipation (there is no significant stool burden within the rectum or sigmoid colon). These findings are highly suspicious for a primary ovarian cystic or mucinous adenocarcinoma. 2. Enlarging omental implants in the left lower quadrant highly concerning for metastatic ovarian carcinoma. 3. New/enlarging subcapsular hepatic lesion may represent progressive cholangio or ovarian carcinoma metastases. 4. Enlarging caudate lobe lesion and aortocaval/portacaval confluent necrotic adenopathy concerning for progressive metastatic cholangiocarcinoma. 5. The segment 6 hepatic lesion remains stable or may be smaller on today's examination. These results will be called to the ordering clinician or representative by the Radiologist Assistant, and communication documented in the PACS  or zVision Dashboard.   CT CAP W Contrast, 04/13/17 IMPRESSION: 1. The dominant caudate lobe mass is stable in size and appearance, as is the indistinct adenopathy in the porta hepatis adjacent to the caudate lobe, and in the retroperitoneum. Several subtle additional liver lesions are slightly less conspicuous on today' s exam. The pulmonary nodules are stable, and mild omental nodularity in the left lower quadrant is very slightly worsened compared to prior. Overall the burden of malignancy appears similar to prior, and some of the findings may represent effectively treated metastatic lesions. 2. Complex left adnexal mass, 6.7 by 4.1 cm, enlarging. Pelvic sonography recommended for further characterization. 3. Other imaging findings of potential clinical significance: Aortic Atherosclerosis (ICD10-I70.0). Coronary atherosclerosis. Prominent stool throughout the colon favors constipation. Fibroid uterus.  CT CAP W Contrast 01/25/17 IMPRESSION: 1. Response to therapy. 2. Similar to minimal decrease in pulmonary nodules/metastasis. 3. Improvement in hepatic disease burden. 4. Decrease in abdominal adenopathy. 5. Significantly age advanced atherosclerosis, including within the coronary arteries. 6. Uterine fibroids. 7. Suspect a complex cyst in the left ovary. Recommend attention on follow-up.  CT CAP W CONTRAST 10/18/16 IMPRESSION: 1. Again noted are multifocal liver metastasis. Overall there has been no significant interval change in overall volume of liver metastases. 2. Similar appearance of upper abdominal adenopathy 3.  Slight decrease in size of small pulmonary metastases.   ASSESSMENT & PLAN:  52 y.o. African-American female, without significant past medical history, presented with abdominal pain  1. 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 -We have previously reviewed her case in our GI tumor board. Based on the scans and endoscopy findings, this is most consistent with cholangiocarcinoma. Her liver and inguinal biopsy showed adenocarcinoma, IHC was consistent with pancreatic-biliary primary -She has 2 additional small liver lesions and 2 4-64m lung lesions, which are indeterminate but that is suspicious for metastatic disease. -She was seen by surgeon Dr. BBarry Dieneswho thinks she is a not a candidate for surgical resection due to her metastatic disease. -We previously discussed her cancer is incurable at this stage, the goal of therapy is palliation, to prolong life and improve her quality life -she is on first-line cisplatin and gemcitabine, on day 1, 8 every 21 days.  -She has been tolerating chemotherapy moderately well, but had significant neutropenia and thrombocytopenia, cycle 2 chemotherapy was dose reduced -I previously reviewed her restaging CT scan from 10/19/2016, which showed stable disease overall, no other new lesions. She is clinically doing well, her abdominal pain has much improved. -CT from 5/29 reviewed with pt and it shows excellent partial response to treatment, both liver lesions and adenopathy have decreased in size, small lung nodules are stable, no new lesions.  -I previously reviewed her CT scan from 04/13/2017, which showed a stable disease overall. Her tumor marker CA 19.9 has been slowly trending up, concerning for early resistance. -She has developed prolonged neutropenia, secondary to chemotherapy.  -She started with low dose FOLFOX on 05/16/17, due to her neutropenia. Held oxaliplatin with cycle two due to poor toleration.  -Her CT CAP from 07/21/17 showed new peritoneal carcinomatosis with a large complex ovarian mass, primary ovarian cancer vs metastatic cholangiocarcinoma. I recommend IR biopsy of her peritoneal lesion, checked CA125 and it was 100.6 on  07/25/17.  -Her IR biopsy of LLQ omental implant from 08/05/17 showed metastatic disease consistent with cholangiocarcinoma. [facility] 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  Unfortunately she is not a candidate for immunotherapy based on the MSI, there was one rearrangement in intron is unlikely to predict response to TKI such as crizotinib. She also has ERBB2 amplification, but HER2 FISH was negative, she will unlikely benefit from HER2 antibody such as Herceptin. -She has been on low-dose Capeox for 3 cycles, her overall condition has improved, however she has moderate side effect from Xeloda, her total bilirubin also slightly increased today, likely secondary to Xeloda, I will switch her to FOLFOX. -Labs today (09/19/17) WBC is 3.1 and Hgb is 9.9. Bilirubin is 1.6 and her liver function is slightly elevated. I explained that he urine may be orange due to her increased bilirubin. She can continue with chemo today. I strongly advised her to watch the color of her urine and if it gets much darker, she will need to come in -Her vaginal discharge is likely spotting due to perimenopause and I explained this to her.  -I explained that the cold sensitivity she is experiencing is a symptom of oxaliplatin.  I recommended that she drink warm beverages.  - I prescribed 100 mg fluconazole for thrush for her to take daily for 7 days.  -Scan 2nd week of Feb 2019.  Plan to see Dr. [facility]amon [facility] at [facility] after the scan to discuss clinical trial options. -F/u in 2 weeks   2. Abdominal pain, back pain  --- She has had worsening abdominal pain lately due to cancer progression, now much better controlled  -Continue OxyContin 60 mg every 12 hours, and oxycodone 10 mg every 3 hours as needed for pain  -She also has  small amount of Dilaudid at home, she will use it as needed for severe pain  -she takes Xanax about 3 times a week now. Refilled on 08/16/17 abdominal pain exacerbated by treatment, will post pone treatment 1 week to stabilize her pain.  -Will continue 60 mg of OxyContin every 12 hrs, and oxycodone as needed, her abdominal pain has been well controlled lately.   3. Nausea and anorexia and weight loss -We previously reviewed her nausea management, she will continue Compazine and Zofran -I previously encouraged her to take a nutritional supplement -We previously discussed appetite stimulant, she is on Marinol, dose was increased to 5 mg twice daily last week, appetite improved, will continue. -she will f/u with dietician  -She has tried Marinol, now on Megace, appetite has improved since her pain is controlled -Continue Megace -Nausea controlled with scopolamine patch, PPI, and zofran   4. Neutropenia and anemia -Secondary to chemotherapy, neutropenia much improved with neulasta. -She is taking pre-natal vitamins that contain B-12 which is helpful -Continue monitoring  -She has persistent neutropenia from chemotherapy, we'll continue GCSF after chemo.  -Her Hg is stable at 10.0 and her WBC is 3.6, ANC normal at 1.6 (08/16/17)  5. Goal of care discussion  -We previously discussed the incurable nature of her cancer, and the overall poor prognosis, especially she has had a major disease progression lately, and she has had several minor treatment.  Her prognosis is guarded -The patient understands the goal of care is palliative. -I have previously recommended DNR/DNI, she will think about it.   6. Constipation -I discussed this can worsen with increase in pain medication   -I suggest she can take 4-6 tablets of seneca a day. If not enough she can take half bottle magnesium citrate until she has a BM. She can take seneca at least 2 tablets daily to make sure she has regular BM -She will  continue senokot with or without miralax daily to maintain BM q1-2 days. She will take mag citrate if no BM in 4 days  7. Peripheral Neuropathy, grade 1 -to her feet, likely secondary to chemotherapy.    Plan -discontinue Xeloda due to tolerance issue, switch to FOLFOX with dose reduction -refilled oxycontin today  -I prescribed 100 mg fluconazole for 7 days for oral/pharyngeal thrush -Lab, flush, f/u with me or lacie and chemo FOLFOX in 2 and 4 weeks -IVF 2hrs in 2 days before pump d/c  -CT CAP with contrast in 3-4 weeks for restaging      All questions were answered. The patient knows to call the clinic with any problems, questions or concerns.  I spent 20 minutes counseling the patient face to face. The total time spent in the appointment was 25 minutes and more than 50% was on counseling.  This document serves as a record of services personally performed by Truitt Merle, MD. It was created on her behalf by Theresia Bough,  a trained medical scribe. The creation of this record is based on the scribe's personal observations and the provider's statements to them.   I have reviewed the above documentation for accuracy and completeness, and I agree with the above.    Truitt Merle, MD 09/19/2017

## 2017-09-19 ENCOUNTER — Inpatient Hospital Stay: Payer: Federal, State, Local not specified - PPO | Attending: Hematology

## 2017-09-19 ENCOUNTER — Inpatient Hospital Stay: Payer: Federal, State, Local not specified - PPO

## 2017-09-19 ENCOUNTER — Inpatient Hospital Stay (HOSPITAL_BASED_OUTPATIENT_CLINIC_OR_DEPARTMENT_OTHER): Payer: Federal, State, Local not specified - PPO | Admitting: Hematology

## 2017-09-19 VITALS — BP 125/80 | HR 90 | Temp 98.3°F | Resp 20 | Ht 66.0 in | Wt 112.3 lb

## 2017-09-19 DIAGNOSIS — R8290 Unspecified abnormal findings in urine: Secondary | ICD-10-CM

## 2017-09-19 DIAGNOSIS — C221 Intrahepatic bile duct carcinoma: Secondary | ICD-10-CM

## 2017-09-19 DIAGNOSIS — G893 Neoplasm related pain (acute) (chronic): Secondary | ICD-10-CM

## 2017-09-19 DIAGNOSIS — R634 Abnormal weight loss: Secondary | ICD-10-CM

## 2017-09-19 DIAGNOSIS — C786 Secondary malignant neoplasm of retroperitoneum and peritoneum: Secondary | ICD-10-CM | POA: Diagnosis not present

## 2017-09-19 DIAGNOSIS — Z7189 Other specified counseling: Secondary | ICD-10-CM

## 2017-09-19 DIAGNOSIS — B379 Candidiasis, unspecified: Secondary | ICD-10-CM

## 2017-09-19 DIAGNOSIS — N839 Noninflammatory disorder of ovary, fallopian tube and broad ligament, unspecified: Secondary | ICD-10-CM | POA: Insufficient documentation

## 2017-09-19 DIAGNOSIS — D701 Agranulocytosis secondary to cancer chemotherapy: Secondary | ICD-10-CM | POA: Diagnosis not present

## 2017-09-19 DIAGNOSIS — R630 Anorexia: Secondary | ICD-10-CM

## 2017-09-19 DIAGNOSIS — N898 Other specified noninflammatory disorders of vagina: Secondary | ICD-10-CM | POA: Diagnosis not present

## 2017-09-19 DIAGNOSIS — G620 Drug-induced polyneuropathy: Secondary | ICD-10-CM | POA: Diagnosis not present

## 2017-09-19 DIAGNOSIS — R911 Solitary pulmonary nodule: Secondary | ICD-10-CM

## 2017-09-19 DIAGNOSIS — C7801 Secondary malignant neoplasm of right lung: Principal | ICD-10-CM

## 2017-09-19 DIAGNOSIS — Z5111 Encounter for antineoplastic chemotherapy: Secondary | ICD-10-CM | POA: Diagnosis present

## 2017-09-19 DIAGNOSIS — D6481 Anemia due to antineoplastic chemotherapy: Secondary | ICD-10-CM

## 2017-09-19 DIAGNOSIS — T451X5A Adverse effect of antineoplastic and immunosuppressive drugs, initial encounter: Secondary | ICD-10-CM

## 2017-09-19 DIAGNOSIS — C7800 Secondary malignant neoplasm of unspecified lung: Principal | ICD-10-CM

## 2017-09-19 LAB — COMPREHENSIVE METABOLIC PANEL
ALT: 60 U/L — ABNORMAL HIGH (ref 0–55)
AST: 45 U/L — ABNORMAL HIGH (ref 5–34)
Albumin: 4.1 g/dL (ref 3.5–5.0)
Alkaline Phosphatase: 50 U/L (ref 40–150)
Anion gap: 14 — ABNORMAL HIGH (ref 3–11)
BUN: 14 mg/dL (ref 7–26)
CO2: 22 mmol/L (ref 22–29)
Calcium: 9.2 mg/dL (ref 8.4–10.4)
Chloride: 99 mmol/L (ref 98–109)
Creatinine, Ser: 1.1 mg/dL (ref 0.60–1.10)
GFR calc Af Amer: >60 mL/min (ref >60)
GFR calc non Af Amer: 58 mL/min — ABNORMAL LOW (ref >60)
Glucose, Bld: 72 mg/dL (ref 70–140)
Potassium: 3.9 mmol/L (ref 3.3–4.7)
Sodium: 135 mmol/L — ABNORMAL LOW (ref 136–145)
Total Bilirubin: 1.6 mg/dL — ABNORMAL HIGH (ref 0.2–1.2)
Total Protein: 6.8 g/dL (ref 6.4–8.3)

## 2017-09-19 LAB — CBC WITH DIFFERENTIAL/PLATELET
Basophils Absolute: 0 10*3/uL (ref 0.0–0.1)
Basophils Relative: 0 %
Eosinophils Absolute: 0 10*3/uL (ref 0.0–0.5)
Eosinophils Relative: 0 %
HCT: 30.3 % — ABNORMAL LOW (ref 34.8–46.6)
Hemoglobin: 9.9 g/dL — ABNORMAL LOW (ref 11.6–15.9)
Lymphocytes Relative: 43 %
Lymphs Abs: 1.3 10*3/uL (ref 0.9–3.3)
MCH: 28.3 pg (ref 25.1–34.0)
MCHC: 32.7 g/dL (ref 31.5–36.0)
MCV: 86.6 fL (ref 79.5–101.0)
Monocytes Absolute: 0.2 10*3/uL (ref 0.1–0.9)
Monocytes Relative: 8 %
Neutro Abs: 1.5 10*3/uL (ref 1.5–6.5)
Neutrophils Relative %: 49 %
Platelets: 178 10*3/uL (ref 145–400)
RBC: 3.5 MIL/uL — ABNORMAL LOW (ref 3.70–5.45)
RDW: 16 % (ref 11.2–16.1)
WBC: 3.1 10*3/uL — ABNORMAL LOW (ref 3.9–10.3)

## 2017-09-19 MED ORDER — LEUCOVORIN CALCIUM INJECTION 350 MG
400.0000 mg/m2 | Freq: Once | INTRAVENOUS | Status: AC
  Administered 2017-09-19: 644 mg via INTRAVENOUS
  Filled 2017-09-19: qty 32.2

## 2017-09-19 MED ORDER — DEXTROSE 5 % IV SOLN
40.0000 mg/m2 | Freq: Once | INTRAVENOUS | Status: AC
  Administered 2017-09-19: 65 mg via INTRAVENOUS
  Filled 2017-09-19: qty 13

## 2017-09-19 MED ORDER — DEXTROSE 5 % IV SOLN
Freq: Once | INTRAVENOUS | Status: AC
  Administered 2017-09-19: 12:00:00 via INTRAVENOUS

## 2017-09-19 MED ORDER — SODIUM CHLORIDE 0.9% FLUSH
10.0000 mL | INTRAVENOUS | Status: DC | PRN
  Administered 2017-09-19: 10 mL
  Filled 2017-09-19: qty 10

## 2017-09-19 MED ORDER — OXALIPLATIN CHEMO INJECTION 100 MG/20ML: 85.0000 mg/m2 | Freq: Once | INTRAVENOUS | Status: DC

## 2017-09-19 MED ORDER — FLUCONAZOLE 100 MG PO TABS: 100.0000 mg | ORAL_TABLET | Freq: Every day | ORAL | 0 refills | Status: DC

## 2017-09-19 MED ORDER — OXYCODONE HCL ER 60 MG PO T12A: 60.0000 mg | EXTENDED_RELEASE_TABLET | Freq: Two times a day (BID) | ORAL | 0 refills | Status: DC

## 2017-09-19 MED ORDER — SODIUM CHLORIDE 0.9 % IV SOLN
Freq: Once | INTRAVENOUS | Status: AC
  Administered 2017-09-19: 13:00:00 via INTRAVENOUS
  Filled 2017-09-19: qty 5

## 2017-09-19 MED ORDER — PALONOSETRON HCL INJECTION 0.25 MG/5ML
0.2500 mg | Freq: Once | INTRAVENOUS | Status: AC
  Administered 2017-09-19: 0.25 mg via INTRAVENOUS

## 2017-09-19 MED ORDER — SODIUM CHLORIDE 0.9 % IV SOLN
2000.0000 mg/m2 | INTRAVENOUS | Status: DC
  Administered 2017-09-19: 3200 mg via INTRAVENOUS
  Filled 2017-09-19: qty 64

## 2017-09-19 MED ORDER — PALONOSETRON HCL INJECTION 0.25 MG/5ML
INTRAVENOUS | Status: AC
  Filled 2017-09-19: qty 5

## 2017-09-19 NOTE — Progress Notes (Signed)
Confirmed w/ Dr. Burr Medico Oxaliplatin dose reduce to 40 mg/m2 as previously dosed. No Neulasta at present. Kennith Center, Pharm.D., CPP 09/19/2017@12 :09 PM

## 2017-09-19 NOTE — Patient Instructions (Signed)
[facility] Discharge Instructions for Patients Receiving Chemotherapy  Today you received the following chemotherapy agents: Leucovorin, Oxaliplatin, Adrucil.  To help prevent nausea and vomiting after your treatment, we encourage you to take your nausea medication as prescribed.   If you develop nausea and vomiting that is not controlled by your nausea medication, call the clinic.   BELOW ARE SYMPTOMS THAT SHOULD BE REPORTED IMMEDIATELY:  *FEVER GREATER THAN 100.5 F  *CHILLS WITH OR WITHOUT FEVER  NAUSEA AND VOMITING THAT IS NOT CONTROLLED WITH YOUR NAUSEA MEDICATION  *UNUSUAL SHORTNESS OF BREATH  *UNUSUAL BRUISING OR BLEEDING  TENDERNESS IN MOUTH AND THROAT WITH OR WITHOUT PRESENCE OF ULCERS  *URINARY PROBLEMS  *BOWEL PROBLEMS  UNUSUAL RASH Items with * indicate a potential emergency and should be followed up as soon as possible.  Feel free to call the clinic should you have any questions or concerns. The clinic phone number is [phone].  Please show the [place] at check-in to the Emergency Department and triage nurse.

## 2017-09-19 NOTE — Patient Instructions (Signed)

## 2017-09-20 ENCOUNTER — Encounter: Payer: Self-pay | Admitting: Hematology

## 2017-09-20 LAB — CANCER ANTIGEN 19-9: CA 19-9: 669 U/mL — ABNORMAL HIGH (ref 0–35)

## 2017-09-21 ENCOUNTER — Inpatient Hospital Stay: Payer: Federal, State, Local not specified - PPO

## 2017-09-21 VITALS — BP 116/77 | HR 76 | Temp 98.3°F | Resp 17

## 2017-09-21 DIAGNOSIS — C221 Intrahepatic bile duct carcinoma: Secondary | ICD-10-CM

## 2017-09-21 DIAGNOSIS — C7801 Secondary malignant neoplasm of right lung: Principal | ICD-10-CM

## 2017-09-21 DIAGNOSIS — Z5111 Encounter for antineoplastic chemotherapy: Secondary | ICD-10-CM | POA: Diagnosis not present

## 2017-09-21 MED ORDER — SODIUM CHLORIDE 0.9 % IV SOLN
Freq: Once | INTRAVENOUS | Status: AC
  Administered 2017-09-21: 14:00:00 via INTRAVENOUS

## 2017-09-21 MED ORDER — SODIUM CHLORIDE 0.9% FLUSH
10.0000 mL | INTRAVENOUS | Status: DC | PRN
  Filled 2017-09-21: qty 10

## 2017-09-21 MED ORDER — HEPARIN SOD (PORK) LOCK FLUSH 100 UNIT/ML IV SOLN
500.0000 [IU] | Freq: Once | INTRAVENOUS | Status: AC | PRN
  Administered 2017-09-21: 500 [IU]
  Filled 2017-09-21: qty 5

## 2017-09-21 NOTE — Patient Instructions (Signed)
Dehydration, Adult Dehydration is a condition in which there is not enough fluid or water in the body. This happens when you lose more fluids than you take in. Important organs, such as the kidneys, brain, and heart, cannot function without a proper amount of fluids. Any loss of fluids from the body can lead to dehydration. Dehydration can range from mild to severe. This condition should be treated right away to prevent it from becoming severe. What are the causes? This condition may be caused by:  Vomiting.  Diarrhea.  Excessive sweating, such as from heat exposure or exercise.  Not drinking enough fluid, especially: ? When ill. ? While doing activity that requires a lot of energy.  Excessive urination.  Fever.  Infection.  Certain medicines, such as medicines that cause the body to lose excess fluid (diuretics).  Inability to access safe drinking water.  Reduced physical ability to get adequate water and food.  What increases the risk? This condition is more likely to develop in people:  Who have a poorly controlled long-term (chronic) illness, such as diabetes, heart disease, or kidney disease.  Who are age 65 or older.  Who are disabled.  Who live in a place with high altitude.  Who play endurance sports.  What are the signs or symptoms? Symptoms of mild dehydration may include:  Thirst.  Dry lips.  Slightly dry mouth.  Dry, warm skin.  Dizziness. Symptoms of moderate dehydration may include:  Very dry mouth.  Muscle cramps.  Dark urine. Urine may be the color of tea.  Decreased urine production.  Decreased tear production.  Heartbeat that is irregular or faster than normal (palpitations).  Headache.  Light-headedness, especially when you stand up from a sitting position.  Fainting (syncope). Symptoms of severe dehydration may include:  Changes in skin, such as: ? Cold and clammy skin. ? Blotchy (mottled) or pale skin. ? Skin that does  not quickly return to normal after being lightly pinched and released (poor skin turgor).  Changes in body fluids, such as: ? Extreme thirst. ? No tear production. ? Inability to sweat when body temperature is high, such as in hot weather. ? Very little urine production.  Changes in vital signs, such as: ? Weak pulse. ? Pulse that is more than 100 beats a minute when sitting still. ? Rapid breathing. ? Low blood pressure.  Other changes, such as: ? Sunken eyes. ? Cold hands and feet. ? Confusion. ? Lack of energy (lethargy). ? Difficulty waking up from sleep. ? Short-term weight loss. ? Unconsciousness. How is this diagnosed? This condition is diagnosed based on your symptoms and a physical exam. Blood and urine tests may be done to help confirm the diagnosis. How is this treated? Treatment for this condition depends on the severity. Mild or moderate dehydration can often be treated at home. Treatment should be started right away. Do not wait until dehydration becomes severe. Severe dehydration is an emergency and it needs to be treated in a hospital. Treatment for mild dehydration may include:  Drinking more fluids.  Replacing salts and minerals in your blood (electrolytes) that you may have lost. Treatment for moderate dehydration may include:  Drinking an oral rehydration solution (ORS). This is a drink that helps you replace fluids and electrolytes (rehydrate). It can be found at pharmacies and retail stores. Treatment for severe dehydration may include:  Receiving fluids through an IV tube.  Receiving an electrolyte solution through a feeding tube that is passed through your nose   and into your stomach (nasogastric tube, or NG tube).  Correcting any abnormalities in electrolytes.  Treating the underlying cause of dehydration. Follow these instructions at home:  If directed by your health care provider, drink an ORS: ? Make an ORS by following instructions on the  package. ? Start by drinking small amounts, about  cup (120 mL) every 5-10 minutes. ? Slowly increase how much you drink until you have taken the amount recommended by your health care provider.  Drink enough clear fluid to keep your urine clear or pale yellow. If you were told to drink an ORS, finish the ORS first, then start slowly drinking other clear fluids. Drink fluids such as: ? Water. Do not drink only water. Doing that can lead to having too little salt (sodium) in the body (hyponatremia). ? Ice chips. ? Fruit juice that you have added water to (diluted fruit juice). ? Low-calorie sports drinks.  Avoid: ? Alcohol. ? Drinks that contain a lot of sugar. These include high-calorie sports drinks, fruit juice that is not diluted, and soda. ? Caffeine. ? Foods that are greasy or contain a lot of fat or sugar.  Take over-the-counter and prescription medicines only as told by your health care provider.  Do not take sodium tablets. This can lead to having too much sodium in the body (hypernatremia).  Eat foods that contain a healthy balance of electrolytes, such as bananas, oranges, potatoes, tomatoes, and spinach.  Keep all follow-up visits as told by your health care provider. This is important. Contact a health care provider if:  You have abdominal pain that: ? Gets worse. ? Stays in one area (localizes).  You have a rash.  You have a stiff neck.  You are more irritable than usual.  You are sleepier or more difficult to wake up than usual.  You feel weak or dizzy.  You feel very thirsty.  You have urinated only a small amount of very dark urine over 6-8 hours. Get help right away if:  You have symptoms of severe dehydration.  You cannot drink fluids without vomiting.  Your symptoms get worse with treatment.  You have a fever.  You have a severe headache.  You have vomiting or diarrhea that: ? Gets worse. ? Does not go away.  You have blood or green matter  (bile) in your vomit.  You have blood in your stool. This may cause stool to look black and tarry.  You have not urinated in 6-8 hours.  You faint.  Your heart rate while sitting still is over 100 beats a minute.  You have trouble breathing. This information is not intended to replace advice given to you by your health care provider. Make sure you discuss any questions you have with your health care provider. Document Released: 08/16/2005 Document Revised: 03/12/2016 Document Reviewed: 10/10/2015 Elsevier Interactive Patient Education  2018 [employer]  

## 2017-09-22 ENCOUNTER — Telehealth: Payer: Self-pay | Admitting: *Deleted

## 2017-09-22 NOTE — Telephone Encounter (Signed)
Pt called reporting having sore throat, and requested a call back from nurse.  Spoke with pt, and was informed that pt is still taking Diflucan for mouth thrush.  Stated symptom almost resolved with med.   Husband was diagnosed with a cold by his PCP yesterday.  Pt now has sore throat.  Wanted to know if sore throat related to thrush, or she might have a cold. Pt denied fever, denied congestion, able to eat and drink fine.   Instructed pt to gargle with warm salt water, or baking soda mouthwash.   Dr. Burr Medico notified, and agreed with nurse's recommendations to pt.   Spoke with pt again and informed her to call office back if she develops congestion, or has fever.   Pt voiced understanding. Pt's    Phone     [phone].

## 2017-10-01 ENCOUNTER — Other Ambulatory Visit: Payer: Self-pay | Admitting: Medical

## 2017-10-01 DIAGNOSIS — R110 Nausea: Secondary | ICD-10-CM

## 2017-10-01 DIAGNOSIS — R630 Anorexia: Secondary | ICD-10-CM

## 2017-10-02 NOTE — Progress Notes (Signed)
[place]  [phone]) [phone] [phone]) [phone]  Clinic Follow up Note   Patient Care Team: Carollee Herter, Alferd Apa, DO as PCP - General Pyrtle, Lajuan Lines, MD as Consulting Physician (Gastroenterology) 10/03/2017  CHIEF COMPLAINT: Follow up metastatic intrahepatic cholangiocarcinoma  SUMMARY OF ONCOLOGIC HISTORY: Oncology History   Metastatic cholangiocarcinoma to lung [facility])   Staging form: Perihilar Bile Ducts, AJCC 7th Edition   - Clinical stage from 07/07/2016: Stage IVB (T2b, N1, M1) - Signed by Truitt Merle, MD on 07/28/2016      Metastatic cholangiocarcinoma to lung ([place])   05/22/2014 Procedure    Routine screening colonoscopy showed a sessile polyp measuring 4 mm in the sigmoid colon, removed, otherwise negative.      06/29/2016 Imaging    CT chest, abdomen and pelvis showed 2 indeterminant nodule in left and the right lung, 4-48mm, indeterminate a hypovascular liver lesions largest in the caudate lobe measuring 2.6 cm, mild abdominal lymphadenopathy in the gastrohepatic ligament and portocaval space.      07/02/2016 Imaging    Abdominal MRI with and without contrast showed 2 lobular lesions in the caudate lobe, and is regular lymph nodes in the gastrohepatic ligament which is adjacent to the liver lesion, suspicious for malignancy. 2 small lesions in the right hepatic lobe are indeterminate, but concerning for metastasis.      07/07/2016 Procedure    EGD was negative, EUS biopsy of the liver and adjacent lymph node      07/07/2016 Initial Diagnosis    Metastatic cholangiocarcinoma to lung ([place])      07/07/2016 Initial Biopsy    Fine-needle aspiration of the liver lesion in caudate lobe and adjacent lymph nodes both showed metastatic adenocarcinoma.      08/11/2016 - 04/25/2017 Chemotherapy    Cisplatin 25 mg/m, gemcitabine 1000 mg/m, on day one and 8, every 21 days   Cisplatin and gemcitabine on Day 1, 8 every 21 days, with neulasta on day 9,  Gemcitabine dosed reduced to 800 mg/m due to cytopenias, increased to 900mg /m2 on 12/06/2016. Changes to gemcitabine maintenance therapy on day 1 and 8 every 21 days, from 04/18/2017, gemcitabine was reduced dose to 400 mg/m on 04/25/17       10/18/2016 Imaging    CT CAP  IMPRESSION: 1. Again noted are multifocal liver metastasis. Overall there has been no significant interval change in overall volume of liver metastases. 2. Similar appearance of upper abdominal adenopathy 3. Slight decrease in size of small pulmonary metastases.      01/25/2017 Imaging    CT CAP W Contrast 01/25/17 IMPRESSION: 1. Response to therapy. 2. Similar to minimal decrease in pulmonary nodules/metastasis. 3. Improvement in hepatic disease burden. 4. Decrease in abdominal adenopathy. 5. Significantly age advanced atherosclerosis, including within the coronary arteries. 6. Uterine fibroids. 7. Suspect a complex cyst in the left ovary. Recommend attention on follow-up.       04/13/2017 Imaging    CT CAP W Contrast IMPRESSION: 1. The dominant caudate lobe mass is stable in size and appearance, as is the indistinct adenopathy in the porta hepatis adjacent to the caudate lobe, and in the retroperitoneum. Several subtle additional liver lesions are slightly less conspicuous on today' s exam. The pulmonary nodules are stable, and mild omental nodularity in the left lower quadrant is very slightly worsened compared to prior. Overall the burden of malignancy appears similar to prior, and some of the findings may represent effectively treated metastatic lesions. 2. Complex left adnexal mass, 6.7  by 4.1 cm, enlarging. Pelvic sonography recommended for further characterization. 3. Other imaging findings of potential clinical significance: Aortic Atherosclerosis (ICD10-I70.0). Coronary atherosclerosis. Prominent stool throughout the colon favors constipation. Fibroid uterus.      05/16/2017 - 06/27/2017 Chemotherapy     Second-line chemo FOLFOX every 2 weeks with neulasta after pump DC. She will started with low dose FOLFOX on 05/16/17, due to her neutropenia. Held oxaliplatin with cycle two due to poor toleration. Discontinued after cycle 3 (06/27/17) due to disease progression.       07/20/2017 Imaging    IMPRESSION: CT CHEST  1. Interval development of 2 small new pulmonary nodules concerning for progressive, or new metastatic disease. Recommend continued attention on follow-up imaging. 2. The previously noted right upper and left upper lobe pulmonary nodules remain stable in size consistent with treated metastatic disease. 3. Stable position of right IJ approach power injectable port catheter. 4. Coronary artery calcifications. CT ABD/PELVIS  1. Marked interval enlargement of a complex, enhancing, cystic and solid left ovarian mass which now fills the posterior aspect of the pelvis exerting mass effect on the adjacent rectosigmoid colon and bladder. This likely accounts for the patient's lower abdominal pain and sensation of constipation (there is no significant stool burden within the rectum or sigmoid colon). These findings are highly suspicious for a primary ovarian cystic or mucinous adenocarcinoma. 2. Enlarging omental implants in the left lower quadrant highly concerning for metastatic ovarian carcinoma. 3. New/enlarging subcapsular hepatic lesion may represent progressive cholangio or ovarian carcinoma metastases. 4. Enlarging caudate lobe lesion and aortocaval/portacaval confluent necrotic adenopathy concerning for progressive metastatic cholangiocarcinoma. 5. The segment 6 hepatic lesion remains stable or may be smaller on today's examination. These results will be called to the ordering clinician or representative by the Radiologist Assistant, and communication documented in the PACS or zVision Dashboard.        07/30/2017 Imaging     CT AP W Contrast  07/30/17 IMPRESSION: 1. No substantial interval change in exam. 2. Complex cystic and solid mass filling the cul-de-sac and left adnexal space is similar to prior. There is abnormal soft tissue in the anterior left lower quadrant concerning for metastatic disease. 3. Stable appearance of the caudate lesion in this patient with known cholangiocarcinoma. There is confluent necrotic lymphadenopathy in the porta hepatis, similar to prior with substantial mass-effect on the left renal vein as it enters the IVC. 4. Fibroid change in the uterus. 5. Trace intraperitoneal free fluid.      08/05/2017 Pathology Results    Diagnosis 08/05/17  Peritoneum, biopsy, left lower abdomen METASTATIC ADENOCARCINOMA, CONSISTENT WITH CHOLANGIOCARCINOMA. Microscopic Comment      08/08/2017 Imaging    CT AP WO Contrast 08/08/17 IMPRESSION: 1. No substantial interval change in exam although assessment today limited by lack of intravenous contrast material. 2. Stomach is distended with fluid, similar to prior. 3. Patient's known caudate liver lesion not well demonstrated on today's exam. 4. Similar appearance large complex cystic and solid mass in the cul-de-sac and left adnexal space. 5. Similar appearance abnormal soft tissue in the anterior left lower quadrant. 6. Fibroid change in the uterus.      08/16/2017 -  Chemotherapy    CAPOX: Xeloda 1500mg  BID for 1 week on and 1 week off with low dose oxaliplatin starting 08/16/17       CURRENT THERAPY: Third line CAPOX: Xeloda 1500mg  BID for 1 week on and 1 week off with low dose oxaliplatin every 2 weeks starting 08/16/17. Stopped  Xeloda 09/19/17 and start 5-FU pump.   INTERVAL HISTORY: Ms. Marchesi returns for follow up as scheduled prior to next cycle FOLFOX, last treated 09/19/17. Has periodic fatigue but able to complete necessary activities. Today has abdominal cramping she attributes to being nervous for her office visit and chemo. She is  constipated, last BM 2 days ago. She alternates miralax and sennakot daily; took miralax this morning. She has had nausea and occasional vomiting every other day for past 2 weeks. Emesis x2 in past 2 days. Nausea is increased when changing positions quickly or she drinks cold liquid before drinking room temp or warm liquids. Wears scopolamine patch and takes oral zofran PRN which helps. Appetite and po intake are good on megace. Denies other specific complaints today.  REVIEW OF SYSTEMS:   Constitutional: Denies fevers, chills or abnormal weight loss (+) fatigue (+) good po intake and appetite, on megace  Eyes: Denies blurriness of vision Ears, nose, mouth, throat, and face: Denies mucositis or sore throat Respiratory: Denies cough, dyspnea or wheezes Cardiovascular: Denies palpitation, chest discomfort or lower extremity swelling Gastrointestinal:  Denies heartburn or change in bowel habits (+) abdominal cramping (+) constipation, last BM 2 days ago; alternates miralax and sennakot (+) intermittent n/v, on scopolamine and zofran  GU: (+) urine periodically dark  Skin: Denies abnormal skin rashes (+) occasional facial itching  Lymphatics: Denies new lymphadenopathy or easy bruising Neurological:Denies numbness, tingling or new weaknesses Behavioral/Psych: Mood is stable, no new changes (+) anxiety  All other systems were reviewed with the patient and are negative.  MEDICAL HISTORY:  Past Medical History:  Diagnosis Date  . Allergy    SEASONAL  . Anemia    [employer]  . Atopic eczema   . cholangio ca dx'd 07/12/16  . Fibrocystic breast disease   . GERD (gastroesophageal reflux disease)   . Hx of migraines    seasonal   . Personal history of chemotherapy   . Pneumonia    6 years ago   . TIA (transient ischemic attack)     SURGICAL HISTORY: Past Surgical History:  Procedure Laterality Date  . EUS N/A 07/07/2016   Procedure: UPPER ENDOSCOPIC ULTRASOUND (EUS) RADIAL;  Surgeon:  [name], MD;  Location: [facility] ENDOS[facility];  Service: Endoscopy;  Laterality: N/A;  . IR GENERIC HISTORICAL  08/03/2016   IR US GUIDE VASC ACCESS RIGHT 08/03/2016 Corrie Mckusick, DO [facility]  . IR GENERIC HISTORICAL  08/03/2016   IR FLUORO GUIDE PORT INSERTION RIGHT 08/03/2016 Corrie Mckusick, DO [facility]  . MYOMECTOMY  2002    I have reviewed the social history and family history with the patient and they are unchanged from previous note.  ALLERGIES:  is allergic to penicillins; contrast media [iodinated diagnostic agents]; and lipitor [atorvastatin].  MEDICATIONS:  Current Outpatient Medications  Medication Sig Dispense Refill  . ALPRAZolam (XANAX) 0.5 MG tablet Take 1 tablet (0.5 mg total) by mouth 3 (three) times daily as needed for anxiety. 30 tablet 0  . aspirin 81 MG tablet Take 81 mg by mouth daily.    . cetirizine (ZYRTEC) 10 MG tablet Take 10 mg by mouth daily as needed for allergies. For allergies     . Cholecalciferol (VITAMIN D3) 5000 UNITS CAPS Take 5,000 Units by mouth daily.     . COLOSTRUM PO Take 1.25 g by mouth 2 (two) times daily.     Marland Kitchen dicyclomine (BENTYL) 20 MG tablet Take 20 mg by mouth 3 (three) times daily as  needed for spasms.     Marland Kitchen dronabinol (MARINOL) 5 MG capsule Take 1 capsule (5 mg total) by mouth 2 (two) times daily before a meal. 60 capsule 1  . fluconazole (DIFLUCAN) 100 MG tablet Take 1 tablet (100 mg total) by mouth daily. 7 tablet 0  . HYDROmorphone (DILAUDID) 2 MG tablet Take 1-2 tablets (2-4 mg total) by mouth every 4 (four) hours as needed for severe pain. 60 tablet 0  . lidocaine-prilocaine (EMLA) cream Apply 1 application topically as needed. Apply to portacath 1 1/2 hours - 2 hours prior to procedures as needed. 30 g 1  . LORazepam (ATIVAN) 0.5 MG tablet Place under tongue and dissolve every 8 hours as needed for nausea. Do not take Xanax while taking this medication. 30 tablet 0  . magnesium hydroxide (MILK OF MAGNESIA) 400 MG/5ML suspension  Take 30 mLs by mouth daily as needed for mild constipation.    . megestrol (MEGACE) 400 MG/10ML suspension Take 10 mLs (400 mg total) by mouth daily. 240 mL 0  . Menaquinone-7 (VITAMIN K2) 40 MCG TABS Take 40 mg by mouth daily.     . mometasone (ELOCON) 0.1 % lotion Apply topically daily. 60 mL 0  . OLANZapine (ZYPREXA) 2.5 MG tablet Take 1 tablet (2.5 mg total) by mouth at bedtime. 30 tablet 1  . omeprazole (PRILOSEC) 40 MG capsule Take 1 capsule (40 mg total) by mouth 2 (two) times daily before a meal. 60 capsule 1  . ondansetron (ZOFRAN) 8 MG tablet Take 1 tablet (8 mg total) by mouth every 8 (eight) hours as needed for nausea or vomiting. 30 tablet 3  . oxyCODONE (OXYCONTIN) 60 MG 12 hr tablet Take 60 mg by mouth every 12 (twelve) hours. 60 each 0  . Oxycodone HCl 10 MG TABS Take 1 tablet (10 mg total) by mouth every 3 (three) hours as needed. 100 tablet 0  . polyethylene glycol (MIRALAX / GLYCOLAX) packet Take 17 g by mouth 2 (two) times daily. Hold if you develop diarrhea 100 each 0  . promethazine (PHENERGAN) 25 MG tablet Take 1 tablet (25 mg total) by mouth every 6 (six) hours as needed for nausea or vomiting. 40 tablet 0  . scopolamine (TRANSDERM-SCOP) 1 MG/3DAYS Place 1 patch (1.5 mg total) every 3 (three) days onto the skin. 10 patch 2  . senna (SENOKOT) 8.6 MG TABS tablet Take 2 tablets (17.2 mg total) by mouth 2 (two) times daily. 120 each 0   No current facility-administered medications for this visit.    Facility-Administered Medications Ordered in Other Visits  Medication Dose Route Frequency Provider Last Rate Last Dose  . alteplase (CATHFLO ACTIVASE) injection 2 mg  2 mg Intracatheter Once PRN Truitt Merle, MD      . heparin lock flush 100 unit/mL  250 Units Intracatheter Once PRN Truitt Merle, MD      . heparin lock flush 100 unit/mL  250 Units Intracatheter Once PRN Truitt Merle, MD      . ondansetron [facility]) injection 8 mg  8 mg Intravenous Once Truitt Merle, MD      . sodium chloride  flush (NS) 0.9 % injection 10 mL  10 mL Intracatheter PRN Truitt Merle, MD   10 mL at 09/09/16 1645  . sodium chloride flush (NS) 0.9 % injection 10 mL  10 mL Intravenous PRN Truitt Merle, MD   10 mL at 02/11/17 1515  . sodium chloride flush (NS) 0.9 % injection 10 mL  10 mL Intracatheter  PRN Truitt Merle, MD        PHYSICAL EXAMINATION: ECOG PERFORMANCE STATUS: 2 - Symptomatic, <50% confined to bed  Vitals:   10/03/17 1224  BP: 122/80  Pulse: 90  Resp: 18  Temp: 98.7 F (37.1 C)  SpO2: 100%   Filed Weights   10/03/17 1224  Weight: 112 lb (50.8 kg)    GENERAL:alert, no distress and comfortable SKIN: skin color, texture, turgor are normal, no rashes or significant lesions EYES: normal, Conjunctiva are pink and non-injected, sclera clear OROPHARYNX:no exudate, no erythema and lips, buccal mucosa, and tongue normal  NECK: supple, thyroid normal size, non-tender, without nodularity LYMPH:  no palpable cervical, supraclavicular, axillary, or inguinal lymphadenopathy LUNGS: clear to auscultation bilaterally with normal breathing effort HEART: regular rate & rhythm and no murmurs and no lower extremity edema ABDOMEN:abdomen soft, flat, and normal bowel sounds (+) mild tenderness to palpation RUQ Musculoskeletal:no cyanosis of digits and no clubbing  NEURO: alert & oriented x 3 with fluent speech, no focal motor/sensory deficits PAC without erythema   LABORATORY DATA:  I have reviewed the data as listed CBC Latest Ref Rng & Units 10/03/2017 09/19/2017 08/31/2017  WBC 3.9 - 10.3 K/uL 2.5(L) 3.1(L) 3.8(L)  Hemoglobin 11.6 - 15.9 g/dL 9.8(L) 9.9(L) 10.0(L)  Hematocrit 34.8 - 46.6 % 29.3(L) 30.3(L) 30.0(L)  Platelets 145 - 400 K/uL 138(L) 178 198     CMP Latest Ref Rng & Units 10/03/2017 09/19/2017 08/31/2017  Glucose 70 - 140 mg/dL 88 72 103  BUN 7 - 26 mg/dL 11 14 11.4  Creatinine 0.60 - 1.10 mg/dL 1.44(H) 1.10 0.8  Sodium 136 - 145 mmol/L 135(L) 135(L) 137  Potassium 3.5 - 5.1 mmol/L 3.1(L) 3.9  3.4(L)  Chloride 98 - 109 mmol/L 96(L) 99 -  CO2 22 - 29 mmol/L 27 22 21(L)  Calcium 8.4 - 10.4 mg/dL 9.2 9.2 9.3  Total Protein 6.4 - 8.3 g/dL 6.7 6.8 7.1  Total Bilirubin 0.2 - 1.2 mg/dL 2.0(H) 1.6(H) 0.77  Alkaline Phos 40 - 150 U/L 53 50 32(L)  AST 5 - 34 U/L 68(H) 45(H) 20  ALT 0 - 55 U/L 77(H) 60(H) 13   CA19.9 (0-35U/ml) 07/28/2016: 36 08/18/2016: 51 09/13/2016: 56 10/19/2016: 43 11/09/2016: 44 12/06/2016: 41 01/05/2017: 35 02/02/2017: 37 03/07/17: 43 04/18/17: 66 05/16/17: 111 06/08/17: 119 06/18/17: 107 07/18/17: 161 08/16/17: 272 09/19/17: 669   CA 125 07/25/17: 100.6    PATHOLOGY    Diagnosis 08/05/17  Peritoneum, biopsy, left lower abdomen METASTATIC ADENOCARCINOMA, CONSISTENT WITH CHOLANGIOCARCINOMA. Microscopic Comment The adenocarcinoma stains positive for ck7, negative for ER, GATA 3, ck20, cdx2, and TTF-1. Pax 8 (patchy stain). The morphology and immunostain pattern support the above diagnosis. The block B is sent to [facility] for further molecular testing.   Diagnosis 07/07/2016 FINE NEEDLE ASPIRATION, ENDOSCOPIC, LIVER (SPECIMEN 2 OF 2 COLLECTED 07/07/16): MALIGNANT CELLS CONSISTENT WITH ADENOCARCINOMA.  ADDITIONAL INFORMATION: Per request, immunohistochemistry was performed. The cells are positive for cytokeratin 7 and negative for cytokeratin 20 and CDX-2. This excludes a colorectal primary. Pancreaticobiliary and upper GI sources remain in the differential. Called to Dr. Burr Medico on 07/28/2016.  Diagnosis 07/07/2016 FINE NEEDLE ASPIRATION, ENDOSCOPIC, GASTRO-HEPATIC LYMPH NODE (SPECIMEN 1 OF 2 COLLECTED 07/07/16): MALIGNANT CELLS CONSISTENT WITH ADENOCARCINOMA, SEE COMMENT. Preliminary Diagnosis Intraoperative Diagnosis: Few atypical glandular clusters - recommend additional material. 4) Adequate ([facility])   PROCEDURES  Upper EUS 07/07/2016 Dr. Ardis Hughs  - Endoscopically normal UGI tract (good views with standard gastroscope) - Vaguely  bordered 4cm mass  in the caudate lobe of liver that is confluent with what appear to be enlarged gastrohepatic lymphnodes (these may represent direct tumor extension however). Preliminary cytology from the lymphnodes shows malignancy, glandular. Preliminary cytology from the liver mass shows the same. Await final cytology results but this is most suspicious for peripheral intrahepatic cholangiocarcinoma with adjacent malignant adenopathy.  COLONOSCOPY 05/22/2014 ENDOSCOPIC IMPRESSION: 1. Sessile polyp measuring 4 mm in size was found in the sigmoid colon; polypectomy was performed with a cold snare 2. The colon mucosa was otherwise normal     RADIOGRAPHIC STUDIES: I have personally reviewed the radiological images as listed and agreed with the findings in the report. No results found.   ASSESSMENT & PLAN: 52 y.o. African-American female, without significant past medical history, presented with abdominal pain  1. Metastatic intrahepatic cholangiocarcinoma, with probable node and lung metastasis, cT2bN1M1, stage IV -S/p cycle 1 FOLFOX on 1/21; most recently discontinued 3rd line CAPOX due to moderate side effects, hyperbilirubinemia, and elevated LFTs -She has persistent fatigue, nausea, vomiting, and abdominal cramps, and constipation after cycle 1. -Will hold treatment today due to side effects and labs today; she has elevated creatinine Cr 1.44 and hyperbilirubinemia bili 2.0 -Dr. Burr Medico ordered restaging CT to be done after this cycle; however, given worsening labs, elevated CA 19-9, and symptoms, will obtain CT CAP this week. Pending insurance approval   2. Abdominal pain, back pain -Denies overt abdominal pain today but attributes abdominal cramping to anxiety today, -I refilled ativan, she can use for anticipatory anxiety as well -She denies back pain today -Continue current regimen oxycontin 60 mg q12, oxycodone PRN  3. Nausea, anorexia, weight loss -Scopolamine patch and PRN  zofran on board for anti-emetics -compazine and phenergan do not help much -I recommend ativan PRN, refilled today -She will take short course decadron 4 mg x3-5 days to see if this improves nausea -Will get IVF today with IV zofran -appetite improved on megace, continue  -Weight is stable, will monitor  4. Neutropenia, anemia -Secondary to chemotherapy -ANC 0.7 today; Anemia stable Hgb 9.8; did not get neulasta with cycle 1 -Will hold cycle 2 FOLFOX for neutropenia; we reviewed neutropenic precautions   5. Constipation -She currently alternates miralax and sennakot daily; took miralax this morning; no BM in 2 days -I suggest she take another dose of mirlax tonight and continue daily; if not BM add mag citrate  6. Peripheral neuropathy, G1 -to her feet, stable  7. Hypokalemia  -likely due to vomiting; will replace today -Do to persistent n/v, I do not think she will tolerate large pill very well, will give 20 mEq IV today with NS  8. Elevated creatitine  -could be related to dehydration secondary to n/v -I encouraged her to drink more, will give 1 L IVF today   PLAN -labs reviewed, hold FOLFOX chemo today for neutropenia, elevated creatinine, and hyperbilirubinemia -r/s CT CAP to be done this week, pending insurance approval -IVF 1 L NS + 20 K, IV zofran for supportive care -increase miralax, add mag citrate PRN for constipation -continue scopolamine patch and PRN zofran for nausea; add ativan PRN, Refilled today -decadron 4 mg daily x3-5 days for fatigue, decreased po, and n/v  All questions were answered. The patient knows to call the clinic with any problems, questions or concerns. No barriers to learning was detected. I spent 20 minutes counseling the patient face to face. The total time spent in the appointment was 25 minutes and more than 50% was on  counseling and review of test results     Alla Feeling, NP 10/03/17

## 2017-10-03 ENCOUNTER — Inpatient Hospital Stay: Payer: Federal, State, Local not specified - PPO

## 2017-10-03 ENCOUNTER — Other Ambulatory Visit: Payer: Self-pay | Admitting: Emergency Medicine

## 2017-10-03 ENCOUNTER — Inpatient Hospital Stay: Payer: Federal, State, Local not specified - PPO | Attending: Hematology

## 2017-10-03 ENCOUNTER — Telehealth: Payer: Self-pay | Admitting: Hematology

## 2017-10-03 ENCOUNTER — Inpatient Hospital Stay (HOSPITAL_BASED_OUTPATIENT_CLINIC_OR_DEPARTMENT_OTHER): Payer: Federal, State, Local not specified - PPO | Admitting: Nurse Practitioner

## 2017-10-03 ENCOUNTER — Encounter: Payer: Self-pay | Admitting: Nurse Practitioner

## 2017-10-03 ENCOUNTER — Ambulatory Visit: Payer: Federal, State, Local not specified - PPO | Admitting: Nutrition

## 2017-10-03 VITALS — BP 122/80 | HR 90 | Temp 98.7°F | Resp 18 | Ht 66.0 in | Wt 112.0 lb

## 2017-10-03 DIAGNOSIS — R109 Unspecified abdominal pain: Secondary | ICD-10-CM | POA: Diagnosis not present

## 2017-10-03 DIAGNOSIS — K5900 Constipation, unspecified: Secondary | ICD-10-CM | POA: Insufficient documentation

## 2017-10-03 DIAGNOSIS — K831 Obstruction of bile duct: Secondary | ICD-10-CM | POA: Diagnosis not present

## 2017-10-03 DIAGNOSIS — Z5111 Encounter for antineoplastic chemotherapy: Secondary | ICD-10-CM | POA: Diagnosis present

## 2017-10-03 DIAGNOSIS — F419 Anxiety disorder, unspecified: Secondary | ICD-10-CM | POA: Diagnosis not present

## 2017-10-03 DIAGNOSIS — C7801 Secondary malignant neoplasm of right lung: Secondary | ICD-10-CM

## 2017-10-03 DIAGNOSIS — D6481 Anemia due to antineoplastic chemotherapy: Secondary | ICD-10-CM

## 2017-10-03 DIAGNOSIS — E876 Hypokalemia: Secondary | ICD-10-CM | POA: Insufficient documentation

## 2017-10-03 DIAGNOSIS — Z95828 Presence of other vascular implants and grafts: Secondary | ICD-10-CM

## 2017-10-03 DIAGNOSIS — D702 Other drug-induced agranulocytosis: Secondary | ICD-10-CM

## 2017-10-03 DIAGNOSIS — C221 Intrahepatic bile duct carcinoma: Secondary | ICD-10-CM | POA: Insufficient documentation

## 2017-10-03 DIAGNOSIS — E860 Dehydration: Secondary | ICD-10-CM

## 2017-10-03 DIAGNOSIS — D701 Agranulocytosis secondary to cancer chemotherapy: Secondary | ICD-10-CM | POA: Insufficient documentation

## 2017-10-03 DIAGNOSIS — R110 Nausea: Secondary | ICD-10-CM | POA: Diagnosis not present

## 2017-10-03 DIAGNOSIS — C786 Secondary malignant neoplasm of retroperitoneum and peritoneum: Secondary | ICD-10-CM | POA: Insufficient documentation

## 2017-10-03 DIAGNOSIS — C7800 Secondary malignant neoplasm of unspecified lung: Secondary | ICD-10-CM | POA: Diagnosis not present

## 2017-10-03 DIAGNOSIS — R7989 Other specified abnormal findings of blood chemistry: Secondary | ICD-10-CM

## 2017-10-03 LAB — COMPREHENSIVE METABOLIC PANEL
ALT: 77 U/L — ABNORMAL HIGH (ref 0–55)
AST: 68 U/L — ABNORMAL HIGH (ref 5–34)
Albumin: 4.1 g/dL (ref 3.5–5.0)
Alkaline Phosphatase: 53 U/L (ref 40–150)
Anion gap: 12 — ABNORMAL HIGH (ref 3–11)
BUN: 11 mg/dL (ref 7–26)
CO2: 27 mmol/L (ref 22–29)
Calcium: 9.2 mg/dL (ref 8.4–10.4)
Chloride: 96 mmol/L — ABNORMAL LOW (ref 98–109)
Creatinine, Ser: 1.44 mg/dL — ABNORMAL HIGH (ref 0.60–1.10)
GFR calc Af Amer: 48 mL/min — ABNORMAL LOW (ref >60)
GFR calc non Af Amer: 42 mL/min — ABNORMAL LOW (ref >60)
Glucose, Bld: 88 mg/dL (ref 70–140)
Potassium: 3.1 mmol/L — ABNORMAL LOW (ref 3.5–5.1)
Sodium: 135 mmol/L — ABNORMAL LOW (ref 136–145)
Total Bilirubin: 2 mg/dL — ABNORMAL HIGH (ref 0.2–1.2)
Total Protein: 6.7 g/dL (ref 6.4–8.3)

## 2017-10-03 LAB — CBC WITH DIFFERENTIAL/PLATELET
Basophils Absolute: 0 10*3/uL (ref 0.0–0.1)
Basophils Relative: 0 %
Eosinophils Absolute: 0 10*3/uL (ref 0.0–0.5)
Eosinophils Relative: 0 %
HCT: 29.3 % — ABNORMAL LOW (ref 34.8–46.6)
Hemoglobin: 9.8 g/dL — ABNORMAL LOW (ref 11.6–15.9)
Lymphocytes Relative: 60 %
Lymphs Abs: 1.5 10*3/uL (ref 0.9–3.3)
MCH: 28.4 pg (ref 25.1–34.0)
MCHC: 33.4 g/dL (ref 31.5–36.0)
MCV: 84.9 fL (ref 79.5–101.0)
Monocytes Absolute: 0.3 10*3/uL (ref 0.1–0.9)
Monocytes Relative: 11 %
Neutro Abs: 0.7 10*3/uL — ABNORMAL LOW (ref 1.5–6.5)
Neutrophils Relative %: 29 %
Platelets: 138 10*3/uL — ABNORMAL LOW (ref 145–400)
RBC: 3.45 MIL/uL — ABNORMAL LOW (ref 3.70–5.45)
RDW: 16 % — ABNORMAL HIGH (ref 11.2–14.5)
WBC: 2.5 10*3/uL — ABNORMAL LOW (ref 3.9–10.3)

## 2017-10-03 MED ORDER — DEXAMETHASONE SODIUM PHOSPHATE 10 MG/ML IJ SOLN
10.0000 mg | Freq: Once | INTRAMUSCULAR | Status: AC
  Administered 2017-10-03: 10 mg via INTRAVENOUS

## 2017-10-03 MED ORDER — HEPARIN SOD (PORK) LOCK FLUSH 100 UNIT/ML IV SOLN
250.0000 [IU] | Freq: Once | INTRAVENOUS | Status: DC | PRN
  Filled 2017-10-03: qty 5

## 2017-10-03 MED ORDER — SODIUM CHLORIDE 0.9 % IV SOLN: Freq: Once | INTRAVENOUS | Status: DC

## 2017-10-03 MED ORDER — SODIUM CHLORIDE 0.9% FLUSH
10.0000 mL | INTRAVENOUS | Status: DC | PRN
  Administered 2017-10-03: 10 mL
  Filled 2017-10-03: qty 10

## 2017-10-03 MED ORDER — DEXAMETHASONE SODIUM PHOSPHATE 10 MG/ML IJ SOLN
INTRAMUSCULAR | Status: AC
  Filled 2017-10-03: qty 1

## 2017-10-03 MED ORDER — HEPARIN SOD (PORK) LOCK FLUSH 100 UNIT/ML IV SOLN
500.0000 [IU] | Freq: Once | INTRAVENOUS | Status: AC
  Administered 2017-10-03: 500 [IU] via INTRAVENOUS
  Filled 2017-10-03: qty 5

## 2017-10-03 MED ORDER — SODIUM CHLORIDE 0.9% FLUSH
10.0000 mL | INTRAVENOUS | Status: DC | PRN
  Administered 2017-10-03: 10 mL via INTRAVENOUS
  Filled 2017-10-03: qty 10

## 2017-10-03 MED ORDER — ONDANSETRON HCL 4 MG/2ML IJ SOLN
INTRAMUSCULAR | Status: AC
  Filled 2017-10-03: qty 4

## 2017-10-03 MED ORDER — ONDANSETRON HCL 4 MG/2ML IJ SOLN
8.0000 mg | Freq: Once | INTRAMUSCULAR | Status: AC
  Administered 2017-10-03: 8 mg via INTRAVENOUS

## 2017-10-03 MED ORDER — SODIUM CHLORIDE 0.9 % IV SOLN
Freq: Once | INTRAVENOUS | Status: AC
  Administered 2017-10-03: 14:00:00 via INTRAVENOUS
  Filled 2017-10-03: qty 1000

## 2017-10-03 MED ORDER — LORAZEPAM 0.5 MG PO TABS: ORAL_TABLET | ORAL | 0 refills | Status: AC

## 2017-10-03 MED ORDER — PREDNISONE 50 MG PO TABS: ORAL_TABLET | ORAL | 0 refills | Status: DC

## 2017-10-03 NOTE — Telephone Encounter (Signed)
10/03/17 @ 9:32 am called [phone] left vm that FMLA paperwork is at front desk reception waiting for Yaniah Thiemann to pick up.

## 2017-10-03 NOTE — Patient Instructions (Signed)
Dehydration, Adult Dehydration is when there is not enough fluid or water in your body. This happens when you lose more fluids than you take in. Dehydration can range from mild to very bad. It should be treated right away to keep it from getting very bad. Symptoms of mild dehydration may include:  Thirst.  Dry lips.  Slightly dry mouth.  Dry, warm skin.  Dizziness. Symptoms of moderate dehydration may include:  Very dry mouth.  Muscle cramps.  Dark pee (urine). Pee may be the color of tea.  Your body making less pee.  Your eyes making fewer tears.  Heartbeat that is uneven or faster than normal (palpitations).  Headache.  Light-headedness, especially when you stand up from sitting.  Fainting (syncope). Symptoms of very bad dehydration may include:  Changes in skin, such as: ? Cold and clammy skin. ? Blotchy (mottled) or pale skin. ? Skin that does not quickly return to normal after being lightly pinched and let go (poor skin turgor).  Changes in body fluids, such as: ? Feeling very thirsty. ? Your eyes making fewer tears. ? Not sweating when body temperature is high, such as in hot weather. ? Your body making very little pee.  Changes in vital signs, such as: ? Weak pulse. ? Pulse that is more than 100 beats a minute when you are sitting still. ? Fast breathing. ? Low blood pressure.  Other changes, such as: ? Sunken eyes. ? Cold hands and feet. ? Confusion. ? Lack of energy (lethargy). ? Trouble waking up from sleep. ? Short-term weight loss. ? Unconsciousness. Follow these instructions at home:  If told by your doctor, drink an ORS: ? Make an ORS by using instructions on the package. ? Start by drinking small amounts, about  cup (120 mL) every 5-10 minutes. ? Slowly drink more until you have had the amount that your doctor said to have.  Drink enough clear fluid to keep your pee clear or pale yellow. If you were told to drink an ORS, finish the ORS  first, then start slowly drinking clear fluids. Drink fluids such as: ? Water. Do not drink only water by itself. Doing that can make the salt (sodium) level in your body get too low (hyponatremia). ? Ice chips. ? Fruit juice that you have added water to (diluted). ? Low-calorie sports drinks.  Avoid: ? Alcohol. ? Drinks that have a lot of sugar. These include high-calorie sports drinks, fruit juice that does not have water added, and soda. ? Caffeine. ? Foods that are greasy or have a lot of fat or sugar.  Take over-the-counter and prescription medicines only as told by your doctor.  Do not take salt tablets. Doing that can make the salt level in your body get too high (hypernatremia).  Eat foods that have minerals (electrolytes). Examples include bananas, oranges, potatoes, tomatoes, and spinach.  Keep all follow-up visits as told by your doctor. This is important. Contact a doctor if:  You have belly (abdominal) pain that: ? Gets worse. ? Stays in one area (localizes).  You have a rash.  You have a stiff neck.  You get angry or annoyed more easily than normal (irritability).  You are more sleepy than normal.  You have a harder time waking up than normal.  You feel: ? Weak. ? Dizzy. ? Very thirsty.  You have peed (urinated) only a small amount of very dark pee during 6-8 hours. Get help right away if:  You have symptoms of   very bad dehydration.  You cannot drink fluids without throwing up (vomiting).  Your symptoms get worse with treatment.  You have a fever.  You have a very bad headache.  You are throwing up or having watery poop (diarrhea) and it: ? Gets worse. ? Does not go away.  You have blood or something green (bile) in your throw-up.  You have blood in your poop (stool). This may cause poop to look black and tarry.  You have not peed in 6-8 hours.  You pass out (faint).  Your heart rate when you are sitting still is more than 100 beats a  minute.  You have trouble breathing. This information is not intended to replace advice given to you by your health care provider. Make sure you discuss any questions you have with your health care provider. Document Released: 06/12/2009 Document Revised: 03/05/2016 Document Reviewed: 10/10/2015 Elsevier Interactive Patient Education  2018 [employer]  

## 2017-10-03 NOTE — Progress Notes (Signed)
Nutrition follow-up completed with patient during infusion for cholioangiocarcinoma with metastases to the lung. Weight documented at 112 pounds February 4 stable from January 21 however decreased from 132.2 pounds October 10 Patient continues to have variable appetite Reports she feels better if she consumes warm foods and liquids in the morning. Continues to work on strategies to decrease nausea. Reports Megace helps her eat. She is tiring of Carnation breakfast and would like suggestions for other sources of protein.  Nutrition diagnosis: Unintended weight loss continues.  Intervention: Patient educated to continue strategies for increased calories and protein throughout the day. Suggested patient could warm almond milk with Unjury protein powder to consume first thing in the morning Other questions answered and teach back method used  Monitoring evaluation goals: Patient will work to increase calories and protein for weight maintenance  Next visit: To be scheduled as needed.  **Disclaimer: This note was dictated with voice recognition software. Similar sounding words can inadvertently be transcribed and this note may contain transcription errors which may not have been corrected upon publication of note.**

## 2017-10-04 ENCOUNTER — Telehealth: Payer: Self-pay | Admitting: Hematology

## 2017-10-04 NOTE — Telephone Encounter (Signed)
Added appointments for 3/4 per 2/5 los. Left message for patient and patient to get updated schedule at next visit.

## 2017-10-06 ENCOUNTER — Ambulatory Visit (HOSPITAL_COMMUNITY): Admission: RE | Admit: 2017-10-06 | Payer: Federal, State, Local not specified - PPO | Source: Ambulatory Visit

## 2017-10-06 ENCOUNTER — Telehealth: Payer: Self-pay | Admitting: Emergency Medicine

## 2017-10-06 NOTE — Telephone Encounter (Signed)
PT forgot to do allergy prep for CT scan. Cira Rue asked for scan to be  canceled d/t this. Scan rescheduled for Monday 2/11 at 3:30 pm. Pt instructions given for oral contrast and allergy prep. Pt  Verbalized understanding of this.

## 2017-10-10 ENCOUNTER — Inpatient Hospital Stay (HOSPITAL_BASED_OUTPATIENT_CLINIC_OR_DEPARTMENT_OTHER): Payer: Federal, State, Local not specified - PPO | Admitting: Medical

## 2017-10-10 ENCOUNTER — Inpatient Hospital Stay: Payer: Federal, State, Local not specified - PPO

## 2017-10-10 ENCOUNTER — Other Ambulatory Visit: Payer: Self-pay | Admitting: Hematology

## 2017-10-10 ENCOUNTER — Telehealth: Payer: Self-pay | Admitting: *Deleted

## 2017-10-10 ENCOUNTER — Other Ambulatory Visit: Payer: Self-pay | Admitting: *Deleted

## 2017-10-10 ENCOUNTER — Ambulatory Visit (HOSPITAL_COMMUNITY): Admission: RE | Admit: 2017-10-10 | Payer: Federal, State, Local not specified - PPO | Source: Ambulatory Visit

## 2017-10-10 VITALS — BP 139/92 | HR 96 | Temp 98.3°F | Resp 20 | Ht 66.0 in

## 2017-10-10 DIAGNOSIS — C7800 Secondary malignant neoplasm of unspecified lung: Principal | ICD-10-CM

## 2017-10-10 DIAGNOSIS — C221 Intrahepatic bile duct carcinoma: Secondary | ICD-10-CM

## 2017-10-10 DIAGNOSIS — R112 Nausea with vomiting, unspecified: Secondary | ICD-10-CM

## 2017-10-10 DIAGNOSIS — C7801 Secondary malignant neoplasm of right lung: Secondary | ICD-10-CM

## 2017-10-10 LAB — CMP (CANCER CENTER ONLY)
ALT: 204 U/L — ABNORMAL HIGH (ref 0–55)
AST: 112 U/L — ABNORMAL HIGH (ref 5–34)
Albumin: 3.9 g/dL (ref 3.5–5.0)
Alkaline Phosphatase: 61 U/L (ref 40–150)
Anion gap: 14 — ABNORMAL HIGH (ref 3–11)
BUN: 18 mg/dL (ref 7–26)
CO2: 26 mmol/L (ref 22–29)
Calcium: 9.2 mg/dL (ref 8.4–10.4)
Chloride: 96 mmol/L — ABNORMAL LOW (ref 98–109)
Creatinine: 0.9 mg/dL (ref 0.60–1.10)
GFR, Est AFR Am: >60 mL/min (ref >60)
GFR, Estimated: >60 mL/min (ref >60)
Glucose, Bld: 85 mg/dL (ref 70–140)
Potassium: 3.5 mmol/L (ref 3.5–5.1)
Sodium: 136 mmol/L (ref 136–145)
Total Bilirubin: 4 mg/dL (ref 0.2–1.2)
Total Protein: 6.6 g/dL (ref 6.4–8.3)

## 2017-10-10 LAB — CBC WITH DIFFERENTIAL (CANCER CENTER ONLY)
Basophils Absolute: 0 10*3/uL (ref 0.0–0.1)
Basophils Relative: 1 %
Eosinophils Absolute: 0 10*3/uL (ref 0.0–0.5)
Eosinophils Relative: 1 %
HCT: 31.1 % — ABNORMAL LOW (ref 34.8–46.6)
Hemoglobin: 10.1 g/dL — ABNORMAL LOW (ref 11.6–15.9)
Lymphocytes Relative: 61 %
Lymphs Abs: 1.9 10*3/uL (ref 0.9–3.3)
MCH: 28.6 pg (ref 25.1–34.0)
MCHC: 32.6 g/dL (ref 31.5–36.0)
MCV: 87.6 fL (ref 79.5–101.0)
Monocytes Absolute: 0.3 10*3/uL (ref 0.1–0.9)
Monocytes Relative: 11 %
Neutro Abs: 0.8 10*3/uL — ABNORMAL LOW (ref 1.5–6.5)
Neutrophils Relative %: 26 %
Platelet Count: 240 10*3/uL (ref 145–400)
RBC: 3.55 MIL/uL — ABNORMAL LOW (ref 3.70–5.45)
RDW: 18.9 % — ABNORMAL HIGH (ref 11.2–14.5)
WBC Count: 3.1 10*3/uL — ABNORMAL LOW (ref 3.9–10.3)

## 2017-10-10 MED ORDER — SODIUM CHLORIDE 0.9% FLUSH
10.0000 mL | INTRAVENOUS | Status: DC | PRN
  Administered 2017-10-10: 10 mL
  Filled 2017-10-10: qty 10

## 2017-10-10 MED ORDER — HEPARIN SOD (PORK) LOCK FLUSH 100 UNIT/ML IV SOLN
500.0000 [IU] | Freq: Once | INTRAVENOUS | Status: AC | PRN
  Administered 2017-10-10: 500 [IU]
  Filled 2017-10-10: qty 5

## 2017-10-10 MED ORDER — SCOPOLAMINE 1 MG/3DAYS TD PT72
1.0000 | MEDICATED_PATCH | TRANSDERMAL | 2 refills | Status: AC
Start: 2017-10-10 — End: ?

## 2017-10-10 NOTE — Telephone Encounter (Signed)
Called Central Scheduling & received appt for CT r/s to Wednesday, 10/12/17 @ 1:15 pm for 1:30 appt & liquids only 4 hours prior. Message left for pt with this info & reminded to take her prednisone & benadryl as ordered & to call in am to verify that she received this info.

## 2017-10-10 NOTE — Telephone Encounter (Signed)
Pt left vm stating that she has been sick since Friday after eating [place] on Thursday.  She reports being weak after frequent vomiting.  She wants to r/s her CT scan from today.  Discussed with Symptom Management & pt will come @ 1 pm for IVF/lab/Van Tanner PA.  Message to [facility].

## 2017-10-10 NOTE — Progress Notes (Signed)
[facility] Progress Note   Nancy [name] [id number] Jan 05, 1966 52 y.o.  Nancy [name] is managed by Dr. 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  Her vomiting has resolved.  She request a refill of scopolamine patches.  This was refilled.  Cholangiocarcinoma metastatic to the right lung: Dr. Burr [name] is rescheduling the patient's CT scans which had previously been planned for today.  Hyperbilirubinemia: The patient's chemistries from today returned after she had left.  The patient's bilirubin was noted to be elevated at 4.0.  This was discussed with Dr. Burr [name] who is rescheduling the patient's CT scans.  Please see After Visit Summary for patient specific instructions.  Future Appointments  Date Time Provider [place]  10/12/2017  1:30 PM [facility] 2 [facility] [place]  10/17/2017  8:30 AM [facility] LAB 6 [facility] None  10/17/2017  8:45 AM [facility] INJ NURSE [facility] None  10/17/2017  9:15 AM Nancy Merle, MD [facility] None  10/17/2017 10:15 AM [facility] E17 [facility] None  10/31/2017  8:30 AM [facility] ONLY [facility] None  10/31/2017  8:45 AM [facility] INJ NURSE [facility] None  10/31/2017  9:30 AM Alla Feeling, NP [facility] None  10/31/2017 10:30 AM [facility] B5 [facility] None  11/02/2017 12:15 PM [facility] FLUSH NURSE 2 [facility] None    Orders Placed This Encounter  Procedures  . SCHEDULING  COMMUNICATION       Subjective:   Patient ID:  Nancy [name] is a 52 y.o. (DOB Oct 20, 1965) female.  Chief Complaint:  Chief Complaint  Patient presents with  . Nausea    HPI Nancy [name] is a 52 year old female with a metastatic cholangiocarcinoma to the lung.  She is managed by Dr. Truitt [name] and was last seen on 10/03/2017.  She is currently treated with third line CAPOX (Xeloda 1500 mg p.o. twice daily for 1 week on and one week off with low-dose oxalic platinum every 2 weeks).  She has been on this treatment since 08/16/2017.  She stopped Xeloda on 09/19/2017 and began a 5-FU pump.  She was last treated on 10/03/2017.  She contacted our office this morning stating that she had been feeling sick since Friday after eating takeout from a [employer] on Thursday.  She reports weakness and vomiting since Friday. This has been getting better today. She is having normal bowel movements.  She is previously scheduled to have a CT scan done today but wishes to have that rescheduled.  She reports that she ate chicken broth today.  Her appetite is better.  Her nausea has almost completely resolved.  She has had no vomiting today.  She did take nausea medicines this morning.  She request a refill of her scopolamine patches.  Medications: I have reviewed the patient's current medications.  Allergies:  Allergies  Allergen Reactions  . Penicillins Anaphylaxis    Has patient had a PCN reaction causing immediate rash, facial/tongue/throat swelling, SOB or lightheadedness with hypotensionYes Swelling in throat  Has patient had a PCN reaction causing severe  rash involving mucus membranes or skin necrosis:/No Has patient had a PCN reaction that required hospitalization/No Has patient had a PCN reaction occurring within the last 10 years:NO If all of the above answers are "NO", then may proceed with Cephalosporin use.   . Contrast Media [Iodinated Diagnostic Agents] Other (See Comments)     Tingling under left rib area   . Lipitor [Atorvastatin] Palpitations    Tingling, flushing    Past Medical History:  Diagnosis Date  . Allergy    SEASONAL  . Anemia    HIGH S[employer]OL  . Atopic eczema   . cholangio ca dx'd 07/12/16  . Fibrocystic breast disease   . GERD (gastroesophageal reflux disease)   . Hx of migraines    seasonal   . Personal history of chemotherapy   . Pneumonia    6 years ago   . TIA (transient ischemic attack)     Past Surgical History:  Procedure Laterality Date  . EUS N/A 07/07/2016   Procedure: UPPER ENDOSCOPIC ULTRASOUND (EUS) RADIAL;  Surgeon: [name], MD;  Location: [facility];  Service: Endoscopy;  Laterality: N/A;  . IR GENERIC HISTORICAL  08/03/2016   IR US GUIDE VASC ACCESS RIGHT 08/03/2016 Corrie Mckusick, DO [facility]  . IR GENERIC HISTORICAL  08/03/2016   IR FLUORO GUIDE PORT INSERTION RIGHT 08/03/2016 Corrie Mckusick, DO [facility]  . MYOMECTOMY  2002    Family History  Problem Relation Age of Onset  . Gout Mother   . Diabetes Mother   . Hypertension Father   . Diabetes Father   . Breast cancer Paternal Aunt   . Cancer Maternal Grandfather        throat cancer   . Cancer Cousin 69       GI cancer   . Thyroid disease Neg Hx     Social History   Socioeconomic History  . Marital status: Married    Spouse name: Not on file  . Number of children: 0  . Years of education: 35  . Highest education level: Not on file  Social Needs  . Financial resource strain: Not on file  . Food insecurity - worry: Not on file  . Food insecurity - inability: Not on file  . Transportation needs - medical: Not on file  . Transportation needs - non-medical: Not on file  Occupational History  . Occupation: HR fml[employer]    Employer: [employer]    Employer: [employer]  Tobacco Use  . Smoking status: Never Smoker  . Smokeless tobacco: Never Used  Substance and Sexual Activity  . Alcohol use: Yes    Comment: maybe 3 times a year  . Drug use: No   . Sexual activity: Yes    Partners: Male  Other Topics Concern  . Not on file  Social History Narrative  . Not on file    Past Medical History, Surgical history, Social history, and Family history were reviewed and updated as appropriate.   Please see review of systems for further details on the patient's review from today.   Review of Systems:  Review of Systems  Constitutional: Negative for chills, diaphoresis and fever.  HENT: Negative for trouble swallowing.   Respiratory: Negative for cough, choking, shortness of breath and wheezing.   Cardiovascular: Negative for chest pain and palpitations.  Gastrointestinal: Positive for nausea. Negative for abdominal pain, constipation, diarrhea and vomiting.  Genitourinary: Negative for difficulty urinating.  Neurological: Negative for headaches.    Objective:  Physical Exam:  BP (!) 139/92 (BP Location: Left Arm, Patient Position: Sitting)   Pulse 96   Temp 98.3 F (36.8 C) (Oral)   Resp 20   Ht 5\' 6"  (1.676 m)   LMP 05/20/2016   SpO2 100%   BMI 18.08 kg/m  ECOG: 1  Physical Exam  Constitutional: No distress.  HENT:  Head: Normocephalic and atraumatic.  Eyes:  Mild scleral icterus  Cardiovascular: Normal rate, regular rhythm and normal heart sounds. Exam reveals no gallop and no friction rub.  No murmur heard. Pulmonary/Chest: Effort normal and breath sounds normal. No respiratory distress. She has no wheezes. She has no rales.  Abdominal: Soft. Bowel sounds are normal. She exhibits no distension and no mass. There is no tenderness. There is no rebound and no guarding.  Musculoskeletal: She exhibits no edema.  Neurological: She is alert. Coordination normal.  Skin: Skin is warm and dry. No rash noted. She is not diaphoretic. No erythema.  Psychiatric: She has a normal mood and affect. Her behavior is normal. Judgment and thought content normal.    Lab Review:     Component Value Date/Time   NA 136 10/10/2017 1315    NA 137 08/31/2017 0819   K 3.5 10/10/2017 1315   K 3.4 (L) 08/31/2017 0819   CL 96 (L) 10/10/2017 1315   CO2 26 10/10/2017 1315   CO2 21 (L) 08/31/2017 0819   GLUCOSE 85 10/10/2017 1315   GLUCOSE 103 08/31/2017 0819   BUN 18 10/10/2017 1315   BUN 11.4 08/31/2017 0819   CREATININE 0.90 10/10/2017 1315   CREATININE 0.8 08/31/2017 0819   CALCIUM 9.2 10/10/2017 1315   CALCIUM 9.3 08/31/2017 0819   PROT 6.6 10/10/2017 1315   PROT 7.1 08/31/2017 0819   ALBUMIN 3.9 10/10/2017 1315   ALBUMIN 4.0 08/31/2017 0819   AST 112 (H) 10/10/2017 1315   AST 20 08/31/2017 0819   ALT 204 (H) 10/10/2017 1315   ALT 13 08/31/2017 0819   ALKPHOS 61 10/10/2017 1315   ALKPHOS 32 (L) 08/31/2017 0819   BILITOT 4.0 (HH) 10/10/2017 1315   BILITOT 0.77 08/31/2017 0819   GFRNONAA >60 10/10/2017 1315   GFRAA >60 10/10/2017 1315       Component Value Date/Time   WBC 3.1 (L) 10/10/2017 1315   WBC 2.5 (L) 10/03/2017 1120   RBC 3.55 (L) 10/10/2017 1315   HGB 9.8 (L) 10/03/2017 1120   HGB 10.0 (L) 08/31/2017 0820   HCT 31.1 (L) 10/10/2017 1315   HCT 30.0 (L) 08/31/2017 0820   PLT 240 10/10/2017 1315   PLT 198 08/31/2017 0820   MCV 87.6 10/10/2017 1315   MCV 86.4 08/31/2017 0820   MCH 28.6 10/10/2017 1315   MCHC 32.6 10/10/2017 1315   RDW 18.9 (H) 10/10/2017 1315   RDW 15.6 (H) 08/31/2017 0820   LYMPHSABS 1.9 10/10/2017 1315   LYMPHSABS 1.0 08/31/2017 0820   MONOABS 0.3 10/10/2017 1315   MONOABS 0.2 08/31/2017 0820   EOSABS 0.0 10/10/2017 1315   EOSABS 0.0 08/31/2017 0820   BASOSABS 0.0 10/10/2017 1315   BASOSABS 0.0 08/31/2017 0820   -------------------------------  Imaging from last 24 hours (if applicable):  Radiology interpretation: No results found.

## 2017-10-12 ENCOUNTER — Ambulatory Visit (HOSPITAL_COMMUNITY): Payer: Federal, State, Local not specified - PPO

## 2017-10-13 ENCOUNTER — Other Ambulatory Visit: Payer: Self-pay | Admitting: *Deleted

## 2017-10-13 DIAGNOSIS — R110 Nausea: Secondary | ICD-10-CM

## 2017-10-13 DIAGNOSIS — R630 Anorexia: Secondary | ICD-10-CM

## 2017-10-13 MED ORDER — OLANZAPINE 2.5 MG PO TABS: 2.5000 mg | ORAL_TABLET | Freq: Every day | ORAL | 1 refills | Status: DC

## 2017-10-14 ENCOUNTER — Encounter (HOSPITAL_COMMUNITY): Payer: Self-pay

## 2017-10-14 ENCOUNTER — Ambulatory Visit (HOSPITAL_COMMUNITY): Payer: Federal, State, Local not specified - PPO

## 2017-10-14 ENCOUNTER — Ambulatory Visit (HOSPITAL_COMMUNITY)
Admission: RE | Admit: 2017-10-14 | Discharge: 2017-10-14 | Disposition: A | Discharge status: Home / Self Care | Payer: Federal, State, Local not specified - PPO | Source: Ambulatory Visit | Attending: Hematology | Admitting: Hematology

## 2017-10-14 DIAGNOSIS — C221 Intrahepatic bile duct carcinoma: Secondary | ICD-10-CM

## 2017-10-14 DIAGNOSIS — C7800 Secondary malignant neoplasm of unspecified lung: Principal | ICD-10-CM

## 2017-10-14 NOTE — H&P (View-Only) (Signed)
[place]  [phone] [phone]  Clinic Follow Up Note   Patient Care Team: Carollee Herter, Alferd Apa, DO as PCP - General Pyrtle, Lajuan Lines, MD as Consulting Physician (Gastroenterology)   Date of Service:  10/17/2017  CHIEF COMPLAINTS:  Follow up metastatic intrahepatic cholangiocarcinoma  Oncology History   Metastatic cholangiocarcinoma to lung [facility])   Staging form: Perihilar Bile Ducts, AJCC 7th Edition   - Clinical stage from 07/07/2016: Stage IVB (T2b, N1, M1) - Signed by Truitt Merle, MD on 07/28/2016      Metastatic cholangiocarcinoma to lung ([place])   05/22/2014 Procedure    Routine screening colonoscopy showed a sessile polyp measuring 4 mm in the sigmoid colon, removed, otherwise negative.      06/29/2016 Imaging    CT chest, abdomen and pelvis showed 2 indeterminant nodule in left and the right lung, 4-54m, indeterminate a hypovascular liver lesions largest in the caudate lobe measuring 2.6 cm, mild abdominal lymphadenopathy in the gastrohepatic ligament and portocaval space.      07/02/2016 Imaging    Abdominal MRI with and without contrast showed 2 lobular lesions in the caudate lobe, and is regular lymph nodes in the gastrohepatic ligament which is adjacent to the liver lesion, suspicious for malignancy. 2 small lesions in the right hepatic lobe are indeterminate, but concerning for metastasis.      07/07/2016 Procedure    EGD was negative, EUS biopsy of the liver and adjacent lymph node      07/07/2016 Initial Diagnosis    [place]ast[place]      07/07/2016 Initial Biopsy    Fine-needle aspiration of the liver lesion in caudate lobe and adjacent lymph nodes both showed metastatic adenocarcinoma.      08/11/2016 - 04/25/2017 Chemotherapy    Cisplatin 25 mg/m, gemcitabine 1000 mg/m, on day one and 8, every 21 days   Cisplatin and gemcitabine on Day 1, 8 every 21 days, with neulasta on day 9, Gemcitabine  dosed reduced to 800 mg/m due to cytopenias, increased to '900mg'$ /m2 on 12/06/2016. Changes to gemcitabine maintenance therapy on day 1 and 8 every 21 days, from 04/18/2017, gemcitabine was reduced dose to 400 mg/m on 04/25/17       10/18/2016 Imaging    CT CAP  IMPRESSION: 1. Again noted are multifocal liver metastasis. Overall there has been no significant interval change in overall volume of liver metastases. 2. Similar appearance of upper abdominal adenopathy 3. Slight decrease in size of small pulmonary metastases.      01/25/2017 Imaging    CT CAP W Contrast 01/25/17 IMPRESSION: 1. Response to therapy. 2. Similar to minimal decrease in pulmonary nodules/metastasis. 3. Improvement in hepatic disease burden. 4. Decrease in abdominal adenopathy. 5. Significantly age advanced atherosclerosis, including within the coronary arteries. 6. Uterine fibroids. 7. Suspect a complex cyst in the left ovary. Recommend attention on follow-up.       04/13/2017 Imaging    CT CAP W Contrast IMPRESSION: 1. The dominant caudate lobe mass is stable in size and appearance, as is the indistinct adenopathy in the porta hepatis adjacent to the caudate lobe, and in the retroperitoneum. Several subtle additional liver lesions are slightly less conspicuous on today' s exam. The pulmonary nodules are stable, and mild omental nodularity in the left lower quadrant is very slightly worsened compared to prior. Overall the burden of malignancy appears similar to prior, and some of the findings may represent effectively treated metastatic lesions. 2. Complex left  adnexal mass, 6.7 by 4.1 cm, enlarging. Pelvic sonography recommended for further characterization. 3. Other imaging findings of potential clinical significance: Aortic Atherosclerosis (ICD10-I70.0). Coronary atherosclerosis. Prominent stool throughout the colon favors constipation. Fibroid uterus.      05/16/2017 - 06/27/2017 Chemotherapy     Second-line chemo FOLFOX every 2 weeks with neulasta after pump DC. She will started with low dose FOLFOX on 05/16/17, due to her neutropenia. Held oxaliplatin with cycle two due to poor toleration. Discontinued after cycle 3 (06/27/17) due to disease progression.       07/20/2017 Imaging    IMPRESSION: CT CHEST  1. Interval development of 2 small new pulmonary nodules concerning for progressive, or new metastatic disease. Recommend continued attention on follow-up imaging. 2. The previously noted right upper and left upper lobe pulmonary nodules remain stable in size consistent with treated metastatic disease. 3. Stable position of right IJ approach power injectable port catheter. 4. Coronary artery calcifications. CT ABD/PELVIS  1. Marked interval enlargement of a complex, enhancing, cystic and solid left ovarian mass which now fills the posterior aspect of the pelvis exerting mass effect on the adjacent rectosigmoid colon and bladder. This likely accounts for the patient's lower abdominal pain and sensation of constipation (there is no significant stool burden within the rectum or sigmoid colon). These findings are highly suspicious for a primary ovarian cystic or mucinous adenocarcinoma. 2. Enlarging omental implants in the left lower quadrant highly concerning for metastatic ovarian carcinoma. 3. New/enlarging subcapsular hepatic lesion may represent progressive cholangio or ovarian carcinoma metastases. 4. Enlarging caudate lobe lesion and aortocaval/portacaval confluent necrotic adenopathy concerning for progressive metastatic cholangiocarcinoma. 5. The segment 6 hepatic lesion remains stable or may be smaller on today's examination. These results will be called to the ordering clinician or representative by the Radiologist Assistant, and communication documented in the PACS or zVision Dashboard.        07/30/2017 Imaging     CT AP W Contrast  07/30/17 IMPRESSION: 1. No substantial interval change in exam. 2. Complex cystic and solid mass filling the cul-de-sac and left adnexal space is similar to prior. There is abnormal soft tissue in the anterior left lower quadrant concerning for metastatic disease. 3. Stable appearance of the caudate lesion in this patient with known cholangiocarcinoma. There is confluent necrotic lymphadenopathy in the porta hepatis, similar to prior with substantial mass-effect on the left renal vein as it enters the IVC. 4. Fibroid change in the uterus. 5. Trace intraperitoneal free fluid.      08/05/2017 Pathology Results    Diagnosis 08/05/17  Peritoneum, biopsy, left lower abdomen METASTATIC ADENOCARCINOMA, CONSISTENT WITH CHOLANGIOCARCINOMA. Microscopic Comment      08/08/2017 Imaging    CT AP WO Contrast 08/08/17 IMPRESSION: 1. No substantial interval change in exam although assessment today limited by lack of intravenous contrast material. 2. Stomach is distended with fluid, similar to prior. 3. Patient's known caudate liver lesion not well demonstrated on today's exam. 4. Similar appearance large complex cystic and solid mass in the cul-de-sac and left adnexal space. 5. Similar appearance abnormal soft tissue in the anterior left lower quadrant. 6. Fibroid change in the uterus.      08/16/2017 -  Chemotherapy    CAPOX: Xeloda '1500mg'$  BID for 1 week on and 1 week off with low dose oxaliplatin starting 08/16/17. Stopped Xeloda 09/19/17 and start 5-FU pump.          HISTORY OF PRESENTING ILLNESS: 07/28/16 Nancy [name] 52 y.o. female is here because  of Her recently diagnosed metastatic glandular carcinoma. She is accompanied by her husband and mother to my clinic today.  She has been having RUQ abdominal pain since mid 04/2016, she was seen by PCP and was treated for gas with GI cocktail and pepcide, which she did not help much. Her pain got worse, and radiates to right shoulder,  she denies significant nausea, or bloating, BM normal, no fever, cough or dyspnea. She recently noticed mild chest discomfort in the low sternum area. She initially had the lab, ultrasound done by her primary care physician, which was unrevealing. She was referred to GI Dr. Hilarie Fredrickson and underwent EGD and CT scan, which showed a lobulated mass in the caudate lobe of liver, with at adjacent adenopathy. He underwent EUS and fine-needle biopsy of the liver mass and lymph nodes, all reviewed adenocarcinoma.  She has lost about 7lbs in the past 3 months. She has mild fatigue, but able to function at home. She has stopped working due to her abdominal pain and fatigue. She takes Norco every 1-3 times a day, and her pain seems not well controlled.  CURRENT THERAPY: Third line CAPOX: Xeloda '1500mg'$  BID for 1 week on and 1 week off with low dose oxaliplatin every 2 weeks starting 08/16/17. Stopped Xeloda 09/19/17 and start 5-FU pump.    INTERIM HISTORY:  Imajean Mcdermid returns for follow up and is accompanied by her aunt. She saw Lucianne Lei last week for persistent emesis and states that is has continued until 2 days ago. She reports that she hadn't been able to eat well and take her vitamins. She has had improved nausea and a fever of 99.6 last night. She also states she is feeling stressed, overwhelmed, and frustrated. She has been seen by Psychologist, educational. She takes xanax about once a week. She is scheduled to have a CT AP W Contrast today.   On review of systems, pt denies memory loss, cough, SOB or any other complaints at this time. Pertinent positives are listed and detailed within the above HPI.   REVIEW OF SYSTEMS:  Constitutional: Denies fevers, chills or abnormal night sweats(+) decreased appetite  (+) weight loss  Eyes: Denies blurriness of vision, double vision or watery eyes Ears, nose, mouth, throat, and face: Denies mucositis or sore throat Respiratory: Denies dyspnea or wheezes Cardiovascular:  Denies palpitation, chest discomfort or lower extremity swelling Gastrointestinal:  Denies heartburn or change in bowel habits  (+) increase in controlled nausea and abdominal pain, pain now 2/10 with IV dilaudid.  Skin: Denies abnormal skin rashes  Lymphatics: Denies new lymphadenopathy or easy bruising Neurological:Denies numbness, tingling or new weaknesses (+) "chemo brain" (+)  distibular migraine Behavioral/Psych:  (+) anxiety  Musculoskeletal: (+) back pain  All other systems were reviewed with the patient and are negative.   MEDICAL HISTORY:  Past Medical History:  Diagnosis Date  . Allergy    SEASONAL  . Anemia    [employer]  . Atopic eczema   . cholangio ca dx'd 07/12/16  . Fibrocystic breast disease   . GERD (gastroesophageal reflux disease)   . Hx of migraines    seasonal   . Personal history of chemotherapy   . Pneumonia    6 years ago   . TIA (transient ischemic attack)     SURGICAL HISTORY: Past Surgical History:  Procedure Laterality Date  . EUS N/A 07/07/2016   Procedure: UPPER ENDOSCOPIC ULTRASOUND (EUS) RADIAL;  Surgeon: [name], MD;  Location: [facility];  Service: Endoscopy;  Laterality: N/A;  . IR GENERIC HISTORICAL  08/03/2016   IR US GUIDE VASC ACCESS RIGHT 08/03/2016 Corrie Mckusick, DO [facility]  . IR GENERIC HISTORICAL  08/03/2016   IR FLUORO GUIDE PORT INSERTION RIGHT 08/03/2016 Corrie Mckusick, DO [facility]  . MYOMECTOMY  2002    SOCIAL HISTORY: Social History   Socioeconomic History  . Marital status: Married    Spouse name: Not on file  . Number of children: 0  . Years of education: 51  . Highest education level: Not on file  Social Needs  . Financial resource strain: Not on file  . Food insecurity - worry: Not on file  . Food insecurity - inability: Not on file  . Transportation needs - medical: Not on file  . Transportation needs - non-medical: Not on file  Occupational History  . Occupation: HR fml[employer]    Employer: [employer]    Employer: [employer]  Tobacco Use  . Smoking status: Never Smoker  . Smokeless tobacco: Never Used  Substance and Sexual Activity  . Alcohol use: Yes    Comment: maybe 3 times a year  . Drug use: No  . Sexual activity: Yes    Partners: Male  Other Topics Concern  . Not on file  Social History Narrative  . Not on file    FAMILY HISTORY: Family History  Problem Relation Age of Onset  . Gout Mother   . Diabetes Mother   . Hypertension Father   . Diabetes Father   . Breast cancer Paternal Aunt   . Cancer Maternal Grandfather        throat cancer   . Cancer Cousin 70       GI cancer   . Thyroid disease Neg Hx     ALLERGIES:  is allergic to penicillins; contrast media [iodinated diagnostic agents]; and lipitor [atorvastatin].  MEDICATIONS:  Current Outpatient Medications  Medication Sig Dispense Refill  . ALPRAZolam (XANAX) 0.5 MG tablet Take 1 tablet (0.5 mg total) by mouth 3 (three) times daily as needed for anxiety. 30 tablet 0  . aspirin 81 MG tablet Take 81 mg by mouth daily.    . cetirizine (ZYRTEC) 10 MG tablet Take 10 mg by mouth daily as needed for allergies. For allergies     . Cholecalciferol (VITAMIN D3) 5000 UNITS CAPS Take 5,000 Units by mouth daily.     . COLOSTRUM PO Take 1.25 g by mouth 2 (two) times daily.     Marland Kitchen dicyclomine (BENTYL) 20 MG tablet Take 20 mg by mouth 3 (three) times daily as needed for spasms.     Marland Kitchen lidocaine-prilocaine (EMLA) cream Apply 1 application topically as needed. Apply to portacath 1 1/2 hours - 2 hours prior to procedures as needed. 30 g 1  . LORazepam (ATIVAN) 0.5 MG tablet Place under tongue and dissolve every 8 hours as needed for nausea. Do not take Xanax while taking this medication. 30 tablet 0  . megestrol (MEGACE) 400 MG/10ML suspension Take 10 mLs (400 mg total) by mouth daily. 240 mL 0  . Menaquinone-7 (VITAMIN K2) 40 MCG TABS Take 40 mg by mouth daily.     . mometasone (ELOCON) 0.1 % lotion Apply topically  daily. 60 mL 0  . omeprazole (PRILOSEC) 40 MG capsule Take 1 capsule (40 mg total) by mouth 2 (two) times daily before a meal. 60 capsule 1  . ondansetron (ZOFRAN) 8 MG tablet Take 1 tablet (8 mg total) by  mouth every 8 (eight) hours as needed for nausea or vomiting. 30 tablet 3  . oxyCODONE (OXYCONTIN) 60 MG 12 hr tablet Take 60 mg by mouth every 12 (twelve) hours. 60 each 0  . Oxycodone HCl 10 MG TABS Take 1 tablet (10 mg total) by mouth every 3 (three) hours as needed. 100 tablet 0  . polyethylene glycol (MIRALAX / GLYCOLAX) packet Take 17 g by mouth 2 (two) times daily. Hold if you develop diarrhea 100 each 0  . predniSONE (DELTASONE) 50 MG tablet Take 1 tablet 13 hours, 7 hours and 1 hour prior to study 3 tablet 0  . promethazine (PHENERGAN) 25 MG tablet Take 1 tablet (25 mg total) by mouth every 6 (six) hours as needed for nausea or vomiting. 40 tablet 0  . scopolamine (TRANSDERM-SCOP) 1 MG/3DAYS Place 1 patch (1.5 mg total) onto the skin every 3 (three) days. 10 patch 2  . senna (SENOKOT) 8.6 MG TABS tablet Take 2 tablets (17.2 mg total) by mouth 2 (two) times daily. 120 each 0  . HYDROmorphone (DILAUDID) 2 MG tablet Take 1-2 tablets (2-4 mg total) by mouth every 4 (four) hours as needed for severe pain. (Patient not taking: Reported on 10/17/2017) 60 tablet 0   No current facility-administered medications for this visit.    Facility-Administered Medications Ordered in Other Visits  Medication Dose Route Frequency Provider Last Rate Last Dose  . alteplase (CATHFLO ACTIVASE) injection 2 mg  2 mg Intracatheter Once PRN Truitt Merle, MD      . heparin lock flush 100 unit/mL  250 Units Intracatheter Once PRN Truitt Merle, MD      . iopamidol (ISOVUE-300) 61 % injection           . ondansetron (ZOFRAN) injection 8 mg  8 mg Intravenous Once Truitt Merle, MD      . sodium chloride flush (NS) 0.9 % injection 10 mL  10 mL Intracatheter PRN Truitt Merle, MD   10 mL at 09/09/16 1645  . sodium chloride flush (NS)  0.9 % injection 10 mL  10 mL Intravenous PRN Truitt Merle, MD   10 mL at 02/11/17 1515   PHYSICAL EXAMINATION:  ECOG PERFORMANCE STATUS: 2 Vitals:   10/17/17 1008  BP: (!) 143/97  Pulse: 99  Resp: 16  Temp: 98.7 F (37.1 C)  TempSrc: Oral  SpO2: 100%  Weight: 108 lb 12.8 oz (49.4 kg)  Height: '5\' 6"'$  (1.676 m)    GENERAL:alert, no distress and comfortable SKIN: skin color, texture, turgor are normal, no rashes or significant lesions, several hematomas on extremities  EYES: normal, conjunctiva are pink and non-injected, sclera clear OROPHARYNX:no exudate, no erythema and lips, buccal mucosa (+) oral thrush  NECK: supple, thyroid normal size, non-tender, without nodularity LYMPH:  no palpable lymphadenopathy in the cervical, axillary or inguinal LUNGS: clear to auscultation and percussion with normal breathing effort HEART: regular rate & rhythm and no murmurs and no lower extremity edema ABDOMEN: abdomen soft, non-tender and active bowel sounds, no hepatomegaly (+) mild tenderness on the right mid-abdomen with a large abdominal mass in that area. No organomegaly. Musculoskeletal:no cyanosis of digits and no clubbing  PSYCH: alert & oriented x 3 with fluent speech NEURO: no focal motor/sensory deficits  LABORATORY DATA:  I have reviewed the data as listed CBC Latest Ref Rng & Units 10/17/2017 10/10/2017 10/03/2017  WBC 3.9 - 10.3 K/uL 2.8(L) 3.1(L) 2.5(L)  Hemoglobin 11.6 - 15.9 g/dL - - 9.8(L)  Hematocrit 34.8 - 46.6 %  28.8(L) 31.1(L) 29.3(L)  Platelets 145 - 400 K/uL 211 240 138(L)   CMP Latest Ref Rng & Units 10/17/2017 10/10/2017 10/03/2017  Glucose 70 - 140 mg/dL 162(H) 85 88  BUN 7 - 26 mg/dL '12 18 11  '$ Creatinine 0.60 - 1.10 mg/dL 1.59(H) 0.90 1.44(H)  Sodium 136 - 145 mmol/L 134(L) 136 135(L)  Potassium 3.5 - 5.1 mmol/L 3.7 3.5 3.1(L)  Chloride 98 - 109 mmol/L 93(L) 96(L) 96(L)  CO2 22 - 29 mmol/L '28 26 27  '$ Calcium 8.4 - 10.4 mg/dL 9.9 9.2 9.2  Total Protein 6.4 - 8.3 g/dL 7.1  6.6 6.7  Total Bilirubin 0.2 - 1.2 mg/dL 6.1(HH) 4.0(HH) 2.0(H)  Alkaline Phos 40 - 150 U/L 124 61 53  AST 5 - 34 U/L 109(H) 112(H) 68(H)  ALT 0 - 55 U/L 178(H) 204(H) 77(H)    CA19.9 (0-35U/ml) 07/28/2016: 36 08/18/2016: 51 09/13/2016: 56 10/19/2016: 43 11/09/2016: 44 12/06/2016: 41 01/05/2017: 35 02/02/2017: 37 03/07/17: 43 04/18/17: 66 05/16/17: 111 06/08/17: 119 06/18/17: 107 07/18/17: 161 08/16/17: 272 09/19/17: 669  CA 125 07/25/17: 100.6    PATHOLOGY    Diagnosis 08/05/17  Peritoneum, biopsy, left lower abdomen METASTATIC ADENOCARCINOMA, CONSISTENT WITH CHOLANGIOCARCINOMA. Microscopic Comment The adenocarcinoma stains positive for ck7, negative for ER, GATA 3, ck20, cdx2, and TTF-1. Pax 8 (patchy stain). The morphology and immunostain pattern support the above diagnosis. The block B is sent to [facility] for further molecular testing.   Diagnosis 07/07/2016 FINE NEEDLE ASPIRATION, ENDOSCOPIC, LIVER (SPECIMEN 2 OF 2 COLLECTED 07/07/16): MALIGNANT CELLS CONSISTENT WITH ADENOCARCINOMA.  ADDITIONAL INFORMATION: Per request, immunohistochemistry was performed. The cells are positive for cytokeratin 7 and negative for cytokeratin 20 and CDX-2. This excludes a colorectal primary. Pancreaticobiliary and upper GI sources remain in the differential. Called to Dr. Burr Medico on 07/28/2016.  Diagnosis 07/07/2016 FINE NEEDLE ASPIRATION, ENDOSCOPIC, GASTRO-HEPATIC LYMPH NODE (SPECIMEN 1 OF 2 COLLECTED 07/07/16): MALIGNANT CELLS CONSISTENT WITH ADENOCARCINOMA, SEE COMMENT. Preliminary Diagnosis Intraoperative Diagnosis: Few atypical glandular clusters - recommend additional material. 4) Adequate ([facility])   PROCEDURES  Upper EUS 07/07/2016 Dr. Ardis Hughs  - Endoscopically normal UGI tract (good views with standard gastroscope) - Vaguely bordered 4cm mass in the caudate lobe of liver that is confluent with what appear to be enlarged gastrohepatic lymphnodes (these may represent direct tumor  extension however). Preliminary cytology from the lymphnodes shows malignancy, glandular. Preliminary cytology from the liver mass shows the same. Await final cytology results but this is most suspicious for peripheral intrahepatic cholangiocarcinoma with adjacent malignant adenopathy.  COLONOSCOPY 05/22/2014 ENDOSCOPIC IMPRESSION: 1. Sessile polyp measuring 4 mm in size was found in the sigmoid colon; polypectomy was performed with a cold snare 2. The colon mucosa was otherwise normal    RADIOGRAPHIC STUDIES: I have personally reviewed the radiological images as listed and agreed with the findings in the report.  CT AP WO Contrast 08/08/17 IMPRESSION: 1. No substantial interval change in exam although assessment today limited by lack of intravenous contrast material. 2. Stomach is distended with fluid, similar to prior. 3. Patient's known caudate liver lesion not well demonstrated on today's exam. 4. Similar appearance large complex cystic and solid mass in the cul-de-sac and left adnexal space. 5. Similar appearance abnormal soft tissue in the anterior left lower quadrant. 6. Fibroid change in the uterus.  CT AP W Contrast 07/30/17 IMPRESSION: 1. No substantial interval change in exam. 2. Complex cystic and solid mass filling the cul-de-sac and left adnexal space is similar to prior. There is abnormal  soft tissue in the anterior left lower quadrant concerning for metastatic disease. 3. Stable appearance of the caudate lesion in this patient with known cholangiocarcinoma. There is confluent necrotic lymphadenopathy in the porta hepatis, similar to prior with substantial mass-effect on the left renal vein as it enters the IVC. 4. Fibroid change in the uterus. 5. Trace intraperitoneal free fluid.  CT CAP W Contrast 07/21/17 IMPRESSION: CT CHEST 1. Interval development of 2 small new pulmonary nodules concerning for progressive, or new metastatic disease. Recommend continued  attention on follow-up imaging. 2. The previously noted right upper and left upper lobe pulmonary nodules remain stable in size consistent with treated metastatic disease. 3. Stable position of right IJ approach power injectable port catheter. 4. Coronary artery calcifications. CT ABD/PELVIS 1. Marked interval enlargement of a complex, enhancing, cystic and solid left ovarian mass which now fills the posterior aspect of the pelvis exerting mass effect on the adjacent rectosigmoid colon and bladder. This likely accounts for the patient's lower abdominal pain and sensation of constipation (there is no significant stool burden within the rectum or sigmoid colon). These findings are highly suspicious for a primary ovarian cystic or mucinous adenocarcinoma. 2. Enlarging omental implants in the left lower quadrant highly concerning for metastatic ovarian carcinoma. 3. New/enlarging subcapsular hepatic lesion may represent progressive cholangio or ovarian carcinoma metastases. 4. Enlarging caudate lobe lesion and aortocaval/portacaval confluent necrotic adenopathy concerning for progressive metastatic cholangiocarcinoma. 5. The segment 6 hepatic lesion remains stable or may be smaller on today's examination. These results will be called to the ordering clinician or representative by the Radiologist Assistant, and communication documented in the PACS or zVision Dashboard.   CT CAP W Contrast, 04/13/17 IMPRESSION: 1. The dominant caudate lobe mass is stable in size and appearance, as is the indistinct adenopathy in the porta hepatis adjacent to the caudate lobe, and in the retroperitoneum. Several subtle additional liver lesions are slightly less conspicuous on today' s exam. The pulmonary nodules are stable, and mild omental nodularity in the left lower quadrant is very slightly worsened compared to prior. Overall the burden of malignancy appears similar to prior, and some of the findings may represent  effectively treated metastatic lesions. 2. Complex left adnexal mass, 6.7 by 4.1 cm, enlarging. Pelvic sonography recommended for further characterization. 3. Other imaging findings of potential clinical significance: Aortic Atherosclerosis (ICD10-I70.0). Coronary atherosclerosis. Prominent stool throughout the colon favors constipation. Fibroid uterus.  CT CAP W Contrast 01/25/17 IMPRESSION: 1. Response to therapy. 2. Similar to minimal decrease in pulmonary nodules/metastasis. 3. Improvement in hepatic disease burden. 4. Decrease in abdominal adenopathy. 5. Significantly age advanced atherosclerosis, including within the coronary arteries. 6. Uterine fibroids. 7. Suspect a complex cyst in the left ovary. Recommend attention on follow-up.  CT CAP W CONTRAST 10/18/16 IMPRESSION: 1. Again noted are multifocal liver metastasis. Overall there has been no significant interval change in overall volume of liver metastases. 2. Similar appearance of upper abdominal adenopathy 3. Slight decrease in size of small pulmonary metastases.   ASSESSMENT & PLAN:  52 y.o. African-American female, without significant past medical history, presented with abdominal pain  1. 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 -We have previously reviewed her case in our GI tumor board. Based on the scans and endoscopy findings, this is most consistent with cholangiocarcinoma. Her liver and inguinal biopsy showed adenocarcinoma,  IHC was consistent with pancreatic-biliary primary -She has 2 additional small liver lesions and 2 4-18m lung lesions, which are indeterminate but that is suspicious for metastatic disease. -She was seen by surgeon Dr. BBarry Dieneswho thinks she is a not a candidate for surgical resection due to her  metastatic disease. -We previously discussed her cancer is incurable at this stage, the goal of therapy is palliation, to prolong life and improve her quality life -she responded well to first-line cisplatin and gemcitabine, on day 1, 8 every 21 days.  She developed significant cytopenia after 8-9 months therapy and had to stop --She started second line FOLFOX on 05/16/17. Held oxaliplatin with cycle two due to poor toleration.  -Her CT CAP from 07/21/17 showed new peritoneal carcinomatosis with a large complex ovarian mass, primary ovarian cancer vs metastatic cholangiocarcinoma. I recommend IR biopsy of her peritoneal lesion, checked CA125 and it was 100.6 on 07/25/17.  -Her IR biopsy of LLQ omental implant from 08/05/17 showed metastatic disease consistent with cholangiocarcinoma. I [facility] or [facility]. 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  Unfortunately she is not a candidate for immunotherapy based on the MSI, there was one rearrangement in intron is unlikely to predict response to TKI such as crizotinib. She also has ERBB2 amplification, but HER2 FISH was negative, she will unlikely benefit from HER2 antibody such as Herceptin. -She has been on low-dose Capeox for 3 cycles, her overall condition has improved, however she has moderate side effect from Xeloda, her total bilirubin also slightly increased, chem changed back to FOLFOX -She has persistent fatigue, nausea, vomiting, and abdominal cramps, and constipation after cycle 1. Treatment was held on 10/03/17 due to side effects and labs; she had an elevated creatinine Cr 1.44 and hyperbilirubinemia bili 2.0.  -Today on 10/17/17, her creatinine Cr 1.59 and hyperbilirubinemia bili 6.1. I will give her IV fluids today and hold chemo.  -CT AP W Contrast  is scheduled for today at 2:30. I saw her this afternoon to speak about scan results and treatment options.  -I reviewed the CT myself and spoke with the radiologist. I discussed the results with the pt and her family. I will speak with gastroenterology Dr. JArdis Hughsabout possibly putting a stent in the common bile duct to lower her bilirubin. If this procedure is not feasible, there is another procedure to drain the bile through IR.  -I will send a message to Dr. MLeamon Arntabout her condition but once we get it decreased, she can meet with him about clinical trial options.  -I will see her back after the procedure. Hopefully she can have this procedure done this week. In the meantime, she knows to contact [place] any concerns  -F/u is open    2. Obstructive jaundice  -Her total bilirubin has been trending up lately, 6.0 today -This is likely secondary to the obstruction from her cranial carcinoma, periportal adenopathy -I will discuss with Dr. JArdis Hughsto see if ERCP and stent placement is an option  3. Abdominal pain, back pain  -She has had worsening abdominal pain lately due to cancer progression, now much better controlled  -Continue OxyContin 60 mg every 12 hours, and oxycodone 10 mg every 3 hours as needed for pain  -She also has small amount of Dilaudid at home, she will  use it as needed for severe pain  -abdominal pain exacerbated by treatment, will post pone treatment 1 week to stabilize her pain.  -Will continue 60 mg of OxyContin every 12 hrs, and oxycodone as needed, her abdominal pain has been well controlled lately. Refilled today to be filled in 1 week    4. Nausea and anorexia and weight loss -We previously reviewed her nausea management, she will continue Compazine and Zofran -I previously encouraged her to take a nutritional supplement -We previously discussed appetite stimulant, she is on Marinol, dose was increased to 5 mg twice daily last week, appetite improved, will  continue. -she will f/u with dietician  -She has tried Marinol, now on Megace, appetite has improved since her pain is controlled -Continue Megace -Nausea controlled with scopolamine patch, PPI, and zofran  5. Goal of care discussion  -We previously discussed the incurable nature of her cancer, and the overall poor prognosis, especially she has had a major disease progression lately, and she has had several minor treatment.  Her prognosis is guarded -The patient understands the goal of care is palliative. -I have previously recommended DNR/DNI, she will think about it.   6. Constipation -I previously discussed this can worsen with increase in pain medication   -I previously suggested she can take 4-6 tablets of seneca a day. If not enough she can take half bottle magnesium citrate until she has a BM. She can take seneca at least 2 tablets daily to make sure she has regular BM -She will continue senokot with or without miralax daily to maintain BM q1-2 days. She will take mag citrate if no BM in 4 days  7. Peripheral Neuropathy, grade 1 -to her feet, likely secondary to chemotherapy.  8. Anxiety -she takes Xanax about 3 times a week now. Refilled on 10/17/17    Plan -labs reviewed, hold FOLFOX chemo today for elevated creatinine, and hyperbilirubinemia - CT AP today, I reviewed her san findings afterwards  -IVF today -refilled OxyContin to be filled in 1 week and Xanax  -I will send a message to GI Dr. Ardis Hughs about ERCP and stent placement  -F/u is open, will likely see her back after the above procedure    All questions were answered. The patient knows to call the clinic with any problems, questions or concerns.  I spent 30 minutes counseling the patient face to face. The total time spent in the appointment was 35 minutes and more than 50% was on counseling.  This document serves as a record of services personally performed by Truitt Merle, MD. It was created on her behalf by Theresia Bough, a trained medical scribe. The creation of this record is based on the scribe's personal observations and the provider's statements to them.   I have reviewed the above documentation for accuracy and completeness, and I agree with the above.     Truitt Merle, MD 10/17/2017 4:51 PM

## 2017-10-14 NOTE — Progress Notes (Signed)
[facility]  [phone] [phone]  Clinic Follow Up Note   Patient Care Team: Carollee Herter, Alferd Apa, DO as PCP - General Pyrtle, Lajuan Lines, MD as Consulting Physician (Gastroenterology)   Date of Service:  10/17/2017  CHIEF COMPLAINTS:  Follow up metastatic intrahepatic cholangiocarcinoma  Oncology History   Metastatic cholangiocarcinoma to lung [facility])   Staging form: Perihilar Bile Ducts, AJCC 7th Edition   - Clinical stage from 07/07/2016: Stage IVB (T2b, N1, M1) - Signed by Truitt Merle, MD on 07/28/2016      Metastatic cholangiocarcinoma to lung ([place])   05/22/2014 Procedure    Routine screening colonoscopy showed a sessile polyp measuring 4 mm in the sigmoid colon, removed, otherwise negative.      06/29/2016 Imaging    CT chest, abdomen and pelvis showed 2 indeterminant nodule in left and the right lung, 4-18m, indeterminate a hypovascular liver lesions largest in the caudate lobe measuring 2.6 cm, mild abdominal lymphadenopathy in the gastrohepatic ligament and portocaval space.      07/02/2016 Imaging    Abdominal MRI with and without contrast showed 2 lobular lesions in the caudate lobe, and is regular lymph nodes in the gastrohepatic ligament which is adjacent to the liver lesion, suspicious for malignancy. 2 small lesions in the right hepatic lobe are indeterminate, but concerning for metastasis.      07/07/2016 Procedure    EGD was negative, EUS biopsy of the liver and adjacent lymph node      07/07/2016 Initial Diagnosis    Metastatic cholangiocarcinoma to lung ([facility]      07/07/2016 Initial Biopsy    Fine-needle aspiration of the liver lesion in caudate lobe and adjacent lymph nodes both showed metastatic adenocarcinoma.      08/11/2016 - 04/25/2017 Chemotherapy    Cisplatin 25 mg/m, gemcitabine 1000 mg/m, on day one and 8, every 21 days   Cisplatin and gemcitabine on Day 1, 8 every 21 days, with neulasta on day 9, Gemcitabine  dosed reduced to 800 mg/m due to cytopenias, increased to '900mg'$ /m2 on 12/06/2016. Changes to gemcitabine maintenance therapy on day 1 and 8 every 21 days, from 04/18/2017, gemcitabine was reduced dose to 400 mg/m on 04/25/17       10/18/2016 Imaging    CT CAP  IMPRESSION: 1. Again noted are multifocal liver metastasis. Overall there has been no significant interval change in overall volume of liver metastases. 2. Similar appearance of upper abdominal adenopathy 3. Slight decrease in size of small pulmonary metastases.      01/25/2017 Imaging    CT CAP W Contrast 01/25/17 IMPRESSION: 1. Response to therapy. 2. Similar to minimal decrease in pulmonary nodules/metastasis. 3. Improvement in hepatic disease burden. 4. Decrease in abdominal adenopathy. 5. Significantly age advanced atherosclerosis, including within the coronary arteries. 6. Uterine fibroids. 7. Suspect a complex cyst in the left ovary. Recommend attention on follow-up.       04/13/2017 Imaging    CT CAP W Contrast IMPRESSION: 1. The dominant caudate lobe mass is stable in size and appearance, as is the indistinct adenopathy in the porta hepatis adjacent to the caudate lobe, and in the retroperitoneum. Several subtle additional liver lesions are slightly less conspicuous on today' s exam. The pulmonary nodules are stable, and mild omental nodularity in the left lower quadrant is very slightly worsened compared to prior. Overall the burden of malignancy appears similar to prior, and some of the findings may represent effectively treated metastatic lesions. 2. Complex left  adnexal mass, 6.7 by 4.1 cm, enlarging. Pelvic sonography recommended for further characterization. 3. Other imaging findings of potential clinical significance: Aortic Atherosclerosis (ICD10-I70.0). Coronary atherosclerosis. Prominent stool throughout the colon favors constipation. Fibroid uterus.      05/16/2017 - 06/27/2017 Chemotherapy     Second-line chemo FOLFOX every 2 weeks with neulasta after pump DC. She will started with low dose FOLFOX on 05/16/17, due to her neutropenia. Held oxaliplatin with cycle two due to poor toleration. Discontinued after cycle 3 (06/27/17) due to disease progression.       07/20/2017 Imaging    IMPRESSION: CT CHEST  1. Interval development of 2 small new pulmonary nodules concerning for progressive, or new metastatic disease. Recommend continued attention on follow-up imaging. 2. The previously noted right upper and left upper lobe pulmonary nodules remain stable in size consistent with treated metastatic disease. 3. Stable position of right IJ approach power injectable port catheter. 4. Coronary artery calcifications. CT ABD/PELVIS  1. Marked interval enlargement of a complex, enhancing, cystic and solid left ovarian mass which now fills the posterior aspect of the pelvis exerting mass effect on the adjacent rectosigmoid colon and bladder. This likely accounts for the patient's lower abdominal pain and sensation of constipation (there is no significant stool burden within the rectum or sigmoid colon). These findings are highly suspicious for a primary ovarian cystic or mucinous adenocarcinoma. 2. Enlarging omental implants in the left lower quadrant highly concerning for metastatic ovarian carcinoma. 3. New/enlarging subcapsular hepatic lesion may represent progressive cholangio or ovarian carcinoma metastases. 4. Enlarging caudate lobe lesion and aortocaval/portacaval confluent necrotic adenopathy concerning for progressive metastatic cholangiocarcinoma. 5. The segment 6 hepatic lesion remains stable or may be smaller on today's examination. These results will be called to the ordering clinician or representative by the Radiologist Assistant, and communication documented in the PACS or zVision Dashboard.        07/30/2017 Imaging     CT AP W Contrast  07/30/17 IMPRESSION: 1. No substantial interval change in exam. 2. Complex cystic and solid mass filling the cul-de-sac and left adnexal space is similar to prior. There is abnormal soft tissue in the anterior left lower quadrant concerning for metastatic disease. 3. Stable appearance of the caudate lesion in this patient with known cholangiocarcinoma. There is confluent necrotic lymphadenopathy in the porta hepatis, similar to prior with substantial mass-effect on the left renal vein as it enters the IVC. 4. Fibroid change in the uterus. 5. Trace intraperitoneal free fluid.      08/05/2017 Pathology Results    Diagnosis 08/05/17  Peritoneum, biopsy, left lower abdomen METASTATIC ADENOCARCINOMA, CONSISTENT WITH CHOLANGIOCARCINOMA. Microscopic Comment      08/08/2017 Imaging    CT AP WO Contrast 08/08/17 IMPRESSION: 1. No substantial interval change in exam although assessment today limited by lack of intravenous contrast material. 2. Stomach is distended with fluid, similar to prior. 3. Patient's known caudate liver lesion not well demonstrated on today's exam. 4. Similar appearance large complex cystic and solid mass in the cul-de-sac and left adnexal space. 5. Similar appearance abnormal soft tissue in the anterior left lower quadrant. 6. Fibroid change in the uterus.      08/16/2017 -  Chemotherapy    CAPOX: Xeloda '1500mg'$  BID for 1 week on and 1 week off with low dose oxaliplatin starting 08/16/17. Stopped Xeloda 09/19/17 and start 5-FU pump.          HISTORY OF PRESENTING ILLNESS: 07/28/16 Nancy [name] 52 y.o. female is here because  of Her recently diagnosed metastatic glandular carcinoma. She is accompanied by her husband and mother to my clinic today.  She has been having RUQ abdominal pain since mid 04/2016, she was seen by PCP and was treated for gas with GI cocktail and pepcide, which she did not help much. Her pain got worse, and radiates to right shoulder,  she denies significant nausea, or bloating, BM normal, no fever, cough or dyspnea. She recently noticed mild chest discomfort in the low sternum area. She initially had the lab, ultrasound done by her primary care physician, which was unrevealing. She was referred to GI Dr. Hilarie Fredrickson and underwent EGD and CT scan, which showed a lobulated mass in the caudate lobe of liver, with at adjacent adenopathy. He underwent EUS and fine-needle biopsy of the liver mass and lymph nodes, all reviewed adenocarcinoma.  She has lost about 7lbs in the past 3 months. She has mild fatigue, but able to function at home. She has stopped working due to her abdominal pain and fatigue. She takes Norco every 1-3 times a day, and her pain seems not well controlled.  CURRENT THERAPY: Third line CAPOX: Xeloda '1500mg'$  BID for 1 week on and 1 week off with low dose oxaliplatin every 2 weeks starting 08/16/17. Stopped Xeloda 09/19/17 and start 5-FU pump.    INTERIM HISTORY:  Charie Pinkus returns for follow up and is accompanied by her aunt. She saw Lucianne Lei last week for persistent emesis and states that is has continued until 2 days ago. She reports that she hadn't been able to eat well and take her vitamins. She has had improved nausea and a fever of 99.6 last night. She also states she is feeling stressed, overwhelmed, and frustrated. She has been seen by Psychologist, educational. She takes xanax about once a week. She is scheduled to have a CT AP W Contrast today.   On review of systems, pt denies memory loss, cough, SOB or any other complaints at this time. Pertinent positives are listed and detailed within the above HPI.   REVIEW OF SYSTEMS:  Constitutional: Denies fevers, chills or abnormal night sweats(+) decreased appetite  (+) weight loss  Eyes: Denies blurriness of vision, double vision or watery eyes Ears, nose, mouth, throat, and face: Denies mucositis or sore throat Respiratory: Denies dyspnea or wheezes Cardiovascular:  Denies palpitation, chest discomfort or lower extremity swelling Gastrointestinal:  Denies heartburn or change in bowel habits  (+) increase in controlled nausea and abdominal pain, pain now 2/10 with IV dilaudid.  Skin: Denies abnormal skin rashes  Lymphatics: Denies new lymphadenopathy or easy bruising Neurological:Denies numbness, tingling or new weaknesses (+) "chemo brain" (+)  distibular migraine Behavioral/Psych:  (+) anxiety  Musculoskeletal: (+) back pain  All other systems were reviewed with the patient and are negative.   MEDICAL HISTORY:  Past Medical History:  Diagnosis Date  . Allergy    SEASONAL  . Anemia    [employer]  . Atopic eczema   . cholangio ca dx'd 07/12/16  . Fibrocystic breast disease   . GERD (gastroesophageal reflux disease)   . Hx of migraines    seasonal   . Personal history of chemotherapy   . Pneumonia    6 years ago   . TIA (transient ischemic attack)     SURGICAL HISTORY: Past Surgical History:  Procedure Laterality Date  . EUS N/A 07/07/2016   Procedure: UPPER ENDOSCOPIC ULTRASOUND (EUS) RADIAL;  Surgeon: [name], MD;  Location: [facility];  Service: Endoscopy;  Laterality: N/A;  . IR GENERIC HISTORICAL  08/03/2016   IR US GUIDE VASC ACCESS RIGHT 08/03/2016 Corrie Mckusick, DO [facility]  . IR GENERIC HISTORICAL  08/03/2016   IR FLUORO GUIDE PORT INSERTION RIGHT 08/03/2016 Corrie Mckusick, DO [facility]  . MYOMECTOMY  2002    SOCIAL HISTORY: Social History   Socioeconomic History  . Marital status: Married    Spouse name: Not on file  . Number of children: 0  . Years of education: 49  . Highest education level: Not on file  Social Needs  . Financial resource strain: Not on file  . Food insecurity - worry: Not on file  . Food insecurity - inability: Not on file  . Transportation needs - medical: Not on file  . Transportation needs - non-medical: Not on file  Occupational History  . Occupation: HR fml[employer]    Employer: [employer]    Employer: [employer]  Tobacco Use  . Smoking status: Never Smoker  . Smokeless tobacco: Never Used  Substance and Sexual Activity  . Alcohol use: Yes    Comment: maybe 3 times a year  . Drug use: No  . Sexual activity: Yes    Partners: Male  Other Topics Concern  . Not on file  Social History Narrative  . Not on file    FAMILY HISTORY: Family History  Problem Relation Age of Onset  . Gout Mother   . Diabetes Mother   . Hypertension Father   . Diabetes Father   . Breast cancer Paternal Aunt   . Cancer Maternal Grandfather        throat cancer   . Cancer Cousin 72       GI cancer   . Thyroid disease Neg Hx     ALLERGIES:  is allergic to penicillins; contrast media [iodinated diagnostic agents]; and lipitor [atorvastatin].  MEDICATIONS:  Current Outpatient Medications  Medication Sig Dispense Refill  . ALPRAZolam (XANAX) 0.5 MG tablet Take 1 tablet (0.5 mg total) by mouth 3 (three) times daily as needed for anxiety. 30 tablet 0  . aspirin 81 MG tablet Take 81 mg by mouth daily.    . cetirizine (ZYRTEC) 10 MG tablet Take 10 mg by mouth daily as needed for allergies. For allergies     . Cholecalciferol (VITAMIN D3) 5000 UNITS CAPS Take 5,000 Units by mouth daily.     . COLOSTRUM PO Take 1.25 g by mouth 2 (two) times daily.     Marland Kitchen dicyclomine (BENTYL) 20 MG tablet Take 20 mg by mouth 3 (three) times daily as needed for spasms.     Marland Kitchen lidocaine-prilocaine (EMLA) cream Apply 1 application topically as needed. Apply to portacath 1 1/2 hours - 2 hours prior to procedures as needed. 30 g 1  . LORazepam (ATIVAN) 0.5 MG tablet Place under tongue and dissolve every 8 hours as needed for nausea. Do not take Xanax while taking this medication. 30 tablet 0  . megestrol (MEGACE) 400 MG/10ML suspension Take 10 mLs (400 mg total) by mouth daily. 240 mL 0  . Menaquinone-7 (VITAMIN K2) 40 MCG TABS Take 40 mg by mouth daily.     . mometasone (ELOCON) 0.1 % lotion Apply topically  daily. 60 mL 0  . omeprazole (PRILOSEC) 40 MG capsule Take 1 capsule (40 mg total) by mouth 2 (two) times daily before a meal. 60 capsule 1  . ondansetron (ZOFRAN) 8 MG tablet Take 1 tablet (8 mg total) by  mouth every 8 (eight) hours as needed for nausea or vomiting. 30 tablet 3  . oxyCODONE (OXYCONTIN) 60 MG 12 hr tablet Take 60 mg by mouth every 12 (twelve) hours. 60 each 0  . Oxycodone HCl 10 MG TABS Take 1 tablet (10 mg total) by mouth every 3 (three) hours as needed. 100 tablet 0  . polyethylene glycol (MIRALAX / GLYCOLAX) packet Take 17 g by mouth 2 (two) times daily. Hold if you develop diarrhea 100 each 0  . predniSONE (DELTASONE) 50 MG tablet Take 1 tablet 13 hours, 7 hours and 1 hour prior to study 3 tablet 0  . promethazine (PHENERGAN) 25 MG tablet Take 1 tablet (25 mg total) by mouth every 6 (six) hours as needed for nausea or vomiting. 40 tablet 0  . scopolamine (TRANSDERM-SCOP) 1 MG/3DAYS Place 1 patch (1.5 mg total) onto the skin every 3 (three) days. 10 patch 2  . senna (SENOKOT) 8.6 MG TABS tablet Take 2 tablets (17.2 mg total) by mouth 2 (two) times daily. 120 each 0  . HYDROmorphone (DILAUDID) 2 MG tablet Take 1-2 tablets (2-4 mg total) by mouth every 4 (four) hours as needed for severe pain. (Patient not taking: Reported on 10/17/2017) 60 tablet 0   No current facility-administered medications for this visit.    Facility-Administered Medications Ordered in Other Visits  Medication Dose Route Frequency Provider Last Rate Last Dose  . alteplase (CATHFLO ACTIVASE) injection 2 mg  2 mg Intracatheter Once PRN Truitt Merle, MD      . heparin lock flush 100 unit/mL  250 Units Intracatheter Once PRN Truitt Merle, MD      . iopamidol (ISOVUE-300) 61 % injection           . ondansetron (ZOFRAN) injection 8 mg  8 mg Intravenous Once Truitt Merle, MD      . sodium chloride flush (NS) 0.9 % injection 10 mL  10 mL Intracatheter PRN Truitt Merle, MD   10 mL at 09/09/16 1645  . sodium chloride flush (NS)  0.9 % injection 10 mL  10 mL Intravenous PRN Truitt Merle, MD   10 mL at 02/11/17 1515   PHYSICAL EXAMINATION:  ECOG PERFORMANCE STATUS: 2 Vitals:   10/17/17 1008  BP: (!) 143/97  Pulse: 99  Resp: 16  Temp: 98.7 F (37.1 C)  TempSrc: Oral  SpO2: 100%  Weight: 108 lb 12.8 oz (49.4 kg)  Height: '5\' 6"'$  (1.676 m)    GENERAL:alert, no distress and comfortable SKIN: skin color, texture, turgor are normal, no rashes or significant lesions, several hematomas on extremities  EYES: normal, conjunctiva are pink and non-injected, sclera clear OROPHARYNX:no exudate, no erythema and lips, buccal mucosa (+) oral thrush  NECK: supple, thyroid normal size, non-tender, without nodularity LYMPH:  no palpable lymphadenopathy in the cervical, axillary or inguinal LUNGS: clear to auscultation and percussion with normal breathing effort HEART: regular rate & rhythm and no murmurs and no lower extremity edema ABDOMEN: abdomen soft, non-tender and active bowel sounds, no hepatomegaly (+) mild tenderness on the right mid-abdomen with a large abdominal mass in that area. No organomegaly. Musculoskeletal:no cyanosis of digits and no clubbing  PSYCH: alert & oriented x 3 with fluent speech NEURO: no focal motor/sensory deficits  LABORATORY DATA:  I have reviewed the data as listed CBC Latest Ref Rng & Units 10/17/2017 10/10/2017 10/03/2017  WBC 3.9 - 10.3 K/uL 2.8(L) 3.1(L) 2.5(L)  Hemoglobin 11.6 - 15.9 g/dL - - 9.8(L)  Hematocrit 34.8 - 46.6 %  28.8(L) 31.1(L) 29.3(L)  Platelets 145 - 400 K/uL 211 240 138(L)   CMP Latest Ref Rng & Units 10/17/2017 10/10/2017 10/03/2017  Glucose 70 - 140 mg/dL 162(H) 85 88  BUN 7 - 26 mg/dL '12 18 11  '$ Creatinine 0.60 - 1.10 mg/dL 1.59(H) 0.90 1.44(H)  Sodium 136 - 145 mmol/L 134(L) 136 135(L)  Potassium 3.5 - 5.1 mmol/L 3.7 3.5 3.1(L)  Chloride 98 - 109 mmol/L 93(L) 96(L) 96(L)  CO2 22 - 29 mmol/L '28 26 27  '$ Calcium 8.4 - 10.4 mg/dL 9.9 9.2 9.2  Total Protein 6.4 - 8.3 g/dL 7.1  6.6 6.7  Total Bilirubin 0.2 - 1.2 mg/dL 6.1(HH) 4.0(HH) 2.0(H)  Alkaline Phos 40 - 150 U/L 124 61 53  AST 5 - 34 U/L 109(H) 112(H) 68(H)  ALT 0 - 55 U/L 178(H) 204(H) 77(H)    CA19.9 (0-35U/ml) 07/28/2016: 36 08/18/2016: 51 09/13/2016: 56 10/19/2016: 43 11/09/2016: 44 12/06/2016: 41 01/05/2017: 35 02/02/2017: 37 03/07/17: 43 04/18/17: 66 05/16/17: 111 06/08/17: 119 06/18/17: 107 07/18/17: 161 08/16/17: 272 09/19/17: 669  CA 125 07/25/17: 100.6    PATHOLOGY    Diagnosis 08/05/17  Peritoneum, biopsy, left lower abdomen METASTATIC ADENOCARCINOMA, CONSISTENT WITH CHOLANGIOCARCINOMA. Microscopic Comment The adenocarcinoma stains positive for ck7, negative for ER, GATA 3, ck20, cdx2, and TTF-1. Pax 8 (patchy stain). The morphology and immunostain pattern support the above diagnosis. The block B is sent to [facility] for further molecular testing.   Diagnosis 07/07/2016 FINE NEEDLE ASPIRATION, ENDOSCOPIC, LIVER (SPECIMEN 2 OF 2 COLLECTED 07/07/16): MALIGNANT CELLS CONSISTENT WITH ADENOCARCINOMA.  ADDITIONAL INFORMATION: Per request, immunohistochemistry was performed. The cells are positive for cytokeratin 7 and negative for cytokeratin 20 and CDX-2. This excludes a colorectal primary. Pancreaticobiliary and upper GI sources remain in the differential. Called to Dr. Burr Medico on 07/28/2016.  Diagnosis 07/07/2016 FINE NEEDLE ASPIRATION, ENDOSCOPIC, GASTRO-HEPATIC LYMPH NODE (SPECIMEN 1 OF 2 COLLECTED 07/07/16): MALIGNANT CELLS CONSISTENT WITH ADENOCARCINOMA, SEE COMMENT. Preliminary Diagnosis Intraoperative Diagnosis: Few atypical glandular clusters - recommend additional material. 4) Adequate ([facility])   PROCEDURES  Upper EUS 07/07/2016 Dr. Ardis Hughs  - Endoscopically normal UGI tract (good views with standard gastroscope) - Vaguely bordered 4cm mass in the caudate lobe of liver that is confluent with what appear to be enlarged gastrohepatic lymphnodes (these may represent direct tumor  extension however). Preliminary cytology from the lymphnodes shows malignancy, glandular. Preliminary cytology from the liver mass shows the same. Await final cytology results but this is most suspicious for peripheral intrahepatic cholangiocarcinoma with adjacent malignant adenopathy.  COLONOSCOPY 05/22/2014 ENDOSCOPIC IMPRESSION: 1. Sessile polyp measuring 4 mm in size was found in the sigmoid colon; polypectomy was performed with a cold snare 2. The colon mucosa was otherwise normal    RADIOGRAPHIC STUDIES: I have personally reviewed the radiological images as listed and agreed with the findings in the report.  CT AP WO Contrast 08/08/17 IMPRESSION: 1. No substantial interval change in exam although assessment today limited by lack of intravenous contrast material. 2. Stomach is distended with fluid, similar to prior. 3. Patient's known caudate liver lesion not well demonstrated on today's exam. 4. Similar appearance large complex cystic and solid mass in the cul-de-sac and left adnexal space. 5. Similar appearance abnormal soft tissue in the anterior left lower quadrant. 6. Fibroid change in the uterus.  CT AP W Contrast 07/30/17 IMPRESSION: 1. No substantial interval change in exam. 2. Complex cystic and solid mass filling the cul-de-sac and left adnexal space is similar to prior. There is abnormal  soft tissue in the anterior left lower quadrant concerning for metastatic disease. 3. Stable appearance of the caudate lesion in this patient with known cholangiocarcinoma. There is confluent necrotic lymphadenopathy in the porta hepatis, similar to prior with substantial mass-effect on the left renal vein as it enters the IVC. 4. Fibroid change in the uterus. 5. Trace intraperitoneal free fluid.  CT CAP W Contrast 07/21/17 IMPRESSION: CT CHEST 1. Interval development of 2 small new pulmonary nodules concerning for progressive, or new metastatic disease. Recommend continued  attention on follow-up imaging. 2. The previously noted right upper and left upper lobe pulmonary nodules remain stable in size consistent with treated metastatic disease. 3. Stable position of right IJ approach power injectable port catheter. 4. Coronary artery calcifications. CT ABD/PELVIS 1. Marked interval enlargement of a complex, enhancing, cystic and solid left ovarian mass which now fills the posterior aspect of the pelvis exerting mass effect on the adjacent rectosigmoid colon and bladder. This likely accounts for the patient's lower abdominal pain and sensation of constipation (there is no significant stool burden within the rectum or sigmoid colon). These findings are highly suspicious for a primary ovarian cystic or mucinous adenocarcinoma. 2. Enlarging omental implants in the left lower quadrant highly concerning for metastatic ovarian carcinoma. 3. New/enlarging subcapsular hepatic lesion may represent progressive cholangio or ovarian carcinoma metastases. 4. Enlarging caudate lobe lesion and aortocaval/portacaval confluent necrotic adenopathy concerning for progressive metastatic cholangiocarcinoma. 5. The segment 6 hepatic lesion remains stable or may be smaller on today's examination. These results will be called to the ordering clinician or representative by the Radiologist Assistant, and communication documented in the PACS or zVision Dashboard.   CT CAP W Contrast, 04/13/17 IMPRESSION: 1. The dominant caudate lobe mass is stable in size and appearance, as is the indistinct adenopathy in the porta hepatis adjacent to the caudate lobe, and in the retroperitoneum. Several subtle additional liver lesions are slightly less conspicuous on today' s exam. The pulmonary nodules are stable, and mild omental nodularity in the left lower quadrant is very slightly worsened compared to prior. Overall the burden of malignancy appears similar to prior, and some of the findings may represent  effectively treated metastatic lesions. 2. Complex left adnexal mass, 6.7 by 4.1 cm, enlarging. Pelvic sonography recommended for further characterization. 3. Other imaging findings of potential clinical significance: Aortic Atherosclerosis (ICD10-I70.0). Coronary atherosclerosis. Prominent stool throughout the colon favors constipation. Fibroid uterus.  CT CAP W Contrast 01/25/17 IMPRESSION: 1. Response to therapy. 2. Similar to minimal decrease in pulmonary nodules/metastasis. 3. Improvement in hepatic disease burden. 4. Decrease in abdominal adenopathy. 5. Significantly age advanced atherosclerosis, including within the coronary arteries. 6. Uterine fibroids. 7. Suspect a complex cyst in the left ovary. Recommend attention on follow-up.  CT CAP W CONTRAST 10/18/16 IMPRESSION: 1. Again noted are multifocal liver metastasis. Overall there has been no significant interval change in overall volume of liver metastases. 2. Similar appearance of upper abdominal adenopathy 3. Slight decrease in size of small pulmonary metastases.   ASSESSMENT & PLAN:  52 y.o. African-American female, without significant past medical history, presented with abdominal pain  1. 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 -We have previously reviewed her case in our GI tumor board. Based on the scans and endoscopy findings, this is most consistent with cholangiocarcinoma. Her liver and inguinal biopsy showed adenocarcinoma,  IHC was consistent with pancreatic-biliary primary -She has 2 additional small liver lesions and 2 4-58m lung lesions, which are indeterminate but that is suspicious for metastatic disease. -She was seen by surgeon Dr. BBarry Dieneswho thinks she is a not a candidate for surgical resection due to her  metastatic disease. -We previously discussed her cancer is incurable at this stage, the goal of therapy is palliation, to prolong life and improve her quality life -she responded well to first-line cisplatin and gemcitabine, on day 1, 8 every 21 days.  She developed significant cytopenia after 8-9 months therapy and had to stop --She started second line FOLFOX on 05/16/17. Held oxaliplatin with cycle two due to poor toleration.  -Her CT CAP from 07/21/17 showed new peritoneal carcinomatosis with a large complex ovarian mass, primary ovarian cancer vs metastatic cholangiocarcinoma. I recommend IR biopsy of her peritoneal lesion, checked CA125 and it was 100.6 on 07/25/17.  -Her IR biopsy of LLQ omental implant from 08/05/17 showed metastatic disease consistent with cholangiocarcinoma. I have discussed [facility] other options or clinical trial. 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  Unfortunately she is not a candidate for immunotherapy based on the MSI, there was one rearrangement in intron is unlikely to predict response to TKI such as crizotinib. She also has ERBB2 amplification, but HER2 FISH was negative, she will unlikely benefit from HER2 antibody such as Herceptin. -She has been on low-dose Capeox for 3 cycles, her overall condition has improved, however she has moderate side effect from Xeloda, her total bilirubin also slightly increased, chem changed back to FOLFOX -She has persistent fatigue, nausea, vomiting, and abdominal cramps, and constipation after cycle 1. Treatment was held on 10/03/17 due to side effects and labs; she had an elevated creatinine Cr 1.44 and hyperbilirubinemia bili 2.0.  -Today on 10/17/17, her creatinine Cr 1.59 and hyperbilirubinemia bili 6.1. I will give her IV fluids today and hold chemo.  -CT AP W Contrast  is scheduled for today at 2:30. I saw her this afternoon to speak about scan results and treatment options.  -I reviewed the CT myself and spoke with the radiologist. I discussed the results with the pt and her family. I will speak with gastroenterology Dr. JArdis Hughsabout possibly putting a stent in the common bile duct to lower her bilirubin. If this procedure is not feasible, there is another procedure to drain the bile through IR.  -I will send a message to Dr. MLeamon Arntabout her condition but once we get it decreased, she can meet with him about clinical trial options.  -I will see her back after the procedure. Hopefully she can have this procedure done this week. In the meantime, she knows to contact [place] any concerns  -F/u is open    2. Obstructive jaundice  -Her total bilirubin has been trending up lately, 6.0 today -This is likely secondary to the obstruction from her cranial carcinoma, periportal adenopathy -I will discuss with Dr. JArdis Hughsto see if ERCP and stent placement is an option  3. Abdominal pain, back pain  -She has had worsening abdominal pain lately due to cancer progression, now much better controlled  -Continue OxyContin 60 mg every 12 hours, and oxycodone 10 mg every 3 hours as needed for pain  -She also has small amount of Dilaudid at home, she will  use it as needed for severe pain  -abdominal pain exacerbated by treatment, will post pone treatment 1 week to stabilize her pain.  -Will continue 60 mg of OxyContin every 12 hrs, and oxycodone as needed, her abdominal pain has been well controlled lately. Refilled today to be filled in 1 week    4. Nausea and anorexia and weight loss -We previously reviewed her nausea management, she will continue Compazine and Zofran -I previously encouraged her to take a nutritional supplement -We previously discussed appetite stimulant, she is on Marinol, dose was increased to 5 mg twice daily last week, appetite improved, will  continue. -she will f/u with dietician  -She has tried Marinol, now on Megace, appetite has improved since her pain is controlled -Continue Megace -Nausea controlled with scopolamine patch, PPI, and zofran  5. Goal of care discussion  -We previously discussed the incurable nature of her cancer, and the overall poor prognosis, especially she has had a major disease progression lately, and she has had several minor treatment.  Her prognosis is guarded -The patient understands the goal of care is palliative. -I have previously recommended DNR/DNI, she will think about it.   6. Constipation -I previously discussed this can worsen with increase in pain medication   -I previously suggested she can take 4-6 tablets of seneca a day. If not enough she can take half bottle magnesium citrate until she has a BM. She can take seneca at least 2 tablets daily to make sure she has regular BM -She will continue senokot with or without miralax daily to maintain BM q1-2 days. She will take mag citrate if no BM in 4 days  7. Peripheral Neuropathy, grade 1 -to her feet, likely secondary to chemotherapy.  8. Anxiety -she takes Xanax about 3 times a week now. Refilled on 10/17/17    Plan -labs reviewed, hold FOLFOX chemo today for elevated creatinine, and hyperbilirubinemia - CT AP today, I reviewed her san findings afterwards  -IVF today -refilled OxyContin to be filled in 1 week and Xanax  -I will send a message to GI Dr. Ardis Hughs about ERCP and stent placement  -F/u is open, will likely see her back after the above procedure    All questions were answered. The patient knows to call the clinic with any problems, questions or concerns.  I spent 30 minutes counseling the patient face to face. The total time spent in the appointment was 35 minutes and more than 50% was on counseling.  This document serves as a record of services personally performed by Truitt Merle, MD. It was created on her behalf by Theresia Bough, a trained medical scribe. The creation of this record is based on the scribe's personal observations and the provider's statements to them.   I have reviewed the above documentation for accuracy and completeness, and I agree with the above.     Truitt Merle, MD 10/17/2017 4:51 PM

## 2017-10-16 ENCOUNTER — Other Ambulatory Visit: Payer: Self-pay | Admitting: Hematology

## 2017-10-16 DIAGNOSIS — C7800 Secondary malignant neoplasm of unspecified lung: Principal | ICD-10-CM

## 2017-10-16 DIAGNOSIS — C221 Intrahepatic bile duct carcinoma: Secondary | ICD-10-CM

## 2017-10-17 ENCOUNTER — Ambulatory Visit (HOSPITAL_COMMUNITY)
Admission: RE | Admit: 2017-10-17 | Discharge: 2017-10-17 | Disposition: A | Discharge status: Home / Self Care | Payer: Federal, State, Local not specified - PPO | Source: Ambulatory Visit | Attending: Hematology | Admitting: Hematology

## 2017-10-17 ENCOUNTER — Other Ambulatory Visit: Payer: Federal, State, Local not specified - PPO

## 2017-10-17 ENCOUNTER — Inpatient Hospital Stay: Payer: Federal, State, Local not specified - PPO

## 2017-10-17 ENCOUNTER — Encounter: Payer: Self-pay | Admitting: Hematology

## 2017-10-17 ENCOUNTER — Telehealth: Payer: Self-pay | Admitting: Medical Oncology

## 2017-10-17 ENCOUNTER — Inpatient Hospital Stay (HOSPITAL_BASED_OUTPATIENT_CLINIC_OR_DEPARTMENT_OTHER): Payer: Federal, State, Local not specified - PPO | Admitting: Hematology

## 2017-10-17 VITALS — BP 143/97 | HR 99 | Temp 98.7°F | Resp 16 | Ht 66.0 in | Wt 108.8 lb

## 2017-10-17 DIAGNOSIS — Z7189 Other specified counseling: Secondary | ICD-10-CM | POA: Diagnosis not present

## 2017-10-17 DIAGNOSIS — R110 Nausea: Secondary | ICD-10-CM

## 2017-10-17 DIAGNOSIS — F419 Anxiety disorder, unspecified: Secondary | ICD-10-CM | POA: Diagnosis not present

## 2017-10-17 DIAGNOSIS — R590 Localized enlarged lymph nodes: Secondary | ICD-10-CM | POA: Diagnosis not present

## 2017-10-17 DIAGNOSIS — T451X5A Adverse effect of antineoplastic and immunosuppressive drugs, initial encounter: Secondary | ICD-10-CM

## 2017-10-17 DIAGNOSIS — C7800 Secondary malignant neoplasm of unspecified lung: Secondary | ICD-10-CM

## 2017-10-17 DIAGNOSIS — C787 Secondary malignant neoplasm of liver and intrahepatic bile duct: Secondary | ICD-10-CM | POA: Insufficient documentation

## 2017-10-17 DIAGNOSIS — K831 Obstruction of bile duct: Secondary | ICD-10-CM | POA: Diagnosis not present

## 2017-10-17 DIAGNOSIS — C221 Intrahepatic bile duct carcinoma: Secondary | ICD-10-CM

## 2017-10-17 DIAGNOSIS — C786 Secondary malignant neoplasm of retroperitoneum and peritoneum: Secondary | ICD-10-CM | POA: Diagnosis not present

## 2017-10-17 DIAGNOSIS — R1900 Intra-abdominal and pelvic swelling, mass and lump, unspecified site: Secondary | ICD-10-CM | POA: Insufficient documentation

## 2017-10-17 DIAGNOSIS — C7801 Secondary malignant neoplasm of right lung: Principal | ICD-10-CM

## 2017-10-17 DIAGNOSIS — R911 Solitary pulmonary nodule: Secondary | ICD-10-CM | POA: Diagnosis not present

## 2017-10-17 DIAGNOSIS — D6481 Anemia due to antineoplastic chemotherapy: Secondary | ICD-10-CM

## 2017-10-17 LAB — COMPREHENSIVE METABOLIC PANEL
ALT: 178 U/L — ABNORMAL HIGH (ref 0–55)
AST: 109 U/L — ABNORMAL HIGH (ref 5–34)
Albumin: 3.8 g/dL (ref 3.5–5.0)
Alkaline Phosphatase: 124 U/L (ref 40–150)
Anion gap: 13 — ABNORMAL HIGH (ref 3–11)
BUN: 12 mg/dL (ref 7–26)
CO2: 28 mmol/L (ref 22–29)
Calcium: 9.9 mg/dL (ref 8.4–10.4)
Chloride: 93 mmol/L — ABNORMAL LOW (ref 98–109)
Creatinine, Ser: 1.59 mg/dL — ABNORMAL HIGH (ref 0.60–1.10)
GFR calc Af Amer: 43 mL/min — ABNORMAL LOW (ref >60)
GFR calc non Af Amer: 37 mL/min — ABNORMAL LOW (ref >60)
Glucose, Bld: 162 mg/dL — ABNORMAL HIGH (ref 70–140)
Potassium: 3.7 mmol/L (ref 3.5–5.1)
Sodium: 134 mmol/L — ABNORMAL LOW (ref 136–145)
Total Bilirubin: 6.1 mg/dL (ref 0.2–1.2)
Total Protein: 7.1 g/dL (ref 6.4–8.3)

## 2017-10-17 LAB — CBC WITH DIFFERENTIAL (CANCER CENTER ONLY)
Basophils Absolute: 0 10*3/uL (ref 0.0–0.1)
Basophils Relative: 0 %
Eosinophils Absolute: 0 10*3/uL (ref 0.0–0.5)
Eosinophils Relative: 0 %
HCT: 28.8 % — ABNORMAL LOW (ref 34.8–46.6)
Hemoglobin: 9.7 g/dL — ABNORMAL LOW (ref 11.6–15.9)
Lymphocytes Relative: 12 %
Lymphs Abs: 0.3 10*3/uL — ABNORMAL LOW (ref 0.9–3.3)
MCH: 29.1 pg (ref 25.1–34.0)
MCHC: 33.8 g/dL (ref 31.5–36.0)
MCV: 86.2 fL (ref 79.5–101.0)
Monocytes Absolute: 0 10*3/uL — ABNORMAL LOW (ref 0.1–0.9)
Monocytes Relative: 1 %
Neutro Abs: 2.4 10*3/uL (ref 1.5–6.5)
Neutrophils Relative %: 87 %
Platelet Count: 211 10*3/uL (ref 145–400)
RBC: 3.34 MIL/uL — ABNORMAL LOW (ref 3.70–5.45)
RDW: 18.2 % — ABNORMAL HIGH (ref 11.2–14.5)
WBC Count: 2.8 10*3/uL — ABNORMAL LOW (ref 3.9–10.3)

## 2017-10-17 MED ORDER — HEPARIN SOD (PORK) LOCK FLUSH 100 UNIT/ML IV SOLN
250.0000 [IU] | Freq: Once | INTRAVENOUS | Status: DC | PRN
  Filled 2017-10-17: qty 5

## 2017-10-17 MED ORDER — SODIUM CHLORIDE 0.9 % IV SOLN
Freq: Once | INTRAVENOUS | Status: AC
  Administered 2017-10-17: 12:00:00 via INTRAVENOUS

## 2017-10-17 MED ORDER — OXYCODONE HCL ER 60 MG PO T12A: 60.0000 mg | EXTENDED_RELEASE_TABLET | Freq: Two times a day (BID) | ORAL | 0 refills | Status: DC

## 2017-10-17 MED ORDER — ALPRAZOLAM 0.5 MG PO TABS: 0.5000 mg | ORAL_TABLET | Freq: Three times a day (TID) | ORAL | 0 refills | Status: AC | PRN

## 2017-10-17 MED ORDER — IOPAMIDOL (ISOVUE-300) INJECTION 61%
100.0000 mL | Freq: Once | INTRAVENOUS | Status: AC | PRN
  Administered 2017-10-17: 80 mL via INTRAVENOUS

## 2017-10-17 MED ORDER — HEPARIN SOD (PORK) LOCK FLUSH 100 UNIT/ML IV SOLN
INTRAVENOUS | Status: AC
  Administered 2017-10-17: 500 [IU] via INTRAVENOUS
  Filled 2017-10-17: qty 5

## 2017-10-17 MED ORDER — SODIUM CHLORIDE 0.9% FLUSH
10.0000 mL | INTRAVENOUS | Status: DC | PRN
  Administered 2017-10-17: 10 mL
  Filled 2017-10-17: qty 10

## 2017-10-17 MED ORDER — IOPAMIDOL (ISOVUE-300) INJECTION 61%
INTRAVENOUS | Status: AC
  Filled 2017-10-17: qty 100

## 2017-10-17 MED ORDER — HEPARIN SOD (PORK) LOCK FLUSH 100 UNIT/ML IV SOLN
500.0000 [IU] | Freq: Once | INTRAVENOUS | Status: AC
  Administered 2017-10-17: 500 [IU] via INTRAVENOUS

## 2017-10-17 NOTE — Patient Instructions (Signed)
Dehydration, Adult Dehydration is when there is not enough fluid or water in your body. This happens when you lose more fluids than you take in. Dehydration can range from mild to very bad. It should be treated right away to keep it from getting very bad. Symptoms of mild dehydration may include:  Thirst.  Dry lips.  Slightly dry mouth.  Dry, warm skin.  Dizziness. Symptoms of moderate dehydration may include:  Very dry mouth.  Muscle cramps.  Dark pee (urine). Pee may be the color of tea.  Your body making less pee.  Your eyes making fewer tears.  Heartbeat that is uneven or faster than normal (palpitations).  Headache.  Light-headedness, especially when you stand up from sitting.  Fainting (syncope). Symptoms of very bad dehydration may include:  Changes in skin, such as: ? Cold and clammy skin. ? Blotchy (mottled) or pale skin. ? Skin that does not quickly return to normal after being lightly pinched and let go (poor skin turgor).  Changes in body fluids, such as: ? Feeling very thirsty. ? Your eyes making fewer tears. ? Not sweating when body temperature is high, such as in hot weather. ? Your body making very little pee.  Changes in vital signs, such as: ? Weak pulse. ? Pulse that is more than 100 beats a minute when you are sitting still. ? Fast breathing. ? Low blood pressure.  Other changes, such as: ? Sunken eyes. ? Cold hands and feet. ? Confusion. ? Lack of energy (lethargy). ? Trouble waking up from sleep. ? Short-term weight loss. ? Unconsciousness. Follow these instructions at home:  If told by your doctor, drink an ORS: ? Make an ORS by using instructions on the package. ? Start by drinking small amounts, about  cup (120 mL) every 5-10 minutes. ? Slowly drink more until you have had the amount that your doctor said to have.  Drink enough clear fluid to keep your pee clear or pale yellow. If you were told to drink an ORS, finish the ORS  first, then start slowly drinking clear fluids. Drink fluids such as: ? Water. Do not drink only water by itself. Doing that can make the salt (sodium) level in your body get too low (hyponatremia). ? Ice chips. ? Fruit juice that you have added water to (diluted). ? Low-calorie sports drinks.  Avoid: ? Alcohol. ? Drinks that have a lot of sugar. These include high-calorie sports drinks, fruit juice that does not have water added, and soda. ? Caffeine. ? Foods that are greasy or have a lot of fat or sugar.  Take over-the-counter and prescription medicines only as told by your doctor.  Do not take salt tablets. Doing that can make the salt level in your body get too high (hypernatremia).  Eat foods that have minerals (electrolytes). Examples include bananas, oranges, potatoes, tomatoes, and spinach.  Keep all follow-up visits as told by your doctor. This is important. Contact a doctor if:  You have belly (abdominal) pain that: ? Gets worse. ? Stays in one area (localizes).  You have a rash.  You have a stiff neck.  You get angry or annoyed more easily than normal (irritability).  You are more sleepy than normal.  You have a harder time waking up than normal.  You feel: ? Weak. ? Dizzy. ? Very thirsty.  You have peed (urinated) only a small amount of very dark pee during 6-8 hours. Get help right away if:  You have symptoms of   very bad dehydration.  You cannot drink fluids without throwing up (vomiting).  Your symptoms get worse with treatment.  You have a fever.  You have a very bad headache.  You are throwing up or having watery poop (diarrhea) and it: ? Gets worse. ? Does not go away.  You have blood or something green (bile) in your throw-up.  You have blood in your poop (stool). This may cause poop to look black and tarry.  You have not peed in 6-8 hours.  You pass out (faint).  Your heart rate when you are sitting still is more than 100 beats a  minute.  You have trouble breathing. This information is not intended to replace advice given to you by your health care provider. Make sure you discuss any questions you have with your health care provider. Document Released: 06/12/2009 Document Revised: 03/05/2016 Document Reviewed: 10/10/2015 Elsevier Interactive Patient Education  2018 [employer]  

## 2017-10-17 NOTE — Patient Instructions (Signed)
Implanted Port Home Guide An implanted port is a type of central line that is placed under the skin. Central lines are used to provide IV access when treatment or nutrition needs to be given through a person's veins. Implanted ports are used for long-term IV access. An implanted port may be placed because:  You need IV medicine that would be irritating to the small veins in your hands or arms.  You need long-term IV medicines, such as antibiotics.  You need IV nutrition for a long period.  You need frequent blood draws for lab tests.  You need dialysis.  Implanted ports are usually placed in the chest area, but they can also be placed in the upper arm, the abdomen, or the leg. An implanted port has two main parts:  Reservoir. The reservoir is round and will appear as a small, raised area under your skin. The reservoir is the part where a needle is inserted to give medicines or draw blood.  Catheter. The catheter is a thin, flexible tube that extends from the reservoir. The catheter is placed into a large vein. Medicine that is inserted into the reservoir goes into the catheter and then into the vein.  How will I care for my incision site? Do not get the incision site wet. Bathe or shower as directed by your health care provider. How is my port accessed? Special steps must be taken to access the port:  Before the port is accessed, a numbing cream can be placed on the skin. This helps numb the skin over the port site.  Your health care provider uses a sterile technique to access the port. ? Your health care provider must put on a mask and sterile gloves. ? The skin over your port is cleaned carefully with an antiseptic and allowed to dry. ? The port is gently pinched between sterile gloves, and a needle is inserted into the port.  Only "non-coring" port needles should be used to access the port. Once the port is accessed, a blood return should be checked. This helps ensure that the port  is in the vein and is not clogged.  If your port needs to remain accessed for a constant infusion, a clear (transparent) bandage will be placed over the needle site. The bandage and needle will need to be changed every week, or as directed by your health care provider.  Keep the bandage covering the needle clean and dry. Do not get it wet. Follow your health care provider's instructions on how to take a shower or bath while the port is accessed.  If your port does not need to stay accessed, no bandage is needed over the port.  What is flushing? Flushing helps keep the port from getting clogged. Follow your health care provider's instructions on how and when to flush the port. Ports are usually flushed with saline solution or a medicine called heparin. The need for flushing will depend on how the port is used.  If the port is used for intermittent medicines or blood draws, the port will need to be flushed: ? After medicines have been given. ? After blood has been drawn. ? As part of routine maintenance.  If a constant infusion is running, the port may not need to be flushed.  How long will my port stay implanted? The port can stay in for as long as your health care provider thinks it is needed. When it is time for the port to come out, surgery will be   done to remove it. The procedure is similar to the one performed when the port was put in. When should I seek immediate medical care? When you have an implanted port, you should seek immediate medical care if:  You notice a bad smell coming from the incision site.  You have swelling, redness, or drainage at the incision site.  You have more swelling or pain at the port site or the surrounding area.  You have a fever that is not controlled with medicine.  This information is not intended to replace advice given to you by your health care provider. Make sure you discuss any questions you have with your health care provider. Document  Released: 08/16/2005 Document Revised: 01/22/2016 Document Reviewed: 04/23/2013 Elsevier Interactive Patient Education  2017 Else[employer] Inc.  

## 2017-10-17 NOTE — Telephone Encounter (Signed)
CT completed, Lattie Haw asking if she should remove huber needle- I told her yes.

## 2017-10-18 ENCOUNTER — Other Ambulatory Visit: Payer: Self-pay

## 2017-10-18 ENCOUNTER — Encounter (HOSPITAL_COMMUNITY): Payer: Self-pay

## 2017-10-18 ENCOUNTER — Telehealth: Payer: Self-pay

## 2017-10-18 DIAGNOSIS — T859XXA Unspecified complication of internal prosthetic device, implant and graft, initial encounter: Secondary | ICD-10-CM

## 2017-10-18 LAB — CANCER ANTIGEN 19-9: CA 19-9: 1826 U/mL — ABNORMAL HIGH (ref 0–35)

## 2017-10-18 NOTE — Telephone Encounter (Signed)
The pt has been scheduled for 10/20/17 at 3 pm for ERCP at [facility] .  She will need to arrive at 130 pm and NPO 4 hours prior.  Left message on machine to call back

## 2017-10-18 NOTE — Telephone Encounter (Signed)
-----   Message from [name], MD sent at 10/18/2017 11:34 AM EST ----- Chong Sicilian, This is the patient we spoke about for ERCP, stent this Thursday at [place], [place]; Juluis Rainier We'll get her in.

## 2017-10-19 NOTE — Telephone Encounter (Signed)
Left message on machine to call back  

## 2017-10-20 ENCOUNTER — Ambulatory Visit (HOSPITAL_COMMUNITY)
Admission: RE | Admit: 2017-10-20 | Discharge: 2017-10-20 | Disposition: A | Discharge status: Home / Self Care | Payer: Federal, State, Local not specified - PPO | Source: Ambulatory Visit | Attending: Gastroenterology | Admitting: Gastroenterology

## 2017-10-20 ENCOUNTER — Encounter (HOSPITAL_COMMUNITY)
Admission: RE | Disposition: A | Discharge status: Home / Self Care | Payer: Self-pay | Source: Ambulatory Visit | Attending: Gastroenterology

## 2017-10-20 ENCOUNTER — Ambulatory Visit (HOSPITAL_COMMUNITY): Payer: Federal, State, Local not specified - PPO

## 2017-10-20 ENCOUNTER — Encounter (HOSPITAL_COMMUNITY): Payer: Self-pay | Admitting: Gastroenterology

## 2017-10-20 ENCOUNTER — Ambulatory Visit (HOSPITAL_COMMUNITY): Payer: Federal, State, Local not specified - PPO | Admitting: Anesthesiology

## 2017-10-20 DIAGNOSIS — I2510 Atherosclerotic heart disease of native coronary artery without angina pectoris: Secondary | ICD-10-CM | POA: Diagnosis not present

## 2017-10-20 DIAGNOSIS — F419 Anxiety disorder, unspecified: Secondary | ICD-10-CM | POA: Diagnosis not present

## 2017-10-20 DIAGNOSIS — G629 Polyneuropathy, unspecified: Secondary | ICD-10-CM | POA: Diagnosis not present

## 2017-10-20 DIAGNOSIS — Z880 Allergy status to penicillin: Secondary | ICD-10-CM | POA: Insufficient documentation

## 2017-10-20 DIAGNOSIS — C786 Secondary malignant neoplasm of retroperitoneum and peritoneum: Secondary | ICD-10-CM | POA: Diagnosis not present

## 2017-10-20 DIAGNOSIS — C787 Secondary malignant neoplasm of liver and intrahepatic bile duct: Secondary | ICD-10-CM | POA: Diagnosis not present

## 2017-10-20 DIAGNOSIS — Z8673 Personal history of transient ischemic attack (TIA), and cerebral infarction without residual deficits: Secondary | ICD-10-CM | POA: Diagnosis not present

## 2017-10-20 DIAGNOSIS — Z7982 Long term (current) use of aspirin: Secondary | ICD-10-CM | POA: Diagnosis not present

## 2017-10-20 DIAGNOSIS — R17 Unspecified jaundice: Secondary | ICD-10-CM

## 2017-10-20 DIAGNOSIS — Z79899 Other long term (current) drug therapy: Secondary | ICD-10-CM | POA: Insufficient documentation

## 2017-10-20 DIAGNOSIS — C7800 Secondary malignant neoplasm of unspecified lung: Secondary | ICD-10-CM | POA: Diagnosis not present

## 2017-10-20 DIAGNOSIS — T859XXA Unspecified complication of internal prosthetic device, implant and graft, initial encounter: Secondary | ICD-10-CM

## 2017-10-20 DIAGNOSIS — K219 Gastro-esophageal reflux disease without esophagitis: Secondary | ICD-10-CM | POA: Diagnosis not present

## 2017-10-20 DIAGNOSIS — K831 Obstruction of bile duct: Secondary | ICD-10-CM | POA: Diagnosis not present

## 2017-10-20 DIAGNOSIS — C569 Malignant neoplasm of unspecified ovary: Secondary | ICD-10-CM | POA: Diagnosis not present

## 2017-10-20 DIAGNOSIS — C779 Secondary and unspecified malignant neoplasm of lymph node, unspecified: Secondary | ICD-10-CM | POA: Diagnosis not present

## 2017-10-20 DIAGNOSIS — D709 Neutropenia, unspecified: Secondary | ICD-10-CM | POA: Insufficient documentation

## 2017-10-20 HISTORY — DX: Family history of other specified conditions: Z84.89

## 2017-10-20 HISTORY — PX: ENDOSCOPIC RETROGRADE CHOLANGIOPANCREATOGRAPHY (ERCP) WITH PROPOFOL: SHX5810

## 2017-10-20 HISTORY — DX: Major depressive disorder, single episode, unspecified: F32.9

## 2017-10-20 HISTORY — DX: Anxiety disorder, unspecified: F41.9

## 2017-10-20 HISTORY — DX: Depression, unspecified: F32.A

## 2017-10-20 SURGERY — ENDOSCOPIC RETROGRADE CHOLANGIOPANCREATOGRAPHY (ERCP) WITH PROPOFOL
Anesthesia: General

## 2017-10-20 MED ORDER — LACTATED RINGERS IV SOLN
INTRAVENOUS | Status: DC | PRN
  Administered 2017-10-20: 15:00:00 via INTRAVENOUS

## 2017-10-20 MED ORDER — MIDAZOLAM HCL 2 MG/2ML IJ SOLN
INTRAMUSCULAR | Status: AC
  Filled 2017-10-20: qty 2

## 2017-10-20 MED ORDER — INDOMETHACIN 50 MG RE SUPP
RECTAL | Status: DC | PRN
  Administered 2017-10-20: 100 mg via RECTAL

## 2017-10-20 MED ORDER — FENTANYL CITRATE (PF) 100 MCG/2ML IJ SOLN
INTRAMUSCULAR | Status: AC
  Filled 2017-10-20: qty 2

## 2017-10-20 MED ORDER — GLUCAGON HCL RDNA (DIAGNOSTIC) 1 MG IJ SOLR
INTRAMUSCULAR | Status: AC
  Filled 2017-10-20: qty 1

## 2017-10-20 MED ORDER — ONDANSETRON HCL 4 MG/2ML IJ SOLN
INTRAMUSCULAR | Status: DC | PRN
  Administered 2017-10-20: 4 mg via INTRAVENOUS

## 2017-10-20 MED ORDER — PROPOFOL 10 MG/ML IV BOLUS
INTRAVENOUS | Status: DC | PRN
  Administered 2017-10-20: 80 mg via INTRAVENOUS

## 2017-10-20 MED ORDER — SODIUM CHLORIDE 0.9 % IV SOLN
INTRAVENOUS | Status: DC | PRN
  Administered 2017-10-20: 60 mL

## 2017-10-20 MED ORDER — LABETALOL HCL 5 MG/ML IV SOLN
10.0000 mg | Freq: Once | INTRAVENOUS | Status: AC
  Administered 2017-10-20: 10 mg via INTRAVENOUS

## 2017-10-20 MED ORDER — SUGAMMADEX SODIUM 200 MG/2ML IV SOLN
INTRAVENOUS | Status: DC | PRN
  Administered 2017-10-20: 100 mg via INTRAVENOUS

## 2017-10-20 MED ORDER — FENTANYL CITRATE (PF) 100 MCG/2ML IJ SOLN
25.0000 ug | INTRAMUSCULAR | Status: DC | PRN
Start: 2017-10-20 — End: 2017-10-20

## 2017-10-20 MED ORDER — OXYCODONE HCL 5 MG/5ML PO SOLN: 5.0000 mg | Freq: Once | ORAL | Status: DC | PRN

## 2017-10-20 MED ORDER — ESMOLOL HCL 100 MG/10ML IV SOLN
INTRAVENOUS | Status: DC | PRN
  Administered 2017-10-20 (×2): 10 mg via INTRAVENOUS

## 2017-10-20 MED ORDER — OXYCODONE HCL 5 MG PO TABS: 5.0000 mg | ORAL_TABLET | Freq: Once | ORAL | Status: DC | PRN

## 2017-10-20 MED ORDER — CIPROFLOXACIN IN D5W 400 MG/200ML IV SOLN
INTRAVENOUS | Status: AC
  Filled 2017-10-20: qty 200

## 2017-10-20 MED ORDER — DEXAMETHASONE SODIUM PHOSPHATE 10 MG/ML IJ SOLN
INTRAMUSCULAR | Status: DC | PRN
  Administered 2017-10-20: 8 mg via INTRAVENOUS

## 2017-10-20 MED ORDER — PHENYLEPHRINE 40 MCG/ML (10ML) SYRINGE FOR IV PUSH (FOR BLOOD PRESSURE SUPPORT)
PREFILLED_SYRINGE | INTRAVENOUS | Status: DC | PRN
  Administered 2017-10-20: 40 ug via INTRAVENOUS
  Administered 2017-10-20 (×2): 80 ug via INTRAVENOUS

## 2017-10-20 MED ORDER — MIDAZOLAM HCL 5 MG/5ML IJ SOLN
INTRAMUSCULAR | Status: DC | PRN
  Administered 2017-10-20: 2 mg via INTRAVENOUS

## 2017-10-20 MED ORDER — SODIUM CHLORIDE 0.9 % IV SOLN: INTRAVENOUS | Status: DC

## 2017-10-20 MED ORDER — INDOMETHACIN 50 MG RE SUPP
RECTAL | Status: AC
  Filled 2017-10-20: qty 2

## 2017-10-20 MED ORDER — FENTANYL CITRATE (PF) 100 MCG/2ML IJ SOLN
INTRAMUSCULAR | Status: DC | PRN
  Administered 2017-10-20 (×2): 50 ug via INTRAVENOUS
  Administered 2017-10-20: 100 ug via INTRAVENOUS

## 2017-10-20 MED ORDER — LABETALOL HCL 5 MG/ML IV SOLN
INTRAVENOUS | Status: AC
  Administered 2017-10-20: 10 mg via INTRAVENOUS
  Filled 2017-10-20: qty 4

## 2017-10-20 MED ORDER — ROCURONIUM BROMIDE 10 MG/ML (PF) SYRINGE
PREFILLED_SYRINGE | INTRAVENOUS | Status: DC | PRN
  Administered 2017-10-20: 30 mg via INTRAVENOUS

## 2017-10-20 MED ORDER — PROPOFOL 10 MG/ML IV BOLUS
INTRAVENOUS | Status: AC
  Filled 2017-10-20: qty 20

## 2017-10-20 MED ORDER — HEPARIN SOD (PORK) LOCK FLUSH 100 UNIT/ML IV SOLN
500.0000 [IU] | Freq: Once | INTRAVENOUS | Status: DC
  Filled 2017-10-20: qty 5

## 2017-10-20 MED ORDER — PROMETHAZINE HCL 25 MG/ML IJ SOLN: 6.2500 mg | INTRAMUSCULAR | Status: DC | PRN

## 2017-10-20 NOTE — Telephone Encounter (Signed)
ERCP scheduled, pt instructed and medications reviewed.    Patient to call with any questions or concerns.

## 2017-10-20 NOTE — Interval H&P Note (Signed)
History and Physical Interval Note:  10/20/2017 2:03 PM  Nancy [name]  has presented today for surgery, with the diagnosis of stent palcement  The various methods of treatment have been discussed with the patient and family. After consideration of risks, benefits and other options for treatment, the patient has consented to  Procedure(s): ENDOSCOPIC RETROGRADE CHOLANGIOPANCREATOGRAPHY (ERCP) WITH PROPOFOL (N/A) as a surgical intervention .  The patient's history has been reviewed, patient examined, no change in status, stable for surgery.  I have reviewed the patient's chart and labs.  Questions were answered to the patient's satisfaction.     [name]

## 2017-10-20 NOTE — Transfer of Care (Signed)
Immediate Anesthesia Transfer of Care Note  Patient: Nancy [name]  Procedure(s) Performed: Procedure(s): ENDOSCOPIC RETROGRADE CHOLANGIOPANCREATOGRAPHY (ERCP) WITH PROPOFOL (N/A)  Patient Location: PACU  Anesthesia Type:General  Level of Consciousness:  sedated, patient cooperative and responds to stimulation  Airway & Oxygen Therapy:Patient Spontanous Breathing and Patient connected to face mask oxgen  Post-op Assessment:  Report given to PACU RN and Post -op Vital signs reviewed and stable  Post vital signs:  Reviewed and stable  Last Vitals:  Vitals:   10/20/17 1400 10/20/17 1647  BP: (!) 159/102   Pulse: 100 (P) 99  Resp: 15 (P) 10  Temp: 36.8 C (P) 37.1 C  SpO2: 179% (P) 15%    Complications: No apparent anesthesia complications

## 2017-10-20 NOTE — Anesthesia Preprocedure Evaluation (Addendum)
Anesthesia Evaluation  Patient identified by MRN, date of birth, ID band Patient awake    Reviewed: Allergy & Precautions, H&P , NPO status , Patient's Chart, lab work & pertinent test results  Airway Mallampati: II  TM Distance: >3 FB Neck ROM: Full    Dental no notable dental hx. (+) Teeth Intact, Dental Advisory Given   Pulmonary neg pulmonary ROS,    Pulmonary exam normal breath sounds clear to auscultation       Cardiovascular negative cardio ROS   Rhythm:Regular Rate:Normal     Neuro/Psych  Headaches, Anxiety Depression TIA   GI/Hepatic Neg liver ROS, GERD  Medicated and Controlled,Cholangiocarcinoma   Endo/Other  Hyperthyroidism   Renal/GU negative Renal ROS  negative genitourinary   Musculoskeletal   Abdominal   Peds  Hematology negative hematology ROS (+) anemia ,   Anesthesia Other Findings   Reproductive/Obstetrics negative OB ROS                            Anesthesia Physical Anesthesia Plan  ASA: II  Anesthesia Plan: General   Post-op Pain Management:    Induction: Intravenous  PONV Risk Score and Plan: 3 and Ondansetron, Dexamethasone and Midazolam  Airway Management Planned: Oral ETT  Additional Equipment:   Intra-op Plan:   Post-operative Plan: Extubation in OR  Informed Consent: I have reviewed the patients History and Physical, chart, labs and discussed the procedure including the risks, benefits and alternatives for the proposed anesthesia with the patient or authorized representative who has indicated his/her understanding and acceptance.   Dental advisory given  Plan Discussed with: CRNA  Anesthesia Plan Comments:         Anesthesia Quick Evaluation

## 2017-10-20 NOTE — Anesthesia Postprocedure Evaluation (Signed)
Anesthesia Post Note  Patient: Nancy [name]  Procedure(s) Performed: ENDOSCOPIC RETROGRADE CHOLANGIOPANCREATOGRAPHY (ERCP) WITH PROPOFOL (N/A )     Patient location during evaluation: PACU Anesthesia Type: General Level of consciousness: awake and alert Pain management: pain level controlled Vital Signs Assessment: post-procedure vital signs reviewed and stable Respiratory status: spontaneous breathing, nonlabored ventilation, respiratory function stable and patient connected to nasal cannula oxygen Cardiovascular status: blood pressure returned to baseline and stable Postop Assessment: no apparent nausea or vomiting Anesthetic complications: no    Last Vitals:  Vitals:   10/20/17 1700 10/20/17 1715  BP: (!) 171/113 (!) 162/98  Pulse: 95 95  Resp: (!) 8 11  Temp:    SpO2: 100% 100%    Last Pain:  Vitals:   10/20/17 1400  TempSrc: Oral                 Megin Consalvo S

## 2017-10-20 NOTE — Discharge Instructions (Signed)
General Anesthesia, Adult, Care After These instructions provide you with information about caring for yourself after your procedure. Your health care provider may also give you more specific instructions. Your treatment has been planned according to current medical practices, but problems sometimes occur. Call your health care provider if you have any problems or questions after your procedure. What can I expect after the procedure? After the procedure, it is common to have:  Vomiting.  A sore throat.  Mental slowness.  It is common to feel:  Nauseous.  Cold or shivery.  Sleepy.  Tired.  Sore or achy, even in parts of your body where you did not have surgery.  Follow these instructions at home: For at least 24 hours after the procedure:  Do not: ? Participate in activities where you could fall or become injured. ? Drive. ? Use heavy machinery. ? Drink alcohol. ? Take sleeping pills or medicines that cause drowsiness. ? Make important decisions or sign legal documents. ? Take care of children on your own.  Rest. Eating and drinking  If you vomit, drink water, juice, or soup when you can drink without vomiting.  Drink enough fluid to keep your urine clear or pale yellow.  Make sure you have little or no nausea before eating solid foods.  Follow the diet recommended by your health care provider. General instructions  Have a responsible adult stay with you until you are awake and alert.  Return to your normal activities as told by your health care provider. Ask your health care provider what activities are safe for you.  Take over-the-counter and prescription medicines only as told by your health care provider.  If you smoke, do not smoke without supervision.  Keep all follow-up visits as told by your health care provider. This is important. Contact a health care provider if:  You continue to have nausea or vomiting at home, and medicines are not helpful.  You  cannot drink fluids or start eating again.  You cannot urinate after 8-12 hours.  You develop a skin rash.  You have fever.  You have increasing redness at the site of your procedure. Get help right away if:  You have difficulty breathing.  You have chest pain.  You have unexpected bleeding.  You feel that you are having a life-threatening or urgent problem. This information is not intended to replace advice given to you by your health care provider. Make sure you discuss any questions you have with your health care provider. Document Released: 11/22/2000 Document Revised: 01/19/2016 Document Reviewed: 07/31/2015 Elsevier Interactive Patient Education  2018 [place] HAD AN ENDOSCOPIC PROCEDURE TODAY: Refer to the procedure report and other information in the discharge instructions given to you for any specific questions about what was found during the examination. If this information does not answer your questions, please call [place] office at [phone] to clarify.   YOU SHOULD EXPECT: Some feelings of bloating in the abdomen. Passage of more gas than usual. Walking can help get rid of the air that was put into your GI tract during the procedure and reduce the bloating. If you had a lower endoscopy (such as a colonoscopy or flexible sigmoidoscopy) you may notice spotting of blood in your stool or on the toilet paper. Some abdominal soreness may be present for a day or two, also.  DIET: Your first meal following the procedure should be a light meal and then it is ok to progress to your normal diet. A half-sandwich  or bowl of soup is an example of a good first meal. Heavy or fried foods are harder to digest and may make you feel nauseous or bloated. Drink plenty of fluids but you should avoid alcoholic beverages for 24 hours. If you had a esophageal dilation, please see attached instructions for diet.    ACTIVITY: Your care partner should take you home directly after the  procedure. You should plan to take it easy, moving slowly for the rest of the day. You can resume normal activity the day after the procedure however YOU SHOULD NOT DRIVE, use power tools, machinery or perform tasks that involve climbing or major physical exertion for 24 hours (because of the sedation medicines used during the test).   SYMPTOMS TO REPORT IMMEDIATELY: A gastroenterologist can be reached at any hour. Please call [phone]  for any of the following symptoms:  Following upper endoscopy (EGD, EUS, ERCP, esophageal dilation) Vomiting of blood or coffee ground material  New, significant abdominal pain  New, significant chest pain or pain under the shoulder blades  Painful or persistently difficult swallowing  New shortness of breath  Black, tarry-looking or red, bloody stools  FOLLOW UP:  If any biopsies were taken you will be contacted by phone or by letter within the next 1-3 weeks. Call [phone]  if you have not heard about the biopsies in 3 weeks.  Please also call with any specific questions about appointments or follow up tests.

## 2017-10-20 NOTE — Anesthesia Procedure Notes (Signed)
Procedure Name: Intubation Date/Time: 10/20/2017 2:53 PM Performed by: Anne Fu, CRNA Pre-anesthesia Checklist: Patient identified, Emergency Drugs available, Suction available, Patient being monitored and Timeout performed Patient Re-evaluated:Patient Re-evaluated prior to induction Oxygen Delivery Method: Circle system utilized Preoxygenation: Pre-oxygenation with 100% oxygen Induction Type: IV induction Ventilation: Mask ventilation without difficulty Laryngoscope Size: Mac and 4 Grade View: Grade I Tube type: Oral Tube size: 7.5 mm Number of attempts: 1 Airway Equipment and Method: Stylet Placement Confirmation: ETT inserted through vocal cords under direct vision,  positive ETCO2 and breath sounds checked- equal and bilateral Secured at: 19 cm Tube secured with: Tape Dental Injury: Teeth and Oropharynx as per pre-operative assessment

## 2017-10-20 NOTE — Op Note (Signed)
[facility] Patient Name: Nancy [name] Procedure Date: 10/20/2017 MRN: [id number] Attending MD: [name] , MD Date of Birth: [birth year] CSN: [id number] Age: 52 Admit Type: Outpatient Procedure:                ERCP Indications:              known metastatic cholangiocarcinoma (liver, lung,                            pelvis), now with biliary obstruction likely from                            portal adenopathy; bilateral intrahepatic dilation,                            dilated CHD, dilated cystic duct and dilated GB. Providers:                [name], MD, Cleda Daub, RN, Laurena Spies, Technician Referring MD:             Truitt Merle, MD Medicines:                Indomethacin 100 mg PR, General Anesthesia Complications:            No immediate complications. Estimated blood loss:                            None Estimated Blood Loss:     Estimated blood loss: none. Procedure:                Pre-Anesthesia Assessment:                           - Prior to the procedure, a History and Physical                            was performed, and patient medications and                            allergies were reviewed. The patient's tolerance of                            previous anesthesia was also reviewed. The risks                            and benefits of the procedure and the sedation                            options and risks were discussed with the patient.                            All questions were answered, and informed consent  was obtained. Prior Anticoagulants: The patient has                            taken no previous anticoagulant or antiplatelet                            agents. ASA Grade Assessment: II - A patient with                            mild systemic disease. After reviewing the risks                            and benefits, the patient was deemed in            satisfactory condition to undergo the procedure.                           After obtaining informed consent, the scope was                            passed under direct vision. Throughout the                            procedure, the patient's blood pressure, pulse, and                            oxygen saturations were monitored continuously. The                            Duodenoscope was introduced through the mouth, and                            used to inject contrast into and used to inject                            contrast into the bile duct. The ERCP was not                            successful. The patient tolerated the procedure                            well. Scope In: Scope Out: Findings:      The scout film was normal. The esophagus was successfully intubated       under direct vision. The scope was advanced to a normal major papilla in       the descending duodenum without detailed examination of the pharynx,       larynx and associated structures, and upper GI tract. A 44 Autotome over       a .035 hydrawire was used to cannulate the bile duct and contrast was       injected. An identicle wire was temporarily placed in the main       pancreatic duct to facilitate biliary cannulation. To assure biliary       access, a biliary sphincterotomy was performed. Cholangiogram showed the  most distal 2cm of the bile duct was normal, non-dilated. Proximal to       that was a mid bile duct stricture. The cystic duct partially filled       with dye and the CHD filled with dye leading to arborized intrahepatic       ducts. There was significant overlap of cystic duct, CHD images on       flurouscopy. I asked IR to view my cholangiogram during the case and for       a brief time felt fairly certain that the wire was located in the CHD       and so I attempted metal stent placement. The stent advanced through the       distal end of the stricture quite smoothly. The  stent would not advance       through the more proximal end of the stricture however. I dilated what I       felt may be the proximal end of the stricture with hurricane 4cm long       38mm biliary dilator and then reattempted metal stent placement. I was       never certain enough of the location of the proximal end of the stent       and elected to abort the case (I believe it was in the cystic duct, not       the CHD). The main pancreatic duct was cannulated with a wire and       briefly injected with dye.      Reviewing the films (CT, cholangiogram) after the case again with       radiology, I believe that contrast filled the common hepatic duct but       the wire would only advance into the (overlapping) cystic duct. Impression:               - Mid bile duct stricture which I was unable to                            stent. Moderate Sedation:      N/A- Per Anesthesia Care Recommendation:           - Discharge patient to home (ambulatory).                           - [facility] will expedite referral to [facility]                            Advanced endoscopy facility to consider repeat                            ERCP, stenting. Procedure Code(s):        --- Professional ---                           [phone], Endoscopic retrograde                            cholangiopancreatography (ERCP); with                            trans-endoscopic balloon dilation of  biliary/pancreatic duct(s) or of ampulla                            (sphincteroplasty), including sphincterotomy, when                            performed, each duct Diagnosis Code(s):        --- Professional ---                           R17, Unspecified jaundice CPT copyright 2016 [employer]. All rights reserved. The codes documented in this report are preliminary and upon coder review may  be revised to meet current compliance requirements. [name], MD 10/20/2017 5:02:56 PM This  report has been signed electronically. Number of Addenda: 0

## 2017-10-21 ENCOUNTER — Encounter (HOSPITAL_COMMUNITY): Payer: Self-pay | Admitting: Gastroenterology

## 2017-10-21 ENCOUNTER — Telehealth: Payer: Self-pay | Admitting: Gastroenterology

## 2017-10-21 NOTE — Telephone Encounter (Signed)
The pt has been advised and aware that she should be expecting a phone call on Monday with appt.  She will call if she develops fever, chills, or abd pain.

## 2017-10-21 NOTE — Telephone Encounter (Signed)
Should be OK. Please let Ms. Steidle know that plan. She should call here if she has fevers, chills or significant abd pains prior to then.

## 2017-10-21 NOTE — Telephone Encounter (Signed)
   Her ERCP yesterday was not successful.  She lives in [place] and I would like to refer her to at [facility] to consider ERCP, metal stent placement hopefully relatively early next week.  Her bilirubin was 6 this week.  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  Please send him a copy of her recent CT scan and also my ERCP report from yesterday.  Attention Dr. Delrae Alfred.

## 2017-10-21 NOTE — Telephone Encounter (Signed)
Dr Delrae Alfred is out of the office until 930 pm tonight and he will look at the schedule and call the pt on Monday for an appt on next Friday.  Dr Ardis Hughs is this soon enough

## 2017-10-21 NOTE — Telephone Encounter (Signed)
[place], Dr Marjean Donna, left message on her voice mail to return call

## 2017-10-25 ENCOUNTER — Telehealth: Payer: Self-pay | Admitting: *Deleted

## 2017-10-25 NOTE — Telephone Encounter (Signed)
Received vm call from pt who sounded upset & states that she is calling to see if Dr Burr Medico can help her.  She reports that she had a stent scheduled last Thursday that didn't go well & was referred to a surgeon at [facility] but hasn't heard from anyone yet.  She states she is very concerned & needs help.  \ Her husband also called in regard to referral & states that they have spoken with someone at [facility] requested call back.  Reviewed notes & Dr Ardis Hughs made referral to Dr Delrae Alfred & someone from [facility] should call them.  Left message with husband to contact Dr Ardis Hughs office regarding referral & talked with [facility] & informed to also to call Dr Eugenia Pancoast office since he made referral.  She wanted to know if she has thrush, should she call Dr Burr Medico.  She reports a white tongue but no soreness.  She states her throat is a little sore but she thinks it is from the procedure & it is getting better "recovering".  Suggested she try some yogurt to see if this helps but will discuss with Dr Burr Medico.

## 2017-10-26 ENCOUNTER — Telehealth: Payer: Self-pay | Admitting: Gastroenterology

## 2017-10-26 NOTE — Telephone Encounter (Signed)
Per [facility], patient is scheduled for ERCP today (10/26/17) at 1230 pm.

## 2017-10-26 NOTE — Telephone Encounter (Signed)
Patty, Can you check on her status for ERCP this week at [facility].  Not sure that it has been scheduled yet.  If they cannot help her with ERCP by this Friday, please pass on any later appointments and then instead begin referral to IR for PTC (this week).  I d/w this with Dr. Burr Medico and she agrees.

## 2017-10-31 ENCOUNTER — Inpatient Hospital Stay: Payer: Federal, State, Local not specified - PPO

## 2017-10-31 ENCOUNTER — Telehealth: Payer: Self-pay | Admitting: Nurse Practitioner

## 2017-10-31 ENCOUNTER — Other Ambulatory Visit: Payer: Federal, State, Local not specified - PPO

## 2017-10-31 ENCOUNTER — Ambulatory Visit: Payer: Federal, State, Local not specified - PPO

## 2017-10-31 ENCOUNTER — Inpatient Hospital Stay (HOSPITAL_BASED_OUTPATIENT_CLINIC_OR_DEPARTMENT_OTHER): Payer: Federal, State, Local not specified - PPO | Admitting: Nurse Practitioner

## 2017-10-31 ENCOUNTER — Telehealth: Payer: Self-pay | Admitting: *Deleted

## 2017-10-31 ENCOUNTER — Other Ambulatory Visit: Payer: Self-pay | Admitting: Nurse Practitioner

## 2017-10-31 ENCOUNTER — Ambulatory Visit: Payer: Federal, State, Local not specified - PPO | Admitting: Nurse Practitioner

## 2017-10-31 ENCOUNTER — Encounter: Payer: Self-pay | Admitting: Nurse Practitioner

## 2017-10-31 ENCOUNTER — Inpatient Hospital Stay: Payer: Federal, State, Local not specified - PPO | Attending: Hematology | Admitting: Nutrition

## 2017-10-31 VITALS — BP 134/87 | HR 87

## 2017-10-31 VITALS — BP 129/85 | HR 106 | Temp 98.9°F | Resp 17 | Ht 66.0 in

## 2017-10-31 DIAGNOSIS — C786 Secondary malignant neoplasm of retroperitoneum and peritoneum: Secondary | ICD-10-CM | POA: Diagnosis not present

## 2017-10-31 DIAGNOSIS — C7800 Secondary malignant neoplasm of unspecified lung: Principal | ICD-10-CM

## 2017-10-31 DIAGNOSIS — D649 Anemia, unspecified: Secondary | ICD-10-CM | POA: Insufficient documentation

## 2017-10-31 DIAGNOSIS — R609 Edema, unspecified: Secondary | ICD-10-CM

## 2017-10-31 DIAGNOSIS — R102 Pelvic and perineal pain: Secondary | ICD-10-CM | POA: Insufficient documentation

## 2017-10-31 DIAGNOSIS — C221 Intrahepatic bile duct carcinoma: Secondary | ICD-10-CM

## 2017-10-31 DIAGNOSIS — C7801 Secondary malignant neoplasm of right lung: Principal | ICD-10-CM

## 2017-10-31 DIAGNOSIS — I82402 Acute embolism and thrombosis of unspecified deep veins of left lower extremity: Secondary | ICD-10-CM | POA: Diagnosis not present

## 2017-10-31 DIAGNOSIS — F419 Anxiety disorder, unspecified: Secondary | ICD-10-CM | POA: Diagnosis not present

## 2017-10-31 DIAGNOSIS — C787 Secondary malignant neoplasm of liver and intrahepatic bile duct: Principal | ICD-10-CM

## 2017-10-31 DIAGNOSIS — K5900 Constipation, unspecified: Secondary | ICD-10-CM | POA: Diagnosis not present

## 2017-10-31 DIAGNOSIS — G893 Neoplasm related pain (acute) (chronic): Secondary | ICD-10-CM

## 2017-10-31 DIAGNOSIS — Z5111 Encounter for antineoplastic chemotherapy: Secondary | ICD-10-CM | POA: Diagnosis not present

## 2017-10-31 DIAGNOSIS — B379 Candidiasis, unspecified: Secondary | ICD-10-CM | POA: Diagnosis not present

## 2017-10-31 DIAGNOSIS — G620 Drug-induced polyneuropathy: Secondary | ICD-10-CM | POA: Diagnosis not present

## 2017-10-31 DIAGNOSIS — Z7189 Other specified counseling: Secondary | ICD-10-CM | POA: Insufficient documentation

## 2017-10-31 DIAGNOSIS — R110 Nausea: Secondary | ICD-10-CM | POA: Diagnosis not present

## 2017-10-31 DIAGNOSIS — B370 Candidal stomatitis: Secondary | ICD-10-CM

## 2017-10-31 DIAGNOSIS — E860 Dehydration: Secondary | ICD-10-CM

## 2017-10-31 LAB — CMP (CANCER CENTER ONLY)
ALT: 72 U/L — ABNORMAL HIGH (ref 0–55)
AST: 47 U/L — ABNORMAL HIGH (ref 5–34)
Albumin: 3.7 g/dL (ref 3.5–5.0)
Alkaline Phosphatase: 96 U/L (ref 40–150)
Anion gap: 16 — ABNORMAL HIGH (ref 3–11)
BUN: 21 mg/dL (ref 7–26)
CO2: 22 mmol/L (ref 22–29)
Calcium: 9.6 mg/dL (ref 8.4–10.4)
Chloride: 95 mmol/L — ABNORMAL LOW (ref 98–109)
Creatinine: 1.24 mg/dL — ABNORMAL HIGH (ref 0.60–1.10)
GFR, Est AFR Am: 58 mL/min — ABNORMAL LOW (ref >60)
GFR, Estimated: 50 mL/min — ABNORMAL LOW (ref >60)
Glucose, Bld: 111 mg/dL (ref 70–140)
Potassium: 3.5 mmol/L (ref 3.5–5.1)
Sodium: 133 mmol/L — ABNORMAL LOW (ref 136–145)
Total Bilirubin: 4.3 mg/dL (ref 0.2–1.2)
Total Protein: 6.9 g/dL (ref 6.4–8.3)

## 2017-10-31 LAB — CBC WITH DIFFERENTIAL (CANCER CENTER ONLY)
Basophils Absolute: 0 10*3/uL (ref 0.0–0.1)
Basophils Relative: 0 %
Eosinophils Absolute: 0 10*3/uL (ref 0.0–0.5)
Eosinophils Relative: 0 %
HCT: 26.4 % — ABNORMAL LOW (ref 34.8–46.6)
Hemoglobin: 8.6 g/dL — ABNORMAL LOW (ref 11.6–15.9)
Lymphocytes Relative: 10 %
Lymphs Abs: 0.5 10*3/uL — ABNORMAL LOW (ref 0.9–3.3)
MCH: 28.6 pg (ref 25.1–34.0)
MCHC: 32.6 g/dL (ref 31.5–36.0)
MCV: 87.7 fL (ref 79.5–101.0)
Monocytes Absolute: 0.3 10*3/uL (ref 0.1–0.9)
Monocytes Relative: 5 %
Neutro Abs: 4.7 10*3/uL (ref 1.5–6.5)
Neutrophils Relative %: 85 %
Platelet Count: 245 10*3/uL (ref 145–400)
RBC: 3.01 MIL/uL — ABNORMAL LOW (ref 3.70–5.45)
RDW: 16.7 % — ABNORMAL HIGH (ref 11.2–14.5)
WBC Count: 5.5 10*3/uL (ref 3.9–10.3)

## 2017-10-31 MED ORDER — FLUCONAZOLE 100 MG PO TABS: 100.0000 mg | ORAL_TABLET | Freq: Every day | ORAL | 0 refills | Status: DC

## 2017-10-31 MED ORDER — DRONABINOL 5 MG PO CAPS: 5.0000 mg | ORAL_CAPSULE | Freq: Two times a day (BID) | ORAL | 1 refills | Status: AC

## 2017-10-31 MED ORDER — ONDANSETRON HCL 8 MG PO TABS: 8.0000 mg | ORAL_TABLET | Freq: Three times a day (TID) | ORAL | 3 refills | Status: AC | PRN

## 2017-10-31 MED ORDER — HEPARIN SOD (PORK) LOCK FLUSH 100 UNIT/ML IV SOLN
500.0000 [IU] | Freq: Once | INTRAVENOUS | Status: AC | PRN
  Administered 2017-10-31: 500 [IU]
  Filled 2017-10-31: qty 5

## 2017-10-31 MED ORDER — SODIUM CHLORIDE 0.9 % IV SOLN
Freq: Once | INTRAVENOUS | Status: AC
  Administered 2017-10-31: 12:00:00 via INTRAVENOUS

## 2017-10-31 MED ORDER — SODIUM CHLORIDE 0.9% FLUSH
10.0000 mL | INTRAVENOUS | Status: DC | PRN
  Administered 2017-10-31: 10 mL
  Filled 2017-10-31: qty 10

## 2017-10-31 NOTE — Telephone Encounter (Signed)
Scheduled appt per 3/4 os - Gave patient AVS and calender  per los.

## 2017-10-31 NOTE — Progress Notes (Signed)
Nutrition follow-up completed with patient and family secondary to cholioangiocarcinoma with metastasis to the lung. Last weight was documented as 108 pounds on February 21. Patient is concerned she has thrush because food tastes terrible and her tongue is covered with white material. She is getting constipation. Patient reports improved nausea and vomiting secondary to scopolamine patch. Patient consumes 2 Carnation breakfast essentials "on a good day ". Patient reports Megace seems to help appetite. Labs were reviewed: Sodium 133, creatinine 1.24, total bilirubin 4.3.  Nutrition diagnosis: Unintended weight loss continues.  Intervention: Reviewed ideas with  patient and family to increase calorie and protein intake. Provided samples of additional oral nutrition supplements. Provided support and encouragement for patient to continue eating the best she can. Questions were answered and teach back method used.  Contact information was given.  Monitoring evaluation goals: Patient will increase calories and protein to minimize weight loss.  Next visit: To be scheduled as needed.  **Disclaimer: This note was dictated with voice recognition software. Similar sounding words can inadvertently be transcribed and this note may contain transcription errors which may not have been corrected upon publication of note.**

## 2017-10-31 NOTE — Telephone Encounter (Signed)
Received call from lab re: elevated bilirubin 4.3. Cira Rue, NP aware. Seeing pt in office.

## 2017-10-31 NOTE — Progress Notes (Addendum)
[place]  [phone] [phone]  Clinic Follow up Note   Patient Care Team: Carollee Herter, Alferd Apa, DO as PCP - General Pyrtle, Lajuan Lines, MD as Consulting Physician (Gastroenterology) 10/31/2017  SUMMARY OF ONCOLOGIC HISTORY: Oncology History   Metastatic cholangiocarcinoma to lung [facility])   Staging form: Perihilar Bile Ducts, AJCC 7th Edition   - Clinical stage from 07/07/2016: Stage IVB (T2b, N1, M1) - Signed by Truitt Merle, MD on 07/28/2016      Metastatic cholangiocarcinoma to lung ([place])   05/22/2014 Procedure    Routine screening colonoscopy showed a sessile polyp measuring 4 mm in the sigmoid colon, removed, otherwise negative.      06/29/2016 Imaging    CT chest, abdomen and pelvis showed 2 indeterminant nodule in left and the right lung, 4-36m, indeterminate a hypovascular liver lesions largest in the caudate lobe measuring 2.6 cm, mild abdominal lymphadenopathy in the gastrohepatic ligament and portocaval space.      07/02/2016 Imaging    Abdominal MRI with and without contrast showed 2 lobular lesions in the caudate lobe, and is regular lymph nodes in the gastrohepatic ligament which is adjacent to the liver lesion, suspicious for malignancy. 2 small lesions in the right hepatic lobe are indeterminate, but concerning for metastasis.      07/07/2016 Procedure    EGD was negative, EUS biopsy of the liver and adjacent lymph node      07/07/2016 Initial Diagnosis    Metastatic cholangiocarcinoma to lung ([place]      07/07/2016 Initial Biopsy    Fine-needle aspiration of the liver lesion in caudate lobe and adjacent lymph nodes both showed metastatic adenocarcinoma.      08/11/2016 - 04/25/2017 Chemotherapy    Cisplatin 25 mg/m, gemcitabine 1000 mg/m, on day one and 8, every 21 days   Cisplatin and gemcitabine on Day 1, 8 every 21 days, with neulasta on day 9, Gemcitabine dosed reduced to 800 mg/m due to cytopenias, increased to '900mg'$ /m2 on  12/06/2016. Changes to gemcitabine maintenance therapy on day 1 and 8 every 21 days, from 04/18/2017, gemcitabine was reduced dose to 400 mg/m on 04/25/17       10/18/2016 Imaging    CT CAP  IMPRESSION: 1. Again noted are multifocal liver metastasis. Overall there has been no significant interval change in overall volume of liver metastases. 2. Similar appearance of upper abdominal adenopathy 3. Slight decrease in size of small pulmonary metastases.      01/25/2017 Imaging    CT CAP W Contrast 01/25/17 IMPRESSION: 1. Response to therapy. 2. Similar to minimal decrease in pulmonary nodules/metastasis. 3. Improvement in hepatic disease burden. 4. Decrease in abdominal adenopathy. 5. Significantly age advanced atherosclerosis, including within the coronary arteries. 6. Uterine fibroids. 7. Suspect a complex cyst in the left ovary. Recommend attention on follow-up.       04/13/2017 Imaging    CT CAP W Contrast IMPRESSION: 1. The dominant caudate lobe mass is stable in size and appearance, as is the indistinct adenopathy in the porta hepatis adjacent to the caudate lobe, and in the retroperitoneum. Several subtle additional liver lesions are slightly less conspicuous on today' s exam. The pulmonary nodules are stable, and mild omental nodularity in the left lower quadrant is very slightly worsened compared to prior. Overall the burden of malignancy appears similar to prior, and some of the findings may represent effectively treated metastatic lesions. 2. Complex left adnexal mass, 6.7 by 4.1 cm, enlarging. Pelvic sonography recommended for  further characterization. 3. Other imaging findings of potential clinical significance: Aortic Atherosclerosis (ICD10-I70.0). Coronary atherosclerosis. Prominent stool throughout the colon favors constipation. Fibroid uterus.      05/16/2017 - 06/27/2017 Chemotherapy    Second-line chemo FOLFOX every 2 weeks with neulasta after pump DC. She will  started with low dose FOLFOX on 05/16/17, due to her neutropenia. Held oxaliplatin with cycle two due to poor toleration. Discontinued after cycle 3 (06/27/17) due to disease progression.       07/20/2017 Imaging    IMPRESSION: CT CHEST  1. Interval development of 2 small new pulmonary nodules concerning for progressive, or new metastatic disease. Recommend continued attention on follow-up imaging. 2. The previously noted right upper and left upper lobe pulmonary nodules remain stable in size consistent with treated metastatic disease. 3. Stable position of right IJ approach power injectable port catheter. 4. Coronary artery calcifications. CT ABD/PELVIS  1. Marked interval enlargement of a complex, enhancing, cystic and solid left ovarian mass which now fills the posterior aspect of the pelvis exerting mass effect on the adjacent rectosigmoid colon and bladder. This likely accounts for the patient's lower abdominal pain and sensation of constipation (there is no significant stool burden within the rectum or sigmoid colon). These findings are highly suspicious for a primary ovarian cystic or mucinous adenocarcinoma. 2. Enlarging omental implants in the left lower quadrant highly concerning for metastatic ovarian carcinoma. 3. New/enlarging subcapsular hepatic lesion may represent progressive cholangio or ovarian carcinoma metastases. 4. Enlarging caudate lobe lesion and aortocaval/portacaval confluent necrotic adenopathy concerning for progressive metastatic cholangiocarcinoma. 5. The segment 6 hepatic lesion remains stable or may be smaller on today's examination. These results will be called to the ordering clinician or representative by the Radiologist Assistant, and communication documented in the PACS or zVision Dashboard.        07/30/2017 Imaging     CT AP W Contrast 07/30/17 IMPRESSION: 1. No substantial interval change in exam. 2. Complex cystic and solid mass  filling the cul-de-sac and left adnexal space is similar to prior. There is abnormal soft tissue in the anterior left lower quadrant concerning for metastatic disease. 3. Stable appearance of the caudate lesion in this patient with known cholangiocarcinoma. There is confluent necrotic lymphadenopathy in the porta hepatis, similar to prior with substantial mass-effect on the left renal vein as it enters the IVC. 4. Fibroid change in the uterus. 5. Trace intraperitoneal free fluid.      08/05/2017 Pathology Results    Diagnosis 08/05/17  Peritoneum, biopsy, left lower abdomen METASTATIC ADENOCARCINOMA, CONSISTENT WITH CHOLANGIOCARCINOMA. Microscopic Comment      08/08/2017 Imaging    CT AP WO Contrast 08/08/17 IMPRESSION: 1. No substantial interval change in exam although assessment today limited by lack of intravenous contrast material. 2. Stomach is distended with fluid, similar to prior. 3. Patient's known caudate liver lesion not well demonstrated on today's exam. 4. Similar appearance large complex cystic and solid mass in the cul-de-sac and left adnexal space. 5. Similar appearance abnormal soft tissue in the anterior left lower quadrant. 6. Fibroid change in the uterus.      08/16/2017 -  Chemotherapy    CAPOX: Xeloda '1500mg'$  BID for 1 week on and 1 week off with low dose oxaliplatin starting 08/16/17. Stopped Xeloda 09/19/17 and start 5-FU pump.         10/07/2017 Imaging    CT CAP W CONTRAST IMPRESSION: 1. Interval slight enlargement of the central liver lesions/cholangiocarcinoma with progressive intrahepatic biliary dilatation. 2.  New pulmonary metastatic lesions and several new hepatic metastatic lesions. 3. Slight interval enlargement of the periportal adenopathy surrounding the IVC. 4. Significant interval enlargement of the large cystic and solid pelvic mass now extending up to the level of the umbilicus 5. Slight enlargement of the peritoneal implant in the  left pelvis.      10/20/2017 Procedure    ERCP per Dr. Ardis Hughs: Impression: - Mid bile duct stricture which I was unable to stent.      10/26/2017 Procedure    ERCP per Dr. Lynita Lombard at [facility] endoscopy: Placement of a covered [id number] metal stent through a common hepatic duct stricture. This was technically difficult as it was a very tight, unusually firm stricture which may be extrinsic, related to nodal metastases or direct tumor infiltration from known cholangiocarcinoma. Following stent deployment, the waist at the stricture remained very narrow. An attempt to advance a dilating balloon both before and after stent placement was not successful.   Patient was observed overnight for pain management and discharged home next day.      CURRENT THERAPY: Third line CAPOX: Xeloda '1500mg'$  BID for 1 week on and 1 week off with low dose oxaliplatin every 2 weeks starting 08/16/17. Stopped Xeloda 09/19/17 and start 5-FU pump. Last treatment 09/19/17.    INTERVAL HISTORY: Ms. SWhittinghamreturns for follow-up as scheduled.  She last received FOLFOX chemo on 09/19/2017.  [facility] after ERCP on 10/24/17 was unsuccessful.  Her fatigue fluctuates but overall energy level is very low, has spent most of the last weekend in bed or on the couch resting.  Her appetite is present but p.o. intake is compromised due to thrush and decreased taste. Had some dizziness today which is not new, takes meclizine occasionally. Nausea is managed well with scopolamine patch, no vomiting. She has dealt with constipation, no BM In 2 days. She uses colace and miralax PRN for constipation. No diarrhea. Her abdominal pain is well controlled on oxycontin BID alone, has not required breakthrough oxycodone in the last week or so. She has small amount of lower extremity edema, present before stent procedure and improving since then. Denies  calf pain. No cough, dyspnea, chest pain, or palpitations.   REVIEW OF SYSTEMS:   Constitutional: Denies fevers, chills or abnormal weight loss (+) fatigue (+) decreased po intake (+) decreased taste  Eyes: Denies blurriness of vision Ears, nose, mouth, throat, and face: Denies mucositis or sore throat (+) white coating to tongue  Respiratory: Denies cough, dyspnea or wheezes Cardiovascular: Denies palpitation, chest discomfort (+) lower extremity swelling Gastrointestinal:  Denies nausea, vomiting, diarrhea, heartburn or change in bowel habits (+) constipation, last BM 2 days ago Skin: Denies abnormal skin rashes Lymphatics: Denies new lymphadenopathy or easy bruising Neurological: (+) neuropathy, feet; stable (+) generalized weakness (+) occasional dizziness, takes meclizine PRN Behavioral/Psych: Mood is stable, no new changes  All other systems were reviewed with the patient and are negative.  MEDICAL HISTORY:  Past Medical History:  Diagnosis Date  . Allergy    SEASONAL  . Anemia    [employer]OL  . Anxiety   . Atopic eczema   . cholangio ca dx'd 07/12/16  . Depression   . Family history of adverse reaction to anesthesia    Mother has allergic reactions stops breathing  . Fibrocystic breast disease   . GERD (gastroesophageal reflux disease)   . Hx of migraines  seasonal   . Personal history of chemotherapy   . Pneumonia    2012  . TIA (transient ischemic attack)    Questionable migraine    SURGICAL HISTORY: Past Surgical History:  Procedure Laterality Date  . ENDOSCOPIC RETROGRADE CHOLANGIOPANCREATOGRAPHY (ERCP) WITH PROPOFOL N/A 10/20/2017   Procedure: ENDOSCOPIC RETROGRADE CHOLANGIOPANCREATOGRAPHY (ERCP) WITH PROPOFOL;  Surgeon: [name], MD;  Location: [facility] ENDOSCOPY;  Service: Endoscopy;  Laterality: N/A;  . EUS N/A 07/07/2016   Procedure: UPPER ENDOSCOPIC ULTRASOUND (EUS) RADIAL;  Surgeon: [name], MD;  Location: [facility] ENDOSCOPY;  Service: Endoscopy;   Laterality: N/A;  . IR GENERIC HISTORICAL  08/03/2016   IR US GUIDE VASC ACCESS RIGHT 08/03/2016 Corrie Mckusick, DO [facility]-INTER[facility] RAD  . IR GENERIC HISTORICAL  08/03/2016   IR FLUORO GUIDE PORT INSERTION RIGHT 08/03/2016 Corrie Mckusick, DO [facility]  . MYOMECTOMY  2002    I have reviewed the social history and family history with the patient and they are unchanged from previous note.  ALLERGIES:  is allergic to penicillins; contrast media [iodinated diagnostic agents]; and lipitor [atorvastatin].  MEDICATIONS:  Current Outpatient Medications  Medication Sig Dispense Refill  . ALPRAZolam (XANAX) 0.5 MG tablet Take 1 tablet (0.5 mg total) by mouth 3 (three) times daily as needed for anxiety. 30 tablet 0  . Cholecalciferol (VITAMIN D3) 5000 UNITS CAPS Take 5,000 Units by mouth daily.     . COLOSTRUM PO Take 1.25 g by mouth 2 (two) times daily.     Marland Kitchen dicyclomine (BENTYL) 20 MG tablet Take 20 mg by mouth 3 (three) times daily as needed for spasms.     Marland Kitchen lidocaine-prilocaine (EMLA) cream Apply 1 application topically as needed. Apply to portacath 1 1/2 hours - 2 hours prior to procedures as needed. 30 g 1  . LORazepam (ATIVAN) 0.5 MG tablet Place under tongue and dissolve every 8 hours as needed for nausea. Do not take Xanax while taking this medication. 30 tablet 0  . megestrol (MEGACE) 400 MG/10ML suspension Take 10 mLs (400 mg total) by mouth daily. 240 mL 0  . Menaquinone-7 (VITAMIN K2) 40 MCG TABS Take 40 mg by mouth daily.     Marland Kitchen omeprazole (PRILOSEC) 40 MG capsule Take 1 capsule (40 mg total) by mouth 2 (two) times daily before a meal. 60 capsule 1  . ondansetron (ZOFRAN) 8 MG tablet Take 1 tablet (8 mg total) by mouth every 8 (eight) hours as needed for nausea or vomiting. 30 tablet 3  . oxyCODONE (OXYCONTIN) 60 MG 12 hr tablet Take 60 mg by mouth every 12 (twelve) hours. 60 each 0  . polyethylene glycol (MIRALAX / GLYCOLAX) packet Take 17 g by mouth 2 (two) times daily. Hold if you develop  diarrhea (Patient taking differently: Take 17 g by mouth daily as needed for mild constipation. Hold if you develop diarrhea) 100 each 0  . scopolamine (TRANSDERM-SCOP) 1 MG/3DAYS Place 1 patch (1.5 mg total) onto the skin every 3 (three) days. (Patient taking differently: Place 1 patch onto the skin See admin instructions. Every 72 hours as needed) 10 patch 2  . senna (SENOKOT) 8.6 MG TABS tablet Take 2 tablets (17.2 mg total) by mouth 2 (two) times daily. (Patient taking differently: Take 2 tablets by mouth 2 (two) times daily as needed for mild constipation. ) 120 each 0  . cetirizine (ZYRTEC) 10 MG tablet Take 10 mg by mouth daily as needed for allergies. For allergies     . fluconazole (DIFLUCAN)  100 MG tablet Take 1 tablet (100 mg total) by mouth daily. 7 tablet 0  . mometasone (ELOCON) 0.1 % lotion Apply topically daily. (Patient taking differently: Apply 1 application topically daily as needed. ) 60 mL 0  . Oxycodone HCl 10 MG TABS Take 1 tablet (10 mg total) by mouth every 3 (three) hours as needed. 100 tablet 0  . prenatal vitamin w/FE, FA (PRENATAL 1 + 1) 27-1 MG TABS tablet Take 1 tablet by mouth daily at 12 noon.    . promethazine (PHENERGAN) 25 MG tablet Take 1 tablet (25 mg total) by mouth every 6 (six) hours as needed for nausea or vomiting. 40 tablet 0  . vitamin C (ASCORBIC ACID) 500 MG tablet Take 500 mg by mouth daily.     No current facility-administered medications for this visit.    Facility-Administered Medications Ordered in Other Visits  Medication Dose Route Frequency Provider Last Rate Last Dose  . alteplase (CATHFLO ACTIVASE) injection 2 mg  2 mg Intracatheter Once PRN Truitt Merle, MD      . heparin lock flush 100 unit/mL  250 Units Intracatheter Once PRN Truitt Merle, MD      . ondansetron [facility]) injection 8 mg  8 mg Intravenous Once Truitt Merle, MD      . sodium chloride flush (NS) 0.9 % injection 10 mL  10 mL Intracatheter PRN Truitt Merle, MD   10 mL at 09/09/16 1645  .  sodium chloride flush (NS) 0.9 % injection 10 mL  10 mL Intravenous PRN Truitt Merle, MD   10 mL at 02/11/17 1515  . sodium chloride flush (NS) 0.9 % injection 10 mL  10 mL Intracatheter PRN Truitt Merle, MD   10 mL at 10/31/17 1311    PHYSICAL EXAMINATION: ECOG PERFORMANCE STATUS: 3-4   Vitals:   10/31/17 1020  BP: 129/85  Pulse: (!) 106  Resp: 17  Temp: 98.9 F (37.2 C)  SpO2: 100%   Filed Weights    GENERAL: drowsy, no distress. Presents in wheelchair SKIN: skin color, texture, turgor are normal, no rashes or significant lesions EYES: scleral icterus  OROPHARYNX: oral thrush LYMPH:  no palpable cervical, supraclavicular, or axillary lymphadenopathy  LUNGS: clear to auscultation bilaterally with normal breathing effort HEART: regular rate & rhythm and no murmurs (+) trace lower extremity edema, no calf tenderness  ABDOMEN:abdomen soft, non-tender and normal bowel sounds. Exam limited due to patient in wheelchair not able to get up to exam table  Musculoskeletal:no cyanosis of digits and no clubbing  NEURO: alert & oriented x 3 with fluent speech, no focal motor/sensory deficits PAC without erythema   LABORATORY DATA:  I have reviewed the data as listed CBC Latest Ref Rng & Units 10/31/2017 10/17/2017 10/10/2017  WBC 3.9 - 10.3 K/uL 5.5 2.8(L) 3.1(L)  Hemoglobin 11.6 - 15.9 g/dL - - -  Hematocrit 34.8 - 46.6 % 26.4(L) 28.8(L) 31.1(L)  Platelets 145 - 400 K/uL 245 211 240     CMP Latest Ref Rng & Units 10/31/2017 10/17/2017 10/10/2017  Glucose 70 - 140 mg/dL 111 162(H) 85  BUN 7 - 26 mg/dL '21 12 18  '$ Creatinine 0.60 - 1.10 mg/dL 1.24(H) 1.59(H) 0.90  Sodium 136 - 145 mmol/L 133(L) 134(L) 136  Potassium 3.5 - 5.1 mmol/L 3.5 3.7 3.5  Chloride 98 - 109 mmol/L 95(L) 93(L) 96(L)  CO2 22 - 29 mmol/L '22 28 26  '$ Calcium 8.4 - 10.4 mg/dL 9.6 9.9 9.2  Total Protein 6.4 - 8.3 g/dL  6.9 7.1 6.6  Total Bilirubin 0.2 - 1.2 mg/dL 4.3(HH) 6.1(HH) 4.0(HH)  Alkaline Phos 40 - 150 U/L 96 124 61  AST  5 - 34 U/L 47(H) 109(H) 112(H)  ALT 0 - 55 U/L 72(H) 178(H) 204(H)   CA19.9 (0-35U/ml) 07/28/2016: 36 08/18/2016: 51 09/13/2016: 56 10/19/2016: 43 11/09/2016: 44 12/06/2016: 41 01/05/2017: 35 02/02/2017: 37 03/07/17: 43 04/18/17: 66 05/16/17: 111 06/08/17: 119 06/18/17: 107 07/18/17: 161 08/16/17: 272 09/19/17: 669 10/17/17: 1826  CA 125 07/25/17: 100.6   PATHOLOGY    Diagnosis 08/05/17  Peritoneum, biopsy, left lower abdomen METASTATIC ADENOCARCINOMA, CONSISTENT WITH CHOLANGIOCARCINOMA. Microscopic Comment The adenocarcinoma stains positive for ck7, negative for ER, GATA 3, ck20, cdx2, and TTF-1. Pax 8 (patchy stain). The morphology and immunostain pattern support the above diagnosis. The block B is sent to [facility] for further molecular testing.   Diagnosis 07/07/2016 FINE NEEDLE ASPIRATION, ENDOSCOPIC, LIVER (SPECIMEN 2 OF 2 COLLECTED 07/07/16): MALIGNANT CELLS CONSISTENT WITH ADENOCARCINOMA.  ADDITIONAL INFORMATION: Per request, immunohistochemistry was performed. The cells are positive for cytokeratin 7 and negative for cytokeratin 20 and CDX-2. This excludes a colorectal primary. Pancreaticobiliary and upper GI sources remain in the differential. Called to Dr. Burr Medico on 07/28/2016.  Diagnosis 07/07/2016 FINE NEEDLE ASPIRATION, ENDOSCOPIC, GASTRO-HEPATIC LYMPH NODE (SPECIMEN 1 OF 2 COLLECTED 07/07/16): MALIGNANT CELLS CONSISTENT WITH ADENOCARCINOMA, SEE COMMENT. Preliminary Diagnosis Intraoperative Diagnosis: Few atypical glandular clusters - recommend additional material. 4) Adequate ([facility])   RADIOGRAPHIC STUDIES: I have personally reviewed the radiological images as listed and agreed with the findings in the report. No results found.   ASSESSMENT & PLAN: 52 y.o. African-American female, without significant past medical history, presented with abdominal pain  1. Metastatic intrahepatic cholangiocarcinoma, with probable node, lung and peritoneal metastasis,  cT2bN1M1, stage IV -Ms. Birchler appears stable. She completed 3 cycles FOLFOX chemo, last treated on 09/19/17. Underwent biliary stent for obstructive juandice and hypobilirubinemia on 10/26/17. LFTs trending down. CBC stable, her anemia is slightly worse, Hgb 8.6 which may be contributing to her fatigue. Her main issues now are fatigue, recurrent thrush, and poor nutritional status. She met with Dr. Leamon Arnt at [facility] who discussed her case with Dr. Burr Medico; she may qualify for phase I/II trial targeting KRAS mutation at [facility] if her hyperbilirubinemia improves and her overall performance status were to improve. If she cannot tolerate travel, Dr. Burr Medico will consider next line therapy here. She will return for lab, flush, f/u, and IVF weekly x2 to monitor and support her recovery.   2. Obstructive juandice -Likely secondary to obstruction from cholangiocarcinoma and adenopathy; s/p ERCP with stenting 10/26/17. LFTs improving; AST/ALT trending down, tbili 3.4 down from 6.1 prior to stent placement. Will continue to monitor improvement.   3. Abdominal pain, back pain Secondary to cancer progression, however he pain is well controlled currently on oxycontin BID, she has not required breakthrough oxycodone in approximately 1 week. Will monitor closely  4. Nausea, anorexia, weight loss She could not stand for weight today. Her po intake has been compromised dur to recurrent oral thrush. I prescribed fluconazole today. She met with dietitian this morning. Will monitor closely. I have encouraged her to eat more and drink adequately.   5. Goals of care  -Dr. Burr Medico reviewed the incurable nature of her disease. The patient's overall goal is to recover and enroll in a clinical trial likely at [facility] in [place], [place] vs next line chemo here. We discussed her PS, nutrition, and LFTs need to  improve before she can tolerate more chemo. Dr. Burr Medico began the discussion of home care and  supportive care if her recovery does not go well and she is no longer a candidate for anti-cancer therapy. The patient understands. She is full code.   6. Constipation -No BM in 2 days. I encouraged her to take stool softener and miralax today to induce BM. Also recommended she increase water intake, activity as tolerated, and eat small frequent meals to maintain regular BM.   7. Peripheral neuropathy, G1 -secondary to chemotherapy  8. Anxiety  -Stable on xanax 2-3 times per week PRN  PLAN -IVF today -Lab, flush, f/u, 2 hour IVF weekly x2 -Prescribed fluconazole; refilled zofran -Take stool softener and/or miralax to induce BM   All questions were answered. The patient knows to call the clinic with any problems, questions or concerns. No barriers to learning was detected. I spent 30 minutes  counseling the patient face to face. The total time spent in the appointment was 35 minutes and more than 50% was on counseling and review of test results     Alla Feeling, NP 10/31/17   Addendum  I have seen the patient, examined her. I agree with the assessment and and plan and have edited the notes.   Tamy underwent CBD stent placement at [facility] last week, tolerated procedure well.  She is recovering slowly.  She still quite fatigued, with very low appetite and oral thrush.  Her hyperbilirubinemia has improved, but not resolved.  It may take a few more weeks. I have discussed with Dr. Leamon Arnt at [facility]. If her hyperbilirubinemia able to resolve, and her performance status improves, I would recommend her to consider clinical trial at  at [facility], I do not have much other standard chemotherapy options for disease.  Also discussed palliative care alone and hospice if she does not recover well.  She is presently not ready for that. I discussed the above with pt and her husband together. I will f/u her closely with weekly lab, IVF and visit for the next few weeks.   Truitt Merle    10/31/2017

## 2017-10-31 NOTE — Patient Instructions (Signed)
Dehydration, Adult Dehydration is when there is not enough fluid or water in your body. This happens when you lose more fluids than you take in. Dehydration can range from mild to very bad. It should be treated right away to keep it from getting very bad. Symptoms of mild dehydration may include:  Thirst.  Dry lips.  Slightly dry mouth.  Dry, warm skin.  Dizziness. Symptoms of moderate dehydration may include:  Very dry mouth.  Muscle cramps.  Dark pee (urine). Pee may be the color of tea.  Your body making less pee.  Your eyes making fewer tears.  Heartbeat that is uneven or faster than normal (palpitations).  Headache.  Light-headedness, especially when you stand up from sitting.  Fainting (syncope). Symptoms of very bad dehydration may include:  Changes in skin, such as: ? Cold and clammy skin. ? Blotchy (mottled) or pale skin. ? Skin that does not quickly return to normal after being lightly pinched and let go (poor skin turgor).  Changes in body fluids, such as: ? Feeling very thirsty. ? Your eyes making fewer tears. ? Not sweating when body temperature is high, such as in hot weather. ? Your body making very little pee.  Changes in vital signs, such as: ? Weak pulse. ? Pulse that is more than 100 beats a minute when you are sitting still. ? Fast breathing. ? Low blood pressure.  Other changes, such as: ? Sunken eyes. ? Cold hands and feet. ? Confusion. ? Lack of energy (lethargy). ? Trouble waking up from sleep. ? Short-term weight loss. ? Unconsciousness. Follow these instructions at home:  If told by your doctor, drink an ORS: ? Make an ORS by using instructions on the package. ? Start by drinking small amounts, about  cup (120 mL) every 5-10 minutes. ? Slowly drink more until you have had the amount that your doctor said to have.  Drink enough clear fluid to keep your pee clear or pale yellow. If you were told to drink an ORS, finish the ORS  first, then start slowly drinking clear fluids. Drink fluids such as: ? Water. Do not drink only water by itself. Doing that can make the salt (sodium) level in your body get too low (hyponatremia). ? Ice chips. ? Fruit juice that you have added water to (diluted). ? Low-calorie sports drinks.  Avoid: ? Alcohol. ? Drinks that have a lot of sugar. These include high-calorie sports drinks, fruit juice that does not have water added, and soda. ? Caffeine. ? Foods that are greasy or have a lot of fat or sugar.  Take over-the-counter and prescription medicines only as told by your doctor.  Do not take salt tablets. Doing that can make the salt level in your body get too high (hypernatremia).  Eat foods that have minerals (electrolytes). Examples include bananas, oranges, potatoes, tomatoes, and spinach.  Keep all follow-up visits as told by your doctor. This is important. Contact a doctor if:  You have belly (abdominal) pain that: ? Gets worse. ? Stays in one area (localizes).  You have a rash.  You have a stiff neck.  You get angry or annoyed more easily than normal (irritability).  You are more sleepy than normal.  You have a harder time waking up than normal.  You feel: ? Weak. ? Dizzy. ? Very thirsty.  You have peed (urinated) only a small amount of very dark pee during 6-8 hours. Get help right away if:  You have symptoms of   very bad dehydration.  You cannot drink fluids without throwing up (vomiting).  Your symptoms get worse with treatment.  You have a fever.  You have a very bad headache.  You are throwing up or having watery poop (diarrhea) and it: ? Gets worse. ? Does not go away.  You have blood or something green (bile) in your throw-up.  You have blood in your poop (stool). This may cause poop to look black and tarry.  You have not peed in 6-8 hours.  You pass out (faint).  Your heart rate when you are sitting still is more than 100 beats a  minute.  You have trouble breathing. This information is not intended to replace advice given to you by your health care provider. Make sure you discuss any questions you have with your health care provider. Document Released: 06/12/2009 Document Revised: 03/05/2016 Document Reviewed: 10/10/2015 Elsevier Interactive Patient Education  2018 [employer]  

## 2017-11-03 ENCOUNTER — Inpatient Hospital Stay: Payer: Federal, State, Local not specified - PPO

## 2017-11-03 ENCOUNTER — Telehealth: Payer: Self-pay | Admitting: *Deleted

## 2017-11-03 ENCOUNTER — Encounter: Payer: Self-pay | Admitting: Medical

## 2017-11-03 ENCOUNTER — Inpatient Hospital Stay (HOSPITAL_BASED_OUTPATIENT_CLINIC_OR_DEPARTMENT_OTHER): Payer: Federal, State, Local not specified - PPO | Admitting: Medical

## 2017-11-03 ENCOUNTER — Other Ambulatory Visit: Payer: Self-pay | Admitting: *Deleted

## 2017-11-03 VITALS — BP 123/77 | HR 92 | Temp 99.0°F | Resp 17 | Ht 66.0 in | Wt 115.6 lb

## 2017-11-03 DIAGNOSIS — C7801 Secondary malignant neoplasm of right lung: Principal | ICD-10-CM

## 2017-11-03 DIAGNOSIS — C221 Intrahepatic bile duct carcinoma: Secondary | ICD-10-CM

## 2017-11-03 DIAGNOSIS — R102 Pelvic and perineal pain: Secondary | ICD-10-CM | POA: Diagnosis not present

## 2017-11-03 DIAGNOSIS — R609 Edema, unspecified: Secondary | ICD-10-CM | POA: Diagnosis not present

## 2017-11-03 DIAGNOSIS — C786 Secondary malignant neoplasm of retroperitoneum and peritoneum: Secondary | ICD-10-CM

## 2017-11-03 DIAGNOSIS — C7800 Secondary malignant neoplasm of unspecified lung: Secondary | ICD-10-CM

## 2017-11-03 DIAGNOSIS — Z95828 Presence of other vascular implants and grafts: Secondary | ICD-10-CM

## 2017-11-03 DIAGNOSIS — Z5111 Encounter for antineoplastic chemotherapy: Secondary | ICD-10-CM | POA: Diagnosis not present

## 2017-11-03 DIAGNOSIS — N920 Excessive and frequent menstruation with regular cycle: Secondary | ICD-10-CM

## 2017-11-03 DIAGNOSIS — R600 Localized edema: Secondary | ICD-10-CM

## 2017-11-03 LAB — CMP (CANCER CENTER ONLY)
ALT: 49 U/L (ref 0–55)
AST: 41 U/L — ABNORMAL HIGH (ref 5–34)
Albumin: 3.5 g/dL (ref 3.5–5.0)
Alkaline Phosphatase: 84 U/L (ref 40–150)
Anion gap: 11 (ref 3–11)
BUN: 22 mg/dL (ref 7–26)
CO2: 25 mmol/L (ref 22–29)
Calcium: 9.5 mg/dL (ref 8.4–10.4)
Chloride: 93 mmol/L — ABNORMAL LOW (ref 98–109)
Creatinine: 1.19 mg/dL — ABNORMAL HIGH (ref 0.60–1.10)
GFR, Est AFR Am: >60 mL/min (ref >60)
GFR, Estimated: 52 mL/min — ABNORMAL LOW (ref >60)
Glucose, Bld: 100 mg/dL (ref 70–140)
Potassium: 3.8 mmol/L (ref 3.5–5.1)
Sodium: 129 mmol/L — ABNORMAL LOW (ref 136–145)
Total Bilirubin: 3.4 mg/dL — ABNORMAL HIGH (ref 0.2–1.2)
Total Protein: 6.8 g/dL (ref 6.4–8.3)

## 2017-11-03 MED ORDER — SODIUM CHLORIDE 0.9% FLUSH
10.0000 mL | INTRAVENOUS | Status: DC | PRN
  Administered 2017-11-03: 10 mL
  Filled 2017-11-03: qty 10

## 2017-11-03 MED ORDER — HEPARIN SOD (PORK) LOCK FLUSH 100 UNIT/ML IV SOLN
500.0000 [IU] | Freq: Once | INTRAVENOUS | Status: AC
  Administered 2017-11-03: 500 [IU] via INTRAVENOUS
  Filled 2017-11-03: qty 5

## 2017-11-03 MED ORDER — SODIUM CHLORIDE 0.9% FLUSH
10.0000 mL | Freq: Once | INTRAVENOUS | Status: AC
  Administered 2017-11-03: 10 mL via INTRAVENOUS
  Filled 2017-11-03: qty 10

## 2017-11-03 NOTE — Progress Notes (Signed)
[facility] Progress Note   Nancy [name] [id number] 10-30-1965 52 y.o.  Nancy [name] is managed by Dr. Truitt Merle  Actively treated with chemotherapy: no   Assessment: Plan:    Port-A-Cath in place - Plan: sodium chloride flush (NS) 0.9 % injection 10 mL, heparin lock flush 100 unit/mL  Bilateral lower extremity edema  Pelvic cramping  Spotting  Hyperbilirubinemia   Bilateral lower extremity edema: Elevate lower extremities as needed, limited sodium intake, consider using thigh high compression stocking during the day.  Pelvic cramping and spotting: The patient's labs are reviewed.  Her hemoglobin and hematocrit are slightly lower from 2 weeks ago.  Her platelet count is within normal limits.  I reassured her and told her to return or call should this recur.  Hyperbilirubinemia: The patient's chemistry panel from today shows that her total bilirubin is again lower at 3.4.  This is down from 4.3 from 4 days ago.  She is scheduled to return to be seen again on 11/07/2017.  Please see After Visit Summary for patient specific instructions.  Future Appointments  Date Time Provider [place]  11/07/2017 10:45 AM [facility] LAB 1 [facility] None  11/07/2017 11:00 AM [facility] FLUSH NURSE 2 [facility] None  11/07/2017 11:30 AM Alla Feeling, NP [facility] None  11/07/2017 12:30 PM [facility] I27 DNS [facility] None  11/14/2017 10:15 AM [facility] LAB 2 [facility] None  11/14/2017 10:45 AM [facility] FLUSH NURSE 2 [facility] None  11/14/2017 11:15 AM Truitt Merle, MD [facility] None  11/14/2017 12:45 PM [facility] B7 [facility] None    No orders of the defined types were placed in this encounter.      Subjective:   Patient ID:  Nancy [name] is a 52 y.o. (DOB Aug 31, 1965) female.  Chief Complaint:  Chief Complaint  Patient presents with  . 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  She is managed by Dr. Truitt Merle and was last seen in our office on 10/31/2017.  She has recently been seen at [facility] by Dr. Leamon Arnt for consideration of a clinical trial at the [facility] in [place].  She presents to the clinic today with a report of a cramping sensation in her pelvic area which is similar to menstrual cramps.  She had spotting yesterday but has had none today.  Her last period was on 04/2016.  She reports having edema in her lower extremities with her legs feeling heavy.  She was dizzy this morning.  She also reports random episodes of twitching of her upper body.  Medications: I have reviewed the patient's current medications.  Allergies:  Allergies  Allergen Reactions  . Penicillins Anaphylaxis    Has patient had a PCN reaction causing immediate rash, facial/tongue/throat swelling, SOB or lightheadedness with hypotensionYes Swelling in throat  Has patient had a PCN reaction causing severe rash involving mucus membranes or skin necrosis:/No Has patient had a PCN reaction that required hospitalization/No Has patient had a PCN reaction occurring within the last 10 years:NO If all of the above answers are "NO", then may proceed with Cephalosporin use.   . Contrast Media [Iodinated Diagnostic Agents] Other (See Comments)    Tingling under left rib area   . Lipitor [Atorvastatin] Palpitations    Tingling, flushing    Past Medical History:  Diagnosis Date  . Allergy    SEASONAL  .  Anemia    [employer]  . Anxiety   . Atopic eczema   . cholangio ca dx'd 07/12/16  . Depression   . Family history of adverse reaction to anesthesia    Mother has allergic reactions stops breathing  . Fibrocystic breast disease   . GERD (gastroesophageal reflux disease)   . Hx of migraines    seasonal   . Personal history of chemotherapy   . Pneumonia    2012  . TIA  (transient ischemic attack)    Questionable migraine    Past Surgical History:  Procedure Laterality Date  . ENDOSCOPIC RETROGRADE CHOLANGIOPANCREATOGRAPHY (ERCP) WITH PROPOFOL N/A 10/20/2017   Procedure: ENDOSCOPIC RETROGRADE CHOLANGIOPANCREATOGRAPHY (ERCP) WITH PROPOFOL;  Surgeon: [name], MD;  Location: [facility];  Service: Endoscopy;  Laterality: N/A;  . EUS N/A 07/07/2016   Procedure: UPPER ENDOSCOPIC ULTRASOUND (EUS) RADIAL;  Surgeon: [name], MD;  Location: [facility];  Service: Endoscopy;  Laterality: N/A;  . IR GENERIC HISTORICAL  08/03/2016   IR US GUIDE VASC ACCESS RIGHT 08/03/2016 Corrie Mckusick, DO [facility]  . IR GENERIC HISTORICAL  08/03/2016   IR FLUORO GUIDE PORT INSERTION RIGHT 08/03/2016 Corrie Mckusick, DO [facility]  . MYOMECTOMY  2002    Family History  Problem Relation Age of Onset  . Gout Mother   . Diabetes Mother   . Hypertension Father   . Diabetes Father   . Breast cancer Paternal Aunt   . Cancer Maternal Grandfather        throat cancer   . Cancer Cousin 26       GI cancer   . Thyroid disease Neg Hx     Social History   Socioeconomic History  . Marital status: Married    Spouse name: Not on file  . Number of children: 0  . Years of education: 70  . Highest education level: Not on file  Social Needs  . Financial resource strain: Not on file  . Food insecurity - worry: Not on file  . Food insecurity - inability: Not on file  . Transportation needs - medical: Not on file  . Transportation needs - non-medical: Not on file  Occupational History  . Occupation: HR fml[employer]    Employer: [employer]    Employer: [employer]  Tobacco Use  . Smoking status: Never Smoker  . Smokeless tobacco: Never Used  Substance and Sexual Activity  . Alcohol use: No    Frequency: Never  . Drug use: No  . Sexual activity: Yes    Partners: Male  Other Topics Concern  . Not on file  Social History Narrative  . Not on file    Past Medical  History, Surgical history, Social history, and Family history were reviewed and updated as appropriate.   Please see review of systems for further details on the patient's review from today.   Review of Systems:  Review of Systems  Constitutional: Negative for appetite change, chills, diaphoresis and fever.  HENT: Negative for trouble swallowing.   Respiratory: Negative for cough, choking, chest tightness, shortness of breath and wheezing.   Cardiovascular: Positive for leg swelling. Negative for chest pain.  Gastrointestinal: Negative for abdominal distention, abdominal pain, constipation, diarrhea, nausea and vomiting.  Genitourinary: Positive for pelvic pain and vaginal bleeding. Negative for difficulty urinating.  Neurological:       Episodic twitching of the upper extremities.    Objective:   Physical Exam:  BP 123/77 (BP Location: Left Arm,  Patient Position: Sitting)   Pulse 92   Temp 99 F (37.2 C) (Oral)   Resp 17   Ht 5\' 6"  (1.676 m)   Wt 115 lb 9.6 oz (52.4 kg)   LMP 05/20/2016   SpO2 100%   BMI 18.66 kg/m  ECOG: 1  Physical Exam  Constitutional: No distress.  HENT:  Head: Normocephalic and atraumatic.  Mouth/Throat: Oropharynx is clear and moist.  Eyes: Right eye exhibits no discharge. Left eye exhibits no discharge. Scleral icterus is present.  Neck: Normal range of motion.  Cardiovascular: Normal rate, regular rhythm and normal heart sounds. Exam reveals no gallop and no friction rub.  No murmur heard. Pulmonary/Chest: Effort normal and breath sounds normal. No respiratory distress. She has no wheezes. She has no rales.  Abdominal: Soft. Bowel sounds are normal. She exhibits no distension and no mass. There is no tenderness. There is no rebound and no guarding.  Musculoskeletal: She exhibits edema (Trace  to 1+ pitting edema to the knees.).  Neurological: She is alert. Coordination normal.  Skin: Skin is warm and dry. She is not diaphoretic.  Psychiatric:  She has a normal mood and affect. Her behavior is normal. Judgment and thought content normal.    Lab Review:     Component Value Date/Time   NA 129 (L) 11/03/2017 1440   NA 137 08/31/2017 0819   K 3.8 11/03/2017 1440   K 3.4 (L) 08/31/2017 0819   CL 93 (L) 11/03/2017 1440   CO2 25 11/03/2017 1440   CO2 21 (L) 08/31/2017 0819   GLUCOSE 100 11/03/2017 1440   GLUCOSE 103 08/31/2017 0819   BUN 22 11/03/2017 1440   BUN 11.4 08/31/2017 0819   CREATININE 1.19 (H) 11/03/2017 1440   CREATININE 0.8 08/31/2017 0819   CALCIUM 9.5 11/03/2017 1440   CALCIUM 9.3 08/31/2017 0819   PROT 6.8 11/03/2017 1440   PROT 7.1 08/31/2017 0819   ALBUMIN 3.5 11/03/2017 1440   ALBUMIN 4.0 08/31/2017 0819   AST 41 (H) 11/03/2017 1440   AST 20 08/31/2017 0819   ALT 49 11/03/2017 1440   ALT 13 08/31/2017 0819   ALKPHOS 84 11/03/2017 1440   ALKPHOS 32 (L) 08/31/2017 0819   BILITOT 3.4 (H) 11/03/2017 1440   BILITOT 0.77 08/31/2017 0819   GFRNONAA 52 (L) 11/03/2017 1440   GFRAA >60 11/03/2017 1440       Component Value Date/Time   WBC 5.5 10/31/2017 0921   WBC 2.5 (L) 10/03/2017 1120   RBC 3.01 (L) 10/31/2017 0921   HGB 9.8 (L) 10/03/2017 1120   HGB 10.0 (L) 08/31/2017 0820   HCT 26.4 (L) 10/31/2017 0921   HCT 30.0 (L) 08/31/2017 0820   PLT 245 10/31/2017 0921   PLT 198 08/31/2017 0820   MCV 87.7 10/31/2017 0921   MCV 86.4 08/31/2017 0820   MCH 28.6 10/31/2017 0921   MCHC 32.6 10/31/2017 0921   RDW 16.7 (H) 10/31/2017 0921   RDW 15.6 (H) 08/31/2017 0820   LYMPHSABS 0.5 (L) 10/31/2017 0921   LYMPHSABS 1.0 08/31/2017 0820   MONOABS 0.3 10/31/2017 0921   MONOABS 0.2 08/31/2017 0820   EOSABS 0.0 10/31/2017 0921   EOSABS 0.0 08/31/2017 0820   BASOSABS 0.0 10/31/2017 0921   BASOSABS 0.0 08/31/2017 0820   -------------------------------  Imaging from last 24 hours (if applicable):  Radiology interpretation: Ct Chest W Contrast  Result Date: 10/17/2017 CLINICAL DATA:  Restaging metastatic  the aorta is normal in caliber. No dissection. The branch vessels  are patent. Stable coronary artery calcifications. Cholangiocarcinoma. EXAM: CT CHEST, ABDOMEN, AND PELVIS WITH CONTRAST TECHNIQUE: Multidetector CT imaging of the chest, abdomen and pelvis was performed following the standard protocol during bolus administration of intravenous contrast. CONTRAST:  23mL ISOVUE-300 IOPAMIDOL (ISOVUE-300) INJECTION 61% COMPARISON:  No mediastinal mass or lymphadenopathy. Stable 10 mm right hilar lymph node on image number 26. FINDINGS: CT CHEST FINDINGS Cardiovascular: The heart is normal in size. No pericardial effusion. The aorta and branch vessels are normal. Stable coronary artery calcifications. The Port-A-Cath is stable. Mediastinum/Nodes: No mediastinal lymphadenopathy. 10 mm right hilar lymph node is stable. The esophagus is grossly normal. Lungs/Pleura: 3 mm left upper lobe pulmonary nodule is stable. New irregular 5.5 mm right upper lobe pulmonary nodule on image number 57. New 3.5 mm right upper lobe nodule on image number 54. New 4 mm right middle lobe pulmonary nodule on image number 100. Linear appearing nodular density in the right middle lobe on image number 73 measures 3.4 mm and appears stable. Small perifissural nodules are noted bilaterally and are likely lymph nodes. Musculoskeletal: No significant bony findings. No breast masses, supraclavicular or axillary lymphadenopathy. Thyroid gland appears normal. CT ABDOMEN PELVIS FINDINGS Hepatobiliary: Enlarging partially necrotic central liver mass/cholangiocarcinoma measuring a maximum of 29 x 21 mm on image number 53. This previously measured 23 x 18.5 mm. Progressive intrahepatic biliary dilatation and both lobes of the liver. Stable elongated lesion along the peritoneal surface of the liver at the falciform ligament measuring 25 mm. Several new small hepatic lesions consistent with metastatic disease. The largest measures 11 mm at the hepatic dome on  image number 42. Pancreas: No mass, inflammation or ductal dilatation. Spleen: Normal size. No focal lesions. No definite peritoneal surface disease. Adrenals/Urinary Tract: The adrenal glands and kidneys appear normal in stable. The bladder is unremarkable. Stomach/Bowel: The stomach, duodenum, small bowel and colon are grossly normal. No mass lesions or obstructive findings. The sigmoid colon is being fairly significantly compressed by the large pelvic mass. Vascular/Lymphatic: Stable scattered atherosclerotic calcifications involving the aorta but the branch vessels are patent and the major venous structures are still patent. The periportal lymphadenopathy on image number 57 measures 4.1 x 3.3 cm and previously measured 3.8 x 3.0 cm. This is surrounding and compressing the IVC. Peritoneal implants are again demonstrated. The lesion in the left upper pelvis adjacent to the colon measures 2.1 x 1.4 cm and previously measured 1.8 x 1.5 cm. Reproductive: Stable fibroid uterus. Other: Large cystic pelvic mass has enlarged since the prior CT scan. It now extends up to the region of the umbilicus and measures 32.9 x 13 cm. Previously measured 11 x 9.5 cm. Musculoskeletal: No significant bony findings. IMPRESSION: 1. Interval slight enlargement of the central liver lesions/cholangiocarcinoma with progressive intrahepatic biliary dilatation. 2. New pulmonary metastatic lesions and several new hepatic metastatic lesions. 3. Slight interval enlargement of the periportal adenopathy surrounding the IVC. 4. Significant interval enlargement of the large cystic and solid pelvic mass now extending up to the level of the umbilicus 5. Slight enlargement of the peritoneal implant in the left pelvis. Electronically Signed   By: Marijo Sanes M.D.   On: 10/17/2017 16:37   Ct Abdomen Pelvis W Contrast  Result Date: 10/17/2017 CLINICAL DATA:  Restaging metastatic the aorta is normal in caliber. No dissection. The branch vessels are  patent. Stable coronary artery calcifications. Cholangiocarcinoma. EXAM: CT CHEST, ABDOMEN, AND PELVIS WITH CONTRAST TECHNIQUE: Multidetector CT imaging of the chest, abdomen and pelvis was  performed following the standard protocol during bolus administration of intravenous contrast. CONTRAST:  39mL ISOVUE-300 IOPAMIDOL (ISOVUE-300) INJECTION 61% COMPARISON:  No mediastinal mass or lymphadenopathy. Stable 10 mm right hilar lymph node on image number 26. FINDINGS: CT CHEST FINDINGS Cardiovascular: The heart is normal in size. No pericardial effusion. The aorta and branch vessels are normal. Stable coronary artery calcifications. The Port-A-Cath is stable. Mediastinum/Nodes: No mediastinal lymphadenopathy. 10 mm right hilar lymph node is stable. The esophagus is grossly normal. Lungs/Pleura: 3 mm left upper lobe pulmonary nodule is stable. New irregular 5.5 mm right upper lobe pulmonary nodule on image number 57. New 3.5 mm right upper lobe nodule on image number 54. New 4 mm right middle lobe pulmonary nodule on image number 100. Linear appearing nodular density in the right middle lobe on image number 73 measures 3.4 mm and appears stable. Small perifissural nodules are noted bilaterally and are likely lymph nodes. Musculoskeletal: No significant bony findings. No breast masses, supraclavicular or axillary lymphadenopathy. Thyroid gland appears normal. CT ABDOMEN PELVIS FINDINGS Hepatobiliary: Enlarging partially necrotic central liver mass/cholangiocarcinoma measuring a maximum of 29 x 21 mm on image number 53. This previously measured 23 x 18.5 mm. Progressive intrahepatic biliary dilatation and both lobes of the liver. Stable elongated lesion along the peritoneal surface of the liver at the falciform ligament measuring 25 mm. Several new small hepatic lesions consistent with metastatic disease. The largest measures 11 mm at the hepatic dome on image number 42. Pancreas: No mass, inflammation or ductal dilatation.  Spleen: Normal size. No focal lesions. No definite peritoneal surface disease. Adrenals/Urinary Tract: The adrenal glands and kidneys appear normal in stable. The bladder is unremarkable. Stomach/Bowel: The stomach, duodenum, small bowel and colon are grossly normal. No mass lesions or obstructive findings. The sigmoid colon is being fairly significantly compressed by the large pelvic mass. Vascular/Lymphatic: Stable scattered atherosclerotic calcifications involving the aorta but the branch vessels are patent and the major venous structures are still patent. The periportal lymphadenopathy on image number 57 measures 4.1 x 3.3 cm and previously measured 3.8 x 3.0 cm. This is surrounding and compressing the IVC. Peritoneal implants are again demonstrated. The lesion in the left upper pelvis adjacent to the colon measures 2.1 x 1.4 cm and previously measured 1.8 x 1.5 cm. Reproductive: Stable fibroid uterus. Other: Large cystic pelvic mass has enlarged since the prior CT scan. It now extends up to the region of the umbilicus and measures 29.4 x 13 cm. Previously measured 11 x 9.5 cm. Musculoskeletal: No significant bony findings. IMPRESSION: 1. Interval slight enlargement of the central liver lesions/cholangiocarcinoma with progressive intrahepatic biliary dilatation. 2. New pulmonary metastatic lesions and several new hepatic metastatic lesions. 3. Slight interval enlargement of the periportal adenopathy surrounding the IVC. 4. Significant interval enlargement of the large cystic and solid pelvic mass now extending up to the level of the umbilicus 5. Slight enlargement of the peritoneal implant in the left pelvis. Electronically Signed   By: Marijo Sanes M.D.   On: 10/17/2017 16:37   Dg Ercp Biliary & Pancreatic Ducts  Result Date: 10/20/2017 CLINICAL DATA:  52 year old female with biliary ductal obstruction EXAM: ERCP TECHNIQUE: Multiple spot images obtained with the fluoroscopic device and submitted for  interpretation post-procedure. FLUOROSCOPY TIME:  Fluoroscopy Time: 13 minutes 48 seconds 47.2 mGy reported COMPARISON:  None. FINDINGS: A total of 7 intraoperative saved images are submitted for review. The images demonstrate a flexible endoscope in the descending duodenum with wire cannulation of the  pancreatic and common bile ducts. Limited cholangiogram demonstrates marked dilatation of the common bile duct versus the cystic duct with an abrupt occlusion in the mid segment. IMPRESSION: 1. Overlapping and markedly dilated common bile duct and cystic duct both with abrupt occlusion consistent with malignant obstruction. 2. Please see endoscopic procedural note for further detail. These images were submitted for radiologic interpretation only. Please see the procedural report for the amount of contrast and the fluoroscopy time utilized. Electronically Signed   By: Jacqulynn Cadet M.D.   On: 10/20/2017 17:31

## 2017-11-03 NOTE — Telephone Encounter (Signed)
Pt called requesting a call back from nurse.  Spoke with pt and was informed that for last 24 hours, pt experienced bilateral feet and ankles swelling.  Stated legs feeling heavier.   Denied fever, denied pain. Stated she also had menstrual cramp like abdominal pain - did have mild spotting noted. Wanted to know if she could come to [facility] for further evaluation today.   Dr. Burr Medico notified.   Gave pt appts date and time for [facility] visit today.  Pt voiced understanding.  Schedule message sent. Pt's    Phone      [phone].

## 2017-11-07 ENCOUNTER — Inpatient Hospital Stay (HOSPITAL_BASED_OUTPATIENT_CLINIC_OR_DEPARTMENT_OTHER): Payer: Federal, State, Local not specified - PPO | Admitting: Nurse Practitioner

## 2017-11-07 ENCOUNTER — Inpatient Hospital Stay: Payer: Federal, State, Local not specified - PPO

## 2017-11-07 ENCOUNTER — Encounter: Payer: Self-pay | Admitting: Nurse Practitioner

## 2017-11-07 VITALS — BP 115/68 | HR 96 | Temp 98.8°F | Resp 18

## 2017-11-07 VITALS — BP 113/79 | HR 102 | Temp 98.4°F | Resp 17 | Ht 66.0 in | Wt 117.5 lb

## 2017-11-07 DIAGNOSIS — R911 Solitary pulmonary nodule: Secondary | ICD-10-CM | POA: Diagnosis not present

## 2017-11-07 DIAGNOSIS — Z7189 Other specified counseling: Secondary | ICD-10-CM

## 2017-11-07 DIAGNOSIS — G620 Drug-induced polyneuropathy: Secondary | ICD-10-CM

## 2017-11-07 DIAGNOSIS — E860 Dehydration: Secondary | ICD-10-CM

## 2017-11-07 DIAGNOSIS — D649 Anemia, unspecified: Secondary | ICD-10-CM

## 2017-11-07 DIAGNOSIS — C787 Secondary malignant neoplasm of liver and intrahepatic bile duct: Principal | ICD-10-CM

## 2017-11-07 DIAGNOSIS — M545 Low back pain: Secondary | ICD-10-CM

## 2017-11-07 DIAGNOSIS — R109 Unspecified abdominal pain: Secondary | ICD-10-CM | POA: Diagnosis not present

## 2017-11-07 DIAGNOSIS — C221 Intrahepatic bile duct carcinoma: Secondary | ICD-10-CM

## 2017-11-07 DIAGNOSIS — C7800 Secondary malignant neoplasm of unspecified lung: Secondary | ICD-10-CM

## 2017-11-07 DIAGNOSIS — C7801 Secondary malignant neoplasm of right lung: Principal | ICD-10-CM

## 2017-11-07 DIAGNOSIS — R110 Nausea: Secondary | ICD-10-CM | POA: Diagnosis not present

## 2017-11-07 DIAGNOSIS — B370 Candidal stomatitis: Secondary | ICD-10-CM

## 2017-11-07 DIAGNOSIS — R609 Edema, unspecified: Secondary | ICD-10-CM

## 2017-11-07 DIAGNOSIS — K5900 Constipation, unspecified: Secondary | ICD-10-CM | POA: Diagnosis not present

## 2017-11-07 DIAGNOSIS — R630 Anorexia: Secondary | ICD-10-CM

## 2017-11-07 DIAGNOSIS — F419 Anxiety disorder, unspecified: Secondary | ICD-10-CM | POA: Diagnosis not present

## 2017-11-07 DIAGNOSIS — C786 Secondary malignant neoplasm of retroperitoneum and peritoneum: Secondary | ICD-10-CM | POA: Diagnosis not present

## 2017-11-07 DIAGNOSIS — Z5111 Encounter for antineoplastic chemotherapy: Secondary | ICD-10-CM | POA: Diagnosis not present

## 2017-11-07 LAB — CBC WITH DIFFERENTIAL (CANCER CENTER ONLY)
Basophils Absolute: 0 10*3/uL (ref 0.0–0.1)
Basophils Relative: 1 %
Eosinophils Absolute: 0 10*3/uL (ref 0.0–0.5)
Eosinophils Relative: 0 %
HCT: 22.5 % — ABNORMAL LOW (ref 34.8–46.6)
Hemoglobin: 7.4 g/dL — ABNORMAL LOW (ref 11.6–15.9)
Lymphocytes Relative: 22 %
Lymphs Abs: 1 10*3/uL (ref 0.9–3.3)
MCH: 28.6 pg (ref 25.1–34.0)
MCHC: 33.1 g/dL (ref 31.5–36.0)
MCV: 86.4 fL (ref 79.5–101.0)
Monocytes Absolute: 0.4 10*3/uL (ref 0.1–0.9)
Monocytes Relative: 10 %
Neutro Abs: 3 10*3/uL (ref 1.5–6.5)
Neutrophils Relative %: 67 %
Platelet Count: 295 10*3/uL (ref 145–400)
RBC: 2.61 MIL/uL — ABNORMAL LOW (ref 3.70–5.45)
RDW: 17.5 % — ABNORMAL HIGH (ref 11.2–14.5)
WBC Count: 4.5 10*3/uL (ref 3.9–10.3)

## 2017-11-07 LAB — CMP (CANCER CENTER ONLY)
ALT: 29 U/L (ref 0–55)
AST: 24 U/L (ref 5–34)
Albumin: 3.1 g/dL — ABNORMAL LOW (ref 3.5–5.0)
Alkaline Phosphatase: 68 U/L (ref 40–150)
Anion gap: 9 (ref 3–11)
BUN: 16 mg/dL (ref 7–26)
CO2: 27 mmol/L (ref 22–29)
Calcium: 9.6 mg/dL (ref 8.4–10.4)
Chloride: 95 mmol/L — ABNORMAL LOW (ref 98–109)
Creatinine: 1.33 mg/dL — ABNORMAL HIGH (ref 0.60–1.10)
GFR, Est AFR Am: 53 mL/min — ABNORMAL LOW (ref >60)
GFR, Estimated: 46 mL/min — ABNORMAL LOW (ref >60)
Glucose, Bld: 106 mg/dL (ref 70–140)
Potassium: 4 mmol/L (ref 3.5–5.1)
Sodium: 131 mmol/L — ABNORMAL LOW (ref 136–145)
Total Bilirubin: 2.7 mg/dL — ABNORMAL HIGH (ref 0.2–1.2)
Total Protein: 6.6 g/dL (ref 6.4–8.3)

## 2017-11-07 LAB — PREPARE RBC (CROSSMATCH)

## 2017-11-07 LAB — RETICULOCYTES
RBC.: 2.61 MIL/uL — ABNORMAL LOW (ref 3.70–5.45)
Retic Count, Absolute: 33.9 10*3/uL (ref 33.7–90.7)
Retic Ct Pct: 1.3 % (ref 0.7–2.1)

## 2017-11-07 LAB — ABO/RH: ABO/RH(D): A POS

## 2017-11-07 MED ORDER — SODIUM CHLORIDE 0.9% FLUSH
10.0000 mL | INTRAVENOUS | Status: DC | PRN
  Administered 2017-11-07: 10 mL
  Filled 2017-11-07: qty 10

## 2017-11-07 MED ORDER — OXYCODONE HCL ER 40 MG PO T12A: 40.0000 mg | EXTENDED_RELEASE_TABLET | Freq: Two times a day (BID) | ORAL | 0 refills | Status: DC

## 2017-11-07 MED ORDER — HEPARIN SOD (PORK) LOCK FLUSH 100 UNIT/ML IV SOLN
500.0000 [IU] | Freq: Once | INTRAVENOUS | Status: AC | PRN
  Administered 2017-11-07: 500 [IU]
  Filled 2017-11-07: qty 5

## 2017-11-07 MED ORDER — HEPARIN SOD (PORK) LOCK FLUSH 100 UNIT/ML IV SOLN
250.0000 [IU] | Freq: Once | INTRAVENOUS | Status: DC | PRN
  Filled 2017-11-07: qty 5

## 2017-11-07 MED ORDER — CLOTRIMAZOLE 10 MG MT TROC: 10.0000 mg | Freq: Every day | OROMUCOSAL | 0 refills | Status: AC

## 2017-11-07 MED ORDER — SODIUM CHLORIDE 0.9 % IV SOLN
Freq: Once | INTRAVENOUS | Status: AC
  Administered 2017-11-07: 14:00:00 via INTRAVENOUS

## 2017-11-07 MED ORDER — SODIUM CHLORIDE 0.9 % IV SOLN: Freq: Once | INTRAVENOUS | Status: DC

## 2017-11-07 NOTE — Progress Notes (Addendum)
Nancy [name]  [phone] [phone]  Clinic Follow up Note   Patient Care Team: Carollee Herter, Alferd Apa, DO as PCP - General Pyrtle, Lajuan Lines, MD as Consulting Physician (Gastroenterology) 11/07/2017  SUMMARY OF ONCOLOGIC HISTORY: Oncology History   Metastatic cholangiocarcinoma to lung [facility])   Staging form: Perihilar Bile Ducts, AJCC 7th Edition   - Clinical stage from 07/07/2016: Stage IVB (T2b, N1, M1) - Signed by Truitt Merle, MD on 07/28/2016      Metastatic cholangiocarcinoma to lung ([place])   05/22/2014 Procedure    Routine screening colonoscopy showed a sessile polyp measuring 4 mm in the sigmoid colon, removed, otherwise negative.      06/29/2016 Imaging    CT chest, abdomen and pelvis showed 2 indeterminant nodule in left and the right lung, 4-100mm, indeterminate a hypovascular liver lesions largest in the caudate lobe measuring 2.6 cm, mild abdominal lymphadenopathy in the gastrohepatic ligament and portocaval space.      07/02/2016 Imaging    Abdominal MRI with and without contrast showed 2 lobular lesions in the caudate lobe, and is regular lymph nodes in the gastrohepatic ligament which is adjacent to the liver lesion, suspicious for malignancy. 2 small lesions in the right hepatic lobe are indeterminate, but concerning for metastasis.      07/07/2016 Procedure    EGD was negative, EUS biopsy of the liver and adjacent lymph node      07/07/2016 Initial Diagnosis    Metastatic cholangiocarcinoma to lung ([place])      07/07/2016 Initial Biopsy    Fine-needle aspiration of the liver lesion in caudate lobe and adjacent lymph nodes both showed metastatic adenocarcinoma.      08/11/2016 - 04/25/2017 Chemotherapy    Cisplatin 25 mg/m, gemcitabine 1000 mg/m, on day one and 8, every 21 days   Cisplatin and gemcitabine on Day 1, 8 every 21 days, with neulasta on day 9, Gemcitabine dosed reduced to 800 mg/m due to cytopenias, increased to 900mg /m2 on  12/06/2016. Changes to gemcitabine maintenance therapy on day 1 and 8 every 21 days, from 04/18/2017, gemcitabine was reduced dose to 400 mg/m on 04/25/17       10/18/2016 Imaging    CT CAP  IMPRESSION: 1. Again noted are multifocal liver metastasis. Overall there has been no significant interval change in overall volume of liver metastases. 2. Similar appearance of upper abdominal adenopathy 3. Slight decrease in size of small pulmonary metastases.      01/25/2017 Imaging    CT CAP W Contrast 01/25/17 IMPRESSION: 1. Response to therapy. 2. Similar to minimal decrease in pulmonary nodules/metastasis. 3. Improvement in hepatic disease burden. 4. Decrease in abdominal adenopathy. 5. Significantly age advanced atherosclerosis, including within the coronary arteries. 6. Uterine fibroids. 7. Suspect a complex cyst in the left ovary. Recommend attention on follow-up.       04/13/2017 Imaging    CT CAP W Contrast IMPRESSION: 1. The dominant caudate lobe mass is stable in size and appearance, as is the indistinct adenopathy in the porta hepatis adjacent to the caudate lobe, and in the retroperitoneum. Several subtle additional liver lesions are slightly less conspicuous on today' s exam. The pulmonary nodules are stable, and mild omental nodularity in the left lower quadrant is very slightly worsened compared to prior. Overall the burden of malignancy appears similar to prior, and some of the findings may represent effectively treated metastatic lesions. 2. Complex left adnexal mass, 6.7 by 4.1 cm, enlarging. Pelvic sonography recommended for  further characterization. 3. Other imaging findings of potential clinical significance: Aortic Atherosclerosis (ICD10-I70.0). Coronary atherosclerosis. Prominent stool throughout the colon favors constipation. Fibroid uterus.      05/16/2017 - 06/27/2017 Chemotherapy    Second-line chemo FOLFOX every 2 weeks with neulasta after pump DC. She will  started with low dose FOLFOX on 05/16/17, due to her neutropenia. Held oxaliplatin with cycle two due to poor toleration. Discontinued after cycle 3 (06/27/17) due to disease progression.       07/20/2017 Imaging    IMPRESSION: CT CHEST  1. Interval development of 2 small new pulmonary nodules concerning for progressive, or new metastatic disease. Recommend continued attention on follow-up imaging. 2. The previously noted right upper and left upper lobe pulmonary nodules remain stable in size consistent with treated metastatic disease. 3. Stable position of right IJ approach power injectable port catheter. 4. Coronary artery calcifications. CT ABD/PELVIS  1. Marked interval enlargement of a complex, enhancing, cystic and solid left ovarian mass which now fills the posterior aspect of the pelvis exerting mass effect on the adjacent rectosigmoid colon and bladder. This likely accounts for the patient's lower abdominal pain and sensation of constipation (there is no significant stool burden within the rectum or sigmoid colon). These findings are highly suspicious for a primary ovarian cystic or mucinous adenocarcinoma. 2. Enlarging omental implants in the left lower quadrant highly concerning for metastatic ovarian carcinoma. 3. New/enlarging subcapsular hepatic lesion may represent progressive cholangio or ovarian carcinoma metastases. 4. Enlarging caudate lobe lesion and aortocaval/portacaval confluent necrotic adenopathy concerning for progressive metastatic cholangiocarcinoma. 5. The segment 6 hepatic lesion remains stable or may be smaller on today's examination. These results will be called to the ordering clinician or representative by the Radiologist Assistant, and communication documented in the PACS or zVision Dashboard.        07/30/2017 Imaging     CT AP W Contrast 07/30/17 IMPRESSION: 1. No substantial interval change in exam. 2. Complex cystic and solid mass  filling the cul-de-sac and left adnexal space is similar to prior. There is abnormal soft tissue in the anterior left lower quadrant concerning for metastatic disease. 3. Stable appearance of the caudate lesion in this patient with known cholangiocarcinoma. There is confluent necrotic lymphadenopathy in the porta hepatis, similar to prior with substantial mass-effect on the left renal vein as it enters the IVC. 4. Fibroid change in the uterus. 5. Trace intraperitoneal free fluid.      08/05/2017 Pathology Results    Diagnosis 08/05/17  Peritoneum, biopsy, left lower abdomen METASTATIC ADENOCARCINOMA, CONSISTENT WITH CHOLANGIOCARCINOMA. Microscopic Comment      08/08/2017 Imaging    CT AP WO Contrast 08/08/17 IMPRESSION: 1. No substantial interval change in exam although assessment today limited by lack of intravenous contrast material. 2. Stomach is distended with fluid, similar to prior. 3. Patient's known caudate liver lesion not well demonstrated on today's exam. 4. Similar appearance large complex cystic and solid mass in the cul-de-sac and left adnexal space. 5. Similar appearance abnormal soft tissue in the anterior left lower quadrant. 6. Fibroid change in the uterus.      08/16/2017 -  Chemotherapy    CAPOX: Xeloda '1500mg'$  BID for 1 week on and 1 week off with low dose oxaliplatin starting 08/16/17. Stopped Xeloda 09/19/17 and start 5-FU pump.         10/07/2017 Imaging    CT CAP W CONTRAST IMPRESSION: 1. Interval slight enlargement of the central liver lesions/cholangiocarcinoma with progressive intrahepatic biliary dilatation. 2.  New pulmonary metastatic lesions and several new hepatic metastatic lesions. 3. Slight interval enlargement of the periportal adenopathy surrounding the IVC. 4. Significant interval enlargement of the large cystic and solid pelvic mass now extending up to the level of the umbilicus 5. Slight enlargement of the peritoneal implant in the  left pelvis.      10/20/2017 Procedure    ERCP per Dr. Ardis Hughs: Impression: - Mid bile duct stricture which I was unable to stent.      10/26/2017 Procedure    ERCP per Dr. Lynita Lombard at [facility] digestive health endoscopy: Placement of a covered [id number] metal stent through a common hepatic duct stricture. This was technically difficult as it was a very tight, unusually firm stricture which may be extrinsic, related to nodal metastases or direct tumor infiltration from known cholangiocarcinoma. Following stent deployment, the waist at the stricture remained very narrow. An attempt to advance a dilating balloon both before and after stent placement was not successful.   Patient was observed overnight for pain management and discharged home next day.      CURRENT THERAPY:Third line CAPOX: Xeloda 1500mg  BID for 1 week on and 1 week off with low dose oxaliplatin every 2 weeks starting 08/16/17. Stopped Xeloda 09/19/17 and start 5-FU pump. Last treatment 09/19/17.   INTERVAL HISTORY: Nancy [name] returns for follow up as scheduled. She was seen in symptom management 3/7 for lower extremity edema and abdominal cramp with spotting. Continues to have intermittent abdominal cramping but denies further episodes of vaginal bleeding, no blood in stool. Not bloated. Has not had BM in over 4 days. She takes miralax in the morning and senna in evenings. Plans to add mag citrate next. Eating small "spoonfuls" at a time, thrush is negatively affecting taste. Appetite is present. Voiding adequately. Completes diflucan today. Mild nausea last week but none in last few days. Leg edema is worsening, wears thigh-high compression stockings and spent most of the weekend resting with legs elevated. Does notice some improvement with compression stockings. Very low energy, gets exhausted with minimal exertion.  She denies any pain. Currently on MS contin 60 mg Q12, not requiring any breakthrough medication.   REVIEW OF SYSTEMS:     Constitutional: Denies fevers, chills or abnormal weight loss (+) fatigue Eyes: Denies blurriness of vision Ears, nose, mouth, throat, and face: Denies mucositis or sore throat (+) thrush Respiratory: Denies cough, dyspnea or wheezes Cardiovascular: Denies palpitation, chest discomfort (+)  lower extremity swelling Gastrointestinal:  Denies nausea, vomiting, diarrhea, heartburn or change in bowel habits (+) constipation, no BM >4 days (+) intermittent abdominal cramping  Skin: Denies abnormal skin rashes Lymphatics: Denies new lymphadenopathy or easy bruising Neurological:Denies numbness, tingling or new weaknesses Behavioral/Psych: Mood is stable, no new changes  All other systems were reviewed with the patient and are negative.  MEDICAL HISTORY:  Past Medical History:  Diagnosis Date  . Allergy    SEASONAL  . Anemia    [employer]  . Anxiety   . Atopic eczema   . cholangio ca dx'd 07/12/16  . Depression   . Family history of adverse reaction to anesthesia    Mother has allergic reactions stops breathing  . Fibrocystic breast disease   . GERD (gastroesophageal reflux disease)   . Hx of migraines    seasonal   . Personal history of chemotherapy   . Pneumonia    2012  . TIA (transient ischemic attack)    Questionable migraine    SURGICAL HISTORY: Past Surgical  History:  Procedure Laterality Date  . ENDOSCOPIC RETROGRADE CHOLANGIOPANCREATOGRAPHY (ERCP) WITH PROPOFOL N/A 10/20/2017   Procedure: ENDOSCOPIC RETROGRADE CHOLANGIOPANCREATOGRAPHY (ERCP) WITH PROPOFOL;  Surgeon: [name], MD;  Location: [facility] ENDOSCOPY;  Service: Endoscopy;  Laterality: N/A;  . EUS N/A 07/07/2016   Procedure: UPPER ENDOSCOPIC ULTRASOUND (EUS) RADIAL;  Surgeon: [name], MD;  Location: [facility] ENDOSCOPY;  Service: Endoscopy;  Laterality: N/A;  . IR GENERIC HISTORICAL  08/03/2016   IR US GUIDE VASC ACCESS RIGHT 08/03/2016 Corrie Mckusick, DO [facility]-INTER[facility] RAD  . IR GENERIC HISTORICAL  08/03/2016   IR  FLUORO GUIDE PORT INSERTION RIGHT 08/03/2016 Corrie Mckusick, DO [facility]  . MYOMECTOMY  2002    I have reviewed the social history and family history with the patient and they are unchanged from previous note.  ALLERGIES:  is allergic to penicillins; contrast media [iodinated diagnostic agents]; and lipitor [atorvastatin].  MEDICATIONS:  Current Outpatient Medications  Medication Sig Dispense Refill  . ALPRAZolam (XANAX) 0.5 MG tablet Take 1 tablet (0.5 mg total) by mouth 3 (three) times daily as needed for anxiety. 30 tablet 0  . cetirizine (ZYRTEC) 10 MG tablet Take 10 mg by mouth daily as needed for allergies. For allergies     . Cholecalciferol (VITAMIN D3) 5000 UNITS CAPS Take 5,000 Units by mouth daily.     . COLOSTRUM PO Take 1.25 g by mouth 2 (two) times daily.     Marland Kitchen dicyclomine (BENTYL) 20 MG tablet Take 20 mg by mouth 3 (three) times daily as needed for spasms.     Marland Kitchen dronabinol (MARINOL) 5 MG capsule Take 1 capsule (5 mg total) by mouth 2 (two) times daily before a meal. 60 capsule 1  . fluconazole (DIFLUCAN) 100 MG tablet Take 1 tablet (100 mg total) by mouth daily. 7 tablet 0  . lidocaine-prilocaine (EMLA) cream Apply 1 application topically as needed. Apply to portacath 1 1/2 hours - 2 hours prior to procedures as needed. 30 g 1  . LORazepam (ATIVAN) 0.5 MG tablet Place under tongue and dissolve every 8 hours as needed for nausea. Do not take Xanax while taking this medication. 30 tablet 0  . megestrol (MEGACE) 400 MG/10ML suspension Take 10 mLs (400 mg total) by mouth daily. 240 mL 0  . Menaquinone-7 (VITAMIN K2) 40 MCG TABS Take 40 mg by mouth daily.     . mometasone (ELOCON) 0.1 % lotion Apply topically daily. (Patient taking differently: Apply 1 application topically daily as needed. ) 60 mL 0  . omeprazole (PRILOSEC) 40 MG capsule Take 1 capsule (40 mg total) by mouth 2 (two) times daily before a meal. 60 capsule 1  . ondansetron (ZOFRAN) 8 MG tablet Take 1 tablet (8 mg  total) by mouth every 8 (eight) hours as needed for nausea or vomiting. 30 tablet 3  . Oxycodone HCl 10 MG TABS Take 1 tablet (10 mg total) by mouth every 3 (three) hours as needed. 100 tablet 0  . polyethylene glycol (MIRALAX / GLYCOLAX) packet Take 17 g by mouth 2 (two) times daily. Hold if you develop diarrhea (Patient taking differently: Take 17 g by mouth daily as needed for mild constipation. Hold if you develop diarrhea) 100 each 0  . prenatal vitamin w/FE, FA (PRENATAL 1 + 1) 27-1 MG TABS tablet Take 1 tablet by mouth daily at 12 noon.    . promethazine (PHENERGAN) 25 MG tablet Take 1 tablet (25 mg total) by mouth every 6 (six) hours as needed for  nausea or vomiting. 40 tablet 0  . scopolamine (TRANSDERM-SCOP) 1 MG/3DAYS Place 1 patch (1.5 mg total) onto the skin every 3 (three) days. (Patient taking differently: Place 1 patch onto the skin See admin instructions. Every 72 hours as needed) 10 patch 2  . senna (SENOKOT) 8.6 MG TABS tablet Take 2 tablets (17.2 mg total) by mouth 2 (two) times daily. (Patient taking differently: Take 2 tablets by mouth 2 (two) times daily as needed for mild constipation. ) 120 each 0  . vitamin C (ASCORBIC ACID) 500 MG tablet Take 500 mg by mouth daily.    . clotrimazole (MYCELEX) 10 MG troche Take 1 tablet (10 mg total) by mouth 5 (five) times daily. 35 tablet 0  . oxyCODONE (OXYCONTIN) 40 mg 12 hr tablet Take 1 tablet (40 mg total) by mouth every 12 (twelve) hours. 60 tablet 0   Current Facility-Administered Medications  Medication Dose Route Frequency Provider Last Rate Last Dose  . 0.9 %  sodium chloride infusion   Intravenous Once Alla Feeling, NP       Facility-Administered Medications Ordered in Other Visits  Medication Dose Route Frequency Provider Last Rate Last Dose  . alteplase (CATHFLO ACTIVASE) injection 2 mg  2 mg Intracatheter Once PRN Truitt Merle, MD      . heparin lock flush 100 unit/mL  250 Units Intracatheter Once PRN Truitt Merle, MD      .  ondansetron [facility]) injection 8 mg  8 mg Intravenous Once Truitt Merle, MD      . sodium chloride flush (NS) 0.9 % injection 10 mL  10 mL Intracatheter PRN Truitt Merle, MD   10 mL at 09/09/16 1645  . sodium chloride flush (NS) 0.9 % injection 10 mL  10 mL Intravenous PRN Truitt Merle, MD   10 mL at 02/11/17 1515    PHYSICAL EXAMINATION: ECOG PERFORMANCE STATUS: 3-4  Vitals:   11/07/17 1144  BP: 113/79  Pulse: (!) 102  Resp: 17  Temp: 98.4 F (36.9 C)  SpO2: 100%   Filed Weights   11/07/17 1144  Weight: 117 lb 8 oz (53.3 kg)    GENERAL:alert, no distress and comfortable. (+) cachetic SKIN: skin color, texture, turgor are normal, no rashes or significant lesions EYES: normal, Conjunctiva are pink and non-injected (+) scleral icterus OROPHARYNX: (+) thrush LYMPH:  no palpable cervical or supraclavicular lymphadenopathy  LUNGS: clear to auscultation bilaterally with normal breathing effort HEART: regular rate & rhythm and no murmurs (+) 1+ bilateral lower extremity edema wearing compression stockings ABDOMEN:abdomen soft, non-tender and normal bowel sounds (+) palpable suprapubic mass extending to umbilicus Musculoskeletal:no cyanosis of digits and no clubbing  NEURO: alert & oriented x 3 with fluent speech, no focal motor/sensory deficits PAC without erythema   LABORATORY DATA:  I have reviewed the data as listed CBC Latest Ref Rng & Units 11/07/2017 10/31/2017 10/17/2017  WBC 3.9 - 10.3 K/uL 4.5 5.5 2.8(L)  Hemoglobin 11.6 - 15.9 g/dL - - -  Hematocrit 34.8 - 46.6 % 22.5(L) 26.4(L) 28.8(L)  Platelets 145 - 400 K/uL 295 245 211     CMP Latest Ref Rng & Units 11/07/2017 11/03/2017 10/31/2017  Glucose 70 - 140 mg/dL 106 100 111  BUN 7 - 26 mg/dL 16 22 21   Creatinine 0.60 - 1.10 mg/dL 1.33(H) 1.19(H) 1.24(H)  Sodium 136 - 145 mmol/L 131(L) 129(L) 133(L)  Potassium 3.5 - 5.1 mmol/L 4.0 3.8 3.5  Chloride 98 - 109 mmol/L 95(L) 93(L) 95(L)  CO2 22 -  29 mmol/L 27 25 22   Calcium 8.4 - 10.4  mg/dL 9.6 9.5 9.6  Total Protein 6.4 - 8.3 g/dL 6.6 6.8 6.9  Total Bilirubin 0.2 - 1.2 mg/dL 2.7(H) 3.4(H) 4.3(HH)  Alkaline Phos 40 - 150 U/L 68 84 96  AST 5 - 34 U/L 24 41(H) 47(H)  ALT 0 - 55 U/L 29 49 72(H)   CA19.9 (0-35U/ml) 07/28/2016: 36 08/18/2016: 51 09/13/2016: 56 10/19/2016: 43 11/09/2016: 44 12/06/2016: 41 01/05/2017: 35 02/02/2017: 37 03/07/17: 43 04/18/17: 66 05/16/17: 111 06/08/17: 119 06/18/17: 107 07/18/17: 161 08/16/17: 272 09/19/17: 669 10/17/17: 1826  CA 125 07/25/17: 100.6   PATHOLOGY    Diagnosis 08/05/17  Peritoneum, biopsy, left lower abdomen METASTATIC ADENOCARCINOMA, CONSISTENT WITH CHOLANGIOCARCINOMA. Microscopic Comment The adenocarcinoma stains positive for ck7, negative for ER, GATA 3, ck20, cdx2, and TTF-1. Pax 8 (patchy stain). The morphology and immunostain pattern support the above diagnosis. The block B is sent to [facility] for further molecular testing.   Diagnosis 07/07/2016 FINE NEEDLE ASPIRATION, ENDOSCOPIC, LIVER (SPECIMEN 2 OF 2 COLLECTED 07/07/16): MALIGNANT CELLS CONSISTENT WITH ADENOCARCINOMA.  ADDITIONAL INFORMATION: Per request, immunohistochemistry was performed. The cells are positive for cytokeratin 7 and negative for cytokeratin 20 and CDX-2. This excludes a colorectal primary. Pancreaticobiliary and upper GI sources remain in the differential. Called to Dr. Burr Medico on 07/28/2016.  Diagnosis 07/07/2016 FINE NEEDLE ASPIRATION, ENDOSCOPIC, GASTRO-HEPATIC LYMPH NODE (SPECIMEN 1 OF 2 COLLECTED 07/07/16): MALIGNANT CELLS CONSISTENT WITH ADENOCARCINOMA, SEE COMMENT. Preliminary Diagnosis Intraoperative Diagnosis: Few atypical glandular clusters - recommend additional material. 4) Adequate ([facility])     RADIOGRAPHIC STUDIES: I have personally reviewed the radiological images as listed and agreed with the findings in the report. No results found.   ASSESSMENT & PLAN: 52 y.o.African-American female, without significant  past medical history, presented with abdominal pain  1. Metastatic intrahepatic cholangiocarcinoma, with probable node,lung and peritonealmetastasis, cT2bN1M1, stage IV Nancy [name] appears stable. Dr. Burr Medico has reached out to academic centers for clinical trials she may qualify for. Her performance status has not improved much. Weight is increased but I suspect this may be fluid related. Po intake remains poor, thrush did not response to diflucan. Will prescribe local therapy with mycelex troches. Continue close f/u and supportive care while she recovers. 1 L NS today; f/u in 1 week with IVF.  2. Anemia  -Hgb has been trending down lately. No chemo since 09/19/17. Reticulocytes normal, not likely hemolysis. Had 1 episode of vaginal spotting last week, anemia seems out of proportion to minimal blood loss. Will transfuse 2 units RBCs this week for symptomatic anemia and monitor closely; 1 Unit today with IVF and 1 unit 3/13. Side effects and benefits discussed with the patient in detail, she consents to blood transfusion.   3. Lower extremity edema -Likely secondary to poor nutrition, low albumin, imobility, and her overall condition. Improved with compression stockings. I encouraged her to mobilize frequently but elevate legs when resting. Diuretic not indicated at this time given elevated creatinine and frequent dehydration.   2. Obstructive juandice -secondary to obstruction from cholangiocarcinoma. S/p ERCP with stenting 10/26/17. LFTs normalized, bili 2.7; scleral icterus improving. Will monitor.   3. Abdominal pain, back pain -Her pain is significantly improved lately. Will scale back long acting pain medication to Oxycontin 40 mg Q12, new prescription sent. This will hopefully improve constipation.   4. Nausea, anorexia, weight loss Nausea is controlled. She has an appetite but po intake is impacted by oral thrush. She completed diflucan without improvement. Will  treat with mycelex troches,  prescribed today.   5. Goals of care discussion  -Her condition has not improved much, she is not a candidate for systemic therapy at this time. Dr. Burr Medico continues to look for clinical trial. However, if her performance status and labs do not improve, she will not likely be a candidate. She denies need for home health referral today. Will monitor.   6. Constipation -No BM in >4 days despite miralax and stool softener, likely related to opioid medication, poor nutrition, and dehydration. Will take mag citrate today to induce BM. 10/17/17 CT report indicates there is some degree the sigmoid colon is being compressed by the large pelvic mass, which may also be contributing.     7. 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   8. Anxiety  -stable on Xanax 2-3 times per week PRN   PLAN: -1 L NS today  -1 unit RBC today, 2nd unit 11/09/17 -Begin Mag citrate for BM PRN -Stop Oxycontin 60 mg q12 -New prescription for Oxycontin 40 mg q12 -Clotrimazole troches for thrush    Orders Placed This Encounter  Procedures  . Practitioner attestation of consent    I, the ordering practitioner, attest that I have discussed with the patient the benefits, risks, side effects, alternatives, likelihood of achieving goals and potential problems during recovery for the procedure listed.    Standing Status:   Future    Standing Expiration Date:   11/07/2018    Order Specific Question:   Procedure    Answer:   Blood Product(s)  . Complete patient signature process for consent form    Standing Status:   Future    Standing Expiration Date:   11/07/2018  . Care order/instruction    Transfuse Parameters    Standing Status:   Future    Standing Expiration Date:   11/07/2018  . Type and screen    Standing Status:   Future    Number of Occurrences:   1    Standing Expiration Date:   11/08/2018   All questions were answered. The patient knows to call the clinic with any problems, questions or concerns.  No barriers to learning was detected. I spent 40 minutes counseling the patient face to face. The total time spent in the appointment was 45 minutes and more than 50% was on counseling and review of test results     Alla Feeling, NP 11/07/17    Addendum  I have seen the patient, examined her. I agree with the assessment and and plan and have edited the notes.   Jewelianna's hyperbilirubinemia has slightly improved, however she has developed worsening anemia, no clinical signs of bleeding.  Overall very fatigue, and weak, not able to do much at home. Will give her blood transfusion today. I am very concerned that her overall condition is deteriorating, she will unlikely be quality for clinical trials, or further chemotherapy. I have discussed the goal of therapy is palliative, and likely will transition her to comfort care soon.  I had a frank discussion with her husband separately (he was not with her) also today, he seems to be very realistic, and understands the situation well.  I plan to see her back next week for follow-up and decide about hospice referral.   Truitt Merle  11/07/2017

## 2017-11-07 NOTE — Patient Instructions (Signed)

## 2017-11-08 ENCOUNTER — Encounter: Payer: Self-pay | Admitting: *Deleted

## 2017-11-08 NOTE — Progress Notes (Signed)
[place] [employer]  Hol[employer] representative met with patient's husband in [place] office to offer support (11/07/17).  Patients husband had requested to meet with CSW to discuss patients treatment status and how to best support her.  CSW and patients husband discussed common feelings and emotions when given information on disease progression and limited treatment options.  CSW and patients husband discussed re identifying hope as they enter this stage of the treatment journey.  CSW and patient also discussed self-care for the caregiver.  CSW provided additional emotional support as patients husband processed his feelings and concerns.  CSW also met with patient in the infusion room to offer support.  Patient was appreciative of CSW contact.  CSW plans to meet with patient and husband at her next appointment.  CSW encouraged patient and husband to call with any questions or concerns.          Johnnye Lana, MSW, LCSW, OSW-C Clinical Social Worker [facility] [phone]

## 2017-11-09 ENCOUNTER — Other Ambulatory Visit: Payer: Self-pay | Admitting: *Deleted

## 2017-11-09 ENCOUNTER — Inpatient Hospital Stay: Payer: Federal, State, Local not specified - PPO

## 2017-11-09 DIAGNOSIS — D638 Anemia in other chronic diseases classified elsewhere: Secondary | ICD-10-CM

## 2017-11-09 DIAGNOSIS — D649 Anemia, unspecified: Secondary | ICD-10-CM

## 2017-11-09 DIAGNOSIS — Z5111 Encounter for antineoplastic chemotherapy: Secondary | ICD-10-CM | POA: Diagnosis not present

## 2017-11-09 LAB — PREPARE RBC (CROSSMATCH)

## 2017-11-09 MED ORDER — SODIUM CHLORIDE 0.9% FLUSH
10.0000 mL | INTRAVENOUS | Status: DC | PRN
  Filled 2017-11-09: qty 10

## 2017-11-09 MED ORDER — HEPARIN SOD (PORK) LOCK FLUSH 100 UNIT/ML IV SOLN
250.0000 [IU] | INTRAVENOUS | Status: DC | PRN
  Filled 2017-11-09: qty 5

## 2017-11-09 MED ORDER — FUROSEMIDE 20 MG PO TABS
10.0000 mg | ORAL_TABLET | Freq: Once | ORAL | Status: AC
  Administered 2017-11-09: 10 mg via ORAL

## 2017-11-09 MED ORDER — HEPARIN SOD (PORK) LOCK FLUSH 100 UNIT/ML IV SOLN
500.0000 [IU] | Freq: Every day | INTRAVENOUS | Status: AC | PRN
  Administered 2017-11-09: 500 [IU]
  Filled 2017-11-09: qty 5

## 2017-11-09 MED ORDER — SODIUM CHLORIDE 0.9% FLUSH
10.0000 mL | INTRAVENOUS | Status: AC | PRN
  Administered 2017-11-09: 10 mL
  Filled 2017-11-09: qty 10

## 2017-11-09 MED ORDER — FUROSEMIDE 20 MG PO TABS
ORAL_TABLET | ORAL | Status: AC
  Filled 2017-11-09: qty 1

## 2017-11-09 NOTE — Progress Notes (Signed)
Pt is experiencing increased "swelling and tenderness" in L leg, and hip. Pt states it started Monday 11/07/2017 in the evening. Pt states it feels "tight" and is not red, or warm.  Pain is worse when pt is active, pain is relieved with elevation, and when compression socks are applied.   Pt received 10mg  Lasix in infusion room pre Dr Burr Medico.

## 2017-11-09 NOTE — Progress Notes (Signed)
Myrtle Dr Ernestina Penna desk RN notified.

## 2017-11-09 NOTE — Patient Instructions (Signed)

## 2017-11-10 LAB — TYPE AND SCREEN
ABO/RH(D): A POS
Antibody Screen: NEGATIVE
Unit division: 0
Unit division: 0

## 2017-11-10 LAB — BPAM RBC
Blood Product Expiration Date: 201903162359
Blood Product Expiration Date: 201903312359
ISSUE DATE / TIME: 201903111424
ISSUE DATE / TIME: 201903131353
Unit Type and Rh: 6200
Unit Type and Rh: 6200

## 2017-11-12 NOTE — Progress Notes (Signed)
[place]  Telephone:([phone]) [phone] Fax:(336) [phone]  Clinic Follow Up Note   Patient Care Team: Carollee Herter, Alferd Apa, DO as PCP - General Pyrtle, Lajuan Lines, MD as Consulting Physician (Gastroenterology)   Date of Service:  11/14/2017  CHIEF COMPLAINTS:  Follow up metastatic intrahepatic cholangiocarcinoma  Oncology History   Metastatic cholangiocarcinoma to lung [facility])   Staging form: Perihilar Bile Ducts, AJCC 7th Edition   - Clinical stage from 07/07/2016: Stage IVB (T2b, N1, M1) - Signed by Truitt Merle, MD on 07/28/2016      Metastatic cholangiocarcinoma to lung ([place])   05/22/2014 Procedure    Routine screening colonoscopy showed a sessile polyp measuring 4 mm in the sigmoid colon, removed, otherwise negative.      06/29/2016 Imaging    CT chest, abdomen and pelvis showed 2 indeterminant nodule in left and the right lung, 4-56m, indeterminate a hypovascular liver lesions largest in the caudate lobe measuring 2.6 cm, mild abdominal lymphadenopathy in the gastrohepatic ligament and portocaval space.      07/02/2016 Imaging    Abdominal MRI with and without contrast showed 2 lobular lesions in the caudate lobe, and is regular lymph nodes in the gastrohepatic ligament which is adjacent to the liver lesion, suspicious for malignancy. 2 small lesions in the right hepatic lobe are indeterminate, but concerning for metastasis.      07/07/2016 Procedure    EGD was negative, EUS biopsy of the liver and adjacent lymph node      07/07/2016 Initial Diagnosis    [place]      07/07/2016 Initial Biopsy    Fine-needle aspiration of the liver lesion in caudate lobe and adjacent lymph nodes both showed metastatic adenocarcinoma.      08/11/2016 - 04/25/2017 Chemotherapy    Cisplatin 25 mg/m, gemcitabine 1000 mg/m, on day one and 8, every 21 days   Cisplatin and gemcitabine on Day 1, 8 every 21 days, with neulasta on day 9, Gemcitabine  dosed reduced to 800 mg/m due to cytopenias, increased to '900mg'$ /m2 on 12/06/2016. Changes to gemcitabine maintenance therapy on day 1 and 8 every 21 days, from 04/18/2017, gemcitabine was reduced dose to 400 mg/m on 04/25/17       10/18/2016 Imaging    CT CAP  IMPRESSION: 1. Again noted are multifocal liver metastasis. Overall there has been no significant interval change in overall volume of liver metastases. 2. Similar appearance of upper abdominal adenopathy 3. Slight decrease in size of small pulmonary metastases.      01/25/2017 Imaging    CT CAP W Contrast 01/25/17 IMPRESSION: 1. Response to therapy. 2. Similar to minimal decrease in pulmonary nodules/metastasis. 3. Improvement in hepatic disease burden. 4. Decrease in abdominal adenopathy. 5. Significantly age advanced atherosclerosis, including within the coronary arteries. 6. Uterine fibroids. 7. Suspect a complex cyst in the left ovary. Recommend attention on follow-up.       04/13/2017 Imaging    CT CAP W Contrast IMPRESSION: 1. The dominant caudate lobe mass is stable in size and appearance, as is the indistinct adenopathy in the porta hepatis adjacent to the caudate lobe, and in the retroperitoneum. Several subtle additional liver lesions are slightly less conspicuous on today' s exam. The pulmonary nodules are stable, and mild omental nodularity in the left lower quadrant is very slightly worsened compared to prior. Overall the burden of malignancy appears similar to prior, and some of the findings may represent effectively treated metastatic lesions. 2. Complex left  adnexal mass, 6.7 by 4.1 cm, enlarging. Pelvic sonography recommended for further characterization. 3. Other imaging findings of potential clinical significance: Aortic Atherosclerosis (ICD10-I70.0). Coronary atherosclerosis. Prominent stool throughout the colon favors constipation. Fibroid uterus.      05/16/2017 - 06/27/2017 Chemotherapy     Second-line chemo FOLFOX every 2 weeks with neulasta after pump DC. She will started with low dose FOLFOX on 05/16/17, due to her neutropenia. Held oxaliplatin with cycle two due to poor toleration. Discontinued after cycle 3 (06/27/17) due to disease progression.       07/20/2017 Imaging    IMPRESSION: CT CHEST  1. Interval development of 2 small new pulmonary nodules concerning for progressive, or new metastatic disease. Recommend continued attention on follow-up imaging. 2. The previously noted right upper and left upper lobe pulmonary nodules remain stable in size consistent with treated metastatic disease. 3. Stable position of right IJ approach power injectable port catheter. 4. Coronary artery calcifications. CT ABD/PELVIS  1. Marked interval enlargement of a complex, enhancing, cystic and solid left ovarian mass which now fills the posterior aspect of the pelvis exerting mass effect on the adjacent rectosigmoid colon and bladder. This likely accounts for the patient's lower abdominal pain and sensation of constipation (there is no significant stool burden within the rectum or sigmoid colon). These findings are highly suspicious for a primary ovarian cystic or mucinous adenocarcinoma. 2. Enlarging omental implants in the left lower quadrant highly concerning for metastatic ovarian carcinoma. 3. New/enlarging subcapsular hepatic lesion may represent progressive cholangio or ovarian carcinoma metastases. 4. Enlarging caudate lobe lesion and aortocaval/portacaval confluent necrotic adenopathy concerning for progressive metastatic cholangiocarcinoma. 5. The segment 6 hepatic lesion remains stable or may be smaller on today's examination. These results will be called to the ordering clinician or representative by the Radiologist Assistant, and communication documented in the PACS or zVision Dashboard.        07/30/2017 Imaging     CT AP W Contrast  07/30/17 IMPRESSION: 1. No substantial interval change in exam. 2. Complex cystic and solid mass filling the cul-de-sac and left adnexal space is similar to prior. There is abnormal soft tissue in the anterior left lower quadrant concerning for metastatic disease. 3. Stable appearance of the caudate lesion in this patient with known cholangiocarcinoma. There is confluent necrotic lymphadenopathy in the porta hepatis, similar to prior with substantial mass-effect on the left renal vein as it enters the IVC. 4. Fibroid change in the uterus. 5. Trace intraperitoneal free fluid.      08/05/2017 Pathology Results    Diagnosis 08/05/17  Peritoneum, biopsy, left lower abdomen METASTATIC ADENOCARCINOMA, CONSISTENT WITH CHOLANGIOCARCINOMA. Microscopic Comment      08/08/2017 Imaging    CT AP WO Contrast 08/08/17 IMPRESSION: 1. No substantial interval change in exam although assessment today limited by lack of intravenous contrast material. 2. Stomach is distended with fluid, similar to prior. 3. Patient's known caudate liver lesion not well demonstrated on today's exam. 4. Similar appearance large complex cystic and solid mass in the cul-de-sac and left adnexal space. 5. Similar appearance abnormal soft tissue in the anterior left lower quadrant. 6. Fibroid change in the uterus.      08/16/2017 -  Chemotherapy    CAPOX: Xeloda '1500mg'$  BID for 1 week on and 1 week off with low dose oxaliplatin starting 08/16/17. Stopped Xeloda 09/19/17 and start 5-FU pump.         10/07/2017 Imaging    CT CAP W CONTRAST IMPRESSION: 1. Interval slight enlargement  of the central liver lesions/cholangiocarcinoma with progressive intrahepatic biliary dilatation. 2. New pulmonary metastatic lesions and several new hepatic metastatic lesions. 3. Slight interval enlargement of the periportal adenopathy surrounding the IVC. 4. Significant interval enlargement of the large cystic and solid pelvic mass now  extending up to the level of the umbilicus 5. Slight enlargement of the peritoneal implant in the left pelvis.      10/20/2017 Procedure    ERCP per Dr. Ardis Hughs: Impression: - Mid bile duct stricture which I was unable to stent.      10/26/2017 Procedure    ERCP per Dr. Lynita Lombard at [facility] endoscopy: Placement of a covered [id number] metal stent through a common hepatic duct stricture. This was technically difficult as it was a very tight, unusually firm stricture which may be extrinsic, related to nodal metastases or direct tumor infiltration from known cholangiocarcinoma. Following stent deployment, the waist at the stricture remained very narrow. An attempt to advance a dilating balloon both before and after stent placement was not successful.   Patient was observed overnight for pain management and discharged home next day.        HISTORY OF PRESENTING ILLNESS: 07/28/16 LEffie Berkshire89y.o. female is here because of Her recently diagnosed metastatic glandular carcinoma. She is accompanied by her husband and mother to my clinic today.  She has been having RUQ abdominal pain since mid 04/2016, she was seen by PCP and was treated for gas with GI cocktail and pepcide, which she did not help much. Her pain got worse, and radiates to right shoulder, she denies significant nausea, or bloating, BM normal, no fever, cough or dyspnea. She recently noticed mild chest discomfort in the low sternum area. She initially had the lab, ultrasound done by her primary care physician, which was unrevealing. She was referred to GI Dr. PHilarie Fredricksonand underwent EGD and CT scan, which showed a lobulated mass in the caudate lobe of liver, with at adjacent adenopathy. He underwent EUS and fine-needle biopsy of the liver mass and lymph nodes, all reviewed adenocarcinoma.  She has lost about 7lbs in the past 3 months. She has mild fatigue, but able to function at home. She has stopped working due to her  abdominal pain and fatigue. She takes Norco every 1-3 times a day, and her pain seems not well controlled.  CURRENT THERAPY: supportive care   INTERIM HISTORY:  LBronwen Pendergraftreturns for follow up and is accompanied by her husband and aunt. She reports she went to the ED yesterday and was diagnosed with a DVT. She was discharged with Lovenox and instructed to take 80 mL injection daily. She states her bowel movements have been more regular lately and her appetite has been good. She has gained a few lbs lately. She states he nausea is better controlled with transderm. She states she is able to do more things around her house with improved energy.   On review of systems, pt denies nause, or any other complaints at this time. Pertinent positives are listed and detailed within the above HPI.    REVIEW OF SYSTEMS:  Constitutional: Denies fevers, chills or abnormal night sweats(+) improved appetite  (+) weight gain Eyes: Denies blurriness of vision, double vision or watery eyes Ears, nose, mouth, throat, and face: Denies mucositis or sore throat Respiratory: Denies dyspnea or wheezes Cardiovascular: Denies palpitation, chest discomfort or lower extremity swelling Gastrointestinal:  Denies heartburn or change in bowel habits (+) nausea improved controlled with  transderm(+) abdominal  pain, pain now 2/10 with IV dilaudid.  Skin: Denies abnormal skin rashes  Lymphatics: Denies new lymphadenopathy or easy bruising Neurological:Denies numbness, tingling or new weaknesses (+) "chemo brain" (+)  distibular migraine Behavioral/Psych:  (+) anxiety  Musculoskeletal: (+) back pain  All other systems were reviewed with the patient and are negative.   MEDICAL HISTORY:  Past Medical History:  Diagnosis Date  . Allergy    SEASONAL  . Anemia    [employer]CHOOL  . Anxiety   . Atopic eczema   . cholangio ca dx'd 07/12/16  . Depression   . Family history of adverse reaction to anesthesia    Mother has  allergic reactions stops breathing  . Fibrocystic breast disease   . GERD (gastroesophageal reflux disease)   . Hx of migraines    seasonal   . Personal history of chemotherapy   . Pneumonia    2012  . TIA (transient ischemic attack)    Questionable migraine    SURGICAL HISTORY: Past Surgical History:  Procedure Laterality Date  . ENDOSCOPIC RETROGRADE CHOLANGIOPANCREATOGRAPHY (ERCP) WITH PROPOFOL N/A 10/20/2017   Procedure: ENDOSCOPIC RETROGRADE CHOLANGIOPANCREATOGRAPHY (ERCP) WITH PROPOFOL;  Surgeon: [name], MD;  Location: [facility] ENDOSCOPY;  Service: Endoscopy;  Laterality: N/A;  . EUS N/A 07/07/2016   Procedure: UPPER ENDOSCOPIC ULTRASOUND (EUS) RADIAL;  Surgeon: [name], MD;  Location: [facility] ENDOSCOPY;  Service: Endoscopy;  Laterality: N/A;  . IR GENERIC HISTORICAL  08/03/2016   IR US GUIDE VASC ACCESS RIGHT 08/03/2016 Corrie Mckusick, DO [facility]-INTER[facility]  . IR GENERIC HISTORICAL  08/03/2016   IR FLUORO GUIDE PORT INSERTION RIGHT 08/03/2016 Corrie Mckusick, DO [facility]  . MYOMECTOMY  2002    SOCIAL HISTORY: Social History   Socioeconomic History  . Marital status: Married    Spouse name: Not on file  . Number of children: 0  . Years of education: 66  . Highest education level: Not on file  Social Needs  . Financial resource strain: Not on file  . Food insecurity - worry: Not on file  . Food insecurity - inability: Not on file  . Transportation needs - medical: Not on file  . Transportation needs - non-medical: Not on file  Occupational History  . Occupation: HR fmla    Employer: [employer]    Employer: [employer]  Tobacco Use  . Smoking status: Never Smoker  . Smokeless tobacco: Never Used  Substance and Sexual Activity  . Alcohol use: No    Frequency: Never  . Drug use: No  . Sexual activity: Yes    Partners: Male  Other Topics Concern  . Not on file  Social History Narrative  . Not on file    FAMILY HISTORY: Family History  Problem Relation Age of  Onset  . Gout Mother   . Diabetes Mother   . Hypertension Father   . Diabetes Father   . Breast cancer Paternal Aunt   . Cancer Maternal Grandfather        throat cancer   . Cancer Cousin 75       GI cancer   . Thyroid disease Neg Hx     ALLERGIES:  is allergic to penicillins; contrast media [iodinated diagnostic agents]; and lipitor [atorvastatin].  MEDICATIONS:  Current Outpatient Medications  Medication Sig Dispense Refill  . ALPRAZolam (XANAX) 0.5 MG tablet Take 1 tablet (0.5 mg total) by mouth 3 (three) times daily as needed for anxiety. 30 tablet 0  . cetirizine (ZYRTEC) 10  MG tablet Take 10 mg by mouth daily as needed for allergies. For allergies     . Cholecalciferol (VITAMIN D3) 5000 UNITS CAPS Take 5,000 Units by mouth daily.     . clotrimazole (MYCELEX) 10 MG troche Take 1 tablet (10 mg total) by mouth 5 (five) times daily. 35 tablet 0  . COLOSTRUM PO Take 1.25 g by mouth 2 (two) times daily.     Marland Kitchen dicyclomine (BENTYL) 20 MG tablet Take 20 mg by mouth 3 (three) times daily as needed for spasms.     Marland Kitchen dronabinol (MARINOL) 5 MG capsule Take 1 capsule (5 mg total) by mouth 2 (two) times daily before a meal. 60 capsule 1  . fluconazole (DIFLUCAN) 100 MG tablet Take 1 tablet (100 mg total) by mouth daily. 7 tablet 0  . lidocaine-prilocaine (EMLA) cream Apply 1 application topically as needed. Apply to portacath 1 1/2 hours - 2 hours prior to procedures as needed. 30 g 1  . LORazepam (ATIVAN) 0.5 MG tablet Place under tongue and dissolve every 8 hours as needed for nausea. Do not take Xanax while taking this medication. 30 tablet 0  . megestrol (MEGACE) 400 MG/10ML suspension Take 10 mLs (400 mg total) by mouth daily. 240 mL 3  . Menaquinone-7 (VITAMIN K2) 40 MCG TABS Take 40 mg by mouth daily.     . mometasone (ELOCON) 0.1 % lotion Apply topically daily. (Patient taking differently: Apply 1 application topically daily as needed. ) 60 mL 0  . omeprazole (PRILOSEC) 40 MG capsule  Take 1 capsule (40 mg total) by mouth 2 (two) times daily before a meal. 60 capsule 2  . ondansetron (ZOFRAN) 8 MG tablet Take 1 tablet (8 mg total) by mouth every 8 (eight) hours as needed for nausea or vomiting. 30 tablet 3  . oxyCODONE (OXYCONTIN) 40 mg 12 hr tablet Take 1 tablet (40 mg total) by mouth every 12 (twelve) hours. 60 tablet 0  . Oxycodone HCl 10 MG TABS Take 1 tablet (10 mg total) by mouth every 3 (three) hours as needed. 100 tablet 0  . polyethylene glycol (MIRALAX / GLYCOLAX) packet Take 17 g by mouth 2 (two) times daily. Hold if you develop diarrhea (Patient taking differently: Take 17 g by mouth daily as needed for mild constipation. Hold if you develop diarrhea) 100 each 0  . prenatal vitamin w/FE, FA (PRENATAL 1 + 1) 27-1 MG TABS tablet Take 1 tablet by mouth daily at 12 noon.    . promethazine (PHENERGAN) 25 MG tablet Take 1 tablet (25 mg total) by mouth every 6 (six) hours as needed for nausea or vomiting. 40 tablet 0  . scopolamine (TRANSDERM-SCOP) 1 MG/3DAYS Place 1 patch (1.5 mg total) onto the skin every 3 (three) days. (Patient taking differently: Place 1 patch onto the skin See admin instructions. Every 72 hours as needed) 10 patch 2  . senna (SENOKOT) 8.6 MG TABS tablet Take 2 tablets (17.2 mg total) by mouth 2 (two) times daily. (Patient taking differently: Take 2 tablets by mouth 2 (two) times daily as needed for mild constipation. ) 120 each 0  . vitamin C (ASCORBIC ACID) 500 MG tablet Take 500 mg by mouth daily.    Marland Kitchen enoxaparin (LOVENOX) 80 MG/0.8ML injection Inject 0.8 mLs (80 mg total) into the skin daily. 30 Syringe 2   No current facility-administered medications for this visit.    Facility-Administered Medications Ordered in Other Visits  Medication Dose Route Frequency Provider Last Rate Last  Dose  . alteplase (CATHFLO ACTIVASE) injection 2 mg  2 mg Intracatheter Once PRN Truitt Merle, MD      . heparin lock flush 100 unit/mL  250 Units Intracatheter Once PRN  Truitt Merle, MD      . ondansetron [facility]) injection 8 mg  8 mg Intravenous Once Truitt Merle, MD      . sodium chloride flush (NS) 0.9 % injection 10 mL  10 mL Intracatheter PRN Truitt Merle, MD   10 mL at 09/09/16 1645  . sodium chloride flush (NS) 0.9 % injection 10 mL  10 mL Intravenous PRN Truitt Merle, MD   10 mL at 02/11/17 1515   PHYSICAL EXAMINATION:  ECOG PERFORMANCE STATUS: 2-3 Vitals:   11/14/17 1125  BP: 123/83  Pulse: 96  Resp: 18  Temp: 98.4 F (36.9 C)  TempSrc: Oral  SpO2: 100%  Weight: 123 lb (55.8 kg)  Height: '5\' 6"'$  (1.676 m)    GENERAL:alert, no distress and comfortable SKIN: skin color, texture, turgor are normal, no rashes or significant lesions, several hematomas on extremities  EYES: normal, conjunctiva are pink and non-injected, sclera clear OROPHARYNX:no exudate, no erythema and lips, buccal mucosa, oral thrush resolved  NECK: supple, thyroid normal size, non-tender, without nodularity LYMPH:  no palpable lymphadenopathy in the cervical, axillary or inguinal LUNGS: clear to auscultation and percussion with normal breathing effort HEART: regular rate & rhythm and no murmurs and no lower extremity edema ABDOMEN: abdomen soft, non-tender and active bowel sounds, no hepatomegaly (+) mild tenderness on the right mid-abdomen with a large abdominal mass in that area. No organomegaly. Musculoskeletal:no cyanosis of digits and no clubbing  PSYCH: alert & oriented x 3 with fluent speech NEURO: no focal motor/sensory deficits  LABORATORY DATA:  I have reviewed the data as listed CBC Latest Ref Rng & Units 11/14/2017 11/13/2017 11/07/2017  WBC 3.9 - 10.3 K/uL 6.3 6.6 4.5  Hemoglobin 12.0 - 15.0 g/dL - 10.1(L) -  Hematocrit 34.8 - 46.6 % 30.9(L) 30.7(L) 22.5(L)  Platelets 145 - 400 K/uL 230 240 295   CMP Latest Ref Rng & Units 11/14/2017 11/13/2017 11/07/2017  Glucose 70 - 140 mg/dL 93 110(H) 106  BUN 7 - 26 mg/dL '14 15 16  '$ Creatinine 0.60 - 1.10 mg/dL 0.98 1.16(H) 1.33(H)   Sodium 136 - 145 mmol/L 133(L) 132(L) 131(L)  Potassium 3.5 - 5.1 mmol/L 4.4 3.9 4.0  Chloride 98 - 109 mmol/L 98 98(L) 95(L)  CO2 22 - 29 mmol/L '26 24 27  '$ Calcium 8.4 - 10.4 mg/dL 9.2 8.7(L) 9.6  Total Protein 6.4 - 8.3 g/dL 6.4 - 6.6  Total Bilirubin 0.2 - 1.2 mg/dL 1.9(H) - 2.7(H)  Alkaline Phos 40 - 150 U/L 53 - 68  AST 5 - 34 U/L 23 - 24  ALT 0 - 55 U/L 16 - 29    CA19.9 (0-35U/ml) 07/28/2016: 36 08/18/2016: 51 09/13/2016: 56 10/19/2016: 43 11/09/2016: 44 12/06/2016: 41 01/05/2017: 35 02/02/2017: 37 03/07/17: 43 04/18/17: 66 05/16/17: 111 06/08/17: 119 06/18/17: 107 07/18/17: 161 08/16/17: 272 09/19/17: 669 10/17/17: 1,826 11/14/17: PENDING  CA 125 07/25/17: 100.6    PATHOLOGY    Diagnosis 08/05/17  Peritoneum, biopsy, left lower abdomen METASTATIC ADENOCARCINOMA, CONSISTENT WITH CHOLANGIOCARCINOMA. Microscopic Comment The adenocarcinoma stains positive for ck7, negative for ER, GATA 3, ck20, cdx2, and TTF-1. Pax 8 (patchy stain). The morphology and immunostain pattern support the above diagnosis. The block B is sent to [facility] for further molecular testing.   Diagnosis 07/07/2016 FINE NEEDLE  ASPIRATION, ENDOSCOPIC, LIVER (SPECIMEN 2 OF 2 COLLECTED 07/07/16): MALIGNANT CELLS CONSISTENT WITH ADENOCARCINOMA.  ADDITIONAL INFORMATION: Per request, immunohistochemistry was performed. The cells are positive for cytokeratin 7 and negative for cytokeratin 20 and CDX-2. This excludes a colorectal primary. Pancreaticobiliary and upper GI sources remain in the differential. Called to Dr. Burr Medico on 07/28/2016.  Diagnosis 07/07/2016 FINE NEEDLE ASPIRATION, ENDOSCOPIC, GASTRO-HEPATIC LYMPH NODE (SPECIMEN 1 OF 2 COLLECTED 07/07/16): MALIGNANT CELLS CONSISTENT WITH ADENOCARCINOMA, SEE COMMENT. Preliminary Diagnosis Intraoperative Diagnosis: Few atypical glandular clusters - recommend additional material. 4) Adequate ([facility])   PROCEDURES  Upper EUS 07/07/2016 Dr. Ardis Hughs  -  Endoscopically normal UGI tract (good views with standard gastroscope) - Vaguely bordered 4cm mass in the caudate lobe of liver that is confluent with what appear to be enlarged gastrohepatic lymphnodes (these may represent direct tumor extension however). Preliminary cytology from the lymphnodes shows malignancy, glandular. Preliminary cytology from the liver mass shows the same. Await final cytology results but this is most suspicious for peripheral intrahepatic cholangiocarcinoma with adjacent malignant adenopathy.  COLONOSCOPY 05/22/2014 ENDOSCOPIC IMPRESSION: 1. Sessile polyp measuring 4 mm in size was found in the sigmoid colon; polypectomy was performed with a cold snare 2. The colon mucosa was otherwise normal    RADIOGRAPHIC STUDIES: I have personally reviewed the radiological images as listed and agreed with the findings in the report.  CT AP W Contrast 10/17/17 IMPRESSION: 1. Interval slight enlargement of the central liver lesions/cholangiocarcinoma with progressive intrahepatic biliary dilatation. 2. New pulmonary metastatic lesions and several new hepatic metastatic lesions. 3. Slight interval enlargement of the periportal adenopathy surrounding the IVC. 4. Significant interval enlargement of the large cystic and solid pelvic mass now extending up to the level of the umbilicus 5. Slight enlargement of the peritoneal implant in the left pelvis.  CT AP WO Contrast 08/08/17 IMPRESSION: 1. No substantial interval change in exam although assessment today limited by lack of intravenous contrast material. 2. Stomach is distended with fluid, similar to prior. 3. Patient's known caudate liver lesion not well demonstrated on today's exam. 4. Similar appearance large complex cystic and solid mass in the cul-de-sac and left adnexal space. 5. Similar appearance abnormal soft tissue in the anterior left lower quadrant. 6. Fibroid change in the uterus.  CT AP W Contrast  07/30/17 IMPRESSION: 1. No substantial interval change in exam. 2. Complex cystic and solid mass filling the cul-de-sac and left adnexal space is similar to prior. There is abnormal soft tissue in the anterior left lower quadrant concerning for metastatic disease. 3. Stable appearance of the caudate lesion in this patient with known cholangiocarcinoma. There is confluent necrotic lymphadenopathy in the porta hepatis, similar to prior with substantial mass-effect on the left renal vein as it enters the IVC. 4. Fibroid change in the uterus. 5. Trace intraperitoneal free fluid.  CT CAP W Contrast 07/21/17 IMPRESSION: CT CHEST 1. Interval development of 2 small new pulmonary nodules concerning for progressive, or new metastatic disease. Recommend continued attention on follow-up imaging. 2. The previously noted right upper and left upper lobe pulmonary nodules remain stable in size consistent with treated metastatic disease. 3. Stable position of right IJ approach power injectable port catheter. 4. Coronary artery calcifications. CT ABD/PELVIS 1. Marked interval enlargement of a complex, enhancing, cystic and solid left ovarian mass which now fills the posterior aspect of the pelvis exerting mass effect on the adjacent rectosigmoid colon and bladder. This likely accounts for the patient's lower abdominal pain and sensation of constipation (there is  no significant stool burden within the rectum or sigmoid colon). These findings are highly suspicious for a primary ovarian cystic or mucinous adenocarcinoma. 2. Enlarging omental implants in the left lower quadrant highly concerning for metastatic ovarian carcinoma. 3. New/enlarging subcapsular hepatic lesion may represent progressive cholangio or ovarian carcinoma metastases. 4. Enlarging caudate lobe lesion and aortocaval/portacaval confluent necrotic adenopathy concerning for progressive metastatic cholangiocarcinoma. 5. The segment 6 hepatic  lesion remains stable or may be smaller on today's examination. These results will be called to the ordering clinician or representative by the Radiologist Assistant, and communication documented in the PACS or zVision Dashboard.   CT CAP W Contrast, 04/13/17 IMPRESSION: 1. The dominant caudate lobe mass is stable in size and appearance, as is the indistinct adenopathy in the porta hepatis adjacent to the caudate lobe, and in the retroperitoneum. Several subtle additional liver lesions are slightly less conspicuous on today' s exam. The pulmonary nodules are stable, and mild omental nodularity in the left lower quadrant is very slightly worsened compared to prior. Overall the burden of malignancy appears similar to prior, and some of the findings may represent effectively treated metastatic lesions. 2. Complex left adnexal mass, 6.7 by 4.1 cm, enlarging. Pelvic sonography recommended for further characterization. 3. Other imaging findings of potential clinical significance: Aortic Atherosclerosis (ICD10-I70.0). Coronary atherosclerosis. Prominent stool throughout the colon favors constipation. Fibroid uterus.  CT CAP W Contrast 01/25/17 IMPRESSION: 1. Response to therapy. 2. Similar to minimal decrease in pulmonary nodules/metastasis. 3. Improvement in hepatic disease burden. 4. Decrease in abdominal adenopathy. 5. Significantly age advanced atherosclerosis, including within the coronary arteries. 6. Uterine fibroids. 7. Suspect a complex cyst in the left ovary. Recommend attention on follow-up.  CT CAP W CONTRAST 10/18/16 IMPRESSION: 1. Again noted are multifocal liver metastasis. Overall there has been no significant interval change in overall volume of liver metastases. 2. Similar appearance of upper abdominal adenopathy 3. Slight decrease in size of small pulmonary metastases.   ASSESSMENT & PLAN:  52 y.o. African-American female, without significant past medical history, presented with  abdominal pain  1. 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 -We have previously reviewed her case in our GI tumor board. Based on the scans and endoscopy findings, this is most consistent with cholangiocarcinoma. Her liver and inguinal biopsy showed adenocarcinoma, IHC was consistent with pancreatic-biliary primary -She has 2 additional small liver lesions and 2 4-65m lung lesions, which are indeterminate but that is suspicious for metastatic disease. -She was seen by surgeon Dr. BBarry Dieneswho thinks she is a not a candidate for surgical resection due to her metastatic disease. -We previously discussed her cancer is incurable at this stage, the goal of therapy is palliation, to prolong life and improve her quality life -she responded well to first-line cisplatin and gemcitabine, on day 1, 8 every 21 days.  She developed significant cytopenia after 8-9 months therapy and had to stop --She started second line FOLFOX on 05/16/17. Held oxaliplatin with cycle two due to poor toleration.  -Her CT CAP from 07/21/17 showed new peritoneal carcinomatosis with a large complex ovarian mass, primary ovarian cancer vs metastatic cholangiocarcinoma. I recommend IR biopsy of her peritoneal lesion, checked CA125 and it was 100.6 on 07/25/17.  -Her IR biopsy of LLQ omental implant from 08/05/17 showed metastatic disease consistent with cholangiocarcinoma. I have discussed with Dr.  Morse at [facility] for consideration of other options or clinical trial. 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  Unfortunately she is not a candidate for immunotherapy based on the MSI, there was one rearrangement in  intron is unlikely to predict response to TKI such as crizotinib. She also has ERBB2 amplification, but HER2 FISH was negative, she will unlikely benefit from HER2 antibody such as Herceptin. -She completed 3 cycles of low-dose Capox, her overall condition improved, however she had moderate side effect from Xeloda, her total bilirubin also slightly increased, chemo changed back to FOLFOX -She had persistent fatigue, nausea, vomiting, and abdominal cramps, and constipation after cycle 1. Treatment was held on 10/03/17 due to side effects and labs; she had an elevated creatinine Cr 1.44 and hyperbilirubinemia bili 2.0.  -On 10/17/17, her creatinine Cr 1.59 and hyperbilirubinemia bili 6.1. I gave her IV fluids and held chemo.  -I previously reviewed the CT myself and spoke with the radiologist. I discussed the results with the pt and her family. I will speak with gastroenterology Dr. Ardis Hughs about possibly putting a stent in the common bile duct to lower her bilirubin. If this procedure is not feasible, there is another procedure to drain the bile through IR.  -She underwent CBD stent placement at [facility] on 10/26/17, tolerated procedure well. She is recovering slowly. She still quite fatigued, appetite has improved some. Her hyperbilirubinemia has improved with tbil 1.9 today.   -I previously discussed with Dr. Leamon Arnt at [facility]. If her hyperbilirubinemia able to resolve, and her performance status improves, I would recommend her to consider clinical trial at [facility] once she is recovered enough to travel.   -I do not think she can travel for now. Given her overall condition has improved some lately, I will refer her to [facility] in [place] and discuss her clinical options with them. I will refer today -Due to her limited performance status, and limited chemotherapy options, I would not offer her more chemotherapy at this point. -Labs today, her Hgb is 10.0 after previous  transfusion 4 days ago, I will add iron studies on today -F/u in 2 weeks   2. Obstructive jaundice  -Her total bilirubin has been trending up lately, 6.0 today -This is likely secondary to the obstruction from her cranial carcinoma, periportal adenopathy -She underwent CBD stent placement at [facility] on 10/26/17 -bilirubin is trending down  3. Abdominal pain, back pain  -She has had worsening abdominal pain lately due to cancer progression, now much better controlled  -Continue OxyContin 60 mg every 12 hours, and oxycodone 10 mg every 3 hours as needed for pain  -She also has small amount of Dilaudid at home, she will use it as needed for severe pain  -abdominal pain exacerbated by treatment, previously post poned treatment 1 week to stabilize her pain.  -Will continue 60 mg of OxyContin every 12 hrs, and oxycodone as needed, her abdominal pain has been well controlled lately.   4. Nausea and anorexia and weight loss -We previously reviewed her nausea management, she will continue Compazine and Zofran -I previously encouraged her to take a nutritional supplement -We previously discussed appetite stimulant, she is on Marinol, dose was increased to 5 mg twice daily last week, appetite improved, will continue. -she will f/u with dietician  -She has tried Marinol, now on Megace,  appetite has improved since her pain is controlled -Continue Megace -Nausea controlled with scopolamine patch, PPI, and zofran -Refilled Megace today (11/14/17)  5. Goal of care discussion  -We previously discussed the incurable nature of her cancer, and the overall poor prognosis, especially she has had a major disease progression lately, and she has had several minor treatment.  Her prognosis is guarded -The patient understands the goal of care is palliative. -I have previously recommended DNR/DNI, she has not decided yet. Will address with her on next visit again   6. Constipation -I previously discussed this  can worsen with increase in pain medication   -I previously suggested she can take 4-6 tablets of seneca a day. If not enough she can take half bottle magnesium citrate until she has a BM. She can take seneca at least 2 tablets daily to make sure she has regular BM -She will continue senokot with or without miralax daily to maintain BM q1-2 days. She will take mag citrate if no BM in 4 days  7. Peripheral Neuropathy, grade 1 -to her feet, likely secondary to chemotherapy.  8. Anxiety -she takes Xanax about 3 times a week now. Refilled on 10/17/17  9. Leg swelling and Acute DVT in LLE 11/13/17 -She saw Lucianne Lei at symptom management clinic for ankle swelling last week and was improving with compression stockings -She states she felt a tightness yesterday only in her LLE and she went to the ED where they found a DVT -She was discharged with Lovenox and instructed to take 80 ml daily.  -I discussed oral anticoagulate options, I discussed than Lovenox injections are more effective in cancer patients. She can switch to oral in a few months if she would like. She is agreeable to continue with Lovenox injections today, I refilled  -She can continue to wear compression stocking on right leg.    Plan -Refilled 80 mg Lovenox daily today -Refilled Megace today  -IVF fluids today for 750cc  -Referral to [facility]  -F/u in 2 weeks    All questions were answered. The patient knows to call the clinic with any problems, questions or concerns.  I spent 30 minutes counseling the patient face to face. The total time spent in the appointment was 35 minutes and more than 50% was on counseling.  This document serves as a record of services personally performed by Truitt Merle, MD. It was created on her behalf by Theresia Bough, a trained medical scribe. The creation of this record is based on the scribe's personal observations and the provider's statements to them.   I have reviewed the above  documentation for accuracy and completeness, and I agree with the above.    Truitt Merle, MD 11/14/2017 12:58 PM

## 2017-11-13 ENCOUNTER — Other Ambulatory Visit: Payer: Self-pay

## 2017-11-13 ENCOUNTER — Emergency Department (HOSPITAL_BASED_OUTPATIENT_CLINIC_OR_DEPARTMENT_OTHER)
Admission: EM | Admit: 2017-11-13 | Discharge: 2017-11-13 | Disposition: A | Discharge status: Home / Self Care | Payer: Federal, State, Local not specified - PPO | Attending: Emergency Medicine | Admitting: Emergency Medicine

## 2017-11-13 ENCOUNTER — Emergency Department (HOSPITAL_BASED_OUTPATIENT_CLINIC_OR_DEPARTMENT_OTHER): Payer: Federal, State, Local not specified - PPO

## 2017-11-13 ENCOUNTER — Encounter (HOSPITAL_BASED_OUTPATIENT_CLINIC_OR_DEPARTMENT_OTHER): Payer: Self-pay | Admitting: Emergency Medicine

## 2017-11-13 DIAGNOSIS — Z8673 Personal history of transient ischemic attack (TIA), and cerebral infarction without residual deficits: Secondary | ICD-10-CM | POA: Insufficient documentation

## 2017-11-13 DIAGNOSIS — I82402 Acute embolism and thrombosis of unspecified deep veins of left lower extremity: Secondary | ICD-10-CM | POA: Diagnosis not present

## 2017-11-13 DIAGNOSIS — R2242 Localized swelling, mass and lump, left lower limb: Secondary | ICD-10-CM | POA: Diagnosis present

## 2017-11-13 DIAGNOSIS — Z8505 Personal history of malignant neoplasm of liver: Secondary | ICD-10-CM | POA: Diagnosis not present

## 2017-11-13 DIAGNOSIS — Z79899 Other long term (current) drug therapy: Secondary | ICD-10-CM | POA: Diagnosis not present

## 2017-11-13 LAB — BASIC METABOLIC PANEL
Anion gap: 10 (ref 5–15)
BUN: 15 mg/dL (ref 6–20)
CO2: 24 mmol/L (ref 22–32)
Calcium: 8.7 mg/dL — ABNORMAL LOW (ref 8.9–10.3)
Chloride: 98 mmol/L — ABNORMAL LOW (ref 101–111)
Creatinine, Ser: 1.16 mg/dL — ABNORMAL HIGH (ref 0.44–1.00)
GFR calc Af Amer: >60 mL/min (ref >60)
GFR calc non Af Amer: 54 mL/min — ABNORMAL LOW (ref >60)
Glucose, Bld: 110 mg/dL — ABNORMAL HIGH (ref 65–99)
Potassium: 3.9 mmol/L (ref 3.5–5.1)
Sodium: 132 mmol/L — ABNORMAL LOW (ref 135–145)

## 2017-11-13 LAB — CBC WITH DIFFERENTIAL/PLATELET
Basophils Absolute: 0 10*3/uL (ref 0.0–0.1)
Basophils Relative: 0 %
Eosinophils Absolute: 0 10*3/uL (ref 0.0–0.7)
Eosinophils Relative: 0 %
HCT: 30.7 % — ABNORMAL LOW (ref 36.0–46.0)
Hemoglobin: 10.1 g/dL — ABNORMAL LOW (ref 12.0–15.0)
Lymphocytes Relative: 16 %
Lymphs Abs: 1.1 10*3/uL (ref 0.7–4.0)
MCH: 28.2 pg (ref 26.0–34.0)
MCHC: 32.9 g/dL (ref 30.0–36.0)
MCV: 85.8 fL (ref 78.0–100.0)
Monocytes Absolute: 0.5 10*3/uL (ref 0.1–1.0)
Monocytes Relative: 8 %
Neutro Abs: 4.9 10*3/uL (ref 1.7–7.7)
Neutrophils Relative %: 76 %
Platelets: 240 10*3/uL (ref 150–400)
RBC: 3.58 MIL/uL — ABNORMAL LOW (ref 3.87–5.11)
RDW: 17.1 % — ABNORMAL HIGH (ref 11.5–15.5)
WBC: 6.6 10*3/uL (ref 4.0–10.5)

## 2017-11-13 MED ORDER — ENOXAPARIN (LOVENOX) PATIENT EDUCATION KIT
PACK | Freq: Once | Status: DC
  Filled 2017-11-13: qty 1

## 2017-11-13 MED ORDER — HEPARIN SOD (PORK) LOCK FLUSH 100 UNIT/ML IV SOLN
INTRAVENOUS | Status: AC
  Filled 2017-11-13: qty 5

## 2017-11-13 MED ORDER — ENOXAPARIN SODIUM 80 MG/0.8ML ~~LOC~~ SOLN
1.5000 mg/kg | Freq: Once | SUBCUTANEOUS | Status: AC
  Administered 2017-11-13: 80 mg via SUBCUTANEOUS
  Filled 2017-11-13: qty 0.8

## 2017-11-13 MED ORDER — ENOXAPARIN SODIUM 40 MG/0.4ML ~~LOC~~ SOLN: 1.5000 mg/kg | SUBCUTANEOUS | 0 refills | Status: DC

## 2017-11-13 NOTE — ED Provider Notes (Signed)
[facility] EMERGENCY DEPARTMENT Provider Note   CSN: [id number] Arrival date & time: 11/13/17  0827     History   Chief Complaint Chief Complaint  Patient presents with  . Leg Swelling    HPI Nancy [name] is a 52 y.o. female.  HPI  Patient with a history of metastatic cholangiocarcinoma presenting with left lower extremity swelling.  She states that over the past week or so she has had worsening swelling.  She spoke with the cancer center and at that time it sounds that both legs were swollen.  It was recommended that she elevate her legs and wear compression stockings.  Over the past several days the swelling has been limited to only the left lower extremity.  She feels the swelling is worsening.  She has some mild discomfort in her calf.  No chest pain or difficulty breathing.  No fevers or redness of the leg.  Had no injury.  There are no other associated systemic symptoms, there are no other alleviating or modifying factors.   Past Medical History:  Diagnosis Date  . Allergy    SEASONAL  . Anemia    HIGH SCHOOL  . Anxiety   . Atopic eczema   . cholangio ca dx'd 07/12/16  . Depression   . Family history of adverse reaction to anesthesia    Mother has allergic reactions stops breathing  . Fibrocystic breast disease   . GERD (gastroesophageal reflux disease)   . Hx of migraines    seasonal   . Personal history of chemotherapy   . Pneumonia    2012  . TIA (transient ischemic attack)    Questionable migraine    Patient Active Problem List   Diagnosis Date Noted  . Bile duct obstruction   . Moderate protein-calorie malnutrition ([place])   . Nausea & vomiting 08/09/2017  . Abdominal pain 08/09/2017  . Nausea and vomiting 08/09/2017  . Hypokalemia   . Acute neoplasm-related pain 07/29/2017  . Abdominal pain, acute, left upper quadrant 07/29/2017  . Dehydration 07/29/2017  . Oral candidiasis 07/19/2017  . Neoplasm related pain 07/19/2017  . Anemia due  to antineoplastic chemotherapy 02/13/2017  . Goals of care, counseling/discussion 10/04/2016  . Cerumen impaction 09/16/2016  . Abnormal finding on GI tract imaging   . Metastatic cholangiocarcinoma to lung ([place])   . Acute bronchitis 10/01/2015  . TIA (transient ischemic attack) 06/12/2015  . Hyperlipidemia LDL goal <70 06/12/2015  . Diplopia 06/09/2015  . Vertigo 06/09/2015  . Right leg pain 04/12/2014  . Hyperthyroidism 03/13/2014  . MAMMOGRAM, ABNORMAL 10/31/2008  . ECZEMA, ATOPIC 10/15/2008  . FIBROCYSTIC BREAST DISEASE, HX OF 10/15/2008    Past Surgical History:  Procedure Laterality Date  . ENDOSCOPIC RETROGRADE CHOLANGIOPANCREATOGRAPHY (ERCP) WITH PROPOFOL N/A 10/20/2017   Procedure: ENDOSCOPIC RETROGRADE CHOLANGIOPANCREATOGRAPHY (ERCP) WITH PROPOFOL;  Surgeon: [name], MD;  Location: [facility] ENDOSCOPY;  Service: Endoscopy;  Laterality: N/A;  . EUS N/A 07/07/2016   Procedure: UPPER ENDOSCOPIC ULTRASOUND (EUS) RADIAL;  Surgeon: [name], MD;  Location: [facility] ENDOSCOPY;  Service: Endoscopy;  Laterality: N/A;  . IR GENERIC HISTORICAL  08/03/2016   IR US GUIDE VASC ACCESS RIGHT 08/03/2016 Corrie Mckusick, DO [facility]INTER[facility]  . IR GENERIC HISTORICAL  08/03/2016   IR FLUORO GUIDE PORT INSERTION RIGHT 08/03/2016 Corrie Mckusick, DO [facility]  . MYOMECTOMY  2002    OB History    No data available       Home Medications    Prior to  Admission medications   Medication Sig Start Date End Date Taking? Authorizing Provider  ALPRAZolam Duanne Moron) 0.5 MG tablet Take 1 tablet (0.5 mg total) by mouth 3 (three) times daily as needed for anxiety. 10/17/17   Truitt Merle, MD  cetirizine (ZYRTEC) 10 MG tablet Take 10 mg by mouth daily as needed for allergies. For allergies     [provider]  Cholecalciferol (VITAMIN D3) 5000 UNITS CAPS Take 5,000 Units by mouth daily.     [provider]  clotrimazole (MYCELEX) 10 MG troche Take 1 tablet (10 mg total) by mouth 5 (five) times  daily. 11/07/17   Alla Feeling, NP  COLOSTRUM PO Take 1.25 g by mouth 2 (two) times daily.     [provider]  dicyclomine (BENTYL) 20 MG tablet Take 20 mg by mouth 3 (three) times daily as needed for spasms.  07/22/16   [provider]  dronabinol (MARINOL) 5 MG capsule Take 1 capsule (5 mg total) by mouth 2 (two) times daily before a meal. 10/31/17   Truitt Merle, MD  enoxaparin (LOVENOX) 40 MG/0.4ML injection Inject 0.8 mLs (80 mg total) into the skin daily. 11/13/17   Lilliemae Fruge, Forbes Cellar, MD  fluconazole (DIFLUCAN) 100 MG tablet Take 1 tablet (100 mg total) by mouth daily. 10/31/17   Alla Feeling, NP  lidocaine-prilocaine (EMLA) cream Apply 1 application topically as needed. Apply to portacath 1 1/2 hours - 2 hours prior to procedures as needed. 08/12/17   Truitt Merle, MD  LORazepam (ATIVAN) 0.5 MG tablet Place under tongue and dissolve every 8 hours as needed for nausea. Do not take Xanax while taking this medication. 10/03/17   Alla Feeling, NP  megestrol (MEGACE) 400 MG/10ML suspension Take 10 mLs (400 mg total) by mouth daily. 08/02/17   Eugenie Filler, MD  Menaquinone-7 (VITAMIN K2) 40 MCG TABS Take 40 mg by mouth daily.     [provider]  mometasone (ELOCON) 0.1 % lotion Apply topically daily. Patient taking differently: Apply 1 application topically daily as needed.  08/21/15   [name], DO  omeprazole (PRILOSEC) 40 MG capsule Take 1 capsule (40 mg total) by mouth 2 (two) times daily before a meal. 08/01/17   Eugenie Filler, MD  ondansetron (ZOFRAN) 8 MG tablet Take 1 tablet (8 mg total) by mouth every 8 (eight) hours as needed for nausea or vomiting. 10/31/17   Alla Feeling, NP  oxyCODONE (OXYCONTIN) 40 mg 12 hr tablet Take 1 tablet (40 mg total) by mouth every 12 (twelve) hours. 11/07/17   Alla Feeling, NP  Oxycodone HCl 10 MG TABS Take 1 tablet (10 mg total) by mouth every 3 (three) hours as needed. 08/24/17   Tanner, Lyndon Code., PA-C  polyethylene  glycol (MIRALAX / GLYCOLAX) packet Take 17 g by mouth 2 (two) times daily. Hold if you develop diarrhea Patient taking differently: Take 17 g by mouth daily as needed for mild constipation. Hold if you develop diarrhea 08/01/17   Eugenie Filler, MD  prenatal vitamin w/FE, FA (PRENATAL 1 + 1) 27-1 MG TABS tablet Take 1 tablet by mouth daily at 12 noon.    [provider]  promethazine (PHENERGAN) 25 MG tablet Take 1 tablet (25 mg total) by mouth every 6 (six) hours as needed for nausea or vomiting. 05/18/17   Tanner, Lyndon Code., PA-C  scopolamine (TRANSDERM-SCOP) 1 MG/3DAYS Place 1 patch (1.5 mg total) onto the skin every 3 (three) days.  Patient taking differently: Place 1 patch onto the skin See admin instructions. Every 72 hours as needed 10/10/17   Tanner, Lyndon Code., PA-C  senna (SENOKOT) 8.6 MG TABS tablet Take 2 tablets (17.2 mg total) by mouth 2 (two) times daily. Patient taking differently: Take 2 tablets by mouth 2 (two) times daily as needed for mild constipation.  08/01/17   Eugenie Filler, MD  vitamin C (ASCORBIC ACID) 500 MG tablet Take 500 mg by mouth daily.    [provider]    Family History Family History  Problem Relation Age of Onset  . Gout Mother   . Diabetes Mother   . Hypertension Father   . Diabetes Father   . Breast cancer Paternal Aunt   . Cancer Maternal Grandfather        throat cancer   . Cancer Cousin 11       GI cancer   . Thyroid disease Neg Hx     Social History Social History   Tobacco Use  . Smoking status: Never Smoker  . Smokeless tobacco: Never Used  Substance Use Topics  . Alcohol use: No    Frequency: Never  . Drug use: No     Allergies   Penicillins; Contrast media [iodinated diagnostic agents]; and Lipitor [atorvastatin]   Review of Systems Review of Systems  ROS reviewed and all otherwise negative except for mentioned in HPI   Physical Exam Updated Vital Signs BP 111/80 (BP Location: Right Arm)   Pulse 92    Temp 98.8 F (37.1 C) (Oral)   Resp 18   Ht '5\' 6"'$  (1.676 m)   Wt 53.1 kg (117 lb)   LMP 05/20/2016   SpO2 100%   BMI 18.88 kg/m  Vitals reviewed Physical Exam  Physical Examination: General appearance - alert, well appearing, and in no distress Mental status - alert, oriented to person, place, and time Eyes - no conjunctival injection, no scleral icterus Chest - clear to auscultation, no wheezes, rales or rhonchi, symmetric air entry Heart - normal rate, regular rhythm, normal S1, S2, no murmurs, rubs, clicks or gallops Abdomen - soft, nontender, nondistended, palpable mass in lower abdomen, no gaurding or rebound,  or organomegaly Neurological - alert, oriented, normal speech Extremities - peripheral pulses normal, left lower extremity edema Skin - normal coloration and turgor, no rashes   ED Treatments / Results  Labs (all labs ordered are listed, but only abnormal results are displayed) Labs Reviewed  BASIC METABOLIC PANEL - Abnormal; Notable for the following components:      Result Value   Sodium 132 (*)    Chloride 98 (*)    Glucose, Bld 110 (*)    Creatinine, Ser 1.16 (*)    Calcium 8.7 (*)    GFR calc non Af Amer 54 (*)    All other components within normal limits  CBC WITH DIFFERENTIAL/PLATELET - Abnormal; Notable for the following components:   RBC 3.58 (*)    Hemoglobin 10.1 (*)    HCT 30.7 (*)    RDW 17.1 (*)    All other components within normal limits    EKG  EKG Interpretation None       Radiology US Venous Img Lower Unilateral Left  Result Date: 11/13/2017 CLINICAL DATA:  Patient with left leg swelling. EXAM: LEFT LOWER EXTREMITY VENOUS DOPPLER ULTRASOUND TECHNIQUE: Gray-scale sonography with graded compression, as well as color Doppler and duplex ultrasound were performed to evaluate the lower extremity deep venous systems from  the level of the common femoral vein and including the common femoral, femoral, profunda femoral, popliteal and calf  veins including the posterior tibial, peroneal and gastrocnemius veins when visible. The superficial great saphenous vein was also interrogated. Spectral Doppler was utilized to evaluate flow at rest and with distal augmentation maneuvers in the common femoral, femoral and popliteal veins. COMPARISON:  None. FINDINGS: Contralateral Common Femoral Vein: Normal compressibility. Abnormal phasicity. Occlusive DVT is demonstrated within the left common femoral vein extending distally to the popliteal vein. The thrombus appears partially occlusive within the profundus femoral vein. The calf veins are poorly visualized however there is suggestion of extension of thrombus into the calf veins. IMPRESSION: Extensive occlusive DVT within the left lower extremity extending from the common femoral vein to the popliteal vein. The possibility of more central extension of clot is not excluded. Critical Value/emergent results were called by telephone at the time of interpretation on 11/13/2017 at 11:48 am to Dr. Townsend Roger , who verbally acknowledged these results. Electronically Signed   By: Lovey Newcomer M.D.   On: 11/13/2017 11:49    Procedures Procedures (including critical care time)  Medications Ordered in ED Medications  enoxaparin (LOVENOX) patient education kit ( Does not apply Canceled Entry 11/13/17 1243)  heparin lock flush 100 UNIT/ML injection (not administered)  enoxaparin (LOVENOX) injection 80 mg (80 mg Subcutaneous Given 11/13/17 1314)     Initial Impression / Assessment and Plan / ED Course  I have reviewed the triage vital signs and the nursing notes.  Pertinent labs & imaging results that were available during my care of the patient were reviewed by me and considered in my medical decision making (see chart for details).    12:11 PM pt with DVT in left lower extremity.  Lovenox is preferred over xarelto in cancer patients.  Pt given first dose of lovenox in the ED and will give 10 day course/rx-  she has an appointment tomorrow at the [facility] and they can decide if she should stay on lovenox for 3-6 months or switch to an oral anticoagulant.  Pt is very fearful about the injections.  No chest pain or shortness of breath at this time to suggest PE.  Pt counseled that if she develops these symptoms she should go the ED or call 911   Final Clinical Impressions(s) / ED Diagnoses   Final diagnoses:  Acute deep vein thrombosis (DVT) of left lower extremity, unspecified vein [facility])    ED Discharge Orders        Ordered    enoxaparin (LOVENOX) 40 MG/0.4ML injection  Every 24 hours     11/13/17 1157       Nevaan Bunton, Forbes Cellar, MD 11/13/17 1438

## 2017-11-13 NOTE — ED Triage Notes (Signed)
Pt c/o L leg swelling x 1 week. States she was evaluated 1 week ago and was told to wear compression socks and elevate. Pt states swelling is worsening. Denies pain.

## 2017-11-13 NOTE — Discharge Instructions (Signed)
Return to the ED with any concerns including chest pain, shortness of breath, increased swelling or pain in leg, bleeding in stools, blood in urine, or any other alarming symptoms

## 2017-11-13 NOTE — ED Notes (Signed)
Portacath to right chest accessed by [facility] RN per protocol

## 2017-11-14 ENCOUNTER — Inpatient Hospital Stay: Payer: Federal, State, Local not specified - PPO

## 2017-11-14 ENCOUNTER — Inpatient Hospital Stay (HOSPITAL_BASED_OUTPATIENT_CLINIC_OR_DEPARTMENT_OTHER): Payer: Federal, State, Local not specified - PPO | Admitting: Hematology

## 2017-11-14 ENCOUNTER — Encounter: Payer: Self-pay | Admitting: *Deleted

## 2017-11-14 ENCOUNTER — Telehealth: Payer: Self-pay | Admitting: Hematology

## 2017-11-14 ENCOUNTER — Telehealth: Payer: Self-pay | Admitting: *Deleted

## 2017-11-14 ENCOUNTER — Encounter: Payer: Self-pay | Admitting: Hematology

## 2017-11-14 VITALS — BP 123/83 | HR 96 | Temp 98.4°F | Resp 18 | Ht 66.0 in | Wt 123.0 lb

## 2017-11-14 DIAGNOSIS — D649 Anemia, unspecified: Secondary | ICD-10-CM

## 2017-11-14 DIAGNOSIS — C221 Intrahepatic bile duct carcinoma: Secondary | ICD-10-CM

## 2017-11-14 DIAGNOSIS — R911 Solitary pulmonary nodule: Secondary | ICD-10-CM | POA: Diagnosis not present

## 2017-11-14 DIAGNOSIS — Z5111 Encounter for antineoplastic chemotherapy: Secondary | ICD-10-CM | POA: Diagnosis not present

## 2017-11-14 DIAGNOSIS — F419 Anxiety disorder, unspecified: Secondary | ICD-10-CM

## 2017-11-14 DIAGNOSIS — C786 Secondary malignant neoplasm of retroperitoneum and peritoneum: Secondary | ICD-10-CM | POA: Diagnosis not present

## 2017-11-14 DIAGNOSIS — I82402 Acute embolism and thrombosis of unspecified deep veins of left lower extremity: Secondary | ICD-10-CM | POA: Diagnosis not present

## 2017-11-14 DIAGNOSIS — C787 Secondary malignant neoplasm of liver and intrahepatic bile duct: Secondary | ICD-10-CM

## 2017-11-14 DIAGNOSIS — C7800 Secondary malignant neoplasm of unspecified lung: Principal | ICD-10-CM

## 2017-11-14 DIAGNOSIS — C7801 Secondary malignant neoplasm of right lung: Principal | ICD-10-CM

## 2017-11-14 LAB — CBC WITH DIFFERENTIAL (CANCER CENTER ONLY)
Basophils Absolute: 0 10*3/uL (ref 0.0–0.1)
Basophils Relative: 0 %
Eosinophils Absolute: 0 10*3/uL (ref 0.0–0.5)
Eosinophils Relative: 0 %
HCT: 30.9 % — ABNORMAL LOW (ref 34.8–46.6)
Hemoglobin: 10 g/dL — ABNORMAL LOW (ref 11.6–15.9)
Lymphocytes Relative: 24 %
Lymphs Abs: 1.5 10*3/uL (ref 0.9–3.3)
MCH: 28 pg (ref 25.1–34.0)
MCHC: 32.4 g/dL (ref 31.5–36.0)
MCV: 86.6 fL (ref 79.5–101.0)
Monocytes Absolute: 0.4 10*3/uL (ref 0.1–0.9)
Monocytes Relative: 7 %
Neutro Abs: 4.4 10*3/uL (ref 1.5–6.5)
Neutrophils Relative %: 69 %
Platelet Count: 230 10*3/uL (ref 145–400)
RBC: 3.57 MIL/uL — ABNORMAL LOW (ref 3.70–5.45)
RDW: 17 % — ABNORMAL HIGH (ref 11.2–14.5)
WBC Count: 6.3 10*3/uL (ref 3.9–10.3)

## 2017-11-14 LAB — CMP (CANCER CENTER ONLY)
ALT: 16 U/L (ref 0–55)
AST: 23 U/L (ref 5–34)
Albumin: 2.8 g/dL — ABNORMAL LOW (ref 3.5–5.0)
Alkaline Phosphatase: 53 U/L (ref 40–150)
Anion gap: 9 (ref 3–11)
BUN: 14 mg/dL (ref 7–26)
CO2: 26 mmol/L (ref 22–29)
Calcium: 9.2 mg/dL (ref 8.4–10.4)
Chloride: 98 mmol/L (ref 98–109)
Creatinine: 0.98 mg/dL (ref 0.60–1.10)
GFR, Est AFR Am: >60 mL/min (ref >60)
GFR, Estimated: >60 mL/min (ref >60)
Glucose, Bld: 93 mg/dL (ref 70–140)
Potassium: 4.4 mmol/L (ref 3.5–5.1)
Sodium: 133 mmol/L — ABNORMAL LOW (ref 136–145)
Total Bilirubin: 1.9 mg/dL — ABNORMAL HIGH (ref 0.2–1.2)
Total Protein: 6.4 g/dL (ref 6.4–8.3)

## 2017-11-14 LAB — RETICULOCYTES
RBC.: 3.57 MIL/uL — ABNORMAL LOW (ref 3.70–5.45)
Retic Count, Absolute: 21.4 10*3/uL — ABNORMAL LOW (ref 33.7–90.7)
Retic Ct Pct: 0.6 % — ABNORMAL LOW (ref 0.7–2.1)

## 2017-11-14 MED ORDER — HEPARIN SOD (PORK) LOCK FLUSH 100 UNIT/ML IV SOLN
250.0000 [IU] | Freq: Once | INTRAVENOUS | Status: AC | PRN
  Administered 2017-11-14: 500 [IU]
  Filled 2017-11-14: qty 5

## 2017-11-14 MED ORDER — SODIUM CHLORIDE 0.9% FLUSH
10.0000 mL | INTRAVENOUS | Status: DC | PRN
  Administered 2017-11-14: 10 mL
  Filled 2017-11-14: qty 10

## 2017-11-14 MED ORDER — OMEPRAZOLE 40 MG PO CPDR: 40.0000 mg | DELAYED_RELEASE_CAPSULE | Freq: Two times a day (BID) | ORAL | 2 refills | Status: AC

## 2017-11-14 MED ORDER — SODIUM CHLORIDE 0.9 % IV SOLN
Freq: Once | INTRAVENOUS | Status: AC
  Administered 2017-11-14: 14:00:00 via INTRAVENOUS

## 2017-11-14 MED ORDER — MEGESTROL ACETATE 400 MG/10ML PO SUSP: 400.0000 mg | Freq: Every day | ORAL | 3 refills | Status: AC

## 2017-11-14 MED ORDER — ENOXAPARIN SODIUM 80 MG/0.8ML ~~LOC~~ SOLN: 80.0000 mg | SUBCUTANEOUS | 2 refills | Status: AC

## 2017-11-14 NOTE — Patient Instructions (Signed)
Dehydration, Adult Dehydration is when there is not enough fluid or water in your body. This happens when you lose more fluids than you take in. Dehydration can range from mild to very bad. It should be treated right away to keep it from getting very bad. Symptoms of mild dehydration may include:  Thirst.  Dry lips.  Slightly dry mouth.  Dry, warm skin.  Dizziness. Symptoms of moderate dehydration may include:  Very dry mouth.  Muscle cramps.  Dark pee (urine). Pee may be the color of tea.  Your body making less pee.  Your eyes making fewer tears.  Heartbeat that is uneven or faster than normal (palpitations).  Headache.  Light-headedness, especially when you stand up from sitting.  Fainting (syncope). Symptoms of very bad dehydration may include:  Changes in skin, such as: ? Cold and clammy skin. ? Blotchy (mottled) or pale skin. ? Skin that does not quickly return to normal after being lightly pinched and let go (poor skin turgor).  Changes in body fluids, such as: ? Feeling very thirsty. ? Your eyes making fewer tears. ? Not sweating when body temperature is high, such as in hot weather. ? Your body making very little pee.  Changes in vital signs, such as: ? Weak pulse. ? Pulse that is more than 100 beats a minute when you are sitting still. ? Fast breathing. ? Low blood pressure.  Other changes, such as: ? Sunken eyes. ? Cold hands and feet. ? Confusion. ? Lack of energy (lethargy). ? Trouble waking up from sleep. ? Short-term weight loss. ? Unconsciousness. Follow these instructions at home:  If told by your doctor, drink an ORS: ? Make an ORS by using instructions on the package. ? Start by drinking small amounts, about  cup (120 mL) every 5-10 minutes. ? Slowly drink more until you have had the amount that your doctor said to have.  Drink enough clear fluid to keep your pee clear or pale yellow. If you were told to drink an ORS, finish the ORS  first, then start slowly drinking clear fluids. Drink fluids such as: ? Water. Do not drink only water by itself. Doing that can make the salt (sodium) level in your body get too low (hyponatremia). ? Ice chips. ? Fruit juice that you have added water to (diluted). ? Low-calorie sports drinks.  Avoid: ? Alcohol. ? Drinks that have a lot of sugar. These include high-calorie sports drinks, fruit juice that does not have water added, and soda. ? Caffeine. ? Foods that are greasy or have a lot of fat or sugar.  Take over-the-counter and prescription medicines only as told by your doctor.  Do not take salt tablets. Doing that can make the salt level in your body get too high (hypernatremia).  Eat foods that have minerals (electrolytes). Examples include bananas, oranges, potatoes, tomatoes, and spinach.  Keep all follow-up visits as told by your doctor. This is important. Contact a doctor if:  You have belly (abdominal) pain that: ? Gets worse. ? Stays in one area (localizes).  You have a rash.  You have a stiff neck.  You get angry or annoyed more easily than normal (irritability).  You are more sleepy than normal.  You have a harder time waking up than normal.  You feel: ? Weak. ? Dizzy. ? Very thirsty.  You have peed (urinated) only a small amount of very dark pee during 6-8 hours. Get help right away if:  You have symptoms of   very bad dehydration.  You cannot drink fluids without throwing up (vomiting).  Your symptoms get worse with treatment.  You have a fever.  You have a very bad headache.  You are throwing up or having watery poop (diarrhea) and it: ? Gets worse. ? Does not go away.  You have blood or something green (bile) in your throw-up.  You have blood in your poop (stool). This may cause poop to look black and tarry.  You have not peed in 6-8 hours.  You pass out (faint).  Your heart rate when you are sitting still is more than 100 beats a  minute.  You have trouble breathing. This information is not intended to replace advice given to you by your health care provider. Make sure you discuss any questions you have with your health care provider. Document Released: 06/12/2009 Document Revised: 03/05/2016 Document Reviewed: 10/10/2015 Elsevier Interactive Patient Education  2018 [employer]  

## 2017-11-14 NOTE — Telephone Encounter (Signed)
Faxed ROI to [facility] 11/14/17 @ 2pm, release ID # [id number]

## 2017-11-14 NOTE — Progress Notes (Signed)
Per Dr. Burr Medico IVF today, however only 750 mL d/t peripheral edema.

## 2017-11-14 NOTE — Progress Notes (Signed)
[place] Social [facility]  Clinical Social Work was referred by patient to review and complete healthcare advance directives.  Clinical Social Worker met with patient and patients husband in the infusion room.  The patient designated Sumi Lye as their primary healthcare agent and Dixie Dials. Elnoria Howard as their secondary agent.  Patient also completed healthcare living will.    Clinical Social Worker notarized documents and made copies for patient/family. Clinical Social Worker will send documents to medical records to be scanned into patient's chart. Clinical Social Worker encouraged patient/family to contact with any additional questions or concerns.  Johnnye Lana, MSW, LCSW, OSW-C Clinical Social Worker [facility] [phone]

## 2017-11-14 NOTE — Telephone Encounter (Signed)
Appointments scheduled AVS/Calendar printed per 3/18 los °

## 2017-11-15 ENCOUNTER — Telehealth: Payer: Self-pay | Admitting: *Deleted

## 2017-11-15 LAB — CANCER ANTIGEN 19-9: CA 19-9: 6164 U/mL — ABNORMAL HIGH (ref 0–35)

## 2017-11-15 NOTE — Telephone Encounter (Signed)
Faxed demographics and copy of insurance card to [employer] 11/15/17.

## 2017-11-18 ENCOUNTER — Inpatient Hospital Stay: Payer: Federal, State, Local not specified - PPO

## 2017-11-18 ENCOUNTER — Telehealth: Payer: Self-pay | Admitting: *Deleted

## 2017-11-18 ENCOUNTER — Other Ambulatory Visit: Payer: Self-pay | Admitting: *Deleted

## 2017-11-18 VITALS — BP 157/93 | HR 92 | Temp 98.3°F | Resp 16

## 2017-11-18 DIAGNOSIS — E860 Dehydration: Secondary | ICD-10-CM

## 2017-11-18 DIAGNOSIS — Z5111 Encounter for antineoplastic chemotherapy: Secondary | ICD-10-CM | POA: Diagnosis not present

## 2017-11-18 DIAGNOSIS — Z95828 Presence of other vascular implants and grafts: Secondary | ICD-10-CM

## 2017-11-18 MED ORDER — SODIUM CHLORIDE 0.9 % IV SOLN
Freq: Once | INTRAVENOUS | Status: AC
  Administered 2017-11-18: 14:00:00 via INTRAVENOUS

## 2017-11-18 MED ORDER — HEPARIN SOD (PORK) LOCK FLUSH 100 UNIT/ML IV SOLN
500.0000 [IU] | Freq: Once | INTRAVENOUS | Status: AC
  Administered 2017-11-18: 500 [IU] via INTRAVENOUS
  Filled 2017-11-18: qty 5

## 2017-11-18 MED ORDER — SODIUM CHLORIDE 0.9% FLUSH
10.0000 mL | INTRAVENOUS | Status: DC | PRN
  Administered 2017-11-18: 10 mL via INTRAVENOUS
  Filled 2017-11-18: qty 10

## 2017-11-18 NOTE — Patient Instructions (Signed)
Dehydration, Adult Dehydration is when there is not enough fluid or water in your body. This happens when you lose more fluids than you take in. Dehydration can range from mild to very bad. It should be treated right away to keep it from getting very bad. Symptoms of mild dehydration may include:  Thirst.  Dry lips.  Slightly dry mouth.  Dry, warm skin.  Dizziness. Symptoms of moderate dehydration may include:  Very dry mouth.  Muscle cramps.  Dark pee (urine). Pee may be the color of tea.  Your body making less pee.  Your eyes making fewer tears.  Heartbeat that is uneven or faster than normal (palpitations).  Headache.  Light-headedness, especially when you stand up from sitting.  Fainting (syncope). Symptoms of very bad dehydration may include:  Changes in skin, such as: ? Cold and clammy skin. ? Blotchy (mottled) or pale skin. ? Skin that does not quickly return to normal after being lightly pinched and let go (poor skin turgor).  Changes in body fluids, such as: ? Feeling very thirsty. ? Your eyes making fewer tears. ? Not sweating when body temperature is high, such as in hot weather. ? Your body making very little pee.  Changes in vital signs, such as: ? Weak pulse. ? Pulse that is more than 100 beats a minute when you are sitting still. ? Fast breathing. ? Low blood pressure.  Other changes, such as: ? Sunken eyes. ? Cold hands and feet. ? Confusion. ? Lack of energy (lethargy). ? Trouble waking up from sleep. ? Short-term weight loss. ? Unconsciousness. Follow these instructions at home:  If told by your doctor, drink an ORS: ? Make an ORS by using instructions on the package. ? Start by drinking small amounts, about  cup (120 mL) every 5-10 minutes. ? Slowly drink more until you have had the amount that your doctor said to have.  Drink enough clear fluid to keep your pee clear or pale yellow. If you were told to drink an ORS, finish the ORS  first, then start slowly drinking clear fluids. Drink fluids such as: ? Water. Do not drink only water by itself. Doing that can make the salt (sodium) level in your body get too low (hyponatremia). ? Ice chips. ? Fruit juice that you have added water to (diluted). ? Low-calorie sports drinks.  Avoid: ? Alcohol. ? Drinks that have a lot of sugar. These include high-calorie sports drinks, fruit juice that does not have water added, and soda. ? Caffeine. ? Foods that are greasy or have a lot of fat or sugar.  Take over-the-counter and prescription medicines only as told by your doctor.  Do not take salt tablets. Doing that can make the salt level in your body get too high (hypernatremia).  Eat foods that have minerals (electrolytes). Examples include bananas, oranges, potatoes, tomatoes, and spinach.  Keep all follow-up visits as told by your doctor. This is important. Contact a doctor if:  You have belly (abdominal) pain that: ? Gets worse. ? Stays in one area (localizes).  You have a rash.  You have a stiff neck.  You get angry or annoyed more easily than normal (irritability).  You are more sleepy than normal.  You have a harder time waking up than normal.  You feel: ? Weak. ? Dizzy. ? Very thirsty.  You have peed (urinated) only a small amount of very dark pee during 6-8 hours. Get help right away if:  You have symptoms of   very bad dehydration.  You cannot drink fluids without throwing up (vomiting).  Your symptoms get worse with treatment.  You have a fever.  You have a very bad headache.  You are throwing up or having watery poop (diarrhea) and it: ? Gets worse. ? Does not go away.  You have blood or something green (bile) in your throw-up.  You have blood in your poop (stool). This may cause poop to look black and tarry.  You have not peed in 6-8 hours.  You pass out (faint).  Your heart rate when you are sitting still is more than 100 beats a  minute.  You have trouble breathing. This information is not intended to replace advice given to you by your health care provider. Make sure you discuss any questions you have with your health care provider. Document Released: 06/12/2009 Document Revised: 03/05/2016 Document Reviewed: 10/10/2015 Elsevier Interactive Patient Education  2018 [employer]  

## 2017-11-18 NOTE — Telephone Encounter (Signed)
Pt called requesting IVF today.  Dr. Burr Medico notified.  Spoke with pt and was informed that pt was feeling more tired, and not drinking enough fluids.   Pt would like to receive IVF today if possible.  Instructed pt to come in today at 2 pm for IVF as ok per Dr. Burr Medico.

## 2017-11-22 ENCOUNTER — Telehealth: Payer: Self-pay | Admitting: *Deleted

## 2017-11-22 NOTE — Telephone Encounter (Signed)
Received vm call from pt stating that she wants to come in tomorrow for IVF & req call back.  Returned call & she states she can't come today b/c her husband has the car.  When questioned about diarrhea, n/v, dehydration, she reports none of these but has what she thinks is constipation with some cramping.  She has taken a stool softener last hs & had a BM this am but doesn't feel like she has completely emptied.  Discussed increasing oral fluids, taking miralax this am & adding fiber rich foods.  She is in agreement to try this for now.  She is also concerned about taking lovenox inj & states she is finishing 10 days & wants to know if she needs to f/u with Dr Burr Medico.  Reviewed MD note & Dr Burr Medico refilled her lovenox & instructed to cont lovenox & discuss more at next MD visit.  She agreed with plan.

## 2017-11-25 NOTE — Progress Notes (Signed)
[facility]  [phone]) [phone] [phone]) [phone]  Clinic Follow Up Note   Patient Care Team: Carollee Herter, Alferd Apa, DO as PCP - General Pyrtle, Lajuan Lines, MD as Consulting Physician (Gastroenterology)   Date of Service:  11/28/2017  CHIEF COMPLAINTS:  Follow up metastatic intrahepatic cholangiocarcinoma  Oncology History   Metastatic cholangiocarcinoma to lung [facility])   Staging form: Perihilar Bile Ducts, AJCC 7th Edition   - Clinical stage from 07/07/2016: Stage IVB (T2b, N1, M1) - Signed by Truitt Merle, MD on 07/28/2016      Metastatic cholangiocarcinoma to lung ([place])   05/22/2014 Procedure    Routine screening colonoscopy showed a sessile polyp measuring 4 mm in the sigmoid colon, removed, otherwise negative.      06/29/2016 Imaging    CT chest, abdomen and pelvis showed 2 indeterminant nodule in left and the right lung, 4-48m, indeterminate a hypovascular liver lesions largest in the caudate lobe measuring 2.6 cm, mild abdominal lymphadenopathy in the gastrohepatic ligament and portocaval space.      07/02/2016 Imaging    Abdominal MRI with and without contrast showed 2 lobular lesions in the caudate lobe, and is regular lymph nodes in the gastrohepatic ligament which is adjacent to the liver lesion, suspicious for malignancy. 2 small lesions in the right hepatic lobe are indeterminate, but concerning for metastasis.      07/07/2016 Procedure    EGD was negative, EUS biopsy of the liver and adjacent lymph node      07/07/2016 Initial Diagnosis    Metastatic cholangiocarcinoma to lung ([place]      07/07/2016 Initial Biopsy    Fine-needle aspiration of the liver lesion in caudate lobe and adjacent lymph nodes both showed metastatic adenocarcinoma.      08/11/2016 - 04/25/2017 Chemotherapy    Cisplatin 25 mg/m, gemcitabine 1000 mg/m, on day one and 8, every 21 days   Cisplatin and gemcitabine on Day 1, 8 every 21 days, with neulasta on day 9, Gemcitabine dosed  reduced to 800 mg/m due to cytopenias, increased to '900mg'$ /m2 on 12/06/2016. Changes to gemcitabine maintenance therapy on day 1 and 8 every 21 days, from 04/18/2017, gemcitabine was reduced dose to 400 mg/m on 04/25/17       10/18/2016 Imaging    CT CAP  IMPRESSION: 1. Again noted are multifocal liver metastasis. Overall there has been no significant interval change in overall volume of liver metastases. 2. Similar appearance of upper abdominal adenopathy 3. Slight decrease in size of small pulmonary metastases.      01/25/2017 Imaging    CT CAP W Contrast 01/25/17 IMPRESSION: 1. Response to therapy. 2. Similar to minimal decrease in pulmonary nodules/metastasis. 3. Improvement in hepatic disease burden. 4. Decrease in abdominal adenopathy. 5. Significantly age advanced atherosclerosis, including within the coronary arteries. 6. Uterine fibroids. 7. Suspect a complex cyst in the left ovary. Recommend attention on follow-up.       04/13/2017 Imaging    CT CAP W Contrast IMPRESSION: 1. The dominant caudate lobe mass is stable in size and appearance, as is the indistinct adenopathy in the porta hepatis adjacent to the caudate lobe, and in the retroperitoneum. Several subtle additional liver lesions are slightly less conspicuous on today' s exam. The pulmonary nodules are stable, and mild omental nodularity in the left lower quadrant is very slightly worsened compared to prior. Overall the burden of malignancy appears similar to prior, and some of the findings may represent effectively treated metastatic lesions. 2. Complex left  adnexal mass, 6.7 by 4.1 cm, enlarging. Pelvic sonography recommended for further characterization. 3. Other imaging findings of potential clinical significance: Aortic Atherosclerosis (ICD10-I70.0). Coronary atherosclerosis. Prominent stool throughout the colon favors constipation. Fibroid uterus.      05/16/2017 - 06/27/2017 Chemotherapy    Second-line  chemo FOLFOX every 2 weeks with neulasta after pump DC. She will started with low dose FOLFOX on 05/16/17, due to her neutropenia. Held oxaliplatin with cycle two due to poor toleration. Discontinued after cycle 3 (06/27/17) due to disease progression.       07/20/2017 Imaging    IMPRESSION: CT CHEST  1. Interval development of 2 small new pulmonary nodules concerning for progressive, or new metastatic disease. Recommend continued attention on follow-up imaging. 2. The previously noted right upper and left upper lobe pulmonary nodules remain stable in size consistent with treated metastatic disease. 3. Stable position of right IJ approach power injectable port catheter. 4. Coronary artery calcifications. CT ABD/PELVIS  1. Marked interval enlargement of a complex, enhancing, cystic and solid left ovarian mass which now fills the posterior aspect of the pelvis exerting mass effect on the adjacent rectosigmoid colon and bladder. This likely accounts for the patient's lower abdominal pain and sensation of constipation (there is no significant stool burden within the rectum or sigmoid colon). These findings are highly suspicious for a primary ovarian cystic or mucinous adenocarcinoma. 2. Enlarging omental implants in the left lower quadrant highly concerning for metastatic ovarian carcinoma. 3. New/enlarging subcapsular hepatic lesion may represent progressive cholangio or ovarian carcinoma metastases. 4. Enlarging caudate lobe lesion and aortocaval/portacaval confluent necrotic adenopathy concerning for progressive metastatic cholangiocarcinoma. 5. The segment 6 hepatic lesion remains stable or may be smaller on today's examination. These results will be called to the ordering clinician or representative by the Radiologist Assistant, and communication documented in the PACS or zVision Dashboard.        07/30/2017 Imaging     CT AP W Contrast 07/30/17 IMPRESSION: 1. No  substantial interval change in exam. 2. Complex cystic and solid mass filling the cul-de-sac and left adnexal space is similar to prior. There is abnormal soft tissue in the anterior left lower quadrant concerning for metastatic disease. 3. Stable appearance of the caudate lesion in this patient with known cholangiocarcinoma. There is confluent necrotic lymphadenopathy in the porta hepatis, similar to prior with substantial mass-effect on the left renal vein as it enters the IVC. 4. Fibroid change in the uterus. 5. Trace intraperitoneal free fluid.      08/05/2017 Pathology Results    Diagnosis 08/05/17  Peritoneum, biopsy, left lower abdomen METASTATIC ADENOCARCINOMA, CONSISTENT WITH CHOLANGIOCARCINOMA. Microscopic Comment      08/08/2017 Imaging    CT AP WO Contrast 08/08/17 IMPRESSION: 1. No substantial interval change in exam although assessment today limited by lack of intravenous contrast material. 2. Stomach is distended with fluid, similar to prior. 3. Patient's known caudate liver lesion not well demonstrated on today's exam. 4. Similar appearance large complex cystic and solid mass in the cul-de-sac and left adnexal space. 5. Similar appearance abnormal soft tissue in the anterior left lower quadrant. 6. Fibroid change in the uterus.      08/16/2017 -  Chemotherapy    CAPOX: Xeloda '1500mg'$  BID for 1 week on and 1 week off with low dose oxaliplatin starting 08/16/17. Stopped Xeloda 09/19/17 and start 5-FU pump.         10/07/2017 Imaging    CT CAP W CONTRAST IMPRESSION: 1. Interval slight enlargement  of the central liver lesions/cholangiocarcinoma with progressive intrahepatic biliary dilatation. 2. New pulmonary metastatic lesions and several new hepatic metastatic lesions. 3. Slight interval enlargement of the periportal adenopathy surrounding the IVC. 4. Significant interval enlargement of the large cystic and solid pelvic mass now extending up to the level  of the umbilicus 5. Slight enlargement of the peritoneal implant in the left pelvis.      10/20/2017 Procedure    ERCP per Dr. Ardis Hughs: Impression: - Mid bile duct stricture which I was unable to stent.      10/26/2017 Procedure    ERCP per Dr. Lynita Lombard at [facility] digestive health endoscopy: Placement of a covered [id number] metal stent through a common hepatic duct stricture. This was technically difficult as it was a very tight, unusually firm stricture which may be extrinsic, related to nodal metastases or direct tumor infiltration from known cholangiocarcinoma. Following stent deployment, the waist at the stricture remained very narrow. An attempt to advance a dilating balloon both before and after stent placement was not successful.   Patient was observed overnight for pain management and discharged home next day.        HISTORY OF PRESENTING ILLNESS: 07/28/16 LEffie Berkshire89y.o. female is here because of Her recently diagnosed metastatic glandular carcinoma. She is accompanied by her husband and mother to my clinic today.  She has been having RUQ abdominal pain since mid 04/2016, she was seen by PCP and was treated for gas with GI cocktail and pepcide, which she did not help much. Her pain got worse, and radiates to right shoulder, she denies significant nausea, or bloating, BM normal, no fever, cough or dyspnea. She recently noticed mild chest discomfort in the low sternum area. She initially had the lab, ultrasound done by her primary care physician, which was unrevealing. She was referred to GI Dr. PHilarie Fredricksonand underwent EGD and CT scan, which showed a lobulated mass in the caudate lobe of liver, with at adjacent adenopathy. He underwent EUS and fine-needle biopsy of the liver mass and lymph nodes, all reviewed adenocarcinoma.  She has lost about 7lbs in the past 3 months. She has mild fatigue, but able to function at home. She has stopped working due to her abdominal pain and fatigue. She  takes Norco every 1-3 times a day, and her pain seems not well controlled.  CURRENT THERAPY: supportive care   INTERIM HISTORY:  LTyera Hansleyreturns for follow up and is accompanied by her husband and aunt. She reports she is doing fine overall. [facility] is going to [facility] herself today. She notes that her thrush symptoms have returned. She took Diflucan previously in early march and finished that. She did start taking Mycelex, which is not helping. She states that it is hard for her to eat but she does have appetite. She notes that her energy is low if she does too much.  On review of systems, pt denies abnormal bleeding, or any other complaints at this time. Pertinent positives are listed and detailed within the above HPI.   REVIEW OF SYSTEMS:  Constitutional: Denies fevers, chills or abnormal night sweats(+) improved appetite  (+) weight gain Eyes: Denies blurriness of vision, double vision or watery eyes Ears, nose, mouth, throat, and face: Denies mucositis or sore throat (+) thrush Respiratory: Denies dyspnea or wheezes Cardiovascular: Denies palpitation, chest discomfort or lower extremity swelling Gastrointestinal:  Denies heartburn or change in bowel habits (+) nausea improved  controlled with  transderm(+) abdominal pain Skin: Denies abnormal skin rashes  Lymphatics: Denies new lymphadenopathy or easy bruising Neurological:Denies numbness, tingling or new weaknesses  Behavioral/Psych:  (+) anxiety  Musculoskeletal: (+) back pain  All other systems were reviewed with the patient and are negative.   MEDICAL HISTORY:  Past Medical History:  Diagnosis Date  . Allergy    SEASONAL  . Anemia    [employer]  . Anxiety   . Atopic eczema   . cholangio ca dx'd 07/12/16  . Depression   . Family history of adverse reaction to anesthesia    Mother has allergic reactions stops breathing  . Fibrocystic breast disease   . GERD  (gastroesophageal reflux disease)   . Hx of migraines    seasonal   . Personal history of chemotherapy   . Pneumonia    2012  . TIA (transient ischemic attack)    Questionable migraine    SURGICAL HISTORY: Past Surgical History:  Procedure Laterality Date  . ENDOSCOPIC RETROGRADE CHOLANGIOPANCREATOGRAPHY (ERCP) WITH PROPOFOL N/A 10/20/2017   Procedure: ENDOSCOPIC RETROGRADE CHOLANGIOPANCREATOGRAPHY (ERCP) WITH PROPOFOL;  Surgeon: [name], MD;  Location: [facility] ENDOSCOPY;  Service: Endoscopy;  Laterality: N/A;  . EUS N/A 07/07/2016   Procedure: UPPER ENDOSCOPIC ULTRASOUND (EUS) RADIAL;  Surgeon: [name], MD;  Location: [facility] ENDOSCOPY;  Service: Endoscopy;  Laterality: N/A;  . IR GENERIC HISTORICAL  08/03/2016   IR US GUIDE VASC ACCESS RIGHT 08/03/2016 Corrie Mckusick, DO [facility]-INTERV RAD  . IR GENERIC HISTORICAL  08/03/2016   IR FLUORO GUIDE PORT INSERTION RIGHT 08/03/2016 Corrie Mckusick, DO [facility]-INTER[facility]  . MYOMECTOMY  2002    SOCIAL HISTORY: Social History   Socioeconomic History  . Marital status: Married    Spouse name: Not on file  . Number of children: 0  . Years of education: 54  . Highest education level: Not on file  Occupational History  . Occupation: HR fmla    Employer: [employer]    Employer: [employer]  Social Needs  . Financial resource strain: Not on file  . Food insecurity:    Worry: Not on file    Inability: Not on file  . Transportation needs:    Medical: Not on file    Non-medical: Not on file  Tobacco Use  . Smoking status: Never Smoker  . Smokeless tobacco: Never Used  Substance and Sexual Activity  . Alcohol use: No    Frequency: Never  . Drug use: No  . Sexual activity: Yes    Partners: Male  Lifestyle  . Physical activity:    Days per week: Not on file    Minutes per session: Not on file  . Stress: Not on file  Relationships  . Social connections:    Talks on phone: Not on file    Gets together: Not on file    Attends religious  service: Not on file    Active member of club or organization: Not on file    Attends meetings of clubs or organizations: Not on file    Relationship status: Not on file  . Intimate partner violence:    Fear of current or ex partner: Not on file    Emotionally abused: Not on file    Physically abused: Not on file    Forced sexual activity: Not on file  Other Topics Concern  . Not on file  Social History Narrative  . Not on file    FAMILY HISTORY: Family History  Problem Relation Age of Onset  . Gout Mother   . Diabetes Mother   . Hypertension Father   . Diabetes Father   . Breast cancer Paternal Aunt   . Cancer Maternal Grandfather        throat cancer   . Cancer Cousin 57       GI cancer   . Thyroid disease Neg Hx     ALLERGIES:  is allergic to penicillins; contrast media [iodinated diagnostic agents]; and lipitor [atorvastatin].  MEDICATIONS:  Current Outpatient Medications  Medication Sig Dispense Refill  . ALPRAZolam (XANAX) 0.5 MG tablet Take 1 tablet (0.5 mg total) by mouth 3 (three) times daily as needed for anxiety. 30 tablet 0  . cetirizine (ZYRTEC) 10 MG tablet Take 10 mg by mouth daily as needed for allergies. For allergies     . Cholecalciferol (VITAMIN D3) 5000 UNITS CAPS Take 5,000 Units by mouth daily.     . clotrimazole (MYCELEX) 10 MG troche Take 1 tablet (10 mg total) by mouth 5 (five) times daily. 35 tablet 0  . COLOSTRUM PO Take 1.25 g by mouth 2 (two) times daily.     Marland Kitchen dicyclomine (BENTYL) 20 MG tablet Take 20 mg by mouth 3 (three) times daily as needed for spasms.     Marland Kitchen dronabinol (MARINOL) 5 MG capsule Take 1 capsule (5 mg total) by mouth 2 (two) times daily before a meal. 60 capsule 1  . enoxaparin (LOVENOX) 80 MG/0.8ML injection Inject 0.8 mLs (80 mg total) into the skin daily. 30 Syringe 2  . fluconazole (DIFLUCAN) 100 MG tablet Take 1 tablet (100 mg total) by mouth daily. 7 tablet 0  . lidocaine-prilocaine (EMLA) cream Apply 1 application  topically as needed. Apply to portacath 1 1/2 hours - 2 hours prior to procedures as needed. 30 g 1  . LORazepam (ATIVAN) 0.5 MG tablet Place under tongue and dissolve every 8 hours as needed for nausea. Do not take Xanax while taking this medication. 30 tablet 0  . megestrol (MEGACE) 400 MG/10ML suspension Take 10 mLs (400 mg total) by mouth daily. (Patient taking differently: Take 400 mg by mouth as needed. ) 240 mL 3  . Menaquinone-7 (VITAMIN K2) 40 MCG TABS Take 40 mg by mouth daily.     . mometasone (ELOCON) 0.1 % lotion Apply topically daily. (Patient taking differently: Apply 1 application topically daily as needed. ) 60 mL 0  . omeprazole (PRILOSEC) 40 MG capsule Take 1 capsule (40 mg total) by mouth 2 (two) times daily before a meal. 60 capsule 2  . ondansetron (ZOFRAN) 8 MG tablet Take 1 tablet (8 mg total) by mouth every 8 (eight) hours as needed for nausea or vomiting. 30 tablet 3  . oxyCODONE (OXYCONTIN) 40 mg 12 hr tablet Take 1 tablet (40 mg total) by mouth every 12 (twelve) hours. 60 tablet 0  . Oxycodone HCl 10 MG TABS Take 1 tablet (10 mg total) by mouth every 3 (three) hours as needed. 100 tablet 0  . polyethylene glycol (MIRALAX / GLYCOLAX) packet Take 17 g by mouth 2 (two) times daily. Hold if you develop diarrhea (Patient taking differently: Take 17 g by mouth daily as needed for mild constipation. Hold if you develop diarrhea) 100 each 0  . prenatal vitamin w/FE, FA (PRENATAL 1 + 1) 27-1 MG TABS tablet Take 1 tablet by mouth daily at 12 noon.    . promethazine (PHENERGAN) 25 MG tablet Take 1 tablet (25 mg total) by  mouth every 6 (six) hours as needed for nausea or vomiting. 40 tablet 0  . scopolamine (TRANSDERM-SCOP) 1 MG/3DAYS Place 1 patch (1.5 mg total) onto the skin every 3 (three) days. (Patient taking differently: Place 1 patch onto the skin See admin instructions. Every 72 hours as needed) 10 patch 2  . senna (SENOKOT) 8.6 MG TABS tablet Take 2 tablets (17.2 mg total) by  mouth 2 (two) times daily. (Patient taking differently: Take 2 tablets by mouth 2 (two) times daily as needed for mild constipation. ) 120 each 0  . vitamin C (ASCORBIC ACID) 500 MG tablet Take 500 mg by mouth daily.     No current facility-administered medications for this visit.    Facility-Administered Medications Ordered in Other Visits  Medication Dose Route Frequency Provider Last Rate Last Dose  . alteplase (CATHFLO ACTIVASE) injection 2 mg  2 mg Intracatheter Once PRN Truitt Merle, MD      . heparin lock flush 100 unit/mL  250 Units Intracatheter Once PRN Truitt Merle, MD      . ondansetron [facility]) injection 8 mg  8 mg Intravenous Once Truitt Merle, MD      . sodium chloride flush (NS) 0.9 % injection 10 mL  10 mL Intracatheter PRN Truitt Merle, MD   10 mL at 09/09/16 1645  . sodium chloride flush (NS) 0.9 % injection 10 mL  10 mL Intravenous PRN Truitt Merle, MD   10 mL at 02/11/17 1515   PHYSICAL EXAMINATION:  ECOG PERFORMANCE STATUS: 3 Vitals:   11/28/17 1044  BP: (!) 146/94  Pulse: (!) 107  Resp: 18  Temp: 98.8 F (37.1 C)  TempSrc: Oral  SpO2: 100%  Weight: 121 lb 6.4 oz (55.1 kg)  Height: '5\' 6"'$  (1.676 m)    GENERAL:alert, no distress and comfortable SKIN: skin color, texture, turgor are normal, no rashes or significant lesions, several hematomas on extremities  EYES: normal, conjunctiva are pink and non-injected, sclera clear OROPHARYNX:no exudate, no erythema and lips, buccal mucosa, oral thrush resolved  NECK: supple, thyroid normal size, non-tender, without nodularity LYMPH:  no palpable lymphadenopathy in the cervical, axillary or inguinal LUNGS: clear to auscultation and percussion with normal breathing effort HEART: regular rate & rhythm and no murmurs and no lower extremity edema ABDOMEN: abdomen soft, non-tender and active bowel sounds, no hepatomegaly (+) mild tenderness on the right mid-abdomen with a large abdominal mass in that area. No  organomegaly. Musculoskeletal:no cyanosis of digits and no clubbing  PSYCH: alert & oriented x 3 with fluent speech NEURO: no focal motor/sensory deficits  LABORATORY DATA:  I have reviewed the data as listed CBC Latest Ref Rng & Units 11/28/2017 11/14/2017 11/13/2017  WBC 3.9 - 10.3 K/uL 4.9 6.3 6.6  Hemoglobin 12.0 - 15.0 g/dL - - 10.1(L)  Hematocrit 34.8 - 46.6 % 32.0(L) 30.9(L) 30.7(L)  Platelets 145 - 400 K/uL 371 230 240   CMP Latest Ref Rng & Units 11/28/2017 11/14/2017 11/13/2017  Glucose 70 - 140 mg/dL 92 93 110(H)  BUN 7 - 26 mg/dL '9 14 15  '$ Creatinine 0.60 - 1.10 mg/dL 0.84 0.98 1.16(H)  Sodium 136 - 145 mmol/L 134(L) 133(L) 132(L)  Potassium 3.5 - 5.1 mmol/L 3.6 4.4 3.9  Chloride 98 - 109 mmol/L 97(L) 98 98(L)  CO2 22 - 29 mmol/L '27 26 24  '$ Calcium 8.4 - 10.4 mg/dL 8.9 9.2 8.7(L)  Total Protein 6.4 - 8.3 g/dL 6.2(L) 6.4 -  Total Bilirubin 0.2 - 1.2 mg/dL 1.1 1.9(H) -  Alkaline Phos 40 - 150 U/L 61 53 -  AST 5 - 34 U/L 30 23 -  ALT 0 - 55 U/L 15 16 -    CA19.9 (0-35U/ml) 07/28/2016: 36 08/18/2016: 51 09/13/2016: 56 10/19/2016: 43 11/09/2016: 44 12/06/2016: 41 01/05/2017: 35 02/02/2017: 37 03/07/17: 43 04/18/17: 66 05/16/17: 111 06/08/17: 119 06/18/17: 107 07/18/17: 161 08/16/17: 272 09/19/17: 669 10/17/17: 1,826 11/14/17: 6,164  CA 125 07/25/17: 100.6  PATHOLOGY  Diagnosis 08/05/17  Peritoneum, biopsy, left lower abdomen METASTATIC ADENOCARCINOMA, CONSISTENT WITH CHOLANGIOCARCINOMA. Microscopic Comment The adenocarcinoma stains positive for ck7, negative for ER, GATA 3, ck20, cdx2, and TTF-1. Pax 8 (patchy stain). The morphology and immunostain pattern support the above diagnosis. The block B is sent to [facility] for further molecular testing.   Diagnosis 07/07/2016 FINE NEEDLE ASPIRATION, ENDOSCOPIC, LIVER (SPECIMEN 2 OF 2 COLLECTED 07/07/16): MALIGNANT CELLS CONSISTENT WITH ADENOCARCINOMA.  ADDITIONAL INFORMATION: Per request, immunohistochemistry was  performed. The cells are positive for cytokeratin 7 and negative for cytokeratin 20 and CDX-2. This excludes a colorectal primary. Pancreaticobiliary and upper GI sources remain in the differential. Called to Dr. Burr Medico on 07/28/2016.  Diagnosis 07/07/2016 FINE NEEDLE ASPIRATION, ENDOSCOPIC, GASTRO-HEPATIC LYMPH NODE (SPECIMEN 1 OF 2 COLLECTED 07/07/16): MALIGNANT CELLS CONSISTENT WITH ADENOCARCINOMA, SEE COMMENT. Preliminary Diagnosis Intraoperative Diagnosis: Few atypical glandular clusters - recommend additional material. 4) Adequate ([facility])   PROCEDURES  Upper EUS 07/07/2016 Dr. Ardis Hughs  - Endoscopically normal UGI tract (good views with standard gastroscope) - Vaguely bordered 4cm mass in the caudate lobe of liver that is confluent with what appear to be enlarged gastrohepatic lymphnodes (these may represent direct tumor extension however). Preliminary cytology from the lymphnodes shows malignancy, glandular. Preliminary cytology from the liver mass shows the same. Await final cytology results but this is most suspicious for peripheral intrahepatic cholangiocarcinoma with adjacent malignant adenopathy.  COLONOSCOPY 05/22/2014 ENDOSCOPIC IMPRESSION: 1. Sessile polyp measuring 4 mm in size was found in the sigmoid colon; polypectomy was performed with a cold snare 2. The colon mucosa was otherwise normal    RADIOGRAPHIC STUDIES: I have personally reviewed the radiological images as listed and agreed with the findings in the report.  CT AP W Contrast 10/17/17 IMPRESSION: 1. Interval slight enlargement of the central liver lesions/cholangiocarcinoma with progressive intrahepatic biliary dilatation. 2. New pulmonary metastatic lesions and several new hepatic metastatic lesions. 3. Slight interval enlargement of the periportal adenopathy surrounding the IVC. 4. Significant interval enlargement of the large cystic and solid pelvic mass now extending up to the level of the umbilicus 5.  Slight enlargement of the peritoneal implant in the left pelvis.  CT AP WO Contrast 08/08/17 IMPRESSION: 1. No substantial interval change in exam although assessment today limited by lack of intravenous contrast material. 2. Stomach is distended with fluid, similar to prior. 3. Patient's known caudate liver lesion not well demonstrated on today's exam. 4. Similar appearance large complex cystic and solid mass in the cul-de-sac and left adnexal space. 5. Similar appearance abnormal soft tissue in the anterior left lower quadrant. 6. Fibroid change in the uterus.  CT AP W Contrast 07/30/17 IMPRESSION: 1. No substantial interval change in exam. 2. Complex cystic and solid mass filling the cul-de-sac and left adnexal space is similar to prior. There is abnormal soft tissue in the anterior left lower quadrant concerning for metastatic disease. 3. Stable appearance of the caudate lesion in this patient with known cholangiocarcinoma. There is confluent necrotic lymphadenopathy in the porta hepatis, similar to prior with substantial mass-effect on  the left renal vein as it enters the IVC. 4. Fibroid change in the uterus. 5. Trace intraperitoneal free fluid.  CT CAP W Contrast 07/21/17 IMPRESSION: CT CHEST 1. Interval development of 2 small new pulmonary nodules concerning for progressive, or new metastatic disease. Recommend continued attention on follow-up imaging. 2. The previously noted right upper and left upper lobe pulmonary nodules remain stable in size consistent with treated metastatic disease. 3. Stable position of right IJ approach power injectable port catheter. 4. Coronary artery calcifications. CT ABD/PELVIS 1. Marked interval enlargement of a complex, enhancing, cystic and solid left ovarian mass which now fills the posterior aspect of the pelvis exerting mass effect on the adjacent rectosigmoid colon and bladder. This likely accounts for the patient's lower abdominal  pain and sensation of constipation (there is no significant stool burden within the rectum or sigmoid colon). These findings are highly suspicious for a primary ovarian cystic or mucinous adenocarcinoma. 2. Enlarging omental implants in the left lower quadrant highly concerning for metastatic ovarian carcinoma. 3. New/enlarging subcapsular hepatic lesion may represent progressive cholangio or ovarian carcinoma metastases. 4. Enlarging caudate lobe lesion and aortocaval/portacaval confluent necrotic adenopathy concerning for progressive metastatic cholangiocarcinoma. 5. The segment 6 hepatic lesion remains stable or may be smaller on today's examination. These results will be called to the ordering clinician or representative by the Radiologist Assistant, and communication documented in the PACS or zVision Dashboard.   CT CAP W Contrast, 04/13/17 IMPRESSION: 1. The dominant caudate lobe mass is stable in size and appearance, as is the indistinct adenopathy in the porta hepatis adjacent to the caudate lobe, and in the retroperitoneum. Several subtle additional liver lesions are slightly less conspicuous on today' s exam. The pulmonary nodules are stable, and mild omental nodularity in the left lower quadrant is very slightly worsened compared to prior. Overall the burden of malignancy appears similar to prior, and some of the findings may represent effectively treated metastatic lesions. 2. Complex left adnexal mass, 6.7 by 4.1 cm, enlarging. Pelvic sonography recommended for further characterization. 3. Other imaging findings of potential clinical significance: Aortic Atherosclerosis (ICD10-I70.0). Coronary atherosclerosis. Prominent stool throughout the colon favors constipation. Fibroid uterus.  CT CAP W Contrast 01/25/17 IMPRESSION: 1. Response to therapy. 2. Similar to minimal decrease in pulmonary nodules/metastasis. 3. Improvement in hepatic disease burden. 4. Decrease in abdominal  adenopathy. 5. Significantly age advanced atherosclerosis, including within the coronary arteries. 6. Uterine fibroids. 7. Suspect a complex cyst in the left ovary. Recommend attention on follow-up.  CT CAP W CONTRAST 10/18/16 IMPRESSION: 1. Again noted are multifocal liver metastasis. Overall there has been no significant interval change in overall volume of liver metastases. 2. Similar appearance of upper abdominal adenopathy 3. Slight decrease in size of small pulmonary metastases.   ASSESSMENT & PLAN:  52 y.o. African-American female, without significant past medical history, presented with abdominal pain  1. Metastatic intrahepatic cholangiocarcinoma, with node, lung and peritoneal metastasis, [id number], stage IV -I previously reviewed her CT, MRI, endoscopy findings and her biopsy results with patient and her family members in details. -We have previously reviewed her case in our GI tumor board. Based on the scans and endoscopy findings, this is most consistent with cholangiocarcinoma. Her liver and inguinal biopsy showed adenocarcinoma, IHC was consistent with pancreatic-biliary primary -She has 2 additional small liver lesions and 2 4-54m lung lesions, which are indeterminate but that is suspicious for metastatic disease. -She was seen by surgeon Dr. BBarry Dieneswho thinks she is a not a  candidate for surgical resection due to her metastatic disease. -We previously discussed her cancer is incurable at this stage, the goal of therapy is palliation, to prolong life and improve her quality life -she responded well to first-line cisplatin and gemcitabine, on day 1, 8 every 21 days.  She developed significant cytopenia after 8-9 months therapy and had to stop --She started second line FOLFOX on 05/16/17. Held oxaliplatin with cycle two due to poor toleration.  -Her CT CAP from 07/21/17 showed new peritoneal carcinomatosis with a large complex ovarian mass, primary ovarian cancer vs metastatic  cholangiocarcinoma. I recommend IR biopsy of her peritoneal lesion, checked CA125 and it was 100.6 on 07/25/17.  -Her IR biopsy of LLQ omental implant from 08/05/17 showed metastatic disease consistent with cholangiocarcinoma. I have discussed with Dr. Leamon Arnt at [facility] for consideration of other options or clinical trial. 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  Unfortunately she is not a candidate for immunotherapy based on the MSI, there was one rearrangement in intron is unlikely to predict response to TKI such as crizotinib. She also has ERBB2 amplification, but HER2 FISH was negative, she will unlikely benefit from HER2 antibody such as Herceptin. -She completed 3 cycles of low-dose Capox, her overall condition improved, however she had moderate side effect from Xeloda, her total bilirubin also slightly increased, chemo changed back to FOLFOX -She had persistent fatigue, nausea, vomiting, and abdominal cramps, and constipation after cycle 1. Treatment was held on 10/03/17 due to side effects and labs; she had an elevated creatinine Cr 1.44 and hyperbilirubinemia bili 2.0.  -On 10/17/17, her creatinine Cr 1.59 and hyperbilirubinemia bili 6.1. I gave her IV fluids and held chemo.  -I previously reviewed the CT myself and spoke with the radiologist. I discussed the results with the pt and her family. I will speak with gastroenterology Dr. Ardis Hughs about possibly putting a stent in the common bile duct to lower her bilirubin. If this procedure is not feasible, there is another procedure to drain the bile through IR.  -She underwent CBD stent placement at [facility] on 10/26/17, tolerated procedure well. She is recovering slowly. She still quite fatigued, appetite has improved some. Her hyperbilirubinemia has improved with tbil 1.9 today.  -I previously discussed with Dr.  Leamon Arnt at [facility]. If her hyperbilirubinemia able to resolve, and her performance status improves, I would recommend her to consider clinical trial at [facility] once she is recovered enough to travel.   -Due to her poor performance status, she is probably not a candidate for early phase clinical trial.  I did refer her to [facility] in [place] and discuss her clinical options with them. She is planning on calling them today.  -Due to her limited performance status, and limited chemotherapy options, I would not offer her more chemotherapy at this point. She is spending most of her time in a recliner at home and I do not think she will be able to tolerate cytotoxic chemo. -I discussed the option of palliative care and Hospice care today since she is not on any active treatment. She understands the goal of the service and will think about it if clinical trial is not an option. I also discussed the option of regular home care service. She will think about it. -pt requests a prescription for a wheelchair, I will write this today -  She wishes to continue to follow up with me for now. -F/u in 2 weeks with IVF   2. Obstructive jaundice  -Her total bilirubin has been trending up, 6.0 previously -This is likely secondary to the obstruction from her cranial carcinoma, periportal adenopathy -She underwent CBD stent placement at [facility] on 10/26/17 -bilirubin is trending down -normal today (11/28/17)  3. Abdominal pain, back pain  -She has had worsening abdominal pain lately due to cancer progression, now much better controlled  -Continue OxyContin 60 mg every 12 hours, and oxycodone 10 mg every 3 hours as needed for pain  -She also has small amount of Dilaudid at home, she will use it as needed for severe pain  -abdominal pain exacerbated by treatment, previously post poned treatment 1 week to stabilize her pain.  -She was taking 60 mg of OxyContin every 12 hrs, and oxycodone  as needed, her abdominal pain has been well controlled lately. -She is now taking OxyContin 40 mg TID and states she has been taking oxycodone as needed more often lately. Refilled for her today (11/28/17)   4. Nausea and anorexia and weight loss -We previously reviewed her nausea management, she will continue Compazine and Zofran -I previously encouraged her to take a nutritional supplement -We previously discussed appetite stimulant, she is on Marinol, dose was increased to 5 mg twice daily last week, appetite improved, will continue. -she will f/u with dietician  -She has tried Marinol, now on Megace, appetite has improved since her pain is controlled -Continue Megace -Nausea controlled with scopolamine patch, PPI, and zofran -Refilled Megace on 11/14/17  5. Goal of care discussion  -We again discussed the incurable nature of her cancer, and the overall poor prognosis, especially she has had a major disease progression lately, and she has had several line treatment.  Her prognosis is guarded -The patient understands that I cannot offer her any treatment at this time. -I had a long conversation about code status discussion with her today, I recommended DNR/DNI, but she remains to be full code for now.  She has a living will currently and did this through [facility].   6. Constipation -I previously discussed this can worsen with increase in pain medication   -I previously suggested she can take 4-6 tablets of seneca a day. If not enough she can take half bottle magnesium citrate until she has a BM. She can take seneca at least 2 tablets daily to make sure she has regular BM -She will continue senokot with or without miralax daily to maintain BM q1-2 days. I advised her she can take up to 6 times daily. She can also try mag citrate if no BM soon.   7. Peripheral Neuropathy, grade 1 -to her feet, likely secondary to chemotherapy.  8. Anxiety -she takes Xanax about 3 times a week now. Refilled  on 10/17/17  9. Leg swelling and Acute DVT in LLE 11/13/17 -She saw Lucianne Lei at symptom management clinic for ankle swelling last week and was improving with compression stockings -She previously stated she felt a tightness only in her LLE and she went to the ED where they found a DVT -She was discharged with Lovenox and instructed to take 80 ml daily.  -I previously discussed oral anticoagulate options, I discussed than Lovenox injections are more effective in cancer patients. She can switch to oral in a few months if she would like. She is agreeable to continue with Lovenox injections today, I refilled  -She can continue  to wear compression stocking on right leg.  -She still has swelling today, L > R. It is improving  10. Oral thrush -Refilled Diflucan '100mg'$  dialy for 7 days today    Plan -Refilled 40 mg Oxycontin and oxycodone today -IVF fluids today for 750cc  -She will call [facility] today about clinic trial option -Prescription for wheelchair today -Refilled Diflucan today  -I recommend DNR/DNI and discussed hospice today, she will think about it -F/u in 2 weeks with IVF    All questions were answered. The patient knows to call the clinic with any problems, questions or concerns.  I spent 30 minutes counseling the patient face to face. The total time spent in the appointment was 40 minutes and more than 50% was on counseling.  This document serves as a record of services personally performed by Truitt Merle, MD. It was created on her behalf by Theresia Bough, a trained medical scribe. The creation of this record is based on the scribe's personal observations and the provider's statements to them.   I have reviewed the above documentation for accuracy and completeness, and I agree with the above.    Truitt Merle, MD 11/28/2017

## 2017-11-28 ENCOUNTER — Inpatient Hospital Stay: Payer: Federal, State, Local not specified - PPO

## 2017-11-28 ENCOUNTER — Inpatient Hospital Stay: Payer: Federal, State, Local not specified - PPO | Admitting: Nutrition

## 2017-11-28 ENCOUNTER — Inpatient Hospital Stay (HOSPITAL_BASED_OUTPATIENT_CLINIC_OR_DEPARTMENT_OTHER): Payer: Federal, State, Local not specified - PPO | Admitting: Hematology

## 2017-11-28 ENCOUNTER — Telehealth: Payer: Self-pay | Admitting: Hematology

## 2017-11-28 ENCOUNTER — Encounter: Payer: Self-pay | Admitting: Hematology

## 2017-11-28 ENCOUNTER — Inpatient Hospital Stay: Payer: Federal, State, Local not specified - PPO | Attending: Hematology

## 2017-11-28 VITALS — BP 146/94 | HR 107 | Temp 98.8°F | Resp 18 | Ht 66.0 in | Wt 121.4 lb

## 2017-11-28 DIAGNOSIS — M545 Low back pain: Secondary | ICD-10-CM | POA: Insufficient documentation

## 2017-11-28 DIAGNOSIS — R109 Unspecified abdominal pain: Secondary | ICD-10-CM | POA: Insufficient documentation

## 2017-11-28 DIAGNOSIS — C221 Intrahepatic bile duct carcinoma: Secondary | ICD-10-CM

## 2017-11-28 DIAGNOSIS — C7800 Secondary malignant neoplasm of unspecified lung: Secondary | ICD-10-CM | POA: Insufficient documentation

## 2017-11-28 DIAGNOSIS — B370 Candidal stomatitis: Secondary | ICD-10-CM | POA: Insufficient documentation

## 2017-11-28 DIAGNOSIS — Z7189 Other specified counseling: Secondary | ICD-10-CM | POA: Insufficient documentation

## 2017-11-28 DIAGNOSIS — C787 Secondary malignant neoplasm of liver and intrahepatic bile duct: Secondary | ICD-10-CM

## 2017-11-28 DIAGNOSIS — I82402 Acute embolism and thrombosis of unspecified deep veins of left lower extremity: Secondary | ICD-10-CM | POA: Insufficient documentation

## 2017-11-28 DIAGNOSIS — C786 Secondary malignant neoplasm of retroperitoneum and peritoneum: Secondary | ICD-10-CM | POA: Diagnosis present

## 2017-11-28 DIAGNOSIS — C7801 Secondary malignant neoplasm of right lung: Principal | ICD-10-CM

## 2017-11-28 DIAGNOSIS — G620 Drug-induced polyneuropathy: Secondary | ICD-10-CM | POA: Insufficient documentation

## 2017-11-28 DIAGNOSIS — F419 Anxiety disorder, unspecified: Secondary | ICD-10-CM

## 2017-11-28 DIAGNOSIS — K769 Liver disease, unspecified: Secondary | ICD-10-CM

## 2017-11-28 DIAGNOSIS — Z5111 Encounter for antineoplastic chemotherapy: Secondary | ICD-10-CM | POA: Diagnosis present

## 2017-11-28 LAB — CMP (CANCER CENTER ONLY)
ALT: 15 U/L (ref 0–55)
AST: 30 U/L (ref 5–34)
Albumin: 3 g/dL — ABNORMAL LOW (ref 3.5–5.0)
Alkaline Phosphatase: 61 U/L (ref 40–150)
Anion gap: 10 (ref 3–11)
BUN: 9 mg/dL (ref 7–26)
CO2: 27 mmol/L (ref 22–29)
Calcium: 8.9 mg/dL (ref 8.4–10.4)
Chloride: 97 mmol/L — ABNORMAL LOW (ref 98–109)
Creatinine: 0.84 mg/dL (ref 0.60–1.10)
GFR, Est AFR Am: >60 mL/min (ref >60)
GFR, Estimated: >60 mL/min (ref >60)
Glucose, Bld: 92 mg/dL (ref 70–140)
Potassium: 3.6 mmol/L (ref 3.5–5.1)
Sodium: 134 mmol/L — ABNORMAL LOW (ref 136–145)
Total Bilirubin: 1.1 mg/dL (ref 0.2–1.2)
Total Protein: 6.2 g/dL — ABNORMAL LOW (ref 6.4–8.3)

## 2017-11-28 LAB — CBC WITH DIFFERENTIAL (CANCER CENTER ONLY)
Basophils Absolute: 0 10*3/uL (ref 0.0–0.1)
Basophils Relative: 1 %
Eosinophils Absolute: 0 10*3/uL (ref 0.0–0.5)
Eosinophils Relative: 0 %
HCT: 32 % — ABNORMAL LOW (ref 34.8–46.6)
Hemoglobin: 10.4 g/dL — ABNORMAL LOW (ref 11.6–15.9)
Lymphocytes Relative: 22 %
Lymphs Abs: 1.1 10*3/uL (ref 0.9–3.3)
MCH: 27.5 pg (ref 25.1–34.0)
MCHC: 32.6 g/dL (ref 31.5–36.0)
MCV: 84.6 fL (ref 79.5–101.0)
Monocytes Absolute: 0.4 10*3/uL (ref 0.1–0.9)
Monocytes Relative: 8 %
Neutro Abs: 3.4 10*3/uL (ref 1.5–6.5)
Neutrophils Relative %: 69 %
Platelet Count: 371 10*3/uL (ref 145–400)
RBC: 3.79 MIL/uL (ref 3.70–5.45)
RDW: 19 % — ABNORMAL HIGH (ref 11.2–14.5)
WBC Count: 4.9 10*3/uL (ref 3.9–10.3)

## 2017-11-28 MED ORDER — SODIUM CHLORIDE 0.9 % IV SOLN
Freq: Once | INTRAVENOUS | Status: AC
  Administered 2017-11-28: 12:00:00 via INTRAVENOUS

## 2017-11-28 MED ORDER — OXYCODONE HCL 10 MG PO TABS: 10.0000 mg | ORAL_TABLET | ORAL | 0 refills | Status: AC | PRN

## 2017-11-28 MED ORDER — SODIUM CHLORIDE 0.9% FLUSH
10.0000 mL | INTRAVENOUS | Status: DC | PRN
  Administered 2017-11-28: 10 mL
  Filled 2017-11-28: qty 10

## 2017-11-28 MED ORDER — FLUCONAZOLE 100 MG PO TABS: 100.0000 mg | ORAL_TABLET | Freq: Every day | ORAL | 0 refills | Status: AC

## 2017-11-28 MED ORDER — OXYCODONE HCL ER 40 MG PO T12A: 40.0000 mg | EXTENDED_RELEASE_TABLET | Freq: Two times a day (BID) | ORAL | 0 refills | Status: AC

## 2017-11-28 MED ORDER — HEPARIN SOD (PORK) LOCK FLUSH 100 UNIT/ML IV SOLN
500.0000 [IU] | Freq: Once | INTRAVENOUS | Status: AC | PRN
  Administered 2017-11-28: 500 [IU]
  Filled 2017-11-28: qty 5

## 2017-11-28 NOTE — Progress Notes (Signed)
Nutrition follow-up completed with patient during infusion for cholioangiocarcinoma. Weight improved and was documented as 121.4 pounds April 1. Patient admits to edema in lower extremities. Patient has questions about sodium restriction.  Nutrition diagnosis:  Unintended weight loss cannot be evaluated secondary to edema.  Intervention: Provided education on the importance of continuing high calorie, high protein foods. Recommended patient not add additional sodium to food.   Other questions were answered and teach back method was used  Monitoring evaluation goals: Patient will tolerate increased calories and protein to maintain lean body mass.  Next visit: Monday, April 15 during infusion.  **Disclaimer: This note was dictated with voice recognition software. Similar sounding words can inadvertently be transcribed and this note may contain transcription errors which may not have been corrected upon publication of note.**

## 2017-11-28 NOTE — Telephone Encounter (Signed)
Scheduled appt per 4/1 los - Gave patient AVS and calender per los.

## 2017-11-28 NOTE — Patient Instructions (Signed)
Dehydration, Adult Dehydration is when there is not enough fluid or water in your body. This happens when you lose more fluids than you take in. Dehydration can range from mild to very bad. It should be treated right away to keep it from getting very bad. Symptoms of mild dehydration may include:  Thirst.  Dry lips.  Slightly dry mouth.  Dry, warm skin.  Dizziness. Symptoms of moderate dehydration may include:  Very dry mouth.  Muscle cramps.  Dark pee (urine). Pee may be the color of tea.  Your body making less pee.  Your eyes making fewer tears.  Heartbeat that is uneven or faster than normal (palpitations).  Headache.  Light-headedness, especially when you stand up from sitting.  Fainting (syncope). Symptoms of very bad dehydration may include:  Changes in skin, such as: ? Cold and clammy skin. ? Blotchy (mottled) or pale skin. ? Skin that does not quickly return to normal after being lightly pinched and let go (poor skin turgor).  Changes in body fluids, such as: ? Feeling very thirsty. ? Your eyes making fewer tears. ? Not sweating when body temperature is high, such as in hot weather. ? Your body making very little pee.  Changes in vital signs, such as: ? Weak pulse. ? Pulse that is more than 100 beats a minute when you are sitting still. ? Fast breathing. ? Low blood pressure.  Other changes, such as: ? Sunken eyes. ? Cold hands and feet. ? Confusion. ? Lack of energy (lethargy). ? Trouble waking up from sleep. ? Short-term weight loss. ? Unconsciousness. Follow these instructions at home:  If told by your doctor, drink an ORS: ? Make an ORS by using instructions on the package. ? Start by drinking small amounts, about  cup (120 mL) every 5-10 minutes. ? Slowly drink more until you have had the amount that your doctor said to have.  Drink enough clear fluid to keep your pee clear or pale yellow. If you were told to drink an ORS, finish the ORS  first, then start slowly drinking clear fluids. Drink fluids such as: ? Water. Do not drink only water by itself. Doing that can make the salt (sodium) level in your body get too low (hyponatremia). ? Ice chips. ? Fruit juice that you have added water to (diluted). ? Low-calorie sports drinks.  Avoid: ? Alcohol. ? Drinks that have a lot of sugar. These include high-calorie sports drinks, fruit juice that does not have water added, and soda. ? Caffeine. ? Foods that are greasy or have a lot of fat or sugar.  Take over-the-counter and prescription medicines only as told by your doctor.  Do not take salt tablets. Doing that can make the salt level in your body get too high (hypernatremia).  Eat foods that have minerals (electrolytes). Examples include bananas, oranges, potatoes, tomatoes, and spinach.  Keep all follow-up visits as told by your doctor. This is important. Contact a doctor if:  You have belly (abdominal) pain that: ? Gets worse. ? Stays in one area (localizes).  You have a rash.  You have a stiff neck.  You get angry or annoyed more easily than normal (irritability).  You are more sleepy than normal.  You have a harder time waking up than normal.  You feel: ? Weak. ? Dizzy. ? Very thirsty.  You have peed (urinated) only a small amount of very dark pee during 6-8 hours. Get help right away if:  You have symptoms of   very bad dehydration.  You cannot drink fluids without throwing up (vomiting).  Your symptoms get worse with treatment.  You have a fever.  You have a very bad headache.  You are throwing up or having watery poop (diarrhea) and it: ? Gets worse. ? Does not go away.  You have blood or something green (bile) in your throw-up.  You have blood in your poop (stool). This may cause poop to look black and tarry.  You have not peed in 6-8 hours.  You pass out (faint).  Your heart rate when you are sitting still is more than 100 beats a  minute.  You have trouble breathing. This information is not intended to replace advice given to you by your health care provider. Make sure you discuss any questions you have with your health care provider. Document Released: 06/12/2009 Document Revised: 03/05/2016 Document Reviewed: 10/10/2015 Elsevier Interactive Patient Education  2018 [employer]  

## 2017-12-02 ENCOUNTER — Encounter (HOSPITAL_COMMUNITY): Payer: Self-pay | Admitting: Hematology

## 2017-12-02 ENCOUNTER — Other Ambulatory Visit: Payer: Self-pay | Admitting: Nurse Practitioner

## 2017-12-02 DIAGNOSIS — C7800 Secondary malignant neoplasm of unspecified lung: Principal | ICD-10-CM

## 2017-12-02 DIAGNOSIS — C221 Intrahepatic bile duct carcinoma: Secondary | ICD-10-CM

## 2017-12-03 IMAGING — CT CT CHEST W/ CM
2 of 5 series · 13 of 36 positions shown, 16 images · IV contrast (APPLIED)
Comparison: Multiple exams, including 01/25/2017

CLINICAL DATA: Geode carcinoma diagnosed in June 2016,
metastatic to the right lung, with ongoing chemotherapy. Left upper
quadrant abdominal pain for the past 2 weeks.

EXAM:
CT CHEST, ABDOMEN, AND PELVIS WITH CONTRAST
TECHNIQUE: Multidetector CT imaging of the chest, abdomen and pelvis was
performed following the standard protocol during bolus
administration of intravenous contrast.
CONTRAST:  100mL QG0LOS-KNN IOPAMIDOL (QG0LOS-KNN) INJECTION 61%

[Series 2: cap with · axial · 0.66mm/px · z∈[-436,+64]mm · 10 of 124 slices shown, 13 images]
[im 12/124  mediastinal]
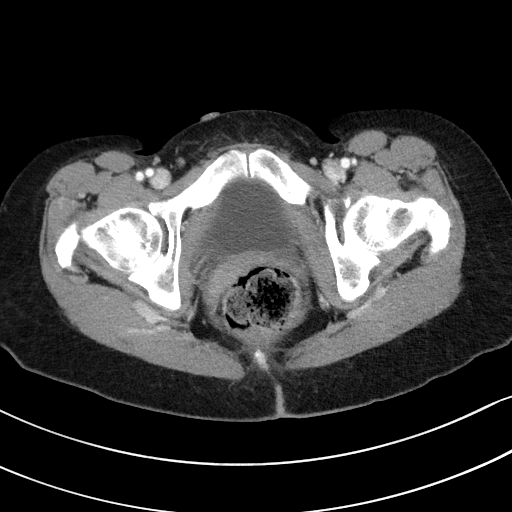
[im 12/124  lung]
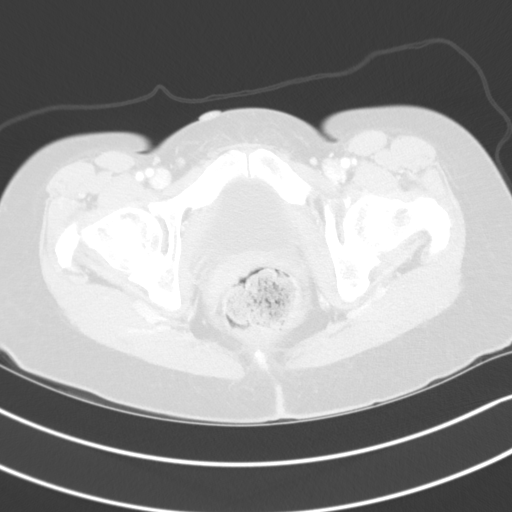
[im 23/124  lung]
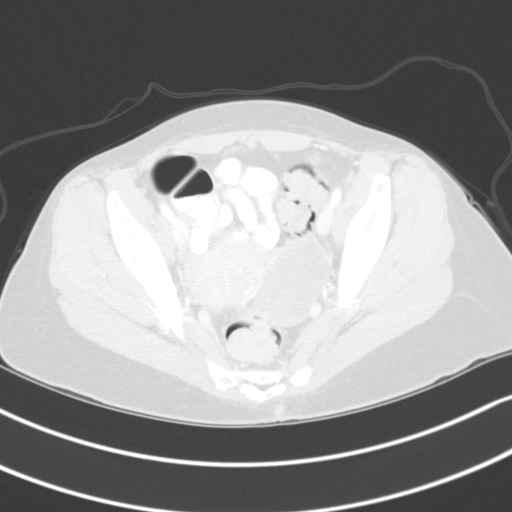
[im 34/124  lung]
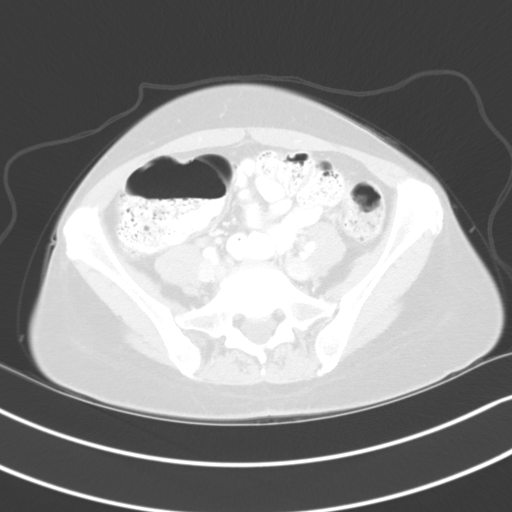
[im 45/124  lung]
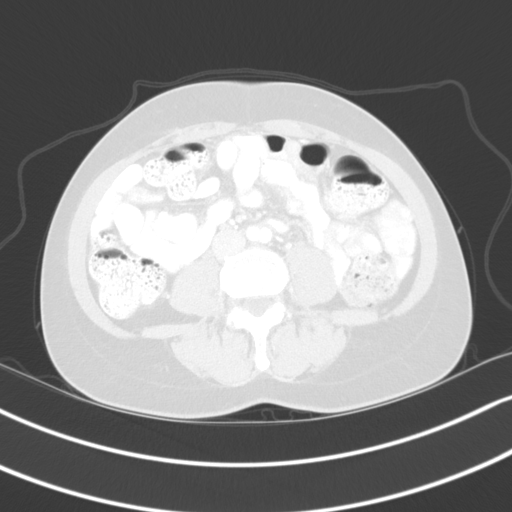
[im 56/124  mediastinal]
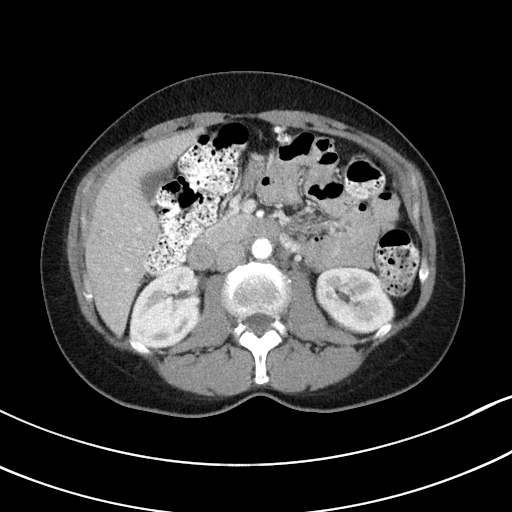
[im 56/124  lung]
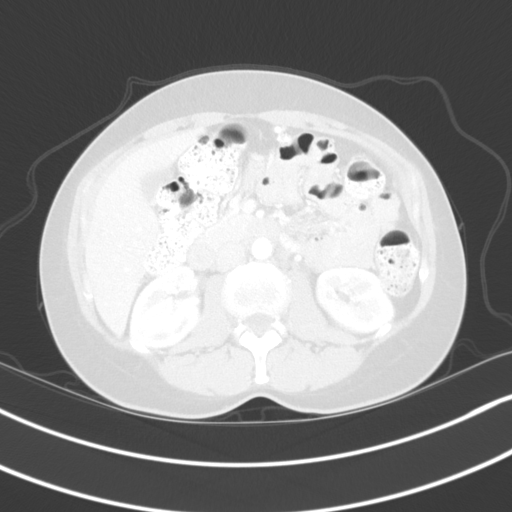
[im 68/124  lung]
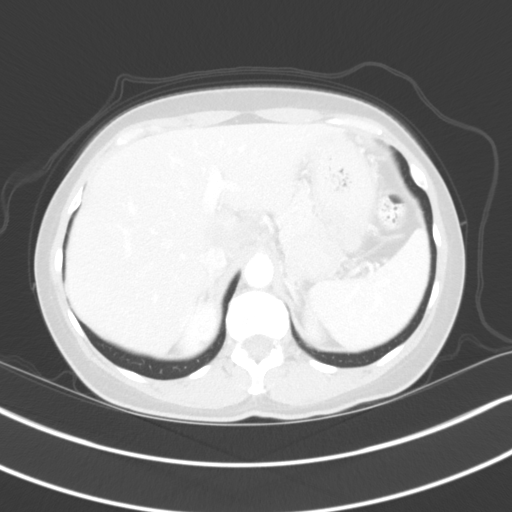
[im 79/124  lung]
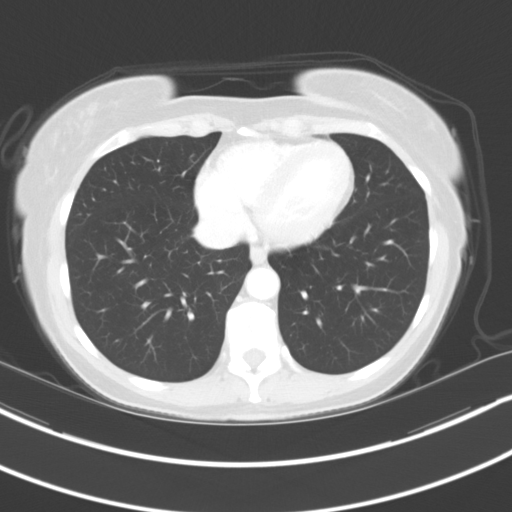
[im 90/124  lung]
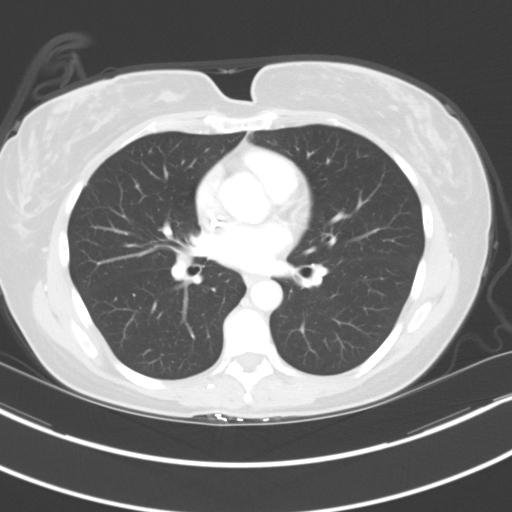
[im 101/124  mediastinal]
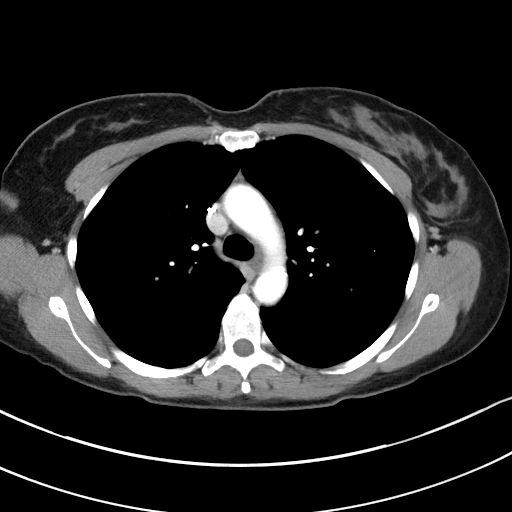
[im 101/124  lung]
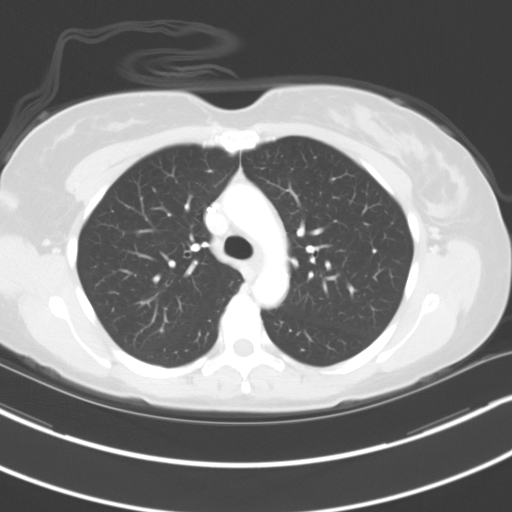
[im 112/124  lung]
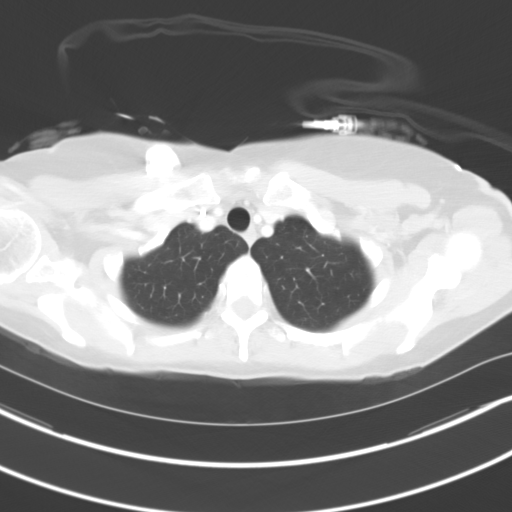

[Series 5: coronals · coronal · 0.82mm/px · 3 of 137 slices shown]
[im 28/137  lung]
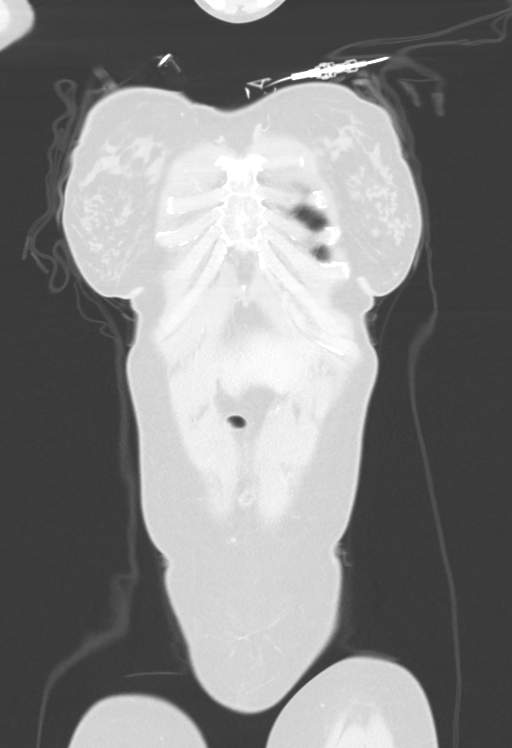
[im 55/137  lung]
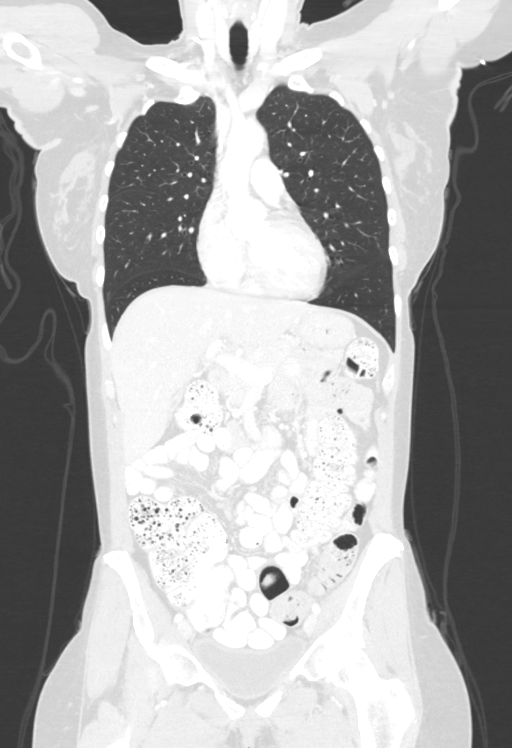
[im 82/137  lung]
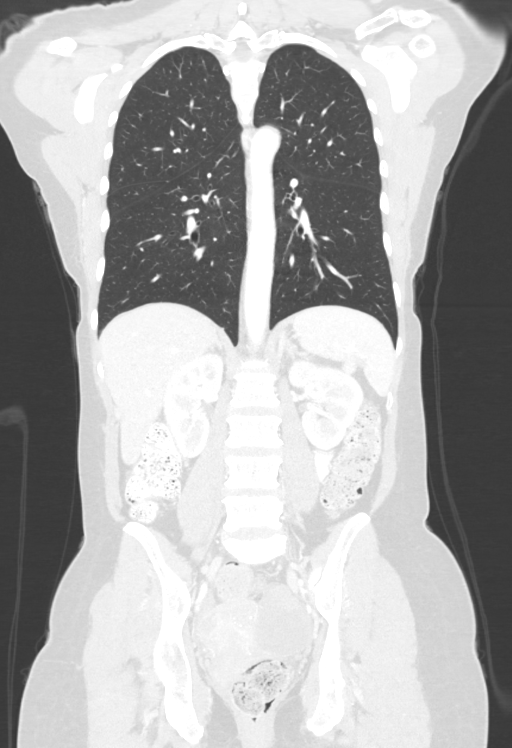

[13 of 36 positions shown; findings below may reference images not displayed]

FINDINGS: CT CHEST FINDINGS

Cardiovascular: Right Port-A-Cath tip:  Cavoatrial junction.

Proximal left anterior descending coronary artery atherosclerosis.

Mediastinum/Nodes: Unremarkable

Lungs/Pleura: 4 mm in diameter right upper lobe pulmonary nodule,
image 73/4, stable.

3 mm left upper lobe pulmonary nodule on image 54/4, stable.

No new pulmonary nodule.

Musculoskeletal: Unremarkable

CT ABDOMEN PELVIS FINDINGS

Hepatobiliary: The hypodense caudate lobe lesion measures 2.4 by
cm on image 56/2 and is difficult to separate from adjacent porta
hepatis adenopathy. By my measurements the caudate lobe lesion was
previously the same size.

A subtle lesion posteriorly in the right hepatic lobe measuring
by 0.6 cm on image 58/2 is less conspicuous than previous but stable
in size.

The bandlike hypodensity in segment 4 of the liver shown on the
prior exam is not conspicuous today.

The hypodense lesion at the junction of segment 5 and 6 of the liver
inferiorly on the prior exam is not conspicuous today

No new liver lesions identified.  Gallbladder unremarkable.

Pancreas: Unremarkable

Spleen: Unremarkable

Adrenals/Urinary Tract: Unremarkable

Stomach/Bowel: Prominent stool throughout the colon favors
constipation.

Vascular/Lymphatic: Aortoiliac atherosclerotic vascular disease.
Indistinctly marginated porta hepatis soft tissue density interposed
between the celiac trunk, portal vein, IVC, and right
hemidiaphragmatic crus measures approximately 2.6 by 3.5 cm, stable.
This was previously more bulky in appearance back on 07/15/2016.

Aortocaval indistinctly marginated lymph node at 1.3 cm in short
axis on image 65/2, formerly 1.2 cm.

Reproductive: Fibroid uterus. Complex but hypodense left adnexal
mass 6.7 by 4.1 cm, formerly 5.1 by 3.7 cm by my measurements.

Other: Nodularity in the left lower quadrant along the omentum
including a 10 mm nodule on image 103/2 and several smaller nodules
just cephalad to this. No other omental nodularity is observed.
Overall the left lower quadrant omental nodularity is minimally
increased.

Musculoskeletal: Unremarkable
IMPRESSION: 1. The dominant caudate lobe mass is stable in size and appearance,
as is the indistinct adenopathy in the porta hepatis adjacent to the
caudate lobe, and in the retroperitoneum. Several subtle additional
liver lesions are slightly less conspicuous on today' s exam. The
pulmonary nodules are stable, and mild omental nodularity in the
left lower quadrant is very slightly worsened compared to prior.
Overall the burden of malignancy appears similar to prior, and some
of the findings may represent effectively treated metastatic
lesions.
2. Complex left adnexal mass, 6.7 by 4.1 cm, enlarging. Pelvic
sonography recommended for further characterization.
3. Other imaging findings of potential clinical significance: Aortic
Atherosclerosis ([id number]-AV3.3). Coronary atherosclerosis. Prominent
stool throughout the colon favors constipation. Fibroid uterus.

## 2017-12-04 ENCOUNTER — Other Ambulatory Visit: Payer: Self-pay | Admitting: Hematology

## 2017-12-04 ENCOUNTER — Emergency Department (HOSPITAL_COMMUNITY)
Admission: EM | Admit: 2017-12-04 | Discharge: 2017-12-04 | Disposition: A | Discharge status: Home / Self Care | Payer: Federal, State, Local not specified - PPO | Attending: Emergency Medicine | Admitting: Emergency Medicine

## 2017-12-04 ENCOUNTER — Encounter (HOSPITAL_COMMUNITY): Payer: Self-pay | Admitting: Emergency Medicine

## 2017-12-04 ENCOUNTER — Emergency Department (HOSPITAL_COMMUNITY): Payer: Federal, State, Local not specified - PPO

## 2017-12-04 DIAGNOSIS — R569 Unspecified convulsions: Secondary | ICD-10-CM

## 2017-12-04 DIAGNOSIS — C221 Intrahepatic bile duct carcinoma: Secondary | ICD-10-CM

## 2017-12-04 DIAGNOSIS — R630 Anorexia: Secondary | ICD-10-CM

## 2017-12-04 DIAGNOSIS — Z7901 Long term (current) use of anticoagulants: Secondary | ICD-10-CM | POA: Diagnosis not present

## 2017-12-04 DIAGNOSIS — Z79899 Other long term (current) drug therapy: Secondary | ICD-10-CM | POA: Diagnosis not present

## 2017-12-04 DIAGNOSIS — R110 Nausea: Secondary | ICD-10-CM

## 2017-12-04 LAB — CBC
HCT: 32.9 % — ABNORMAL LOW (ref 36.0–46.0)
Hemoglobin: 10.8 g/dL — ABNORMAL LOW (ref 12.0–15.0)
MCH: 27.7 pg (ref 26.0–34.0)
MCHC: 32.8 g/dL (ref 30.0–36.0)
MCV: 84.4 fL (ref 78.0–100.0)
Platelets: 368 10*3/uL (ref 150–400)
RBC: 3.9 MIL/uL (ref 3.87–5.11)
RDW: 17.1 % — ABNORMAL HIGH (ref 11.5–15.5)
WBC: 8.6 10*3/uL (ref 4.0–10.5)

## 2017-12-04 LAB — COMPREHENSIVE METABOLIC PANEL
ALT: 16 U/L (ref 14–54)
AST: 31 U/L (ref 15–41)
Albumin: 3.1 g/dL — ABNORMAL LOW (ref 3.5–5.0)
Alkaline Phosphatase: 59 U/L (ref 38–126)
Anion gap: 14 (ref 5–15)
BUN: 9 mg/dL (ref 6–20)
CO2: 26 mmol/L (ref 22–32)
Calcium: 8.5 mg/dL — ABNORMAL LOW (ref 8.9–10.3)
Chloride: 91 mmol/L — ABNORMAL LOW (ref 101–111)
Creatinine, Ser: 0.73 mg/dL (ref 0.44–1.00)
GFR calc Af Amer: >60 mL/min (ref >60)
GFR calc non Af Amer: >60 mL/min (ref >60)
Glucose, Bld: 100 mg/dL — ABNORMAL HIGH (ref 65–99)
Potassium: 3 mmol/L — ABNORMAL LOW (ref 3.5–5.1)
Sodium: 131 mmol/L — ABNORMAL LOW (ref 135–145)
Total Bilirubin: 1.5 mg/dL — ABNORMAL HIGH (ref 0.3–1.2)
Total Protein: 6.2 g/dL — ABNORMAL LOW (ref 6.5–8.1)

## 2017-12-04 LAB — LIPASE, BLOOD: Lipase: 24 U/L (ref 11–51)

## 2017-12-04 MED ORDER — SODIUM CHLORIDE 0.9 % IV BOLUS
1000.0000 mL | Freq: Once | INTRAVENOUS | Status: AC
  Administered 2017-12-04: 1000 mL via INTRAVENOUS

## 2017-12-04 MED ORDER — LEVETIRACETAM IN NACL 1000 MG/100ML IV SOLN
1000.0000 mg | Freq: Once | INTRAVENOUS | Status: AC
  Administered 2017-12-04: 1000 mg via INTRAVENOUS
  Filled 2017-12-04: qty 100

## 2017-12-04 MED ORDER — LEVETIRACETAM 100 MG/ML PO SOLN: 500.0000 mg | Freq: Two times a day (BID) | ORAL | 12 refills | Status: AC

## 2017-12-04 MED ORDER — HEPARIN SOD (PORK) LOCK FLUSH 100 UNIT/ML IV SOLN
500.0000 [IU] | Freq: Once | INTRAVENOUS | Status: AC
  Administered 2017-12-04: 500 [IU]
  Filled 2017-12-04: qty 5

## 2017-12-04 NOTE — ED Notes (Signed)
Patient port accessed. Blood return noted. Antimicrobial disc in place. Site clean, dry, and intact.

## 2017-12-04 NOTE — ED Notes (Signed)
[facility] called for transport.  

## 2017-12-04 NOTE — ED Notes (Signed)
ED Provider at bedside. 

## 2017-12-04 NOTE — ED Triage Notes (Signed)
Pt has stage 4 cancer and hasnt had any treatments since Feb when developed blockage in bile duct. Patient had 2 seizures during the night and when spouse called cancer center hot line they were instructed to go to ED for ruling out stroke. Pt has been very tired since seizures.

## 2017-12-04 NOTE — ED Notes (Signed)
Patient transported to CT 

## 2017-12-04 NOTE — ED Provider Notes (Signed)
[facility] DEPT Provider Note   CSN: [id number] Arrival date & time: 12/04/17  1123     History   Chief Complaint Chief Complaint  Patient presents with  . Seizures    Level 5 caveat: Altered mental status  HPI Nancy [name] is a 52 y.o. female.  HPI 52 year old female with a history of advanced cholangiocarcinoma with peritoneal implants who is brought to the emergency department by her husband who is very knowledgeable of her current treatment and recent diagnoses with new seizure-like activity x2 last night and this morning.  She has had one other seizure a month ago at which point she was seen by neurology and was not initiated on antiepileptics.  Over the last month she has had no seizure-like activity but has had decline in her overall course including decreased oral intake.  Last night she had a tonic-clonic seizure which lasted several minutes followed by a 15-20-minute postictal period this occurred again this morning and thus the patient was brought to the ER for further evaluation.  Family reports that the patient is somewhat more somnolent at this time.  Recent discussion between the patient's family and the oncologist has been switching to a more palliative discussion given her advancing disease.  She has stopped all treatments in the past month   Past Medical History:  Diagnosis Date  . Allergy    SEASONAL  . Anemia    [employer]CHOOL  . Anxiety   . Atopic eczema   . cholangio ca dx'd 07/12/16  . Depression   . Family history of adverse reaction to anesthesia    Mother has allergic reactions stops breathing  . Fibrocystic breast disease   . GERD (gastroesophageal reflux disease)   . Hx of migraines    seasonal   . Personal history of chemotherapy   . Pneumonia    2012  . TIA (transient ischemic attack)    Questionable migraine    Patient Active Problem List   Diagnosis Date Noted  . Leg DVT (deep venous thromboembolism),  acute, left ([place]) 11/14/2017  . Bile duct obstruction   . Moderate protein-calorie malnutrition ([place])   . Nausea & vomiting 08/09/2017  . Abdominal pain 08/09/2017  . Nausea and vomiting 08/09/2017  . Hypokalemia   . Acute neoplasm-related pain 07/29/2017  . Abdominal pain, acute, left upper quadrant 07/29/2017  . Dehydration 07/29/2017  . Oral candidiasis 07/19/2017  . Neoplasm related pain 07/19/2017  . Anemia due to antineoplastic chemotherapy 02/13/2017  . Goals of care, counseling/discussion 10/04/2016  . Cerumen impaction 09/16/2016  . Abnormal finding on GI tract imaging   . Metastatic cholangiocarcinoma to lung ([place])   . Acute bronchitis 10/01/2015  . TIA (transient ischemic attack) 06/12/2015  . Hyperlipidemia LDL goal <70 06/12/2015  . Diplopia 06/09/2015  . Vertigo 06/09/2015  . Right leg pain 04/12/2014  . Hyperthyroidism 03/13/2014  . MAMMOGRAM, ABNORMAL 10/31/2008  . ECZEMA, ATOPIC 10/15/2008  . FIBROCYSTIC BREAST DISEASE, HX OF 10/15/2008    Past Surgical History:  Procedure Laterality Date  . ENDOSCOPIC RETROGRADE CHOLANGIOPANCREATOGRAPHY (ERCP) WITH PROPOFOL N/A 10/20/2017   Procedure: ENDOSCOPIC RETROGRADE CHOLANGIOPANCREATOGRAPHY (ERCP) WITH PROPOFOL;  Surgeon: [name], MD;  Location: [facility] ENDOSCOPY;  Service: Endoscopy;  Laterality: N/A;  . EUS N/A 07/07/2016   Procedure: UPPER ENDOSCOPIC ULTRASOUND (EUS) RADIAL;  Surgeon: [name], MD;  Location: [facility] ENDOSCOPY;  Service: Endoscopy;  Laterality: N/A;  . IR GENERIC HISTORICAL  08/03/2016   IR US GUIDE VASC  ACCESS RIGHT 08/03/2016 Corrie Mckusick, DO [facility]-INTER[facility]  . IR GENERIC HISTORICAL  08/03/2016   IR FLUORO GUIDE PORT INSERTION RIGHT 08/03/2016 Corrie Mckusick, DO [facility]  . MYOMECTOMY  2002     OB History   None      Home Medications    Prior to Admission medications   Medication Sig Start Date End Date Taking? Authorizing Provider  ALPRAZolam Duanne Moron) 0.5 MG tablet Take 1 tablet (0.5  mg total) by mouth 3 (three) times daily as needed for anxiety. 10/17/17  Yes Truitt Merle, MD  cetirizine (ZYRTEC) 10 MG tablet Take 10 mg by mouth daily as needed for allergies. For allergies    Yes [provider]  Cholecalciferol (VITAMIN D3) 5000 UNITS CAPS Take 5,000 Units by mouth daily.    Yes [provider]  COLOSTRUM PO Take 1.25 g by mouth 2 (two) times daily.    Yes [provider]  dicyclomine (BENTYL) 20 MG tablet Take 20 mg by mouth 3 (three) times daily as needed for spasms.  07/22/16  Yes [provider]  dronabinol (MARINOL) 5 MG capsule Take 1 capsule (5 mg total) by mouth 2 (two) times daily before a meal. 10/31/17  Yes Truitt Merle, MD  enoxaparin (LOVENOX) 80 MG/0.8ML injection Inject 0.8 mLs (80 mg total) into the skin daily. 11/14/17  Yes Truitt Merle, MD  fluconazole (DIFLUCAN) 100 MG tablet Take 1 tablet (100 mg total) by mouth daily. 11/28/17  Yes Truitt Merle, MD  lidocaine-prilocaine (EMLA) cream Apply 1 application topically as needed. Apply to portacath 1 1/2 hours - 2 hours prior to procedures as needed. 08/12/17  Yes Truitt Merle, MD  megestrol (MEGACE) 400 MG/10ML suspension Take 10 mLs (400 mg total) by mouth daily. Patient taking differently: Take 400 mg by mouth as needed.  11/14/17  Yes Truitt Merle, MD  Menaquinone-7 (VITAMIN K2) 40 MCG TABS Take 40 mg by mouth daily.    Yes [provider]  omeprazole (PRILOSEC) 40 MG capsule Take 1 capsule (40 mg total) by mouth 2 (two) times daily before a meal. 11/14/17  Yes Truitt Merle, MD  ondansetron (ZOFRAN) 8 MG tablet Take 1 tablet (8 mg total) by mouth every 8 (eight) hours as needed for nausea or vomiting. 10/31/17  Yes Alla Feeling, NP  oxyCODONE (OXYCONTIN) 40 mg 12 hr tablet Take 1 tablet (40 mg total) by mouth every 12 (twelve) hours. 11/28/17  Yes Truitt Merle, MD  Oxycodone HCl 10 MG TABS Take 1 tablet (10 mg total) by mouth every 3 (three) hours as needed. 11/28/17  Yes Truitt Merle, MD  polyethylene  glycol [facility] Floria Raveling) packet Take 17 g by mouth 2 (two) times daily. Hold if you develop diarrhea Patient taking differently: Take 17 g by mouth daily as needed for mild constipation. Hold if you develop diarrhea 08/01/17  Yes Eugenie Filler, MD  prenatal vitamin w/FE, FA (PRENATAL 1 + 1) 27-1 MG TABS tablet Take 1 tablet by mouth daily at 12 noon.   Yes [provider]  scopolamine (TRANSDERM-SCOP) 1 MG/3DAYS Place 1 patch (1.5 mg total) onto the skin every 3 (three) days. Patient taking differently: Place 1 patch onto the skin See admin instructions. Every 72 hours as needed 10/10/17  Yes Tanner, Lyndon Code., PA-C  senna (SENOKOT) 8.6 MG TABS tablet Take 2 tablets (17.2 mg total) by mouth 2 (two) times daily. Patient taking differently: Take 2 tablets by mouth 2 (two) times daily as needed  for mild constipation.  08/01/17  Yes Eugenie Filler, MD  vitamin C (ASCORBIC ACID) 500 MG tablet Take 500 mg by mouth daily.   Yes [provider]  clotrimazole (MYCELEX) 10 MG troche Take 1 tablet (10 mg total) by mouth 5 (five) times daily. Patient not taking: Reported on 12/04/2017 11/07/17   Alla Feeling, NP  levETIRAcetam (KEPPRA) 100 MG/ML solution Take 5 mLs (500 mg total) by mouth 2 (two) times daily. 12/04/17   Jola Schmidt, MD  LORazepam (ATIVAN) 0.5 MG tablet Place under tongue and dissolve every 8 hours as needed for nausea. Do not take Xanax while taking this medication. 10/03/17   Alla Feeling, NP  mometasone (ELOCON) 0.1 % lotion Apply topically daily. Patient not taking: Reported on 12/04/2017 08/21/15   Carollee Herter, Alferd Apa, DO  promethazine (PHENERGAN) 25 MG tablet Take 1 tablet (25 mg total) by mouth every 6 (six) hours as needed for nausea or vomiting. 05/18/17   Harle Stanford., PA-C    Family History Family History  Problem Relation Age of Onset  . Gout Mother   . Diabetes Mother   . Hypertension Father   . Diabetes Father   . Breast cancer Paternal Aunt   .  Cancer Maternal Grandfather        throat cancer   . Cancer Cousin 17       GI cancer   . Thyroid disease Neg Hx     Social History Social History   Tobacco Use  . Smoking status: Never Smoker  . Smokeless tobacco: Never Used  Substance Use Topics  . Alcohol use: No    Frequency: Never  . Drug use: No     Allergies   Penicillins; Contrast media [iodinated diagnostic agents]; and Lipitor [atorvastatin]   Review of Systems Review of Systems  Unable to perform ROS: Mental status change     Physical Exam Updated Vital Signs BP (!) 176/108   Pulse (!) 104   Temp 99.3 F (37.4 C) (Rectal)   Resp 13   LMP 05/20/2016   SpO2 100%   Physical Exam  Constitutional: She appears well-developed and well-nourished. No distress.  HENT:  Head: Normocephalic and atraumatic.  Eyes: Pupils are equal, round, and reactive to light. EOM are normal.  Neck: Normal range of motion.  No meningeal signs  Cardiovascular: Normal rate, regular rhythm and normal heart sounds.  Pulmonary/Chest: Effort normal and breath sounds normal.  Abdominal: Soft. She exhibits no distension. There is no tenderness.  Musculoskeletal: Normal range of motion.  Neurological:  His eyes to voice.  Answers simple commands.  Moves all 4 extremities.  Skin: Skin is warm and dry.  Psychiatric: Judgment normal.  Nursing note and vitals reviewed.    ED Treatments / Results  Labs (all labs ordered are listed, but only abnormal results are displayed) Labs Reviewed  CBC - Abnormal; Notable for the following components:      Result Value   Hemoglobin 10.8 (*)    HCT 32.9 (*)    RDW 17.1 (*)    All other components within normal limits  COMPREHENSIVE METABOLIC PANEL - Abnormal; Notable for the following components:   Sodium 131 (*)    Potassium 3.0 (*)    Chloride 91 (*)    Glucose, Bld 100 (*)    Calcium 8.5 (*)    Total Protein 6.2 (*)    Albumin 3.1 (*)    Total Bilirubin 1.5 (*)  All other  components within normal limits  LIPASE, BLOOD    EKG None  Radiology Ct Head Wo Contrast  Result Date: 12/04/2017 CLINICAL DATA:  Pt has stage 4 cancer and hasnt had any treatments since Feb when developed blockage in bile duct. Patient had 2 seizures during the night and when spouse called cancer center hot line they were instructed to go to ED for ruling out stroke. Pt has been very tired since seizures EXAM: CT HEAD WITHOUT CONTRAST TECHNIQUE: Contiguous axial images were obtained from the base of the skull through the vertex without intravenous contrast. COMPARISON:  06/09/2015 FINDINGS: Brain: No evidence of acute infarction, hemorrhage, hydrocephalus, extra-axial collection or mass lesion/mass effect. Mild subcortical white matter hypoattenuation in the superior parietal lobes, nonspecific but likely minor chronic microvascular ischemic change. Vascular: No hyperdense vessel or unexpected calcification. Skull: Normal. Negative for fracture or focal lesion. Sinuses/Orbits: Visualize globes and orbits are unremarkable. Visualized sinuses and mastoid air cells are clear. Other: None. IMPRESSION: 1. No acute intracranial abnormalities. 2. Minor superior parietal lobe subcortical white matter hypoattenuation likely due to chronic microvascular ischemic change. This is new since the prior head CT. Electronically Signed   By: Lajean Manes M.D.   On: 12/04/2017 14:41    Procedures Procedures (including critical care time)  Medications Ordered in ED Medications  sodium chloride 0.9 % bolus 1,000 mL (0 mLs Intravenous Stopped 12/04/17 1523)  levETIRAcetam (KEPPRA) IVPB 1000 mg/100 mL premix (0 mg Intravenous Stopped 12/04/17 1440)  heparin lock flush 100 unit/mL (500 Units Intracatheter Given 12/04/17 1700)     Initial Impression / Assessment and Plan / ED Course  I have reviewed the triage vital signs and the nursing notes.  Pertinent labs & imaging results that were available during my care of  the patient were reviewed by me and considered in my medical decision making (see chart for details).     Patient given IV Keppra here in the emergency department.  She will be started on 500 mg p.o. twice daily Keppra for what appears to be seizures and epilepsy given 2 episodes today and one episode a month ago.  Given her advancing cancer disease and her rapid decline a long discussion was had with the patient's husband regarding palliative care/hospice resources.  These will be requested by the patient to his oncology team.  Family is fully focused on comfort measures at this time since stopping chemotherapy over a month ago.  They understand her poor prognosis and have witnessed a rapid decline.  I do not think she needs outpatient neurology follow-up or MRI as this will not likely change her long-term outcome.  She may need her Keppra adjusted if she continues to have seizures.  Final Clinical Impressions(s) / ED Diagnoses   Final diagnoses:  Seizure ([place])  Cholangiocarcinoma [facility])    ED Discharge Orders        Ordered    levETIRAcetam (KEPPRA) 100 MG/ML solution  2 times daily     12/04/17 1640       Jola Schmidt, MD 12/04/17 1706

## 2017-12-05 ENCOUNTER — Telehealth: Payer: Self-pay | Admitting: *Deleted

## 2017-12-05 ENCOUNTER — Telehealth: Payer: Self-pay | Admitting: Hematology

## 2017-12-05 NOTE — Telephone Encounter (Signed)
Nancy [name] had seizure activity over the weekend, was evaluated in the emergency room yesterday and released home.  I have reviewed her chart.  I called her husband Octavia Bruckner and discussed hospice.  He is in full agreement with hospice care.  I will make a referral today.  Truitt Merle  12/05/2017 8:20 AM

## 2017-12-05 NOTE — Telephone Encounter (Signed)
Called & made Hospice Referral per dr Ernestina Penna orders to [facility].  Will fax demographic info, med list, H & P & last MD note to [phone].

## 2017-12-06 ENCOUNTER — Telehealth: Payer: Self-pay | Admitting: *Deleted

## 2017-12-06 NOTE — Telephone Encounter (Signed)
Received call from Robyn Haber, RN @ [facility] re:  Pt wants to be DNR now.   Wells Guiles stated hospice nurse will give pt form to sign.   Informed Wells Guiles that Dr. Burr Medico will be fine with what hospice decides as appropriate for pt - goal is for pt's comfort. Wells Guiles voiced understanding. Rebecca's    Phone      [phone].

## 2017-12-07 ENCOUNTER — Encounter: Payer: Self-pay | Admitting: Hematology

## 2017-12-07 NOTE — Progress Notes (Signed)
Stacy from [employer] support called to obtain patient's insurance id number. Provided this information after Marzetta Board confirmed patient's name, date of birth and address.

## 2017-12-12 ENCOUNTER — Telehealth: Payer: Self-pay | Admitting: *Deleted

## 2017-12-12 ENCOUNTER — Ambulatory Visit: Payer: Federal, State, Local not specified - PPO

## 2017-12-12 ENCOUNTER — Encounter: Payer: Federal, State, Local not specified - PPO | Admitting: Nutrition

## 2017-12-12 ENCOUNTER — Other Ambulatory Visit: Payer: Federal, State, Local not specified - PPO

## 2017-12-12 ENCOUNTER — Ambulatory Visit: Payer: Federal, State, Local not specified - PPO | Admitting: Hematology

## 2017-12-12 NOTE — Telephone Encounter (Signed)
Called Nancy [name] and left message on voice mail informing Nancy [name] NOT to come in for appts today with Dr. Burr Medico.  Nancy [name] is with [facility].   Left message that if Nancy [name] absolutely wanted to come in, it will be fine with Dr. Burr Medico; otherwise, appts will be cancelled.

## 2017-12-12 NOTE — Telephone Encounter (Signed)
Voicemail forwarded in reference to pain medications.

## 2017-12-19 ENCOUNTER — Other Ambulatory Visit: Payer: Self-pay | Admitting: Hematology

## 2017-12-19 ENCOUNTER — Telehealth: Payer: Self-pay

## 2017-12-19 ENCOUNTER — Other Ambulatory Visit: Payer: Self-pay | Admitting: Nurse Practitioner

## 2017-12-19 NOTE — Telephone Encounter (Signed)
Judeen Hammans with [facility] called requesting script for Nystatin be sent in as patient has thrush.    [facility] [phone]

## 2017-12-19 NOTE — Telephone Encounter (Signed)
Sherry with [facility] called after visit with patient today her pain has increased. Patient currently is on Fentanyl 75 mcg patch and taking Oxycodone 20 mg every 3 hours ATC. She is requesting the Fentanyl patch be increased to 150 mcg.   [employer] [phone]

## 2017-12-19 NOTE — Telephone Encounter (Signed)
Done, I gave Nancy [name] a verbal phone order.  Thanks, Regan Rakers

## 2017-12-20 NOTE — Telephone Encounter (Signed)
Please call hospcie nurse, I did not prescribe fentanyl path before. I would like to speak to her. Thanks   Truitt Merle MD

## 2017-12-21 ENCOUNTER — Telehealth: Payer: Self-pay | Admitting: Hematology

## 2017-12-21 NOTE — Telephone Encounter (Signed)
Called Nancy [name] and left voice message for her to return Dr. Ernestina Penna phone call.

## 2017-12-21 NOTE — Telephone Encounter (Signed)
We have left a several message to the hospice team but no return call yet. I called pt's husband and I was informed that her hospice team has prescribted initially and increased her fentanyl path to 121mcg/hr on 4/22 and her pain overall is well controlled. Tim appreciated the call.  Truitt Merle  12/21/2017

## 2018-01-10 ENCOUNTER — Telehealth: Payer: Self-pay

## 2018-01-10 NOTE — Telephone Encounter (Signed)
Faxed signed orders to [facility].

## 2018-01-28 DEATH — deceased

## 2018-04-21 IMAGING — DX DG ABDOMEN ACUTE W/ 1V CHEST
4 series · 4 of 4 positions shown · non-contrast
Comparison: 08/21/2017

CLINICAL DATA: Abdominal pain , Nausea, Vomiting. Chemo treatment x
2 weeks HX Colon CA. Husband signed pregnancy waiver due patient
inability to sign due to pain meds given.

EXAM:
DG ABDOMEN ACUTE W/ 1V CHEST

[chest pa]
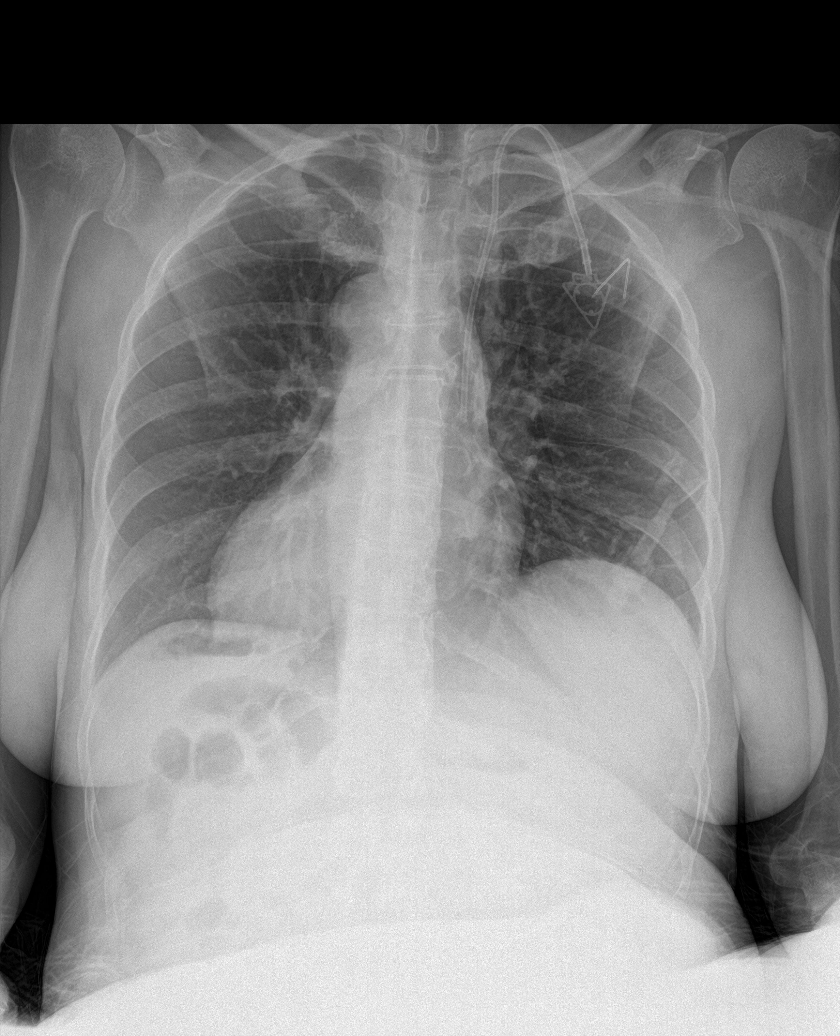

[abdomen supine]
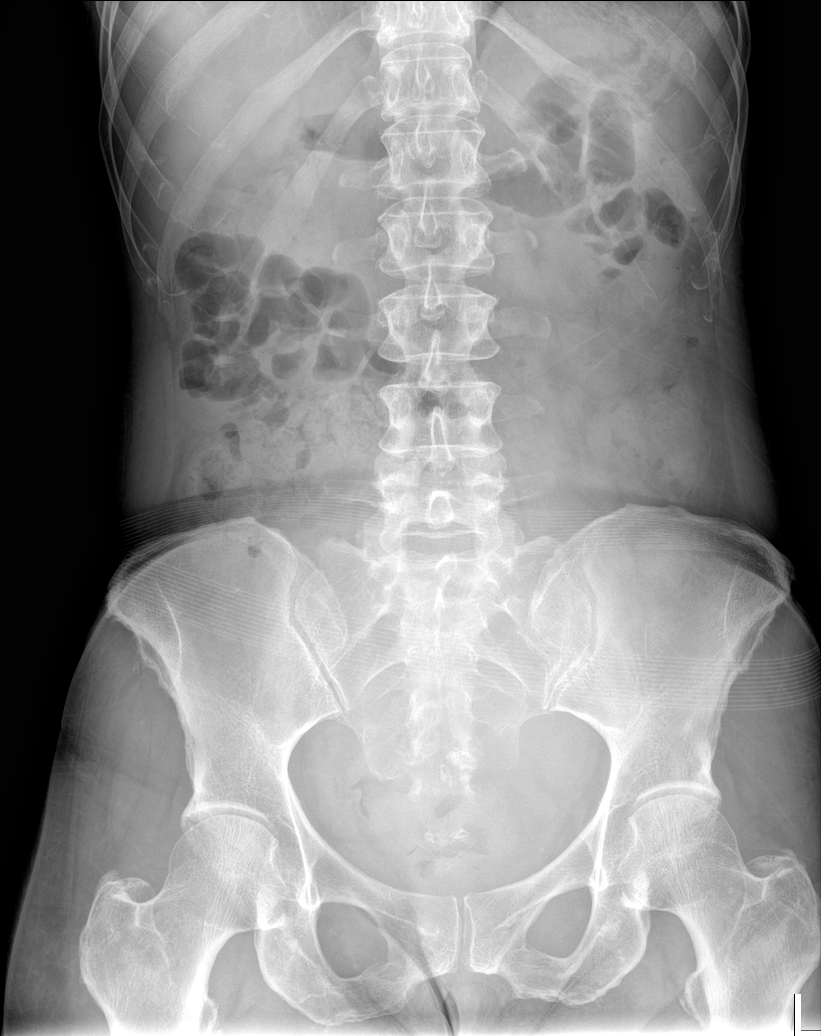

[abdomen erect (1 of 2)]
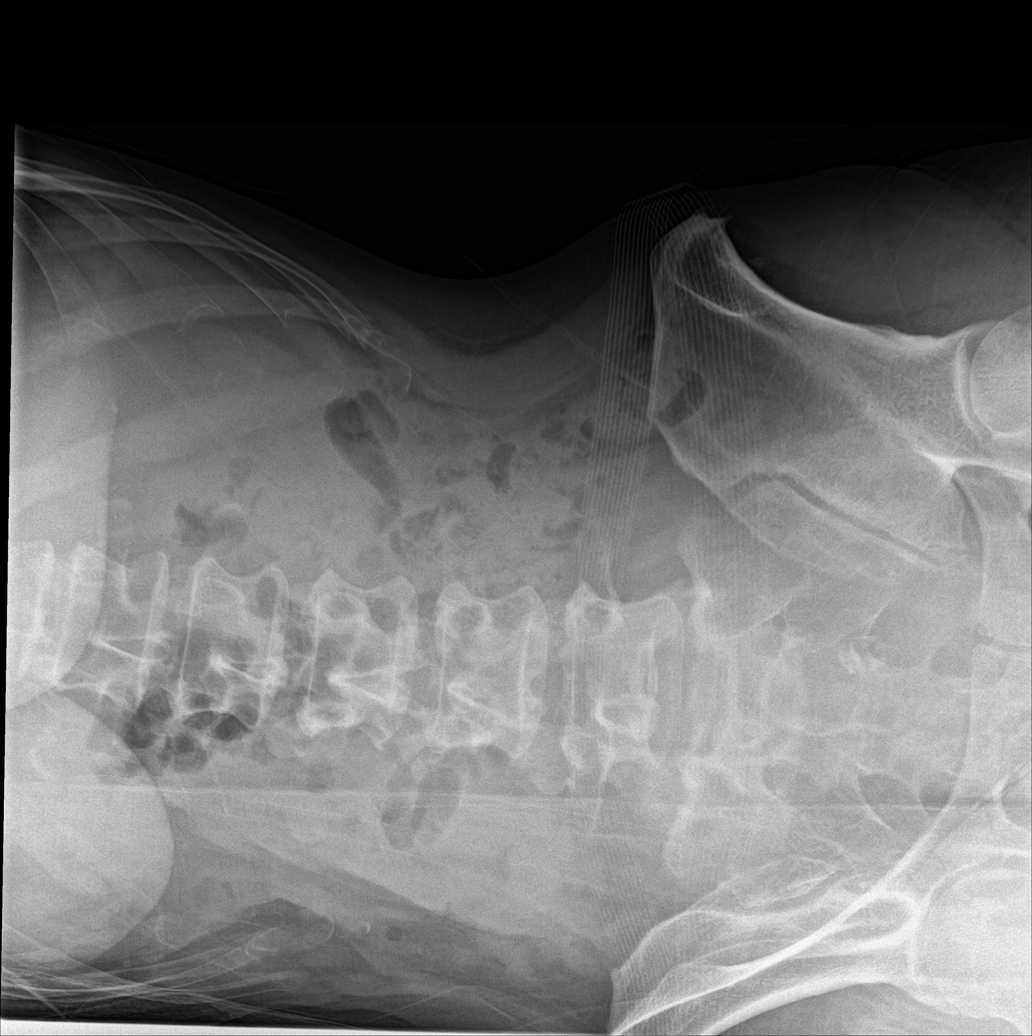

[abdomen erect (2 of 2)]
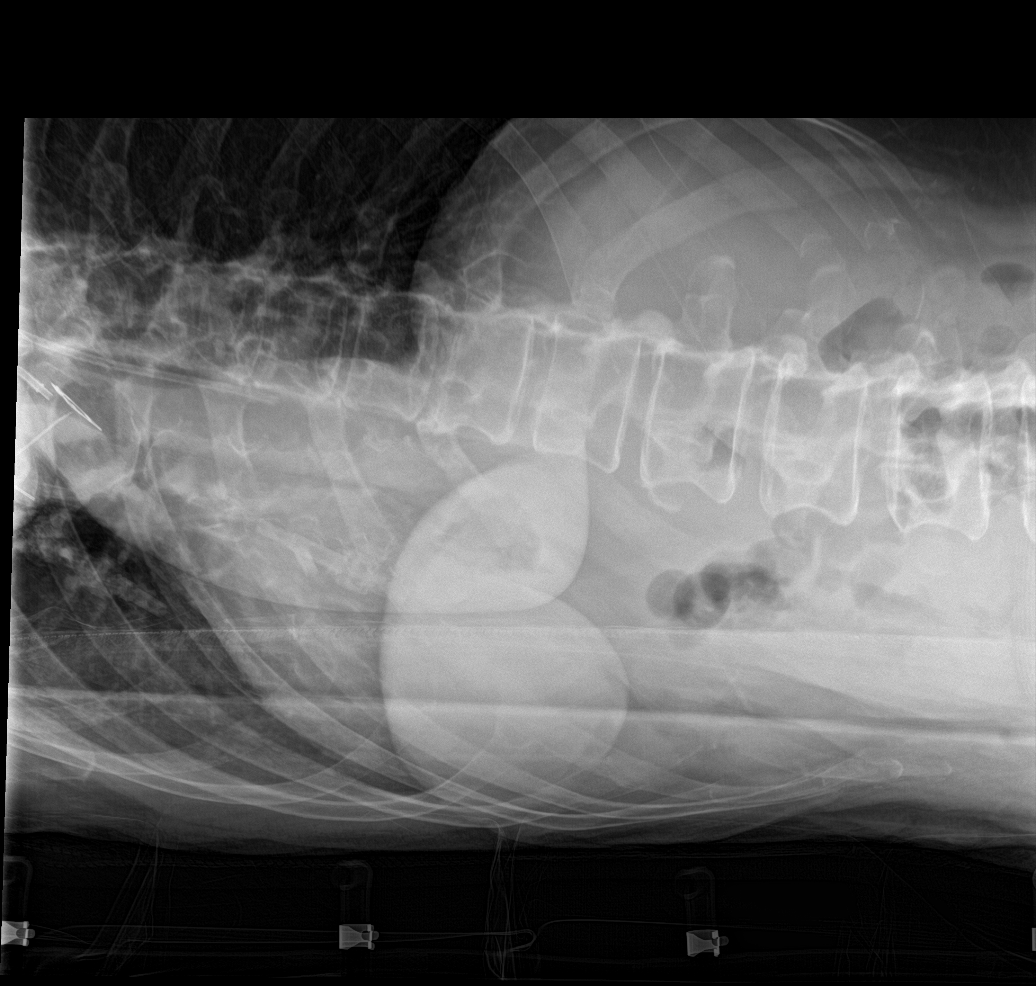

[4 of 4 positions shown; findings below may reference images not displayed]

FINDINGS: Power port type central venous catheter with tip over the low SVC
region. No pneumothorax. No change in position. Shallow inspiration.
Heart size and pulmonary vascularity are normal. Lungs are clear. No
blunting of costophrenic angles. No pneumothorax.

Gas and stool throughout the colon. No small or large bowel
distention. No free intra-abdominal air. No abnormal air-fluid
levels. No radiopaque stones. Small focal calcification in the
pelvis probably represents a calcified fibroid. Soft tissue contours
are unremarkable. Visualized bones appear intact.
IMPRESSION: No evidence of active pulmonary disease. Normal nonobstructive bowel
gas pattern.
